# Patient Record
Sex: Female | Born: 1990 | Race: Black or African American | Hispanic: No | Smoking: Former smoker
Health system: Southern US, Community
[De-identification: ages and names within clinical notes are randomized; demographics above are authoritative.]

## PROBLEM LIST (undated history)

## (undated) DIAGNOSIS — E668 Other obesity: Secondary | ICD-10-CM

## (undated) DIAGNOSIS — G4733 Obstructive sleep apnea (adult) (pediatric): Secondary | ICD-10-CM

## (undated) DIAGNOSIS — F99 Mental disorder, not otherwise specified: Secondary | ICD-10-CM

## (undated) DIAGNOSIS — J45909 Unspecified asthma, uncomplicated: Secondary | ICD-10-CM

## (undated) DIAGNOSIS — E1165 Type 2 diabetes mellitus with hyperglycemia: Secondary | ICD-10-CM

## (undated) DIAGNOSIS — E669 Obesity, unspecified: Secondary | ICD-10-CM

## (undated) DIAGNOSIS — I10 Essential (primary) hypertension: Secondary | ICD-10-CM

## (undated) DIAGNOSIS — Z8489 Family history of other specified conditions: Secondary | ICD-10-CM

## (undated) DIAGNOSIS — IMO0002 Reserved for concepts with insufficient information to code with codable children: Secondary | ICD-10-CM

## (undated) DIAGNOSIS — F329 Major depressive disorder, single episode, unspecified: Secondary | ICD-10-CM

## (undated) DIAGNOSIS — F32A Depression, unspecified: Secondary | ICD-10-CM

## (undated) HISTORY — PX: APPENDECTOMY: SHX54

## (undated) HISTORY — PX: TONSILLECTOMY: SUR1361

---
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
## 2014-09-04 ENCOUNTER — Emergency Department (HOSPITAL_COMMUNITY): Payer: Self-pay

## 2014-09-04 ENCOUNTER — Encounter (HOSPITAL_COMMUNITY): Payer: Self-pay | Admitting: *Deleted

## 2014-09-04 ENCOUNTER — Inpatient Hospital Stay (HOSPITAL_COMMUNITY)
Admission: EM | Admit: 2014-09-04 | Discharge: 2014-09-06 | DRG: 418 | Disposition: A | Discharge status: Home / Self Care | Payer: Self-pay | Attending: General Surgery | Admitting: General Surgery

## 2014-09-04 DIAGNOSIS — E1165 Type 2 diabetes mellitus with hyperglycemia: Secondary | ICD-10-CM | POA: Diagnosis present

## 2014-09-04 DIAGNOSIS — E668 Other obesity: Secondary | ICD-10-CM | POA: Diagnosis present

## 2014-09-04 DIAGNOSIS — F329 Major depressive disorder, single episode, unspecified: Secondary | ICD-10-CM | POA: Diagnosis present

## 2014-09-04 DIAGNOSIS — K8044 Calculus of bile duct with chronic cholecystitis without obstruction: Principal | ICD-10-CM | POA: Diagnosis present

## 2014-09-04 DIAGNOSIS — E669 Obesity, unspecified: Secondary | ICD-10-CM | POA: Diagnosis present

## 2014-09-04 DIAGNOSIS — K8050 Calculus of bile duct without cholangitis or cholecystitis without obstruction: Secondary | ICD-10-CM | POA: Diagnosis present

## 2014-09-04 DIAGNOSIS — J45909 Unspecified asthma, uncomplicated: Secondary | ICD-10-CM | POA: Diagnosis present

## 2014-09-04 DIAGNOSIS — Z9049 Acquired absence of other specified parts of digestive tract: Secondary | ICD-10-CM | POA: Diagnosis present

## 2014-09-04 DIAGNOSIS — IMO0002 Reserved for concepts with insufficient information to code with codable children: Secondary | ICD-10-CM | POA: Diagnosis present

## 2014-09-04 DIAGNOSIS — G4733 Obstructive sleep apnea (adult) (pediatric): Secondary | ICD-10-CM | POA: Diagnosis present

## 2014-09-04 DIAGNOSIS — Z6841 Body Mass Index (BMI) 40.0 and over, adult: Secondary | ICD-10-CM

## 2014-09-04 HISTORY — DX: Unspecified asthma, uncomplicated: J45.909

## 2014-09-04 HISTORY — DX: Type 2 diabetes mellitus with hyperglycemia: E11.65

## 2014-09-04 HISTORY — DX: Obstructive sleep apnea (adult) (pediatric): G47.33

## 2014-09-04 HISTORY — DX: Other obesity: E66.8

## 2014-09-04 HISTORY — DX: Obesity, unspecified: E66.9

## 2014-09-04 HISTORY — DX: Reserved for concepts with insufficient information to code with codable children: IMO0002

## 2014-09-04 LAB — URINALYSIS, ROUTINE W REFLEX MICROSCOPIC
Glucose, UA: NEGATIVE mg/dL
Ketones, ur: NEGATIVE mg/dL
Leukocytes, UA: NEGATIVE
Nitrite: NEGATIVE
Protein, ur: 100 mg/dL — AB
Specific Gravity, Urine: 1.029 (ref 1.005–1.030)
Urobilinogen, UA: 0.2 mg/dL (ref 0.0–1.0)
pH: 5.5 (ref 5.0–8.0)

## 2014-09-04 LAB — COMPREHENSIVE METABOLIC PANEL
ALT: 25 U/L (ref 0–35)
AST: 19 U/L (ref 0–37)
Albumin: 3.1 g/dL — ABNORMAL LOW (ref 3.5–5.2)
Alkaline Phosphatase: 63 U/L (ref 39–117)
Anion gap: 6 (ref 5–15)
BUN: 6 mg/dL (ref 6–23)
CO2: 29 mmol/L (ref 19–32)
Calcium: 8.8 mg/dL (ref 8.4–10.5)
Chloride: 104 mmol/L (ref 96–112)
Creatinine, Ser: 0.75 mg/dL (ref 0.50–1.10)
GFR calc Af Amer: >90 mL/min (ref >90)
GFR calc non Af Amer: >90 mL/min (ref >90)
Glucose, Bld: 142 mg/dL — ABNORMAL HIGH (ref 70–99)
Potassium: 3.9 mmol/L (ref 3.5–5.1)
Sodium: 139 mmol/L (ref 135–145)
Total Bilirubin: 0.4 mg/dL (ref 0.3–1.2)
Total Protein: 6.7 g/dL (ref 6.0–8.3)

## 2014-09-04 LAB — CBC WITH DIFFERENTIAL/PLATELET
Basophils Absolute: 0 10*3/uL (ref 0.0–0.1)
Basophils Relative: 0 % (ref 0–1)
Eosinophils Absolute: 0 10*3/uL (ref 0.0–0.7)
Eosinophils Relative: 0 % (ref 0–5)
HCT: 35.7 % — ABNORMAL LOW (ref 36.0–46.0)
Hemoglobin: 11.3 g/dL — ABNORMAL LOW (ref 12.0–15.0)
Lymphocytes Relative: 24 % (ref 12–46)
Lymphs Abs: 1.4 10*3/uL (ref 0.7–4.0)
MCH: 28.6 pg (ref 26.0–34.0)
MCHC: 31.7 g/dL (ref 30.0–36.0)
MCV: 90.4 fL (ref 78.0–100.0)
Monocytes Absolute: 0.4 10*3/uL (ref 0.1–1.0)
Monocytes Relative: 6 % (ref 3–12)
Neutro Abs: 4.1 10*3/uL (ref 1.7–7.7)
Neutrophils Relative %: 70 % (ref 43–77)
Platelets: 240 10*3/uL (ref 150–400)
RBC: 3.95 MIL/uL (ref 3.87–5.11)
RDW: 13.8 % (ref 11.5–15.5)
WBC: 5.9 10*3/uL (ref 4.0–10.5)

## 2014-09-04 LAB — SURGICAL PCR SCREEN
MRSA, PCR: NEGATIVE
Staphylococcus aureus: NEGATIVE

## 2014-09-04 LAB — URINE MICROSCOPIC-ADD ON

## 2014-09-04 LAB — LIPASE, BLOOD: Lipase: 21 U/L (ref 11–59)

## 2014-09-04 LAB — POC URINE PREG, ED: Preg Test, Ur: NEGATIVE

## 2014-09-04 MED ORDER — DEXTROSE 5 % IV SOLN
2.0000 g | INTRAVENOUS | Status: DC
  Administered 2014-09-04: 2 g via INTRAVENOUS
  Filled 2014-09-04 (×2): qty 2

## 2014-09-04 MED ORDER — ONDANSETRON HCL 4 MG/2ML IJ SOLN
4.0000 mg | Freq: Four times a day (QID) | INTRAMUSCULAR | Status: DC | PRN
  Administered 2014-09-05 (×2): 4 mg via INTRAVENOUS
  Filled 2014-09-04 (×2): qty 2

## 2014-09-04 MED ORDER — MORPHINE SULFATE 4 MG/ML IJ SOLN
4.0000 mg | Freq: Once | INTRAMUSCULAR | Status: AC
  Administered 2014-09-04: 4 mg via INTRAVENOUS
  Filled 2014-09-04: qty 1

## 2014-09-04 MED ORDER — POTASSIUM CHLORIDE IN NACL 20-0.9 MEQ/L-% IV SOLN
INTRAVENOUS | Status: DC
  Administered 2014-09-04 – 2014-09-06 (×4): via INTRAVENOUS
  Filled 2014-09-04 (×10): qty 1000

## 2014-09-04 MED ORDER — ACETAMINOPHEN 650 MG RE SUPP: 650.0000 mg | Freq: Four times a day (QID) | RECTAL | Status: DC | PRN

## 2014-09-04 MED ORDER — DIPHENHYDRAMINE HCL 50 MG/ML IJ SOLN: 12.5000 mg | Freq: Four times a day (QID) | INTRAMUSCULAR | Status: DC | PRN

## 2014-09-04 MED ORDER — ONDANSETRON HCL 4 MG/2ML IJ SOLN
4.0000 mg | Freq: Once | INTRAMUSCULAR | Status: AC
  Administered 2014-09-04: 4 mg via INTRAVENOUS
  Filled 2014-09-04: qty 2

## 2014-09-04 MED ORDER — DIPHENHYDRAMINE HCL 12.5 MG/5ML PO ELIX: 12.5000 mg | ORAL_SOLUTION | Freq: Four times a day (QID) | ORAL | Status: DC | PRN

## 2014-09-04 MED ORDER — ALBUTEROL SULFATE (2.5 MG/3ML) 0.083% IN NEBU: 3.0000 mL | INHALATION_SOLUTION | Freq: Four times a day (QID) | RESPIRATORY_TRACT | Status: DC | PRN

## 2014-09-04 MED ORDER — MORPHINE SULFATE 2 MG/ML IJ SOLN
1.0000 mg | INTRAMUSCULAR | Status: DC | PRN
  Administered 2014-09-04 – 2014-09-06 (×7): 2 mg via INTRAVENOUS
  Filled 2014-09-04 (×5): qty 1

## 2014-09-04 MED ORDER — ACETAMINOPHEN 325 MG PO TABS
650.0000 mg | ORAL_TABLET | Freq: Four times a day (QID) | ORAL | Status: DC | PRN
  Administered 2014-09-04: 650 mg via ORAL
  Filled 2014-09-04: qty 2

## 2014-09-04 NOTE — ED Notes (Signed)
Pt ambulated to the BR, pt unable to provide enough urine for urine sample will reattempt collection

## 2014-09-04 NOTE — ED Notes (Signed)
Attempted report 

## 2014-09-04 NOTE — H&P (Signed)
Yvette Gibbs 20-Jan-1991  [id number].   Primary Care MD: none Chief Complaint/Reason for Consult: abdominal pain HPI: This is a 24 yo morbidly obese BF who has a BMI of 70.  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  She does not work or go to school.  She states she lives at home with there grandmother and eats all fried food.  She states she can get up and walk around some.  About a week ago she started having some RUQ abdominal pain.  Eating makes her pain worse.  She thinks she ran a fever at home of maybe 100.7.  She has had some nausea, but no diarrhea.  Her pain has persisted.  They called EMS this morning and she was brought to the [facility] where she had an ultrasound that revealed gallstones with some wall thickening.  Her WBC and her LFTs are normal.  We have been asked to see her for admission.  ROS : +SOB when laying flat, +OSA, but cpap is broken, denies chest pain.  Otherwise see HPI, all other systems are negative  History reviewed. No pertinent family history.  Past Medical History  Diagnosis Date  . Mental disorder   . Depression   . Asthma   . OSA (obstructive sleep apnea)     Past Surgical History  Procedure Laterality Date  . Appendectomy    . Tonsillectomy      Social History:  reports that she has never smoked. She has never used smokeless tobacco. She reports that she does not drink alcohol or use illicit drugs.  Allergies: No Known Allergies   (Not in a hospital admission)  Blood pressure 115/69, pulse 96, temperature 99.5 F (37.5 C), temperature source Oral, resp. rate 19, height 5' 1"  (1.549 m), weight 166.612 kg (367 lb 5 oz), last menstrual period 08/27/2014, SpO2 97 %. Physical Exam: General: pleasant, morbidly obese black female who is laying in bed in NAD HEENT: head is normocephalic, atraumatic.  Sclera are noninjected.  PERRL.  Ears and nose without any masses or lesions.  Mouth is pink and  moist Heart: regular, rate, and rhythm.  Normal s1,s2. No obvious murmurs, gallops, or rubs noted.  Palpable radial and pedal pulses bilaterally Lungs: CTAB, no wheezes, rhonchi, or rales noted.  Respiratory effort is mildly labored due to body habitus Abd: soft, tender in RUQ, but no guarding, morbidly obese, +BS, no masses, hernias, or organomegaly MS: all 4 extremities are symmetrical with no cyanosis, clubbing, or edema. Skin: warm and dry with no masses, lesions, or rashes Psych: A&Ox3 with an appropriate affect.    Results for orders placed or performed during the hospital encounter of 09/04/14 (from the past 48 hour(s))  CBC with Differential     Status: Abnormal   Collection Time: 09/04/14  6:45 AM  Result Value Ref Range   WBC 5.9 4.0 - 10.5 K/uL   RBC 3.95 3.87 - 5.11 MIL/uL   Hemoglobin 11.3 (L) 12.0 - 15.0 g/dL   HCT 35.7 (L) 36.0 - 46.0 %   MCV 90.4 78.0 - 100.0 fL   MCH 28.6 26.0 - 34.0 pg   MCHC 31.7 30.0 - 36.0 g/dL   RDW 13.8 11.5 - 15.5 %   Platelets 240 150 - 400 K/uL   Neutrophils Relative % 70 43 - 77 %   Neutro Abs 4.1 1.7 - 7.7 K/uL   Lymphocytes Relative 24 12 - 46 %  Lymphs Abs 1.4 0.7 - 4.0 K/uL   Monocytes Relative 6 3 - 12 %   Monocytes Absolute 0.4 0.1 - 1.0 K/uL   Eosinophils Relative 0 0 - 5 %   Eosinophils Absolute 0.0 0.0 - 0.7 K/uL   Basophils Relative 0 0 - 1 %   Basophils Absolute 0.0 0.0 - 0.1 K/uL  Comprehensive metabolic panel     Status: Abnormal   Collection Time: 09/04/14  6:45 AM  Result Value Ref Range   Sodium 139 135 - 145 mmol/L   Potassium 3.9 3.5 - 5.1 mmol/L   Chloride 104 96 - 112 mmol/L   CO2 29 19 - 32 mmol/L   Glucose, Bld 142 (H) 70 - 99 mg/dL   BUN 6 6 - 23 mg/dL   Creatinine, Ser 0.75 0.50 - 1.10 mg/dL   Calcium 8.8 8.4 - 10.5 mg/dL   Total Protein 6.7 6.0 - 8.3 g/dL   Albumin 3.1 (L) 3.5 - 5.2 g/dL   AST 19 0 - 37 U/L   ALT 25 0 - 35 U/L   Alkaline Phosphatase 63 39 - 117 U/L   Total Bilirubin 0.4 0.3 - 1.2  mg/dL   GFR calc non Af Amer >90 >90 mL/min   GFR calc Af Amer >90 >90 mL/min    Comment: (NOTE) The eGFR has been calculated using the CKD EPI equation. This calculation has not been validated in all clinical situations. eGFR's persistently <90 mL/min signify possible Chronic Kidney Disease.    Anion gap 6 5 - 15  Lipase, blood     Status: None   Collection Time: 09/04/14  6:45 AM  Result Value Ref Range   Lipase 21 11 - 59 U/L  Urinalysis, Routine w reflex microscopic     Status: Abnormal   Collection Time: 09/04/14 10:01 AM  Result Value Ref Range   Color, Urine AMBER (A) YELLOW    Comment: BIOCHEMICALS MAY BE AFFECTED BY COLOR   APPearance CLOUDY (A) CLEAR   Specific Gravity, Urine 1.029 1.005 - 1.030   pH 5.5 5.0 - 8.0   Glucose, UA NEGATIVE NEGATIVE mg/dL   Hgb urine dipstick MODERATE (A) NEGATIVE   Bilirubin Urine SMALL (A) NEGATIVE   Ketones, ur NEGATIVE NEGATIVE mg/dL   Protein, ur 100 (A) NEGATIVE mg/dL   Urobilinogen, UA 0.2 0.0 - 1.0 mg/dL   Nitrite NEGATIVE NEGATIVE   Leukocytes, UA NEGATIVE NEGATIVE  Urine microscopic-add on     Status: Abnormal   Collection Time: 09/04/14 10:01 AM  Result Value Ref Range   Squamous Epithelial / LPF MANY (A) RARE    Comment: LESS THAN 10 mL OF URINE SUBMITTED   WBC, UA 3-6 <3 WBC/hpf   RBC / HPF 3-6 <3 RBC/hpf   Bacteria, UA MANY (A) RARE  POC Urine Pregnancy, ED (do NOT order at [facility])     Status: None   Collection Time: 09/04/14 10:11 AM  Result Value Ref Range   Preg Test, Ur NEGATIVE NEGATIVE    Comment:        THE SENSITIVITY OF THIS METHODOLOGY IS >24 mIU/mL    US Abdomen Complete  09/04/2014   CLINICAL DATA:  Abdominal pain, nausea, vomiting  EXAM: ULTRASOUND ABDOMEN COMPLETE  COMPARISON:  None.  FINDINGS: Gallbladder: Multiple gallstones are noted within gallbladder. The largest measures 6 mm. There is thickening of gallbladder wall up to 4.9 mm. Positive sonographic Murphy sign. Findings are suspicious for acute  cholecystitis. Clinical correlation is necessary.  Common bile duct: Diameter: 3.5 mm in diameter within normal limits.  Liver: No focal lesion identified. Within normal limits in parenchymal echogenicity.  IVC: No abnormality visualized.  Pancreas: Visualized portion unremarkable.  Spleen: Size and appearance within normal limits. Measures 7.7 cm in length.  Right Kidney: Length: 12.4 cm. Echogenicity within normal limits. No mass or hydronephrosis visualized.  Left Kidney: Length: 12.7 cm. Echogenicity within normal limits. No mass or hydronephrosis visualized.  Abdominal aorta: No aneurysm visualized. Measures up to 2.1 cm in diameter.  Other findings: None.  IMPRESSION: 1. Multiple gallstones are noted within gallbladder. The largest measures 6 mm. There is thickening of gallbladder wall up to 4.9 mm. Positive sonographic Murphy sign. Findings are suspicious for acute cholecystitis. Clinical correlation is necessary. 2. Normal CBD. 3. No hydronephrosis. 4. No aortic aneurysm.   Electronically Signed   By: Lahoma Crocker M.D.   On: 09/04/2014 09:29       Assessment/Plan 1. Biliary colic -will admit and plan for lap chole -start on IV Rocephin -keep NPO incase we can proceed later today 2. Hyperglycemia  -will check hgbA1c 3. OSA -will order cpap here 4. Morbidly obese -BMI 70 -d/w the patient that she will need to change her lifestyle or she is at significant risk for early mortality due to her size and other medical problems.  She asked about bariatric surgery.  I told her we can discuss that after we get her gallbladder out.  -case manager consult to assist with obtaining a primary medical doctor.  The patient desperately needs a primary medical doctor to assist her in her medical plans for the future and her care.   Michae Grimley E 09/04/2014, 11:17 AM Pager: [phone]

## 2014-09-04 NOTE — ED Notes (Signed)
Patient presents stating a week ago her stomach started hurting.  Started with nausea and vomiting after eating dinner last night.  States she also noted some "spots of blood" in the emesis

## 2014-09-04 NOTE — ED Provider Notes (Signed)
CSN: [id number]     Arrival date & time 09/04/14  0615 History   First MD Initiated Contact with Patient 09/04/14 0630     Chief Complaint  Patient presents with  . Abdominal Pain  . Nausea  . Emesis     (Consider location/radiation/quality/duration/timing/severity/associated sxs/prior Treatment) HPI Yvette Gibbs is a 24 y.o. female with hx of asthma, presents to ED complaining of nausea, vomiting, abdominal pain. Pt states pain started about a week ago. Pain is constant. Mainly in the upper abdomen. Worsened with eating. States ate some tripe yesterday, since then increased pain, nausea, vomiting. States tried to take ibuprofen last night and drink milk but was unable to keep it down. Pt reports prior appendectomy, no other abdominal surgeries. No urinary symptoms. No vaginal discharge or bleeding. Did not take any medications this morning prior to coming in. No back pain. No fever or chills. No other complaints.     Past Medical History  Diagnosis Date  . Mental disorder   . Depression   . Asthma    Past Surgical History  Procedure Laterality Date  . Appendectomy    . Tonsillectomy     No family history on file. History  Substance Use Topics  . Smoking status: Never Smoker   . Smokeless tobacco: Never Used  . Alcohol Use: No   OB History    No data available     Review of Systems  Constitutional: Negative for fever and chills.  Respiratory: Negative for cough, chest tightness and shortness of breath.   Cardiovascular: Negative for chest pain, palpitations and leg swelling.  Gastrointestinal: Positive for nausea and abdominal pain. Negative for vomiting and diarrhea.  Genitourinary: Negative for dysuria, flank pain, vaginal bleeding, vaginal discharge, vaginal pain and pelvic pain.  Musculoskeletal: Negative for myalgias, arthralgias, neck pain and neck stiffness.  Skin: Negative for rash.  Neurological: Negative for dizziness, weakness and headaches.  All other  systems reviewed and are negative.     Allergies  Review of patient's allergies indicates no known allergies.  Home Medications   Prior to Admission medications   Medication Sig Start Date End Date Taking? Authorizing Provider  ARIPiprazole (ABILIFY) 10 MG tablet Take 10 mg by mouth daily.      Historical Provider, MD  OXcarbazepine (TRILEPTAL) 150 MG tablet Take 150 mg by mouth 2 (two) times daily.      Historical Provider, MD   BP 126/63 mmHg  Pulse 90  Temp(Src) 99.5 F (37.5 C) (Oral)  Resp 21  Ht  (1.549 m)  SpO2 95%  LMP 08/27/2014 Physical Exam  Constitutional: She is oriented to person, place, and time. She appears well-developed and well-nourished. No distress.  HENT:  Head: Normocephalic.  Eyes: Conjunctivae are normal.  Neck: Neck supple.  Cardiovascular: Normal rate, regular rhythm and normal heart sounds.   Pulmonary/Chest: Effort normal and breath sounds normal. No respiratory distress. She has no wheezes. She has no rales.  Abdominal: Soft. Bowel sounds are normal. She exhibits no distension. There is tenderness. There is no rebound.  RUQ tenderness  Musculoskeletal: She exhibits no edema.  Neurological: She is alert and oriented to person, place, and time.  Skin: Skin is warm and dry.  Psychiatric: She has a normal mood and affect. Her behavior is normal.  Nursing note and vitals reviewed.   ED Course  Procedures (including critical care time) Labs Review Labs Reviewed  CBC WITH DIFFERENTIAL/PLATELET - Abnormal; Notable for the following:    Hemoglobin  11.3 (*)    HCT 35.7 (*)    All other components within normal limits  COMPREHENSIVE METABOLIC PANEL - Abnormal; Notable for the following:    Glucose, Bld 142 (*)    Albumin 3.1 (*)    All other components within normal limits  URINALYSIS, ROUTINE W REFLEX MICROSCOPIC - Abnormal; Notable for the following:    Color, Urine AMBER (*)    APPearance CLOUDY (*)    Hgb urine dipstick MODERATE (*)     Bilirubin Urine SMALL (*)    Protein, ur 100 (*)    All other components within normal limits  URINE MICROSCOPIC-ADD ON - Abnormal; Notable for the following:    Squamous Epithelial / LPF MANY (*)    Bacteria, UA MANY (*)    All other components within normal limits  LIPASE, BLOOD  HEMOGLOBIN A1C  POC URINE PREG, ED    Imaging Review [place] Abdomen Complete  09/04/2014   CLINICAL DATA:  Abdominal pain, nausea, vomiting  EXAM: ULTRASOUND ABDOMEN COMPLETE  COMPARISON:  None.  FINDINGS: Gallbladder: Multiple gallstones are noted within gallbladder. The largest measures 6 mm. There is thickening of gallbladder wall up to 4.9 mm. Positive sonographic Murphy sign. Findings are suspicious for acute cholecystitis. Clinical correlation is necessary.  Common bile duct: Diameter: 3.5 mm in diameter within normal limits.  Liver: No focal lesion identified. Within normal limits in parenchymal echogenicity.  IVC: No abnormality visualized.  Pancreas: Visualized portion unremarkable.  Spleen: Size and appearance within normal limits. Measures 7.7 cm in length.  Right Kidney: Length: 12.4 cm. Echogenicity within normal limits. No mass or hydronephrosis visualized.  Left Kidney: Length: 12.7 cm. Echogenicity within normal limits. No mass or hydronephrosis visualized.  Abdominal aorta: No aneurysm visualized. Measures up to 2.1 cm in diameter.  Other findings: None.  IMPRESSION: 1. Multiple gallstones are noted within gallbladder. The largest measures 6 mm. There is thickening of gallbladder wall up to 4.9 mm. Positive sonographic Murphy sign. Findings are suspicious for acute cholecystitis. Clinical correlation is necessary. 2. Normal CBD. 3. No hydronephrosis. 4. No aortic aneurysm.   Electronically Signed   By: Natasha MeadLiviu  Pop M.D.   On: 09/04/2014 09:29     EKG Interpretation None      MDM   Final diagnoses:  Biliary colic    Pt with intermittent RUQ pain, now constant with nausea and vomiting. Will get  labs, US abd pelvis for further evaluation.   Pt's labs unremarkable. US showing gallstones with some gallbladder wall thickening and sonographic murphy's sign.  Discussed with general surgery, given pain only mildly improved with pain meds. Pain still 9/10. They will see and admit pt.   Filed Vitals:   09/04/14 1330 09/04/14 1347 09/04/14 1400 09/04/14 1445  BP: 141/68 141/68 113/83 121/74  Pulse: 96 87 87 93  Temp:      TempSrc:      Resp: 19 18 17 19   Height:      Weight:      SpO2: 98% 99% 99% 99%       Jaynie Crumbleatyana Jenniferlynn Saad, PA-C 09/04/14 1533  Loren Raceravid Yelverton, MD 09/07/14 2307

## 2014-09-04 NOTE — ED Notes (Signed)
Left message for svc response for clear liquid diet tray for pt.

## 2014-09-04 NOTE — ED Notes (Signed)
Patient states 730pm last night she ate some tripe and since then has had an upset stomach

## 2014-09-05 ENCOUNTER — Encounter (HOSPITAL_COMMUNITY): Admission: EM | Disposition: A | Discharge status: Home / Self Care | Payer: Self-pay | Source: Home / Self Care

## 2014-09-05 ENCOUNTER — Inpatient Hospital Stay (HOSPITAL_COMMUNITY): Payer: Self-pay | Admitting: Certified Registered"

## 2014-09-05 ENCOUNTER — Inpatient Hospital Stay (HOSPITAL_COMMUNITY): Payer: Medicaid Other | Admitting: Certified Registered"

## 2014-09-05 ENCOUNTER — Encounter (HOSPITAL_COMMUNITY): Payer: Self-pay | Admitting: Certified Registered"

## 2014-09-05 HISTORY — PX: CHOLECYSTECTOMY: SHX55

## 2014-09-05 LAB — HEMOGLOBIN A1C
Hgb A1c MFr Bld: 7.8 % — ABNORMAL HIGH (ref 4.8–5.6)
Mean Plasma Glucose: 177 mg/dL

## 2014-09-05 SURGERY — LAPAROSCOPIC CHOLECYSTECTOMY
Anesthesia: General | Site: Abdomen

## 2014-09-05 MED ORDER — ENOXAPARIN SODIUM 40 MG/0.4ML ~~LOC~~ SOLN
40.0000 mg | SUBCUTANEOUS | Status: DC
  Administered 2014-09-06: 40 mg via SUBCUTANEOUS
  Filled 2014-09-05: qty 0.4

## 2014-09-05 MED ORDER — SODIUM CHLORIDE 0.9 % IR SOLN
Status: DC | PRN
  Administered 2014-09-05: 1000 mL

## 2014-09-05 MED ORDER — ROCURONIUM BROMIDE 100 MG/10ML IV SOLN
INTRAVENOUS | Status: DC | PRN
  Administered 2014-09-05: 30 mg via INTRAVENOUS
  Administered 2014-09-05: 10 mg via INTRAVENOUS

## 2014-09-05 MED ORDER — OXYCODONE HCL 5 MG PO TABS: 5.0000 mg | ORAL_TABLET | Freq: Once | ORAL | Status: DC | PRN

## 2014-09-05 MED ORDER — DEXAMETHASONE SODIUM PHOSPHATE 4 MG/ML IJ SOLN
INTRAMUSCULAR | Status: DC | PRN
  Administered 2014-09-05: 8 mg via INTRAVENOUS

## 2014-09-05 MED ORDER — PROPOFOL 10 MG/ML IV BOLUS
INTRAVENOUS | Status: AC
  Filled 2014-09-05: qty 20

## 2014-09-05 MED ORDER — SCOPOLAMINE 1 MG/3DAYS TD PT72
MEDICATED_PATCH | TRANSDERMAL | Status: AC
  Administered 2014-09-05: 1.5 mg via TRANSDERMAL
  Filled 2014-09-05: qty 1

## 2014-09-05 MED ORDER — POLYETHYLENE GLYCOL 3350 17 G PO PACK: 17.0000 g | PACK | Freq: Every day | ORAL | Status: DC | PRN

## 2014-09-05 MED ORDER — GLYCOPYRROLATE 0.2 MG/ML IJ SOLN
INTRAMUSCULAR | Status: AC
  Filled 2014-09-05: qty 2

## 2014-09-05 MED ORDER — PROMETHAZINE HCL 25 MG/ML IJ SOLN: 6.2500 mg | INTRAMUSCULAR | Status: DC | PRN

## 2014-09-05 MED ORDER — PNEUMOCOCCAL VAC POLYVALENT 25 MCG/0.5ML IJ INJ
0.5000 mL | INJECTION | INTRAMUSCULAR | Status: AC
  Administered 2014-09-06: 0.5 mL via INTRAMUSCULAR

## 2014-09-05 MED ORDER — PROPOFOL 10 MG/ML IV BOLUS
INTRAVENOUS | Status: DC | PRN
  Administered 2014-09-05: 200 mg via INTRAVENOUS

## 2014-09-05 MED ORDER — FENTANYL CITRATE 0.05 MG/ML IJ SOLN
INTRAMUSCULAR | Status: AC
  Filled 2014-09-05: qty 5

## 2014-09-05 MED ORDER — LACTATED RINGERS IV SOLN
INTRAVENOUS | Status: DC
  Administered 2014-09-05 (×2): via INTRAVENOUS

## 2014-09-05 MED ORDER — HYDROMORPHONE HCL 1 MG/ML IJ SOLN
INTRAMUSCULAR | Status: AC
  Filled 2014-09-05: qty 1

## 2014-09-05 MED ORDER — LIDOCAINE HCL (CARDIAC) 20 MG/ML IV SOLN
INTRAVENOUS | Status: AC
  Filled 2014-09-05: qty 5

## 2014-09-05 MED ORDER — 0.9 % SODIUM CHLORIDE (POUR BTL) OPTIME
TOPICAL | Status: DC | PRN
  Administered 2014-09-05: 1000 mL

## 2014-09-05 MED ORDER — HYDROMORPHONE HCL 1 MG/ML IJ SOLN
0.2500 mg | INTRAMUSCULAR | Status: DC | PRN
  Administered 2014-09-05: 0.25 mg via INTRAVENOUS

## 2014-09-05 MED ORDER — SUCCINYLCHOLINE CHLORIDE 20 MG/ML IJ SOLN
INTRAMUSCULAR | Status: DC | PRN
  Administered 2014-09-05: 14 mg via INTRAVENOUS

## 2014-09-05 MED ORDER — DEXAMETHASONE SODIUM PHOSPHATE 4 MG/ML IJ SOLN
INTRAMUSCULAR | Status: AC
  Filled 2014-09-05: qty 2

## 2014-09-05 MED ORDER — DOCUSATE SODIUM 100 MG PO CAPS
100.0000 mg | ORAL_CAPSULE | Freq: Two times a day (BID) | ORAL | Status: DC
  Administered 2014-09-05 – 2014-09-06 (×3): 100 mg via ORAL
  Filled 2014-09-05 (×2): qty 1

## 2014-09-05 MED ORDER — SODIUM CHLORIDE 0.9 % IJ SOLN
INTRAMUSCULAR | Status: AC
  Filled 2014-09-05: qty 10

## 2014-09-05 MED ORDER — ROCURONIUM BROMIDE 50 MG/5ML IV SOLN
INTRAVENOUS | Status: AC
  Filled 2014-09-05: qty 1

## 2014-09-05 MED ORDER — NEOSTIGMINE METHYLSULFATE 10 MG/10ML IV SOLN
INTRAVENOUS | Status: AC
  Filled 2014-09-05: qty 1

## 2014-09-05 MED ORDER — MIDAZOLAM HCL 5 MG/5ML IJ SOLN
INTRAMUSCULAR | Status: DC | PRN
  Administered 2014-09-05: 2 mg via INTRAVENOUS

## 2014-09-05 MED ORDER — ONDANSETRON HCL 4 MG/2ML IJ SOLN
INTRAMUSCULAR | Status: DC | PRN
  Administered 2014-09-05: 4 mg via INTRAVENOUS

## 2014-09-05 MED ORDER — EPHEDRINE SULFATE 50 MG/ML IJ SOLN
INTRAMUSCULAR | Status: AC
  Filled 2014-09-05: qty 1

## 2014-09-05 MED ORDER — BUPIVACAINE HCL 0.25 % IJ SOLN
INTRAMUSCULAR | Status: DC | PRN
  Administered 2014-09-05: 1 mL
  Administered 2014-09-05: 6 mL

## 2014-09-05 MED ORDER — SCOPOLAMINE 1 MG/3DAYS TD PT72
1.0000 | MEDICATED_PATCH | TRANSDERMAL | Status: DC
  Administered 2014-09-05: 1.5 mg via TRANSDERMAL

## 2014-09-05 MED ORDER — NEOSTIGMINE METHYLSULFATE 10 MG/10ML IV SOLN
INTRAVENOUS | Status: DC | PRN
  Administered 2014-09-05: 4 mg via INTRAVENOUS

## 2014-09-05 MED ORDER — BUPIVACAINE HCL (PF) 0.25 % IJ SOLN
INTRAMUSCULAR | Status: AC
  Filled 2014-09-05: qty 30

## 2014-09-05 MED ORDER — LIDOCAINE HCL (CARDIAC) 20 MG/ML IV SOLN
INTRAVENOUS | Status: DC | PRN
  Administered 2014-09-05: 100 mg via INTRAVENOUS

## 2014-09-05 MED ORDER — GLYCOPYRROLATE 0.2 MG/ML IJ SOLN
INTRAMUSCULAR | Status: DC | PRN
  Administered 2014-09-05: 0.6 mg via INTRAVENOUS

## 2014-09-05 MED ORDER — ONDANSETRON HCL 4 MG/2ML IJ SOLN
INTRAMUSCULAR | Status: AC
  Filled 2014-09-05: qty 2

## 2014-09-05 MED ORDER — HYDROCODONE-ACETAMINOPHEN 5-325 MG PO TABS
1.0000 | ORAL_TABLET | ORAL | Status: DC | PRN
  Administered 2014-09-05 – 2014-09-06 (×3): 2 via ORAL
  Filled 2014-09-05 (×3): qty 2

## 2014-09-05 MED ORDER — FENTANYL CITRATE 0.05 MG/ML IJ SOLN
INTRAMUSCULAR | Status: DC | PRN
  Administered 2014-09-05: 100 ug via INTRAVENOUS
  Administered 2014-09-05: 50 ug via INTRAVENOUS

## 2014-09-05 MED ORDER — OXYCODONE HCL 5 MG/5ML PO SOLN: 5.0000 mg | Freq: Once | ORAL | Status: DC | PRN

## 2014-09-05 MED ORDER — MIDAZOLAM HCL 2 MG/2ML IJ SOLN
INTRAMUSCULAR | Status: AC
Start: 2014-09-05 — End: 2014-09-05
  Filled 2014-09-05: qty 2

## 2014-09-05 MED ORDER — MORPHINE SULFATE 4 MG/ML IJ SOLN
INTRAMUSCULAR | Status: AC
  Filled 2014-09-05: qty 1

## 2014-09-05 SURGICAL SUPPLY — 45 items
BENZOIN TINCTURE PRP APPL 2/3 (GAUZE/BANDAGES/DRESSINGS) ×2 IMPLANT
CANISTER SUCTION 2500CC (MISCELLANEOUS) ×2 IMPLANT
CHLORAPREP W/TINT 26ML (MISCELLANEOUS) ×2 IMPLANT
CLIP LIGATING HEMO O LOK GREEN (MISCELLANEOUS) ×4 IMPLANT
COVER SURGICAL LIGHT HANDLE (MISCELLANEOUS) ×2 IMPLANT
COVER TRANSDUCER ULTRASND (DRAPES) ×2 IMPLANT
DEVICE TROCAR PUNCTURE CLOSURE (ENDOMECHANICALS) ×2 IMPLANT
DRAPE LAPAROSCOPIC ABDOMINAL (DRAPES) ×2 IMPLANT
ELECT REM PT RETURN 9FT ADLT (ELECTROSURGICAL) ×2
ELECTRODE REM PT RTRN 9FT ADLT (ELECTROSURGICAL) ×1 IMPLANT
GAUZE SPONGE 2X2 8PLY STRL LF (GAUZE/BANDAGES/DRESSINGS) ×1 IMPLANT
GLOVE BIO SURGEON STRL SZ7.5 (GLOVE) ×2 IMPLANT
GLOVE BIOGEL PI IND STRL 7.0 (GLOVE) ×2 IMPLANT
GLOVE BIOGEL PI INDICATOR 7.0 (GLOVE) ×2
GLOVE ECLIPSE 7.5 STRL STRAW (GLOVE) ×2 IMPLANT
GLOVE SS N UNI LF 7.0 STRL (GLOVE) ×2 IMPLANT
GOWN STRL REUS W/ TWL LRG LVL3 (GOWN DISPOSABLE) ×2 IMPLANT
GOWN STRL REUS W/ TWL XL LVL3 (GOWN DISPOSABLE) ×1 IMPLANT
GOWN STRL REUS W/TWL LRG LVL3 (GOWN DISPOSABLE) ×2
GOWN STRL REUS W/TWL XL LVL3 (GOWN DISPOSABLE) ×1
HOVERMATT SINGLE USE (MISCELLANEOUS) ×2 IMPLANT
KIT BASIN OR (CUSTOM PROCEDURE TRAY) ×2 IMPLANT
KIT ROOM TURNOVER OR (KITS) ×2 IMPLANT
NEEDLE INSUFFLATION 14GA 120MM (NEEDLE) ×2 IMPLANT
NS IRRIG 1000ML POUR BTL (IV SOLUTION) ×2 IMPLANT
PAD ARMBOARD 7.5X6 YLW CONV (MISCELLANEOUS) ×2 IMPLANT
POUCH RETRIEVAL ECOSAC 10 (ENDOMECHANICALS) ×1 IMPLANT
POUCH RETRIEVAL ECOSAC 10MM (ENDOMECHANICALS) ×1
POUCH SPECIMEN RETRIEVAL 10MM (ENDOMECHANICALS) IMPLANT
SCISSORS LAP 5X35 DISP (ENDOMECHANICALS) ×2 IMPLANT
SET IRRIG TUBING LAPAROSCOPIC (IRRIGATION / IRRIGATOR) ×2 IMPLANT
SLEEVE ENDOPATH XCEL 5M (ENDOMECHANICALS) IMPLANT
SPECIMEN JAR SMALL (MISCELLANEOUS) ×2 IMPLANT
SPONGE GAUZE 2X2 STER 10/PKG (GAUZE/BANDAGES/DRESSINGS) ×1
STRIP CLOSURE SKIN 1/2X4 (GAUZE/BANDAGES/DRESSINGS) ×2 IMPLANT
SUT MNCRL AB 3-0 PS2 18 (SUTURE) ×2 IMPLANT
TAPE CLOTH SURG 4X10 WHT LF (GAUZE/BANDAGES/DRESSINGS) ×2 IMPLANT
TOWEL OR 17X24 6PK STRL BLUE (TOWEL DISPOSABLE) ×2 IMPLANT
TOWEL OR 17X26 10 PK STRL BLUE (TOWEL DISPOSABLE) ×2 IMPLANT
TRAY LAPAROSCOPIC (CUSTOM PROCEDURE TRAY) ×2 IMPLANT
TROCAR 12M 150ML BLUNT (TROCAR) ×2 IMPLANT
TROCAR 5M 150ML BLDLS (TROCAR) ×6 IMPLANT
TROCAR XCEL NON-BLD 11X100MML (ENDOMECHANICALS) IMPLANT
TROCAR XCEL NON-BLD 5MMX100MML (ENDOMECHANICALS) IMPLANT
TUBING INSUFFLATION (TUBING) ×2 IMPLANT

## 2014-09-05 NOTE — Progress Notes (Signed)
RT set up and placed CPAP on patient with pressure of 12 cmH2O. Patient wearing a large full face mask and tolerating well at this time.

## 2014-09-05 NOTE — Op Note (Signed)
09/05/2014  11:11 AM  PATIENT:  Yvette Gibbs  24 y.o. female  PRE-OPERATIVE DIAGNOSIS:  Biliary colic  POST-OPERATIVE DIAGNOSIS:  Biliary colic  PROCEDURE:  Procedure(s): LAPAROSCOPIC CHOLECYSTECTOMY (N/A)  SURGEON:  Surgeon(s) and Role:    * Axel FillerArmando Luka Stohr, MD - Primary  ANESTHESIA:   local and general  EBL:  Total I/O In: 1000 [I.V.:1000] Out: -   BLOOD ADMINISTERED:none  DRAINS: none   LOCAL MEDICATIONS USED:  BUPIVICAINE   SPECIMEN:  Source of Specimen:  Gallbladder  DISPOSITION OF SPECIMEN:  PATHOLOGY  COUNTS:  YES  TOURNIQUET:  * No tourniquets in log *  DICTATION: .Dragon Dictation The patient was taken to the operating and placed in the supine position with bilateral SCDs in place. The patient was prepped and draped in the usual sterile fashion. A time out was called and all facts were verified. A pneumoperitoneum was obtained via A Veress needle technique to a pressure of 14mm of mercury.  A 5mm trochar was then placed in the right upper quadrant under visualization, and there were no injuries to any abdominal organs. A 11 mm port was then placed in the umbilical region after infiltrating with local anesthesia under direct visualization. A second and third epigastric port and right lower quadrant port placement under direct visualization, respectively. The liver was very fatty and large. The gallbladder was identified and retracted, the peritoneum was then sharply dissected from the gallbladder and this dissection was carried down to Calot's triangle. The gallbladder was identified and stripped away circumferentially and seen going into the gallbladder 360, the critical angle was obtained.  2 clips were placed proximally one distally and the cystic duct transected. The cystic artery was identified and 2 clips placed proximally and one distally and transected. We then proceeded to remove the gallbladder off the hepatic fossa with Bovie cautery. A retrieval bag  was then placed in the abdomen and gallbladder placed in the bag. The hepatic fossa was then reexamined and hemostasis was achieved with Bovie cautery and was excellent at the end of the case. The subhepatic fossa and perihepatic fossa was then irrigated until the effluent was clear. The 11 mm trocar fascia was reapproximated with the Endo Close #1 Vicrylx1. The pneumoperitoneum was evacuated and all trochars removed under direct visulalization. The skin was then closed with 4-0 Monocryl and the skin dressed with Steri-Strips, gauze, and tape. The patient was awaken from general anesthesia and taken to the recovery room in stable condition.   PLAN OF CARE: Admit for overnight observation  PATIENT DISPOSITION:  PACU - hemodynamically stable.   Delay start of Pharmacological VTE agent (>24hrs) due to surgical blood loss or risk of bleeding: not applicable

## 2014-09-05 NOTE — Anesthesia Procedure Notes (Signed)
Procedure Name: Intubation Date/Time: 09/05/2014 10:04 AM Performed by: Jerilee HohMUMM, Nora Rooke N Pre-anesthesia Checklist: Patient identified, Emergency Drugs available, Suction available and Patient being monitored Patient Re-evaluated:Patient Re-evaluated prior to inductionOxygen Delivery Method: Circle system utilized Preoxygenation: Pre-oxygenation with 100% oxygen Intubation Type: IV induction Ventilation: Unable to mask ventilate Tube type: Oral Tube size: 7.0 mm Number of attempts: 1 Airway Equipment and Method: Video-laryngoscopy and Stylet Placement Confirmation: ETT inserted through vocal cords under direct vision,  positive ETCO2 and breath sounds checked- equal and bilateral Secured at: 21 cm Tube secured with: Tape Dental Injury: Teeth and Oropharynx as per pre-operative assessment  Difficulty Due To: Difficulty was anticipated, Difficult Airway- due to large tongue and Difficult Airway- due to limited oral opening Future Recommendations: Recommend- induction with short-acting agent, and alternative techniques readily available Comments: Used blankets under patients shoulders to create ramp and place into sniffing position. Unable to mask ventilate. Succinylcholine used for intubation. DL with Glidescope. Grade I view with Glidescope. Atraumatic oral intubation.

## 2014-09-05 NOTE — Progress Notes (Signed)
Spoke with pt about wearing her CPAP, but pt stated she was waiting for dinner. Told her to call when ready. CPAP in room.

## 2014-09-05 NOTE — Transfer of Care (Signed)
Immediate Anesthesia Transfer of Care Note  Patient: Yvette Gibbs  Procedure(s) Performed: Procedure(s): LAPAROSCOPIC CHOLECYSTECTOMY (N/A)  Patient Location: PACU  Anesthesia Type:General  Level of Consciousness: awake, alert , oriented and patient cooperative  Airway & Oxygen Therapy: Patient Spontanous Breathing and Patient connected to face mask oxygen  Post-op Assessment: Report given to RN, Post -op Vital signs reviewed and stable and Patient moving all extremities  Post vital signs: Reviewed and stable  Last Vitals:  Filed Vitals:   09/05/14 0504  BP: 138/83  Pulse: 94  Temp: 37.2 C  Resp: 20    Complications: No apparent anesthesia complications

## 2014-09-05 NOTE — Anesthesia Postprocedure Evaluation (Signed)
Anesthesia Post Note  Patient: Yvette Gibbs  Procedure(s) Performed: Procedure(s) (LRB): LAPAROSCOPIC CHOLECYSTECTOMY (N/A)  Anesthesia type: general  Patient location: PACU  Post pain: Pain level controlled  Post assessment: Patient's Cardiovascular Status Stable  Last Vitals:  Filed Vitals:   09/05/14 1230  BP: 166/90  Pulse:   Temp: 37.2 C  Resp:     Post vital signs: Reviewed and stable  Level of consciousness: sedated  Complications: No apparent anesthesia complications

## 2014-09-05 NOTE — Anesthesia Preprocedure Evaluation (Signed)
Anesthesia Evaluation  Patient identified by MRN, date of birth, ID band Patient awake    Reviewed: Allergy & Precautions, H&P , NPO status , Patient's Chart, lab work & pertinent test results  Airway Mallampati: III  TM Distance: >3 FB Neck ROM: full    Dental  (+) Teeth Intact, Dental Advidsory Given   Pulmonary neg pulmonary ROS, asthma , sleep apnea ,  breath sounds clear to auscultation        Cardiovascular negative cardio ROS  Rhythm:regular Rate:Normal     Neuro/Psych PSYCHIATRIC DISORDERS negative neurological ROS  negative psych ROS   GI/Hepatic negative GI ROS, Neg liver ROS,   Endo/Other  negative endocrine ROS  Renal/GU negative Renal ROS     Musculoskeletal   Abdominal   Peds  Hematology   Anesthesia Other Findings   Reproductive/Obstetrics negative OB ROS                             Anesthesia Physical Anesthesia Plan  ASA: III  Anesthesia Plan: General ETT   Post-op Pain Management:    Induction:   Airway Management Planned:   Additional Equipment:   Intra-op Plan:   Post-operative Plan:   Informed Consent: I have reviewed the patients History and Physical, chart, labs and discussed the procedure including the risks, benefits and alternatives for the proposed anesthesia with the patient or authorized representative who has indicated his/her understanding and acceptance.   Dental Advisory Given  Plan Discussed with: Anesthesiologist, CRNA and Surgeon  Anesthesia Plan Comments:         Anesthesia Quick Evaluation

## 2014-09-06 ENCOUNTER — Encounter (HOSPITAL_COMMUNITY): Payer: Self-pay | Admitting: General Surgery

## 2014-09-06 DIAGNOSIS — E668 Other obesity: Secondary | ICD-10-CM | POA: Diagnosis present

## 2014-09-06 DIAGNOSIS — IMO0002 Reserved for concepts with insufficient information to code with codable children: Secondary | ICD-10-CM | POA: Diagnosis present

## 2014-09-06 DIAGNOSIS — E1165 Type 2 diabetes mellitus with hyperglycemia: Secondary | ICD-10-CM | POA: Diagnosis present

## 2014-09-06 DIAGNOSIS — E669 Obesity, unspecified: Secondary | ICD-10-CM | POA: Diagnosis present

## 2014-09-06 MED ORDER — HYDROCODONE-ACETAMINOPHEN 5-325 MG PO TABS: 1.0000 | ORAL_TABLET | Freq: Four times a day (QID) | ORAL | Status: DC | PRN

## 2014-09-06 MED ORDER — POLYETHYLENE GLYCOL 3350 17 G PO PACK: 17.0000 g | PACK | Freq: Every day | ORAL | Status: DC | PRN

## 2014-09-06 MED ORDER — LIVING WELL WITH DIABETES BOOK
Freq: Once | Status: AC
  Administered 2014-09-06: 13:00:00
  Filled 2014-09-06: qty 1

## 2014-09-06 MED ORDER — DOCUSATE SODIUM 100 MG PO CAPS: 100.0000 mg | ORAL_CAPSULE | Freq: Two times a day (BID) | ORAL | Status: DC | PRN

## 2014-09-06 NOTE — Discharge Instructions (Signed)
Your appointment is at 3:30pm on 09/25/14, please arrive at least 30 min before your appointment to complete your check in paperwork.  If you are unable to arrive 30 min prior to your appointment time we may have to cancel or reschedule you.  LAPAROSCOPIC SURGERY: POST OP INSTRUCTIONS  1. DIET: Follow a light bland diet the first 24 hours after arrival home, such as soup, liquids, crackers, etc. Be sure to include lots of fluids daily. Avoid fast food or heavy meals as your are more likely to get nauseated. Eat a low fat the next few days after surgery.  2. Take your usually prescribed home medications unless otherwise directed. 3. PAIN CONTROL:  1. Pain is best controlled by a usual combination of three different methods TOGETHER:  1. Ice/Heat 2. Over the counter pain medication 3. Prescription pain medication 2. Most patients will experience some swelling and bruising around the incisions. Ice packs or heating pads (30-60 minutes up to 6 times a day) will help. Use ice for the first few days to help decrease swelling and bruising, then switch to heat to help relax tight/sore spots and speed recovery. Some people prefer to use ice alone, heat alone, alternating between ice & heat. Experiment to what works for you. Swelling and bruising can take several weeks to resolve.  3. It is helpful to take an over-the-counter pain medication regularly for the first few weeks. Choose one of the following that works best for you:  1. Naproxen (Aleve, etc) Two 220mg  tabs twice a day 2. Ibuprofen (Advil, etc) Three 200mg  tabs four times a day (every meal & bedtime) 3. Acetaminophen (Tylenol, etc) 500-650mg  four times a day (every meal & bedtime) 4. A prescription for pain medication (such as oxycodone, hydrocodone, etc) should be given to you upon discharge. Take your pain medication as prescribed.  1. If you are having problems/concerns with the prescription medicine (does not control pain, nausea, vomiting, rash,  itching, etc), please call us [phone] to see if we need to switch you to a different pain medicine that will work better for you and/or control your side effect better. 2. If you need a refill on your pain medication, please contact your pharmacy. They will contact our office to request authorization. Prescriptions will not be filled after 5 pm or on week-ends. 4. Avoid getting constipated. Between the surgery and the pain medications, it is common to experience some constipation. Increasing fluid intake and taking a fiber supplement (such as Metamucil, Citrucel, FiberCon, MiraLax, etc) 1-2 times a day regularly will usually help prevent this problem from occurring. A mild laxative (prune juice, Milk of Magnesia, MiraLax, etc) should be taken according to package directions if there are no bowel movements after 48 hours.  5. Watch out for diarrhea. If you have many loose bowel movements, simplify your diet to bland foods & liquids for a few days. Stop any stool softeners and decrease your fiber supplement. Switching to mild anti-diarrheal medications (Kayopectate, Pepto Bismol) can help. If this worsens or does not improve, please call us. 6. Wash / shower every day. You may shower over the dressings as they are waterproof. Continue to shower over incision(s) after the dressing is off. 7. Remove your waterproof bandages 5 days after surgery. You may leave the incision open to air. You may replace a dressing/Band-Aid to cover the incision for comfort if you wish.  8. ACTIVITIES as tolerated:  1. You may resume regular (light) daily activities beginning the next  day--such as daily self-care, walking, climbing stairs--gradually increasing activities as tolerated. If you can walk 30 minutes without difficulty, it is safe to try more intense activity such as jogging, treadmill, bicycling, low-impact aerobics, swimming, etc. 2. Save the most intensive and strenuous activity for last such as sit-ups, heavy  lifting, contact sports, etc Refrain from any heavy lifting or straining until you are off narcotics for pain control.  3. DO NOT PUSH THROUGH PAIN. Let pain be your guide: If it hurts to do something, don't do it. Pain is your body warning you to avoid that activity for another week until the pain goes down. 4. You may drive when you are no longer taking prescription pain medication, you can comfortably wear a seatbelt, and you can safely maneuver your car and apply brakes. 5. You may have sexual intercourse when it is comfortable.  9. FOLLOW UP in our office  1. Please call [facility] at [phone] to set up an appointment to see your surgeon in the office for a follow-up appointment approximately 2-3 weeks after your surgery. 2. Make sure that you call for this appointment the day you arrive home to insure a convenient appointment time.      10. IF YOU HAVE DISABILITY OR FAMILY LEAVE FORMS, BRING THEM TO THE               OFFICE FOR PROCESSING.   WHEN TO CALL us [phone]:  1. Poor pain control 2. Reactions / problems with new medications (rash/itching, nausea, etc)  3. Fever over 101.5 F (38.5 C) 4. Inability to urinate 5. Nausea and/or vomiting 6. Worsening swelling or bruising 7. Continued bleeding from incision. 8. Increased pain, redness, or drainage from the incision  The clinic staff is available to answer your questions during regular business hours (8:30am-5pm). Please dont hesitate to call and ask to speak to one of our nurses for clinical concerns.  If you have a medical emergency, go to the nearest emergency room or call 911.  A surgeon from [facility] is always on call at the [facility], [place]  [address], [place], [place] [address] ?  MAIN: [phone] ? TOLL FREE: [phone] ?  FAX [phone]  [email or web address]   [facility] and [facility] [phone]  [address] , [address]

## 2014-09-06 NOTE — Progress Notes (Signed)
Inpatient Diabetes Program Recommendations  AACE/ADA: New Consensus Statement on Inpatient Glycemic Control (2013)  Target Ranges:  Prepandial:   less than 140 mg/dL      Peak postprandial:   less than 180 mg/dL (1-2 hours)      Critically ill patients:  140 - 180 mg/dL   Reason for Visit: Diabetes Consult  Diabetes history: None - newly-diagnosed Outpatient Diabetes medications: None Current orders for Inpatient glycemic control: None HgbA1C - 7.8%  Results for Dene GentryCRUDUP, Kerrin (MRN [id number]) as of 09/06/2014 13:25  Ref. Range 09/04/2014 06:42  Hemoglobin A1C Latest Range: 4.8-5.6 % 7.8 (H)  Results for Dene GentryCRUDUP, Yesha (MRN [id number]) as of 09/06/2014 13:25  Ref. Range 09/04/2014 06:45  Glucose Latest Range: 70-99 mg/dL 782142 (H)      Spoke with pt about new diabetes diagnosis. Discussed A1C results and explained what an A1C is, basic pathophysiology of DM Type 2, basic home care, importance of checking CBGs and maintaining good CBG control to prevent long-term and short-term complications.  Have ordered educational booklet. Spoke with pt about new diabetes diagnosis. Discussed A1C results and explained what an A1C is, basic pathophysiology of DM Type 2, basic home care, importance of checking CBGs and maintaining good CBG control to prevent long-term and short-term complications.  Pt needs PCP to manage DM. [place] in [place], [place], but pt lives here now with grandmother. Grandmother has meter that she can use. Stressed importance of weight loss, exercise and stress management. Will order OP Diabetes Education consult for newly-diagnosed DM.  Thank you. Ailene Ardshonda Adler Alton, RD, LDN, CDE Inpatient Diabetes Coordinator [phone]

## 2014-09-06 NOTE — Plan of Care (Signed)
Problem: Food- and Nutrition-Related Knowledge Deficit (NB-1.1) Goal: Nutrition education Formal process to instruct or train a patient/client in a skill or to impart knowledge to help patients/clients voluntarily manage or modify food choices and eating behavior to maintain or improve health. Outcome: Adequate for Discharge RD received a consult for new onset DM. Dietetic Intern provided a handout on "Carbohydrate Counting For People With Diabetes" by Academy of [employer]etics.  Pt is a morbidly obese young female with newly diagnosed DM. During the visit she seemed very sleepy (per RN she is not using the BiPAP apparatus during the night). Per MD notes, she has a developmental disorder/learning disability, therefore education session was focused on basic healthy eating concepts. Current diet at home is heavy in fried foods and sweetened beverages. Encouraged patient to substitute with low calorie beverages, such as diet sodas or  water, whenever possible. Pt reports that grandmother does most of the cooking at home and "loves to fry everything". Pt stated willingness to make some changes, but appears that she has very little support at home. She expressed desire to lose weight and hopes to have bariatric surgery soon. Pt did not appear to grasp the seriousness of her condition or how it affects every day life. Has had no previous education on the topic. Teach back method used. Expect fair compliance.   Tomio Kirk A. [facility] Dietetic Intern 09/06/2014 10:12 AM

## 2014-09-06 NOTE — Care Management Note (Signed)
  Page 1 of 1   09/06/2014     1:59:17 PM CARE MANAGEMENT NOTE 09/06/2014  Patient:  Yvette Gibbs,Yvette Gibbs   Account Number:  [id number]  Date Initiated:  09/06/2014  Documentation initiated by:  Ronny FlurryWILE,Raziah Funnell  Subjective/Objective Assessment:     Action/Plan:   Anticipated DC Date:  09/06/2014   Anticipated DC Plan:  HOME/SELF CARE         Choice offered to / List presented to:             Status of service:   Medicare Important Message given?   (If response is "NO", the following Medicare IM given date fields will be blank) Date Medicare IM given:   Medicare IM given by:   Date Additional Medicare IM given:   Additional Medicare IM given by:    Discharge Disposition:  HOME/SELF CARE  Per UR Regulation:    If discussed at Long Length of Stay Meetings, dates discussed:    Comments:  09-06-14 [facility] information given to patient . explain Walk In Clinic hours . Ronny FlurryHeather Sahiba Granholm RN BSN [phone]

## 2014-09-06 NOTE — Progress Notes (Signed)
Patient discharged home with instructions. 

## 2014-09-06 NOTE — Discharge Summary (Signed)
[facility] Discharge Summary   Patient ID: Yvette GentryChudasama Seib MRN: [id number] DOB/AGE: 1991-02-23 24 y.o.  Admit date: 09/04/2014 Discharge date: 09/06/2014   Admitting Diagnosis: Biliary colic Extreme obesity BMI 70 Diabetes mellitus type 2, uncontrolled   Discharge Diagnosis Patient Active Problem List   Diagnosis Date Noted  . Extreme obesity 09/06/2014  . Diabetes mellitus type 2, uncontrolled 09/06/2014  . Biliary colic 09/04/2014    Consultants None  Imaging: No results found.  Procedures Dr. Derrell Lollingamirez (09/05/14) - [facility]cyst[facility]   Hospital Course:  24 yo morbidly obese BF who has a BMI of 70. 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 She does not work or go to school. She states she lives at home with there grandmother and eats all fried food. She states she can get up and walk around some. About a week ago she started having some RUQ abdominal pain. Eating makes her pain worse. She thinks she ran a fever at home of maybe 100.7. She has had some nausea, but no diarrhea. Her pain has persisted. They called EMS this morning and [facility] that revealed gallstones with some wall thickening. Her WBC and her LFTs are normal. We have been asked to see her for admission.  Patient was admitted and underwent procedure listed above.  Tolerated procedure well and was transferred to the floor.  Diet was advanced as tolerated.  I had the dietitian and diabetes nurse educator see the patient regarding her new onset diabetes.  She has a Hgb A1c at 7.8 and extreme obesity BMI 70.  I talked to her grandmother over the phone and explained her new diagnosis.  We discussed diet/exercise.  She is interested in weight loss surgery, but she will have to prove that she has changed her diet and can lose a considerable amount of weight prior to even being  considered.  I recommend PCP referral first and work on controlling diabetes through lifestyle modifications.  On POD #1, the patient was voiding well, tolerating diet, ambulating well, pain well controlled, vital signs stable, incisions c/d/i and felt stable for discharge home.  Patient will follow up in our office in 3 weeks and knows to call with questions or concerns.     Physical Exam: General:  Largely obese, alert, NAD, pleasant, comfortable Abd:  Soft, ND, mild tenderness, incisions C/D/I     Medication List    TAKE these medications        albuterol 108 (90 BASE) MCG/ACT inhaler  Commonly known as:  PROVENTIL HFA;VENTOLIN HFA  Inhale 2 puffs into the lungs every 6 (six) hours as needed for wheezing or shortness of breath.     docusate sodium 100 MG capsule  Commonly known as:  COLACE  Take 1 capsule (100 mg total) by mouth 2 (two) times daily as needed for mild constipation.     HYDROcodone-acetaminophen 5-325 MG per tablet  Commonly known as:  NORCO/VICODIN  Take 1-2 tablets by mouth every 6 (six) hours as needed for moderate pain or severe pain.     polyethylene glycol packet  Commonly known as:  MIRALAX / GLYCOLAX  Take 17 g by mouth daily as needed for mild constipation or moderate constipation.         Follow-up Information    Follow up with [facility]ICE [place]. Go on 09/25/2014.   Why:  For post-operation check. Your  appointment is at 3:30pm, please arrive at least 30 min before your appointment to complete your check in paperwork.  If you are unable to arrive 30 min prior to your appointment time we may have to cancel or reschedule you   Contact information:   [address] [place] [address] [phone]      Signed: Aris [place], [facility] [phone]  09/06/2014, 9:46 AM

## 2014-09-10 ENCOUNTER — Encounter (HOSPITAL_COMMUNITY): Payer: Self-pay | Admitting: General Surgery

## 2015-09-01 ENCOUNTER — Encounter (HOSPITAL_COMMUNITY): Payer: Self-pay | Admitting: Emergency Medicine

## 2015-09-01 ENCOUNTER — Emergency Department (HOSPITAL_COMMUNITY): Payer: Self-pay

## 2015-09-01 ENCOUNTER — Inpatient Hospital Stay (HOSPITAL_COMMUNITY)
Admission: EM | Admit: 2015-09-01 | Discharge: 2015-09-05 | DRG: 194 | Disposition: A | Discharge status: Home Health | Payer: Self-pay | Attending: Internal Medicine | Admitting: Internal Medicine

## 2015-09-01 DIAGNOSIS — J1000 Influenza due to other identified influenza virus with unspecified type of pneumonia: Secondary | ICD-10-CM | POA: Diagnosis present

## 2015-09-01 DIAGNOSIS — J111 Influenza due to unidentified influenza virus with other respiratory manifestations: Secondary | ICD-10-CM | POA: Diagnosis present

## 2015-09-01 DIAGNOSIS — R0781 Pleurodynia: Secondary | ICD-10-CM | POA: Diagnosis present

## 2015-09-01 DIAGNOSIS — Z23 Encounter for immunization: Secondary | ICD-10-CM

## 2015-09-01 DIAGNOSIS — IMO0002 Reserved for concepts with insufficient information to code with codable children: Secondary | ICD-10-CM | POA: Diagnosis present

## 2015-09-01 DIAGNOSIS — R059 Cough, unspecified: Secondary | ICD-10-CM

## 2015-09-01 DIAGNOSIS — R05 Cough: Secondary | ICD-10-CM

## 2015-09-01 DIAGNOSIS — E119 Type 2 diabetes mellitus without complications: Secondary | ICD-10-CM | POA: Diagnosis present

## 2015-09-01 DIAGNOSIS — E1165 Type 2 diabetes mellitus with hyperglycemia: Secondary | ICD-10-CM | POA: Diagnosis present

## 2015-09-01 DIAGNOSIS — J45901 Unspecified asthma with (acute) exacerbation: Secondary | ICD-10-CM | POA: Diagnosis present

## 2015-09-01 DIAGNOSIS — G4733 Obstructive sleep apnea (adult) (pediatric): Secondary | ICD-10-CM | POA: Diagnosis present

## 2015-09-01 DIAGNOSIS — R079 Chest pain, unspecified: Secondary | ICD-10-CM

## 2015-09-01 DIAGNOSIS — I4510 Unspecified right bundle-branch block: Secondary | ICD-10-CM | POA: Diagnosis present

## 2015-09-01 DIAGNOSIS — Z6841 Body Mass Index (BMI) 40.0 and over, adult: Secondary | ICD-10-CM

## 2015-09-01 DIAGNOSIS — J189 Pneumonia, unspecified organism: Secondary | ICD-10-CM

## 2015-09-01 DIAGNOSIS — J101 Influenza due to other identified influenza virus with other respiratory manifestations: Principal | ICD-10-CM | POA: Diagnosis present

## 2015-09-01 DIAGNOSIS — R0600 Dyspnea, unspecified: Secondary | ICD-10-CM | POA: Insufficient documentation

## 2015-09-01 HISTORY — DX: Family history of other specified conditions: Z84.89

## 2015-09-01 LAB — HEPATIC FUNCTION PANEL
ALT: 27 U/L (ref 14–54)
AST: 26 U/L (ref 15–41)
Albumin: 3 g/dL — ABNORMAL LOW (ref 3.5–5.0)
Alkaline Phosphatase: 66 U/L (ref 38–126)
Bilirubin, Direct: <0.1 mg/dL — ABNORMAL LOW (ref 0.1–0.5)
Total Bilirubin: 0.5 mg/dL (ref 0.3–1.2)
Total Protein: 7.2 g/dL (ref 6.5–8.1)

## 2015-09-01 LAB — BASIC METABOLIC PANEL
Anion gap: 10 (ref 5–15)
BUN: <5 mg/dL — ABNORMAL LOW (ref 6–20)
CO2: 27 mmol/L (ref 22–32)
Calcium: 9.5 mg/dL (ref 8.9–10.3)
Chloride: 101 mmol/L (ref 101–111)
Creatinine, Ser: 0.67 mg/dL (ref 0.44–1.00)
GFR calc Af Amer: >60 mL/min (ref >60)
GFR calc non Af Amer: >60 mL/min (ref >60)
Glucose, Bld: 96 mg/dL (ref 65–99)
Potassium: 4.5 mmol/L (ref 3.5–5.1)
Sodium: 138 mmol/L (ref 135–145)

## 2015-09-01 LAB — CBC
HCT: 39.1 % (ref 36.0–46.0)
Hemoglobin: 11.6 g/dL — ABNORMAL LOW (ref 12.0–15.0)
MCH: 25.8 pg — ABNORMAL LOW (ref 26.0–34.0)
MCHC: 29.7 g/dL — ABNORMAL LOW (ref 30.0–36.0)
MCV: 87.1 fL (ref 78.0–100.0)
Platelets: 274 10*3/uL (ref 150–400)
RBC: 4.49 MIL/uL (ref 3.87–5.11)
RDW: 14.2 % (ref 11.5–15.5)
WBC: 5.6 10*3/uL (ref 4.0–10.5)

## 2015-09-01 LAB — URINE MICROSCOPIC-ADD ON

## 2015-09-01 LAB — URINALYSIS, ROUTINE W REFLEX MICROSCOPIC
Bilirubin Urine: NEGATIVE
Glucose, UA: NEGATIVE mg/dL
Hgb urine dipstick: NEGATIVE
Ketones, ur: NEGATIVE mg/dL
Leukocytes, UA: NEGATIVE
Nitrite: NEGATIVE
Protein, ur: 100 mg/dL — AB
Specific Gravity, Urine: 1.026 (ref 1.005–1.030)
pH: 6 (ref 5.0–8.0)

## 2015-09-01 LAB — BRAIN NATRIURETIC PEPTIDE: B Natriuretic Peptide: 21.4 pg/mL (ref 0.0–100.0)

## 2015-09-01 LAB — POC URINE PREG, ED: Preg Test, Ur: NEGATIVE

## 2015-09-01 LAB — LIPASE, BLOOD: Lipase: 21 U/L (ref 11–51)

## 2015-09-01 LAB — D-DIMER, QUANTITATIVE: D-Dimer, Quant: 0.27 ug/mL-FEU (ref 0.00–0.50)

## 2015-09-01 LAB — TROPONIN I
Troponin I: <0.03 ng/mL (ref <0.031)
Troponin I: <0.03 ng/mL (ref <0.031)

## 2015-09-01 LAB — PROCALCITONIN: Procalcitonin: <0.1 ng/mL

## 2015-09-01 LAB — LACTIC ACID, PLASMA: Lactic Acid, Venous: 0.8 mmol/L (ref 0.5–2.0)

## 2015-09-01 MED ORDER — ASPIRIN EC 325 MG PO TBEC
325.0000 mg | DELAYED_RELEASE_TABLET | Freq: Every day | ORAL | Status: DC
  Administered 2015-09-02 – 2015-09-05 (×4): 325 mg via ORAL
  Filled 2015-09-01 (×4): qty 1

## 2015-09-01 MED ORDER — ENOXAPARIN SODIUM 80 MG/0.8ML ~~LOC~~ SOLN
0.5000 mg/kg | SUBCUTANEOUS | Status: DC
  Administered 2015-09-02 – 2015-09-05 (×4): 80 mg via SUBCUTANEOUS
  Filled 2015-09-01 (×5): qty 0.8

## 2015-09-01 MED ORDER — ACETAMINOPHEN 650 MG RE SUPP: 650.0000 mg | Freq: Four times a day (QID) | RECTAL | Status: DC | PRN

## 2015-09-01 MED ORDER — ACETAMINOPHEN 325 MG PO TABS
650.0000 mg | ORAL_TABLET | Freq: Four times a day (QID) | ORAL | Status: DC | PRN
  Administered 2015-09-02 – 2015-09-05 (×3): 650 mg via ORAL
  Filled 2015-09-01 (×2): qty 2

## 2015-09-01 MED ORDER — ALBUTEROL SULFATE (2.5 MG/3ML) 0.083% IN NEBU
2.5000 mg | INHALATION_SOLUTION | RESPIRATORY_TRACT | Status: DC
  Administered 2015-09-02: 2.5 mg via RESPIRATORY_TRACT
  Filled 2015-09-01: qty 3

## 2015-09-01 MED ORDER — HYDROCODONE-ACETAMINOPHEN 5-325 MG PO TABS
1.0000 | ORAL_TABLET | Freq: Four times a day (QID) | ORAL | Status: DC | PRN
  Administered 2015-09-02: 2 via ORAL
  Administered 2015-09-02: 1 via ORAL
  Administered 2015-09-03 (×2): 2 via ORAL
  Administered 2015-09-04: 1 via ORAL
  Administered 2015-09-04: 2 via ORAL
  Filled 2015-09-01: qty 1
  Filled 2015-09-01 (×4): qty 2
  Filled 2015-09-01: qty 1

## 2015-09-01 MED ORDER — ONDANSETRON HCL 4 MG/2ML IJ SOLN
4.0000 mg | Freq: Four times a day (QID) | INTRAMUSCULAR | Status: DC | PRN
Start: 2015-09-01 — End: 2015-09-05

## 2015-09-01 MED ORDER — DEXTROSE 5 % IV SOLN
2.0000 g | INTRAVENOUS | Status: DC
  Administered 2015-09-01: 2 g via INTRAVENOUS
  Filled 2015-09-01 (×2): qty 2

## 2015-09-01 MED ORDER — ALBUTEROL SULFATE (2.5 MG/3ML) 0.083% IN NEBU: 2.5000 mg | INHALATION_SOLUTION | RESPIRATORY_TRACT | Status: DC | PRN

## 2015-09-01 MED ORDER — DEXTROSE 5 % IV SOLN
500.0000 mg | INTRAVENOUS | Status: DC
  Administered 2015-09-01: 500 mg via INTRAVENOUS
  Filled 2015-09-01 (×2): qty 500

## 2015-09-01 MED ORDER — FENTANYL CITRATE (PF) 100 MCG/2ML IJ SOLN
50.0000 ug | INTRAMUSCULAR | Status: DC | PRN
  Administered 2015-09-01 (×2): 50 ug via INTRAVENOUS
  Filled 2015-09-01 (×2): qty 2

## 2015-09-01 MED ORDER — ACETAMINOPHEN 325 MG PO TABS
650.0000 mg | ORAL_TABLET | Freq: Once | ORAL | Status: AC
  Administered 2015-09-01: 650 mg via ORAL
  Filled 2015-09-01: qty 2

## 2015-09-01 MED ORDER — ONDANSETRON HCL 4 MG PO TABS
4.0000 mg | ORAL_TABLET | Freq: Four times a day (QID) | ORAL | Status: DC | PRN
  Administered 2015-09-04: 4 mg via ORAL
  Filled 2015-09-01: qty 1

## 2015-09-01 MED ORDER — OSELTAMIVIR PHOSPHATE 75 MG PO CAPS
75.0000 mg | ORAL_CAPSULE | Freq: Two times a day (BID) | ORAL | Status: DC
  Administered 2015-09-01 – 2015-09-05 (×8): 75 mg via ORAL
  Filled 2015-09-01 (×14): qty 1

## 2015-09-01 MED ORDER — ALBUTEROL SULFATE (2.5 MG/3ML) 0.083% IN NEBU
5.0000 mg | INHALATION_SOLUTION | Freq: Once | RESPIRATORY_TRACT | Status: AC
  Administered 2015-09-01: 5 mg via RESPIRATORY_TRACT
  Filled 2015-09-01: qty 6

## 2015-09-01 MED ORDER — SODIUM CHLORIDE 0.9 % IV BOLUS (SEPSIS)
1000.0000 mL | Freq: Once | INTRAVENOUS | Status: AC
Start: 2015-09-01 — End: 2015-09-01
  Administered 2015-09-01: 1000 mL via INTRAVENOUS

## 2015-09-01 MED ORDER — POLYETHYLENE GLYCOL 3350 17 G PO PACK: 17.0000 g | PACK | Freq: Every day | ORAL | Status: DC | PRN

## 2015-09-01 NOTE — Progress Notes (Signed)
Ceftriaxone for CAP per pharmacy ordered.  Ceftriaxone does not need renal adjustment.  [employer] policy allows pharmacy to change the ordered dose based on indication without contacting the physician, therefore a consult is not needed.   Plan: -ceftriaxone 2g IV q24h d/t weight -pharmacy to sign off as no adjustment needed.  Garnie Borchardt D. Hideko Esselman, PharmD, BCPS Clinical Pharmacist Pager: [phone] 09/01/2015 10:09 PM

## 2015-09-01 NOTE — ED Notes (Signed)
Received pt via EMS with c/o center chest pain onset 0300 today. Pain increases with palpitation and movement. Pt also reports that she has not been taking her depression medications for 2 months due to needing a refill.

## 2015-09-01 NOTE — ED Notes (Signed)
MD at bedside. 

## 2015-09-01 NOTE — ED Notes (Signed)
Pt 91% on RA sitting still. Placed on two liters nasal cannula. Tachypnea, used bedside commode with increased WOB

## 2015-09-01 NOTE — ED Notes (Signed)
Ambulating O2 -95%

## 2015-09-01 NOTE — H&P (Signed)
[facility] History and Physical  Denis Ruggerio BMW:[id number] DOB: 16-Jan-1991 DOA: 09/01/2015  Referring physician: ER physician. PCP: Jacklynn BarnaclePLACEY,MARY H, NP  Specialists: None.  Chief Complaint: Chest pain and shortness of breath.  HPI: Yvette Gibbs is a 25 y.o. female with history of morbid obesity, obstructive sleep apnea, asthma presents to the ER because of chest pain and shortness of breath. Patient's symptoms started this morning. Patient is having pleuritic type of chest pain with productive cough. D-dimer and BNP were negative. Patient is febrile up to 103F. Denies any recent exposure to any sick contacts or any recent travel. Blood cultures were obtained and patient is increased her on antibiotics for possible pneumonia. EKG just shows sinus tachycardia. Denies any nausea vomiting diarrhea or dysuria.   Review of Systems: As presented in the history of presenting illness, rest negative.  Past Medical History  Diagnosis Date  . Mental disorder   . Depression   . Asthma   . OSA (obstructive sleep apnea)   . Extreme obesity (HCC)   . Diabetes mellitus type 2, uncontrolled (HCC)    Past Surgical History  Procedure Laterality Date  . Appendectomy    . Tonsillectomy    . Cholecystectomy N/A 09/05/2014    Procedure: LAPAROSCOPIC CHOLECYSTECTOMY;  Surgeon: Axel FillerArmando Ramirez, MD;  [facility];  Service: General;  Laterality: N/A;   Social History:  reports that she has never smoked. She has never used smokeless tobacco. She reports that she does not drink alcohol or use illicit drugs. Where does patient live Home. Can patient participate in ADLs? Yes.  No Known Allergies  Family History:  Family History  Problem Relation Age of Onset  . Diabetes Mellitus II Maternal Grandmother       Prior to Admission medications   Medication Sig Start Date End Date Taking? Authorizing Provider  albuterol (PROVENTIL HFA;VENTOLIN HFA) 108 (90 BASE) MCG/ACT inhaler Inhale 2  puffs into the lungs every 6 (six) hours as needed for wheezing or shortness of breath.   Yes Historical Provider, MD  docusate sodium (COLACE) 100 MG capsule Take 1 capsule (100 mg total) by mouth 2 (two) times daily as needed for mild constipation. 09/06/14   Nonie HoyerMegan N Baird, PA-C  HYDROcodone-acetaminophen (NORCO/VICODIN) 5-325 MG per tablet Take 1-2 tablets by mouth every 6 (six) hours as needed for moderate pain or severe pain. 09/06/14   Nonie HoyerMegan N Baird, PA-C  polyethylene glycol (MIRALAX / GLYCOLAX) packet Take 17 g by mouth daily as needed for mild constipation or moderate constipation. 09/06/14   Nonie HoyerMegan N Baird, PA-C    Physical Exam: Filed Vitals:   09/01/15 1945 09/01/15 1955 09/01/15 2015 09/01/15 2116  BP: 170/87 170/87 162/110 147/88  Pulse: 116 117 103 110  Temp:  100 F (37.8 C)  103.1 F (39.5 C)  TempSrc:  Rectal  Oral  Resp: 37 18 38 32  Height:    5\' 2"  (1.575 m)  Weight:      SpO2: 99% 100% 96% 97%     General:  Obese not in distress.  Eyes: Anicteric. No pallor.  ENT: No discharge from the ears eyes nose or mouth.  Neck: No mass felt.  Cardiovascular: S1 and S2 heard.  Respiratory: Mild wheezing. No crepitations.  Abdomen: Soft nontender bowel sounds present.  Skin: No rash.  Musculoskeletal: No edema.  Psychiatric: Appears normal.  Neurologic: Alert awake oriented to time place and person. Moves all extremities.  Labs on Admission:  Basic Metabolic Panel:  Recent Labs  Lab 09/01/15 1725  NA 138  K 4.5  CL 101  CO2 27  GLUCOSE 96  BUN <5*  CREATININE 0.67  CALCIUM 9.5   Liver Function Tests: No results for input(s): AST, ALT, ALKPHOS, BILITOT, PROT, ALBUMIN in the last 168 hours. No results for input(s): LIPASE, AMYLASE in the last 168 hours. No results for input(s): AMMONIA in the last 168 hours. CBC:  Recent Labs Lab 09/01/15 1725  WBC 5.6  HGB 11.6*  HCT 39.1  MCV 87.1  PLT 274   Cardiac Enzymes:  Recent Labs Lab  09/01/15 1725  TROPONINI <0.03    BNP (last 3 results)  Recent Labs  09/01/15 2015  BNP 21.4    ProBNP (last 3 results) No results for input(s): PROBNP in the last 8760 hours.  CBG: No results for input(s): GLUCAP in the last 168 hours.  Radiological Exams on Admission: Dg Chest 2 View  09/01/2015  CLINICAL DATA:  Stabbing Centralized chest pain since 3am, hx of asthma EXAM: CHEST  2 VIEW COMPARISON:  None. FINDINGS: Heart size is upper limits normal. There is pulmonary vascular congestion but no overt edema. No focal consolidations or pleural effusions are identified. Study quality is degraded by patient body habitus. IMPRESSION: Cardiomegaly and pulmonary vascular congestion. Electronically Signed   By: Norva Pavlov M.D.   On: 09/01/2015 17:35    EKG: Independently reviewed. Sinus tachycardia.  Assessment/Plan Principal Problem:   CAP (community acquired pneumonia) Active Problems:   Pleuritic chest pain   Morbid obesity (HCC)   Asthma exacerbation   OSA (obstructive sleep apnea)   Pneumonia   1. Community-acquired pneumonia - given the patient's shortness of breath productive cough and pleuritic-type of chest pain though chest x-ray does not show any infiltrates I have placed patient on antibiotics for current acquired pneumonia given the clinical picture. I have also placed patient on Tamiflu for influenza. Check influenza PCR HIV status urine for strep antigen and Legionella antigen. We'll check pro-calcitonin levels. 2. Pleuritic-type of chest pain - probably from pneumonia. Check troponin and 2-D echo given the congestive pattern and x-ray. 3. Asthma - I have placed patient on nebulizer. Patient has only mild wheezing. 4. Obstructive sleep apnea on CPAP at bedtime.   DVT Prophylaxis Lovenox.  Code Status: Full code.  Family Communication: Discussed with patient.  Disposition Plan: Admit to inpatient.    Seena Ritacco N. [facility] Pager  [phone].  If 7PM-7AM, please contact night-coverage [email or web address] Password [facility] 09/01/2015, 10:19 PM

## 2015-09-01 NOTE — ED Provider Notes (Signed)
CSN: [id number]     Arrival date & time 09/01/15  1537 History   First MD Initiated Contact with Patient 09/01/15 1549     Chief Complaint  Patient presents with  . Chest Pain     (Consider location/radiation/quality/duration/timing/severity/associated sxs/prior Treatment) HPI Comments: 25 year old female with morbid obesity, diabetes presents with chest pain.  Patient is had sharp anterior nonradiating chest pain bilateral since partially 3:00 today. Patient's had this occasionally. This is more severe than normal. He is also had a cough nonproductive and a fever at home. Aside from morbid obesity patient denies classic blood clot risk factors. Patient does have pleuritic component. No heart attack or blood clot history. Pain is also with movement and coughing.  Patient is a 25 y.o. female presenting with chest pain. The history is provided by the patient.  Chest Pain Associated symptoms: cough, fever and shortness of breath   Associated symptoms: no abdominal pain, no back pain, no headache and not vomiting     Past Medical History  Diagnosis Date  . Mental disorder   . Depression   . Asthma   . OSA (obstructive sleep apnea)   . Extreme obesity (HCC)   . Diabetes mellitus type 2, uncontrolled (HCC)    Past Surgical History  Procedure Laterality Date  . Appendectomy    . Tonsillectomy    . Cholecystectomy N/A 09/05/2014    Procedure: LAPAROSCOPIC CHOLECYSTECTOMY;  Surgeon: Axel FillerArmando Ramirez, MD;  [facility];  Service: General;  Laterality: N/A;   No family history on file. Social History  Substance Use Topics  . Smoking status: Never Smoker   . Smokeless tobacco: Never Used  . Alcohol Use: No   OB History    No data available     Review of Systems  Constitutional: Positive for fever and appetite change. Negative for chills.  HENT: Negative for congestion.   Eyes: Negative for visual disturbance.  Respiratory: Positive for cough and shortness of breath.    Cardiovascular: Positive for chest pain.  Gastrointestinal: Negative for vomiting and abdominal pain.  Genitourinary: Negative for dysuria and flank pain.  Musculoskeletal: Positive for arthralgias. Negative for back pain, neck pain and neck stiffness.  Skin: Negative for rash.  Neurological: Negative for light-headedness and headaches.      Allergies  Review of patient's allergies indicates no known allergies.  Home Medications   Prior to Admission medications   Medication Sig Start Date End Date Taking? Authorizing Provider  albuterol (PROVENTIL HFA;VENTOLIN HFA) 108 (90 BASE) MCG/ACT inhaler Inhale 2 puffs into the lungs every 6 (six) hours as needed for wheezing or shortness of breath.    Historical Provider, MD  docusate sodium (COLACE) 100 MG capsule Take 1 capsule (100 mg total) by mouth 2 (two) times daily as needed for mild constipation. 09/06/14   Nonie HoyerMegan N Baird, PA-C  HYDROcodone-acetaminophen (NORCO/VICODIN) 5-325 MG per tablet Take 1-2 tablets by mouth every 6 (six) hours as needed for moderate pain or severe pain. 09/06/14   Nonie HoyerMegan N Baird, PA-C  polyethylene glycol (MIRALAX / GLYCOLAX) packet Take 17 g by mouth daily as needed for mild constipation or moderate constipation. 09/06/14   Megan N Baird, PA-C   BP 154/101 mmHg  Pulse 114  Temp(Src) 99.1 F (37.3 C) (Oral)  Resp 25  Ht 5\' 2"  (1.575 m)  Wt 351 lb 6 oz (159.383 kg)  BMI 64.25 kg/m2  SpO2 93%  LMP 07/01/2015 Physical Exam  Constitutional: She is oriented to person, place, and  time. She appears well-developed and well-nourished.  HENT:  Head: Normocephalic and atraumatic.  Eyes: Conjunctivae are normal. Right eye exhibits no discharge. Left eye exhibits no discharge.  Neck: Normal range of motion. Neck supple. No tracheal deviation present.  Cardiovascular: Regular rhythm.  Tachycardia present.   Pulmonary/Chest:  Difficulty appreciating details of lung exam due to body habitus, mild tachypnea and mild  increase effort no significant retraction  Abdominal: Soft. She exhibits no distension. There is no tenderness. There is no guarding.  Musculoskeletal: She exhibits no edema.  Neurological: She is alert and oriented to person, place, and time.  Skin: Skin is warm. No rash noted.  Psychiatric: She has a normal mood and affect.  Nursing note and vitals reviewed.   ED Course  Procedures (including critical care time)   EMERGENCY DEPARTMENT [place] CARDIAC EXAM "Study: Limited Ultrasound of the heart and pericardium"  INDICATIONS:Dyspnea Multiple views of the heart and pericardium were obtained in real-time with a multi-frequency probe.  PERFORMED ZO:XWRUEA  IMAGES ARCHIVED?: Yes  FINDINGS: No pericardial effusion and Tamponade physiology absent  LIMITATIONS:  Body habitus  VIEWS USED: Parasternal long axis and Parasternal short axis  INTERPRETATION: Cardiac activity present, Pericardial effusioin absent and Cardiac tamponade absentlimited due to body habitus  CPT Code: 54098-11 (limited transthoracic cardiac)  Labs Review Labs Reviewed  BASIC METABOLIC PANEL  CBC  D-DIMER, QUANTITATIVE (NOT AT [facility])  TROPONIN I  POC URINE PREG, ED    Imaging Review No results found. I have personally reviewed and evaluated these images and lab results as part of my medical decision-making.   EKG Interpretation   Date/Time:  Sunday September 01 2015 15:41:45 EDT Ventricular Rate:  117 PR Interval:  164 QRS Duration: 114 QT Interval:  332 QTC Calculation: 463 R Axis:   -35 Text Interpretation:  Sinus tachycardia Right atrial enlargement Left axis  deviation Incomplete right bundle branch block Abnormal ECG Confirmed by  Hjalmer Iovino  MD, Keshana Klemz ([id number]) on 09/01/2015 4:02:16 PM      MDM   Final diagnoses:  Chest pain, unspecified chest pain type  Morbid obesity, unspecified obesity type (HCC)  Cough   Patient presents with mixed clinical picture. One aspect consistent with flulike  symptoms/pneumonia with cough bodyaches and pain with movement and breathing. However patient also has tachycardia, right bundle branch block and pleuritic chest pain. Plan for d-dimer as patient low risk, screening blood work chest x-ray pain meds and reassessment.  Patient's d-dimer negative. Patient remained tachycardiac, patient requiring 1-2 L nasal cannula. Afebrile. One IV fluid bolus given. Chest x-ray mild congestion held further IV fluids at this time per lactate added influence added. [facility] with [facility] for observation for further workup and possible formal echo. No significant pericardial effusion bedside ultrasound. Results and differential diagnosis were discussed with the patient/parent/guardian. Xrays were independently reviewed by myself.  Close follow up outpatient was discussed, comfortable with the plan.   Medications  fentaNYL (SUBLIMAZE) injection 50 mcg (50 mcg Intravenous Given 09/01/15 2010)  sodium chloride 0.9 % bolus 1,000 mL (0 mLs Intravenous Stopped 09/01/15 1905)  albuterol (PROVENTIL) (2.5 MG/3ML) 0.083% nebulizer solution 5 mg (5 mg Nebulization Given 09/01/15 1726)    Filed Vitals:   09/01/15 1845 09/01/15 1915 09/01/15 1945 09/01/15 1955  BP: 154/88 172/92 170/87 170/87  Pulse: 119 121 116 117  Temp:    100 F (37.8 C)  TempSrc:    Rectal  Resp: 36 44 37 18  Height:  Weight:      SpO2: 100% 89% 99% 100%    Final diagnoses:  Chest pain, unspecified chest pain type  Morbid obesity, unspecified obesity type (HCC)  Cough       Blane Ohara, MD 09/01/15 2012

## 2015-09-02 ENCOUNTER — Encounter (HOSPITAL_COMMUNITY): Payer: Self-pay | Admitting: General Practice

## 2015-09-02 ENCOUNTER — Inpatient Hospital Stay (HOSPITAL_COMMUNITY): Payer: Self-pay

## 2015-09-02 DIAGNOSIS — G4733 Obstructive sleep apnea (adult) (pediatric): Secondary | ICD-10-CM

## 2015-09-02 DIAGNOSIS — J101 Influenza due to other identified influenza virus with other respiratory manifestations: Principal | ICD-10-CM

## 2015-09-02 DIAGNOSIS — R079 Chest pain, unspecified: Secondary | ICD-10-CM

## 2015-09-02 LAB — TROPONIN I
Troponin I: <0.03 ng/mL (ref <0.031)
Troponin I: <0.03 ng/mL (ref <0.031)

## 2015-09-02 LAB — BASIC METABOLIC PANEL
Anion gap: 12 (ref 5–15)
BUN: <5 mg/dL — ABNORMAL LOW (ref 6–20)
CO2: 25 mmol/L (ref 22–32)
Calcium: 8.8 mg/dL — ABNORMAL LOW (ref 8.9–10.3)
Chloride: 99 mmol/L — ABNORMAL LOW (ref 101–111)
Creatinine, Ser: 0.64 mg/dL (ref 0.44–1.00)
GFR calc Af Amer: >60 mL/min (ref >60)
GFR calc non Af Amer: >60 mL/min (ref >60)
Glucose, Bld: 122 mg/dL — ABNORMAL HIGH (ref 65–99)
Potassium: 4 mmol/L (ref 3.5–5.1)
Sodium: 136 mmol/L (ref 135–145)

## 2015-09-02 LAB — ECHOCARDIOGRAM COMPLETE
Height: 62 in
Weight: 5641.6 oz

## 2015-09-02 LAB — BLOOD GAS, ARTERIAL
Acid-Base Excess: 5.3 mmol/L — ABNORMAL HIGH (ref 0.0–2.0)
Bicarbonate: 30.2 mEq/L — ABNORMAL HIGH (ref 20.0–24.0)
Drawn by: 295031
O2 Content: 2 L/min
O2 Saturation: 92.6 %
Patient temperature: 98.6
TCO2: 31.8 mmol/L (ref 0–100)
pCO2 arterial: 52.4 mmHg — ABNORMAL HIGH (ref 35.0–45.0)
pH, Arterial: 7.379 (ref 7.350–7.450)
pO2, Arterial: 68.4 mmHg — ABNORMAL LOW (ref 80.0–100.0)

## 2015-09-02 LAB — STREP PNEUMONIAE URINARY ANTIGEN: Strep Pneumo Urinary Antigen: POSITIVE — AB

## 2015-09-02 LAB — CBC
HCT: 36.2 % (ref 36.0–46.0)
Hemoglobin: 11.1 g/dL — ABNORMAL LOW (ref 12.0–15.0)
MCH: 26.7 pg (ref 26.0–34.0)
MCHC: 30.7 g/dL (ref 30.0–36.0)
MCV: 87.2 fL (ref 78.0–100.0)
Platelets: 225 10*3/uL (ref 150–400)
RBC: 4.15 MIL/uL (ref 3.87–5.11)
RDW: 14.5 % (ref 11.5–15.5)
WBC: 5 10*3/uL (ref 4.0–10.5)

## 2015-09-02 LAB — HIV ANTIBODY (ROUTINE TESTING W REFLEX): HIV Screen 4th Generation wRfx: NONREACTIVE

## 2015-09-02 LAB — INFLUENZA PANEL BY PCR (TYPE A & B)
H1N1 flu by pcr: NOT DETECTED
Influenza A By PCR: POSITIVE — AB
Influenza B By PCR: NEGATIVE

## 2015-09-02 LAB — LACTIC ACID, PLASMA: Lactic Acid, Venous: 1.9 mmol/L (ref 0.5–2.0)

## 2015-09-02 MED ORDER — INFLUENZA VAC SPLIT QUAD 0.5 ML IM SUSY
0.5000 mL | PREFILLED_SYRINGE | INTRAMUSCULAR | Status: AC
  Administered 2015-09-03: 0.5 mL via INTRAMUSCULAR
  Filled 2015-09-02: qty 0.5

## 2015-09-02 MED ORDER — ALBUTEROL SULFATE (2.5 MG/3ML) 0.083% IN NEBU
2.5000 mg | INHALATION_SOLUTION | Freq: Four times a day (QID) | RESPIRATORY_TRACT | Status: DC
  Administered 2015-09-02 – 2015-09-05 (×14): 2.5 mg via RESPIRATORY_TRACT
  Filled 2015-09-02 (×14): qty 3

## 2015-09-02 NOTE — Progress Notes (Signed)
Pt NIV is set and ready for use O2 humidity provided in the reservoir of the device. RN and NT is at bedside. RN tending to patient IV at this time and NT is helping pt to bedside commode. Once pt is ready for bed RN stated he will place pt on NIV. Pressure is set at 10. Pt is stable at this time No complications or distress noted.

## 2015-09-02 NOTE — Progress Notes (Signed)
PROGRESS NOTE  Yvette Gibbs ZOX:[id number] DOB: [birth year] DOA: 09/01/2015 PCP: Jacklynn Barnacle, NP  HPI/Recap of past 24 hours:  Patient is a 25 year old female with past medical history of obstructive sleep apnea and morbid obesity plus asthma admitted on 3/19 with several days of chest tightness and shortness of breath with fever of 103. On admission, chest x-ray unrevealing as were BNP and d-dimer. Started on antibiotics for presumed pneumonia, patient found to have positive titer for influenza a.    Today, patient states breathing about the same. Chest tightness a little bit easier. Still some wheezing.  Assessment/Plan: Principal Problem:   Influenza A leading to mild asthma exacerbation: Continue nebulizers plus Tamiflu. Given normal TONE and lactic acid level, discontinuing antibiotics. If wheezing persisting by tomorrow, we'll add steroids. Check ambulatory pulse ox.  Active Problems:   CAP (community acquired pneumonia)   Pleuritic chest pain: Likely bronchospasm from asthma exacerbation. No evidence of cardiac issue   Morbid obesity [facility]): Patient meets criteria with BMI greater than 40    OSA (obstructive sleep apnea): Nightly CPAP   diabetes mellitus: Patient had elevated A1c 1 year ago. Rechecking. Currently not on any home medications.  Code Status: Full code    Family Communication: Left message for grandmother    Disposition Plan: Likely home in next 1-2 days    Consultants:  None    Procedures:  None    Antibiotics:  Rocephin and Zithromax 3/19-20  Tamiflu 3/19-present     Objective: BP 149/81 mmHg  Pulse 113  Temp(Src) 100.8 F (38.2 C) (Oral)  Resp 30  Ht  (1.575 m)  Wt 159.938 kg (352 lb 9.6 oz)  BMI 64.47 kg/m2  SpO2 95%  LMP 07/01/2015  Intake/Output Summary (Last 24 hours) at 09/02/15 1348 Last data filed at 09/02/15 0957  Gross per 24 hour  Intake    900 ml  Output    450 ml  Net    450 ml   Filed Weights   09/01/15 1545  09/02/15 0043  Weight: 159.383 kg (351 lb 6 oz) 159.938 kg (352 lb 9.6 oz)    Exam:   General:  Alert and oriented 3, no acute distress   cardiovascular: Regular rate and rhythm, S1-S2, soft   Respiratory: Decreased breath sounds throughout secondary to body habitus, mild end expiratory wheeze bilaterally   Abdomen: Soft, obese, nontender, hypoactive bowel sounds   Musculoskeletal: Trace pitting edema    Data Reviewed: Basic Metabolic Panel:  Recent Labs Lab 09/01/15 1725 09/02/15 0557  NA 138 136  K 4.5 4.0  CL 101 99*  CO2 27 25  GLUCOSE 96 122*  BUN <5* <5*  CREATININE 0.67 0.64  CALCIUM 9.5 8.8*   Liver Function Tests:  Recent Labs Lab 09/01/15 2155  AST 26  ALT 27  ALKPHOS 66  BILITOT 0.5  PROT 7.2  ALBUMIN 3.0*    Recent Labs Lab 09/01/15 2155  LIPASE 21   No results for input(s): AMMONIA in the last 168 hours. CBC:  Recent Labs Lab 09/01/15 1725 09/02/15 0557  WBC 5.6 5.0  HGB 11.6* 11.1*  HCT 39.1 36.2  MCV 87.1 87.2  PLT 274 225   Cardiac Enzymes:    Recent Labs Lab 09/01/15 1725 09/01/15 2155 09/02/15 0557 09/02/15 1045  TROPONINI <0.03 <0.03 <0.03 <0.03   BNP (last 3 results)  Recent Labs  09/01/15 2015  BNP 21.4    ProBNP (last 3 results) No results for input(s): PROBNP in the  last 8760 hours.  CBG: No results for input(s): GLUCAP in the last 168 hours.  No results found for this or any previous visit (from the past 240 hour(s)).   Studies: Dg Chest 2 View  09/01/2015  CLINICAL DATA:  Stabbing Centralized chest pain since 3am, hx of asthma EXAM: CHEST  2 VIEW COMPARISON:  None. FINDINGS: Heart size is upper limits normal. There is pulmonary vascular congestion but no overt edema. No focal consolidations or pleural effusions are identified. Study quality is degraded by patient body habitus. IMPRESSION: Cardiomegaly and pulmonary vascular congestion. Electronically Signed   By: Norva PavlovElizabeth  Brown M.D.   On:  09/01/2015 17:35    Scheduled Meds: . albuterol  2.5 mg Nebulization QID  . aspirin EC  325 mg Oral Daily  . enoxaparin (LOVENOX) injection  0.5 mg/kg Subcutaneous Q24H  . [START ON 09/03/2015] Influenza vac split quadrivalent PF  0.5 mL Intramuscular Tomorrow-1000  . oseltamivir  75 mg Oral BID    Continuous Infusions:    Time spent: 25 minutes   Hollice EspyKRISHNAN,Keelia Graybill K  [facility] Pager [phone] . If 7PM-7AM, please contact night-coverage at [email or web address], password [email or web address] 09/02/2015, 1:48 PM  LOS: 1 day

## 2015-09-02 NOTE — Progress Notes (Signed)
Came to assess pt. Pt is on NIV at this time, tolerating it well.

## 2015-09-02 NOTE — Progress Notes (Signed)
Febrile 102.3 Pt lethargic [place] but easily to get back to sleep. Made Dr Genella MechKrshnan aware with order

## 2015-09-02 NOTE — Progress Notes (Signed)
Utilization review completed. Nicholas Trompeter, RN, BSN. 

## 2015-09-02 NOTE — Progress Notes (Signed)
  Echocardiogram 2D Echocardiogram has been performed.  Yvette Gibbs, Yvette Gibbs 09/02/2015, 1:49 PM

## 2015-09-02 NOTE — Progress Notes (Signed)
Pt may need a picc line due to having a hard time sticking for labs, does have a foot stick order now though, Thanks Lavonda JumboMike F RN

## 2015-09-03 LAB — BASIC METABOLIC PANEL
Anion gap: 9 (ref 5–15)
BUN: <5 mg/dL — ABNORMAL LOW (ref 6–20)
CO2: 30 mmol/L (ref 22–32)
Calcium: 8.9 mg/dL (ref 8.9–10.3)
Chloride: 97 mmol/L — ABNORMAL LOW (ref 101–111)
Creatinine, Ser: 0.72 mg/dL (ref 0.44–1.00)
GFR calc Af Amer: >60 mL/min (ref >60)
GFR calc non Af Amer: >60 mL/min (ref >60)
Glucose, Bld: 150 mg/dL — ABNORMAL HIGH (ref 65–99)
Potassium: 3.6 mmol/L (ref 3.5–5.1)
Sodium: 136 mmol/L (ref 135–145)

## 2015-09-03 LAB — CBC
HCT: 37 % (ref 36.0–46.0)
Hemoglobin: 10.7 g/dL — ABNORMAL LOW (ref 12.0–15.0)
MCH: 25.8 pg — ABNORMAL LOW (ref 26.0–34.0)
MCHC: 28.9 g/dL — ABNORMAL LOW (ref 30.0–36.0)
MCV: 89.4 fL (ref 78.0–100.0)
Platelets: 226 10*3/uL (ref 150–400)
RBC: 4.14 MIL/uL (ref 3.87–5.11)
RDW: 14.5 % (ref 11.5–15.5)
WBC: 4.9 10*3/uL (ref 4.0–10.5)

## 2015-09-03 LAB — LEGIONELLA PNEUMOPHILA SEROGP 1 UR AG: L. pneumophila Serogp 1 Ur Ag: NEGATIVE

## 2015-09-03 MED ORDER — PREDNISONE 50 MG PO TABS
50.0000 mg | ORAL_TABLET | Freq: Two times a day (BID) | ORAL | Status: DC
  Administered 2015-09-03 – 2015-09-04 (×2): 50 mg via ORAL
  Filled 2015-09-03 (×2): qty 1

## 2015-09-03 NOTE — Progress Notes (Signed)
SATURATION QUALIFICATIONS: (This note is used to comply with regulatory documentation for home oxygen)  Patient Saturations on Room Air at Rest = 78%  Patient Saturations on Room Air while Ambulating = 86%  Patient Saturations on 2.5 Liters of oxygen while Ambulating = 92%  Please briefly explain why patient needs home oxygen:

## 2015-09-03 NOTE — Progress Notes (Signed)
Pt given incentive spirometer, pt using it at bedside

## 2015-09-03 NOTE — Progress Notes (Signed)
Placed patient on CPAP for the night.  Patient is tolerating well at this time. 

## 2015-09-03 NOTE — Progress Notes (Signed)
PROGRESS NOTE  Yvette Gibbs ZOX:[id number] DOB: December 24, 1990 DOA: 09/01/2015 PCP: Jacklynn BarnaclePLACEY,MARY H, NP  HPI/Recap of past 24 hours:  Patient is a 25 year old female with past medical history of obstructive sleep apnea and morbid obesity plus asthma admitted on 3/19 with several days of chest tightness and shortness of breath with fever of 103. On admission, chest x-ray unrevealing as were BNP and d-dimer. Started on antibiotics for presumed pneumonia, patient found to have positive titer for influenza a.    Patient's breathing in her opinion is a little bit better. Less cough.  Assessment/Plan: Principal Problem:   Influenza A leading to mild asthma exacerbation: Continue nebulizers plus Tamiflu. Given normal TONE and lactic acid level, discontinuing antibiotics. With ambulation on room air, oxygen saturations at 86%.  We'll go ahead and add low-dose steroids Active Problems:    Pleuritic chest pain: Likely bronchospasm from asthma exacerbation. No evidence of cardiac issue   Morbid obesity [facility](HCC): Patient meets criteria with BMI greater than 40    OSA (obstructive sleep apnea): Nightly CPAP   diabetes mellitus: Patient had elevated A1c 1 year ago. Rechecking. Currently not on any home medications.  Code Status: Full code    Family Communication: Spoke with aunt by phone  Disposition Plan: Likely home in next 1-2 days    Consultants:  None    Procedures:  None    Antibiotics:  Rocephin and Zithromax 3/19-20  Tamiflu 3/19-present     Objective: BP 140/71 mmHg  Pulse 116  Temp(Src) 100 F (37.8 C) (Oral)  Resp 21  Ht 5\' 2"  (1.575 m)  Wt 160.21 kg (353 lb 3.2 oz)  BMI 64.58 kg/m2  SpO2 86%  LMP 07/01/2015  Intake/Output Summary (Last 24 hours) at 09/03/15 1141 Last data filed at 09/03/15 0900  Gross per 24 hour  Intake   1500 ml  Output   1475 ml  Net     25 ml   Filed Weights   09/01/15 1545 09/02/15 0043 09/03/15 0656  Weight: 159.383 kg (351 lb 6 oz)  159.938 kg (352 lb 9.6 oz) 160.21 kg (353 lb 3.2 oz)    Exam:   General:  Alert and oriented 3, no acute distress   cardiovascular: Regular rate and rhythm, S1-S2, soft   Respiratory: Decreased breath sounds throughout secondary to body habitus, no wheezing  Abdomen: Soft, obese, nontender, hypoactive bowel sounds   Musculoskeletal: Trace pitting edema    Data Reviewed: Basic Metabolic Panel:  Recent Labs Lab 09/01/15 1725 09/02/15 0557 09/03/15 0303  NA 138 136 136  K 4.5 4.0 3.6  CL 101 99* 97*  CO2 27 25 30   GLUCOSE 96 122* 150*  BUN <5* <5* <5*  CREATININE 0.67 0.64 0.72  CALCIUM 9.5 8.8* 8.9   Liver Function Tests:  Recent Labs Lab 09/01/15 2155  AST 26  ALT 27  ALKPHOS 66  BILITOT 0.5  PROT 7.2  ALBUMIN 3.0*    Recent Labs Lab 09/01/15 2155  LIPASE 21   No results for input(s): AMMONIA in the last 168 hours. CBC:  Recent Labs Lab 09/01/15 1725 09/02/15 0557 09/03/15 0303  WBC 5.6 5.0 4.9  HGB 11.6* 11.1* 10.7*  HCT 39.1 36.2 37.0  MCV 87.1 87.2 89.4  PLT 274 225 226   Cardiac Enzymes:    Recent Labs Lab 09/01/15 1725 09/01/15 2155 09/02/15 0557 09/02/15 1045  TROPONINI <0.03 <0.03 <0.03 <0.03   BNP (last 3 results)  Recent Labs  09/01/15 2015  BNP 21.4  ProBNP (last 3 results) No results for input(s): PROBNP in the last 8760 hours.  CBG: No results for input(s): GLUCAP in the last 168 hours.  Recent Results (from the past 240 hour(s))  Blood culture (routine x 2)     Status: None (Preliminary result)   Collection Time: 09/01/15  9:45 PM  Result Value Ref Range Status   Specimen Description BLOOD RIGHT ANTECUBITAL  Final   Special Requests BOTTLES DRAWN AEROBIC ONLY 5CC  Final   Culture NO GROWTH < 24 HOURS  Final   Report Status PENDING  Incomplete  Blood culture (routine x 2)     Status: None (Preliminary result)   Collection Time: 09/01/15  9:50 PM  Result Value Ref Range Status   Specimen Description BLOOD  RIGHT ARM  Final   Special Requests   Final    BOTTLES DRAWN AEROBIC AND ANAEROBIC 6CC BLUE 5CC PURPLE   Culture NO GROWTH < 24 HOURS  Final   Report Status PENDING  Incomplete     Studies: No results found.  Scheduled Meds: . albuterol  2.5 mg Nebulization QID  . aspirin EC  325 mg Oral Daily  . enoxaparin (LOVENOX) injection  0.5 mg/kg Subcutaneous Q24H  . oseltamivir  75 mg Oral BID    Continuous Infusions:    Time spent: 25 minutes   Hollice Espy  [facility] Pager [phone] . If 7PM-7AM, please contact night-coverage at [email or web address], password [facility] 09/03/2015, 11:41 AM  LOS: 2 days

## 2015-09-04 LAB — HEMOGLOBIN A1C
Hgb A1c MFr Bld: 6.1 % — ABNORMAL HIGH (ref 4.8–5.6)
Mean Plasma Glucose: 128 mg/dL

## 2015-09-04 MED ORDER — PREDNISONE 50 MG PO TABS
50.0000 mg | ORAL_TABLET | Freq: Every day | ORAL | Status: DC
  Administered 2015-09-05: 50 mg via ORAL
  Filled 2015-09-04: qty 1

## 2015-09-04 MED ORDER — HYDROCOD POLST-CPM POLST ER 10-8 MG/5ML PO SUER
5.0000 mL | Freq: Once | ORAL | Status: AC
  Administered 2015-09-04: 5 mL via ORAL
  Filled 2015-09-04: qty 5

## 2015-09-04 NOTE — Progress Notes (Addendum)
PROGRESS NOTE  Yvette Gibbs [id number] DOB: Jan 13, 1991 DOA: 09/01/2015 PCP: Jacklynn BarnaclePLACEY,MARY H, NP  HPI/Recap of past 24 hours:  Patient is a 25 year old female with past medical history of obstructive sleep apnea and morbid obesity plus asthma admitted on 3/19 with several days of chest tightness and shortness of breath with fever of 103. On admission, chest x-ray unrevealing as were BNP and d-dimer. Started on antibiotics for presumed pneumonia, patient found to have positive titer for influenza a.    Patient's breathing in her opinion is a little bit better. Less cough.  Assessment/Plan: Principal Problem:   Influenza A/Asthma exacerbation: -improving, continue Tamiflu, nebs -off Abx now, but strep pneumo Ag positive so will add  -continue Prednisone cut down dose -wean O2 -ambulate    Pleuritic chest pain:  -due to #1, cough -improving    Morbid obesity [facility](HCC): Patient meets criteria with BMI greater than 40    OSA (obstructive sleep apnea): Nightly CPAP       DM -hbaic was 7.8 in 3/16, repeat pending -dietician consult, will start metformin at discharge -not on home meds, needs PCP  DVT proph: lovenox  Code Status: Full code   Family Communication:none at bedside Disposition Plan: home tomorrow    Consultants:  None    Procedures:  None    Antibiotics:  Rocephin and Zithromax 3/19-20  Tamiflu 3/19-present     Objective: BP 141/73 mmHg  Pulse 113  Temp(Src) 98 F (36.7 C) (Oral)  Resp 20  Ht 5\' 2"  (1.575 m)  Wt 158.396 kg (349 lb 3.2 oz)  BMI 63.85 kg/m2  SpO2 95%  LMP 07/01/2015  Intake/Output Summary (Last 24 hours) at 09/04/15 1140 Last data filed at 09/04/15 1033  Gross per 24 hour  Intake   1660 ml  Output    600 ml  Net   1060 ml   Filed Weights   09/02/15 0043 09/03/15 0656 09/04/15 0505  Weight: 159.938 kg (352 lb 9.6 oz) 160.21 kg (353 lb 3.2 oz) 158.396 kg (349 lb 3.2 oz)    Exam:   General:  Alert and oriented 3, no  acute distress   cardiovascular: Regular rate and rhythm, S1-S2, soft   Respiratory: Decreased breath sounds throughout secondary to body habitus, no wheezing  Abdomen: Soft, obese, nontender, hypoactive bowel sounds   Musculoskeletal: Trace pitting edema    Data Reviewed: Basic Metabolic Panel:  Recent Labs Lab 09/01/15 1725 09/02/15 0557 09/03/15 0303  NA 138 136 136  K 4.5 4.0 3.6  CL 101 99* 97*  CO2 27 25 30   GLUCOSE 96 122* 150*  BUN <5* <5* <5*  CREATININE 0.67 0.64 0.72  CALCIUM 9.5 8.8* 8.9   Liver Function Tests:  Recent Labs Lab 09/01/15 2155  AST 26  ALT 27  ALKPHOS 66  BILITOT 0.5  PROT 7.2  ALBUMIN 3.0*    Recent Labs Lab 09/01/15 2155  LIPASE 21   No results for input(s): AMMONIA in the last 168 hours. CBC:  Recent Labs Lab 09/01/15 1725 09/02/15 0557 09/03/15 0303  WBC 5.6 5.0 4.9  HGB 11.6* 11.1* 10.7*  HCT 39.1 36.2 37.0  MCV 87.1 87.2 89.4  PLT 274 225 226   Cardiac Enzymes:    Recent Labs Lab 09/01/15 1725 09/01/15 2155 09/02/15 0557 09/02/15 1045  TROPONINI <0.03 <0.03 <0.03 <0.03   BNP (last 3 results)  Recent Labs  09/01/15 2015  BNP 21.4    ProBNP (last 3 results) No results for input(s): PROBNP  in the last 8760 hours.  CBG: No results for input(s): GLUCAP in the last 168 hours.  Recent Results (from the past 240 hour(s))  Blood culture (routine x 2)     Status: None (Preliminary result)   Collection Time: 09/01/15  9:45 PM  Result Value Ref Range Status   Specimen Description BLOOD RIGHT ANTECUBITAL  Final   Special Requests BOTTLES DRAWN AEROBIC ONLY 5CC  Final   Culture NO GROWTH 2 DAYS  Final   Report Status PENDING  Incomplete  Blood culture (routine x 2)     Status: None (Preliminary result)   Collection Time: 09/01/15  9:50 PM  Result Value Ref Range Status   Specimen Description BLOOD RIGHT ARM  Final   Special Requests   Final    BOTTLES DRAWN AEROBIC AND ANAEROBIC 6CC BLUE 5CC PURPLE    Culture NO GROWTH 2 DAYS  Final   Report Status PENDING  Incomplete     Studies: No results found.  Scheduled Meds: . albuterol  2.5 mg Nebulization QID  . aspirin EC  325 mg Oral Daily  . enoxaparin (LOVENOX) injection  0.5 mg/kg Subcutaneous Q24H  . oseltamivir  75 mg Oral BID  . [START ON 09/05/2015] predniSONE  50 mg Oral Q breakfast    Continuous Infusions:    Time spent: 25 minutes   [facility]  [facility] Pager [phone] . If 7PM-7AM, please contact night-coverage at [email or web address], password [facility] 09/04/2015, 11:40 AM  LOS: 3 days

## 2015-09-04 NOTE — Progress Notes (Signed)
Visit to patients room to place on Cpap already been by once and she was drnking and eating and wanted to wait.  This time she was coughing and throwing up not able to be placed on Cpap at this time .

## 2015-09-04 NOTE — Progress Notes (Signed)
SATURATION QUALIFICATIONS: (This note is used to comply with regulatory documentation for home oxygen)  Patient Saturations on Room Air at Rest = 86%  Patient Saturations on Room Air while Ambulating = 65%  Patient Saturations on 3 Liters of oxygen while sitting = 94%  Please briefly explain why patient needs home oxygen: pt got SOB with exertion while ambulating, amb about 30 ft

## 2015-09-04 NOTE — Progress Notes (Signed)
Pt a/o,no c/o pain, pt encouraged to get oob and ambulate as well as to use the incentive spirometer to help with breathing, VSS, pt stable, pt remains on 2L

## 2015-09-04 NOTE — Care Management Note (Signed)
Case Management Note  Patient Details  Name: Yvette Gibbs MRN: [id number] Date of Birth: 08-Nov-1990  Subjective/Objective:         Admitted with Influenza           Action/Plan: Patient is independent of ADL's as prior to admission, lives at home with her grandmother, is attending school to obtain her GED. Patient has applied for disability and it is still pending. NP PCP/ no insurance; patient is agreeable to go to the [facility] at discharge. She can get her medication there also; noted - [facility] get her depression medication. CM will continue to follow for DCP;  Expected Discharge Date:    possibly 09/05/2015              Expected Discharge Plan:  Home/Self Care  Discharge planning Services  CM Consult    Status of Service:  In process, will continue to follow  Reola MosherChandler, Nollan Muldrow L, RN,MHA,BSN [phone] 09/04/2015, 9:50 AM

## 2015-09-05 DIAGNOSIS — R079 Chest pain, unspecified: Secondary | ICD-10-CM | POA: Insufficient documentation

## 2015-09-05 MED ORDER — OSELTAMIVIR PHOSPHATE 75 MG PO CAPS: 75.0000 mg | ORAL_CAPSULE | Freq: Two times a day (BID) | ORAL | Status: DC

## 2015-09-05 MED ORDER — METFORMIN HCL 500 MG PO TABS: 500.0000 mg | ORAL_TABLET | Freq: Two times a day (BID) | ORAL | Status: DC

## 2015-09-05 MED ORDER — PREDNISONE 20 MG PO TABS: ORAL_TABLET | ORAL | Status: DC

## 2015-09-05 NOTE — Plan of Care (Signed)
Problem: Food- and Nutrition-Related Knowledge Deficit (NB-1.1) Goal: Nutrition education Formal process to instruct or train a patient/client in a skill or to impart knowledge to help patients/clients voluntarily manage or modify food choices and eating behavior to maintain or improve health. Outcome: Adequate for Discharge  RD consulted for nutrition education regarding obesity and type 2 DM.    Lab Results  Component Value Date    HGBA1C 6.1* 09/02/2015   Spoke with pt at bedside. She reports that she had made a lot of changes over the past year, including eliminating fast food and salt, and consuming more baked proteins and fresh fruit. Beverages are mainly water, with occasional sweet tea (with less sugar), soda, and juice. Pt estimates she consumes one soda and one juice weekly and the majority of the beverages are water.   Pt reports it is most difficult for her to make healthy choices when she has school during the week due to her schedule. Additional, she reports it is difficult for her to resist fast foods and sweets when her classmates bring them in to share.   Per chart review, Hgb A1c was decreased from 7.8 to 6.1 within the past year. Pt has also lost 18# (4.8%) within the past year.   Case discussed with RN.  Body mass index is 63.85 kg/(m^2). Pt meets criteria for extreme obesity, class III based on current BMI.  RD provided "General Healthful Nutrition Therapy" handout from the Academy of Nutrition and Dietetics. Emphasized the importance of serving sizes and provided examples of correct portions of common foods. Discussed importance of controlled and consistent intake throughout the day. Provided examples of ways to balance meals/snacks and encouraged intake of high-fiber, whole grain complex carbohydrates. Emphasized the importance of hydration with calorie-free beverages and limiting sugar-sweetened beverages. Encouraged pt to discuss physical activity options with physician.  Teach back method used.  Expect fair to good compliance.  Current diet order is Health Healthy, patient is consuming approximately 100% of meals at this time. Labs and medications reviewed. No further nutrition interventions warranted at this time. RD contact information provided. If additional nutrition issues arise, please re-consult RD.  Carols Clemence A. Gibbs KnifeWilliams, RD, LDN, CDE Pager: [phone] After hours Pager: [phone]

## 2015-09-05 NOTE — Progress Notes (Signed)
SATURATION QUALIFICATIONS: (This note is used to comply with regulatory documentation for home oxygen)  Patient Saturations on Room Air at Rest = 83%  Patient Saturations on Room Air while Ambulating = 82%  Patient Saturations on 2 Liters of oxygen while Ambulating = 97%  Please briefly explain why patient needs home oxygen: no signs of shortness of breath while at rest only when she started ambulating.

## 2015-09-05 NOTE — Care Management Note (Addendum)
Case Management Note  Patient Details  Name: Yvette Gibbs MRN: [id number] Date of Birth: July 22, 1990  Subjective/Objective:                    Action/Plan:  [facility] on September 23, 2015 at 0930 am, patient has information   [facility] [address] [place], [place] [address]  Main: [phone]    [facility]ed [facility] to arrange follow up internal med visit , spoke with appointment scheduling they will call NCM back with appointment . They are aware patient discharging. MATCH letter given and explained . Patient voiced understanding.  Confirmed face sheet information with patient . Patient lives with her grand mother [name] [phone], patient states she does not have a cell phone the best way to reach her is to call the grandmother's phone.   Patient does not have a PCP . Called [facility] they are not scheduling any hospital follow ups or PCP appointment at present . Instructed to call [facility] for appointment . Same closed until 1300 for lunch . Patient aware and voiced understanding and stated she can go to [facility] for follow up . Will schedule appointment after 1300.  Expected Discharge Date:                  Expected Discharge Plan:  Home w Home Health Services  In-House Referral:     Discharge planning Services  CM Consult  Post Acute Care Choice:  Home Health, Durable Medical Equipment Choice offered to:  Patient  DME Arranged:  Oxygen DME Agency:  [facility]  [facility]  Status of Service:  In process, will continue to follow  Medicare Important Message Given:    Date Medicare IM Given:    Medicare IM give by:    Date Additional Medicare IM Given:    Additional Medicare Important Message give by:     If discussed at Long Length of Stay Meetings, dates discussed:    Additional  Comments:  Kingsley PlanWile, [name], RN 09/05/2015, 12:26 PM

## 2015-09-05 NOTE — Discharge Summary (Addendum)
Physician Discharge Summary  Yvette Gibbs ZOX:[id number] DOB: February 21, 1991 DOA: 09/01/2015  PCP: Jacklynn Barnacle, NP  Admit date: 09/01/2015 Discharge date: 09/05/2015  Time spent: 45 minutes  Recommendations for Outpatient Follow-up:  1. Home health RN, New Home O2 2. [facility], sent referral for PCP  Discharge Diagnoses:  Principal Problem:   Influenza A Active Problems:   Pleuritic chest pain   Morbid obesity (HCC)   Asthma exacerbation   OSA (obstructive sleep apnea)   Pain in the chest   Diabetes mellitus  Discharge Condition: stable  Diet recommendation: DIabetic  Filed Weights   09/02/15 0043 09/03/15 0656 09/04/15 0505  Weight: 159.938 kg (352 lb 9.6 oz) 160.21 kg (353 lb 3.2 oz) 158.396 kg (349 lb 3.2 oz)    History of present illness:  Chief Complaint: Chest pain and shortness of breath. HPI: Yvette Gibbs is a 25 y.o. female with history of morbid obesity, obstructive sleep apnea, asthma presented to the ER because of chest pain and shortness of breath. Patient's symptoms started 1day prior. She reported having pleuritic type of chest pain with productive cough. D-dimer and BNP were negative. Patient was febrile up to 103F  Hospital Course:  Influenza A/Asthma exacerbation: -improving, Treated with Tamiflu, nebs, prednisone -continue Prednisone taper dose quickly, complete tamiiflu course -unable to wean off O2, due to Morbid obesity, OSA/OHS/Asthma -set up with Home O2, 2-3L and Home health RN -CM made referral to [facility] for FU/PCP   Pleuritic chest pain:  -due to #1, cough -improved   Morbid obesity [facility]): Patient meets criteria with BMI greater than 40   OSA (obstructive sleep apnea)/OHS: Nightly CPAP  -weight loss advised     DM -hbaic was 7.8 in 3/16, repeat 6.1 -dietician consulted, started on metformin at discharge -made referral to [facility] for FU/PCP by CM   Discharge Exam: Filed Vitals:   09/05/15  0223 09/05/15 0635  BP:  127/78  Pulse: 95 95  Temp:  98.6 F (37 C)  Resp: 20 21    General: AAOx3 Cardiovascular: S1S2/RRR Respiratory: Improved air movement  Discharge Instructions   Discharge Instructions    Diet Carb Modified    Complete by:  As directed      Increase activity slowly    Complete by:  As directed           Current Discharge Medication List    START taking these medications   Details  metFORMIN (GLUCOPHAGE) 500 MG tablet Take 1 tablet (500 mg total) by mouth 2 (two) times daily with a meal. Qty: 60 tablet, Refills: 0    oseltamivir (TAMIFLU) 75 MG capsule Take 1 capsule (75 mg total) by mouth 2 (two) times daily. For 1day Qty: 2 capsule, Refills: 0    predniSONE (DELTASONE) 20 MG tablet Take  for 1day then  for 2days then STOP Qty: 5 tablet, Refills: 0      CONTINUE these medications which have NOT CHANGED   Details  albuterol (PROVENTIL HFA;VENTOLIN HFA) 108 (90 BASE) MCG/ACT inhaler Inhale 2 puffs into the lungs every 6 (six) hours as needed for wheezing or shortness of breath.    ibuprofen (ADVIL,MOTRIN) 200 MG tablet Take 400-600 mg by mouth every 6 (six) hours as needed (pain).    polyethylene glycol (MIRALAX / GLYCOLAX) packet Take 17 g by mouth daily as needed for mild constipation or moderate constipation. Qty: 14 each, Refills: 0      STOP taking these medications  docusate sodium (COLACE) 100 MG capsule      HYDROcodone-acetaminophen (NORCO/VICODIN) 5-325 MG per tablet        Allergies  Allergen Reactions  . Fish Allergy Itching and Rash      The results of significant diagnostics from this hospitalization (including imaging, microbiology, ancillary and laboratory) are listed below for reference.    Significant Diagnostic Studies: Dg Chest 2 View  09/01/2015  CLINICAL DATA:  Stabbing Centralized chest pain since 3am, hx of asthma EXAM: CHEST  2 VIEW COMPARISON:  None. FINDINGS: Heart size is upper limits  normal. There is pulmonary vascular congestion but no overt edema. No focal consolidations or pleural effusions are identified. Study quality is degraded by patient body habitus. IMPRESSION: Cardiomegaly and pulmonary vascular congestion. Electronically Signed   By: Norva PavlovElizabeth  Brown M.D.   On: 09/01/2015 17:35    Microbiology: Recent Results (from the past 240 hour(s))  Blood culture (routine x 2)     Status: None (Preliminary result)   Collection Time: 09/01/15  9:45 PM  Result Value Ref Range Status   Specimen Description BLOOD RIGHT ANTECUBITAL  Final   Special Requests BOTTLES DRAWN AEROBIC ONLY 5CC  Final   Culture NO GROWTH 3 DAYS  Final   Report Status PENDING  Incomplete  Blood culture (routine x 2)     Status: None (Preliminary result)   Collection Time: 09/01/15  9:50 PM  Result Value Ref Range Status   Specimen Description BLOOD RIGHT ARM  Final   Special Requests   Final    BOTTLES DRAWN AEROBIC AND ANAEROBIC 6CC BLUE 5CC PURPLE   Culture NO GROWTH 3 DAYS  Final   Report Status PENDING  Incomplete     Labs: Basic Metabolic Panel:  Recent Labs Lab 09/01/15 1725 09/02/15 0557 09/03/15 0303  NA 138 136 136  K 4.5 4.0 3.6  CL 101 99* 97*  CO2 27 25 30   GLUCOSE 96 122* 150*  BUN <5* <5* <5*  CREATININE 0.67 0.64 0.72  CALCIUM 9.5 8.8* 8.9   Liver Function Tests:  Recent Labs Lab 09/01/15 2155  AST 26  ALT 27  ALKPHOS 66  BILITOT 0.5  PROT 7.2  ALBUMIN 3.0*    Recent Labs Lab 09/01/15 2155  LIPASE 21   No results for input(s): AMMONIA in the last 168 hours. CBC:  Recent Labs Lab 09/01/15 1725 09/02/15 0557 09/03/15 0303  WBC 5.6 5.0 4.9  HGB 11.6* 11.1* 10.7*  HCT 39.1 36.2 37.0  MCV 87.1 87.2 89.4  PLT 274 225 226   Cardiac Enzymes:  Recent Labs Lab 09/01/15 1725 09/01/15 2155 09/02/15 0557 09/02/15 1045  TROPONINI <0.03 <0.03 <0.03 <0.03   BNP: BNP (last 3 results)  Recent Labs  09/01/15 2015  BNP 21.4    ProBNP (last 3  results) No results for input(s): PROBNP in the last 8760 hours.  CBG: No results for input(s): GLUCAP in the last 168 hours.     SignedZannie Cove:  Estera Ozier MD.  [facility] 09/05/2015, 10:53 AM

## 2015-09-05 NOTE — Progress Notes (Signed)
Patient discharged to home with instructions. Also sent with O2 tank and prescriptions as ordered.

## 2015-09-06 ENCOUNTER — Emergency Department (HOSPITAL_COMMUNITY): Payer: Self-pay

## 2015-09-06 ENCOUNTER — Encounter (HOSPITAL_COMMUNITY): Payer: Self-pay

## 2015-09-06 ENCOUNTER — Inpatient Hospital Stay (HOSPITAL_COMMUNITY)
Admission: EM | Admit: 2015-09-06 | Discharge: 2015-09-10 | DRG: 871 | Disposition: A | Discharge status: Home / Self Care | Payer: Self-pay | Attending: Internal Medicine | Admitting: Internal Medicine

## 2015-09-06 DIAGNOSIS — Z79899 Other long term (current) drug therapy: Secondary | ICD-10-CM

## 2015-09-06 DIAGNOSIS — Y95 Nosocomial condition: Secondary | ICD-10-CM | POA: Diagnosis present

## 2015-09-06 DIAGNOSIS — J45909 Unspecified asthma, uncomplicated: Secondary | ICD-10-CM | POA: Diagnosis present

## 2015-09-06 DIAGNOSIS — A419 Sepsis, unspecified organism: Principal | ICD-10-CM | POA: Diagnosis present

## 2015-09-06 DIAGNOSIS — R0902 Hypoxemia: Secondary | ICD-10-CM

## 2015-09-06 DIAGNOSIS — IMO0002 Reserved for concepts with insufficient information to code with codable children: Secondary | ICD-10-CM | POA: Diagnosis present

## 2015-09-06 DIAGNOSIS — Z9981 Dependence on supplemental oxygen: Secondary | ICD-10-CM

## 2015-09-06 DIAGNOSIS — J45901 Unspecified asthma with (acute) exacerbation: Secondary | ICD-10-CM | POA: Diagnosis present

## 2015-09-06 DIAGNOSIS — J189 Pneumonia, unspecified organism: Secondary | ICD-10-CM | POA: Diagnosis present

## 2015-09-06 DIAGNOSIS — F329 Major depressive disorder, single episode, unspecified: Secondary | ICD-10-CM | POA: Diagnosis present

## 2015-09-06 DIAGNOSIS — F79 Unspecified intellectual disabilities: Secondary | ICD-10-CM | POA: Diagnosis present

## 2015-09-06 DIAGNOSIS — G4733 Obstructive sleep apnea (adult) (pediatric): Secondary | ICD-10-CM | POA: Diagnosis present

## 2015-09-06 DIAGNOSIS — E1165 Type 2 diabetes mellitus with hyperglycemia: Secondary | ICD-10-CM | POA: Diagnosis present

## 2015-09-06 DIAGNOSIS — Z7984 Long term (current) use of oral hypoglycemic drugs: Secondary | ICD-10-CM

## 2015-09-06 DIAGNOSIS — R6520 Severe sepsis without septic shock: Secondary | ICD-10-CM | POA: Diagnosis present

## 2015-09-06 LAB — CULTURE, BLOOD (ROUTINE X 2)
Culture: NO GROWTH
Culture: NO GROWTH

## 2015-09-06 NOTE — ED Notes (Signed)
Pt here via [facility] with c/o cough, was recently dx with the flu on Sunday and placed on 3L of oxygen at home. She reports chest discomfort from her productive cough of yellow sputum. BP- 170/120, HR- 104, CBG-135, O2- 92% on [place] and 99% 3L [place]. Lungs sounds congested per EMS.

## 2015-09-07 DIAGNOSIS — A419 Sepsis, unspecified organism: Secondary | ICD-10-CM | POA: Diagnosis present

## 2015-09-07 DIAGNOSIS — J45909 Unspecified asthma, uncomplicated: Secondary | ICD-10-CM | POA: Diagnosis present

## 2015-09-07 DIAGNOSIS — R6520 Severe sepsis without septic shock: Secondary | ICD-10-CM | POA: Diagnosis present

## 2015-09-07 DIAGNOSIS — J189 Pneumonia, unspecified organism: Secondary | ICD-10-CM | POA: Diagnosis present

## 2015-09-07 DIAGNOSIS — G4733 Obstructive sleep apnea (adult) (pediatric): Secondary | ICD-10-CM

## 2015-09-07 LAB — CBC WITH DIFFERENTIAL/PLATELET
Basophils Absolute: 0 10*3/uL (ref 0.0–0.1)
Basophils Relative: 0 %
Eosinophils Absolute: 0.1 10*3/uL (ref 0.0–0.7)
Eosinophils Relative: 1 %
HCT: 36.5 % (ref 36.0–46.0)
Hemoglobin: 11 g/dL — ABNORMAL LOW (ref 12.0–15.0)
Lymphocytes Relative: 33 %
Lymphs Abs: 2.7 10*3/uL (ref 0.7–4.0)
MCH: 27.1 pg (ref 26.0–34.0)
MCHC: 30.1 g/dL (ref 30.0–36.0)
MCV: 89.9 fL (ref 78.0–100.0)
Monocytes Absolute: 1 10*3/uL (ref 0.1–1.0)
Monocytes Relative: 12 %
Neutro Abs: 4.3 10*3/uL (ref 1.7–7.7)
Neutrophils Relative %: 54 %
Platelets: 270 10*3/uL (ref 150–400)
RBC: 4.06 MIL/uL (ref 3.87–5.11)
RDW: 14.4 % (ref 11.5–15.5)
WBC: 8.1 10*3/uL (ref 4.0–10.5)

## 2015-09-07 LAB — GLUCOSE, CAPILLARY
Glucose-Capillary: 144 mg/dL — ABNORMAL HIGH (ref 65–99)
Glucose-Capillary: 188 mg/dL — ABNORMAL HIGH (ref 65–99)
Glucose-Capillary: 256 mg/dL — ABNORMAL HIGH (ref 65–99)
Glucose-Capillary: 194 mg/dL — ABNORMAL HIGH (ref 65–99)

## 2015-09-07 LAB — HCG, QUANTITATIVE, PREGNANCY: hCG, Beta Chain, Quant, S: <1 m[IU]/mL (ref <5)

## 2015-09-07 LAB — PROCALCITONIN: Procalcitonin: <0.1 ng/mL

## 2015-09-07 LAB — APTT: aPTT: 30 seconds (ref 24–37)

## 2015-09-07 LAB — BASIC METABOLIC PANEL
Anion gap: 10 (ref 5–15)
BUN: 7 mg/dL (ref 6–20)
CO2: 31 mmol/L (ref 22–32)
Calcium: 8.8 mg/dL — ABNORMAL LOW (ref 8.9–10.3)
Chloride: 97 mmol/L — ABNORMAL LOW (ref 101–111)
Creatinine, Ser: 0.62 mg/dL (ref 0.44–1.00)
GFR calc Af Amer: >60 mL/min (ref >60)
GFR calc non Af Amer: >60 mL/min (ref >60)
Glucose, Bld: 101 mg/dL — ABNORMAL HIGH (ref 65–99)
Potassium: 4.1 mmol/L (ref 3.5–5.1)
Sodium: 138 mmol/L (ref 135–145)

## 2015-09-07 LAB — PROTIME-INR
INR: 1.24 (ref 0.00–1.49)
Prothrombin Time: 15.8 seconds — ABNORMAL HIGH (ref 11.6–15.2)

## 2015-09-07 LAB — LACTIC ACID, PLASMA
Lactic Acid, Venous: 0.7 mmol/L (ref 0.5–2.0)
Lactic Acid, Venous: 0.7 mmol/L (ref 0.5–2.0)

## 2015-09-07 MED ORDER — SODIUM CHLORIDE 0.9 % IV SOLN
INTRAVENOUS | Status: AC
  Administered 2015-09-07: 08:00:00 via INTRAVENOUS

## 2015-09-07 MED ORDER — PIPERACILLIN-TAZOBACTAM 3.375 G IVPB 30 MIN
3.3750 g | Freq: Once | INTRAVENOUS | Status: AC
  Administered 2015-09-07: 3.375 g via INTRAVENOUS
  Filled 2015-09-07: qty 50

## 2015-09-07 MED ORDER — METHYLPREDNISOLONE SODIUM SUCC 125 MG IJ SOLR
60.0000 mg | Freq: Two times a day (BID) | INTRAMUSCULAR | Status: DC
  Administered 2015-09-07 – 2015-09-09 (×5): 60 mg via INTRAVENOUS
  Filled 2015-09-07 (×5): qty 2

## 2015-09-07 MED ORDER — IBUPROFEN 400 MG PO TABS
400.0000 mg | ORAL_TABLET | Freq: Four times a day (QID) | ORAL | Status: DC | PRN
  Administered 2015-09-07 – 2015-09-08 (×3): 400 mg via ORAL
  Administered 2015-09-10: 600 mg via ORAL
  Filled 2015-09-07: qty 2
  Filled 2015-09-07 (×4): qty 1

## 2015-09-07 MED ORDER — PIPERACILLIN-TAZOBACTAM 3.375 G IVPB
3.3750 g | Freq: Three times a day (TID) | INTRAVENOUS | Status: DC
  Administered 2015-09-07 – 2015-09-08 (×5): 3.375 g via INTRAVENOUS
  Filled 2015-09-07 (×6): qty 50

## 2015-09-07 MED ORDER — IPRATROPIUM BROMIDE 0.02 % IN SOLN
0.5000 mg | RESPIRATORY_TRACT | Status: DC
  Administered 2015-09-07 (×5): 0.5 mg via RESPIRATORY_TRACT
  Filled 2015-09-07 (×4): qty 2.5

## 2015-09-07 MED ORDER — ENOXAPARIN SODIUM 80 MG/0.8ML ~~LOC~~ SOLN
80.0000 mg | SUBCUTANEOUS | Status: DC
  Administered 2015-09-07 – 2015-09-08 (×2): 80 mg via SUBCUTANEOUS
  Filled 2015-09-07 (×2): qty 0.8

## 2015-09-07 MED ORDER — SODIUM CHLORIDE 0.9 % IV BOLUS (SEPSIS)
1000.0000 mL | Freq: Once | INTRAVENOUS | Status: AC
  Administered 2015-09-07: 1000 mL via INTRAVENOUS

## 2015-09-07 MED ORDER — PIPERACILLIN-TAZOBACTAM 3.375 G IVPB 30 MIN: 3.3750 g | Freq: Three times a day (TID) | INTRAVENOUS | Status: DC

## 2015-09-07 MED ORDER — LEVALBUTEROL HCL 1.25 MG/0.5ML IN NEBU
1.2500 mg | INHALATION_SOLUTION | Freq: Four times a day (QID) | RESPIRATORY_TRACT | Status: DC
  Administered 2015-09-07 (×5): 1.25 mg via RESPIRATORY_TRACT
  Filled 2015-09-07 (×5): qty 0.5

## 2015-09-07 MED ORDER — DM-GUAIFENESIN ER 30-600 MG PO TB12
1.0000 | ORAL_TABLET | Freq: Two times a day (BID) | ORAL | Status: DC
  Administered 2015-09-07 – 2015-09-10 (×7): 1 via ORAL
  Filled 2015-09-07 (×7): qty 1

## 2015-09-07 MED ORDER — INSULIN ASPART 100 UNIT/ML ~~LOC~~ SOLN
0.0000 [IU] | Freq: Three times a day (TID) | SUBCUTANEOUS | Status: DC
  Administered 2015-09-07: 5 [IU] via SUBCUTANEOUS
  Administered 2015-09-07 – 2015-09-08 (×2): 2 [IU] via SUBCUTANEOUS
  Administered 2015-09-08 – 2015-09-09 (×3): 3 [IU] via SUBCUTANEOUS
  Administered 2015-09-09: 2 [IU] via SUBCUTANEOUS
  Administered 2015-09-09: 1 [IU] via SUBCUTANEOUS
  Administered 2015-09-10: 2 [IU] via SUBCUTANEOUS
  Administered 2015-09-10: 1 [IU] via SUBCUTANEOUS

## 2015-09-07 MED ORDER — LEVALBUTEROL HCL 1.25 MG/0.5ML IN NEBU
1.2500 mg | INHALATION_SOLUTION | Freq: Four times a day (QID) | RESPIRATORY_TRACT | Status: DC
  Administered 2015-09-08 – 2015-09-09 (×4): 1.25 mg via RESPIRATORY_TRACT
  Filled 2015-09-07 (×6): qty 0.5

## 2015-09-07 MED ORDER — VANCOMYCIN HCL 10 G IV SOLR
1500.0000 mg | INTRAVENOUS | Status: AC
  Administered 2015-09-07: 1500 mg via INTRAVENOUS
  Filled 2015-09-07 (×2): qty 1500

## 2015-09-07 MED ORDER — VANCOMYCIN HCL IN DEXTROSE 1-5 GM/200ML-% IV SOLN
1000.0000 mg | Freq: Once | INTRAVENOUS | Status: AC
  Administered 2015-09-07: 1000 mg via INTRAVENOUS
  Filled 2015-09-07: qty 200

## 2015-09-07 MED ORDER — HYDRALAZINE HCL 20 MG/ML IJ SOLN
10.0000 mg | Freq: Four times a day (QID) | INTRAMUSCULAR | Status: DC | PRN
  Administered 2015-09-07 – 2015-09-08 (×2): 10 mg via INTRAVENOUS
  Filled 2015-09-07 (×3): qty 1

## 2015-09-07 MED ORDER — INSULIN ASPART 100 UNIT/ML ~~LOC~~ SOLN: 0.0000 [IU] | Freq: Every day | SUBCUTANEOUS | Status: DC

## 2015-09-07 MED ORDER — VANCOMYCIN HCL 10 G IV SOLR
1500.0000 mg | Freq: Three times a day (TID) | INTRAVENOUS | Status: DC
  Administered 2015-09-07 – 2015-09-08 (×4): 1500 mg via INTRAVENOUS
  Filled 2015-09-07 (×5): qty 1500

## 2015-09-07 MED ORDER — IPRATROPIUM BROMIDE 0.02 % IN SOLN
0.5000 mg | Freq: Four times a day (QID) | RESPIRATORY_TRACT | Status: DC
  Administered 2015-09-08 – 2015-09-09 (×4): 0.5 mg via RESPIRATORY_TRACT
  Filled 2015-09-07 (×6): qty 2.5

## 2015-09-07 NOTE — Progress Notes (Signed)
Patient seen and evaluated earlier this a.m. and currently being treated for asthma exacerbation. Current regimen reviewed and would continue current medical regimen. For any details please refer to H&P for details regarding assessment and plan. For now we'll continue current antibiotic regimen. Of note patient had chest x-ray which reported nodular airspace infiltrates in both midlungs suggesting progressing multifocal pneumonia. With resolution of wheezes will plan on discontinuing steroid regimen in lieu of active infection.  Will reassess next am or sooner should any new concerns arise.  Seri Kimmer  GEN: Pt in nad, alert and awake Pulm: Equal chest rise, good airflow with mild expiratory wheezes, + rhales Abd: obese, nt, nd

## 2015-09-07 NOTE — H&P (Signed)
[facility] History and Physical  Danyella Griep ZOX:[id number] DOB: February 05, 1991 DOA: 09/06/2015  Referring physician: ED physician PCP: Jacklynn BarnaclePLACEY,MARY H, NP  Specialists:   Chief Complaint: productive cough and shortness of breath  HPI: Yvette Gibbs is a 25 y.o. female with PMH of Mental retardation, depression, asthma, OSA, morbid obesity and DM, who presents with productive cough and shortness of breath.  Pt was recently admitted and treated for influenza (09/01/15 to 09/05/15), discharged on home O2 for persistent hypoxia. She was using her oxygen today as instructed but reports she did not fill her prescriptions due to financial constraints. She reports that she her shortness of breath has been getting worse. She coughs up yellow colored sputum. She has chills and feels hot. Patient has HA and mild front chest pain which is induced by coughing. No tenderness over calf areas. She has headache. No abdominal pain, diarrhea, symptoms of UTI or unilateral weakness.  In ED, patient was found to have WBC 8.1, temperature 99.1, tachycardia, tachypnea, electrolytes and renal function okay. Chest x-ray showed bilateral mid lung infiltration. Patient is admitted treating patient for further events and cinnamon.  EKG: Not done in ED, will get one.   Where does patient live?   At home   SNF    Assistant living facility   [facility] Can patient participate in ADLs?  Yes     Barely     None  Little     Some   Review of Systems:   General: feels not and has chills, no changes in body weight, has poor appetite, has fatigue HEENT: no blurry vision, hearing changes or sore throat Pulm: has dyspnea, coughing, wheezing CV: has chest pain, no palpitations Abd: no nausea, vomiting, abdominal pain, diarrhea, constipation GU: no dysuria, burning on urination, increased urinary frequency, hematuria  Ext: no leg edema Neuro: no unilateral weakness, numbness, or tingling, no vision change or  hearing loss Skin: no rash MSK: No muscle spasm, no deformity, no limitation of range of movement in spin Heme: No easy bruising.  Travel history: No recent long distant travel.  Allergy:  Allergies  Allergen Reactions  . Fish Allergy Itching and Rash    Past Medical History  Diagnosis Date  . Mental disorder   . Depression   . Asthma   . OSA (obstructive sleep apnea)   . Extreme obesity (HCC)   . Diabetes mellitus type 2, uncontrolled (HCC)   . Family history of adverse reaction to anesthesia     " my grandmother had an allergic reaction    Past Surgical History  Procedure Laterality Date  . Appendectomy    . Tonsillectomy    . Cholecystectomy N/A 09/05/2014    Procedure: LAPAROSCOPIC CHOLECYSTECTOMY;  Surgeon: Axel FillerArmando Ramirez, MD;  [facility];  Service: General;  Laterality: N/A;    Social History:  reports that she has never smoked. She has never used smokeless tobacco. She reports that she does not drink alcohol or use illicit drugs.  Family History:  Family History  Problem Relation Age of Onset  . Diabetes Mellitus II Maternal Grandmother      Prior to Admission medications   Medication Sig Start Date End Date Taking? Authorizing Provider  albuterol (PROVENTIL HFA;VENTOLIN HFA) 108 (90 BASE) MCG/ACT inhaler Inhale 2 puffs into the lungs every 6 (six) hours as needed for wheezing or shortness of breath.   Yes Historical Provider, MD  ibuprofen (ADVIL,MOTRIN) 200 MG tablet Take 400-600 mg by mouth every 6 (six)  hours as needed (pain).   Yes Historical Provider, MD  metFORMIN (GLUCOPHAGE) 500 MG tablet Take 1 tablet (500 mg total) by mouth 2 (two) times daily with a meal. 09/05/15  Yes Zannie Cove, MD  oseltamivir (TAMIFLU) 75 MG capsule Take 1 capsule (75 mg total) by mouth 2 (two) times daily. For 1day 09/05/15  Yes Zannie Cove, MD  predniSONE (DELTASONE) 20 MG tablet Take  for 1day then  for 2days then STOP 09/05/15  Yes Zannie Cove, MD   polyethylene glycol (MIRALAX / GLYCOLAX) packet Take 17 g by mouth daily as needed for mild constipation or moderate constipation. Patient not taking: Reported on 09/02/2015 09/06/14   Nonie Hoyer, PA-C    Physical Exam: Filed Vitals:   09/07/15 0224 09/07/15 0312 09/07/15 0326 09/07/15 0330  BP: 116/99  159/95 136/94  Pulse:      Temp:      TempSrc:      Resp: SpO2:  97%     General: Not in acute distress HEENT:       Eyes: PERRL, EOMI, no scleral icterus.       ENT: No discharge from the ears and nose, no pharynx injection, no tonsillar enlargement.        Neck: No JVD, no bruit, no mass felt. Heme: No neck lymph node enlargement. Cardiac: S1/S2, RRR, No murmurs, No gallops or rubs. Pulm: has wheezing bilaterally. No rales or rubs. Abd: Soft, nondistended, nontender, no rebound pain, no organomegaly, BS present. Ext: No pitting leg edema bilaterally. 2+DP/PT pulse bilaterally. Musculoskeletal: No joint deformities, No joint redness or warmth, no limitation of ROM in spin. Skin: No rashes.  Neuro: Alert, oriented X3, cranial nerves II-XII grossly intact, moves all extremities normally.  Psych: Patient is not psychotic, no suicidal or hemocidal ideation.  Labs on Admission:  Basic Metabolic Panel:  Recent Labs Lab 09/01/15 1725 09/02/15 0557 09/03/15 0303 09/07/15 0115  NA 138 136 136 138  K 4.5 4.0 3.6 4.1  CL 101 99* 97* 97*  CO2 GLUCOSE 96 122* 150* 101*  BUN <5* <5* <5* 7  CREATININE 0.67 0.64 0.72 0.62  CALCIUM 9.5 8.8* 8.9 8.8*   Liver Function Tests:  Recent Labs Lab 09/01/15 2155  AST 26  ALT 27  ALKPHOS 66  BILITOT 0.5  PROT 7.2  ALBUMIN 3.0*    Recent Labs Lab 09/01/15 2155  LIPASE 21   No results for input(s): AMMONIA in the last 168 hours. CBC:  Recent Labs Lab 09/01/15 1725 09/02/15 0557 09/03/15 0303 09/07/15 0115  WBC 5.6 5.0 4.9 8.1  NEUTROABS  --   --   --  4.3  HGB 11.6* 11.1* 10.7* 11.0*  HCT  39.1 36.2 37.0 36.5  MCV 87.1 87.2 89.4 89.9  PLT 274 225 226 270   Cardiac Enzymes:  Recent Labs Lab 09/01/15 1725 09/01/15 2155 09/02/15 0557 09/02/15 1045  TROPONINI <0.03 <0.03 <0.03 <0.03    BNP (last 3 results)  Recent Labs  09/01/15 2015  BNP 21.4    ProBNP (last 3 results) No results for input(s): PROBNP in the last 8760 hours.  CBG: No results for input(s): GLUCAP in the last 168 hours.  Radiological Exams on Admission: Dg Chest 2 View  09/06/2015  CLINICAL DATA:  Patient was discharged on 09/05/2015 with fluid. Now feels worse. Chest pain. EXAM: CHEST  2 VIEW COMPARISON:  09/01/2015 FINDINGS: Shallow inspiration. Cardiac enlargement. Developing patchy airspace  infiltrates in both mid lung suggesting developing multifocal pneumonia. No blunting of costophrenic angles. No pneumothorax. IMPRESSION: Developing nodular airspace infiltrates in both mid lungs suggesting progressing multifocal pneumonia. Electronically Signed   By: Burman Nieves M.D.   On: 09/06/2015 23:21    Assessment/Plan Principal Problem:   HCAP (healthcare-associated pneumonia) Active Problems:   Diabetes mellitus type 2, uncontrolled (HCC)   Morbid obesity (HCC)   Asthma exacerbation   OSA (obstructive sleep apnea)   Sepsis (HCC)   Asthma  HCAP and sepsis: patient's productive cough, chest pain plus chest x-ray findings are consistent with HCAP. She meets criteria for sepsis with tachycardia and tachypnea. Lactate is normal. Hemodynamically stable.  - Will admit to Telemetry Bed for observation - ED started IV Vancomycin and Zosyn, will continue - Mucinex for cough  - Atroven and Xopenex Neb prn for SOB - Urine legionella and S. pneumococcal antigen - Follow up blood culture x2, sputum culture and respiratory virus panel - will get Procalcitonin and trend lactic acid level per sepsis protocol - IVF: 1L of NS bolus in ED, followed by 125 mL per hour of NS   Asthma exacerbation:  patient has wheezing bilaterally, indicating asthma exacerbation. -see above for breathing treatment and antibiotics -Solu-Medrol 60 mg twice a day  DM-II: Last A1c 6.1 on 09/02/15,  well controled. Patient is taking metforminat home -SSI  OSA: -CPAP  DVT ppx: SQ Lovenox  Code Status: Full code Family Communication: None at bed side.   Disposition Plan: Admit to inpatient   Date of Service 09/07/2015    Lorretta Harp [facility] Pager [phone]  If 7PM-7AM, please contact night-coverage [email or web address] Password [facility] 09/07/2015, 4:16 AM

## 2015-09-07 NOTE — ED Notes (Signed)
Unable to collect 2nd set cultures, 1st set was sent off

## 2015-09-07 NOTE — Progress Notes (Signed)
Pharmacy Antibiotic Note  Yvette Gibbs is a 25 y.o. female admitted on 09/06/2015 with pneumonia. Pt was recently admitted and treated for influenza (09/01/15 to 09/05/15), discharged on home O2 for persistent hypoxia. Pharmacy has been consulted for Vancomycin and Zosyn dosing.  Vanc1gm and Zosyn 3.375gm given  In ED ~0130  Plan: Zosyn 3.375gm IV q8h - doses over 4 hours Vancomycin 1.5gm now (in addition to 1gm already given to equal 2.5gm loading dose) then 1500mg  IV q8 Will f/u micro data, renal function, and pt's clinical condition Vanc trough prn     Temp (24hrs), Avg:99 F (37.2 C), Min:98.9 F (37.2 C), Max:99.1 F (37.3 C)   Recent Labs Lab 09/01/15 1725 09/01/15 2155 09/02/15 0050 09/02/15 0557 09/03/15 0303 09/07/15 0115 09/07/15 0145  WBC 5.6  --   --  5.0 4.9 8.1  --   CREATININE 0.67  --   --  0.64 0.72 0.62  --   LATICACIDVEN  --  0.8 1.9  --   --   --  0.7    Estimated Creatinine Clearance: 162.6 mL/min (by C-G formula based on Cr of 0.62).    Allergies  Allergen Reactions  . Fish Allergy Itching and Rash    Antimicrobials this admission: 3/25 Vanc >>  3/25 Zosyn >>   Dose adjustments this admission: n/a  Microbiology results: 3/25 BCx:   RSV:   Thank you for allowing pharmacy to be a part of this patient's care.  Christoper Fabianaron Donicia Druck, PharmD, BCPS Clinical pharmacist, pager [phone] 09/07/2015 4:09 AM

## 2015-09-07 NOTE — ED Provider Notes (Signed)
CSN: [id number]     Arrival date & time 09/06/15  2244 History   First MD Initiated Contact with Patient 09/06/15 2359     Chief Complaint  Patient presents with  . Cough     (Consider location/radiation/quality/duration/timing/severity/associated sxs/prior Treatment) HPI Comments: Patient with a history of MR, depression, asthma, OSA, morbid obesity and DM presents with complaint of chest tightness and SOB today. She was recently admitted and treated for influenza (09/01/15 to 09/05/15), discharged on home O2 for persistent hypoxia. She was using her oxygen today as instructed but reports she did not fill her prescriptions due to financial constraints. She reports that she woke today feeling more SOB and that this worsened through the day. She has a cough that she notes is productive. She has had hot/cold chills but does not take her temperature at home. No vomiting.   Patient is a 25 y.o. female presenting with cough. The history is provided by the patient. No language interpreter was used.  Cough Cough characteristics:  Productive Severity:  Moderate Associated symptoms: chest pain, chills and shortness of breath   Associated symptoms: no rash     Past Medical History  Diagnosis Date  . Mental disorder   . Depression   . Asthma   . OSA (obstructive sleep apnea)   . Extreme obesity (HCC)   . Diabetes mellitus type 2, uncontrolled (HCC)   . Family history of adverse reaction to anesthesia     " my grandmother had an allergic reaction   Past Surgical History  Procedure Laterality Date  . Appendectomy    . Tonsillectomy    . Cholecystectomy N/A 09/05/2014    Procedure: LAPAROSCOPIC CHOLECYSTECTOMY;  Surgeon: Axel FillerArmando Ramirez, MD;  [facility];  Service: General;  Laterality: N/A;   Family History  Problem Relation Age of Onset  . Diabetes Mellitus II Maternal Grandmother    Social History  Substance Use Topics  . Smoking status: Never Smoker   . Smokeless tobacco: Never  Used  . Alcohol Use: No   OB History    No data available     Review of Systems  Constitutional: Positive for chills.  Respiratory: Positive for cough, chest tightness and shortness of breath.   Cardiovascular: Positive for chest pain.  Gastrointestinal: Negative for nausea, vomiting and abdominal pain.  Musculoskeletal: Negative for neck stiffness.  Skin: Negative for rash.  Neurological: Positive for weakness. Negative for syncope.      Allergies  Fish allergy  Home Medications   Prior to Admission medications   Medication Sig Start Date End Date Taking? Authorizing Provider  albuterol (PROVENTIL HFA;VENTOLIN HFA) 108 (90 BASE) MCG/ACT inhaler Inhale 2 puffs into the lungs every 6 (six) hours as needed for wheezing or shortness of breath.   Yes Historical Provider, MD  ibuprofen (ADVIL,MOTRIN) 200 MG tablet Take 400-600 mg by mouth every 6 (six) hours as needed (pain).   Yes Historical Provider, MD  metFORMIN (GLUCOPHAGE) 500 MG tablet Take 1 tablet (500 mg total) by mouth 2 (two) times daily with a meal. 09/05/15  Yes Zannie CovePreetha Joseph, MD  oseltamivir (TAMIFLU) 75 MG capsule Take 1 capsule (75 mg total) by mouth 2 (two) times daily. For 1day 09/05/15  Yes Zannie CovePreetha Joseph, MD  predniSONE (DELTASONE) 20 MG tablet Take 40mg  for 1day then 20mg  for 2days then STOP 09/05/15  Yes Zannie CovePreetha Joseph, MD  polyethylene glycol (MIRALAX / GLYCOLAX) packet Take 17 g by mouth daily as needed for mild constipation or moderate constipation.  Patient not taking: Reported on 09/02/2015 09/06/14   Nonie Hoyer, PA-C   BP 124/83 mmHg  Pulse 102  Temp(Src) 99.1 F (37.3 C) (Oral)  Resp 19  SpO2 98%  LMP 09/06/2015 Physical Exam  Constitutional: She is oriented to person, place, and time. She appears well-developed and well-nourished. No distress.  Morbidly obese patient on oxygen by [place].  Eyes: Conjunctivae are normal.  Neck: Neck supple.  Cardiovascular: Regular rhythm.  Tachycardia present.    Pulmonary/Chest: Effort normal. She has wheezes.  Diminished breath sounds.  Abdominal: Soft. There is no tenderness.  Musculoskeletal: Normal range of motion.  Neurological: She is alert and oriented to person, place, and time.  Skin: Skin is warm and dry.    ED Course  Procedures (including critical care time) Labs Review Labs Reviewed  CULTURE, BLOOD (ROUTINE X 2)  CULTURE, BLOOD (ROUTINE X 2)  CBC WITH DIFFERENTIAL/PLATELET  BASIC METABOLIC PANEL  LACTIC ACID, PLASMA  LACTIC ACID, PLASMA    Imaging Review Dg Chest 2 View  09/06/2015  CLINICAL DATA:  Patient was discharged on 09/05/2015 with fluid. Now feels worse. Chest pain. EXAM: CHEST  2 VIEW COMPARISON:  09/01/2015 FINDINGS: Shallow inspiration. Cardiac enlargement. Developing patchy airspace infiltrates in both mid lung suggesting developing multifocal pneumonia. No blunting of costophrenic angles. No pneumothorax. IMPRESSION: Developing nodular airspace infiltrates in both mid lungs suggesting progressing multifocal pneumonia. Electronically Signed   By: Burman Nieves M.D.   On: 09/06/2015 23:21   I have personally reviewed and evaluated these images and lab results as part of my medical decision-making.   EKG Interpretation None      MDM   Final diagnoses:  None    1. Multi-lobar HCAP  2. Oxygen dependent 3. Morbid obesity  The patient is alert and oriented. She shows poor insight into her condition. Her CXR today shows bilateral pneumonia not previously seen on recent admission, considered HCAP secondary to recent admission. Lab studies pending, IV abx ordered. She is stable. Plan for re-admission.   Discussed with the hospitalist who accepts the patient onto their service.     Elpidio Anis, PA-C 09/10/15 2350  Courteney Randall An, MD 09/12/15 1610

## 2015-09-08 LAB — GLUCOSE, CAPILLARY
Glucose-Capillary: 214 mg/dL — ABNORMAL HIGH (ref 65–99)
Glucose-Capillary: 177 mg/dL — ABNORMAL HIGH (ref 65–99)
Glucose-Capillary: 231 mg/dL — ABNORMAL HIGH (ref 65–99)
Glucose-Capillary: 178 mg/dL — ABNORMAL HIGH (ref 65–99)

## 2015-09-08 LAB — STREP PNEUMONIAE URINARY ANTIGEN: Strep Pneumo Urinary Antigen: NEGATIVE

## 2015-09-08 MED ORDER — LEVOFLOXACIN 750 MG PO TABS
750.0000 mg | ORAL_TABLET | Freq: Every day | ORAL | Status: DC
  Administered 2015-09-08 – 2015-09-10 (×3): 750 mg via ORAL
  Filled 2015-09-08 (×2): qty 1

## 2015-09-08 NOTE — Progress Notes (Signed)
[facility] PROGRESS NOTE  Yvette Gibbs ZOX:[id number] DOB: December 06, 1990 DOA: 09/06/2015 PCP: Jacklynn Barnacle, NP  Assessment/Plan: Principal Problem:   HCAP (healthcare-associated pneumonia) - Patient is currently on broad-spectrum antibiotics. Given improvement in respiratory condition we'll plan on narrowing antibiotic regimen. This will be narrowed to Levaquin as discussed with pharmacy -Patient reports that she is on 3 L oxygen at home - Strep pna urinary antigen negative, Legionella urinary antigen pending. - Respiratory virus panel in process  Active Problems:   Diabetes mellitus type 2, uncontrolled (HCC) - Last hemoglobin A1c 6.1. Continue current regimen -Continue carb modified diet    Morbid obesity (HCC)   Asthma exacerbation - Improving on current regimen. May consider decreasing the steroid regimen next a.m. with continued improvement in condition    OSA (obstructive sleep apnea)  - continue supplemental oxygen  Code Status: full Family Communication: No family at bedside Disposition Plan: If improved respiratory condition may consider discharging in the next one or 2 days.   Consultants:  None  Procedures:  none  Antibiotics:  Vancomycin and Zosyn>>>Levaquin 3/26  HPI/Subjective: Patient reports improvement in breathing condition. Was inquiring about starting insulin for her diabetes. She states she does not have glucose meter at home and would like a prescription for one on discharge. I discussed that since her hemoglobin A1c is 6.1 oral hypoglycemic agents would be adequate for management of diabetes at this moment.  Objective: Filed Vitals:   09/08/15 0412 09/08/15 1329  BP: 131/60 142/82  Pulse: 85 97  Temp: 97.9 F (36.6 C) 98.2 F (36.8 C)  Resp: 20 20    Intake/Output Summary (Last 24 hours) at 09/08/15 1334 Last data filed at 09/08/15 1300  Gross per 24 hour  Intake   1680 ml  Output   2354 ml  Net   -674 ml   There were no  vitals filed for this visit.  Exam:   General:  Patient in no acute distress, alert and awake  Cardiovascular: no cyanosis  Respiratory: no increased wob, mild exp wheezes, equal chest rise  Abdomen: soft, obese, nt,  Musculoskeletal: no cyanosis or clubbing   Data Reviewed: Basic Metabolic Panel:  Recent Labs Lab 09/01/15 1725 09/02/15 0557 09/03/15 0303 09/07/15 0115  NA 138 136 136 138  K 4.5 4.0 3.6 4.1  CL 101 99* 97* 97*  CO2 GLUCOSE 96 122* 150* 101*  BUN <5* <5* <5* 7  CREATININE 0.67 0.64 0.72 0.62  CALCIUM 9.5 8.8* 8.9 8.8*   Liver Function Tests:  Recent Labs Lab 09/01/15 2155  AST 26  ALT 27  ALKPHOS 66  BILITOT 0.5  PROT 7.2  ALBUMIN 3.0*    Recent Labs Lab 09/01/15 2155  LIPASE 21   No results for input(s): AMMONIA in the last 168 hours. CBC:  Recent Labs Lab 09/01/15 1725 09/02/15 0557 09/03/15 0303 09/07/15 0115  WBC 5.6 5.0 4.9 8.1  NEUTROABS  --   --   --  4.3  HGB 11.6* 11.1* 10.7* 11.0*  HCT 39.1 36.2 37.0 36.5  MCV 87.1 87.2 89.4 89.9  PLT 274 225 226 270   Cardiac Enzymes:  Recent Labs Lab 09/01/15 1725 09/01/15 2155 09/02/15 0557 09/02/15 1045  TROPONINI <0.03 <0.03 <0.03 <0.03   BNP (last 3 results)  Recent Labs  09/01/15 2015  BNP 21.4    ProBNP (last 3 results) No results for input(s): PROBNP in the last 8760 hours.  CBG:  Recent Labs Lab 09/07/15  1124 09/07/15 1627 09/07/15 2110 09/08/15 0557 09/08/15 1109  GLUCAP 188* 256* 194* 214* 177*    Recent Results (from the past 240 hour(s))  Blood culture (routine x 2)     Status: None   Collection Time: 09/01/15  9:45 PM  Result Value Ref Range Status   Specimen Description BLOOD RIGHT ANTECUBITAL  Final   Special Requests BOTTLES DRAWN AEROBIC ONLY 5CC  Final   Culture NO GROWTH 5 DAYS  Final   Report Status 09/06/2015 FINAL  Final  Blood culture (routine x 2)     Status: None   Collection Time: 09/01/15  9:50 PM  Result  Value Ref Range Status   Specimen Description BLOOD RIGHT ARM  Final   Special Requests   Final    BOTTLES DRAWN AEROBIC AND ANAEROBIC 6CC BLUE 5CC PURPLE   Culture NO GROWTH 5 DAYS  Final   Report Status 09/06/2015 FINAL  Final     Studies: Dg Chest 2 View  09/06/2015  CLINICAL DATA:  Patient was discharged on 09/05/2015 with fluid. Now feels worse. Chest pain. EXAM: CHEST  2 VIEW COMPARISON:  09/01/2015 FINDINGS: Shallow inspiration. Cardiac enlargement. Developing patchy airspace infiltrates in both mid lung suggesting developing multifocal pneumonia. No blunting of costophrenic angles. No pneumothorax. IMPRESSION: Developing nodular airspace infiltrates in both mid lungs suggesting progressing multifocal pneumonia. Electronically Signed   By: Burman NievesWilliam  Stevens M.D.   On: 09/06/2015 23:21    Scheduled Meds: . dextromethorphan-guaiFENesin  1 tablet Oral BID  . enoxaparin (LOVENOX) injection  80 mg Subcutaneous Q24H  . insulin aspart  0-5 Units Subcutaneous QHS  . insulin aspart  0-9 Units Subcutaneous TID WC  . ipratropium  0.5 mg Nebulization QID  . levalbuterol  1.25 mg Nebulization QID  . methylPREDNISolone (SOLU-MEDROL) injection  60 mg Intravenous Q12H  . piperacillin-tazobactam (ZOSYN)  IV  3.375 g Intravenous 3 times per day  . vancomycin  1,500 mg Intravenous Q8H   Continuous Infusions:   Time spent:  35 minutes   Penny PiaVEGA, Yvette Rivenbark  [facility] Pager [phone] If 7PM-7AM, please contact night-coverage at [email or web address], password [email or web address] 09/08/2015, 1:34 PM  LOS: 1 day

## 2015-09-08 NOTE — Progress Notes (Signed)
Pharmacy Antibiotic Note  Yvette Gibbs is a 25 y.o. female admitted on 09/06/2015 with pneumonia. Pt was recently admitted and treated for influenza (09/01/15 to 09/05/15), discharged on home O2 for persistent hypoxia. Pharmacy has been consulted for Vancomycin and Zosyn dosing. --WBC= 8.1, PCT < 0.1, LA 0.7 -Spoke with Dr. Cena BentonVega and ok to change to levaquin   Plan: -levaquin 750mg  po q24h (consider 7 days total for an end date of 3/31) -Will sign off. Please contact pharmacy with any other needs.     Temp (24hrs), Avg:97.9 F (36.6 C), Min:97.5 F (36.4 C), Max:98.2 F (36.8 C)   Recent Labs Lab 09/01/15 1725 09/01/15 2155 09/02/15 0050 09/02/15 0557 09/03/15 0303 09/07/15 0115 09/07/15 0145 09/07/15 0513  WBC 5.6  --   --  5.0 4.9 8.1  --   --   CREATININE 0.67  --   --  0.64 0.72 0.62  --   --   LATICACIDVEN  --  0.8 1.9  --   --   --  0.7 0.7    Estimated Creatinine Clearance: 162.6 mL/min (by C-G formula based on Cr of 0.62).    Allergies  Allergen Reactions  . Fish Allergy Itching and Rash    Antimicrobials this admission: 3/25 Vanc >> 3/26 3/25 Zosyn >> 3/26 3/26 LQ  Dose adjustments this admission: n/a  Microbiology results: 3/25 BCx: pending RSV: pending  Thank you for allowing pharmacy to be a part of this patient's care.  Harland GermanAndrew Kim Oki, Pharm D 09/08/2015 1:39 PM

## 2015-09-09 ENCOUNTER — Inpatient Hospital Stay (HOSPITAL_COMMUNITY): Payer: Self-pay

## 2015-09-09 DIAGNOSIS — J45901 Unspecified asthma with (acute) exacerbation: Secondary | ICD-10-CM

## 2015-09-09 DIAGNOSIS — J189 Pneumonia, unspecified organism: Secondary | ICD-10-CM

## 2015-09-09 LAB — GLUCOSE, CAPILLARY
Glucose-Capillary: 147 mg/dL — ABNORMAL HIGH (ref 65–99)
Glucose-Capillary: 185 mg/dL — ABNORMAL HIGH (ref 65–99)
Glucose-Capillary: 224 mg/dL — ABNORMAL HIGH (ref 65–99)
Glucose-Capillary: 194 mg/dL — ABNORMAL HIGH (ref 65–99)

## 2015-09-09 LAB — RESPIRATORY VIRUS PANEL
Adenovirus: NEGATIVE
Influenza A: NEGATIVE
Influenza B: NEGATIVE
Metapneumovirus: NEGATIVE
Parainfluenza 1: NEGATIVE
Parainfluenza 2: NEGATIVE
Parainfluenza 3: NEGATIVE
Respiratory Syncytial Virus A: NEGATIVE
Respiratory Syncytial Virus B: NEGATIVE
Rhinovirus: NEGATIVE

## 2015-09-09 MED ORDER — IPRATROPIUM BROMIDE 0.02 % IN SOLN
0.5000 mg | Freq: Two times a day (BID) | RESPIRATORY_TRACT | Status: DC
  Administered 2015-09-09 – 2015-09-10 (×2): 0.5 mg via RESPIRATORY_TRACT
  Filled 2015-09-09 (×2): qty 2.5

## 2015-09-09 MED ORDER — ALBUTEROL SULFATE (2.5 MG/3ML) 0.083% IN NEBU: 2.5000 mg | INHALATION_SOLUTION | RESPIRATORY_TRACT | Status: DC | PRN

## 2015-09-09 MED ORDER — LEVALBUTEROL HCL 1.25 MG/0.5ML IN NEBU
1.2500 mg | INHALATION_SOLUTION | Freq: Two times a day (BID) | RESPIRATORY_TRACT | Status: DC
  Administered 2015-09-09 – 2015-09-10 (×2): 1.25 mg via RESPIRATORY_TRACT
  Filled 2015-09-09 (×2): qty 0.5

## 2015-09-09 MED ORDER — PREDNISONE 20 MG PO TABS
40.0000 mg | ORAL_TABLET | Freq: Every day | ORAL | Status: DC
  Administered 2015-09-09 – 2015-09-10 (×2): 40 mg via ORAL
  Filled 2015-09-09 (×2): qty 2

## 2015-09-09 NOTE — Progress Notes (Addendum)
[facility] PROGRESS NOTE  Yvette Gibbs ZOX:[id number] DOB: 23-Apr-1991 DOA: 09/06/2015 PCP: Jacklynn BarnaclePLACEY,MARY H, NP  Assessment/Plan: Principal Problem:   HCAP (healthcare-associated pneumonia) - was on Vanc and Zosyn, Cx negative -changed to PO levaquin -improving, Rx for FLu last week, CXR improving - Strep urinary antigen negative, Respiratory virus panel pending -was unable to get meds and needs PCP-CM consult -on 3L home O2  Active Problems:   Diabetes mellitus type 2, uncontrolled (HCC) - Last hemoglobin A1c 6.1. Continue current regimen -Continue carb modified diet, resume metformin at DC    Morbid obesity (HCC)    Asthma exacerbation - Improving on current regimen, Abx as above, stop IV steroids and quick pred taper    OSA (obstructive sleep apnea) - continue supplemental oxygen -needs Sleep study  DVt proph: lovenox  Code Status: full Family Communication: No family at bedside Disposition Plan: home tomorrow   Consultants:  None  Procedures:  none  Antibiotics:  Vancomycin and Zosyn>>>Levaquin 3/26  HPI/Subjective: Breathing improving  Objective: Filed Vitals:   09/08/15 2238 09/09/15 0449  BP:  136/91  Pulse: 86 87  Temp:  98.2 F (36.8 C)  Resp: 18 18    Intake/Output Summary (Last 24 hours) at 09/09/15 1117 Last data filed at 09/08/15 2000  Gross per 24 hour  Intake    360 ml  Output   1153 ml  Net   -793 ml   There were no vitals filed for this visit.  Exam:   General:  Patient in no acute distress, alert and awake, morbidly obese  Cardiovascular: no cyanosis  Respiratory: no increased wob, no wheezes, diminished at bases  Abdomen: soft, obese, nt,  Musculoskeletal: no cyanosis or clubbing   Data Reviewed: Basic Metabolic Panel:  Recent Labs Lab 09/03/15 0303 09/07/15 0115  NA 136 138  K 3.6 4.1  CL 97* 97*  CO2 30 31  GLUCOSE 150* 101*  BUN <5* 7  CREATININE 0.72 0.62  CALCIUM 8.9 8.8*   Liver Function  Tests: No results for input(s): AST, ALT, ALKPHOS, BILITOT, PROT, ALBUMIN in the last 168 hours. No results for input(s): LIPASE, AMYLASE in the last 168 hours. No results for input(s): AMMONIA in the last 168 hours. CBC:  Recent Labs Lab 09/03/15 0303 09/07/15 0115  WBC 4.9 8.1  NEUTROABS  --  4.3  HGB 10.7* 11.0*  HCT 37.0 36.5  MCV 89.4 89.9  PLT 226 270   Cardiac Enzymes: No results for input(s): CKTOTAL, CKMB, CKMBINDEX, TROPONINI in the last 168 hours. BNP (last 3 results)  Recent Labs  09/01/15 2015  BNP 21.4    ProBNP (last 3 results) No results for input(s): PROBNP in the last 8760 hours.  CBG:  Recent Labs Lab 09/08/15 0557 09/08/15 1109 09/08/15 1623 09/08/15 2109 09/09/15 0607  GLUCAP 214* 177* 231* 178* 147*    Recent Results (from the past 240 hour(s))  Blood culture (routine x 2)     Status: None   Collection Time: 09/01/15  9:45 PM  Result Value Ref Range Status   Specimen Description BLOOD RIGHT ANTECUBITAL  Final   Special Requests BOTTLES DRAWN AEROBIC ONLY 5CC  Final   Culture NO GROWTH 5 DAYS  Final   Report Status 09/06/2015 FINAL  Final  Blood culture (routine x 2)     Status: None   Collection Time: 09/01/15  9:50 PM  Result Value Ref Range Status   Specimen Description BLOOD RIGHT ARM  Final   Special Requests  Final    BOTTLES DRAWN AEROBIC AND ANAEROBIC 6CC BLUE 5CC PURPLE   Culture NO GROWTH 5 DAYS  Final   Report Status 09/06/2015 FINAL  Final  Culture, blood (single)     Status: None (Preliminary result)   Collection Time: 09/07/15  1:15 AM  Result Value Ref Range Status   Specimen Description BLOOD RIGHT HAND  Final   Special Requests BOTTLES DRAWN AEROBIC AND ANAEROBIC  Final   Culture NO GROWTH 2 DAYS  Final   Report Status PENDING  Incomplete  Culture, blood (routine x 2) Call MD if unable to obtain prior to antibiotics being given     Status: None (Preliminary result)   Collection Time: 09/07/15  4:10 AM   Result Value Ref Range Status   Specimen Description BLOOD RIGHT ARM  Final   Special Requests BOTTLES DRAWN AEROBIC AND ANAEROBIC  Final   Culture NO GROWTH 2 DAYS  Final   Report Status PENDING  Incomplete  Culture, blood (routine x 2) Call MD if unable to obtain prior to antibiotics being given     Status: None (Preliminary result)   Collection Time: 09/07/15  4:15 AM  Result Value Ref Range Status   Specimen Description BLOOD LEFT ARM  Final   Special Requests IN PEDIATRIC BOTTLE  Final   Culture NO GROWTH 2 DAYS  Final   Report Status PENDING  Incomplete     Studies: Dg Chest 2 View  09/09/2015  CLINICAL DATA:  Hypoxia.  Shortness of breath with exertion. EXAM: CHEST - 2 VIEW COMPARISON:  Two-view chest x-ray 09/06/2015. FINDINGS: The heart size is exaggerated by low lung volumes. Previously-seen interstitial and airspace disease is improving. No significant focal consolidation is present. There are no effusions. he the visualized soft tissues and bony thorax are unremarkable. IMPRESSION: 1. Improving interstitial and airspace disease. 2. No significant airspace consolidation. Electronically Signed   By: Marin Roberts M.D.   On: 09/09/2015 08:29    Scheduled Meds: . dextromethorphan-guaiFENesin  1 tablet Oral BID  . enoxaparin (LOVENOX) injection  80 mg Subcutaneous Q24H  . insulin aspart  0-5 Units Subcutaneous QHS  . insulin aspart  0-9 Units Subcutaneous TID WC  . ipratropium  0.5 mg Nebulization QID  . levalbuterol  1.25 mg Nebulization QID  . levofloxacin  750 mg Oral Daily  . predniSONE  40 mg Oral Q breakfast   Continuous Infusions:   Time spent:  35 minutes   Yvette Gibbs  [facility] Pager [phone] If 7PM-7AM, please contact night-coverage at [email or web address], password [facility] 09/09/2015, 11:17 AM  LOS: 2 days

## 2015-09-09 NOTE — Progress Notes (Signed)
RT placed patient on CPAP HS. 2L bleed in needed. Patient tolerating well. RT will continue to monitor as needed.

## 2015-09-09 NOTE — Clinical Documentation Improvement (Signed)
Family Medicine  Please clarify respiratory status in progress notes and discharge summary   Chronic respiratory failure  Other  Clinically Undetermined  Document any associated diagnoses/conditions.   Supporting Information:  25 year old with chief complaint of Cough, chest tightness, and shortness of breath Admitted with HCAP and Asthma  RR 12-24 O2 Sats 93-100% via [place] 2-3L/m & CPAP at night   ED Provider & H&P note: Patient with a history of MR, depression, asthma, OSA, morbid obesity and DM presents with complaint of chest tightness and SOB today. She was recently admitted and treated for influenza (09/01/15 to 09/05/15), discharged on home O2 for persistent hypoxia. She was using her oxygen today as instructed but reports she did not fill her prescriptions due to financial constraints. She reports that she woke today feeling more SOB and that this worsened through the day. Pulmonary/Chest: Effort normal. She has wheezes.  Diminished breath sounds Final diagnoses 2. Oxygen dependent  Treatments CPAP  O2 therapy - Keep O2 sats greater than 92% Continuous pulse ox    Please exercise your independent, professional judgment when responding. A specific answer is not anticipated or expected.   Thank You,  Harless Littenebora T Vicci Reder Health Information Management [place] [phone]

## 2015-09-09 NOTE — Care Management Note (Addendum)
Case Management Note Donn PieriniKristi Damyah Gugel RN, BSN Unit 2W-Case Manager [phone]  Patient Details  Name: Yvette GentryChudasama Kneale MRN: [id number] Date of Birth: 08/16/1990  Subjective/Objective:   Pt admitted with HCAP, recently discharged from Hospital                 Action/Plan: PTA pt lived at home with grandmother- on last discharge pt was arranged with PCP with the [facility] Cell clinic- Patient has appointment at [facility] Cell Clinic on September 23, 2015 at 0930 am, patient has information - Pt was given a MATCH letter on last discharge to assist with medications- however does not show in system that pt used letter- per conversation with pt she states that she did not fill her medications because she did not have the $9 to fill them- [facility] is eligible for new MATCH letter for assistance on this discharge- have discussed with pt if she has anyone that can help her with the copay cost- she states that he mother is coming on Friday- and her boyfriend and his mother have offered to help get her meds also- will plan to give pt a new MATCH letter prior to discharge-will also give pt [facility]. [facility]- will need resumption order for discharge to continue services- [facility]- [facility] and [facility]-  CM to f/u with pt tomorrow for further assistance.   Expected Discharge Date:                  Expected Discharge Plan:   home/self care  In-House Referral:     Discharge planning Services     Post Acute Care Choice:    Choice offered to:     DME Arranged:    DME Agency:     HH Arranged:    HH Agency:     Status of Service:     Medicare Important Message Given:    Date Medicare IM Given:    Medicare IM give by:    Date Additional Medicare IM Given:    Additional Medicare Important Message give by:     If discussed at Long Length  of Stay Meetings, dates discussed:    Additional Comments:  Darrold SpanWebster, Jasara Corrigan Hall, RN 09/09/2015, 11:54 AM

## 2015-09-09 NOTE — Progress Notes (Signed)
[facility]  Patient Status: Active (receiving services up to time of hospitalization)  [facility] is providing the following services: RN  If patient discharges after hours, please call [phone]. She was readmitted before our RN could get out to see her.  Yvette BourgeoisMarie Gibbs 09/09/2015, 3:47 PM

## 2015-09-09 NOTE — Plan of Care (Addendum)
Problem: Food- and Nutrition-Related Knowledge Deficit (NB-1.1) Goal: Nutrition education Formal process to instruct or train a patient/client in a skill or to impart knowledge to help patients/clients voluntarily manage or modify food choices and eating behavior to maintain or improve health. Outcome: Completed/Met Date Met:  09/09/15  RD consulted for nutrition education regarding diabetes.     Lab Results  Component Value Date    HGBA1C 6.1* 09/02/2015    RD provided "Carbohydrate Counting for People with Diabetes" handout from the Academy of Nutrition and Dietetics. Discussed different food groups and their effects on blood sugar, emphasizing carbohydrate-containing foods. Provided list of carbohydrates and recommended serving sizes of common foods.  Discussed importance of controlled and consistent carbohydrate intake throughout the day. Provided examples of ways to balance meals/snacks and encouraged intake of high-fiber, whole grain complex carbohydrates. Teach back method used.  Expect fair compliance.  Body mass index is 63.82 kg/(m^2). Pt meets criteria for Obesity Class III based on current BMI.  Current diet order is Carbohydrate Modified, patient is consuming approximately 100% of meals at this time. Labs and medications reviewed. No further nutrition interventions warranted at this time. If additional nutrition issues arise, please re-consult RD.  Recommend outpatient referral to:  [facility] at [place]. [address] [place], [place] [address] [phone]  Arthur Holms, RD, LDN Pager #: [phone] After-Hours Pager #: [phone]

## 2015-09-10 DIAGNOSIS — E1165 Type 2 diabetes mellitus with hyperglycemia: Secondary | ICD-10-CM

## 2015-09-10 LAB — GLUCOSE, CAPILLARY
Glucose-Capillary: 135 mg/dL — ABNORMAL HIGH (ref 65–99)
Glucose-Capillary: 151 mg/dL — ABNORMAL HIGH (ref 65–99)

## 2015-09-10 LAB — LEGIONELLA PNEUMOPHILA SEROGP 1 UR AG: L. pneumophila Serogp 1 Ur Ag: NEGATIVE

## 2015-09-10 MED ORDER — PREDNISONE 20 MG PO TABS: ORAL_TABLET | ORAL | Status: DC

## 2015-09-10 MED ORDER — LEVOFLOXACIN 750 MG PO TABS: 750.0000 mg | ORAL_TABLET | Freq: Every day | ORAL | Status: DC

## 2015-09-10 MED ORDER — HYDROCOD POLST-CPM POLST ER 10-8 MG/5ML PO SUER
5.0000 mL | Freq: Once | ORAL | Status: AC
  Administered 2015-09-10: 5 mL via ORAL
  Filled 2015-09-10: qty 5

## 2015-09-10 MED ORDER — LEVALBUTEROL HCL 1.25 MG/0.5ML IN NEBU: 1.2500 mg | INHALATION_SOLUTION | Freq: Three times a day (TID) | RESPIRATORY_TRACT | Status: DC

## 2015-09-10 MED ORDER — IPRATROPIUM BROMIDE 0.02 % IN SOLN: 0.5000 mg | Freq: Three times a day (TID) | RESPIRATORY_TRACT | Status: DC

## 2015-09-10 NOTE — Care Management Note (Signed)
Case Management Note Yvette PieriniKristi Julyssa Kyer RN, BSN Unit 2W-Case Manager [phone]  Patient Details  Name: Yvette Gibbs MRN: [id number] Date of Birth: Nov 05, 1990  Subjective/Objective:   Pt admitted with HCAP, recently discharged from Hospital                 Action/Plan: PTA pt lived at home with grandmother- on last discharge pt was arranged with PCP with the [facility] Cell clinic- Patient has appointment at [facility] Clinic on September 23, 2015 at 0930 am, patient has information - Pt was given a MATCH letter on last discharge to assist with medications- however does not show in system that pt used letter- per conversation with pt she states that she did not fill her medications because she did not have the $9 to fill them- [facility] is eligible for new MATCH letter for assistance on this discharge- have discussed with pt if she has anyone that can help her with the copay cost- she states that he mother is coming on Friday- and her boyfriend and his mother have offered to help get her meds also- will plan to give pt a new MATCH letter prior to discharge-will also give pt info [facility]eters [facility] that are inexpensive. [facility]- will need resumption order for discharge to continue services- [facility]- [facility] and [facility] pt [facility] with [facility]-  CM to f/u with pt tomorrow for further assistance.   Expected Discharge Date:   09/10/15               Expected Discharge Plan:  Home w Home Health Services  In-House Referral:     Discharge planning Services  CM Consult, MATCH Program, Medication Assistance, Indigent Health Clinic  Post Acute Care Choice:  Home Health, Resumption of Svcs/PTA Provider Choice offered to:  Patient  DME Arranged:    DME Agency:     HH Arranged:  RN HH Agency:  [facility]  Status of Service:  Completed, signed off  Medicare Important Message  Given:    Date Medicare IM Given:    Medicare IM give by:    Date Additional Medicare IM Given:    Additional Medicare Important Message give by:     If discussed at Long Length of Stay Meetings, dates discussed:    Discharge Disposition: home/home health  Additional Comments:  09/10/15- 1100- Yvette PieriniKristi Cleave Ternes RN, BSN- pt for d/c home today- have given pt updated MATCH letter along with new prescriptions (2) pt plans on using [facility]- states that he boyfriend and his mom will help her get her meds this time- info also given to pt on [employer] brand glucometers and strips- [facility] notified [facility] of pt's discharge and HH resumption- [facility] to [facility] this afternoon. No further CM needs noted  Yvette Gibbs, Yvette SinkKristi Hall, RN 09/10/2015, 11:34 AM

## 2015-09-11 NOTE — Progress Notes (Signed)
Discharge Note:  Patient alert and oriented X 4 and in no distress.  Patient provided with discharge instructions regarding signs and symptoms to report, medications, diet, activity, and upcoming appointments.  Patient verbalized understanding of all instructions. Telemetry discontinued. No peripheral IV present.  Patient transported out via wheelchair by NT.

## 2015-09-12 LAB — CULTURE, BLOOD (SINGLE): Culture: NO GROWTH

## 2015-09-12 LAB — CULTURE, BLOOD (ROUTINE X 2)
Culture: NO GROWTH
Culture: NO GROWTH

## 2015-09-23 ENCOUNTER — Ambulatory Visit: Payer: Medicaid Other | Admitting: Family Medicine

## 2015-09-24 NOTE — Discharge Summary (Signed)
Physician Discharge Summary  Orli Fjelstad ZOX:[id number] DOB: [birth year] DOA: 09/06/2015  PCP: Jacklynn Barnacle, NP  Admit date: 09/06/2015 Discharge date: 09/10/2015  Time spent:  Recommendations for Outpatient Follow-up:  1. [facility] cell center 4/10 for PCP 2. Needs to lose weight/lifestyle modification and Sleep study   Discharge Diagnoses:  Principal Problem:   HCAP (healthcare-associated pneumonia) Active Problems:   Diabetes mellitus type 2, uncontrolled (HCC)   Morbid obesity (HCC)   Asthma exacerbation   OSA (obstructive sleep apnea)   Sepsis (HCC)   Asthma   Discharge Condition: stable  Diet recommendation: DM/diabetic  Filed Weights   09/09/15 1522  Weight: 158.305 kg (349 lb)    History of present illness:  Chief Complaint: productive cough and shortness of breath HPI: Yvette Gibbs is a 25 y.o. female with PMH of Mental retardation, depression, asthma, OSA, morbid obesity and DM, who presents with productive cough and shortness of breath. Pt was recently admitted and treated for influenza (09/01/15 to 09/05/15), discharged on home O2 for persistent hypoxia. She was using her oxygen as instructed but reported that she did not fill her prescriptions due to financial constraints  Hospital Course:  HCAP (healthcare-associated pneumonia) - was treated with Vanc and Zosyn, Cx negative -changed to PO levaquin yesterday  -improving, Rx for FLu last week, CXR improving - Strep urinary antigen negative, Respiratory virus panel pending -was unable to get meds after last discahrge and needs PCP-CM consulted and assisted with meds and PCP -FU made -continue 3L home O2   Diabetes mellitus type 2, - Last hemoglobin A1c 6.1 -Continue carb modified diet, resumed metformin at DC   Morbid obesity (HCC)  -life style modification advised   Asthma exacerbation - Improving on current regimen, Abx as above, stopped IV steroids and quick pred taper  prescribed   OSA (obstructive sleep apnea) - continue supplemental oxygen -needs Sleep study  Discharge Exam: Filed Vitals:   09/09/15 2122 09/10/15 0300  BP: 139/85 143/86  Pulse: 77 71  Temp: 98 F (36.7 C) 98.3 F (36.8 C)  Resp: 18 18    General: AAOX3, morbidly obese Cardiovascular: S1S2/RRR Respiratory: improved air movement  Discharge Instructions   Discharge Instructions    Diet Carb Modified    Complete by:  As directed      Increase activity slowly    Complete by:  As directed           Discharge Medication List as of 09/10/2015 12:15 PM    START taking these medications   Details  levofloxacin (LEVAQUIN) 750 MG tablet Take 1 tablet (750 mg total) by mouth daily. For 3days, Starting 09/10/2015, Until Discontinued, Print      CONTINUE these medications which have CHANGED   Details  predniSONE (DELTASONE) 20 MG tablet Take  for 1day then  for 2days then STOP, Print      CONTINUE these medications which have NOT CHANGED   Details  albuterol (PROVENTIL HFA;VENTOLIN HFA) 108 (90 BASE) MCG/ACT inhaler Inhale 2 puffs into the lungs every 6 (six) hours as needed for wheezing or shortness of breath., Until Discontinued, Historical Med    ibuprofen (ADVIL,MOTRIN) 200 MG tablet Take 400-600 mg by mouth every 6 (six) hours as needed (pain)., Until Discontinued, Historical Med    metFORMIN (GLUCOPHAGE) 500 MG tablet Take 1 tablet (500 mg total) by mouth 2 (two) times daily with a meal., Starting 09/05/2015, Until Discontinued, Print    polyethylene glycol (MIRALAX / GLYCOLAX) packet Take 17 g by  mouth daily as needed for mild constipation or moderate constipation., Starting 09/06/2014, Until Discontinued, No Print      STOP taking these medications     oseltamivir (TAMIFLU) 75 MG capsule        Allergies  Allergen Reactions  . Fish Allergy Itching and Rash   Follow-up Information    Follow up with [facility] On 09/23/2015.    Specialty:  Internal Medicine   Why:  appointment for 9:30    Contact information:   [address] [place] [id number] [phone]      Follow up with [facility].   Why:  HH-RN resumed    Contact information:   [address] [place] [id number] [phone]        The results of significant diagnostics from this hospitalization (including imaging, microbiology, ancillary and laboratory) are listed below for reference.    Significant Diagnostic Studies: Dg Chest 2 View  09/09/2015  CLINICAL DATA:  Hypoxia.  Shortness of breath with exertion. EXAM: CHEST - 2 VIEW COMPARISON:  Two-view chest x-ray 09/06/2015. FINDINGS: The heart size is exaggerated by low lung volumes. Previously-seen interstitial and airspace disease is improving. No significant focal consolidation is present. There are no effusions. he the visualized soft tissues and bony thorax are unremarkable. IMPRESSION: 1. Improving interstitial and airspace disease. 2. No significant airspace consolidation. Electronically Signed   By: Marin Robertshristopher  Mattern M.D.   On: 09/09/2015 08:29   Dg Chest 2 View  09/06/2015  CLINICAL DATA:  Patient was discharged on 09/05/2015 with fluid. Now feels worse. Chest pain. EXAM: CHEST  2 VIEW COMPARISON:  09/01/2015 FINDINGS: Shallow inspiration. Cardiac enlargement. Developing patchy airspace infiltrates in both mid lung suggesting developing multifocal pneumonia. No blunting of costophrenic angles. No pneumothorax. IMPRESSION: Developing nodular airspace infiltrates in both mid lungs suggesting progressing multifocal pneumonia. Electronically Signed   By: Burman NievesWilliam  Stevens M.D.   On: 09/06/2015 23:21   Dg Chest 2 View  09/01/2015  CLINICAL DATA:  Stabbing Centralized chest pain since 3am, hx of asthma EXAM: CHEST  2 VIEW COMPARISON:  None. FINDINGS: Heart size is upper limits normal. There is pulmonary vascular congestion but no overt edema. No focal  consolidations or pleural effusions are identified. Study quality is degraded by patient body habitus. IMPRESSION: Cardiomegaly and pulmonary vascular congestion. Electronically Signed   By: Norva PavlovElizabeth  Brown M.D.   On: 09/01/2015 17:35    Microbiology: No results found for this or any previous visit (from the past 240 hour(s)).   Labs: Basic Metabolic Panel: No results for input(s): NA, K, CL, CO2, GLUCOSE, BUN, CREATININE, CALCIUM, MG, PHOS in the last 168 hours. Liver Function Tests: No results for input(s): AST, ALT, ALKPHOS, BILITOT, PROT, ALBUMIN in the last 168 hours. No results for input(s): LIPASE, AMYLASE in the last 168 hours. No results for input(s): AMMONIA in the last 168 hours. CBC: No results for input(s): WBC, NEUTROABS, HGB, HCT, MCV, PLT in the last 168 hours. Cardiac Enzymes: No results for input(s): CKTOTAL, CKMB, CKMBINDEX, TROPONINI in the last 168 hours. BNP: BNP (last 3 results)  Recent Labs  09/01/15 2015  BNP 21.4    ProBNP (last 3 results) No results for input(s): PROBNP in the last 8760 hours.  CBG: No results for input(s): GLUCAP in the last 168 hours.     SignedZannie Cove:  Charina Fons MD.  [facility] 09/24/2015, 3:31 PM

## 2015-10-08 ENCOUNTER — Ambulatory Visit: Payer: Medicaid Other | Admitting: Family Medicine

## 2015-11-06 ENCOUNTER — Encounter (HOSPITAL_COMMUNITY): Payer: Self-pay

## 2015-11-06 ENCOUNTER — Emergency Department (HOSPITAL_COMMUNITY): Payer: Self-pay

## 2015-11-06 ENCOUNTER — Emergency Department (HOSPITAL_COMMUNITY)
Admission: EM | Admit: 2015-11-06 | Discharge: 2015-11-06 | Disposition: A | Discharge status: Home / Self Care | Payer: Self-pay | Attending: Emergency Medicine | Admitting: Emergency Medicine

## 2015-11-06 DIAGNOSIS — E119 Type 2 diabetes mellitus without complications: Secondary | ICD-10-CM | POA: Insufficient documentation

## 2015-11-06 DIAGNOSIS — J189 Pneumonia, unspecified organism: Secondary | ICD-10-CM | POA: Insufficient documentation

## 2015-11-06 DIAGNOSIS — Z7984 Long term (current) use of oral hypoglycemic drugs: Secondary | ICD-10-CM | POA: Insufficient documentation

## 2015-11-06 DIAGNOSIS — J45909 Unspecified asthma, uncomplicated: Secondary | ICD-10-CM | POA: Insufficient documentation

## 2015-11-06 DIAGNOSIS — Z7952 Long term (current) use of systemic steroids: Secondary | ICD-10-CM | POA: Insufficient documentation

## 2015-11-06 LAB — URINALYSIS, ROUTINE W REFLEX MICROSCOPIC
Bilirubin Urine: NEGATIVE
Glucose, UA: NEGATIVE mg/dL
Hgb urine dipstick: NEGATIVE
Ketones, ur: NEGATIVE mg/dL
Leukocytes, UA: NEGATIVE
Nitrite: NEGATIVE
Protein, ur: 100 mg/dL — AB
Specific Gravity, Urine: 1.022 (ref 1.005–1.030)
pH: 5.5 (ref 5.0–8.0)

## 2015-11-06 LAB — URINE MICROSCOPIC-ADD ON

## 2015-11-06 LAB — PREGNANCY, URINE: Preg Test, Ur: NEGATIVE

## 2015-11-06 MED ORDER — CEFTRIAXONE SODIUM 1 G IJ SOLR
1.0000 g | Freq: Once | INTRAMUSCULAR | Status: AC
  Administered 2015-11-06: 1 g via INTRAMUSCULAR
  Filled 2015-11-06: qty 10

## 2015-11-06 MED ORDER — NORGESTIM-ETH ESTRAD TRIPHASIC 0.18/0.215/0.25 MG-35 MCG PO TABS: 1.0000 | ORAL_TABLET | Freq: Every day | ORAL | Status: DC

## 2015-11-06 MED ORDER — LEVOFLOXACIN 750 MG PO TABS
750.0000 mg | ORAL_TABLET | Freq: Once | ORAL | Status: AC
  Administered 2015-11-06: 750 mg via ORAL
  Filled 2015-11-06: qty 1

## 2015-11-06 MED ORDER — METFORMIN HCL 500 MG PO TABS: 500.0000 mg | ORAL_TABLET | Freq: Two times a day (BID) | ORAL | Status: DC

## 2015-11-06 MED ORDER — ACETAMINOPHEN 500 MG PO TABS
1000.0000 mg | ORAL_TABLET | Freq: Once | ORAL | Status: AC
  Administered 2015-11-06: 1000 mg via ORAL
  Filled 2015-11-06: qty 2

## 2015-11-06 MED ORDER — LEVOFLOXACIN 750 MG PO TABS: 750.0000 mg | ORAL_TABLET | Freq: Every day | ORAL | Status: DC

## 2015-11-06 MED ORDER — LIDOCAINE HCL 1 % IJ SOLN
INTRAMUSCULAR | Status: AC
  Administered 2015-11-06: 20 mL
  Filled 2015-11-06: qty 20

## 2015-11-06 NOTE — ED Notes (Signed)
Per EMS- Patient  C/ocough, chills, weakness x 5 days. Patient also c/o being late with her period x 14 days.

## 2015-11-06 NOTE — Progress Notes (Signed)
Updated ED PA/NP

## 2015-11-06 NOTE — Discharge Instructions (Signed)
°  Take your antibiotics as directed and to completion. You should never have any leftover antibiotics! Push fluids and stay well hydrated.   Any antibiotic use can reduce the efficacy of hormonal birth control. Please use back up method of contraception.   Please follow with your primary care doctor in the next 2 days for a check-up. They must obtain records for further management.   Do not hesitate to return to the Emergency Department for any new, worsening or concerning symptoms.    Community-Acquired Pneumonia, Adult Pneumonia is an infection of the lungs. One type of pneumonia can happen while a person is in a hospital. A different type can happen when a person is not in a hospital (community-acquired pneumonia). It is easy for this kind to spread from person to person. It can spread to you if you breathe near an infected person who coughs or sneezes. Some symptoms include:  A dry cough.  A wet (productive) cough.  Fever.  Sweating.  Chest pain. HOME CARE  Take over-the-counter and prescription medicines only as told by your doctor.  Only take cough medicine if you are losing sleep.  If you were prescribed an antibiotic medicine, take it as told by your doctor. Do not stop taking the antibiotic even if you start to feel better.  Sleep with your head and neck raised (elevated). You can do this by putting a few pillows under your head, or you can sleep in a recliner.  Do not use tobacco products. These include cigarettes, chewing tobacco, and e-cigarettes. If you need help quitting, ask your doctor.  Drink enough water to keep your pee (urine) clear or pale yellow. A shot (vaccine) can help prevent pneumonia. Shots are often suggested for:  People older than 23 years of age.  People older than 23 years of age:  Who are having cancer treatment.  Who have long-term (chronic) lung disease.  Who have problems with their body's defense system (immune system). You may also  prevent pneumonia if you take these actions:  Get the flu (influenza) shot every year.  Go to the dentist as often as told.  Wash your hands often. If soap and water are not available, use hand sanitizer. GET HELP IF:  You have a fever.  You lose sleep because your cough medicine does not help. GET HELP RIGHT AWAY IF:  You are short of breath and it gets worse.  You have more chest pain.  Your sickness gets worse. This is very serious if:  You are an older adult.  Your body's defense system is weak.  You cough up blood.   This information is not intended to replace advice given to you by your health care provider. Make sure you discuss any questions you have with your health care provider.   Document Released: 11/18/2007 Document Revised: 02/20/2015 Document Reviewed: 09/26/2014 Elsevier Interactive Patient Education [employer]2016 Elsevier Inc.

## 2015-11-06 NOTE — ED Provider Notes (Signed)
CSN: [id number]     Arrival date & time 11/06/15  1409 History   First MD Initiated Contact with Patient 11/06/15 1514     Chief Complaint  Patient presents with  . Cough  . Chills  . Weakness     (Consider location/radiation/quality/duration/timing/severity/associated sxs/prior Treatment) HPI   Blood pressure 144/98, pulse 98, temperature 98.3 F (36.8 C), temperature source Oral, resp. rate 20, last menstrual period 10/03/2015, SpO2 94 %.  Yvette Gibbs is a 25 y.o. female with past medical history significant for uncontrolled type 2 diabetes, morbid obesity, asthma, obstructive sleep apnea complaining of productive cough, chills, generalized weakness with severe pleuritic anterior chest pain onset 5 days ago with associated tactile fever and chills. Patient also reports that she is late on her period 2 weeks, she had unprotected sex last month. Has never been pregnant before. Patient rates her pain at 20 out of 10, no pain medication taken prior to arrival. Has been noncompliant with diabetes medications because she states no one ever gave her any, states she does not have a primary care physician. Denies dysuria, hematuria, abnormal vaginal discharge, abdominal pain.  Past Medical History  Diagnosis Date  . Mental disorder   . Depression   . Asthma   . OSA (obstructive sleep apnea)   . Extreme obesity (HCC)   . Diabetes mellitus type 2, uncontrolled (HCC)   . Family history of adverse reaction to anesthesia     " my grandmother had an allergic reaction   Past Surgical History  Procedure Laterality Date  . Appendectomy    . Tonsillectomy    . Cholecystectomy N/A 09/05/2014    Procedure: LAPAROSCOPIC CHOLECYSTECTOMY;  Surgeon: Axel Filler, MD;  Location: [facility] OR;  Service: General;  Laterality: N/A;   Family History  Problem Relation Age of Onset  . Diabetes Mellitus II Maternal Grandmother    Social History  Substance Use Topics  . Smoking status: Never Smoker   .  Smokeless tobacco: Never Used  . Alcohol Use: No   OB History    No data available     Review of Systems  10 systems reviewed and found to be negative, except as noted in the HPI.   Allergies  Fish allergy  Home Medications   Prior to Admission medications   Medication Sig Start Date End Date Taking? Authorizing Provider  albuterol (PROVENTIL HFA;VENTOLIN HFA) 108 (90 BASE) MCG/ACT inhaler Inhale 2 puffs into the lungs every 6 (six) hours as needed for wheezing or shortness of breath.   Yes Historical Provider, MD  levofloxacin (LEVAQUIN) 750 MG tablet Take 1 tablet (750 mg total) by mouth daily. X 7 days 11/06/15   Wynetta Emery, PA-C  metFORMIN (GLUCOPHAGE) 500 MG tablet Take 1 tablet (500 mg total) by mouth 2 (two) times daily with a meal. 11/06/15   Dominyck Reser, PA-C  Norgestimate-Ethinyl Estradiol Triphasic (ORTHO TRI-CYCLEN, 28,) 0.18/0.215/0.25 MG-35 MCG tablet Take 1 tablet by mouth daily. 11/06/15   Fritzie Prioleau, PA-C  polyethylene glycol (MIRALAX / GLYCOLAX) packet Take 17 g by mouth daily as needed for mild constipation or moderate constipation. Patient not taking: Reported on 09/02/2015 09/06/14   Nonie Hoyer, PA-C  predniSONE (DELTASONE) 20 MG tablet Take  for 1day then  for 2days then STOP Patient not taking: Reported on 11/06/2015 09/10/15   Zannie Cove, MD   BP 147/77 mmHg  Pulse 87  Temp(Src) 98.1 F (36.7 C) (Oral)  Resp 14  SpO2 94%  LMP 10/03/2015 Physical  Exam  Constitutional: She is oriented to person, place, and time. She appears well-developed and well-nourished. No distress.  Morbidly obese  HENT:  Head: Normocephalic and atraumatic.  Mouth/Throat: Oropharynx is clear and moist.  Eyes: Conjunctivae and EOM are normal. Pupils are equal, round, and reactive to light.  Cardiovascular: Normal rate, regular rhythm and intact distal pulses.   Pulmonary/Chest: Effort normal and breath sounds normal. No stridor. No respiratory distress. She  has no wheezes. She has no rales. She exhibits no tenderness.  Abdominal: Soft. Bowel sounds are normal. She exhibits no distension and no mass. There is no tenderness. There is no rebound and no guarding.  Musculoskeletal: Normal range of motion.  Neurological: She is alert and oriented to person, place, and time.  Psychiatric: She has a normal mood and affect.  Nursing note and vitals reviewed.   ED Course  Procedures (including critical care time) Labs Review Labs Reviewed  URINALYSIS, ROUTINE W REFLEX MICROSCOPIC (NOT AT [facility]) - Abnormal; Notable for the following:    APPearance CLOUDY (*)    Protein, ur 100 (*)    All other components within normal limits  URINE MICROSCOPIC-ADD ON - Abnormal; Notable for the following:    Squamous Epithelial / LPF 6-30 (*)    Bacteria, UA RARE (*)    All other components within normal limits  PREGNANCY, URINE  POC URINE PREG, ED    Imaging Review Dg Chest 2 View  11/06/2015  CLINICAL DATA:  Productive cough. Nausea. Chills. Weakness. Duration of symptoms 5 days. EXAM: CHEST  2 VIEW COMPARISON:  09/09/2015 FINDINGS: Heart size is normal. Mediastinal shadows are normal. There is central bronchial thickening. There may be hazy perihilar infiltrate as might be seen with viral pneumonia. No dense consolidation or lobar collapse. No effusions. No bony abnormalities. IMPRESSION: Bronchial thickening. Question hazy perihilar pneumonia. No dense consolidation or lobar collapse. Electronically Signed   By: Paulina FusiMark  Shogry M.D.   On: 11/06/2015 17:40   I have personally reviewed and evaluated these images and lab results as part of my medical decision-making.   EKG Interpretation None      MDM   Final diagnoses:  Pneumonia, unspecified laterality, unspecified part of lung    Filed Vitals:   11/06/15 1424 11/06/15 1622  BP: 144/98 147/77  Pulse: 98 87  Temp: 98.3 F (36.8 C) 98.1 F (36.7 C)  TempSrc: Oral Oral  Resp: 20 14  SpO2: 94% 94%     Medications  cefTRIAXone (ROCEPHIN) injection 1 g (not administered)  levofloxacin (LEVAQUIN) tablet 750 mg (not administered)  acetaminophen (TYLENOL) tablet 1,000 mg (1,000 mg Oral Given 11/06/15 1622)    Yvette Gibbs is 25 y.o. female presenting with Late., Cough, chills and weakness onset 5 days ago. Lung sounds clear to auscultation, she saturating well on room air, afebrile with normal vital signs, overall very well appearing. Pregnancy test is negative, chest x-ray negative with possible abnormality in the perihilar region. Will give Rocephin and Levaquin. Patient states she will take birth control medication, consulted case management to set up appointment for her primary care, stressed to her the importance of taking her metformin, patient states she will take her medications as prescribed.  Discussed case with attending physician who agrees with care plan and disposition.   Evaluation does not show pathology that would require ongoing emergent intervention or inpatient treatment. Pt is hemodynamically stable and mentating appropriately. Discussed findings and plan with patient/guardian, who agrees with care plan. All questions answered. Return  precautions discussed and outpatient follow up given.   New Prescriptions   LEVOFLOXACIN (LEVAQUIN) 750 MG TABLET    Take 1 tablet (750 mg total) by mouth daily. X 7 days   METFORMIN (GLUCOPHAGE) 500 MG TABLET    Take 1 tablet (500 mg total) by mouth 2 (two) times daily with a meal.   NORGESTIMATE-ETHINYL ESTRADIOL TRIPHASIC (ORTHO TRI-CYCLEN, 28,) 0.18/0.215/0.25 MG-35 MCG TABLET    Take 1 tablet by mouth daily.         Wynetta Emery, PA-C 11/06/15 1808  Azalia Bilis, MD 11/06/15 [phone]

## 2015-11-06 NOTE — Progress Notes (Signed)
Pt states she has been here seven years  [place]  Pt confirmed her address and telephone number as correct Pt confirms she is still seeing [employer] as listed in EPIC as her pcp  Familiar with [facility] Pt states she is in process of completing orange card Remaining tax forms Pt with 2 admissions and 1 ED visit  Pt was [facility] an [facility]  On 11/19/15 at 1445 by [facility] Pt made aware Pt informed her grandmother   Pt interested in knowing if she is pregnant Cm will refer pt to ED PA  Pt on her cell speaking with her grandmother Pt states this is who she is staying with

## 2015-11-06 NOTE — Progress Notes (Signed)
ED CM review Cm consult and a few EPIC notes for background information Spoke with [name] PA  This [facility]

## 2015-11-06 NOTE — Progress Notes (Signed)
ED CM received consult from [facility] regarding patient needing follow up. [facility] Tuesday 6/6 at 2:45pm. Clinic contact information placed on AVS.

## 2015-11-19 ENCOUNTER — Inpatient Hospital Stay: Payer: Self-pay | Admitting: Family Medicine

## 2015-12-06 ENCOUNTER — Encounter (HOSPITAL_COMMUNITY): Payer: Self-pay | Admitting: Neurology

## 2015-12-06 ENCOUNTER — Other Ambulatory Visit: Payer: Self-pay

## 2015-12-06 ENCOUNTER — Emergency Department (HOSPITAL_COMMUNITY): Payer: Self-pay

## 2015-12-06 ENCOUNTER — Emergency Department (HOSPITAL_COMMUNITY)
Admission: EM | Admit: 2015-12-06 | Discharge: 2015-12-06 | Disposition: A | Discharge status: Home / Self Care | Payer: Self-pay | Attending: Emergency Medicine | Admitting: Emergency Medicine

## 2015-12-06 DIAGNOSIS — Z79899 Other long term (current) drug therapy: Secondary | ICD-10-CM | POA: Insufficient documentation

## 2015-12-06 DIAGNOSIS — Z7984 Long term (current) use of oral hypoglycemic drugs: Secondary | ICD-10-CM | POA: Insufficient documentation

## 2015-12-06 DIAGNOSIS — J45909 Unspecified asthma, uncomplicated: Secondary | ICD-10-CM | POA: Insufficient documentation

## 2015-12-06 DIAGNOSIS — E119 Type 2 diabetes mellitus without complications: Secondary | ICD-10-CM | POA: Insufficient documentation

## 2015-12-06 DIAGNOSIS — J189 Pneumonia, unspecified organism: Secondary | ICD-10-CM | POA: Insufficient documentation

## 2015-12-06 LAB — CBC WITH DIFFERENTIAL/PLATELET
Basophils Absolute: 0 10*3/uL (ref 0.0–0.1)
Basophils Relative: 0 %
Eosinophils Absolute: 0.1 10*3/uL (ref 0.0–0.7)
Eosinophils Relative: 1 %
HCT: 35.3 % — ABNORMAL LOW (ref 36.0–46.0)
Hemoglobin: 10.8 g/dL — ABNORMAL LOW (ref 12.0–15.0)
Lymphocytes Relative: 18 %
Lymphs Abs: 1.3 10*3/uL (ref 0.7–4.0)
MCH: 25.5 pg — ABNORMAL LOW (ref 26.0–34.0)
MCHC: 30.6 g/dL (ref 30.0–36.0)
MCV: 83.3 fL (ref 78.0–100.0)
Monocytes Absolute: 0.5 10*3/uL (ref 0.1–1.0)
Monocytes Relative: 7 %
Neutro Abs: 5.6 10*3/uL (ref 1.7–7.7)
Neutrophils Relative %: 74 %
Platelets: 254 10*3/uL (ref 150–400)
RBC: 4.24 MIL/uL (ref 3.87–5.11)
RDW: 14.6 % (ref 11.5–15.5)
WBC: 7.4 10*3/uL (ref 4.0–10.5)

## 2015-12-06 LAB — I-STAT TROPONIN, ED: Troponin i, poc: 0 ng/mL (ref 0.00–0.08)

## 2015-12-06 LAB — I-STAT BETA HCG BLOOD, ED (MC, WL, AP ONLY): I-stat hCG, quantitative: <5 m[IU]/mL (ref <5)

## 2015-12-06 LAB — BASIC METABOLIC PANEL
Anion gap: 7 (ref 5–15)
BUN: 7 mg/dL (ref 6–20)
CO2: 29 mmol/L (ref 22–32)
Calcium: 9.3 mg/dL (ref 8.9–10.3)
Chloride: 101 mmol/L (ref 101–111)
Creatinine, Ser: 0.59 mg/dL (ref 0.44–1.00)
GFR calc Af Amer: >60 mL/min (ref >60)
GFR calc non Af Amer: >60 mL/min (ref >60)
Glucose, Bld: 109 mg/dL — ABNORMAL HIGH (ref 65–99)
Potassium: 3.8 mmol/L (ref 3.5–5.1)
Sodium: 137 mmol/L (ref 135–145)

## 2015-12-06 LAB — BRAIN NATRIURETIC PEPTIDE: B Natriuretic Peptide: 14.6 pg/mL (ref 0.0–100.0)

## 2015-12-06 MED ORDER — SODIUM CHLORIDE 0.9 % IV BOLUS (SEPSIS)
1000.0000 mL | Freq: Once | INTRAVENOUS | Status: AC
  Administered 2015-12-06: 1000 mL via INTRAVENOUS

## 2015-12-06 MED ORDER — ACETAMINOPHEN 325 MG PO TABS
650.0000 mg | ORAL_TABLET | Freq: Once | ORAL | Status: AC
Start: 2015-12-06 — End: 2015-12-06
  Administered 2015-12-06: 650 mg via ORAL
  Filled 2015-12-06: qty 2

## 2015-12-06 MED ORDER — LEVOFLOXACIN 750 MG PO TABS: 750.0000 mg | ORAL_TABLET | Freq: Every day | ORAL | Status: DC

## 2015-12-06 NOTE — Discharge Instructions (Signed)
It is very important that you follow up with the pulmonologist listed. Call the first thing Monday morning to schedule your follow-up appointment. Please take all of your antibiotics until finished!  Return to ER for new or worsening symptoms, any additional concerns.

## 2015-12-06 NOTE — ED Notes (Signed)
Patient transported to X-ray 

## 2015-12-06 NOTE — ED Provider Notes (Signed)
CSN: [id number]     Arrival date & time 12/06/15  1247 History   First MD Initiated Contact with Patient 12/06/15 1255     Chief Complaint  Patient presents with  . Shortness of Breath    (Consider location/radiation/quality/duration/timing/severity/associated sxs/prior Treatment) Patient is a 25 y.o. female presenting with shortness of breath. The history is provided by the patient and medical records. No language interpreter was used.  Shortness of Breath Associated symptoms: chest pain, cough, headaches and vomiting   Associated symptoms: no abdominal pain, no fever, no neck pain and no rash     Naraly Overby is a 25 y.o. female  with a PMH of DM, OSA, obesity, asthma who presents to the Emergency Department complaining of worsening productive cough, nausea, four episodes emesis, left sided headache, chest pressure and shortness of breath x 5 days. Patient states symptoms are worse with movement, sitting up, and stress. No alleviating factors noted. Patient received albuterol en route via EMS and states that did not improve her symptoms. Also given 324 ASA en route. Seen in ED on 5/24 for similar symptoms were she was treated with rocephin and levaquin for PNA. Patient states since that time, Symptoms initially improved, but did not resolve, and patient now feels like the symptoms are returning. Also admitted for PNA in March. Denies fever, congestion, abdominal pain, sick contacts.    Past Medical History  Diagnosis Date  . Mental disorder   . Depression   . Asthma   . OSA (obstructive sleep apnea)   . Extreme obesity (HCC)   . Diabetes mellitus type 2, uncontrolled (HCC)   . Family history of adverse reaction to anesthesia     " my grandmother had an allergic reaction   Past Surgical History  Procedure Laterality Date  . Appendectomy    . Tonsillectomy    . Cholecystectomy N/A 09/05/2014    Procedure: LAPAROSCOPIC CHOLECYSTECTOMY;  Surgeon: Axel FillerArmando Ramirez, MD;  [facility];  Service: General;  Laterality: N/A;   Family History  Problem Relation Age of Onset  . Diabetes Mellitus II Maternal Grandmother    Social History  Substance Use Topics  . Smoking status: Never Smoker   . Smokeless tobacco: Never Used  . Alcohol Use: No   OB History    No data available     Review of Systems  Constitutional: Negative for fever and chills.  HENT: Negative for congestion.   Eyes: Negative for visual disturbance.  Respiratory: Positive for cough and shortness of breath.   Cardiovascular: Positive for chest pain. Negative for palpitations and leg swelling.  Gastrointestinal: Positive for nausea and vomiting. Negative for abdominal pain, diarrhea and constipation.  Genitourinary: Negative for dysuria.  Musculoskeletal: Negative for back pain and neck pain.  Skin: Negative for rash.  Neurological: Positive for headaches.      Allergies  Fish allergy  Home Medications   Prior to Admission medications   Medication Sig Start Date End Date Taking? Authorizing Provider  albuterol (PROVENTIL HFA;VENTOLIN HFA) 108 (90 BASE) MCG/ACT inhaler Inhale 2 puffs into the lungs every 6 (six) hours as needed for wheezing or shortness of breath.   Yes Historical Provider, MD  ibuprofen (ADVIL,MOTRIN) 200 MG tablet Take 400 mg by mouth every 6 (six) hours as needed (pain).   Yes Historical Provider, MD  metFORMIN (GLUCOPHAGE) 500 MG tablet Take 1 tablet (500 mg total) by mouth 2 (two) times daily with a meal. Patient not taking: Reported on 12/06/2015 11/06/15  Nicole Pisciotta, PA-C  Norgestimate-Ethinyl Estradiol Triphasic (ORTHO TRI-CYCLEN, 28,) 0.18/0.215/0.25 MG-35 MCG tablet Take 1 tablet by mouth daily. Patient not taking: Reported on 12/06/2015 11/06/15   Joni Reining Pisciotta, PA-C  polyethylene glycol (MIRALAX / GLYCOLAX) packet Take 17 g by mouth daily as needed for mild constipation or moderate constipation. Patient not taking: Reported on 09/02/2015 09/06/14   Nonie Hoyer, PA-C   BP 138/84 mmHg  Pulse 110  Temp(Src) 99.2 F (37.3 C) (Oral)  Resp 22  SpO2 95%  LMP 10/03/2015 Physical Exam  Constitutional: She is oriented to person, place, and time.  WDWN obese female in NAD.  HENT:  Head: Normocephalic and atraumatic.  Mouth/Throat: Oropharynx is clear and moist. No oropharyngeal exudate.  Neck: Normal range of motion. Neck supple.  No meningeal signs.  Cardiovascular: Normal heart sounds and intact distal pulses.  Exam reveals no gallop and no friction rub.   No murmur heard. Tachycardic but regular.  Pulmonary/Chest: Effort normal. No respiratory distress. She has wheezes (Faint expiratory wheezes). She has no rales. She exhibits no tenderness.  Abdominal: Soft. Bowel sounds are normal. She exhibits no distension and no mass. There is no tenderness. There is no rebound and no guarding.  Musculoskeletal: She exhibits no edema.  Lymphadenopathy:    She has no cervical adenopathy.  Neurological: She is alert and oriented to person, place, and time.  Skin: Skin is warm and dry.  Nursing note and vitals reviewed.   ED Course  Procedures (including critical care time) Labs Review Labs Reviewed  BASIC METABOLIC PANEL - Abnormal; Notable for the following:    Glucose, Bld 109 (*)    All other components within normal limits  CBC WITH DIFFERENTIAL/PLATELET - Abnormal; Notable for the following:    Hemoglobin 10.8 (*)    HCT 35.3 (*)    MCH 25.5 (*)    All other components within normal limits  I-STAT TROPOININ, ED  I-STAT BETA HCG BLOOD, ED ([facility], [facility], [facility] ONLY)    Imaging Review No results found. I have personally reviewed and evaluated these images and lab results as part of my medical decision-making.   EKG Interpretation   Date/Time:  Friday December 06 2015 12:49:13 EDT Ventricular Rate:  116 PR Interval:    QRS Duration: 113 QT Interval:  345 QTC Calculation: 480 R Axis:   -18 Text Interpretation:  Sinus tachycardia Incomplete  right bundle branch  block Borderline prolonged QT interval Confirmed by Ranae Palms  MD, DAVID  ([id number]) on 12/06/2015 3:03:55 PM      MDM   Final diagnoses:  None   Yiselle Dowe presents to ED for cough, sob, headache. Hx of asthma and PNA in March 2017 as well as May 2017. Patient states symptoms improved, but did not resolve after diagnosed with pneumonia in May. She feels as if the symptoms are returning again. Labs reviewed and reassuring. EKG reviewed. Not febrile. Chest x-ray reviewed and shows bilateral infiltrates. Likely pneumonia, possibly pulmonary edema. Clinically appears more infectious in etiology. Patient was ambulated and sats remained 99%. Patient was speaking in full sentences with me while ambulating throughout the room. Will treat with Levaquin and have pulmonology follow-up. Patient was informed of the importance of follow-up with pulmonologist and has agreed to do so. Return precautions were also discussed. Patient agrees with treatment plan as dictated above.   Patient seen by and discussed with Dr. Ranae Palms who agrees with treatment plan.  Disposition was also discussed with Dr. Jodi Mourning prior to  discharge who agrees with plan.   [facility] LLCJaime Pilcher Lilja Soland, PA-C 12/06/15 1629  Loren Raceravid Yelverton, MD 12/07/15 [phone]

## 2015-12-06 NOTE — ED Notes (Signed)
Pt here c/o feeling bad since Sunday with chills, headache, pains to both sides, n/v. Also has been SOB with hx of asthma. EMS gave 5 mg albuterol for wheezing. 97% RA. HR 120 with incomplete RBBB. BP 200/120. Pt tearful, anxious. given 324 aspirin. Pt is a x 4.

## 2015-12-06 NOTE — ED Notes (Signed)
Placed patient into a gown and on the monitor 

## 2015-12-06 NOTE — ED Notes (Signed)
Secretary calling x-ray to make aware that patient pregnancy test has come back.

## 2016-01-28 ENCOUNTER — Emergency Department (HOSPITAL_COMMUNITY): Payer: Self-pay

## 2016-01-28 ENCOUNTER — Emergency Department (HOSPITAL_COMMUNITY)
Admission: EM | Admit: 2016-01-28 | Discharge: 2016-01-28 | Disposition: A | Discharge status: Home / Self Care | Payer: Self-pay | Attending: Emergency Medicine | Admitting: Emergency Medicine

## 2016-01-28 ENCOUNTER — Encounter (HOSPITAL_COMMUNITY): Payer: Self-pay

## 2016-01-28 DIAGNOSIS — Y999 Unspecified external cause status: Secondary | ICD-10-CM | POA: Insufficient documentation

## 2016-01-28 DIAGNOSIS — Y939 Activity, unspecified: Secondary | ICD-10-CM | POA: Insufficient documentation

## 2016-01-28 DIAGNOSIS — J45901 Unspecified asthma with (acute) exacerbation: Secondary | ICD-10-CM | POA: Insufficient documentation

## 2016-01-28 DIAGNOSIS — Y92009 Unspecified place in unspecified non-institutional (private) residence as the place of occurrence of the external cause: Secondary | ICD-10-CM | POA: Insufficient documentation

## 2016-01-28 DIAGNOSIS — W19XXXA Unspecified fall, initial encounter: Secondary | ICD-10-CM | POA: Insufficient documentation

## 2016-01-28 DIAGNOSIS — S8392XA Sprain of unspecified site of left knee, initial encounter: Secondary | ICD-10-CM | POA: Insufficient documentation

## 2016-01-28 DIAGNOSIS — Z7984 Long term (current) use of oral hypoglycemic drugs: Secondary | ICD-10-CM | POA: Insufficient documentation

## 2016-01-28 DIAGNOSIS — E119 Type 2 diabetes mellitus without complications: Secondary | ICD-10-CM | POA: Insufficient documentation

## 2016-01-28 MED ORDER — IBUPROFEN 800 MG PO TABS
800.0000 mg | ORAL_TABLET | Freq: Once | ORAL | Status: AC
  Administered 2016-01-28: 800 mg via ORAL

## 2016-01-28 MED ORDER — IBUPROFEN 800 MG PO TABS: 800.0000 mg | ORAL_TABLET | Freq: Three times a day (TID) | ORAL | 0 refills | Status: DC

## 2016-01-28 MED ORDER — IBUPROFEN 800 MG PO TABS
800.0000 mg | ORAL_TABLET | Freq: Once | ORAL | Status: DC
  Filled 2016-01-28 (×2): qty 1

## 2016-01-28 NOTE — ED Triage Notes (Signed)
PT RECEIVED FROM HOME VIA EMS C/O LEFT KNEE PAIN AND SWELLING DUE TO A FALL. PT HAD LEFT KNEE DISLOCATION X1 WEEK AGO, AND FELL ON THE KNEE AGAIN TONIGHT ON THE GRASS. PT WAS GIVEN CRUTCHES, BUT WAS NOT USING THEM AT THE TIME OF THE FALL. DENIES OTHER INJURIES OR LOC.

## 2016-01-28 NOTE — ED Notes (Signed)
PT DISCHARGED. INSTRUCTIONS AND PRESCRIPTION GIVEN. AAOX4. PT IN NO APPARENT DISTRESS. THE OPPORTUNITY TO ASK QUESTIONS WAS PROVIDED. 

## 2016-01-28 NOTE — ED Provider Notes (Signed)
[facility]-EMERGENCY DEPT Provider Note   CSN: [id number] Arrival date & time: 01/28/16  1956   By signing my name below, I, Yvette Gibbs, attest that this documentation has been prepared under the direction and in the presence of Langston MaskerKaren Brave Dack, [place]PA-C. Electronically signed by: Yvette Gibbs, ED Scribe. 01/28/16. 8:35 PM.   History   Chief Complaint Chief Complaint  Patient presents with  . Knee Pain    LEFT     The history is provided by the patient. No language interpreter was used.   HPI Comments:  Yvette Gibbs is a 25 y.o. female brought in by ambulance, who presents to the Emergency Department complaining of left knee pain status post fall, which occurred today. Pt reports that she previously fell a week ago and dislocated her left knee. Pt was seen at urgent care and was given crutches. Associated symptoms include pain across the left knee and swelling in affected area. Denies loss of consciousness or other injuries.  Past Medical History:  Diagnosis Date  . Asthma   . Depression   . Diabetes mellitus type 2, uncontrolled (HCC)   . Extreme obesity (HCC)   . Family history of adverse reaction to anesthesia    " my grandmother had an allergic reaction  . Mental disorder   . OSA (obstructive sleep apnea)     Patient Active Problem List   Diagnosis Date Noted  . HCAP (healthcare-associated pneumonia) 09/07/2015  . Sepsis (HCC) 09/07/2015  . Asthma 09/07/2015  . Pain in the chest   . Dyspnea 09/01/2015  . Pleuritic chest pain 09/01/2015  . Morbid obesity (HCC) 09/01/2015  . Asthma exacerbation 09/01/2015  . OSA (obstructive sleep apnea) 09/01/2015  . Influenza A 09/01/2015  . Extreme obesity (HCC) 09/06/2014  . Diabetes mellitus type 2, uncontrolled (HCC) 09/06/2014  . Biliary colic 09/04/2014    Past Surgical History:  Procedure Laterality Date  . APPENDECTOMY    . CHOLECYSTECTOMY N/A 09/05/2014   Procedure: LAPAROSCOPIC CHOLECYSTECTOMY;  Surgeon: Axel FillerArmando Ramirez, MD;   Location: [facility] OR;  Service: General;  Laterality: N/A;  . TONSILLECTOMY      OB History    No data available       Home Medications    Prior to Admission medications   Medication Sig Start Date End Date Taking? Authorizing Provider  albuterol (PROVENTIL HFA;VENTOLIN HFA) 108 (90 BASE) MCG/ACT inhaler Inhale 2 puffs into the lungs every 6 (six) hours as needed for wheezing or shortness of breath.    Historical Provider, MD  ibuprofen (ADVIL,MOTRIN) 200 MG tablet Take 400 mg by mouth every 6 (six) hours as needed (pain).    Historical Provider, MD  levofloxacin (LEVAQUIN) 750 MG tablet Take 1 tablet (750 mg total) by mouth daily. X 7 days 12/06/15   [facility] Center[facility] Pilcher [facility], PA-C  metFORMIN (GLUCOPHAGE) 500 MG tablet Take 1 tablet (500 mg total) by mouth 2 (two) times daily with a meal. Patient not taking: Reported on 12/06/2015 11/06/15   Joni ReiningNicole Pisciotta, PA-C  Norgestimate-Ethinyl Estradiol Triphasic (ORTHO TRI-CYCLEN, 28,) 0.18/0.215/0.25 MG-35 MCG tablet Take 1 tablet by mouth daily. Patient not taking: Reported on 12/06/2015 11/06/15   Joni ReiningNicole Pisciotta, PA-C  polyethylene glycol (MIRALAX / GLYCOLAX) packet Take 17 g by mouth daily as needed for mild constipation or moderate constipation. Patient not taking: Reported on 09/02/2015 09/06/14   Nonie HoyerMegan N Baird, PA-C    Family History Family History  Problem Relation Age of Onset  . Diabetes Mellitus II Maternal Grandmother  Social History Social History  Substance Use Topics  . Smoking status: Never Smoker  . Smokeless tobacco: Never Used  . Alcohol use No     Allergies   Pollen extract and Fish allergy   Review of Systems Review of Systems  Musculoskeletal: Positive for arthralgias, joint swelling and myalgias.       Left knee.  Neurological: Negative for syncope.  All other systems reviewed and are negative.    Physical Exam Updated Vital Signs BP 126/71 (BP Location: Right Arm)   Pulse 81   Temp 97.8 F (36.6 C) (Oral)    Resp 20   Ht 5\' 2"  (1.575 m)   Wt (!) 342 lb (155.1 kg)   LMP 01/04/2016   SpO2 99%   BMI 62.55 kg/m   Physical Exam  Constitutional: She is oriented to person, place, and time. She appears well-developed and well-nourished.  HENT:  Head: Normocephalic and atraumatic.  Eyes: Conjunctivae are normal. Right eye exhibits no discharge. Left eye exhibits no discharge.  Pulmonary/Chest: Effort normal. No respiratory distress.  Musculoskeletal: She exhibits tenderness.  Tender left patella. Pain with ROM. Neurovascular neurosensory are intact.  Neurological: She is alert and oriented to person, place, and time. Coordination normal.  Skin: Skin is warm and dry. No rash noted. She is not diaphoretic. No erythema.  Psychiatric: She has a normal mood and affect.  Nursing note and vitals reviewed.    ED Treatments / Results  DIAGNOSTIC STUDIES:  Oxygen Saturation is 99% on room air, normal by my interpretation.    COORDINATION OF CARE:  8:25 PM Discussed treatment plan with pt at bedside, which includes x-ray of left knee, and pt agreed to plan.  Labs (all labs ordered are listed, but only abnormal results are displayed) Labs Reviewed - No data to display  EKG  EKG Interpretation None       Radiology No results found.  Procedures Procedures (including critical care time)  Medications Ordered in ED Medications - No data to display   Initial Impression / Assessment and Plan / ED Course  I have reviewed the triage vital signs and the nursing notes.  Pertinent labs & imaging results that were available during my care of the patient were reviewed by me and considered in my medical decision making (see chart for details).  Clinical Course     Final Clinical Impressions(s) / ED Diagnoses   Final diagnoses:  Left knee sprain, initial encounter    New Prescriptions New Prescriptions   IBUPROFEN (ADVIL,MOTRIN) 800 MG TABLET    Take 1 tablet (800 mg total) by mouth  3 (three) times daily.   Knee imbolizer An After Visit Summary was printed and given to the patient.   Lonia SkinnerLeslie K Granite BaySofia, PA-C 01/28/16 2152    Benjiman CoreNathan Pickering, MD 01/29/16 2114

## 2016-02-27 ENCOUNTER — Other Ambulatory Visit: Payer: Self-pay

## 2016-02-27 DIAGNOSIS — J45909 Unspecified asthma, uncomplicated: Secondary | ICD-10-CM | POA: Insufficient documentation

## 2016-02-27 DIAGNOSIS — R0789 Other chest pain: Secondary | ICD-10-CM | POA: Insufficient documentation

## 2016-02-27 DIAGNOSIS — Z7984 Long term (current) use of oral hypoglycemic drugs: Secondary | ICD-10-CM | POA: Insufficient documentation

## 2016-02-27 DIAGNOSIS — R0602 Shortness of breath: Secondary | ICD-10-CM | POA: Insufficient documentation

## 2016-02-27 DIAGNOSIS — E119 Type 2 diabetes mellitus without complications: Secondary | ICD-10-CM | POA: Insufficient documentation

## 2016-02-28 ENCOUNTER — Encounter (HOSPITAL_COMMUNITY): Payer: Self-pay | Admitting: *Deleted

## 2016-02-28 ENCOUNTER — Emergency Department (HOSPITAL_COMMUNITY): Payer: Self-pay

## 2016-02-28 ENCOUNTER — Emergency Department (HOSPITAL_COMMUNITY)
Admission: EM | Admit: 2016-02-28 | Discharge: 2016-02-28 | Disposition: A | Discharge status: Home / Self Care | Payer: Self-pay | Attending: Emergency Medicine | Admitting: Emergency Medicine

## 2016-02-28 DIAGNOSIS — R0602 Shortness of breath: Secondary | ICD-10-CM

## 2016-02-28 DIAGNOSIS — R0789 Other chest pain: Secondary | ICD-10-CM

## 2016-02-28 LAB — CBC WITH DIFFERENTIAL/PLATELET
Basophils Absolute: 0 10*3/uL (ref 0.0–0.1)
Basophils Relative: 0 %
Eosinophils Absolute: 0.1 10*3/uL (ref 0.0–0.7)
Eosinophils Relative: 1 %
HCT: 39.1 % (ref 36.0–46.0)
Hemoglobin: 12 g/dL (ref 12.0–15.0)
Lymphocytes Relative: 29 %
Lymphs Abs: 2 10*3/uL (ref 0.7–4.0)
MCH: 27.3 pg (ref 26.0–34.0)
MCHC: 30.7 g/dL (ref 30.0–36.0)
MCV: 88.9 fL (ref 78.0–100.0)
Monocytes Absolute: 0.7 10*3/uL (ref 0.1–1.0)
Monocytes Relative: 10 %
Neutro Abs: 4 10*3/uL (ref 1.7–7.7)
Neutrophils Relative %: 60 %
Platelets: 279 10*3/uL (ref 150–400)
RBC: 4.4 MIL/uL (ref 3.87–5.11)
RDW: 14.2 % (ref 11.5–15.5)
WBC: 6.9 10*3/uL (ref 4.0–10.5)

## 2016-02-28 LAB — BASIC METABOLIC PANEL
Anion gap: 9 (ref 5–15)
BUN: 8 mg/dL (ref 6–20)
CO2: 28 mmol/L (ref 22–32)
Calcium: 9.9 mg/dL (ref 8.9–10.3)
Chloride: 101 mmol/L (ref 101–111)
Creatinine, Ser: 0.63 mg/dL (ref 0.44–1.00)
GFR calc Af Amer: >60 mL/min (ref >60)
GFR calc non Af Amer: >60 mL/min (ref >60)
Glucose, Bld: 147 mg/dL — ABNORMAL HIGH (ref 65–99)
Potassium: 4.3 mmol/L (ref 3.5–5.1)
Sodium: 138 mmol/L (ref 135–145)

## 2016-02-28 LAB — I-STAT TROPONIN, ED: Troponin i, poc: 0 ng/mL (ref 0.00–0.08)

## 2016-02-28 MED ORDER — IPRATROPIUM BROMIDE 0.02 % IN SOLN
0.5000 mg | Freq: Once | RESPIRATORY_TRACT | Status: AC
  Administered 2016-02-28: 0.5 mg via RESPIRATORY_TRACT
  Filled 2016-02-28: qty 2.5

## 2016-02-28 MED ORDER — PREDNISONE 20 MG PO TABS: 40.0000 mg | ORAL_TABLET | Freq: Every day | ORAL | 0 refills | Status: DC

## 2016-02-28 MED ORDER — KETOROLAC TROMETHAMINE 30 MG/ML IJ SOLN
30.0000 mg | Freq: Once | INTRAMUSCULAR | Status: AC
  Administered 2016-02-28: 30 mg via INTRAVENOUS
  Filled 2016-02-28: qty 1

## 2016-02-28 MED ORDER — ALBUTEROL SULFATE (2.5 MG/3ML) 0.083% IN NEBU
5.0000 mg | INHALATION_SOLUTION | Freq: Once | RESPIRATORY_TRACT | Status: AC
  Administered 2016-02-28: 5 mg via RESPIRATORY_TRACT
  Filled 2016-02-28: qty 6

## 2016-02-28 MED ORDER — SODIUM CHLORIDE 0.9 % IV BOLUS (SEPSIS)
1000.0000 mL | Freq: Once | INTRAVENOUS | Status: AC
  Administered 2016-02-28: 1000 mL via INTRAVENOUS

## 2016-02-28 MED ORDER — DIPHENHYDRAMINE HCL 25 MG PO CAPS
25.0000 mg | ORAL_CAPSULE | Freq: Once | ORAL | Status: AC
  Administered 2016-02-28: 25 mg via ORAL
  Filled 2016-02-28: qty 1

## 2016-02-28 MED ORDER — METOCLOPRAMIDE HCL 5 MG/ML IJ SOLN
10.0000 mg | Freq: Once | INTRAMUSCULAR | Status: AC
  Administered 2016-02-28: 10 mg via INTRAVENOUS
  Filled 2016-02-28: qty 2

## 2016-02-28 NOTE — Discharge Instructions (Signed)
Please read and follow all provided instructions.  Your diagnoses today include:  1. Shortness of breath   2. Chest tightness     Tests performed today include:  Chest x-ray - no pneumonia  Blood counts and electrolytes  Blood test for heart muscle damage - normal  Vital signs. See below for your results today.   Medications prescribed:   Prednisone - steroid medicine   It is best to take this medication in the morning to prevent sleeping problems. If you are diabetic, monitor your blood sugar closely and stop taking Prednisone if blood sugar is over 300. Take with food to prevent stomach upset.   Take any prescribed medications only as directed.  Home care instructions:  Follow any educational materials contained in this packet.  Follow-up instructions: Please follow-up with your primary care provider in the next 2 days for further evaluation of your symptoms and management of your asthma.  Return instructions:   Please return to the Emergency Department if you experience worsening symptoms.  Please return with worsening wheezing, shortness of breath, or difficulty breathing.  Return with persistent fever above 101F.   Please return if you have any other emergent concerns.  Additional Information:  Your vital signs today were: BP 120/81    Pulse 100    Temp 98.6 F (37 C) (Oral)    Resp 19    LMP 02/07/2016 (Exact Date)    SpO2 96%  If your blood pressure (BP) was elevated above 135/85 this visit, please have this repeated by your doctor within one month. --------------

## 2016-02-28 NOTE — ED Notes (Signed)
Pt c/o 8/10 Jaw pain, but very sleepy and snoring when this RN try to assess pain level.

## 2016-02-28 NOTE — ED Provider Notes (Signed)
[facility]-EMERGENCY DEPT Provider Note   CSN: [id number] Arrival date & time: 02/27/16  2337     History   Chief Complaint Chief Complaint  Patient presents with  . Chest Pain    HPI Yvette Gibbs is a 25 y.o. female.  Patient with history of asthma, pneumonia, type 2 diabetes mellitus -- presents with two-week history of shortness of breath, chest pain, dizziness described as spinning, headache, one episode of vomiting that has become worse over the past 2 days. Patient has had wheezing at times. She has used her inhaler without relief. No documented fevers. Patient states that she has swelling in her face that she attributes to wisdom teeth. Chest pain is in the mid chest. Patient denies risk factors for pulmonary embolism including: unilateral leg swelling, history of DVT/PE/other blood clots, use of exogenous hormones, recent immobilizations, recent surgery, recent travel (>4hr segment), malignancy, hemoptysis.        Past Medical History:  Diagnosis Date  . Asthma   . Depression   . Diabetes mellitus type 2, uncontrolled (HCC)   . Extreme obesity (HCC)   . Family history of adverse reaction to anesthesia    " my grandmother had an allergic reaction  . Mental disorder   . OSA (obstructive sleep apnea)     Patient Active Problem List   Diagnosis Date Noted  . HCAP (healthcare-associated pneumonia) 09/07/2015  . Sepsis (HCC) 09/07/2015  . Asthma 09/07/2015  . Pain in the chest   . Dyspnea 09/01/2015  . Pleuritic chest pain 09/01/2015  . Morbid obesity (HCC) 09/01/2015  . Asthma exacerbation 09/01/2015  . OSA (obstructive sleep apnea) 09/01/2015  . Influenza A 09/01/2015  . Extreme obesity (HCC) 09/06/2014  . Diabetes mellitus type 2, uncontrolled (HCC) 09/06/2014  . Biliary colic 09/04/2014    Past Surgical History:  Procedure Laterality Date  . APPENDECTOMY    . CHOLECYSTECTOMY N/A 09/05/2014   Procedure: LAPAROSCOPIC CHOLECYSTECTOMY;  Surgeon: Axel FillerArmando  Ramirez, MD;  Location: [facility] OR;  Service: General;  Laterality: N/A;  . TONSILLECTOMY      OB History    No data available       Home Medications    Prior to Admission medications   Medication Sig Start Date End Date Taking? Authorizing Provider  albuterol (PROVENTIL HFA;VENTOLIN HFA) 108 (90 BASE) MCG/ACT inhaler Inhale 2 puffs into the lungs every 6 (six) hours as needed for wheezing or shortness of breath.    Historical Provider, MD  ibuprofen (ADVIL,MOTRIN) 800 MG tablet Take 1 tablet (800 mg total) by mouth 3 (three) times daily. 01/28/16   Elson AreasLeslie K Sofia, PA-C  levofloxacin (LEVAQUIN) 750 MG tablet Take 1 tablet (750 mg total) by mouth daily. X 7 days 12/06/15   [facility], PA-C  metFORMIN (GLUCOPHAGE) 500 MG tablet Take 1 tablet (500 mg total) by mouth 2 (two) times daily with a meal. Patient not taking: Reported on 12/06/2015 11/06/15   Joni ReiningNicole Pisciotta, PA-C  Norgestimate-Ethinyl Estradiol Triphasic (ORTHO TRI-CYCLEN, 28,) 0.18/0.215/0.25 MG-35 MCG tablet Take 1 tablet by mouth daily. Patient not taking: Reported on 12/06/2015 11/06/15   Joni ReiningNicole Pisciotta, PA-C  polyethylene glycol (MIRALAX / GLYCOLAX) packet Take 17 g by mouth daily as needed for mild constipation or moderate constipation. Patient not taking: Reported on 09/02/2015 09/06/14   Nonie HoyerMegan N Baird, PA-C    Family History Family History  Problem Relation Age of Onset  . Diabetes Mellitus II Maternal Grandmother     Social History Social History  Substance  Use Topics  . Smoking status: Never Smoker  . Smokeless tobacco: Never Used  . Alcohol use No     Allergies   Pollen extract and Fish allergy   Review of Systems Review of Systems  All other systems reviewed and are negative.    Physical Exam Updated Vital Signs BP 174/100 (BP Location: Left Arm)   Pulse 96   Temp 98.6 F (37 C) (Oral)   Resp 22   LMP 02/07/2016 (Exact Date)   SpO2 100%   Physical Exam  Constitutional: She is oriented to  person, place, and time. She appears well-developed and well-nourished. No distress.  HENT:  Head: Normocephalic and atraumatic.  Nose: Nose normal.  Mouth/Throat: Oropharynx is clear and moist.  Eyes: Conjunctivae are normal. Pupils are equal, round, and reactive to light. Right eye exhibits no discharge. Left eye exhibits no discharge.  Neck: Normal range of motion. Neck supple.  Cardiovascular: Normal rate, regular rhythm and normal heart sounds.   No murmur heard. Pulmonary/Chest: Effort normal and breath sounds normal. No respiratory distress. She has no wheezes. She has no rales.  Abdominal: Soft. There is no tenderness.  Neurological: She is alert and oriented to person, place, and time. No cranial nerve deficit. She exhibits normal muscle tone.  Skin: Skin is warm and dry.  Psychiatric: She has a normal mood and affect.  Nursing note and vitals reviewed.    ED Treatments / Results  Labs (all labs ordered are listed, but only abnormal results are displayed) Labs Reviewed  BASIC METABOLIC PANEL - Abnormal; Notable for the following:       Result Value   Glucose, Bld 147 (*)    All other components within normal limits  CBC WITH DIFFERENTIAL/PLATELET  Rosezena Sensor, ED    ED ECG REPORT   Date: 02/28/2016  Rate: 100  Rhythm: normal sinus rhythm  QRS Axis: right  Intervals: QT prolonged  ST/T Wave abnormalities: normal  Conduction Disutrbances:right bundle branch block, incomplete  Narrative Interpretation:   Old EKG Reviewed: unchanged  I have personally reviewed the EKG tracing and agree with the computerized printout as noted.  Radiology Dg Chest 2 View  Result Date: 02/28/2016 CLINICAL DATA:  Chest pain and shortness of breath. Symptom onset 5 hours prior. EXAM: CHEST  2 VIEW COMPARISON:  Chest radiograph 12/06/2015 FINDINGS: Lung volumes are low. Improved perihilar aeration with residual peribronchial thickening. No new airspace disease. No pleural effusion  or pneumothorax. Unchanged osseous structures. Obese body habitus. IMPRESSION: Residual or recurrent peribronchial thickening. Overall improved aeration from prior exam. Electronically Signed   By: Rubye Oaks M.D.   On: 02/28/2016 01:06    Procedures Procedures (including critical care time)  Medications Ordered in ED Medications  albuterol (PROVENTIL) (2.5 MG/3ML) 0.083% nebulizer solution 5 mg (5 mg Nebulization Given 02/28/16 0434)  ipratropium (ATROVENT) nebulizer solution 0.5 mg (0.5 mg Nebulization Given 02/28/16 0434)  sodium chloride 0.9 % bolus 1,000 mL (1,000 mLs Intravenous New Bag/Given 02/28/16 0434)  metoCLOPramide (REGLAN) injection 10 mg (10 mg Intravenous Given 02/28/16 0434)  diphenhydrAMINE (BENADRYL) capsule 25 mg (25 mg Oral Given 02/28/16 0434)  ketorolac (TORADOL) 30 MG/ML injection 30 mg (30 mg Intravenous Given 02/28/16 0456)     Initial Impression / Assessment and Plan / ED Course  I have reviewed the triage vital signs and the nursing notes.  Pertinent labs & imaging results that were available during my care of the patient were reviewed by me and considered in my  medical decision making (see chart for details).  Clinical Course   Patient seen and examined. Work-up reviewed with patient at bedside. Will treat symptoms. No indication for admission. Symptoms most typical of bronchospasm. Low suspicion for PE/ACS. CXR improved from previous, no PNA. Medications ordered.   Vital signs reviewed and are as follows: BP 174/100 (BP Location: Left Arm)   Pulse 96   Temp 98.6 F (37 C) (Oral)   Resp 22   LMP 02/07/2016 (Exact Date)   SpO2 100%   6:27 AM Patient Sleeping comfortably in room. States headache is improved. Will discharge to home with prednisone. Patient confirms that she has albuterol at home to use.  Encouraged follow-up with her primary care physician.  Encouraged return to the emergency department with fever, difficulty breathing, worsening  shortness of breath, new or changing symptoms. Patient verbalizes understanding and agrees with plan.  Final Clinical Impressions(s) / ED Diagnoses   Final diagnoses:  Shortness of breath  Chest tightness   Patient with multiple complaints, history of asthma, history of pneumonia. Main complaint is shortness of breath. Chest x-ray today is improved from previous without any obvious pneumonias. Patient does not have other systemic symptoms of illness. She is not hypoxic. EKG is unchanged. Troponin is negative. Patient has had some chest tightness/pain ongoing for 2 weeks. No concern for ACS. No risk factors for PE. Symptoms are most consistent with asthma/bronchospasm. Treated in emergency department. Blood sugar is controlled. Patient discharged home on prednisone. Follow-up and return instructions as above.  New Prescriptions New Prescriptions   No medications on file     Renne Crigler, PA-C 02/28/16 1610    Derwood Kaplan, MD 02/28/16 [phone]

## 2016-02-28 NOTE — ED Triage Notes (Signed)
C/o CP, onset 1830, also sob, nv, dizziness and HA. Central chest heaviness, also reports L jaw pain. Pt alert, NAD, calm, interactive, resps e/u, no dyspnea noted, took tylenol at 1930 w/o relief, skin W&D.

## 2016-04-17 ENCOUNTER — Emergency Department (HOSPITAL_COMMUNITY): Payer: Medicaid Other

## 2016-04-17 ENCOUNTER — Encounter (HOSPITAL_COMMUNITY): Payer: Self-pay | Admitting: Emergency Medicine

## 2016-04-17 ENCOUNTER — Emergency Department (HOSPITAL_COMMUNITY)
Admission: EM | Admit: 2016-04-17 | Discharge: 2016-04-17 | Disposition: A | Discharge status: Home / Self Care | Payer: Medicaid Other | Attending: Emergency Medicine | Admitting: Emergency Medicine

## 2016-04-17 DIAGNOSIS — J45909 Unspecified asthma, uncomplicated: Secondary | ICD-10-CM | POA: Insufficient documentation

## 2016-04-17 DIAGNOSIS — E119 Type 2 diabetes mellitus without complications: Secondary | ICD-10-CM | POA: Insufficient documentation

## 2016-04-17 DIAGNOSIS — K08409 Partial loss of teeth, unspecified cause, unspecified class: Secondary | ICD-10-CM

## 2016-04-17 DIAGNOSIS — Z7984 Long term (current) use of oral hypoglycemic drugs: Secondary | ICD-10-CM | POA: Diagnosis not present

## 2016-04-17 DIAGNOSIS — K0889 Other specified disorders of teeth and supporting structures: Secondary | ICD-10-CM | POA: Diagnosis present

## 2016-04-17 DIAGNOSIS — K029 Dental caries, unspecified: Secondary | ICD-10-CM | POA: Diagnosis not present

## 2016-04-17 LAB — BASIC METABOLIC PANEL
Anion gap: 10 (ref 5–15)
BUN: 7 mg/dL (ref 6–20)
CO2: 23 mmol/L (ref 22–32)
Calcium: 9.6 mg/dL (ref 8.9–10.3)
Chloride: 106 mmol/L (ref 101–111)
Creatinine, Ser: 0.64 mg/dL (ref 0.44–1.00)
GFR calc Af Amer: >60 mL/min (ref >60)
GFR calc non Af Amer: >60 mL/min (ref >60)
Glucose, Bld: 115 mg/dL — ABNORMAL HIGH (ref 65–99)
Potassium: 3.7 mmol/L (ref 3.5–5.1)
Sodium: 139 mmol/L (ref 135–145)

## 2016-04-17 LAB — CBC
HCT: 39.7 % (ref 36.0–46.0)
Hemoglobin: 12.8 g/dL (ref 12.0–15.0)
MCH: 28 pg (ref 26.0–34.0)
MCHC: 32.2 g/dL (ref 30.0–36.0)
MCV: 86.9 fL (ref 78.0–100.0)
Platelets: 244 10*3/uL (ref 150–400)
RBC: 4.57 MIL/uL (ref 3.87–5.11)
RDW: 13.2 % (ref 11.5–15.5)
WBC: 10.3 10*3/uL (ref 4.0–10.5)

## 2016-04-17 LAB — I-STAT TROPONIN, ED: Troponin i, poc: 0 ng/mL (ref 0.00–0.08)

## 2016-04-17 MED ORDER — KETOROLAC TROMETHAMINE 60 MG/2ML IM SOLN
60.0000 mg | Freq: Once | INTRAMUSCULAR | Status: AC
  Administered 2016-04-17: 60 mg via INTRAMUSCULAR
  Filled 2016-04-17: qty 2

## 2016-04-17 NOTE — ED Provider Notes (Signed)
[facility]-EMERGENCY DEPT Provider Note   CSN: [id number] Arrival date & time: 04/17/16  0116     History   Chief Complaint Chief Complaint  Patient presents with  . Shortness of Breath  . Dental Pain    HPI Yvette Gibbs is a 25 y.o. female.  HPI  Patient comes to the ER for evaluation of CP and SOB. She has a PMH of diabetes, biliary colic, OSA, pleuritic chest pain, morbid obesity, asthma. She had her wisdom teeth removed yesterday and is still having pain from that, taking Oxycodone as needed.   This evening she had a reoccurrence of her usual CP with SOB, no wheezing. She states that she frequently gets these symptoms. She also is concerned about bleeding from her dental sockets and pain with swallowing. She is able to speak but reports not wanting to due to pain. Normal voice.   No N/V/D, fevers, syncope, weakness, wheezing, tachycardia, LE swelling.   Past Medical History:  Diagnosis Date  . Asthma   . Depression   . Diabetes mellitus type 2, uncontrolled (HCC)   . Extreme obesity (HCC)   . Family history of adverse reaction to anesthesia    " my grandmother had an allergic reaction  . Mental disorder   . OSA (obstructive sleep apnea)     Patient Active Problem List   Diagnosis Date Noted  . HCAP (healthcare-associated pneumonia) 09/07/2015  . Sepsis (HCC) 09/07/2015  . Asthma 09/07/2015  . Pain in the chest   . Dyspnea 09/01/2015  . Pleuritic chest pain 09/01/2015  . Morbid obesity (HCC) 09/01/2015  . Asthma exacerbation 09/01/2015  . OSA (obstructive sleep apnea) 09/01/2015  . Influenza A 09/01/2015  . Extreme obesity (HCC) 09/06/2014  . Diabetes mellitus type 2, uncontrolled (HCC) 09/06/2014  . Biliary colic 09/04/2014    Past Surgical History:  Procedure Laterality Date  . APPENDECTOMY    . CHOLECYSTECTOMY N/A 09/05/2014   Procedure: LAPAROSCOPIC CHOLECYSTECTOMY;  Surgeon: Axel Filler, MD;  Location: [facility] OR;  Service: General;  Laterality: N/A;    . TONSILLECTOMY      OB History    No data available       Home Medications    Prior to Admission medications   Medication Sig Start Date End Date Taking? Authorizing Provider  amoxicillin (AMOXIL) 500 MG capsule Take 500 mg by mouth 4 (four) times daily.   Yes Historical Provider, MD  oxyCODONE-acetaminophen (PERCOCET/ROXICET) 5-325 MG tablet Take 1-2 tablets by mouth every 4 (four) hours as needed for severe pain.   Yes Historical Provider, MD  ibuprofen (ADVIL,MOTRIN) 800 MG tablet Take 1 tablet (800 mg total) by mouth 3 (three) times daily. Patient not taking: Reported on 04/17/2016 01/28/16   Elson Areas, PA-C  levofloxacin (LEVAQUIN) 750 MG tablet Take 1 tablet (750 mg total) by mouth daily. X 7 days Patient not taking: Reported on 04/17/2016 12/06/15   [facility] Ward, PA-C  metFORMIN (GLUCOPHAGE) 500 MG tablet Take 1 tablet (500 mg total) by mouth 2 (two) times daily with a meal. Patient not taking: Reported on 04/17/2016 11/06/15   Joni Reining Pisciotta, PA-C  predniSONE (DELTASONE) 20 MG tablet Take 2 tablets (40 mg total) by mouth daily. Patient not taking: Reported on 04/17/2016 02/28/16   Renne Crigler, PA-C    Family History Family History  Problem Relation Age of Onset  . Diabetes Mellitus II Maternal Grandmother     Social History Social History  Substance Use Topics  . Smoking status: Never  Smoker  . Smokeless tobacco: Never Used  . Alcohol use No     Allergies   Pollen extract and Fish allergy   Review of Systems Review of Systems  Review of Systems All other systems negative except as documented in the HPI. All pertinent positives and negatives as reviewed in the HPI.  Physical Exam Updated Vital Signs BP 114/76   Pulse 85   Temp 98 F (36.7 C) (Axillary)   Resp 22   Ht 5\' 2"  (1.575 m)   Wt (!) 149.4 kg   SpO2 98%   BMI 60.25 kg/m   Physical Exam  Constitutional: She appears well-developed and well-nourished. No distress.  HENT:  Head:  Normocephalic and atraumatic.  Mouth/Throat: Uvula is midline, oropharynx is clear and moist and mucous membranes are normal. No trismus in the jaw. Normal dentition. Dental caries (Pts tooth shows no obvious abscess but moderate to severe tenderness to palpation of marked tooth) present. No dental abscesses or uvula swelling.    No tongue protrusion.  Eyes: Pupils are equal, round, and reactive to light.  Neck: Trachea normal, normal range of motion and full passive range of motion without pain. Neck supple.  Cardiovascular: Normal rate, regular rhythm, normal heart sounds and normal pulses.   Pulmonary/Chest: Effort normal. No respiratory distress. She has no decreased breath sounds. She has no wheezes.  Abdominal: Normal appearance.  Musculoskeletal: Normal range of motion.  Neurological: She is alert. She has normal strength.  Skin: Skin is warm, dry and intact. She is not diaphoretic.  Psychiatric: She has a normal mood and affect. Her speech is normal. Cognition and memory are normal.     ED Treatments / Results  Labs (all labs ordered are listed, but only abnormal results are displayed) Labs Reviewed  BASIC METABOLIC PANEL - Abnormal; Notable for the following:       Result Value   Glucose, Bld 115 (*)    All other components within normal limits  CBC  I-STAT TROPOININ, ED    EKG  EKG Interpretation None       Radiology Dg Chest 2 View  Result Date: 04/17/2016 CLINICAL DATA:  25 y/o F; shortness of breath, chills, and chest pain EXAM: CHEST  2 VIEW COMPARISON:  02/28/2016 chest radiograph FINDINGS: Stable cardiac silhouette within normal limits given projection and technique. Low lung volumes accentuate pulmonary markings. No focal consolidation. No pleural effusion or pneumothorax. No acute osseous abnormality is evident. IMPRESSION: No active cardiopulmonary disease. Electronically Signed   By: Mitzi HansenLance  Furusawa-Stratton M.D.   On: 04/17/2016 02:58     Procedures Procedures (including critical care time)  Medications Ordered in ED Medications  ketorolac (TORADOL) injection 60 mg (60 mg Intramuscular Given 04/17/16 0434)   Pt is well appearing, no objective findings of CP or SOB. No wheezing. Normal vital signs in the ED. Patient with dentalgia.  No abscess requiring immediate incision and drainage.  Exam not concerning for Ludwig's angina or pharyngeal abscess.  Will treat with a shot of Toradol in the ED. Pt instructed to follow-up with dentist.  Discussed return precautions. Pt safe for discharge. F/u with PCP regarding chronic CP/dyspnea, return precautions given.   Initial Impression / Assessment and Plan / ED Course  I have reviewed the triage vital signs and the nursing notes.  Pertinent labs & imaging results that were available during my care of the patient were reviewed by me and considered in my medical decision making (see chart for details).  Clinical  Course      Final Clinical Impressions(s) / ED Diagnoses   Final diagnoses:  History of third molar tooth extraction, unspecified edentulism class    New Prescriptions New Prescriptions   No medications on file     Marlon Peliffany Mallerie Blok, PA-C 04/17/16 0447    Azalia BilisKevin Campos, MD 04/17/16 [phone]

## 2016-04-17 NOTE — ED Triage Notes (Signed)
Pt presents to ED via EMS for assessment after having an episode of SOB with diaphoresis, chills, and CP starting approx 2 hours ago and resolving with EMS coaching.  Pt has a hx of anxiety.  Pt still c/o of some pressure in her chest, intermittent back and side pain all day.  Pt had her wisdom teeth removed yesterday and has a large amount of swelling and is stiill spitting up a significant amount of blood.  Pt unwilling to speak much in triage.  Most of triage done interpresting patient's hand signals.  When pt speak voice is clear.  Breath sounds equal.

## 2016-04-17 NOTE — ED Notes (Signed)
Patient transported to X-ray 

## 2016-09-23 ENCOUNTER — Encounter (HOSPITAL_COMMUNITY): Payer: Self-pay | Admitting: Emergency Medicine

## 2016-09-23 ENCOUNTER — Emergency Department (HOSPITAL_COMMUNITY)
Admission: EM | Admit: 2016-09-23 | Discharge: 2016-09-23 | Disposition: A | Discharge status: Home / Self Care | Payer: Medicaid Other | Attending: Emergency Medicine | Admitting: Emergency Medicine

## 2016-09-23 ENCOUNTER — Emergency Department (HOSPITAL_COMMUNITY): Payer: Medicaid Other

## 2016-09-23 DIAGNOSIS — R072 Precordial pain: Secondary | ICD-10-CM | POA: Diagnosis not present

## 2016-09-23 DIAGNOSIS — E119 Type 2 diabetes mellitus without complications: Secondary | ICD-10-CM | POA: Diagnosis not present

## 2016-09-23 DIAGNOSIS — Z79899 Other long term (current) drug therapy: Secondary | ICD-10-CM | POA: Diagnosis not present

## 2016-09-23 DIAGNOSIS — J45909 Unspecified asthma, uncomplicated: Secondary | ICD-10-CM | POA: Insufficient documentation

## 2016-09-23 DIAGNOSIS — R1013 Epigastric pain: Secondary | ICD-10-CM | POA: Diagnosis not present

## 2016-09-23 DIAGNOSIS — R079 Chest pain, unspecified: Secondary | ICD-10-CM | POA: Diagnosis present

## 2016-09-23 LAB — CBC WITH DIFFERENTIAL/PLATELET
Basophils Absolute: 0 10*3/uL (ref 0.0–0.1)
Basophils Relative: 1 %
Eosinophils Absolute: 0.1 10*3/uL (ref 0.0–0.7)
Eosinophils Relative: 3 %
HCT: 38.8 % (ref 36.0–46.0)
Hemoglobin: 12.3 g/dL (ref 12.0–15.0)
Lymphocytes Relative: 39 %
Lymphs Abs: 1.7 10*3/uL (ref 0.7–4.0)
MCH: 27.9 pg (ref 26.0–34.0)
MCHC: 31.7 g/dL (ref 30.0–36.0)
MCV: 88 fL (ref 78.0–100.0)
Monocytes Absolute: 0.5 10*3/uL (ref 0.1–1.0)
Monocytes Relative: 11 %
Neutro Abs: 2 10*3/uL (ref 1.7–7.7)
Neutrophils Relative %: 46 %
Platelets: 257 10*3/uL (ref 150–400)
RBC: 4.41 MIL/uL (ref 3.87–5.11)
RDW: 13.2 % (ref 11.5–15.5)
WBC: 4.3 10*3/uL (ref 4.0–10.5)

## 2016-09-23 LAB — COMPREHENSIVE METABOLIC PANEL
ALT: 29 U/L (ref 14–54)
AST: 26 U/L (ref 15–41)
Albumin: 3.5 g/dL (ref 3.5–5.0)
Alkaline Phosphatase: 57 U/L (ref 38–126)
Anion gap: 10 (ref 5–15)
BUN: 6 mg/dL (ref 6–20)
CO2: 26 mmol/L (ref 22–32)
Calcium: 9 mg/dL (ref 8.9–10.3)
Chloride: 102 mmol/L (ref 101–111)
Creatinine, Ser: 0.66 mg/dL (ref 0.44–1.00)
GFR calc Af Amer: >60 mL/min (ref >60)
GFR calc non Af Amer: >60 mL/min (ref >60)
Glucose, Bld: 109 mg/dL — ABNORMAL HIGH (ref 65–99)
Potassium: 4 mmol/L (ref 3.5–5.1)
Sodium: 138 mmol/L (ref 135–145)
Total Bilirubin: 0.3 mg/dL (ref 0.3–1.2)
Total Protein: 6.7 g/dL (ref 6.5–8.1)

## 2016-09-23 LAB — I-STAT BETA HCG BLOOD, ED (MC, WL, AP ONLY): I-stat hCG, quantitative: <5 m[IU]/mL (ref <5)

## 2016-09-23 LAB — I-STAT TROPONIN, ED: Troponin i, poc: 0 ng/mL (ref 0.00–0.08)

## 2016-09-23 MED ORDER — OMEPRAZOLE 20 MG PO CPDR: 20.0000 mg | DELAYED_RELEASE_CAPSULE | Freq: Every day | ORAL | 0 refills | Status: DC

## 2016-09-23 MED ORDER — GI COCKTAIL ~~LOC~~
30.0000 mL | Freq: Once | ORAL | Status: AC
  Administered 2016-09-23: 30 mL via ORAL
  Filled 2016-09-23: qty 30

## 2016-09-23 MED ORDER — DICYCLOMINE HCL 20 MG PO TABS: 20.0000 mg | ORAL_TABLET | Freq: Two times a day (BID) | ORAL | 0 refills | Status: DC

## 2016-09-23 MED ORDER — SUCRALFATE 1 GM/10ML PO SUSP: 1.0000 g | Freq: Three times a day (TID) | ORAL | 0 refills | Status: DC

## 2016-09-23 NOTE — ED Notes (Signed)
Patient transported to X-ray on heart monitor at this time.

## 2016-09-23 NOTE — ED Triage Notes (Addendum)
Per [facility] patient from home complaining of left sided chest pain since 9 am this morning that has gotten worse since then. Patient blood pressure 178/84 with no history of hypertension. Patient took 325 mg aspirin with no relief and EMS administered 1 Nitroglycerin with no relief.  Patient reports 10/10 on pain scale and radiation of pain from chest into left arm with numbness. Patient also complaining of shortness of breath and nausea.

## 2016-09-23 NOTE — Discharge Instructions (Signed)

## 2016-09-23 NOTE — ED Notes (Signed)
Patient transported to X-ray 

## 2016-09-23 NOTE — ED Provider Notes (Signed)
Emergency Department Provider Note   I have reviewed the triage vital signs and the nursing notes.   HISTORY  Chief Complaint Chest Pain   HPI Yvette Gibbs is a 26 y.o. female with PMH of depression, DM, obesity, and OSA presents to the emergency department for evaluation of chest pain. Pain began at 9 AM this morning with numbness and pain in the left arm. Patient states that the sensation radiated into her left upper abdomen and left back. She states she had a similar sensation when she had pneumonia. No fevers, chills, cough, difficulty breathing. She has not taken any medications at home to improve her symptoms. She denies any vomiting or diarrhea. No vaginal bleeding or discharge. Denies difficulty walking. She has had significant increased thirst and states she was told she is diabetic but she does not believe them. No modifying factors. Patient reports that her CP feels similar to prior GERD.    Past Medical History:  Diagnosis Date  . Asthma   . Depression   . Diabetes mellitus type 2, uncontrolled (HCC)   . Extreme obesity (HCC)   . Family history of adverse reaction to anesthesia    " my grandmother had an allergic reaction  . Mental disorder   . OSA (obstructive sleep apnea)     Patient Active Problem List   Diagnosis Date Noted  . HCAP (healthcare-associated pneumonia) 09/07/2015  . Sepsis (HCC) 09/07/2015  . Asthma 09/07/2015  . Pain in the chest   . Dyspnea 09/01/2015  . Pleuritic chest pain 09/01/2015  . Morbid obesity (HCC) 09/01/2015  . Asthma exacerbation 09/01/2015  . OSA (obstructive sleep apnea) 09/01/2015  . Influenza A 09/01/2015  . Extreme obesity (HCC) 09/06/2014  . Diabetes mellitus type 2, uncontrolled (HCC) 09/06/2014  . Biliary colic 09/04/2014    Past Surgical History:  Procedure Laterality Date  . APPENDECTOMY    . CHOLECYSTECTOMY N/A 09/05/2014   Procedure: LAPAROSCOPIC CHOLECYSTECTOMY;  Surgeon: Axel Filler, MD;  Location:  [facility] OR;  Service: General;  Laterality: N/A;  . TONSILLECTOMY      Current Outpatient Rx  . Order #: [id number] Class: Historical Med  . Order #: [id number] Class: Print  . Order #: [id number] Class: Print  . Order #: [id number] Class: Print  . Order #: [id number] Class: Print  . Order #: [id number] Class: Print  . Order #: [id number] Class: Historical Med  . Order #: [id number] Class: Print  . Order #: [id number] Class: Print    Allergies Pollen extract and Fish allergy  Family History  Problem Relation Age of Onset  . Diabetes Mellitus II Maternal Grandmother     Social History Social History  Substance Use Topics  . Smoking status: Never Smoker  . Smokeless tobacco: Never Used  . Alcohol use No    Review of Systems  Constitutional: No fever/chills Eyes: No visual changes. ENT: No sore throat. Cardiovascular: Positive for chest pain. Respiratory: Denies shortness of breath. Gastrointestinal: Positive LUQ abdominal pain.  No nausea, no vomiting.  No diarrhea.  No constipation. Genitourinary: Negative for dysuria. Musculoskeletal: Negative for back pain. Skin: Negative for rash. Neurological: Negative for headaches, focal weakness. LUE pain/numbness (resolved).   10-point ROS otherwise negative.  ____________________________________________   PHYSICAL EXAM:  VITAL SIGNS: ED Triage Vitals  Enc Vitals Group     BP 09/23/16 1519 (!) 162/102     Pulse Rate 09/23/16 1519 79     Resp 09/23/16 1519 18     Temp 09/23/16 1519 98.6 F (37 C)  Temp Source 09/23/16 1519 Oral     SpO2 09/23/16 1514 95 %     Weight --      Height 09/23/16 1520  (1.575 m)     Pain Score 09/23/16 1519 10   Constitutional: Alert and oriented. Well appearing and in no acute distress. Eyes: Conjunctivae are normal. Head: Atraumatic. Nose: No congestion/rhinnorhea. Mouth/Throat: Mucous membranes are moist.  Oropharynx non-erythematous. Neck: No stridor.   Cardiovascular: Normal rate, regular  rhythm. Good peripheral circulation. Grossly normal heart sounds.   Respiratory: Normal respiratory effort.  No retractions. Lungs CTAB.  Gastrointestinal: Soft and nontender. No distention.  Musculoskeletal: No lower extremity tenderness nor edema. No gross deformities of extremities.  Neurologic:  Normal speech and language. No gross focal neurologic deficits are appreciated.  Skin:  Skin is warm, dry and intact. No rash noted.  ____________________________________________   LABS (all labs ordered are listed, but only abnormal results are displayed)  Labs Reviewed  COMPREHENSIVE METABOLIC PANEL - Abnormal; Notable for the following:       Result Value   Glucose, Bld 109 (*)    All other components within normal limits  CBC WITH DIFFERENTIAL/PLATELET  I-STAT TROPOININ, ED  I-STAT BETA HCG BLOOD, ED ([facility], [facility], [facility] ONLY)   ____________________________________________  EKG   EKG Interpretation  Date/Time:  Wednesday September 23 2016 15:31:34 EDT Ventricular Rate:  82 PR Interval:    QRS Duration: 117 QT Interval:  419 QTC Calculation: 490 R Axis:   0 Text Interpretation:  Sinus rhythm Incomplete right bundle branch block ST elev, probable normal early repol pattern No STEMI.  Confirmed by Karl Knarr MD, Katlynne Mckercher [phone]) on 09/23/2016 3:35:04 PM       ____________________________________________  RADIOLOGY  Dg Chest 2 View  Result Date: 09/23/2016 CLINICAL DATA:  26 y/o F; left-sided chest pain, cough, shortness of breath, fever. EXAM: CHEST  2 VIEW COMPARISON:  04/17/2016 chest radiograph FINDINGS: Stable heart size and mediastinal contours are within normal limits. Both lungs are clear. Mild S-shaped curvature of the spine. IMPRESSION: No active cardiopulmonary disease. Electronically Signed   By: Mitzi Hansen M.D.   On: 09/23/2016 16:04    ____________________________________________   PROCEDURES  Procedure(s) performed:    Procedures  None ____________________________________________   INITIAL IMPRESSION / ASSESSMENT AND PLAN / ED COURSE  Pertinent labs & imaging results that were available during my care of the patient were reviewed by me and considered in my medical decision making (see chart for details).  Patient resents to the emergency department for evaluation of chest pain. She's had multiple ED presentations for chest pain in the past. States this feels similar to her GERD but also to her prior episodes of flu and pneumonia. No hypoxemia. No focal neurological deficits on my exam. Lower suspicion for pneumonia. Very low suspicion for central process to explain her tingling sensation in the arm as migrating to the left abdomen and into the lower back.   Heart enzymes, EKG, and imaging are all negative. Plan for PCP follow up.   At this time, I do not feel there is any life-threatening condition present. I have reviewed and discussed all results (EKG, imaging, lab, urine as appropriate), exam findings with patient. I have reviewed nursing notes and appropriate previous records.  I feel the patient is safe to be discharged home without further emergent workup. Discussed usual and customary return precautions. Patient and family (if present) verbalize understanding and are comfortable with this plan.  Patient will follow-up  with their primary care provider. If they do not have a primary care provider, information for follow-up has been provided to them. All questions have been answered.  ____________________________________________  FINAL CLINICAL IMPRESSION(S) / ED DIAGNOSES  Final diagnoses:  Precordial chest pain  Epigastric abdominal pain     MEDICATIONS GIVEN DURING THIS VISIT:  Medications  gi cocktail (Maalox,Lidocaine,Donnatal) (30 mLs Oral Given 09/23/16 1615)     NEW OUTPATIENT MEDICATIONS STARTED DURING THIS VISIT:  Discharge Medication List as of 09/23/2016  5:54 PM    START  taking these medications   Details  dicyclomine (BENTYL) 20 MG tablet Take 1 tablet (20 mg total) by mouth 2 (two) times daily., Starting Wed 09/23/2016, Print    omeprazole (PRILOSEC) 20 MG capsule Take 1 capsule (20 mg total) by mouth daily., Starting Wed 09/23/2016, Print    sucralfate (CARAFATE) 1 GM/10ML suspension Take 10 mLs (1 g total) by mouth 4 (four) times daily -  with meals and at bedtime., Starting Wed 09/23/2016, Print         Note:  This document was prepared using Dragon voice recognition software and may include unintentional dictation errors.  Alona Bene, MD Emergency Medicine   Maia Plan, MD 09/24/16 0001

## 2016-10-27 ENCOUNTER — Inpatient Hospital Stay (HOSPITAL_COMMUNITY)
Admission: AD | Admit: 2016-10-27 | Discharge: 2016-10-27 | Disposition: A | Discharge status: Home / Self Care | Payer: Medicaid Other | Source: Ambulatory Visit | Attending: Obstetrics and Gynecology | Admitting: Obstetrics and Gynecology

## 2016-10-27 ENCOUNTER — Encounter (HOSPITAL_COMMUNITY): Payer: Self-pay | Admitting: *Deleted

## 2016-10-27 DIAGNOSIS — N921 Excessive and frequent menstruation with irregular cycle: Secondary | ICD-10-CM | POA: Diagnosis not present

## 2016-10-27 DIAGNOSIS — Z9889 Other specified postprocedural states: Secondary | ICD-10-CM | POA: Diagnosis not present

## 2016-10-27 DIAGNOSIS — J45909 Unspecified asthma, uncomplicated: Secondary | ICD-10-CM | POA: Insufficient documentation

## 2016-10-27 DIAGNOSIS — Z3202 Encounter for pregnancy test, result negative: Secondary | ICD-10-CM | POA: Insufficient documentation

## 2016-10-27 DIAGNOSIS — M545 Low back pain: Secondary | ICD-10-CM | POA: Diagnosis not present

## 2016-10-27 DIAGNOSIS — A599 Trichomoniasis, unspecified: Secondary | ICD-10-CM | POA: Diagnosis not present

## 2016-10-27 DIAGNOSIS — F329 Major depressive disorder, single episode, unspecified: Secondary | ICD-10-CM | POA: Insufficient documentation

## 2016-10-27 DIAGNOSIS — E1165 Type 2 diabetes mellitus with hyperglycemia: Secondary | ICD-10-CM | POA: Diagnosis not present

## 2016-10-27 DIAGNOSIS — N939 Abnormal uterine and vaginal bleeding, unspecified: Secondary | ICD-10-CM

## 2016-10-27 DIAGNOSIS — G4733 Obstructive sleep apnea (adult) (pediatric): Secondary | ICD-10-CM | POA: Insufficient documentation

## 2016-10-27 DIAGNOSIS — E669 Obesity, unspecified: Secondary | ICD-10-CM | POA: Insufficient documentation

## 2016-10-27 DIAGNOSIS — Z9049 Acquired absence of other specified parts of digestive tract: Secondary | ICD-10-CM | POA: Insufficient documentation

## 2016-10-27 DIAGNOSIS — Z833 Family history of diabetes mellitus: Secondary | ICD-10-CM | POA: Insufficient documentation

## 2016-10-27 DIAGNOSIS — Z91013 Allergy to seafood: Secondary | ICD-10-CM | POA: Diagnosis not present

## 2016-10-27 LAB — URINALYSIS, MICROSCOPIC (REFLEX): WBC, UA: NONE SEEN WBC/hpf (ref 0–5)

## 2016-10-27 LAB — URINALYSIS, ROUTINE W REFLEX MICROSCOPIC

## 2016-10-27 LAB — WET PREP, GENITAL
Clue Cells Wet Prep HPF POC: NONE SEEN
Sperm: NONE SEEN
Yeast Wet Prep HPF POC: NONE SEEN

## 2016-10-27 LAB — POCT PREGNANCY, URINE: Preg Test, Ur: NEGATIVE

## 2016-10-27 MED ORDER — METRONIDAZOLE 500 MG PO TABS: 2000.0000 mg | ORAL_TABLET | Freq: Once | ORAL | 0 refills | Status: AC

## 2016-10-27 NOTE — MAU Note (Signed)
Pt presents to MAU with complaints of lower abdominal cramping. Pt states home pregnancy test was negative. Denies any VB or abnormal discharge. LMP 07/22/16

## 2016-10-27 NOTE — MAU Provider Note (Signed)
History     CSN: [id number]  Arrival date and time: 10/27/16 1444   First Provider Initiated Contact with Patient 10/27/16 1553      Chief Complaint  Patient presents with  . Metrorrhagia  . Possible Pregnancy   HPI Ms. Yvette Gibbs is a 26 y.o. female presents to MAU today with complaint of irregular periods and concern for pregnancy. The patient states LMP was in February. She has noted spotting at times, but no regular bleeding. She states that her breasts are sore and she has had low back pain. She states that her and her partner are trying to conceive, but also sometimes using condoms. She is not on birth control. She denies UTI symptoms or fever.   OB History    No data available      Past Medical History:  Diagnosis Date  . Asthma   . Depression   . Diabetes mellitus type 2, uncontrolled (HCC)   . Extreme obesity   . Family history of adverse reaction to anesthesia    " my grandmother had an allergic reaction  . Mental disorder   . OSA (obstructive sleep apnea)     Past Surgical History:  Procedure Laterality Date  . APPENDECTOMY    . CHOLECYSTECTOMY N/A 09/05/2014   Procedure: LAPAROSCOPIC CHOLECYSTECTOMY;  Surgeon: Axel Filler, MD;  Location: [facility] OR;  Service: General;  Laterality: N/A;  . TONSILLECTOMY      Family History  Problem Relation Age of Onset  . Diabetes Mellitus II Maternal Grandmother     Social History  Substance Use Topics  . Smoking status: Never Smoker  . Smokeless tobacco: Never Used  . Alcohol use No    Allergies:  Allergies  Allergen Reactions  . Fish Allergy Itching and Rash    No prescriptions prior to admission.    Review of Systems  Constitutional: Negative for fever.  Gastrointestinal: Positive for abdominal pain. Negative for constipation, diarrhea, nausea and vomiting.  Genitourinary: Positive for vaginal bleeding. Negative for dysuria, frequency, urgency and vaginal discharge.  Musculoskeletal: Positive for  back pain.   Physical Exam   Blood pressure (!) 140/101, pulse 80, temperature 98.3 F (36.8 C), resp. rate 18, height 5\' 2"  (1.575 m), weight (!) 351 lb (159.2 kg), last menstrual period 07/22/2016, SpO2 99 %.  Physical Exam  Nursing note and vitals reviewed. Constitutional: She is oriented to person, place, and time. She appears well-developed and well-nourished. No distress.  HENT:  Head: Normocephalic and atraumatic.  Cardiovascular: Normal rate.   Respiratory: Effort normal.  GI: Soft. She exhibits no distension and no mass. There is tenderness (mild lower). There is no rebound and no guarding.  Genitourinary: Cervix exhibits no motion tenderness, no discharge and no friability. There is bleeding in the vagina. No vaginal discharge found.  Neurological: She is alert and oriented to person, place, and time.  Skin: Skin is warm and dry. No erythema.  Psychiatric: She has a normal mood and affect.    Results for orders placed or performed during the hospital encounter of 10/27/16 (from the past 24 hour(s))  Urinalysis, Routine w reflex microscopic     Status: Abnormal   Collection Time: 10/27/16  3:00 PM  Result Value Ref Range   Color, Urine RED (A) YELLOW   APPearance TURBID (A) CLEAR   Specific Gravity, Urine  1.005 - 1.030    TEST NOT REPORTED DUE TO COLOR INTERFERENCE OF URINE PIGMENT   pH  5.0 - 8.0  TEST NOT REPORTED DUE TO COLOR INTERFERENCE OF URINE PIGMENT   Glucose, UA (A) NEGATIVE mg/dL    TEST NOT REPORTED DUE TO COLOR INTERFERENCE OF URINE PIGMENT   Hgb urine dipstick (A) NEGATIVE    TEST NOT REPORTED DUE TO COLOR INTERFERENCE OF URINE PIGMENT   Bilirubin Urine (A) NEGATIVE    TEST NOT REPORTED DUE TO COLOR INTERFERENCE OF URINE PIGMENT   Ketones, ur (A) NEGATIVE mg/dL    TEST NOT REPORTED DUE TO COLOR INTERFERENCE OF URINE PIGMENT   Protein, ur (A) NEGATIVE mg/dL    TEST NOT REPORTED DUE TO COLOR INTERFERENCE OF URINE PIGMENT   Nitrite (A) NEGATIVE    TEST  NOT REPORTED DUE TO COLOR INTERFERENCE OF URINE PIGMENT   Leukocytes, UA (A) NEGATIVE    TEST NOT REPORTED DUE TO COLOR INTERFERENCE OF URINE PIGMENT  Urinalysis, Microscopic (reflex)     Status: Abnormal   Collection Time: 10/27/16  3:00 PM  Result Value Ref Range   RBC / HPF TOO NUMEROUS TO COUNT 0 - 5 RBC/hpf   WBC, UA NONE SEEN 0 - 5 WBC/hpf   Bacteria, UA FEW (A) NONE SEEN   Squamous Epithelial / LPF 0-5 (A) NONE SEEN   Trichomonas, UA PRESENT   Pregnancy, urine POC     Status: None   Collection Time: 10/27/16  3:13 PM  Result Value Ref Range   Preg Test, Ur NEGATIVE NEGATIVE  Wet prep, genital     Status: Abnormal   Collection Time: 10/27/16  4:21 PM  Result Value Ref Range   Yeast Wet Prep HPF POC NONE SEEN NONE SEEN   Trich, Wet Prep PRESENT (A) NONE SEEN   Clue Cells Wet Prep HPF POC NONE SEEN NONE SEEN   WBC, Wet Prep HPF POC FEW (A) NONE SEEN   Sperm NONE SEEN     MAU Course  Procedures None  MDM UPT - negative UA, wet prep and GC/Chlamydia today   Assessment and Plan  A: Irregular periods, likely PCOS Trichomonas  P: Discharge home Rx for Flagyl given to patient  Partner treatment advised Warning signs for worsening condition discussed Patient advised to follow-up with [facility] as needed for routine GYN care Patient may return to MAU as needed or if her condition were to change or worsen   Vonzella NippleJulie Doloros Kwolek, PA-C 10/27/2016, 6:42 PM

## 2016-10-27 NOTE — Discharge Instructions (Signed)
Abnormal Uterine Bleeding Abnormal uterine bleeding means bleeding more than usual from your uterus. It can include:  Bleeding between periods.  Bleeding after sex.  Bleeding that is heavier than normal.  Periods that last longer than usual.  Bleeding after you have stopped having your period (menopause). There are many problems that may cause this. You should see a doctor for any kind of bleeding that is not normal. Treatment depends on the cause of the bleeding. Follow these instructions at home:  Watch your condition for any changes.  Do not use tampons, douche, or have sex, if your doctor tells you not to.  Change your pads often.  Get regular well-woman exams. Make sure they include a pelvic exam and cervical cancer screening.  Keep all follow-up visits as told by your doctor. This is important. Contact a doctor if:  The bleeding lasts more than one week.  You feel dizzy at times.  You feel like you are going to throw up (nauseous).  You throw up. Get help right away if:  You pass out.  You have to change pads every hour.  You have belly (abdominal) pain.  You have a fever.  You get sweaty.  You get weak.  You passing large blood clots from your vagina. Summary  Abnormal uterine bleeding means bleeding more than usual from your uterus.  There are many problems that may cause this. You should see a doctor for any kind of bleeding that is not normal.  Treatment depends on the cause of the bleeding. This information is not intended to replace advice given to you by your health care provider. Make sure you discuss any questions you have with your health care provider. Document Released: 03/29/2009 Document Revised: 05/26/2016 Document Reviewed: 05/26/2016 Elsevier Interactive Patient Education  2017 [employer] Trichomonas Test Why am I having this test? The trichomonas test is done to diagnose trichomoniasis, an infection caused by an organism called  Trichomonas. Trichomoniasis is a sexually transmitted infection (STI). In women, it causes vaginal infections. In men, it can cause the tube that carries urine (urethra) to become inflamed (urethritis). You may have this test as a part of a routine screening for STIs or if you have symptoms of trichomoniasis. To perform the test, your health care provider will take a sample of discharge. The sample is taken from the vagina or cervix in women and from the urethra in men. A urine sample can also be used for testing. What do the results mean? It is your responsibility to obtain your test results. Ask the lab or department performing the test when and how you will get your results. Contact your health care provider to discuss any questions you have about your results. Negative test results  A negative test means you do not have trichomoniasis. Follow your health care provider's directions about any follow-up testing. Positive test results  A positive test result means you have an active infection that needs to be treated with antibiotic medicine. All your current sexual partners must also be treated or it is likely you will get reinfected. If your test is positive, your health care provider will start you on medicine and may advise you to:  Not have sexual intercourse until your infection has cleared up.  Use a latex condom properly every time you have sexual intercourse.  Limit the number of sexual partners you have. The more partners you have, the greater your risk of contracting trichomoniasis or another STI.  Tell all sexual  partners about your infection so they can also be treated and to prevent reinfection. Talk with your health care provider to discuss your results, treatment options, and if necessary, the need for more tests. Talk with your health care provider if you have any questions about your results. This information is not intended to replace advice given to you by your health care  provider. Make sure you discuss any questions you have with your health care provider. Document Released: 07/04/2004 Document Revised: 01/01/2016 Document Reviewed: 06/13/2013 Elsevier Interactive Patient Education  2017 [employer].

## 2016-10-28 LAB — GC/CHLAMYDIA PROBE AMP (~~LOC~~) NOT AT ARMC
Chlamydia: NEGATIVE
Neisseria Gonorrhea: NEGATIVE

## 2017-01-11 ENCOUNTER — Emergency Department (HOSPITAL_COMMUNITY): Payer: Medicaid Other

## 2017-01-11 ENCOUNTER — Encounter (HOSPITAL_COMMUNITY): Payer: Self-pay | Admitting: Emergency Medicine

## 2017-01-11 DIAGNOSIS — M940 Chondrocostal junction syndrome [Tietze]: Secondary | ICD-10-CM | POA: Diagnosis not present

## 2017-01-11 DIAGNOSIS — R079 Chest pain, unspecified: Secondary | ICD-10-CM | POA: Diagnosis present

## 2017-01-11 DIAGNOSIS — J45909 Unspecified asthma, uncomplicated: Secondary | ICD-10-CM | POA: Insufficient documentation

## 2017-01-11 DIAGNOSIS — Z79899 Other long term (current) drug therapy: Secondary | ICD-10-CM | POA: Insufficient documentation

## 2017-01-11 DIAGNOSIS — E119 Type 2 diabetes mellitus without complications: Secondary | ICD-10-CM | POA: Insufficient documentation

## 2017-01-11 DIAGNOSIS — J069 Acute upper respiratory infection, unspecified: Secondary | ICD-10-CM | POA: Diagnosis not present

## 2017-01-11 LAB — CBC
HCT: 38.7 % (ref 36.0–46.0)
Hemoglobin: 12.4 g/dL (ref 12.0–15.0)
MCH: 27.6 pg (ref 26.0–34.0)
MCHC: 32 g/dL (ref 30.0–36.0)
MCV: 86.2 fL (ref 78.0–100.0)
Platelets: 274 10*3/uL (ref 150–400)
RBC: 4.49 MIL/uL (ref 3.87–5.11)
RDW: 13.3 % (ref 11.5–15.5)
WBC: 5.7 10*3/uL (ref 4.0–10.5)

## 2017-01-11 LAB — BASIC METABOLIC PANEL
Anion gap: 8 (ref 5–15)
BUN: 8 mg/dL (ref 6–20)
CO2: 28 mmol/L (ref 22–32)
Calcium: 9.6 mg/dL (ref 8.9–10.3)
Chloride: 101 mmol/L (ref 101–111)
Creatinine, Ser: 0.73 mg/dL (ref 0.44–1.00)
GFR calc Af Amer: >60 mL/min (ref >60)
GFR calc non Af Amer: >60 mL/min (ref >60)
Glucose, Bld: 97 mg/dL (ref 65–99)
Potassium: 4 mmol/L (ref 3.5–5.1)
Sodium: 137 mmol/L (ref 135–145)

## 2017-01-11 LAB — I-STAT TROPONIN, ED: Troponin i, poc: 0 ng/mL (ref 0.00–0.08)

## 2017-01-11 NOTE — ED Notes (Signed)
Pt up to desk requesting update, provided answers to questions and apologized for wait.

## 2017-01-11 NOTE — ED Triage Notes (Signed)
Patient reports intermittent central chest pain/heaviness with SOB and productive cough for 3 days , denies emesis or diaphoresis .

## 2017-01-12 ENCOUNTER — Emergency Department (HOSPITAL_COMMUNITY)
Admission: EM | Admit: 2017-01-12 | Discharge: 2017-01-12 | Disposition: A | Discharge status: Home / Self Care | Payer: Medicaid Other | Attending: Emergency Medicine | Admitting: Emergency Medicine

## 2017-01-12 DIAGNOSIS — J069 Acute upper respiratory infection, unspecified: Secondary | ICD-10-CM

## 2017-01-12 DIAGNOSIS — M940 Chondrocostal junction syndrome [Tietze]: Secondary | ICD-10-CM

## 2017-01-12 HISTORY — DX: Obesity, unspecified: E66.9

## 2017-01-12 LAB — I-STAT TROPONIN, ED: Troponin i, poc: 0 ng/mL (ref 0.00–0.08)

## 2017-01-12 MED ORDER — IBUPROFEN 400 MG PO TABS
600.0000 mg | ORAL_TABLET | Freq: Once | ORAL | Status: AC
  Administered 2017-01-12: 600 mg via ORAL
  Filled 2017-01-12: qty 1

## 2017-01-12 NOTE — Discharge Instructions (Signed)
Use ice and heat for comfort for your chest pain.  Take ibuprofen 600 mg 4 times a day for pain.  Recheck if you get a fever, get wheezing, or you are still coughing in another week.

## 2017-01-12 NOTE — ED Notes (Signed)
Pt departed in NAD, refused use of wheelchair.  

## 2017-01-12 NOTE — ED Provider Notes (Signed)
[facility]-EMERGENCY DEPT Provider Note   CSN: [id number] Arrival date & time: 01/11/17  2038  By signing my name below, I, Diona Browner, attest that this documentation has been prepared under the direction and in the presence of Devoria Albe, MD. Electronically Signed: Diona Browner, ED Scribe. 01/12/17. 3:09 AM.  TIME SEEN: 0302  History   Chief Complaint Chief Complaint  Patient presents with  . Chest Pain    HPI Yvette Gibbs is a 26 y.o. female with a PMHx of asthma, and DM who presents to the Emergency Department complaining of constant, sharp, upper central and upper left sided chest pain that started ~ 3 days ago. Associated sx include productive cough with green sputum, SOB, chills, sore throat, sneezing without rhinorrhea, "mild wheezing",  diarrhea over 2 times a day for 2 weeks, and RLQ abdominal pain. Movement exacerbates her discomfort. Pt took 2 OTC ibuprofen without relief. She has used an inhaler with mild relief of her wheezing. She doesn't smoke or drink. She is currently in school to earn her GED. She is not currently on any other medication. Pt denies fever, emesis, and diaphoresis.   PCP: None  The history is provided by the patient. No language interpreter was used.    Past Medical History:  Diagnosis Date  . Asthma   . Depression   . Diabetes mellitus type 2, uncontrolled (HCC)   . Extreme obesity   . Family history of adverse reaction to anesthesia    " my grandmother had an allergic reaction  . Mental disorder   . Obesity   . OSA (obstructive sleep apnea)     Patient Active Problem List   Diagnosis Date Noted  . HCAP (healthcare-associated pneumonia) 09/07/2015  . Sepsis (HCC) 09/07/2015  . Asthma 09/07/2015  . Pain in the chest   . Dyspnea 09/01/2015  . Pleuritic chest pain 09/01/2015  . Morbid obesity (HCC) 09/01/2015  . Asthma exacerbation 09/01/2015  . OSA (obstructive sleep apnea) 09/01/2015  . Influenza A 09/01/2015  . Extreme obesity  09/06/2014  . Diabetes mellitus type 2, uncontrolled (HCC) 09/06/2014  . Biliary colic 09/04/2014    Past Surgical History:  Procedure Laterality Date  . APPENDECTOMY    . CHOLECYSTECTOMY N/A 09/05/2014   Procedure: LAPAROSCOPIC CHOLECYSTECTOMY;  Surgeon: Axel Filler, MD;  Location: [facility] OR;  Service: General;  Laterality: N/A;  . TONSILLECTOMY      OB History    No data available       Home Medications    Prior to Admission medications   Medication Sig Start Date End Date Taking? Authorizing Provider  albuterol (PROVENTIL HFA;VENTOLIN HFA) 108 (90 Base) MCG/ACT inhaler Inhale 1-2 puffs into the lungs every 6 (six) hours as needed for wheezing or shortness of breath.    [provider]  ibuprofen (ADVIL,MOTRIN) 800 MG tablet Take 1 tablet (800 mg total) by mouth 3 (three) times daily. Patient not taking: Reported on 04/17/2016 01/28/16   Elson Areas, PA-C  omeprazole (PRILOSEC) 20 MG capsule Take 1 capsule (20 mg total) by mouth daily. Patient not taking: Reported on 10/27/2016 09/23/16   Long, Arlyss Repress, MD  sucralfate (CARAFATE) 1 GM/10ML suspension Take 10 mLs (1 g total) by mouth 4 (four) times daily -  with meals and at bedtime. Patient not taking: Reported on 10/27/2016 09/23/16   Long, Arlyss Repress, MD    Family History Family History  Problem Relation Age of Onset  . Diabetes Mellitus II Maternal Grandmother  Social History Social History  Substance Use Topics  . Smoking status: Never Smoker  . Smokeless tobacco: Never Used  . Alcohol use No  studying for her GED   Allergies   Fish allergy   Review of Systems Review of Systems  Constitutional: Positive for chills. Negative for diaphoresis and fever.  HENT: Positive for rhinorrhea, sneezing and sore throat.   Respiratory: Positive for cough and shortness of breath.   Cardiovascular: Positive for chest pain.  Gastrointestinal: Positive for abdominal pain and diarrhea. Negative for vomiting.  All  other systems reviewed and are negative.    Physical Exam Updated Vital Signs BP (!) 149/92 (BP Location: Right Arm)   Pulse 83   Temp 98.9 F (37.2 C) (Oral)   Resp 20   Ht 5\' 2"  (1.575 m)   Wt (!) 341 lb 5 oz (154.8 kg)   LMP 01/07/2017 (Approximate)   SpO2 100%   BMI 62.43 kg/m   Vital signs normal    Physical Exam  Constitutional: She is oriented to person, place, and time. She appears well-developed and well-nourished.  Non-toxic appearance. She does not appear ill. No distress.  Obese.   HENT:  Head: Normocephalic and atraumatic.  Right Ear: External ear normal.  Left Ear: External ear normal.  Nose: Nose normal. No mucosal edema or rhinorrhea.  Mouth/Throat: Oropharynx is clear and moist and mucous membranes are normal. No dental abscesses or uvula swelling.  Eyes: Pupils are equal, round, and reactive to light. Conjunctivae and EOM are normal.  Neck: Normal range of motion and full passive range of motion without pain. Neck supple.  Cardiovascular: Normal rate, regular rhythm and normal heart sounds.  Exam reveals no gallop and no friction rub.   No murmur heard. Pulmonary/Chest: Effort normal. No respiratory distress. She has decreased breath sounds. She has no wheezes. She has no rhonchi. She has no rales. She exhibits no tenderness and no crepitus.    Chest wall thickness limits hearing breath sounds. Left sided tenderness to upper costochondral junction.   Abdominal: Soft. Normal appearance and bowel sounds are normal. She exhibits no distension. There is no tenderness. There is no rebound and no guarding.  Musculoskeletal: Normal range of motion. She exhibits no edema or tenderness.  Moves all extremities well.   Neurological: She is alert and oriented to person, place, and time. She has normal strength. No cranial nerve deficit.  Skin: Skin is warm, dry and intact. No rash noted. No erythema. No pallor.  Psychiatric: She has a normal mood and affect. Her  speech is normal and behavior is normal. Her mood appears not anxious.  Nursing note and vitals reviewed.    ED Treatments / Results  Labs (all labs ordered are listed, but only abnormal results are displayed) Results for orders placed or performed during the hospital encounter of 01/12/17  Basic metabolic panel  Result Value Ref Range   Sodium 137 135 - 145 mmol/L   Potassium 4.0 3.5 - 5.1 mmol/L   Chloride 101 101 - 111 mmol/L   CO2 28 22 - 32 mmol/L   Glucose, Bld 97 65 - 99 mg/dL   BUN 8 6 - 20 mg/dL   Creatinine, Ser 1.610.73 0.44 - 1.00 mg/dL   Calcium 9.6 8.9 - 09.610.3 mg/dL   GFR calc non Af Amer >60 >60 mL/min   GFR calc Af Amer >60 >60 mL/min   Anion gap 8 5 - 15  CBC  Result Value Ref Range  WBC 5.7 4.0 - 10.5 K/uL   RBC 4.49 3.87 - 5.11 MIL/uL   Hemoglobin 12.4 12.0 - 15.0 g/dL   HCT 16.138.7 09.636.0 - 04.546.0 %   MCV 86.2 78.0 - 100.0 fL   MCH 27.6 26.0 - 34.0 pg   MCHC 32.0 30.0 - 36.0 g/dL   RDW 40.913.3 81.111.5 - 91.415.5 %   Platelets 274 150 - 400 K/uL  I-stat troponin, ED  Result Value Ref Range   Troponin i, poc 0.00 0.00 - 0.08 ng/mL   Comment 3          I-stat troponin, ED  Result Value Ref Range   Troponin i, poc 0.00 0.00 - 0.08 ng/mL   Comment 3           Laboratory interpretation all normal     EKG  EKG Interpretation  Date/Time:  Monday January 11 2017 20:45:34 EDT Ventricular Rate:  88 PR Interval:  184 QRS Duration: 120 QT Interval:  404 QTC Calculation: 488 R Axis:   -17 Text Interpretation:  Normal sinus rhythm Right bundle branch block Early repolarization pattern No significant change since last tracing 23 Sep 2016 Confirmed by Devoria AlbeKnapp, Ladena Jacquez ([id number]) on 01/12/2017 4:03:43 AM       Radiology Dg Chest 2 View  Result Date: 01/11/2017 CLINICAL DATA:  26 year old female with fever cough and shortness of breath. EXAM: CHEST  2 VIEW COMPARISON:  Chest radiograph dated 09/23/2016 FINDINGS: The heart size and mediastinal contours are within normal limits. Both  lungs are clear. The visualized skeletal structures are unremarkable. IMPRESSION: No active cardiopulmonary disease. Electronically Signed   By: Elgie CollardArash  Radparvar M.D.   On: 01/11/2017 21:24    Procedures Procedures (including critical care time)  Medications Ordered in ED Medications  ibuprofen (ADVIL,MOTRIN) tablet 600 mg (not administered)     Initial Impression / Assessment and Plan / ED Course  I have reviewed the triage vital signs and the nursing notes.  Pertinent labs & imaging results that were available during my care of the patient were reviewed by me and considered in my medical decision making (see chart for details).     DIAGNOSTIC STUDIES: Oxygen Saturation is 99% on RA, normal by my interpretation.   COORDINATION OF CARE: 3:09 AM-Discussed next steps with pt which includes taking motrin for pain. Pt verbalized understanding and is agreeable with the plan. Patient's pain is most consistent with chest wall pain or costochondritis. She was offered a prescription for another inhaler which she refused. At the time of my exam the computer was down however I went back when the computer was back up to discuss her test results.    Final Clinical Impressions(s) / ED Diagnoses   Final diagnoses:  Costochondritis  Viral upper respiratory tract infection    New Prescriptions OTC ibuprofen  Plan discharge  Devoria AlbeIva Promise Bushong, MD, FACEP  I personally performed the services described in this documentation, which was scribed in my presence. The recorded information has been reviewed and considered.  Devoria AlbeIva Mirren Gest, MD, FACEP    Anatomy   .Lorelee Marketil   Antonia Culbertson, MD 01/12/17 [phone]

## 2017-02-26 ENCOUNTER — Emergency Department (HOSPITAL_COMMUNITY)
Admission: EM | Admit: 2017-02-26 | Discharge: 2017-02-26 | Disposition: A | Discharge status: Home / Self Care | Payer: Medicaid Other | Attending: Emergency Medicine | Admitting: Emergency Medicine

## 2017-02-26 ENCOUNTER — Encounter (HOSPITAL_COMMUNITY): Payer: Self-pay | Admitting: *Deleted

## 2017-02-26 ENCOUNTER — Emergency Department (HOSPITAL_COMMUNITY): Payer: Medicaid Other

## 2017-02-26 DIAGNOSIS — R079 Chest pain, unspecified: Secondary | ICD-10-CM | POA: Diagnosis present

## 2017-02-26 DIAGNOSIS — R51 Headache: Secondary | ICD-10-CM | POA: Insufficient documentation

## 2017-02-26 DIAGNOSIS — R0789 Other chest pain: Secondary | ICD-10-CM | POA: Insufficient documentation

## 2017-02-26 DIAGNOSIS — E119 Type 2 diabetes mellitus without complications: Secondary | ICD-10-CM | POA: Insufficient documentation

## 2017-02-26 DIAGNOSIS — R519 Headache, unspecified: Secondary | ICD-10-CM

## 2017-02-26 DIAGNOSIS — J45909 Unspecified asthma, uncomplicated: Secondary | ICD-10-CM | POA: Diagnosis not present

## 2017-02-26 LAB — BASIC METABOLIC PANEL
Anion gap: 11 (ref 5–15)
BUN: 10 mg/dL (ref 6–20)
CO2: 26 mmol/L (ref 22–32)
Calcium: 9.2 mg/dL (ref 8.9–10.3)
Chloride: 98 mmol/L — ABNORMAL LOW (ref 101–111)
Creatinine, Ser: 0.61 mg/dL (ref 0.44–1.00)
GFR calc Af Amer: >60 mL/min (ref >60)
GFR calc non Af Amer: >60 mL/min (ref >60)
Glucose, Bld: 237 mg/dL — ABNORMAL HIGH (ref 65–99)
Potassium: 3.7 mmol/L (ref 3.5–5.1)
Sodium: 135 mmol/L (ref 135–145)

## 2017-02-26 LAB — CBC
HCT: 39.6 % (ref 36.0–46.0)
Hemoglobin: 12.3 g/dL (ref 12.0–15.0)
MCH: 27.9 pg (ref 26.0–34.0)
MCHC: 31.1 g/dL (ref 30.0–36.0)
MCV: 89.8 fL (ref 78.0–100.0)
Platelets: 251 10*3/uL (ref 150–400)
RBC: 4.41 MIL/uL (ref 3.87–5.11)
RDW: 13.6 % (ref 11.5–15.5)
WBC: 7.5 10*3/uL (ref 4.0–10.5)

## 2017-02-26 LAB — I-STAT TROPONIN, ED: Troponin i, poc: 0 ng/mL (ref 0.00–0.08)

## 2017-02-26 MED ORDER — ONDANSETRON 4 MG PO TBDP: 4.0000 mg | ORAL_TABLET | Freq: Three times a day (TID) | ORAL | 0 refills | Status: DC | PRN

## 2017-02-26 MED ORDER — IBUPROFEN 800 MG PO TABS
800.0000 mg | ORAL_TABLET | Freq: Once | ORAL | Status: AC
  Administered 2017-02-26: 800 mg via ORAL
  Filled 2017-02-26: qty 1

## 2017-02-26 MED ORDER — SODIUM CHLORIDE 0.9 % IV BOLUS (SEPSIS): 500.0000 mL | Freq: Once | INTRAVENOUS | Status: DC

## 2017-02-26 MED ORDER — IBUPROFEN 600 MG PO TABS: 600.0000 mg | ORAL_TABLET | Freq: Four times a day (QID) | ORAL | 0 refills | Status: DC | PRN

## 2017-02-26 MED ORDER — ONDANSETRON HCL 4 MG/2ML IJ SOLN: 4.0000 mg | Freq: Once | INTRAMUSCULAR | Status: DC

## 2017-02-26 MED ORDER — KETOROLAC TROMETHAMINE 30 MG/ML IJ SOLN: 30.0000 mg | Freq: Once | INTRAMUSCULAR | Status: DC

## 2017-02-26 MED ORDER — ACETAMINOPHEN 500 MG PO TABS: 500.0000 mg | ORAL_TABLET | Freq: Four times a day (QID) | ORAL | 0 refills | Status: DC | PRN

## 2017-02-26 NOTE — ED Triage Notes (Signed)
Pt reports ongoing left side chest pain that is sharp and increases with certain movements. reports recent cough, nausea and diarrhea. No acute distress is noted at triage.

## 2017-02-26 NOTE — ED Provider Notes (Signed)
[facility]PT Provider Note   CSN: [id number] Arrival date & time: 02/26/17  1220     History   Chief Complaint Chief Complaint  Patient presents with  . Chest Pain    HPI Yvette Gibbs is a 26 y.o. female with PMHx asthma, depression, DM type II, obesity, OSA who presents today with chief complaint acute onset, constant chest pain for 2 weeks. Pain is constant and described as a sharp pressure primarily localized to the left side of the chest with intermittent radiation to the left side of the body. Patient states that the pain worsened yesterday when she rotated to the left. Also worsens with palpation. Associated symptoms include DOE, nausea, intermittent subjective fever and chills. She has tried "breathing exercises" for her symptoms which were not helpful. She states this feels similarly to the time that she had the flu and pneumonia concurrently. She is also endorsing 3.5 weeks of ongoing frontal headache which radiates to the crown. Denies vision changes, but endorses lightheadedness. Denies numbness, tingling, weakness. No recent trauma or falls. She denies recent travel, surgeries, hemoptysis, estrogen use, or prior history of DVT or PE. No prior history of cancer. She is a nonsmoker, does not use recreational drugs, and does not drink alcohol. The history is provided by the patient.    Past Medical History:  Diagnosis Date  . Asthma   . Depression   . Diabetes mellitus type 2, uncontrolled (HCC)   . Extreme obesity   . Family history of adverse reaction to anesthesia    " my grandmother had an allergic reaction  . Mental disorder   . Obesity   . OSA (obstructive sleep apnea)     Patient Active Problem List   Diagnosis Date Noted  . HCAP (healthcare-associated pneumonia) 09/07/2015  . Sepsis (HCC) 09/07/2015  . Asthma 09/07/2015  . Pain in the chest   . Dyspnea 09/01/2015  . Pleuritic chest pain 09/01/2015  . Morbid obesity (HCC) 09/01/2015  . Asthma  exacerbation 09/01/2015  . OSA (obstructive sleep apnea) 09/01/2015  . Influenza A 09/01/2015  . Extreme obesity 09/06/2014  . Diabetes mellitus type 2, uncontrolled (HCC) 09/06/2014  . Biliary colic 09/04/2014    Past Surgical History:  Procedure Laterality Date  . APPENDECTOMY    . CHOLECYSTECTOMY N/A 09/05/2014   Procedure: LAPAROSCOPIC CHOLECYSTECTOMY;  Surgeon: Axel Filler, MD;  Location: [facility] OR;  Service: General;  Laterality: N/A;  . TONSILLECTOMY      OB History    No data available       Home Medications    Prior to Admission medications   Medication Sig Start Date End Date Taking? Authorizing Provider  albuterol (PROVENTIL HFA;VENTOLIN HFA) 108 (90 Base) MCG/ACT inhaler Inhale 1-2 puffs into the lungs every 6 (six) hours as needed for wheezing or shortness of breath.   Yes [provider]  acetaminophen (TYLENOL) 500 MG tablet Take 1 tablet (500 mg total) by mouth every 6 (six) hours as needed. 02/26/17   Obediah Welles A, PA-C  ibuprofen (ADVIL,MOTRIN) 600 MG tablet Take 1 tablet (600 mg total) by mouth every 6 (six) hours as needed. 02/26/17   Caitlynn Ju A, PA-C  omeprazole (PRILOSEC) 20 MG capsule Take 1 capsule (20 mg total) by mouth daily. Patient not taking: Reported on 10/27/2016 09/23/16   Long, Arlyss Repress, MD  ondansetron (ZOFRAN ODT) 4 MG disintegrating tablet Take 1 tablet (4 mg total) by mouth every 8 (eight) hours as needed for nausea or vomiting. 02/26/17  Aurthur Wingerter A, PA-C  sucralfate (CARAFATE) 1 GM/10ML suspension Take 10 mLs (1 g total) by mouth 4 (four) times daily -  with meals and at bedtime. Patient not taking: Reported on 10/27/2016 09/23/16   Long, Arlyss Repress, MD    Family History Family History  Problem Relation Age of Onset  . Diabetes Mellitus II Maternal Grandmother     Social History Social History  Substance Use Topics  . Smoking status: Never Smoker  . Smokeless tobacco: Never Used  . Alcohol use No     Allergies   Fish  allergy   Review of Systems Review of Systems  Constitutional: Positive for chills, diaphoresis and fever.  Respiratory: Positive for shortness of breath. Negative for cough.   Cardiovascular: Positive for chest pain. Negative for palpitations and leg swelling.  Gastrointestinal: Positive for nausea. Negative for abdominal pain, constipation and vomiting.  Neurological: Positive for light-headedness and headaches. Negative for syncope, weakness and numbness.  All other systems reviewed and are negative.    Physical Exam Updated Vital Signs BP (!) 176/102 (BP Location: Right Arm)   Pulse 100   Temp 99.2 F (37.3 C) (Oral)   Resp 16   LMP 02/08/2017   SpO2 99%   Physical Exam  Constitutional: She appears well-developed and well-nourished. No distress.  HENT:  Head: Normocephalic and atraumatic.  Right Ear: External ear normal.  Left Ear: External ear normal.  Nose: Nose normal.  Mouth/Throat: Oropharynx is clear and moist.  Eyes: Pupils are equal, round, and reactive to light. Conjunctivae and EOM are normal. Right eye exhibits no discharge. Left eye exhibits no discharge.  Neck: Normal range of motion. Neck supple. No JVD present. No tracheal deviation present.  Cardiovascular: Intact distal pulses.   Distant heart sounds due to body habitus, tachycardic, 2+ DP/PT pulses and radial pulses bilaterally, Homan sign absent bilaterally  Pulmonary/Chest: Effort normal.  Hypoactive breath sounds due to body habitus, equal rise and fall of chest, no increased work of breathing. Diffuse left-sided anterior chest wall tenderness to palpation, no paradoxical wall motion or crepitus.  Abdominal: Soft. She exhibits no distension. There is no tenderness.  Musculoskeletal: She exhibits no edema.  Neurological: She is alert. No cranial nerve deficit or sensory deficit. She exhibits normal muscle tone.  Skin: Skin is warm and dry. No erythema.  Psychiatric: She has a normal mood and affect.  Her behavior is normal.  Nursing note and vitals reviewed.    ED Treatments / Results  Labs (all labs ordered are listed, but only abnormal results are displayed) Labs Reviewed  BASIC METABOLIC PANEL - Abnormal; Notable for the following:       Result Value   Chloride 98 (*)    Glucose, Bld 237 (*)    All other components within normal limits  CBC  I-STAT TROPONIN, ED    EKG  EKG Interpretation  Date/Time:  Friday February 26 2017 12:31:49 EDT Ventricular Rate:  112 PR Interval:  158 QRS Duration: 112 QT Interval:  360 QTC Calculation: 491 R Axis:   -38 Text Interpretation:  Sinus tachycardia Left axis deviation Incomplete right bundle branch block Abnormal ECG No significant change since last tracing Confirmed by Drema Pry [phone]) on 02/26/2017 1:30:57 PM       Radiology Dg Chest 2 View  Result Date: 02/26/2017 CLINICAL DATA:  Chest pain for 2 weeks EXAM: CHEST  2 VIEW COMPARISON:  12/15/2016 FINDINGS: Normal heart size. Lungs are under aerated and clear. No pneumothorax  or pleural effusion. IMPRESSION: No active cardiopulmonary disease. Electronically Signed   By: Jolaine Click M.D.   On: 02/26/2017 12:52    Procedures Procedures (including critical care time)  Medications Ordered in ED Medications  ibuprofen (ADVIL,MOTRIN) tablet 800 mg (not administered)     Initial Impression / Assessment and Plan / ED Course  I have reviewed the triage vital signs and the nursing notes.  Pertinent labs & imaging results that were available during my care of the patient were reviewed by me and considered in my medical decision making (see chart for details).     Patient with left-sided chest pain which is reproducible on palpation and has been constant for 2 weeks. Afebrile, initially tachycardic with resolution while in the ED. Her blood pressure is at her baseline and she does not take hypertensive medications. Headache is similar to headaches she has had in the past,  and improved with ibuprofen. She is low risk for PE per Wells' criteria. EKG shows no significant changes from last tracing and does not have evidence of arrhythmia or ST segment abnormalities. Remainder of lab work is reassuring. Chest x-ray shows no active cardiopulmonary disease and I have a low suspicion of pneumonia, pulmonary edema, pericarditis, myocarditis. Troponin is negative. HEART score of 2 and I have low suspicion of ACS or MI contributing to symptoms. Pain is likely musculoskeletal in nature given that it is reproducible on palpation. Discussed use of NSAIDs, heat, ice, and stretching for symptoms. She will follow-up with her primary care physician for reevaluation of her hypertension. Discussed indications for return to the ED. Pain has been managed well in NAD. Patient verbalizes understanding of and agreement with plan and patient is stable for discharge home at this time.   Final Clinical Impressions(s) / ED Diagnoses   Final diagnoses:  Chest wall pain  Bad headache    New Prescriptions New Prescriptions   ACETAMINOPHEN (TYLENOL) 500 MG TABLET    Take 1 tablet (500 mg total) by mouth every 6 (six) hours as needed.   IBUPROFEN (ADVIL,MOTRIN) 600 MG TABLET    Take 1 tablet (600 mg total) by mouth every 6 (six) hours as needed.   ONDANSETRON (ZOFRAN ODT) 4 MG DISINTEGRATING TABLET    Take 1 tablet (4 mg total) by mouth every 8 (eight) hours as needed for nausea or vomiting.     Jeanie Sewer, PA-C 02/26/17 1628    Nira Conn, MD 02/26/17 [phone]

## 2017-02-26 NOTE — Discharge Instructions (Signed)
Alternate 600 mg of ibuprofen and [phone] mg of Tylenol every 3 hours as needed for pain. Do not exceed 4000 mg of Tylenol daily. Apply ice or heat to the affected area for comfort. Do some gentle stretching during hot showers to avoid muscle stiffness. Follow up with your primary care physician for reevaluation of your symptoms and for management of your high blood pressure. Return to the ED immediately if any concerning signs or symptoms develop.

## 2017-03-11 ENCOUNTER — Ambulatory Visit: Payer: Medicaid Other | Admitting: Registered"

## 2017-03-18 ENCOUNTER — Ambulatory Visit: Payer: Medicaid Other | Admitting: Registered"

## 2017-04-18 ENCOUNTER — Emergency Department (HOSPITAL_COMMUNITY): Payer: Self-pay

## 2017-04-18 ENCOUNTER — Other Ambulatory Visit: Payer: Self-pay

## 2017-04-18 ENCOUNTER — Encounter (HOSPITAL_COMMUNITY): Payer: Self-pay | Admitting: Emergency Medicine

## 2017-04-18 ENCOUNTER — Emergency Department (HOSPITAL_COMMUNITY)
Admission: EM | Admit: 2017-04-18 | Discharge: 2017-04-18 | Disposition: A | Discharge status: Home / Self Care | Payer: Self-pay | Attending: Emergency Medicine | Admitting: Emergency Medicine

## 2017-04-18 DIAGNOSIS — R05 Cough: Secondary | ICD-10-CM | POA: Insufficient documentation

## 2017-04-18 DIAGNOSIS — B349 Viral infection, unspecified: Secondary | ICD-10-CM | POA: Insufficient documentation

## 2017-04-18 DIAGNOSIS — R112 Nausea with vomiting, unspecified: Secondary | ICD-10-CM | POA: Insufficient documentation

## 2017-04-18 DIAGNOSIS — J45909 Unspecified asthma, uncomplicated: Secondary | ICD-10-CM | POA: Insufficient documentation

## 2017-04-18 DIAGNOSIS — Z79899 Other long term (current) drug therapy: Secondary | ICD-10-CM | POA: Insufficient documentation

## 2017-04-18 DIAGNOSIS — Z6841 Body Mass Index (BMI) 40.0 and over, adult: Secondary | ICD-10-CM | POA: Insufficient documentation

## 2017-04-18 DIAGNOSIS — R197 Diarrhea, unspecified: Secondary | ICD-10-CM | POA: Insufficient documentation

## 2017-04-18 DIAGNOSIS — E119 Type 2 diabetes mellitus without complications: Secondary | ICD-10-CM | POA: Insufficient documentation

## 2017-04-18 LAB — CBC WITH DIFFERENTIAL/PLATELET
Basophils Absolute: 0 10*3/uL (ref 0.0–0.1)
Basophils Relative: 0 %
Eosinophils Absolute: 0.2 10*3/uL (ref 0.0–0.7)
Eosinophils Relative: 4 %
HCT: 37.8 % (ref 36.0–46.0)
Hemoglobin: 11.7 g/dL — ABNORMAL LOW (ref 12.0–15.0)
Lymphocytes Relative: 39 %
Lymphs Abs: 2.1 10*3/uL (ref 0.7–4.0)
MCH: 27.8 pg (ref 26.0–34.0)
MCHC: 31 g/dL (ref 30.0–36.0)
MCV: 89.8 fL (ref 78.0–100.0)
Monocytes Absolute: 0.6 10*3/uL (ref 0.1–1.0)
Monocytes Relative: 11 %
Neutro Abs: 2.4 10*3/uL (ref 1.7–7.7)
Neutrophils Relative %: 46 %
Platelets: 224 10*3/uL (ref 150–400)
RBC: 4.21 MIL/uL (ref 3.87–5.11)
RDW: 13.7 % (ref 11.5–15.5)
WBC: 5.3 10*3/uL (ref 4.0–10.5)

## 2017-04-18 LAB — COMPREHENSIVE METABOLIC PANEL
ALT: 34 U/L (ref 14–54)
AST: 27 U/L (ref 15–41)
Albumin: 3.3 g/dL — ABNORMAL LOW (ref 3.5–5.0)
Alkaline Phosphatase: 72 U/L (ref 38–126)
Anion gap: 7 (ref 5–15)
BUN: 8 mg/dL (ref 6–20)
CO2: 27 mmol/L (ref 22–32)
Calcium: 8.6 mg/dL — ABNORMAL LOW (ref 8.9–10.3)
Chloride: 103 mmol/L (ref 101–111)
Creatinine, Ser: 0.61 mg/dL (ref 0.44–1.00)
GFR calc Af Amer: >60 mL/min (ref >60)
GFR calc non Af Amer: >60 mL/min (ref >60)
Glucose, Bld: 151 mg/dL — ABNORMAL HIGH (ref 65–99)
Potassium: 3.9 mmol/L (ref 3.5–5.1)
Sodium: 137 mmol/L (ref 135–145)
Total Bilirubin: 0.3 mg/dL (ref 0.3–1.2)
Total Protein: 6.7 g/dL (ref 6.5–8.1)

## 2017-04-18 MED ORDER — METOCLOPRAMIDE HCL 5 MG/ML IJ SOLN
5.0000 mg | Freq: Once | INTRAMUSCULAR | Status: AC
  Administered 2017-04-18: 5 mg via INTRAVENOUS
  Filled 2017-04-18: qty 2

## 2017-04-18 MED ORDER — METOCLOPRAMIDE HCL 10 MG PO TABS: 10.0000 mg | ORAL_TABLET | Freq: Four times a day (QID) | ORAL | 0 refills | Status: DC | PRN

## 2017-04-18 MED ORDER — ACETAMINOPHEN 500 MG PO TABS
1000.0000 mg | ORAL_TABLET | Freq: Once | ORAL | Status: AC
  Administered 2017-04-18: 1000 mg via ORAL
  Filled 2017-04-18: qty 2

## 2017-04-18 MED ORDER — SODIUM CHLORIDE 0.9 % IV BOLUS (SEPSIS)
1000.0000 mL | Freq: Once | INTRAVENOUS | Status: DC
Start: 2017-04-18 — End: 2017-04-18

## 2017-04-18 MED ORDER — SODIUM CHLORIDE 0.9 % IV BOLUS (SEPSIS)
1000.0000 mL | Freq: Once | INTRAVENOUS | Status: AC
  Administered 2017-04-18: 1000 mL via INTRAVENOUS

## 2017-04-18 NOTE — ED Triage Notes (Signed)
C/o swelling to R side of face, non-productive cough, nasal congestion, and sore throat since yesterday.  Pt received flu shot in August.

## 2017-04-18 NOTE — ED Notes (Signed)
EDP at bedside  

## 2017-04-18 NOTE — Discharge Instructions (Signed)
Take Tylenol as directed every 4 hours for aches.  Take the medication prescribed for nausea.  Take Imodium as directed for diarrhea.  Up to 8 doses per day.Make sure that you drink at least six 8 ounce glasses of water each day in order to stay well-hydrated.  Drink sugar free drinks.  Your blood sugar was mildly elevated today at 151.  Avoid milk or foods containing milk such as cheese or ice cream while having diarrhea.  See Dr.Avbuere in the office if not feeling better in 3 or 4 days.  Return to the emergency department if unable to hold down fluids without vomiting, or if concern for any reason.  Ask Dr.Avbuere put you on a diet and exercise program

## 2017-04-18 NOTE — ED Provider Notes (Signed)
[facility] EMERGENCY DEPARTMENT Provider Note   CSN: [id number] Arrival date & time: 04/18/17  1625     History   Chief Complaint Chief Complaint  Patient presents with  . Facial Swelling  . Cough  . Nasal Congestion    HPI Yvette Gibbs is a 26 y.o. female.  Planes of left-sided facial swelling and pain for the past 3-4 days.  Pain is worse with lying on the left side of her face.  Also complains of cough.  Had temperature of 103.2 degrees 2 days ago.  Treated herself with Tylenol.  Last dose yesterday.  Patient admits to 2 episodes of vomiting today and several episodes of diarrhea today.  She presently complains of mild nausea.  Nothing makes symptoms better or worse.  No other associated symptoms.  HPI  Past Medical History:  Diagnosis Date  . Asthma   . Depression   . Diabetes mellitus type 2, uncontrolled (HCC)   . Extreme obesity   . Family history of adverse reaction to anesthesia    " my grandmother had an allergic reaction  . Mental disorder   . Obesity   . OSA (obstructive sleep apnea)     Patient Active Problem List   Diagnosis Date Noted  . HCAP (healthcare-associated pneumonia) 09/07/2015  . Sepsis (HCC) 09/07/2015  . Asthma 09/07/2015  . Pain in the chest   . Dyspnea 09/01/2015  . Pleuritic chest pain 09/01/2015  . Morbid obesity (HCC) 09/01/2015  . Asthma exacerbation 09/01/2015  . OSA (obstructive sleep apnea) 09/01/2015  . Influenza A 09/01/2015  . Extreme obesity 09/06/2014  . Diabetes mellitus type 2, uncontrolled (HCC) 09/06/2014  . Biliary colic 09/04/2014    Past Surgical History:  Procedure Laterality Date  . APPENDECTOMY    . TONSILLECTOMY      OB History    No data available       Home Medications    Prior to Admission medications   Medication Sig Start Date End Date Taking? Authorizing Provider  acetaminophen (TYLENOL) 500 MG tablet Take 1 tablet (500 mg total) by mouth every 6 (six) hours as needed.  02/26/17   Fawze, Mina A, PA-C  albuterol (PROVENTIL HFA;VENTOLIN HFA) 108 (90 Base) MCG/ACT inhaler Inhale 1-2 puffs into the lungs every 6 (six) hours as needed for wheezing or shortness of breath.    [provider]  ibuprofen (ADVIL,MOTRIN) 600 MG tablet Take 1 tablet (600 mg total) by mouth every 6 (six) hours as needed. 02/26/17   Fawze, Mina A, PA-C  omeprazole (PRILOSEC) 20 MG capsule Take 1 capsule (20 mg total) by mouth daily. Patient not taking: Reported on 10/27/2016 09/23/16   Long, Arlyss Repress, MD  ondansetron (ZOFRAN ODT) 4 MG disintegrating tablet Take 1 tablet (4 mg total) by mouth every 8 (eight) hours as needed for nausea or vomiting. 02/26/17   Fawze, Mina A, PA-C  sucralfate (CARAFATE) 1 GM/10ML suspension Take 10 mLs (1 g total) by mouth 4 (four) times daily -  with meals and at bedtime. Patient not taking: Reported on 10/27/2016 09/23/16   Long, Arlyss Repress, MD    Family History Family History  Problem Relation Age of Onset  . Diabetes Mellitus II Maternal Grandmother     Social History Social History   Tobacco Use  . Smoking status: Never Smoker  . Smokeless tobacco: Never Used  Substance Use Topics  . Alcohol use: No  . Drug use: No     Allergies  Fish allergy   Review of Systems Review of Systems  Constitutional: Positive for fever.  HENT: Positive for facial swelling.   Respiratory: Positive for cough.   Gastrointestinal: Positive for diarrhea, nausea and vomiting.  Allergic/Immunologic: Positive for immunocompromised state.  Neurological: Positive for light-headedness.       Complains of lightheadedness with standing  All other systems reviewed and are negative.    Physical Exam Updated Vital Signs BP (!) 144/62 (BP Location: Right Arm)   Pulse 100   Temp 98.5 F (36.9 C) (Oral)   Resp 18   Ht 5\' 1"  (1.549 m)   Wt (!) 151 kg (333 lb)   SpO2 100%   BMI 62.92 kg/m   Physical Exam  Constitutional: She is oriented to person, place, and  time. She appears well-developed and well-nourished.  HENT:  Head: Normocephalic and atraumatic.  Right Ear: External ear normal.  Left Ear: External ear normal.  Mouth/Throat: Oropharynx is clear and moist.  No facial asymmetry or redness.  Bilateral tympanic membranes normal  Eyes: Conjunctivae are normal. Pupils are equal, round, and reactive to light.  Neck: Neck supple. No tracheal deviation present. No thyromegaly present.  Cardiovascular: Normal rate and regular rhythm.  No murmur heard. Pulmonary/Chest: Effort normal and breath sounds normal.  Abdominal: Soft. Bowel sounds are normal. She exhibits no distension. There is no tenderness.  Morbidly obese  Musculoskeletal: Normal range of motion. She exhibits no edema or tenderness.  Lymphadenopathy:    She has no cervical adenopathy.  Neurological: She is alert and oriented to person, place, and time. She exhibits normal muscle tone. Coordination normal.  Skin: Skin is warm and dry. Capillary refill takes less than 2 seconds. No rash noted.  Psychiatric: She has a normal mood and affect.  Nursing note and vitals reviewed.    ED Treatments / Results  Labs (all labs ordered are listed, but only abnormal results are displayed) Labs Reviewed  CBC WITH DIFFERENTIAL/PLATELET  COMPREHENSIVE METABOLIC PANEL    EKG  EKG Interpretation None       Radiology No results found.  Procedures Procedures (including critical care time)  Medications Ordered in ED Medications  sodium chloride 0.9 % bolus 1,000 mL (not administered)  sodium chloride 0.9 % bolus 1,000 mL (not administered)  metoCLOPramide (REGLAN) injection 5 mg (not administered)    Chest x-ray reviewed by me. Results for orders placed or performed during the hospital encounter of 04/18/17  CBC with Differential/Platelet  Result Value Ref Range   WBC 5.3 4.0 - 10.5 K/uL   RBC 4.21 3.87 - 5.11 MIL/uL   Hemoglobin 11.7 (L) 12.0 - 15.0 g/dL   HCT 40.937.8 81.136.0 -  91.446.0 %   MCV 89.8 78.0 - 100.0 fL   MCH 27.8 26.0 - 34.0 pg   MCHC 31.0 30.0 - 36.0 g/dL   RDW 78.213.7 95.611.5 - 21.315.5 %   Platelets 224 150 - 400 K/uL   Neutrophils Relative % 46 %   Neutro Abs 2.4 1.7 - 7.7 K/uL   Lymphocytes Relative 39 %   Lymphs Abs 2.1 0.7 - 4.0 K/uL   Monocytes Relative 11 %   Monocytes Absolute 0.6 0.1 - 1.0 K/uL   Eosinophils Relative 4 %   Eosinophils Absolute 0.2 0.0 - 0.7 K/uL   Basophils Relative 0 %   Basophils Absolute 0.0 0.0 - 0.1 K/uL  Comprehensive metabolic panel  Result Value Ref Range   Sodium 137 135 - 145 mmol/L   Potassium 3.9  3.5 - 5.1 mmol/L   Chloride 103 101 - 111 mmol/L   CO2 27 22 - 32 mmol/L   Glucose, Bld 151 (H) 65 - 99 mg/dL   BUN 8 6 - 20 mg/dL   Creatinine, Ser 1.61 0.44 - 1.00 mg/dL   Calcium 8.6 (L) 8.9 - 10.3 mg/dL   Total Protein 6.7 6.5 - 8.1 g/dL   Albumin 3.3 (L) 3.5 - 5.0 g/dL   AST 27 15 - 41 U/L   ALT 34 14 - 54 U/L   Alkaline Phosphatase 72 38 - 126 U/L   Total Bilirubin 0.3 0.3 - 1.2 mg/dL   GFR calc non Af Amer >60 >60 mL/min   GFR calc Af Amer >60 >60 mL/min   Anion gap 7 5 - 15   Dg Chest 2 View  Result Date: 04/18/2017 CLINICAL DATA:  Mid to left chest pain with shortness of breath. Productive cough beginning last night. History of asthma and diabetes. EXAM: CHEST  2 VIEW COMPARISON:  02/26/2017 FINDINGS: Slightly shallow inspiration. Normal heart size and pulmonary vascularity. No focal airspace disease or consolidation in the lungs. No blunting of costophrenic angles. No pneumothorax. Mediastinal contours appear intact. IMPRESSION: No active cardiopulmonary disease. Electronically Signed   By: Burman Nieves M.D.   On: 04/18/2017 21:09  Chest x-ray reviewed by me Initial Impression / Assessment and Plan / ED Course  I have reviewed the triage vital signs and the nursing notes.  Pertinent labs & imaging results that were available during my care of the patient were reviewed by me and considered in my medical  decision making (see chart for details).     9:15 PM feels improved after treatment with Tylenol, intravenous duration with normal saline and intravenous Reglan.  No longer nauseated.  Presently hungry. Plan prescription for Reglan.  Tylenol for pain.  Imodium for diarrhea.  Avoid dairy.  Encourage hydration.  Follow-up with Dr. Concepcion Elk.  I had lengthy discussion with patient that she should ask for Dr.Abbuere put her on diet and exercise plan Final Clinical Impressions(s) / ED Diagnoses  Diagnosis #1 viral illness #2 nausea vomiting diarrhea #3 morbid obesity Final diagnoses:  None    New Prescriptions This SmartLink is deprecated. Use AVSMEDLIST instead to display the medication list for a patient.   Doug Sou, MD 04/18/17 2119

## 2017-07-31 ENCOUNTER — Other Ambulatory Visit: Payer: Self-pay

## 2017-07-31 ENCOUNTER — Emergency Department (HOSPITAL_COMMUNITY): Payer: Self-pay

## 2017-07-31 ENCOUNTER — Emergency Department (HOSPITAL_COMMUNITY)
Admission: EM | Admit: 2017-07-31 | Discharge: 2017-07-31 | Disposition: A | Discharge status: Home / Self Care | Payer: Self-pay | Attending: Emergency Medicine | Admitting: Emergency Medicine

## 2017-07-31 ENCOUNTER — Encounter (HOSPITAL_COMMUNITY): Payer: Self-pay | Admitting: *Deleted

## 2017-07-31 DIAGNOSIS — E119 Type 2 diabetes mellitus without complications: Secondary | ICD-10-CM | POA: Insufficient documentation

## 2017-07-31 DIAGNOSIS — J45909 Unspecified asthma, uncomplicated: Secondary | ICD-10-CM | POA: Insufficient documentation

## 2017-07-31 DIAGNOSIS — R0602 Shortness of breath: Secondary | ICD-10-CM

## 2017-07-31 LAB — CBC WITH DIFFERENTIAL/PLATELET
Basophils Absolute: 0 10*3/uL (ref 0.0–0.1)
Basophils Relative: 0 %
Eosinophils Absolute: 0.1 10*3/uL (ref 0.0–0.7)
Eosinophils Relative: 3 %
HCT: 41 % (ref 36.0–46.0)
Hemoglobin: 12.9 g/dL (ref 12.0–15.0)
Lymphocytes Relative: 29 %
Lymphs Abs: 1.3 10*3/uL (ref 0.7–4.0)
MCH: 28.4 pg (ref 26.0–34.0)
MCHC: 31.5 g/dL (ref 30.0–36.0)
MCV: 90.1 fL (ref 78.0–100.0)
Monocytes Absolute: 0.7 10*3/uL (ref 0.1–1.0)
Monocytes Relative: 16 %
Neutro Abs: 2.3 10*3/uL (ref 1.7–7.7)
Neutrophils Relative %: 52 %
Platelets: 281 10*3/uL (ref 150–400)
RBC: 4.55 MIL/uL (ref 3.87–5.11)
RDW: 14.2 % (ref 11.5–15.5)
WBC: 4.5 10*3/uL (ref 4.0–10.5)

## 2017-07-31 LAB — INFLUENZA PANEL BY PCR (TYPE A & B)
Influenza A By PCR: NEGATIVE
Influenza B By PCR: NEGATIVE

## 2017-07-31 LAB — D-DIMER, QUANTITATIVE: D-Dimer, Quant: 0.43 ug/mL-FEU (ref 0.00–0.50)

## 2017-07-31 LAB — COMPREHENSIVE METABOLIC PANEL
ALT: 42 U/L (ref 14–54)
AST: 32 U/L (ref 15–41)
Albumin: 3.3 g/dL — ABNORMAL LOW (ref 3.5–5.0)
Alkaline Phosphatase: 66 U/L (ref 38–126)
Anion gap: 9 (ref 5–15)
BUN: 10 mg/dL (ref 6–20)
CO2: 26 mmol/L (ref 22–32)
Calcium: 9.5 mg/dL (ref 8.9–10.3)
Chloride: 105 mmol/L (ref 101–111)
Creatinine, Ser: 0.81 mg/dL (ref 0.44–1.00)
GFR calc Af Amer: >60 mL/min (ref >60)
GFR calc non Af Amer: >60 mL/min (ref >60)
Glucose, Bld: 116 mg/dL — ABNORMAL HIGH (ref 65–99)
Potassium: 4.2 mmol/L (ref 3.5–5.1)
Sodium: 140 mmol/L (ref 135–145)
Total Bilirubin: 0.6 mg/dL (ref 0.3–1.2)
Total Protein: 7.2 g/dL (ref 6.5–8.1)

## 2017-07-31 LAB — I-STAT TROPONIN, ED: Troponin i, poc: 0 ng/mL (ref 0.00–0.08)

## 2017-07-31 MED ORDER — IPRATROPIUM-ALBUTEROL 0.5-2.5 (3) MG/3ML IN SOLN
3.0000 mL | Freq: Once | RESPIRATORY_TRACT | Status: AC
  Administered 2017-07-31: 3 mL via RESPIRATORY_TRACT
  Filled 2017-07-31: qty 3

## 2017-07-31 MED ORDER — OSELTAMIVIR PHOSPHATE 75 MG PO CAPS: 75.0000 mg | ORAL_CAPSULE | Freq: Two times a day (BID) | ORAL | 0 refills | Status: DC

## 2017-07-31 MED ORDER — PREDNISONE 20 MG PO TABS
60.0000 mg | ORAL_TABLET | Freq: Once | ORAL | Status: AC
  Administered 2017-07-31: 60 mg via ORAL
  Filled 2017-07-31: qty 3

## 2017-07-31 MED ORDER — PREDNISONE 10 MG PO TABS: ORAL_TABLET | ORAL | 0 refills | Status: DC

## 2017-07-31 MED ORDER — HYDROCODONE-ACETAMINOPHEN 5-325 MG PO TABS
2.0000 | ORAL_TABLET | Freq: Once | ORAL | Status: AC
  Administered 2017-07-31: 2 via ORAL
  Filled 2017-07-31: qty 2

## 2017-07-31 NOTE — ED Provider Notes (Signed)
[facility] EMERGENC[facility] Provider Note   CSN: [id number] Arrival date & time: 07/31/17  1607     History   Chief Complaint Chief Complaint  Patient presents with  . Shortness of Breath  . Cough    HPI Yvette Gibbs is a 27 y.o. female.  The history is provided by the patient. No language interpreter was used.  Shortness of Breath  This is a new problem. The average episode lasts 1 day. The problem occurs continuously.The current episode started yesterday. The problem has been gradually worsening. Associated symptoms include cough. She has tried beta-agonist inhalers for the symptoms. The treatment provided no relief. She has had no prior hospitalizations. Associated medical issues include asthma.  Cough  Associated symptoms include shortness of breath. Her past medical history is significant for asthma.   Pt complains of pain in her chest.  Pt reports she has been coughing up blood.  Pt reports she felt like this in the past with the flu and she has been exposed to the flu.  Pt reports she is more short of breath than usual.  No relief with inhaler today. Past Medical History:  Diagnosis Date  . Asthma   . Depression   . Diabetes mellitus type 2, uncontrolled (HCC)   . Extreme obesity   . Family history of adverse reaction to anesthesia    " my grandmother had an allergic reaction  . Mental disorder   . Obesity   . OSA (obstructive sleep apnea)     Patient Active Problem List   Diagnosis Date Noted  . HCAP (healthcare-associated pneumonia) 09/07/2015  . Sepsis (HCC) 09/07/2015  . Asthma 09/07/2015  . Pain in the chest   . Dyspnea 09/01/2015  . Pleuritic chest pain 09/01/2015  . Morbid obesity (HCC) 09/01/2015  . Asthma exacerbation 09/01/2015  . OSA (obstructive sleep apnea) 09/01/2015  . Influenza A 09/01/2015  . Extreme obesity 09/06/2014  . Diabetes mellitus type 2, uncontrolled (HCC) 09/06/2014  . Biliary colic 09/04/2014     Past Surgical History:  Procedure Laterality Date  . APPENDECTOMY    . CHOLECYSTECTOMY N/A 09/05/2014   Procedure: LAPAROSCOPIC CHOLECYSTECTOMY;  Surgeon: Axel FillerArmando Ramirez, MD;  Location: [facility] OR;  Service: General;  Laterality: N/A;  . TONSILLECTOMY      OB History    No data available       Home Medications    Prior to Admission medications   Medication Sig Start Date End Date Taking? Authorizing Provider  acetaminophen (TYLENOL) 500 MG tablet Take 1 tablet (500 mg total) by mouth every 6 (six) hours as needed. 02/26/17   Fawze, Mina A, PA-C  albuterol (PROVENTIL HFA;VENTOLIN HFA) 108 (90 Base) MCG/ACT inhaler Inhale 1-2 puffs into the lungs every 6 (six) hours as needed for wheezing or shortness of breath.    [provider]  ibuprofen (ADVIL,MOTRIN) 600 MG tablet Take 1 tablet (600 mg total) by mouth every 6 (six) hours as needed. 02/26/17   Luevenia MaxinFawze, Mina A, PA-C  metoCLOPramide (REGLAN) 10 MG tablet Take 1 tablet (10 mg total) every 6 (six) hours as needed by mouth for nausea or vomiting. 04/18/17   Doug SouJacubowitz, Sam, MD  omeprazole (PRILOSEC) 20 MG capsule Take 1 capsule (20 mg total) by mouth daily. Patient not taking: Reported on 10/27/2016 09/23/16   Long, Arlyss RepressJoshua G, MD  ondansetron (ZOFRAN ODT) 4 MG disintegrating tablet Take 1 tablet (4 mg total) by mouth every 8 (eight) hours as needed for nausea or  vomiting. 02/26/17   Fawze, Mina A, PA-C  sucralfate (CARAFATE) 1 GM/10ML suspension Take 10 mLs (1 g total) by mouth 4 (four) times daily -  with meals and at bedtime. Patient not taking: Reported on 10/27/2016 09/23/16   Long, Arlyss Repress, MD    Family History Family History  Problem Relation Age of Onset  . Diabetes Mellitus II Maternal Grandmother     Social History Social History   Tobacco Use  . Smoking status: Never Smoker  . Smokeless tobacco: Never Used  Substance Use Topics  . Alcohol use: No  . Drug use: No     Allergies   Fish allergy   Review of  Systems Review of Systems  Respiratory: Positive for cough and shortness of breath.   All other systems reviewed and are negative.    Physical Exam Updated Vital Signs BP (!) 158/98   Pulse 98   Temp 100 F (37.8 C)   Resp (!) 26   SpO2 100%   Physical Exam  Constitutional: She appears well-developed and well-nourished. No distress.  HENT:  Head: Normocephalic and atraumatic.  Mouth/Throat: Oropharynx is clear and moist.  Eyes: Conjunctivae and EOM are normal. Pupils are equal, round, and reactive to light.  Neck: Normal range of motion. Neck supple.  Cardiovascular: Regular rhythm.  No murmur heard. tachycardia  Pulmonary/Chest: Effort normal. No respiratory distress. She has rhonchi in the right lower field.  Abdominal: Soft. There is no tenderness.  Musculoskeletal: She exhibits no edema.  Neurological: She is alert.  Skin: Skin is warm and dry.  Psychiatric: She has a normal mood and affect.  Nursing note and vitals reviewed.    ED Treatments / Results  Labs (all labs ordered are listed, but only abnormal results are displayed) Labs Reviewed - No data to display  EKG  EKG Interpretation None       Radiology Dg Chest 2 View  Result Date: 07/31/2017 CLINICAL DATA:  Cough and SOB. EXAM: CHEST  2 VIEW COMPARISON:  04/18/2017 FINDINGS: The heart size and mediastinal contours are within normal limits. Asymmetric elevation of right hemidiaphragm, stable. Both lungs are clear. The visualized skeletal structures are unremarkable. IMPRESSION: No active cardiopulmonary disease. Electronically Signed   By: Signa Kell M.D.   On: 07/31/2017 16:54    Procedures Procedures (including critical care time)  Medications Ordered in ED Medications - No data to display   Initial Impression / Assessment and Plan / ED Course  I have reviewed the triage vital signs and the nursing notes.  Pertinent labs & imaging results that were available during my care of the patient  were reviewed by me and considered in my medical decision making (see chart for details).     MDM   Chest xray normal.  Labs including ddimer ordered.  Pt given duoneb.    Final Clinical Impressions(s) / ED Diagnoses   Final diagnoses:  None    ED Discharge Orders    None       Osie Cheeks 07/31/17 2146    Loren Racer, MD 07/31/17 2150

## 2017-07-31 NOTE — ED Notes (Signed)
ED Provider at bedside. 

## 2017-07-31 NOTE — Discharge Instructions (Addendum)
See your Physician for recheck next week  °

## 2017-07-31 NOTE — ED Triage Notes (Signed)
Pt reports exposure to flu, having cough with sob since yesterday.l has chest pain with cough. No relief with inhaler at home. Airway intact at triage and speaking in full sentences.

## 2018-08-01 IMAGING — CR DG CHEST 2V
2 series · 2 of 2 positions shown · non-contrast
Comparison: 02/26/2017

CLINICAL DATA: Mid to left chest pain with shortness of breath.
Productive cough beginning last night. History of asthma and
diabetes.

EXAM:
CHEST  2 VIEW

[chest pa]
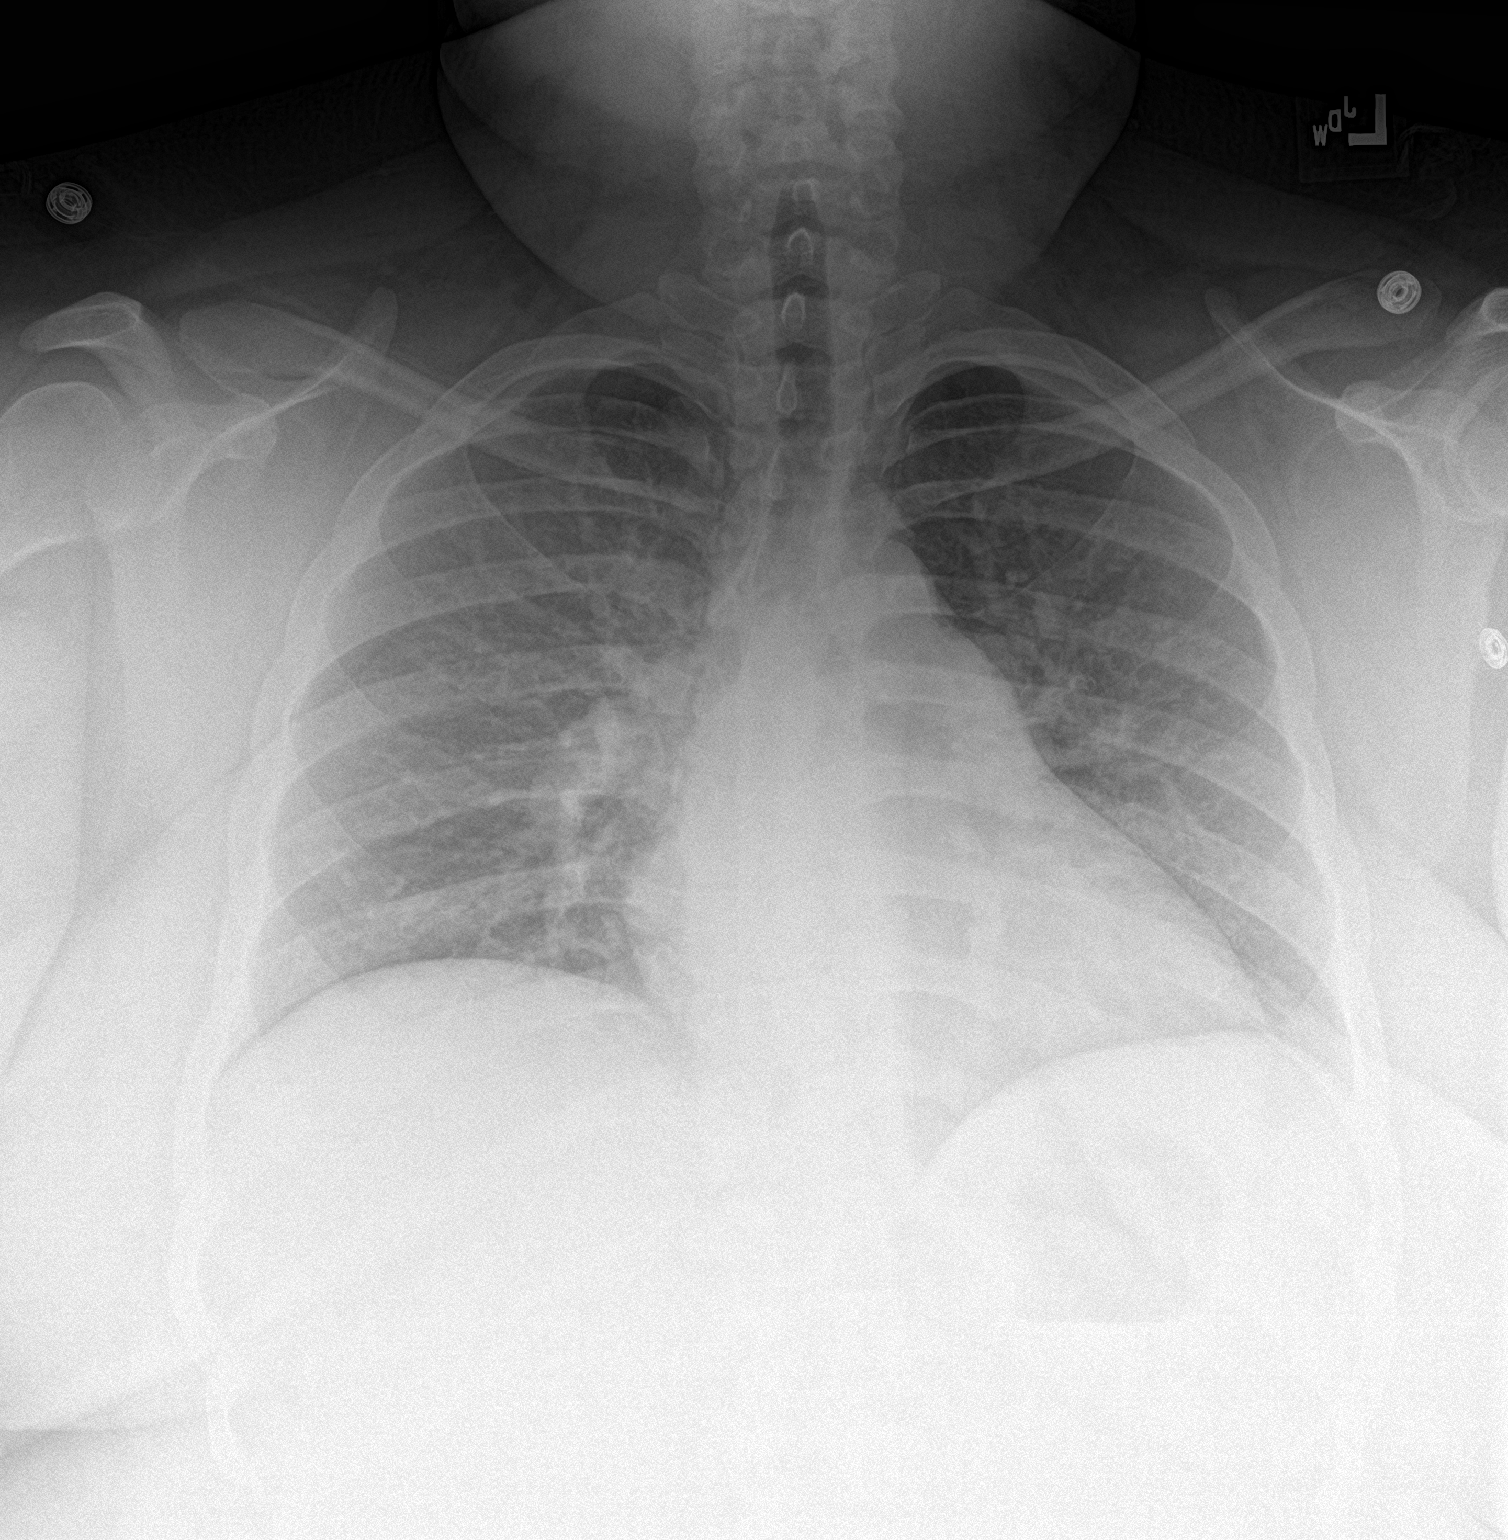

[chest lat]
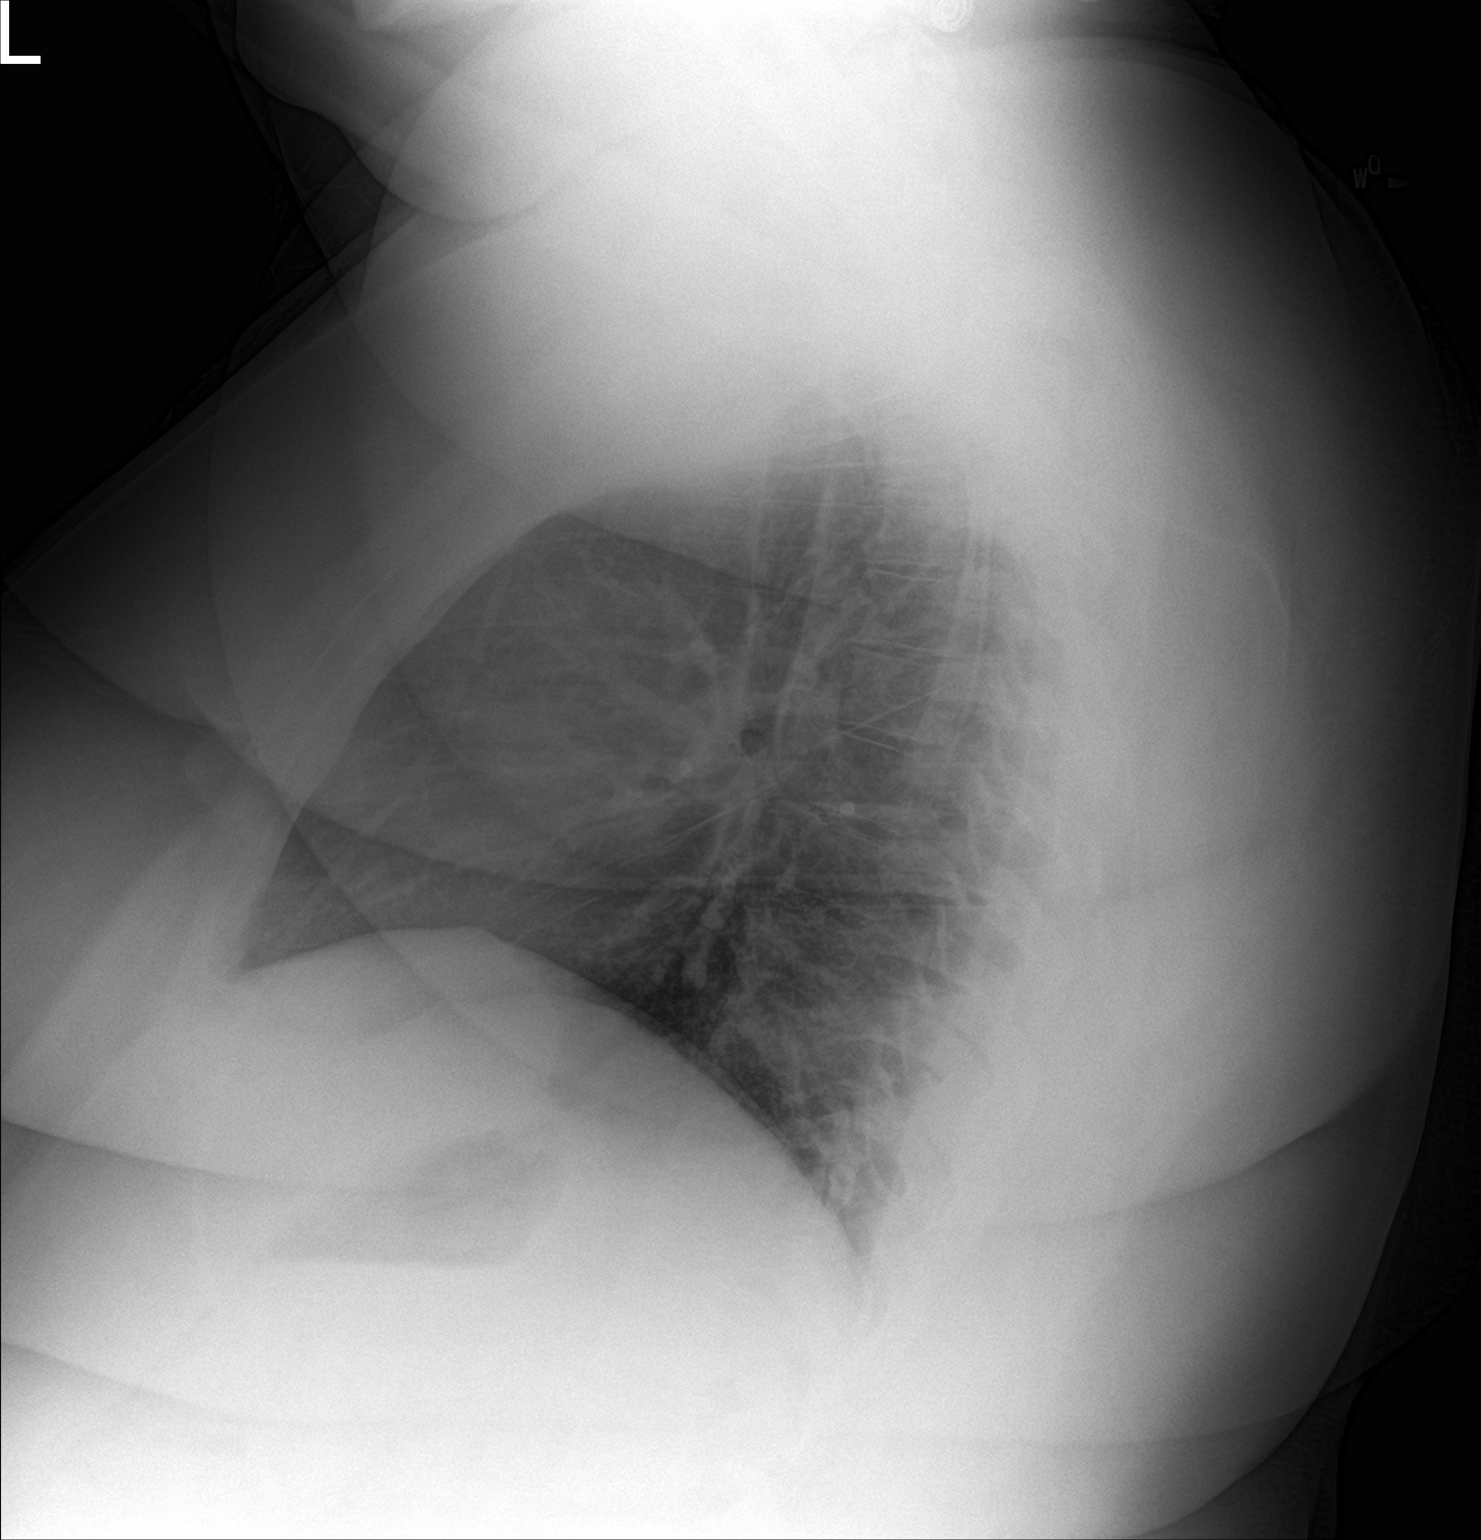

[2 of 2 positions shown; findings below may reference images not displayed]

FINDINGS: Slightly shallow inspiration. Normal heart size and pulmonary
vascularity. No focal airspace disease or consolidation in the
lungs. No blunting of costophrenic angles. No pneumothorax.
Mediastinal contours appear intact.
IMPRESSION: No active cardiopulmonary disease.

## 2018-11-09 ENCOUNTER — Other Ambulatory Visit: Payer: Self-pay

## 2018-11-09 ENCOUNTER — Ambulatory Visit (INDEPENDENT_AMBULATORY_CARE_PROVIDER_SITE_OTHER): Payer: Medicaid Other

## 2018-11-09 DIAGNOSIS — Z3202 Encounter for pregnancy test, result negative: Secondary | ICD-10-CM

## 2018-11-09 DIAGNOSIS — N926 Irregular menstruation, unspecified: Secondary | ICD-10-CM

## 2018-11-09 LAB — POCT URINE PREGNANCY: Preg Test, Ur: NEGATIVE

## 2018-11-09 NOTE — Progress Notes (Signed)
Yvette Gibbs presents today for UPT. She is trying to conceive. She has no unusual complaints.  LMP: 10/05/2018    OBJECTIVE: Appears well, in no apparent distress.  OB History   No obstetric history on file.    Home UPT Result: n/a In-Office UPT result: Neg  I have reviewed the patient's medical, obstetrical, social, and family histories, and medications.   ASSESSMENT: Negative pregnancy test  PLAN: Repeat UPT at home in 2 weeks.  Contact the office for an appt if +UPT or PRN

## 2018-11-09 NOTE — Progress Notes (Signed)
I have reviewed the chart and agree with nursing staff's documentation of this patient's encounter.  Alaisha Eversley, MD 11/09/2018 3:29 PM    

## 2019-03-03 ENCOUNTER — Other Ambulatory Visit: Payer: Self-pay

## 2019-03-03 ENCOUNTER — Emergency Department (HOSPITAL_COMMUNITY): Payer: Medicaid Other

## 2019-03-03 ENCOUNTER — Observation Stay (HOSPITAL_COMMUNITY)
Admission: EM | Admit: 2019-03-03 | Discharge: 2019-03-04 | Disposition: A | Discharge status: Home / Self Care | Payer: Medicaid Other | Attending: Internal Medicine | Admitting: Internal Medicine

## 2019-03-03 ENCOUNTER — Encounter (HOSPITAL_COMMUNITY): Payer: Self-pay | Admitting: Emergency Medicine

## 2019-03-03 DIAGNOSIS — R6889 Other general symptoms and signs: Secondary | ICD-10-CM | POA: Diagnosis present

## 2019-03-03 DIAGNOSIS — J4520 Mild intermittent asthma, uncomplicated: Secondary | ICD-10-CM

## 2019-03-03 DIAGNOSIS — J45901 Unspecified asthma with (acute) exacerbation: Secondary | ICD-10-CM | POA: Insufficient documentation

## 2019-03-03 DIAGNOSIS — E668 Other obesity: Secondary | ICD-10-CM

## 2019-03-03 DIAGNOSIS — E1165 Type 2 diabetes mellitus with hyperglycemia: Secondary | ICD-10-CM | POA: Insufficient documentation

## 2019-03-03 DIAGNOSIS — Z79899 Other long term (current) drug therapy: Secondary | ICD-10-CM | POA: Diagnosis not present

## 2019-03-03 DIAGNOSIS — R809 Proteinuria, unspecified: Secondary | ICD-10-CM | POA: Diagnosis not present

## 2019-03-03 DIAGNOSIS — Z6841 Body Mass Index (BMI) 40.0 and over, adult: Secondary | ICD-10-CM | POA: Diagnosis not present

## 2019-03-03 DIAGNOSIS — I10 Essential (primary) hypertension: Secondary | ICD-10-CM | POA: Diagnosis not present

## 2019-03-03 DIAGNOSIS — R1032 Left lower quadrant pain: Secondary | ICD-10-CM

## 2019-03-03 DIAGNOSIS — J189 Pneumonia, unspecified organism: Secondary | ICD-10-CM

## 2019-03-03 DIAGNOSIS — R109 Unspecified abdominal pain: Secondary | ICD-10-CM | POA: Diagnosis not present

## 2019-03-03 DIAGNOSIS — G4733 Obstructive sleep apnea (adult) (pediatric): Secondary | ICD-10-CM | POA: Diagnosis not present

## 2019-03-03 DIAGNOSIS — Z20822 Contact with and (suspected) exposure to covid-19: Secondary | ICD-10-CM

## 2019-03-03 DIAGNOSIS — Z20828 Contact with and (suspected) exposure to other viral communicable diseases: Secondary | ICD-10-CM | POA: Diagnosis not present

## 2019-03-03 DIAGNOSIS — J181 Lobar pneumonia, unspecified organism: Principal | ICD-10-CM | POA: Insufficient documentation

## 2019-03-03 DIAGNOSIS — J45909 Unspecified asthma, uncomplicated: Secondary | ICD-10-CM | POA: Diagnosis present

## 2019-03-03 DIAGNOSIS — E119 Type 2 diabetes mellitus without complications: Secondary | ICD-10-CM | POA: Diagnosis present

## 2019-03-03 DIAGNOSIS — IMO0002 Reserved for concepts with insufficient information to code with codable children: Secondary | ICD-10-CM | POA: Diagnosis present

## 2019-03-03 DIAGNOSIS — R739 Hyperglycemia, unspecified: Secondary | ICD-10-CM

## 2019-03-03 LAB — CBC WITH DIFFERENTIAL/PLATELET
Abs Immature Granulocytes: 0.03 10*3/uL (ref 0.00–0.07)
Basophils Absolute: 0 10*3/uL (ref 0.0–0.1)
Basophils Relative: 0 %
Eosinophils Absolute: 0.2 10*3/uL (ref 0.0–0.5)
Eosinophils Relative: 3 %
HCT: 39.2 % (ref 36.0–46.0)
Hemoglobin: 11.6 g/dL — ABNORMAL LOW (ref 12.0–15.0)
Immature Granulocytes: 0 %
Lymphocytes Relative: 26 %
Lymphs Abs: 1.8 10*3/uL (ref 0.7–4.0)
MCH: 27.9 pg (ref 26.0–34.0)
MCHC: 29.6 g/dL — ABNORMAL LOW (ref 30.0–36.0)
MCV: 94.2 fL (ref 80.0–100.0)
Monocytes Absolute: 0.9 10*3/uL (ref 0.1–1.0)
Monocytes Relative: 13 %
Neutro Abs: 3.9 10*3/uL (ref 1.7–7.7)
Neutrophils Relative %: 58 %
Platelets: 282 10*3/uL (ref 150–400)
RBC: 4.16 MIL/uL (ref 3.87–5.11)
RDW: 13.2 % (ref 11.5–15.5)
WBC: 6.8 10*3/uL (ref 4.0–10.5)
nRBC: 0 % (ref 0.0–0.2)

## 2019-03-03 LAB — URINALYSIS, ROUTINE W REFLEX MICROSCOPIC
Bilirubin Urine: NEGATIVE
Glucose, UA: 150 mg/dL — AB
Hgb urine dipstick: NEGATIVE
Ketones, ur: NEGATIVE mg/dL
Nitrite: NEGATIVE
Protein, ur: >=300 mg/dL — AB
Specific Gravity, Urine: 1.031 — ABNORMAL HIGH (ref 1.005–1.030)
pH: 5 (ref 5.0–8.0)

## 2019-03-03 LAB — I-STAT BETA HCG BLOOD, ED (MC, WL, AP ONLY): I-stat hCG, quantitative: <5 m[IU]/mL (ref <5)

## 2019-03-03 LAB — HEPATIC FUNCTION PANEL
ALT: 37 U/L (ref 0–44)
AST: 37 U/L (ref 15–41)
Albumin: 3.2 g/dL — ABNORMAL LOW (ref 3.5–5.0)
Alkaline Phosphatase: 69 U/L (ref 38–126)
Bilirubin, Direct: <0.1 mg/dL (ref 0.0–0.2)
Total Bilirubin: 0.2 mg/dL — ABNORMAL LOW (ref 0.3–1.2)
Total Protein: 7.6 g/dL (ref 6.5–8.1)

## 2019-03-03 LAB — CBC
HCT: 39.8 % (ref 36.0–46.0)
Hemoglobin: 11.5 g/dL — ABNORMAL LOW (ref 12.0–15.0)
MCH: 27.6 pg (ref 26.0–34.0)
MCHC: 28.9 g/dL — ABNORMAL LOW (ref 30.0–36.0)
MCV: 95.4 fL (ref 80.0–100.0)
Platelets: 270 10*3/uL (ref 150–400)
RBC: 4.17 MIL/uL (ref 3.87–5.11)
RDW: 13.3 % (ref 11.5–15.5)
WBC: 6.6 10*3/uL (ref 4.0–10.5)
nRBC: 0 % (ref 0.0–0.2)

## 2019-03-03 LAB — LIPASE, BLOOD: Lipase: 29 U/L (ref 11–51)

## 2019-03-03 LAB — I-STAT CHEM 8, ED
BUN: 9 mg/dL (ref 6–20)
Calcium, Ion: 1.27 mmol/L (ref 1.15–1.40)
Chloride: 98 mmol/L (ref 98–111)
Creatinine, Ser: 0.6 mg/dL (ref 0.44–1.00)
Glucose, Bld: 218 mg/dL — ABNORMAL HIGH (ref 70–99)
HCT: 38 % (ref 36.0–46.0)
Hemoglobin: 12.9 g/dL (ref 12.0–15.0)
Potassium: 3.9 mmol/L (ref 3.5–5.1)
Sodium: 139 mmol/L (ref 135–145)
TCO2: 30 mmol/L (ref 22–32)

## 2019-03-03 LAB — CREATININE, SERUM
Creatinine, Ser: 0.69 mg/dL (ref 0.44–1.00)
GFR calc Af Amer: >60 mL/min (ref >60)
GFR calc non Af Amer: >60 mL/min (ref >60)

## 2019-03-03 LAB — SARS CORONAVIRUS 2 BY RT PCR (HOSPITAL ORDER, PERFORMED IN ~~LOC~~ HOSPITAL LAB): SARS Coronavirus 2: NEGATIVE

## 2019-03-03 LAB — HEMOGLOBIN A1C
Hgb A1c MFr Bld: 7.8 % — ABNORMAL HIGH (ref 4.8–5.6)
Mean Plasma Glucose: 177.16 mg/dL

## 2019-03-03 MED ORDER — SODIUM CHLORIDE 0.9 % IV SOLN
1.0000 g | INTRAVENOUS | Status: DC
  Administered 2019-03-04: 1 g via INTRAVENOUS
  Filled 2019-03-03: qty 1

## 2019-03-03 MED ORDER — ALBUTEROL SULFATE (2.5 MG/3ML) 0.083% IN NEBU
2.5000 mg | INHALATION_SOLUTION | Freq: Two times a day (BID) | RESPIRATORY_TRACT | Status: DC
  Administered 2019-03-04: 2.5 mg via RESPIRATORY_TRACT
  Filled 2019-03-03: qty 3

## 2019-03-03 MED ORDER — ALBUTEROL SULFATE HFA 108 (90 BASE) MCG/ACT IN AERS: 1.0000 | INHALATION_SPRAY | Freq: Four times a day (QID) | RESPIRATORY_TRACT | Status: DC | PRN

## 2019-03-03 MED ORDER — SODIUM CHLORIDE 0.9 % IV SOLN
500.0000 mg | INTRAVENOUS | Status: DC
  Administered 2019-03-04: 500 mg via INTRAVENOUS
  Filled 2019-03-03: qty 500

## 2019-03-03 MED ORDER — ALUM & MAG HYDROXIDE-SIMETH 200-200-20 MG/5ML PO SUSP
30.0000 mL | Freq: Once | ORAL | Status: AC
  Administered 2019-03-03: 30 mL via ORAL
  Filled 2019-03-03: qty 30

## 2019-03-03 MED ORDER — SODIUM CHLORIDE 0.9 % IV SOLN
500.0000 mg | Freq: Once | INTRAVENOUS | Status: AC
  Administered 2019-03-03: 500 mg via INTRAVENOUS
  Filled 2019-03-03: qty 500

## 2019-03-03 MED ORDER — ONDANSETRON 4 MG PO TBDP: 4.0000 mg | ORAL_TABLET | Freq: Three times a day (TID) | ORAL | Status: DC | PRN

## 2019-03-03 MED ORDER — ALBUTEROL SULFATE (2.5 MG/3ML) 0.083% IN NEBU
2.5000 mg | INHALATION_SOLUTION | Freq: Four times a day (QID) | RESPIRATORY_TRACT | Status: DC | PRN
  Administered 2019-03-03 – 2019-03-04 (×2): 2.5 mg via RESPIRATORY_TRACT
  Filled 2019-03-03 (×2): qty 3

## 2019-03-03 MED ORDER — SODIUM CHLORIDE 0.9 % IV SOLN
1.0000 g | Freq: Once | INTRAVENOUS | Status: AC
  Administered 2019-03-03: 1 g via INTRAVENOUS
  Filled 2019-03-03: qty 10

## 2019-03-03 MED ORDER — KETOROLAC TROMETHAMINE 15 MG/ML IJ SOLN
15.0000 mg | Freq: Four times a day (QID) | INTRAMUSCULAR | Status: DC | PRN
  Administered 2019-03-03: 15 mg via INTRAVENOUS
  Filled 2019-03-03: qty 1

## 2019-03-03 MED ORDER — ALBUTEROL SULFATE HFA 108 (90 BASE) MCG/ACT IN AERS
8.0000 | INHALATION_SPRAY | Freq: Once | RESPIRATORY_TRACT | Status: AC
  Administered 2019-03-03: 06:00:00 8 via RESPIRATORY_TRACT
  Filled 2019-03-03: qty 6.7

## 2019-03-03 MED ORDER — LISINOPRIL 10 MG PO TABS
10.0000 mg | ORAL_TABLET | Freq: Every day | ORAL | Status: DC
  Administered 2019-03-03 – 2019-03-04 (×2): 10 mg via ORAL
  Filled 2019-03-03 (×2): qty 1

## 2019-03-03 MED ORDER — KETOROLAC TROMETHAMINE 30 MG/ML IJ SOLN
15.0000 mg | Freq: Once | INTRAMUSCULAR | Status: AC
  Administered 2019-03-03: 15 mg via INTRAVENOUS
  Filled 2019-03-03: qty 1

## 2019-03-03 MED ORDER — ENOXAPARIN SODIUM 40 MG/0.4ML ~~LOC~~ SOLN
40.0000 mg | SUBCUTANEOUS | Status: DC
  Administered 2019-03-03: 40 mg via SUBCUTANEOUS
  Filled 2019-03-03: qty 0.4

## 2019-03-03 MED ORDER — ACETAMINOPHEN 325 MG PO TABS
650.0000 mg | ORAL_TABLET | Freq: Four times a day (QID) | ORAL | Status: DC | PRN
  Administered 2019-03-03: 650 mg via ORAL
  Filled 2019-03-03: qty 2

## 2019-03-03 MED ORDER — LIDOCAINE VISCOUS HCL 2 % MT SOLN
15.0000 mL | Freq: Once | OROMUCOSAL | Status: AC
  Administered 2019-03-03: 15 mL via ORAL
  Filled 2019-03-03: qty 15

## 2019-03-03 NOTE — ED Provider Notes (Signed)
Signout from Dr. Nicholes Stairs.  28 year old female here with abdominal pain cough diarrhea fever fatigue.  Covid testing negative here although suspicious for Covid.  Chest x-ray showing pneumonia.  Ceftriaxone Zithromax ordered. Physical Exam  BP (!) 153/102   Pulse 100   Temp 99 F (37.2 C) (Oral)   Resp (!) 25   Ht 5\' 2"  (1.575 m)   Wt (!) 181.4 kg Comment: Pt does not know  SpO2 95%   BMI 73.16 kg/m   Physical Exam  ED Course/Procedures     Procedures  MDM  Patient is pending a CT abdomen and pelvis.  Will need admission.   Patient CT KUB noncontrast does not show any significant findings.  Discussed with [facility] hospitalist Dr. Marylyn Ishihara who will evaluate the patient for possible admission.  Will admit OBS.      Hayden Rasmussen, MD 03/03/19 1910

## 2019-03-03 NOTE — ED Notes (Signed)
Pt off unit via [place] with nurse tech. No s/s of acute distress noted.

## 2019-03-03 NOTE — Progress Notes (Signed)
Upon rounding pt noted ti be SOB with audibile exp. Wheezing noted. Sat 88-92%. Placed on 2 liters of [place] O2. Sats return to 100%. Resp called for treatment. Noted decrease in SOB.

## 2019-03-03 NOTE — H&P (Addendum)
.  History and Physical    Yvette Gibbs ONG:[id number] DOB: [birth year] DOA: 03/03/2019  PCP: Nolene Ebbs, MD  Patient coming from: Home   Chief Complaint: Abdominal pain  HPI: Yvette Gibbs is a 28 y.o. female with medical history significant of asthma. Presenting with 2 weeks of LLQ abdominal pain. She said it was intermittent in nature. She thought she had pulled a muscle at first, and if she shifted position, things would slightly improve. This continued through this morning when the sharp pain woke her out of her sleep and nothing she could do could stop it. She is not aware of any aggravating factors. She reports that she has had diarrhea and nausea to go along with this. She denies any sick contacts. She became concerned and came to the ED.   ED Course: Eval'd by staff. CT ab/pelvis was negative. CXR was positive for PNA. COVID negative. TRH called for admission.   Review of Systems: Denies CP, palpitations, V, F, change in urination. Reports HA, myalgias, ab pain, N. Remainder of 10 point review of systems is otherwise negative for all not mentioned in HPI.    Past Medical History:  Diagnosis Date   Asthma    Depression    Diabetes mellitus type 2, uncontrolled ([place])    Extreme obesity    Family history of adverse reaction to anesthesia    " my grandmother had an allergic reaction   Mental disorder    Obesity    OSA (obstructive sleep apnea)     Past Surgical History:  Procedure Laterality Date   APPENDECTOMY     CHOLECYSTECTOMY N/A 09/05/2014   Procedure: LAPAROSCOPIC CHOLECYSTECTOMY;  Surgeon: Ralene Ok, MD;  Location: [facility];  Service: General;  Laterality: N/A;   TONSILLECTOMY       reports that she has never smoked. She has never used smokeless tobacco. She reports that she does not drink alcohol or use drugs.  Allergies  Allergen Reactions   Fish Allergy Itching and Rash    Family History  Problem Relation Age of Onset   Diabetes  Mellitus II Maternal Grandmother     Prior to Admission medications   Medication Sig Start Date End Date Taking? Authorizing Provider  albuterol (PROVENTIL HFA;VENTOLIN HFA) 108 (90 Base) MCG/ACT inhaler Inhale 1-2 puffs into the lungs every 6 (six) hours as needed for wheezing or shortness of breath.   Yes [provider]  acetaminophen (TYLENOL) 500 MG tablet Take 1 tablet (500 mg total) by mouth every 6 (six) hours as needed. Patient not taking: Reported on 11/09/2018 02/26/17   Rodell Perna A, PA-C  ibuprofen (ADVIL,MOTRIN) 600 MG tablet Take 1 tablet (600 mg total) by mouth every 6 (six) hours as needed. Patient not taking: Reported on 11/09/2018 02/26/17   Rodell Perna A, PA-C  metoCLOPramide (REGLAN) 10 MG tablet Take 1 tablet (10 mg total) every 6 (six) hours as needed by mouth for nausea or vomiting. Patient not taking: Reported on 11/09/2018 04/18/17   Orlie Dakin, MD  ondansetron (ZOFRAN ODT) 4 MG disintegrating tablet Take 1 tablet (4 mg total) by mouth every 8 (eight) hours as needed for nausea or vomiting. Patient not taking: Reported on 11/09/2018 02/26/17   Rodell Perna A, PA-C  oseltamivir (TAMIFLU) 75 MG capsule Take 1 capsule (75 mg total) by mouth every 12 (twelve) hours. Patient not taking: Reported on 11/09/2018 07/31/17   Fransico Meadow, PA-C  predniSONE (DELTASONE) 10 MG tablet 5,4,3,2,1 taper Patient not taking: Reported  on 11/09/2018 07/31/17   Elson Areas, PA-C    Physical Exam: Vitals:   03/03/19 0530 03/03/19 0600 03/03/19 0630 03/03/19 0700  BP: (!) 169/99 (!) 169/107 (!) 151/95 (!) 153/102  Pulse: 99 (!) 102 (!) 102 100  Resp: (!) 23  (!) 25   Temp:      TempSrc:      SpO2: 95% 97% 96% 95%  Weight:      Height:        Constitutional: 28 y.o. female NAD, calm, comfortable Vitals:   03/03/19 0530 03/03/19 0600 03/03/19 0630 03/03/19 0700  BP: (!) 169/99 (!) 169/107 (!) 151/95 (!) 153/102  Pulse: 99 (!) 102 (!) 102 100  Resp: (!) 23  (!) 25     Temp:      TempSrc:      SpO2: 95% 97% 96% 95%  Weight:      Height:       General: 28 y.o. female resting in bed in NAD Eyes: PERRL, normal sclera ENMT: Nares patent w/o discharge, orophaynx clear, dentition normal, ears w/o discharge/lesions/ulcers Cardiovascular: RRR, +S1, S2, no m/g/r, equal pulses throughout Respiratory: some wheeze noted; decreased at bases, normal WOB GI: BS+, ND, LLQ TTP, no masses noted, no organomegaly noted, obese MSK: No e/c/c Skin: No rashes, bruises, ulcerations noted Neuro: A&O x 3, no focal deficits Psyc: Appropriate interaction and affect, calm/cooperative    Labs on Admission: I have personally reviewed following labs and imaging studies  CBC: Recent Labs  Lab 03/03/19 0330 03/03/19 0634  WBC 6.8  --   NEUTROABS 3.9  --   HGB 11.6* 12.9  HCT 39.2 38.0  MCV 94.2  --   PLT 282  --    Basic Metabolic Panel: Recent Labs  Lab 03/03/19 0634  NA 139  K 3.9  CL 98  GLUCOSE 218*  BUN 9  CREATININE 0.60   GFR: Estimated Creatinine Clearance: 172.6 mL/min (by C-G formula based on SCr of 0.6 mg/dL). Liver Function Tests: Recent Labs  Lab 03/03/19 0620  AST 37  ALT 37  ALKPHOS 69  BILITOT 0.2*  PROT 7.6  ALBUMIN 3.2*   Recent Labs  Lab 03/03/19 0620  LIPASE 29   No results for input(s): AMMONIA in the last 168 hours. Coagulation Profile: No results for input(s): INR, PROTIME in the last 168 hours. Cardiac Enzymes: No results for input(s): CKTOTAL, CKMB, CKMBINDEX, TROPONINI in the last 168 hours. BNP (last 3 results) No results for input(s): PROBNP in the last 8760 hours. HbA1C: No results for input(s): HGBA1C in the last 72 hours. CBG: No results for input(s): GLUCAP in the last 168 hours. Lipid Profile: No results for input(s): CHOL, HDL, LDLCALC, TRIG, CHOLHDL, LDLDIRECT in the last 72 hours. Thyroid Function Tests: No results for input(s): TSH, T4TOTAL, FREET4, T3FREE, THYROIDAB in the last 72 hours. Anemia  Panel: No results for input(s): VITAMINB12, FOLATE, FERRITIN, TIBC, IRON, RETICCTPCT in the last 72 hours. Urine analysis:    Component Value Date/Time   COLORURINE YELLOW 03/03/2019 0620   APPEARANCEUR CLOUDY (A) 03/03/2019 0620   LABSPEC 1.031 (H) 03/03/2019 0620   PHURINE 5.0 03/03/2019 0620   GLUCOSEU 150 (A) 03/03/2019 0620   HGBUR NEGATIVE 03/03/2019 0620   BILIRUBINUR NEGATIVE 03/03/2019 0620   KETONESUR NEGATIVE 03/03/2019 0620   PROTEINUR >=300 (A) 03/03/2019 0620   UROBILINOGEN 0.2 09/04/2014 1001   NITRITE NEGATIVE 03/03/2019 0620   LEUKOCYTESUR TRACE (A) 03/03/2019 0620    Radiological Exams on Admission:  Dg Chest Portable 1 View  Result Date: 03/03/2019 CLINICAL DATA:  Shortness of breath and chest pain EXAM: PORTABLE CHEST 1 VIEW COMPARISON:  07/31/2017 FINDINGS: Cardiac shadow is mildly prominent accentuated by the portable technique. Lungs are well aerated. Some patchy opacities are noted in the bases bilaterally. No bony abnormality is seen. IMPRESSION: Patchy opacities in the bases bilaterally. Electronically Signed   By: Alcide CleverMark  Lukens M.D.   On: 03/03/2019 03:45   Ct Renal Stone Study  Result Date: 03/03/2019 CLINICAL DATA:  Acute generalized abdominal pain. EXAM: CT ABDOMEN AND PELVIS WITHOUT CONTRAST TECHNIQUE: Multidetector CT imaging of the abdomen and pelvis was performed following the standard protocol without IV contrast. COMPARISON:  None. FINDINGS: Lower chest: No acute abnormality. Hepatobiliary: No focal liver abnormality is seen. Status post cholecystectomy. No biliary dilatation. Pancreas: Unremarkable. No pancreatic ductal dilatation or surrounding inflammatory changes. Spleen: Normal in size without focal abnormality. Adrenals/Urinary Tract: Adrenal glands are unremarkable. Kidneys are normal, without renal calculi, focal lesion, or hydronephrosis. Bladder is unremarkable. Stomach/Bowel: The stomach appears normal. There is no evidence of bowel obstruction  or inflammation. Status post appendectomy. Vascular/Lymphatic: No significant vascular findings are present. No enlarged abdominal or pelvic lymph nodes. Reproductive: Uterus and bilateral adnexa are unremarkable. Other: No abdominal wall hernia or abnormality. No abdominopelvic ascites. Musculoskeletal: Bilateral L5 spondylolysis is noted. No other significant osseous abnormality seen. IMPRESSION: Bilateral L5 spondylolysis. No acute abnormality seen in the abdomen or pelvis. Electronically Signed   By: Lupita RaiderJames  Green Jr M.D.   On: 03/03/2019 07:57    EKG: Independently reviewed.  Assessment/Plan Principal Problem:   CAP (community acquired pneumonia) Active Problems:   Extreme obesity   Asthma   Abdominal pain   HTN (hypertension)   Hyperglycemia   Proteinuria   CAP     - CXR with patchy opacities in the bases     - no sick contacts     - COVID negative     - rocephin, zithro     - O2 support as needed  Abdominal pain     - pain is still present this AM     - CT negative, labs ok     - toradol helped     - c/o of some nausea earlier; antiemetics     - let's see if she can eat     - PRN IV toradol  Extreme obesity     - encourage diet/exercise  HTN Proteinuria     - let's start lisinopril; explained risks/benefits; she can get this in a 90-day supply free from [employer]     - will need PCP for follow up of proteinuria; consult CM for PCP  Hyperglycemia     - check A1c     - carb control diet  Asthma     - continue PRN albuterol  Pyruia     - she is being covered with rocephin for CAP right now; monitor  DVT prophylaxis: lovenox  Code Status: FULL  Family Communication: None at bedside  Disposition Plan: TBD  Consults called: None  Admission status: Observation. Pain still not controlled.    Teddy Spikeyrone A Erroll Wilbourne DO [facility] Pager [phone]  If 7PM-7AM, please contact night-coverage [email or web address] Password [email or web address]  03/03/2019, 9:04 AM

## 2019-03-03 NOTE — ED Notes (Addendum)
Pt is requesting her iv be moved because it is "beginning to sting" and she is not able to bend her arm.

## 2019-03-03 NOTE — Plan of Care (Signed)
  Problem: Education: Goal: Knowledge of General Education information will improve Description: Including pain rating scale, medication(s)/side effects and non-pharmacologic comfort measures 03/03/2019 1801 by Suzzanne Cloud, RN Outcome: Progressing 03/03/2019 1749 by Suzzanne Cloud, RN Outcome: Progressing   Problem: Clinical Measurements: Goal: Will remain free from infection 03/03/2019 1801 by Suzzanne Cloud, RN Outcome: Progressing 03/03/2019 1749 by Suzzanne Cloud, RN Outcome: Progressing   Problem: Clinical Measurements: Goal: Respiratory complications will improve 03/03/2019 1801 by Suzzanne Cloud, RN Outcome: Progressing 03/03/2019 1749 by Suzzanne Cloud, RN Outcome: Progressing

## 2019-03-03 NOTE — ED Notes (Signed)
ED TO INPATIENT HANDOFF REPORT  Name/Age/Gender Yvette Gibbs 28 y.o. female  Code Status Code Status History    Date Active Date Inactive Code Status Order ID Comments User Context   09/07/2015 0403 09/10/2015 1521 Full Code [id number]  Lorretta HarpNiu, Xilin, MD ED   09/01/2015 2218 09/05/2015 1858 Full Code [id number]  Eduard ClosKakrakandy, Arshad N, MD Inpatient   09/04/2014 1854 09/06/2014 1902 Full Code [id number]  Barnetta Chapelsborne, Kelly, PA-C Inpatient   Advance Care Planning Activity      Home/SNF/Other Home  Chief Complaint Abdominal Pain  Level of Care/Admitting Diagnosis ED Disposition    ED Disposition Condition Comment   Admit  [facility] [[id number]  Level of Care: Med-Surg [16]  Covid Evaluation: Confirmed COVID Negative  Diagnosis: Community acquired pneumonia [[id number]][[id number]]  Admitting Physician: Teddy SpikeKYLE, TYRONE A [[id number]]  Attending Physician: Teddy SpikeKYLE, TYRONE A [[id number]]  PT Class (Do Not Modify): Observation [104]  PT Acc Code (Do Not Modify): Observation [10022]       Medical History Past Medical History:  Diagnosis Date  . Asthma   . Depression   . Diabetes mellitus type 2, uncontrolled ([facility])   . Extreme obesity   . Family history of adverse reaction to anesthesia    " my grandmother had an allergic reaction  . Mental disorder   . Obesity   . OSA (obstructive sleep apnea)     Allergies Allergies  Allergen Reactions  . Fish Allergy Itching and Rash    IV Location/Drains/Wounds Patient Lines/Drains/Airways Status   Active Line/Drains/Airways    Name:   Placement date:   Placement time:   Site:   Days:   Peripheral IV 03/03/19 Right Antecubital   03/03/19    0616    Antecubital   less than 1   Peripheral IV 03/03/19 Left;Anterior Forearm   03/03/19    0653    Forearm   less than 1          Labs/Imaging Results for orders placed or performed during the hospital encounter of 03/03/19 (from the past 48 hour(s))  CBC with Differential/Platelet      Status: Abnormal   Collection Time: 03/03/19  3:30 AM  Result Value Ref Range   WBC 6.8 4.0 - 10.5 K/uL   RBC 4.16 3.87 - 5.11 MIL/uL   Hemoglobin 11.6 (L) 12.0 - 15.0 g/dL   HCT 66.439.2 40.336.0 - 47.446.0 %   MCV 94.2 80.0 - 100.0 fL   MCH 27.9 26.0 - 34.0 pg   MCHC 29.6 (L) 30.0 - 36.0 g/dL   RDW 25.913.2 56.311.5 - 87.515.5 %   Platelets 282 150 - 400 K/uL   nRBC 0.0 0.0 - 0.2 %   Neutrophils Relative % 58 %   Neutro Abs 3.9 1.7 - 7.7 K/uL   Lymphocytes Relative 26 %   Lymphs Abs 1.8 0.7 - 4.0 K/uL   Monocytes Relative 13 %   Monocytes Absolute 0.9 0.1 - 1.0 K/uL   Eosinophils Relative 3 %   Eosinophils Absolute 0.2 0.0 - 0.5 K/uL   Basophils Relative 0 %   Basophils Absolute 0.0 0.0 - 0.1 K/uL   Immature Granulocytes 0 %   Abs Immature Granulocytes 0.03 0.00 - 0.07 K/uL    Comment: Performed at [facility], [address] [place], [place] [address]  SARS Coronavirus 2 [facility](Hospital order, [facility]) Nasopharyngeal Nasopharyngeal Swab     Status: None   Collection Time: 03/03/19  3:30 AM  Specimen: Nasopharyngeal Swab  Result Value Ref Range   SARS Coronavirus 2 NEGATIVE NEGATIVE    Comment: (NOTE) If result is NEGATIVE SARS-CoV-2 target nucleic acids are NOT DETECTED. The SARS-CoV-2 RNA is generally detectable in upper and lower  respiratory specimens during the acute phase of infection. The lowest  concentration of SARS-CoV-2 viral copies this assay can detect is 250  copies / mL. A negative result does not preclude SARS-CoV-2 infection  and should not be used as the sole basis for treatment or other  patient management decisions.  A negative result may occur with  improper specimen collection / handling, submission of specimen other  than nasopharyngeal swab, presence of viral mutation(s) within the  areas targeted by this assay, and inadequate number of viral copies  (<250 copies / mL). A negative result must be combined with clinical   observations, patient history, and epidemiological information. If result is POSITIVE SARS-CoV-2 target nucleic acids are DETECTED. The SARS-CoV-2 RNA is generally detectable in upper and lower  respiratory specimens dur ing the acute phase of infection.  Positive  results are indicative of active infection with SARS-CoV-2.  Clinical  correlation with patient history and other diagnostic information is  necessary to determine patient infection status.  Positive results do  not rule out bacterial infection or co-infection with other viruses. If result is PRESUMPTIVE POSTIVE SARS-CoV-2 nucleic acids MAY BE PRESENT.   A presumptive positive result was obtained on the submitted specimen  and confirmed on repeat testing.  While 2019 novel coronavirus  (SARS-CoV-2) nucleic acids may be present in the submitted sample  additional confirmatory testing may be necessary for epidemiological  and / or clinical management purposes  to differentiate between  SARS-CoV-2 and other Sarbecovirus currently known to infect humans.  If clinically indicated additional testing with an alternate test  methodology [phone]) is advised. The SARS-CoV-2 RNA is generally  detectable in upper and lower respiratory sp ecimens during the acute  phase of infection. The expected result is Negative. Fact Sheet for Patients:  StrictlyIdeas.no Fact Sheet for Healthcare Providers: BankingDealers.co.za This test is not yet approved or cleared by the [place] FDA and has been authorized for detection and/or diagnosis of SARS-CoV-2 by FDA under an Emergency Use Authorization (EUA).  This EUA will remain in effect (meaning this test can be used) for the duration of the COVID-19 declaration under Section 564(b)(1) of the Act, 21 U.S.C. section 360bbb-3(b)(1), unless the authorization is terminated or revoked sooner. Performed at [facility], [place]  [address] [place], [place] [address]   Hepatic function panel     Status: Abnormal   Collection Time: 03/03/19  6:20 AM  Result Value Ref Range   Total Protein 7.6 6.5 - 8.1 g/dL   Albumin 3.2 (L) 3.5 - 5.0 g/dL   AST 37 15 - 41 U/L   ALT 37 0 - 44 U/L   Alkaline Phosphatase 69 38 - 126 U/L   Total Bilirubin 0.2 (L) 0.3 - 1.2 mg/dL   Bilirubin, Direct <0.1 0.0 - 0.2 mg/dL   Indirect Bilirubin NOT CALCULATED 0.3 - 0.9 mg/dL    Comment: Performed at [facility], [place] [address], [place], [place] [address]  Lipase, blood     Status: None   Collection Time: 03/03/19  6:20 AM  Result Value Ref Range   Lipase 29 11 - 51 U/L    Comment: Performed at [facility], [place] [address] [place], [place] [address]  Urinalysis, Routine w reflex microscopic     Status: Abnormal   Collection Time: 03/03/19  6:20 AM  Result Value Ref Range   Color, Urine YELLOW YELLOW   APPearance CLOUDY (A) CLEAR   Specific Gravity, Urine 1.031 (H) 1.005 - 1.030   pH 5.0 5.0 - 8.0   Glucose, UA 150 (A) NEGATIVE mg/dL   Hgb urine dipstick NEGATIVE NEGATIVE   Bilirubin Urine NEGATIVE NEGATIVE   Ketones, ur NEGATIVE NEGATIVE mg/dL   Protein, ur >=494 (A) NEGATIVE mg/dL   Nitrite NEGATIVE NEGATIVE   Leukocytes,Ua TRACE (A) NEGATIVE   RBC / HPF 6-10 0 - 5 RBC/hpf   WBC, UA 11-20 0 - 5 WBC/hpf   Bacteria, UA MANY (A) NONE SEEN   Squamous Epithelial / LPF 11-20 0 - 5   Mucus PRESENT    Hyaline Casts, UA PRESENT     Comment: Performed at [facility], [address], [place], [place] [address]  I-Stat Beta hCG blood, ED ([facility], [facility], [facility] only)     Status: None   Collection Time: 03/03/19  6:32 AM  Result Value Ref Range   I-stat hCG, quantitative <5.0 <5 mIU/mL   Comment 3            Comment:   GEST. AGE      CONC.  (mIU/mL)   <=1 WEEK        5 - 50     2 WEEKS       50 - 500     3 WEEKS       100 - 10,000     4 WEEKS     1,000 - 30,000        FEMALE AND  NON-PREGNANT FEMALE:     LESS THAN 5 mIU/mL   I-stat chem 8, ED (not at [facility] or [facility])     Status: Abnormal   Collection Time: 03/03/19  6:34 AM  Result Value Ref Range   Sodium 139 135 - 145 mmol/L   Potassium 3.9 3.5 - 5.1 mmol/L   Chloride 98 98 - 111 mmol/L   BUN 9 6 - 20 mg/dL   Creatinine, Ser 9.16 0.44 - 1.00 mg/dL   Glucose, Bld 384 (H) 70 - 99 mg/dL   Calcium, Ion 6.65 9.93 - 1.40 mmol/L   TCO2 30 22 - 32 mmol/L   Hemoglobin 12.9 12.0 - 15.0 g/dL   HCT 57.0 17.7 - 93.9 %   Dg Chest Portable 1 View  Result Date: 03/03/2019 CLINICAL DATA:  Shortness of breath and chest pain EXAM: PORTABLE CHEST 1 VIEW COMPARISON:  07/31/2017 FINDINGS: Cardiac shadow is mildly prominent accentuated by the portable technique. Lungs are well aerated. Some patchy opacities are noted in the bases bilaterally. No bony abnormality is seen. IMPRESSION: Patchy opacities in the bases bilaterally. Electronically Signed   By: Alcide Clever M.D.   On: 03/03/2019 03:45   Ct Renal Stone Study  Result Date: 03/03/2019 CLINICAL DATA:  Acute generalized abdominal pain. EXAM: CT ABDOMEN AND PELVIS WITHOUT CONTRAST TECHNIQUE: Multidetector CT imaging of the abdomen and pelvis was performed following the standard protocol without IV contrast. COMPARISON:  None. FINDINGS: Lower chest: No acute abnormality. Hepatobiliary: No focal liver abnormality is seen. Status post cholecystectomy. No biliary dilatation. Pancreas: Unremarkable. No pancreatic ductal dilatation or surrounding inflammatory changes. Spleen: Normal in size without focal abnormality. Adrenals/Urinary Tract: Adrenal glands are unremarkable. Kidneys are normal, without renal calculi, focal lesion, or hydronephrosis. Bladder is unremarkable.  Stomach/Bowel: The stomach appears normal. There is no evidence of bowel obstruction or inflammation. Status post appendectomy. Vascular/Lymphatic: No significant vascular findings are present. No enlarged abdominal or pelvic  lymph nodes. Reproductive: Uterus and bilateral adnexa are unremarkable. Other: No abdominal wall hernia or abnormality. No abdominopelvic ascites. Musculoskeletal: Bilateral L5 spondylolysis is noted. No other significant osseous abnormality seen. IMPRESSION: Bilateral L5 spondylolysis. No acute abnormality seen in the abdomen or pelvis. Electronically Signed   By: Lupita RaiderJames  Green Jr M.D.   On: 03/03/2019 07:57    Pending Labs [employer]ed Labs (From admission, onward)    Start     Ordered   Signed and Held  HIV antibody (Routine Testing)  Once,   R     Signed and Held   Signed and Held  CBC  (enoxaparin (LOVENOX)    CrCl >/= 30 ml/min)  Once,   R    Comments: Baseline for enoxaparin therapy IF NOT ALREADY DRAWN.  Notify MD if PLT < 100 K.    Signed and Held   Signed and Held  Creatinine, serum  (enoxaparin (LOVENOX)    CrCl >/= 30 ml/min)  Once,   R    Comments: Baseline for enoxaparin therapy IF NOT ALREADY DRAWN.    Signed and Held   Signed and Held  Creatinine, serum  (enoxaparin (LOVENOX)    CrCl >/= 30 ml/min)  Weekly,   R    Comments: while on enoxaparin therapy    Signed and Held   Signed and Held  Comprehensive metabolic panel  Tomorrow morning,   R     Signed and Held   Signed and Held  CBC  Tomorrow morning,   R     Signed and Held   Signed and Held  Hemoglobin A1c  Once,   R     Signed and Held          Vitals/Pain Today's Vitals   03/03/19 0730 03/03/19 0900 03/03/19 0930 03/03/19 1000  BP: (!) 156/91 (!) 154/113 (!) 138/103 (!) 156/119  Pulse: 95 98 97 90  Resp: (!) 24 14 19 16   Temp:      TempSrc:      SpO2: 96% 95% 98% 96%  Weight:      Height:      PainSc:        Isolation Precautions No active isolations  Medications Medications  lisinopril (ZESTRIL) tablet 10 mg (has no administration in time range)  albuterol (VENTOLIN HFA) 108 (90 Base) MCG/ACT inhaler 1 puff (has no administration in time range)  albuterol (VENTOLIN HFA) 108 (90 Base) MCG/ACT  inhaler 8 puff (8 puffs Inhalation Given 03/03/19 0615)  ketorolac (TORADOL) 30 MG/ML injection 15 mg (15 mg Intravenous Given 03/03/19 0615)  cefTRIAXone (ROCEPHIN) 1 g in sodium chloride 0.9 % 100 mL IVPB (0 g Intravenous Stopped 03/03/19 0730)  azithromycin (ZITHROMAX) 500 mg in sodium chloride 0.9 % 250 mL IVPB (0 mg Intravenous Stopped 03/03/19 0927)  alum & mag hydroxide-simeth (MAALOX/MYLANTA) 200-200-20 MG/5ML suspension 30 mL (30 mLs Oral Given 03/03/19 0616)    And  lidocaine (XYLOCAINE) 2 % viscous mouth solution 15 mL (15 mLs Oral Given 03/03/19 0616)    Mobility walks

## 2019-03-03 NOTE — Progress Notes (Signed)
Assumed care of pt from off going RN. Condition stable. Cont with current plan of care 

## 2019-03-03 NOTE — ED Provider Notes (Addendum)
[place] DEPT Provider Note   CSN: [id number] Arrival date & time: 03/03/19  0308     History   Chief Complaint Chief Complaint  Patient presents with  . Abdominal Pain    HPI Tyara Jemison is a 28 y.o. female.     The history is provided by the patient.  Abdominal Pain Pain location:  Generalized Pain quality: aching   Pain radiates to:  Does not radiate Pain severity:  Moderate Onset quality:  Sudden Timing:  Constant Progression:  Unchanged Chronicity:  New Context: recent illness   Context: not alcohol use, not awakening from sleep, not diet changes, not eating, not laxative use, not medication withdrawal, not previous surgeries, not recent sexual activity, not recent travel, not retching, not sick contacts, not suspicious food intake and not trauma   Relieved by:  Nothing Worsened by:  Nothing Ineffective treatments:  None tried Associated symptoms: cough, diarrhea, fatigue and fever   Associated symptoms: no anorexia, no chest pain, no dysuria, no nausea, no shortness of breath, no sore throat and no vomiting   Associated symptoms comment:  Myalgias and loss of taste Cough:    Cough characteristics:  Non-productive   Severity:  Moderate   Onset quality:  Gradual   Duration:  1 day   Timing:  Sporadic   Progression:  Unchanged   Chronicity:  New Risk factors: obesity   Risk factors: has not had multiple surgeries     Past Medical History:  Diagnosis Date  . Asthma   . Depression   . Diabetes mellitus type 2, uncontrolled ([place])   . Extreme obesity   . Family history of adverse reaction to anesthesia    " my grandmother had an allergic reaction  . Mental disorder   . Obesity   . OSA (obstructive sleep apnea)     Patient Active Problem List   Diagnosis Date Noted  . HCAP (healthcare-associated pneumonia) 09/07/2015  . Sepsis ([place]) 09/07/2015  . Asthma 09/07/2015  . Pain in the chest   . Dyspnea 09/01/2015  .  Pleuritic chest pain 09/01/2015  . Morbid obesity ([place]) 09/01/2015  . Asthma exacerbation 09/01/2015  . OSA (obstructive sleep apnea) 09/01/2015  . Influenza A 09/01/2015  . Extreme obesity 09/06/2014  . Diabetes mellitus type 2, uncontrolled ([place]) 09/06/2014  . Biliary colic 40/98/1191    Past Surgical History:  Procedure Laterality Date  . APPENDECTOMY    . CHOLECYSTECTOMY N/A 09/05/2014   Procedure: LAPAROSCOPIC CHOLECYSTECTOMY;  Surgeon: Ralene Ok, MD;  Location: [facility];  Service: General;  Laterality: N/A;  . TONSILLECTOMY       OB History   No obstetric history on file.      Home Medications    Prior to Admission medications   Medication Sig Start Date End Date Taking? Authorizing Provider  albuterol (PROVENTIL HFA;VENTOLIN HFA) 108 (90 Base) MCG/ACT inhaler Inhale 1-2 puffs into the lungs every 6 (six) hours as needed for wheezing or shortness of breath.   Yes [provider]  acetaminophen (TYLENOL) 500 MG tablet Take 1 tablet (500 mg total) by mouth every 6 (six) hours as needed. Patient not taking: Reported on 11/09/2018 02/26/17   Rodell Perna A, PA-C  ibuprofen (ADVIL,MOTRIN) 600 MG tablet Take 1 tablet (600 mg total) by mouth every 6 (six) hours as needed. Patient not taking: Reported on 11/09/2018 02/26/17   Rodell Perna A, PA-C  metoCLOPramide (REGLAN) 10 MG tablet Take 1 tablet (10 mg total) every 6 (  six) hours as needed by mouth for nausea or vomiting. Patient not taking: Reported on 11/09/2018 04/18/17   Doug Sou, MD  ondansetron (ZOFRAN ODT) 4 MG disintegrating tablet Take 1 tablet (4 mg total) by mouth every 8 (eight) hours as needed for nausea or vomiting. Patient not taking: Reported on 11/09/2018 02/26/17   Michela Pitcher A, PA-C  oseltamivir (TAMIFLU) 75 MG capsule Take 1 capsule (75 mg total) by mouth every 12 (twelve) hours. Patient not taking: Reported on 11/09/2018 07/31/17   Elson Areas, PA-C  predniSONE (DELTASONE) 10 MG tablet 0,5,1,1,0  taper Patient not taking: Reported on 11/09/2018 07/31/17   Elson Areas, PA-C    Family History Family History  Problem Relation Age of Onset  . Diabetes Mellitus II Maternal Grandmother     Social History Social History   Tobacco Use  . Smoking status: Never Smoker  . Smokeless tobacco: Never Used  Substance Use Topics  . Alcohol use: No  . Drug use: No     Allergies   Fish allergy   Review of Systems Review of Systems  Constitutional: Positive for fatigue and fever.  HENT: Negative for congestion, sore throat, tinnitus, trouble swallowing and voice change.   Eyes: Negative for visual disturbance.  Respiratory: Positive for cough. Negative for shortness of breath.   Cardiovascular: Negative for chest pain, palpitations and leg swelling.  Gastrointestinal: Positive for abdominal pain and diarrhea. Negative for anorexia, nausea and vomiting.  Genitourinary: Negative for difficulty urinating and dysuria.  Musculoskeletal: Negative for neck pain and neck stiffness.  Neurological: Negative for weakness and numbness.  Psychiatric/Behavioral: Negative for agitation.  All other systems reviewed and are negative.    Physical Exam Updated Vital Signs BP (!) 169/99   Pulse 99   Temp 99 F (37.2 C) (Oral)   Resp (!) 23   Ht 5\' 2"  (1.575 m)   Wt (!) 181.4 kg Comment: Pt does not know  SpO2 95%   BMI 73.16 kg/m   Physical Exam Vitals signs and nursing note reviewed.  Constitutional:      Appearance: She is obese.  HENT:     Head: Normocephalic and atraumatic.     Nose: Nose normal.  Eyes:     Conjunctiva/sclera: Conjunctivae normal.     Pupils: Pupils are equal, round, and reactive to light.  Neck:     Musculoskeletal: Normal range of motion and neck supple. No neck rigidity.  Cardiovascular:     Rate and Rhythm: Regular rhythm. Tachycardia present.     Pulses: Normal pulses.     Heart sounds: Normal heart sounds.  Pulmonary:     Effort: Pulmonary effort  is normal. Tachypnea present.     Breath sounds: Wheezing and rales present.  Abdominal:     General: Abdomen is flat. Bowel sounds are normal. There is no distension.     Palpations: Abdomen is soft. There is no mass.     Tenderness: There is no abdominal tenderness. There is no guarding or rebound.     Hernia: No hernia is present.  Musculoskeletal: Normal range of motion.  Skin:    General: Skin is warm and dry.     Capillary Refill: Capillary refill takes less than 2 seconds.  Neurological:     General: No focal deficit present.     Mental Status: She is alert and oriented to person, place, and time.     Deep Tendon Reflexes: Reflexes normal.  Psychiatric:  Mood and Affect: Mood normal.        Behavior: Behavior normal.      ED Treatments / Results  Labs (all labs ordered are listed, but only abnormal results are displayed) Results for orders placed or performed during the hospital encounter of 03/03/19  SARS Coronavirus 2 [facility](Hospital order, Performed in [facility] hospital lab) Nasopharyngeal Nasopharyngeal Swab   Specimen: Nasopharyngeal Swab  Result Value Ref Range   SARS Coronavirus 2 NEGATIVE NEGATIVE  CBC with Differential/Platelet  Result Value Ref Range   WBC 6.8 4.0 - 10.5 K/uL   RBC 4.16 3.87 - 5.11 MIL/uL   Hemoglobin 11.6 (L) 12.0 - 15.0 g/dL   HCT 16.139.2 09.636.0 - 04.546.0 %   MCV 94.2 80.0 - 100.0 fL   MCH 27.9 26.0 - 34.0 pg   MCHC 29.6 (L) 30.0 - 36.0 g/dL   RDW 40.913.2 81.111.5 - 91.415.5 %   Platelets 282 150 - 400 K/uL   nRBC 0.0 0.0 - 0.2 %   Neutrophils Relative % 58 %   Neutro Abs 3.9 1.7 - 7.7 K/uL   Lymphocytes Relative 26 %   Lymphs Abs 1.8 0.7 - 4.0 K/uL   Monocytes Relative 13 %   Monocytes Absolute 0.9 0.1 - 1.0 K/uL   Eosinophils Relative 3 %   Eosinophils Absolute 0.2 0.0 - 0.5 K/uL   Basophils Relative 0 %   Basophils Absolute 0.0 0.0 - 0.1 K/uL   Immature Granulocytes 0 %   Abs Immature Granulocytes 0.03 0.00 - 0.07 K/uL  I-stat chem 8, ED  (not at [facility] or [facility])  Result Value Ref Range   Sodium 139 135 - 145 mmol/L   Potassium 3.9 3.5 - 5.1 mmol/L   Chloride 98 98 - 111 mmol/L   BUN 9 6 - 20 mg/dL   Creatinine, Ser 7.820.60 0.44 - 1.00 mg/dL   Glucose, Bld 956218 (H) 70 - 99 mg/dL   Calcium, Ion 2.131.27 0.861.15 - 1.40 mmol/L   TCO2 30 22 - 32 mmol/L   Hemoglobin 12.9 12.0 - 15.0 g/dL   HCT 57.838.0 46.936.0 - 62.946.0 %  I-Stat Beta hCG blood, ED ([facility], [facility], [facility] only)  Result Value Ref Range   I-stat hCG, quantitative <5.0 <5 mIU/mL   Comment 3           Dg Chest Portable 1 View  Result Date: 03/03/2019 CLINICAL DATA:  Shortness of breath and chest pain EXAM: PORTABLE CHEST 1 VIEW COMPARISON:  07/31/2017 FINDINGS: Cardiac shadow is mildly prominent accentuated by the portable technique. Lungs are well aerated. Some patchy opacities are noted in the bases bilaterally. No bony abnormality is seen. IMPRESSION: Patchy opacities in the bases bilaterally. Electronically Signed   By: Alcide CleverMark  Lukens M.D.   On: 03/03/2019 03:45    EKG EKG Interpretation  Date/Time:  Friday March 03 2019 03:21:52 EDT Ventricular Rate:  106 PR Interval:    QRS Duration: 112 QT Interval:  363 QTC Calculation: 482 R Axis:   -15 Text Interpretation:  Sinus tachycardia Incomplete right bundle branch block Confirmed by Nicanor AlconPalumbo, Koston Hennes ([id number]) on 03/03/2019 3:48:50 AM   Radiology Dg Chest Portable 1 View  Result Date: 03/03/2019 CLINICAL DATA:  Shortness of breath and chest pain EXAM: PORTABLE CHEST 1 VIEW COMPARISON:  07/31/2017 FINDINGS: Cardiac shadow is mildly prominent accentuated by the portable technique. Lungs are well aerated. Some patchy opacities are noted in the bases bilaterally. No bony abnormality is seen. IMPRESSION: Patchy opacities in the bases bilaterally.  Electronically Signed   By: Alcide CleverMark  Lukens M.D.   On: 03/03/2019 03:45    Procedures Procedures (including critical care time)  Medications Ordered in ED Medications  albuterol (VENTOLIN HFA) 108 (90  Base) MCG/ACT inhaler 8 puff (has no administration in time range)  ketorolac (TORADOL) 30 MG/ML injection 15 mg (has no administration in time range)  cefTRIAXone (ROCEPHIN) 1 g in sodium chloride 0.9 % 100 mL IVPB (has no administration in time range)  azithromycin (ZITHROMAX) 500 mg in sodium chloride 0.9 % 250 mL IVPB (has no administration in time range)  alum & mag hydroxide-simeth (MAALOX/MYLANTA) 200-200-20 MG/5ML suspension 30 mL (has no administration in time range)    And  lidocaine (XYLOCAINE) 2 % viscous mouth solution 15 mL (has no administration in time range)     Patient has abdominal pain and Cough and covid like symptoms have started CAP antibiotics and patient will need admission when labs and CT result.  Signed out to Dr. Charm BargesButler at 7 am pending labs/CT and dispo  Final Clinical Impressions(s) / ED Diagnoses          Mckynlie Vanderslice, MD 03/03/19 [id number]

## 2019-03-03 NOTE — ED Triage Notes (Signed)
Per EMS - Pt coming from home with CC of abd pain x 1 hour, acute onset, sharp generalized pain. Tender upon palpation and slight nausea. Pt complains of loss of taste x 2 days along with "flu like symptoms".   140 palp 96 HR 22 R  98% RA  9/10 pain (abd) 99.9

## 2019-03-03 NOTE — Progress Notes (Signed)
MD notified. Report given to on coming RN. Sat 100%. Okay to leave pt on 2 liters of oxygen

## 2019-03-04 DIAGNOSIS — E1165 Type 2 diabetes mellitus with hyperglycemia: Secondary | ICD-10-CM

## 2019-03-04 DIAGNOSIS — E119 Type 2 diabetes mellitus without complications: Secondary | ICD-10-CM | POA: Diagnosis present

## 2019-03-04 DIAGNOSIS — J181 Lobar pneumonia, unspecified organism: Secondary | ICD-10-CM

## 2019-03-04 DIAGNOSIS — I10 Essential (primary) hypertension: Secondary | ICD-10-CM

## 2019-03-04 LAB — CBC
HCT: 37.8 % (ref 36.0–46.0)
Hemoglobin: 11.1 g/dL — ABNORMAL LOW (ref 12.0–15.0)
MCH: 28 pg (ref 26.0–34.0)
MCHC: 29.4 g/dL — ABNORMAL LOW (ref 30.0–36.0)
MCV: 95.5 fL (ref 80.0–100.0)
Platelets: 258 10*3/uL (ref 150–400)
RBC: 3.96 MIL/uL (ref 3.87–5.11)
RDW: 13.2 % (ref 11.5–15.5)
WBC: 7 10*3/uL (ref 4.0–10.5)
nRBC: 0 % (ref 0.0–0.2)

## 2019-03-04 LAB — COMPREHENSIVE METABOLIC PANEL
ALT: 38 U/L (ref 0–44)
AST: 43 U/L — ABNORMAL HIGH (ref 15–41)
Albumin: 3.1 g/dL — ABNORMAL LOW (ref 3.5–5.0)
Alkaline Phosphatase: 60 U/L (ref 38–126)
Anion gap: 11 (ref 5–15)
BUN: 9 mg/dL (ref 6–20)
CO2: 28 mmol/L (ref 22–32)
Calcium: 9 mg/dL (ref 8.9–10.3)
Chloride: 99 mmol/L (ref 98–111)
Creatinine, Ser: 0.66 mg/dL (ref 0.44–1.00)
GFR calc Af Amer: >60 mL/min (ref >60)
GFR calc non Af Amer: >60 mL/min (ref >60)
Glucose, Bld: 159 mg/dL — ABNORMAL HIGH (ref 70–99)
Potassium: 4.9 mmol/L (ref 3.5–5.1)
Sodium: 138 mmol/L (ref 135–145)
Total Bilirubin: 0.6 mg/dL (ref 0.3–1.2)
Total Protein: 7.6 g/dL (ref 6.5–8.1)

## 2019-03-04 LAB — HIV ANTIBODY (ROUTINE TESTING W REFLEX): HIV Screen 4th Generation wRfx: NONREACTIVE

## 2019-03-04 MED ORDER — SODIUM CHLORIDE 0.9 % IV SOLN
INTRAVENOUS | Status: DC | PRN
  Administered 2019-03-04: 250 mL via INTRAVENOUS

## 2019-03-04 MED ORDER — METFORMIN HCL 500 MG PO TABS: 500.0000 mg | ORAL_TABLET | Freq: Two times a day (BID) | ORAL | 0 refills | Status: DC

## 2019-03-04 MED ORDER — LISINOPRIL 10 MG PO TABS: 10.0000 mg | ORAL_TABLET | Freq: Every day | ORAL | 0 refills | Status: DC

## 2019-03-04 MED ORDER — LEVOFLOXACIN 750 MG PO TABS: 750.0000 mg | ORAL_TABLET | Freq: Every day | ORAL | 0 refills | Status: AC

## 2019-03-04 MED ORDER — SODIUM CHLORIDE 0.9% FLUSH: 10.0000 mL | INTRAVENOUS | Status: DC | PRN

## 2019-03-04 MED ORDER — GUAIFENESIN-DM 100-10 MG/5ML PO SYRP: 5.0000 mL | ORAL_SOLUTION | ORAL | 0 refills | Status: DC | PRN

## 2019-03-04 NOTE — Progress Notes (Signed)
Pt given bed bath by this SRN, [place], and Jose SRN. Pt able to follow commands and ambulate between bed and recliner. Pt dressed in own clothes after bed bath. Pt verbalized that she felt better after bed bath.

## 2019-03-04 NOTE — Progress Notes (Signed)
Pt noted to have audible wheezing not clearing with repositioning , deep breathing denies diff breathing - neb tx provided

## 2019-03-04 NOTE — TOC Initial Note (Signed)
Transition of Care [facility](TOC) - Initial/Assessment Note    Patient Details  Name: Yvette GentryChudasama Wiltsey MRN: [id number] Date of Birth: [birth year]  Transition of Care (TOC) CM/SW Contact:    Armanda Heritageorres, Amelia Burgard Malkina, RN Phone Number: 03/04/2019, 10:56 AM  Clinical Narrative:    CM spoke with patient regarding need for pcp. CM provided patient with information about [facility]. Contact information placed on AVS. Patient to contact clinic Monday morning to establish pcp.                Expected Discharge Plan: Home/Self Care Barriers to Discharge: No Barriers Identified   Patient Goals and CMS Choice Patient states their goals for this hospitalization and ongoing recovery are:: to go home      Expected Discharge Plan and Services Expected Discharge Plan: Home/Self Care   Discharge Planning Services: CM Consult   Living arrangements for the past 2 months: Apartment Expected Discharge Date: 03/04/19               DME Arranged: N/A DME Agency: NA       HH Arranged: NA HH Agency: NA        Prior Living Arrangements/Services Living arrangements for the past 2 months: Apartment Lives with:: Relatives Patient language and need for interpreter reviewed:: Yes Do you feel safe going back to the place where you live?: Yes      Need for Family Participation in Patient Care: Yes (Comment) Care giver support system in place?: Yes (comment)   Criminal Activity/Legal Involvement Pertinent to Current Situation/Hospitalization: No - Comment as needed  Activities of Daily Living Home Assistive Devices/Equipment: None ADL Screening (condition at time of admission) Patient's cognitive ability adequate to safely complete daily activities?: Yes Is the patient deaf or have difficulty hearing?: No Does the patient have difficulty seeing, even when wearing glasses/contacts?: No Does the patient have difficulty concentrating, remembering, or making decisions?: No Patient able to express need for assistance  with ADLs?: Yes Does the patient have difficulty dressing or bathing?: No Independently performs ADLs?: Yes (appropriate for developmental age) Does the patient have difficulty walking or climbing stairs?: No Weakness of Legs: None Weakness of Arms/Hands: None  Permission Sought/Granted                  Emotional Assessment Appearance:: Appears stated age Attitude/Demeanor/Rapport: Engaged Affect (typically observed): Accepting Orientation: : Oriented to Place, Oriented to  Time, Oriented to Situation, Oriented to Self   Psych Involvement: No (comment)  Admission diagnosis:  Pneumonia of both lower lobes due to infectious organism (HCC) [J18.1] Suspected Covid-19 Virus Infection [R68.89] Patient Active Problem List   Diagnosis Date Noted  . Type 2 diabetes mellitus with hyperglycemia, without long-term current use of insulin (HCC) 03/04/2019  . Abdominal pain 03/03/2019  . HTN (hypertension) 03/03/2019  . Hyperglycemia 03/03/2019  . Proteinuria 03/03/2019  . HCAP (healthcare-associated pneumonia) 09/07/2015  . Sepsis ([facility]) 09/07/2015  . Asthma 09/07/2015  . Pain in the chest   . Dyspnea 09/01/2015  . Pleuritic chest pain 09/01/2015  . Morbid obesity (HCC) 09/01/2015  . Asthma exacerbation 09/01/2015  . OSA (obstructive sleep apnea) 09/01/2015  . Influenza A 09/01/2015  . Extreme obesity 09/06/2014  . Diabetes mellitus type 2, uncontrolled (H[facility]) 09/06/2014  . Biliary colic 09/04/2014   PCP:  Fleet ContrasAvbuere, Edwin, MD Pharmacy:   [facility] [phone] [address] [address]- [place], [place] - [phone] [facility] [address] [place] [id number] Phone: [phone] Fax: [phone]  [facility]  [place], [place] [address] [place] [address] Phone: [phone] Fax: [phone]     Social Determinants of Health (SDOH) Interventions    Readmission Risk Interventions No flowsheet data  found.

## 2019-03-04 NOTE — Discharge Instructions (Signed)
Community-Acquired Pneumonia, Adult Pneumonia is an infection of the lungs. It causes swelling in the airways of the lungs. Mucus and fluid may also build up inside the airways. One type of pneumonia can happen while a person is in a hospital. A different type can happen when a person is not in a hospital (community-acquired pneumonia).  What are the causes?  This condition is caused by germs (viruses, bacteria, or fungi). Some types of germs can be passed from one person to another. This can happen when you breathe in droplets from the cough or sneeze of an infected person. What increases the risk? You are more likely to develop this condition if you:  Have a long-term (chronic) disease, such as: ? Chronic obstructive pulmonary disease (COPD). ? Asthma. ? Cystic fibrosis. ? Congestive heart failure. ? Diabetes. ? Kidney disease.  Have HIV.  Have sickle cell disease.  Have had your spleen removed.  Do not take good care of your teeth and mouth (poor dental hygiene).  Have a medical condition that increases the risk of breathing in droplets from your own mouth and nose.  Have a weakened body defense system (immune system).  Are a smoker.  Travel to areas where the germs that cause this illness are common.  Are around certain animals or the places they live. What are the signs or symptoms?  A dry cough.  A wet (productive) cough.  Fever.  Sweating.  Chest pain. This often happens when breathing deeply or coughing.  Fast breathing or trouble breathing.  Shortness of breath.  Shaking chills.  Feeling tired (fatigue).  Muscle aches. How is this treated? Treatment for this condition depends on many things. Most adults can be treated at home. In some cases, treatment must happen in a hospital. Treatment may include:  Medicines given by mouth or through an IV tube.  Being given extra oxygen.  Respiratory therapy. In rare cases, treatment for very bad  pneumonia may include:  Using a machine to help you breathe.  Having a procedure to remove fluid from around your lungs. Follow these instructions at home: Medicines  Take over-the-counter and prescription medicines only as told by your doctor. ? Only take cough medicine if you are losing sleep.  If you were prescribed an antibiotic medicine, take it as told by your doctor. Do not stop taking the antibiotic even if you start to feel better. General instructions   Sleep with your head and neck raised (elevated). You can do this by sleeping in a recliner or by putting a few pillows under your head.  Rest as needed. Get at least 8 hours of sleep each night.  Drink enough water to keep your pee (urine) pale yellow.  Eat a healthy diet that includes plenty of vegetables, fruits, whole grains, low-fat dairy products, and lean protein.  Do not use any products that contain nicotine or tobacco. These include cigarettes, e-cigarettes, and chewing tobacco. If you need help quitting, ask your doctor.  Keep all follow-up visits as told by your doctor. This is important. How is this prevented? A shot (vaccine) can help prevent pneumonia. Shots are often suggested for:  People older than 26 years of age.  People older than 26 years of age who: ? Are having cancer treatment. ? Have long-term (chronic) lung disease. ? Have problems with their body's defense system. You may also prevent pneumonia if you take these actions:  Get the flu (influenza) shot every year.  Go to the  dentist as often as told.  Wash your hands often. If you cannot use soap and water, use hand sanitizer. Contact a doctor if:  You have a fever.  You lose sleep because your cough medicine does not help. Get help right away if:  You are short of breath and it gets worse.  You have more chest pain.  Your sickness gets worse. This is very serious if: ? You are an older adult. ? Your body's defense system is  weak.  You cough up blood. Summary  Pneumonia is an infection of the lungs.  Most adults can be treated at home. Some will need treatment in a hospital.  Drink enough water to keep your pee pale yellow.  Get at least 8 hours of sleep each night. This information is not intended to replace advice given to you by your health care provider. Make sure you discuss any questions you have with your health care provider. Document Released: 11/18/2007 Document Revised: 09/21/2018 Document Reviewed: 01/27/2018 Elsevier Patient Education  2020 [employer] Diabetes Mellitus and Nutrition, Adult When you have diabetes (diabetes mellitus), it is very important to have healthy eating habits because your blood sugar (glucose) levels are greatly affected by what you eat and drink. Eating healthy foods in the appropriate amounts, at about the same times every day, can help you:  Control your blood glucose.  Lower your risk of heart disease.  Improve your blood pressure.  Reach or maintain a healthy weight. Every person with diabetes is different, and each person has different needs for a meal plan. Your health care provider may recommend that you work with a diet and nutrition specialist (dietitian) to make a meal plan that is best for you. Your meal plan may vary depending on factors such as:  The calories you need.  The medicines you take.  Your weight.  Your blood glucose, blood pressure, and cholesterol levels.  Your activity level.  Other health conditions you have, such as heart or kidney disease. How do carbohydrates affect me? Carbohydrates, also called carbs, affect your blood glucose level more than any other type of food. Eating carbs naturally raises the amount of glucose in your blood. Carb counting is a method for keeping track of how many carbs you eat. Counting carbs is important to keep your blood glucose at a healthy level, especially if you use insulin or take certain oral  diabetes medicines. It is important to know how many carbs you can safely have in each meal. This is different for every person. Your dietitian can help you calculate how many carbs you should have at each meal and for each snack. Foods that contain carbs include:  Bread, cereal, rice, pasta, and crackers.  Potatoes and corn.  Peas, beans, and lentils.  Milk and yogurt.  Fruit and juice.  Desserts, such as cakes, cookies, ice cream, and candy. How does alcohol affect me? Alcohol can cause a sudden decrease in blood glucose (hypoglycemia), especially if you use insulin or take certain oral diabetes medicines. Hypoglycemia can be a life-threatening condition. Symptoms of hypoglycemia (sleepiness, dizziness, and confusion) are similar to symptoms of having too much alcohol. If your health care provider says that alcohol is safe for you, follow these guidelines:  Limit alcohol intake to no more than 1 drink per day for nonpregnant women and 2 drinks per day for men. One drink equals 12 oz of beer, 5 oz of wine, or 1 oz of hard liquor.  Do not  drink on an empty stomach.  Keep yourself hydrated with water, diet soda, or unsweetened iced tea.  Keep in mind that regular soda, juice, and other mixers may contain a lot of sugar and must be counted as carbs. What are tips for following this plan?  Reading food labels  Start by checking the serving size on the "Nutrition Facts" label of packaged foods and drinks. The amount of calories, carbs, fats, and other nutrients listed on the label is based on one serving of the item. Many items contain more than one serving per package.  Check the total grams (g) of carbs in one serving. You can calculate the number of servings of carbs in one serving by dividing the total carbs by 15. For example, if a food has 30 g of total carbs, it would be equal to 2 servings of carbs.  Check the number of grams (g) of saturated and trans fats in one serving.  Choose foods that have low or no amount of these fats.  Check the number of milligrams (mg) of salt (sodium) in one serving. Most people should limit total sodium intake to less than 2,300 mg per day.  Always check the nutrition information of foods labeled as "low-fat" or "nonfat". These foods may be higher in added sugar or refined carbs and should be avoided.  Talk to your dietitian to identify your daily goals for nutrients listed on the label. Shopping  Avoid buying canned, premade, or processed foods. These foods tend to be high in fat, sodium, and added sugar.  Shop around the outside edge of the grocery store. This includes fresh fruits and vegetables, bulk grains, fresh meats, and fresh dairy. Cooking  Use low-heat cooking methods, such as baking, instead of high-heat cooking methods like deep frying.  Cook using healthy oils, such as olive, canola, or sunflower oil.  Avoid cooking with butter, cream, or high-fat meats. Meal planning  Eat meals and snacks regularly, preferably at the same times every day. Avoid going long periods of time without eating.  Eat foods high in fiber, such as fresh fruits, vegetables, beans, and whole grains. Talk to your dietitian about how many servings of carbs you can eat at each meal.  Eat 4-6 ounces (oz) of lean protein each day, such as lean meat, chicken, fish, eggs, or tofu. One oz of lean protein is equal to: ? 1 oz of meat, chicken, or fish. ? 1 egg. ?  cup of tofu.  Eat some foods each day that contain healthy fats, such as avocado, nuts, seeds, and fish. Lifestyle  Check your blood glucose regularly.  Exercise regularly as told by your health care provider. This may include: ? 150 minutes of moderate-intensity or vigorous-intensity exercise each week. This could be brisk walking, biking, or water aerobics. ? Stretching and doing strength exercises, such as yoga or weightlifting, at least 2 times a week.  Take medicines as told  by your health care provider.  Do not use any products that contain nicotine or tobacco, such as cigarettes and e-cigarettes. If you need help quitting, ask your health care provider.  Work with a Social worker or diabetes educator to identify strategies to manage stress and any emotional and social challenges. Questions to ask a health care provider  Do I need to meet with a diabetes educator?  Do I need to meet with a dietitian?  What number can I call if I have questions?  When are the best times to check my  blood glucose? Where to find more information:  American Diabetes Association: diabetes.org  Academy of Nutrition and Dietetics: www.eatright.[employer] of Diabetes and Digestive and Kidney Diseases (NIH): CarFli[employer].tn Summary  A healthy meal plan will help you control your blood glucose and maintain a healthy lifestyle.  Working with a diet and nutrition specialist (dietitian) can help you make a meal plan that is best for you.  Keep in mind that carbohydrates (carbs) and alcohol have immediate effects on your blood glucose levels. It is important to count carbs and to use alcohol carefully. This information is not intended to replace advice given to you by your health care provider. Make sure you discuss any questions you have with your health care provider. Document Released: 02/26/2005 Document Revised: 05/14/2017 Document Reviewed: 07/06/2016 Elsevier Patient Education  2020 [employer].

## 2019-03-04 NOTE — Progress Notes (Signed)
See previous note from [place] April, sn. Clinical instructor agrees w/ note.

## 2019-03-04 NOTE — Progress Notes (Signed)
Pt waiting on Grandma to arrive for discharge. Pt has no complaints. Midline removed. Discharge instructions provided to pt.

## 2019-03-04 NOTE — Discharge Summary (Signed)
Physician Discharge Summary  Yvette Gibbs Yvette Gibbs:[id number]:[id number] DOB: 1991/03/31 DOA: 03/03/2019  PCP: Fleet ContrasAvbuere, Edwin, MD  Admit date: 03/03/2019 Discharge date: 03/04/2019  Admitted From: Home Disposition: Home  Recommendations for Outpatient Follow-up:  Patient will be provided resource to arrange establishing care at the [facility]. Patient is being discharged on 5 days of oral Levaquin.  Home Health: None Equipment/Devices: None  Discharge Condition: Fair CODE STATUS: Full code Diet recommendation: Carb modified    Discharge Diagnoses:  Principal problem Lobar pneumonia (HCC)  Active Problems:   Extreme obesity   Diabetes mellitus type 2, uncontrolled (HCC)   Asthma   Abdominal pain   HTN (hypertension)   Hyperglycemia   Proteinuria   Type 2 diabetes mellitus with hyperglycemia, without long-term current use of insulin (HCC)  Brief narrative/HPI Please refer to admission H&P for details, in brief, 28 year old morbidly obese female with history of diabetes mellitus type 2, asthma and depression,?  OSA presented with 2 weeks of off-and-on left lower quadrant pain.  Reports episodes of nausea and diarrhea with the symptoms.  No sick contact, fevers or chills.  Reports having cough and some wheezing.  In the ED vitals were stable except for elevated blood pressure.  Blood work was unremarkable.  CT renal study was normal.  Chest x-ray showed bibasilar pneumonia.  COVID-19 was negative. Placed on observation for lobar pneumonia and bronchospasm.  Hospital course Lobar pneumonia [facility](HCC) Placed on empiric Rocephin and azithromycin.  Maintaining sats on room air.  Symptoms better this morning.  Will discharge her on oral Levaquin for 5 days along with antitussives.  Asthma with mild exacerbation Symptoms improved with 2 rounds of nebulizer.  Has not required steroid.  Will prescribe her with albuterol inhaler.  Morbid obesity (BMI 73.16  kg/m) Counseled on diet and exercise to lose weight.  Abdominal pain Possibly associated with pneumonia.  Resolved.  Type 2 diabetes mellitus, uncontrolled with hyperglycemia A1c of 7.8.  Patient has been diagnosed of diabetes at least 4 years back.  She tells me that she did know she has diabetes.  However on reviewing chart she was hospitalized 3 years back with H CAP during when her A1c was controlled at 6.1.  She was on metformin and instructed to continue it. I will prescribe her back on metformin five 1 mg twice daily.  She needs to establish care with a PCP.  Will arrange to establish care at the community wellness center.  Essential hypertension Started on low-dose lisinopril.  Has proteinuria.  Patient stable to be discharged home with outpatient care.  Disposition: Home Family communication: None   Discharge Instructions   Allergies as of 03/04/2019      Reactions   Fish Allergy Itching, Rash      Medication List    STOP taking these medications   acetaminophen 500 MG tablet Commonly known as: TYLENOL   ibuprofen 600 MG tablet Commonly known as: ADVIL   metoCLOPramide 10 MG tablet Commonly known as: REGLAN   ondansetron 4 MG disintegrating tablet Commonly known as: Zofran ODT   predniSONE 10 MG tablet Commonly known as: DELTASONE     TAKE these medications   albuterol 108 (90 Base) MCG/ACT inhaler Commonly known as: VENTOLIN HFA Inhale 1-2 puffs into the lungs every 6 (six) hours as needed for wheezing or shortness of breath.   guaiFENesin-dextromethorphan 100-10 MG/5ML syrup Commonly known as: ROBITUSSIN DM Take 5 mLs by mouth every 4 (four) hours as needed for cough.  levofloxacin 750 MG tablet Commonly known as: Levaquin Take 1 tablet (750 mg total) by mouth daily for 5 days.   lisinopril 10 MG tablet Commonly known as: ZESTRIL Take 1 tablet (10 mg total) by mouth daily.   metFORMIN 500 MG tablet Commonly known as: Glucophage Take 1  tablet (500 mg total) by mouth 2 (two) times daily with a meal.        Follow-up Information    [facility] Follow up.   Why: WILL ARRANGE TO ESTABLISH CARE Contact information: [address] [place] [id number] [phone]         Allergies  Allergen Reactions  . Fish Allergy Itching and Rash     Procedures/Studies: Dg Chest Portable 1 View  Result Date: 03/03/2019 CLINICAL DATA:  Shortness of breath and chest pain EXAM: PORTABLE CHEST 1 VIEW COMPARISON:  07/31/2017 FINDINGS: Cardiac shadow is mildly prominent accentuated by the portable technique. Lungs are well aerated. Some patchy opacities are noted in the bases bilaterally. No bony abnormality is seen. IMPRESSION: Patchy opacities in the bases bilaterally. Electronically Signed   By: Alcide CleverMark  Lukens M.D.   On: 03/03/2019 03:45   Ct Renal Stone Study  Result Date: 03/03/2019 CLINICAL DATA:  Acute generalized abdominal pain. EXAM: CT ABDOMEN AND PELVIS WITHOUT CONTRAST TECHNIQUE: Multidetector CT imaging of the abdomen and pelvis was performed following the standard protocol without IV contrast. COMPARISON:  None. FINDINGS: Lower chest: No acute abnormality. Hepatobiliary: No focal liver abnormality is seen. Status post cholecystectomy. No biliary dilatation. Pancreas: Unremarkable. No pancreatic ductal dilatation or surrounding inflammatory changes. Spleen: Normal in size without focal abnormality. Adrenals/Urinary Tract: Adrenal glands are unremarkable. Kidneys are normal, without renal calculi, focal lesion, or hydronephrosis. Bladder is unremarkable. Stomach/Bowel: The stomach appears normal. There is no evidence of bowel obstruction or inflammation. Status post appendectomy. Vascular/Lymphatic: No significant vascular findings are present. No enlarged abdominal or pelvic lymph nodes. Reproductive: Uterus and bilateral adnexa are unremarkable. Other: No abdominal wall hernia or  abnormality. No abdominopelvic ascites. Musculoskeletal: Bilateral L5 spondylolysis is noted. No other significant osseous abnormality seen. IMPRESSION: Bilateral L5 spondylolysis. No acute abnormality seen in the abdomen or pelvis. Electronically Signed   By: Lupita RaiderJames  Green Jr M.D.   On: 03/03/2019 07:57       Subjective: Breathing much improved.  Denies further abdominal pain.  Wants to go home  Discharge Exam: Vitals:   03/04/19 0604 03/04/19 0819  BP: (!) 149/73   Pulse: 92   Resp: (!) 22   Temp: 98 F (36.7 C)   SpO2: 100% 98%   Vitals:   03/03/19 1922 03/03/19 2041 03/04/19 0604 03/04/19 0819  BP:  138/72 (!) 149/73   Pulse:  99 92   Resp:  19 (!) 22   Temp:  98.4 F (36.9 C) 98 F (36.7 C)   TempSrc:  Oral Oral   SpO2: 100% 100% 100% 98%  Weight:      Height:        General: Morbidly obese female not in distress HEENT: Moist mucosa, supple neck Chest: Clear bilaterally CVS: Normal S1-S2 GI: Soft, nondistended, nontender Musculoskeletal: Warm, no edema    The results of significant diagnostics from this hospitalization (including imaging, microbiology, ancillary and laboratory) are listed below for reference.     Microbiology: Recent Results (from the past 240 hour(s))  SARS Coronavirus 2 [facility](Hospital order, [facility]) Nasopharyngeal Nasopharyngeal Swab     Status:  None   Collection Time: 03/03/19  3:30 AM   Specimen: Nasopharyngeal Swab  Result Value Ref Range Status   SARS Coronavirus 2 NEGATIVE NEGATIVE Final    Comment: (NOTE) If result is NEGATIVE SARS-CoV-2 target nucleic acids are NOT DETECTED. The SARS-CoV-2 RNA is generally detectable in upper and lower  respiratory specimens during the acute phase of infection. The lowest  concentration of SARS-CoV-2 viral copies this assay can detect is 250  copies / mL. A negative result does not preclude SARS-CoV-2 infection  and should not be used as the sole basis for treatment or  other  patient management decisions.  A negative result may occur with  improper specimen collection / handling, submission of specimen other  than nasopharyngeal swab, presence of viral mutation(s) within the  areas targeted by this assay, and inadequate number of viral copies  (<250 copies / mL). A negative result must be combined with clinical  observations, patient history, and epidemiological information. If result is POSITIVE SARS-CoV-2 target nucleic acids are DETECTED. The SARS-CoV-2 RNA is generally detectable in upper and lower  respiratory specimens dur ing the acute phase of infection.  Positive  results are indicative of active infection with SARS-CoV-2.  Clinical  correlation with patient history and other diagnostic information is  necessary to determine patient infection status.  Positive results do  not rule out bacterial infection or co-infection with other viruses. If result is PRESUMPTIVE POSTIVE SARS-CoV-2 nucleic acids MAY BE PRESENT.   A presumptive positive result was obtained on the submitted specimen  and confirmed on repeat testing.  While 2019 novel coronavirus  (SARS-CoV-2) nucleic acids may be present in the submitted sample  additional confirmatory testing may be necessary for epidemiological  and / or clinical management purposes  to differentiate between  SARS-CoV-2 and other Sarbecovirus currently known to infect humans.  If clinically indicated additional testing with an alternate test  methodology [phone]) is advised. The SARS-CoV-2 RNA is generally  detectable in upper and lower respiratory sp ecimens during the acute  phase of infection. The expected result is Negative. Fact Sheet for Patients:  BoilerBrush.com.cy Fact Sheet for Healthcare Providers: https://pope.com/ This test is not yet approved or cleared by the [place] FDA and has been authorized for detection and/or diagnosis of  SARS-CoV-2 by FDA under an Emergency Use Authorization (EUA).  This EUA will remain in effect (meaning this test can be used) for the duration of the COVID-19 declaration under Section 564(b)(1) of the Act, 21 U.S.C. section 360bbb-3(b)(1), unless the authorization is terminated or revoked sooner. Performed at [facility], [address] [place], [place] [address]      Labs: BNP (last 3 results) No results for input(s): BNP in the last 8760 hours. Basic Metabolic Panel: Recent Labs  Lab 03/03/19 0634 03/03/19 1414 03/04/19 0459  NA 139  --  138  K 3.9  --  4.9  CL 98  --  99  CO2  --   --  28  GLUCOSE 218*  --  159*  BUN 9  --  9  CREATININE 0.60 0.69 0.66  CALCIUM  --   --  9.0   Liver Function Tests: Recent Labs  Lab 03/03/19 0620 03/04/19 0459  AST 37 43*  ALT 37 38  ALKPHOS 69 60  BILITOT 0.2* 0.6  PROT 7.6 7.6  ALBUMIN 3.2* 3.1*   Recent Labs  Lab 03/03/19 0620  LIPASE 29   No results for input(s): AMMONIA in  the last 168 hours. CBC: Recent Labs  Lab 03/03/19 0330 03/03/19 0634 03/03/19 1414  WBC 6.8  --  6.6  NEUTROABS 3.9  --   --   HGB 11.6* 12.9 11.5*  HCT 39.2 38.0 39.8  MCV 94.2  --  95.4  PLT 282  --  270   Cardiac Enzymes: No results for input(s): CKTOTAL, CKMB, CKMBINDEX, TROPONINI in the last 168 hours. BNP: Invalid input(s): POCBNP CBG: No results for input(s): GLUCAP in the last 168 hours. D-Dimer No results for input(s): DDIMER in the last 72 hours. Hgb A1c Recent Labs    03/03/19 1414  HGBA1C 7.8*   Lipid Profile No results for input(s): CHOL, HDL, LDLCALC, TRIG, CHOLHDL, LDLDIRECT in the last 72 hours. Thyroid function studies No results for input(s): TSH, T4TOTAL, T3FREE, THYROIDAB in the last 72 hours.  Invalid input(s): FREET3 Anemia work up No results for input(s): VITAMINB12, FOLATE, FERRITIN, TIBC, IRON, RETICCTPCT in the last 72 hours. Urinalysis    Component Value Date/Time    COLORURINE YELLOW 03/03/2019 0620   APPEARANCEUR CLOUDY (A) 03/03/2019 0620   LABSPEC 1.031 (H) 03/03/2019 0620   PHURINE 5.0 03/03/2019 0620   GLUCOSEU 150 (A) 03/03/2019 0620   HGBUR NEGATIVE 03/03/2019 0620   BILIRUBINUR NEGATIVE 03/03/2019 0620   KETONESUR NEGATIVE 03/03/2019 0620   PROTEINUR >=300 (A) 03/03/2019 0620   UROBILINOGEN 0.2 09/04/2014 1001   NITRITE NEGATIVE 03/03/2019 0620   LEUKOCYTESUR TRACE (A) 03/03/2019 0620   Sepsis Labs Invalid input(s): PROCALCITONIN,  WBC,  LACTICIDVEN Microbiology Recent Results (from the past 240 hour(s))  SARS Coronavirus 2 [facility] order, Performed in [facility] hospital lab) Nasopharyngeal Nasopharyngeal Swab     Status: None   Collection Time: 03/03/19  3:30 AM   Specimen: Nasopharyngeal Swab  Result Value Ref Range Status   SARS Coronavirus 2 NEGATIVE NEGATIVE Final    Comment: (NOTE) If result is NEGATIVE SARS-CoV-2 target nucleic acids are NOT DETECTED. The SARS-CoV-2 RNA is generally detectable in upper and lower  respiratory specimens during the acute phase of infection. The lowest  concentration of SARS-CoV-2 viral copies this assay can detect is 250  copies / mL. A negative result does not preclude SARS-CoV-2 infection  and should not be used as the sole basis for treatment or other  patient management decisions.  A negative result may occur with  improper specimen collection / handling, submission of specimen other  than nasopharyngeal swab, presence of viral mutation(s) within the  areas targeted by this assay, and inadequate number of viral copies  (<250 copies / mL). A negative result must be combined with clinical  observations, patient history, and epidemiological information. If result is POSITIVE SARS-CoV-2 target nucleic acids are DETECTED. The SARS-CoV-2 RNA is generally detectable in upper and lower  respiratory specimens dur ing the acute phase of infection.  Positive  results are indicative of active  infection with SARS-CoV-2.  Clinical  correlation with patient history and other diagnostic information is  necessary to determine patient infection status.  Positive results do  not rule out bacterial infection or co-infection with other viruses. If result is PRESUMPTIVE POSTIVE SARS-CoV-2 nucleic acids MAY BE PRESENT.   A presumptive positive result was obtained on the submitted specimen  and confirmed on repeat testing.  While 2019 novel coronavirus  (SARS-CoV-2) nucleic acids may be present in the submitted sample  additional confirmatory testing may be necessary for epidemiological  and / or clinical management purposes  to differentiate between  SARS-CoV-2  and other Sarbecovirus currently known to infect humans.  If clinically indicated additional testing with an alternate test  methodology [phone]) is advised. The SARS-CoV-2 RNA is generally  detectable in upper and lower respiratory sp ecimens during the acute  phase of infection. The expected result is Negative. Fact Sheet for Patients:  StrictlyIdeas.no Fact Sheet for Healthcare Providers: BankingDealers.co.za This test is not yet approved or cleared by the [place] FDA and has been authorized for detection and/or diagnosis of SARS-CoV-2 by FDA under an Emergency Use Authorization (EUA).  This EUA will remain in effect (meaning this test can be used) for the duration of the COVID-19 declaration under Section 564(b)(1) of the Act, 21 U.S.C. section 360bbb-3(b)(1), unless the authorization is terminated or revoked sooner. Performed at [facility], [place] [address] [place], [place] [address]      Time coordinating discharge: Less than 30 minutes  SIGNED:   Louellen Molder, MD  [facility] 03/04/2019, 10:23 AM Pager   If 7PM-7AM, please contact night-coverage [email or web address] Password [email or web address]

## 2019-03-07 ENCOUNTER — Telehealth: Payer: Self-pay

## 2019-03-07 NOTE — Telephone Encounter (Signed)
Message received from Nancy Marus, RN CM requesting a follow up appointment for patient at [facility]  Call placed to patient and scheduled her for appointment at [facility] - 03/21/2019 @ 1430.  She said that she would need to check with her grandmother about transportation as she is not able to walk to the bus stop. Provided her with the phone number for DSS - non medicaid transportation and encouraged her to call and register.  Provided her with the address for [facility] and the contact number for [facility].

## 2019-03-21 ENCOUNTER — Encounter: Payer: Self-pay | Admitting: Internal Medicine

## 2019-03-21 ENCOUNTER — Ambulatory Visit (INDEPENDENT_AMBULATORY_CARE_PROVIDER_SITE_OTHER): Payer: Self-pay | Admitting: Internal Medicine

## 2019-03-21 DIAGNOSIS — E119 Type 2 diabetes mellitus without complications: Secondary | ICD-10-CM

## 2019-03-21 DIAGNOSIS — R109 Unspecified abdominal pain: Secondary | ICD-10-CM

## 2019-03-21 DIAGNOSIS — I10 Essential (primary) hypertension: Secondary | ICD-10-CM

## 2019-03-21 DIAGNOSIS — J4520 Mild intermittent asthma, uncomplicated: Secondary | ICD-10-CM

## 2019-03-21 DIAGNOSIS — J189 Pneumonia, unspecified organism: Secondary | ICD-10-CM

## 2019-03-21 MED ORDER — METFORMIN HCL 500 MG PO TABS: 500.0000 mg | ORAL_TABLET | Freq: Two times a day (BID) | ORAL | 3 refills | Status: DC

## 2019-03-21 MED ORDER — LISINOPRIL 10 MG PO TABS: 10.0000 mg | ORAL_TABLET | Freq: Every day | ORAL | 3 refills | Status: DC

## 2019-03-21 MED ORDER — ALBUTEROL SULFATE HFA 108 (90 BASE) MCG/ACT IN AERS: 1.0000 | INHALATION_SPRAY | Freq: Four times a day (QID) | RESPIRATORY_TRACT | 6 refills | Status: DC | PRN

## 2019-03-21 NOTE — Progress Notes (Signed)
Patient following up from recent hospital visit. States that she is still having sharp abdominal pain, nausea & diarrhea. Pain is worse with movements.  Doesn't have an appendix or gallbladder so she isn't sure the cause of pain.  Still having SHOB & wheezing with exertion. Slight cough.  Also thinks she's having an allergic reaction to Lisinopril. Having excessive sweating & a crawling sensation on her skin since starting it.

## 2019-03-21 NOTE — Progress Notes (Signed)
Virtual Visit via Telephone Note [place] [facility] Pt was offered in person visit but pt had declined due to transportation issue.  There was some difficulty hearing the patient during this telephone visit as she tends to [facility] and there was some background echoing.  I connected with Yvette Gibbs on 03/21/19 at  2:30 PM EDT by telephone and verified that I am speaking with the correct person using two identifiers.  I am in my office.  The patient is at home.  Only the patient and myself participated in this encounter.  I discussed the limitations, risks, security and privacy concerns of performing an evaluation and management service by telephone and the availability of in person appointments. I also discussed with the patient that there may be a patient responsible charge related to this service. The patient expressed understanding and agreed to proceed.   History of Present Illness: Patient with history of DM with protein urea, asthma, HTN, obesity, questionable OSA.  Purpose of today's visit was hospital follow-up.  Previous PCP was Dr.Avbuere.  She decided to switch because she was not pleased with the care that she was receiving.  Patient hospitalized 9/18-19/2020 with bilateral lobar pneumonia.  Patient was discharged after 1 day on Levaquin for 5 days.  She was also found to have diabetes type 2 with A1c of 7.8.  She was discharged home on metformin.  Diagnosed with HTN and was started on low-dose lisinopril.  Today patient denies any shortness of breath or fever.  She reports some wheezing at times when she uses her albuterol as needed.  Since discharge from the hospital she states that she has used albuterol inhaler about 4 times.  She never did pick up the prescription for the antibiotics because she states she did not go to the hospital for her breathing she went for abdominal pain which she states she is still having and they never told her what was causing it.  She did have CT renal  study that was normal.  Denies any nausea vomiting at this time.  In regards to the diabetes.  Patient states that she never was diagnosed with diabetes that I see that she did have prediabetes in the past and definitely diagnosed based on A1c of 7.8 during hospitalization.  She reports compliance with metformin.  She feels she can do better with her eating habits.  Not getting in much exercise.  HTN: She reports compliance with lisinopril but she feels that it makes her sweat a lot.  She does have a device to check blood pressure but is not checking it.   Current Outpatient Medications on File Prior to Visit  Medication Sig Dispense Refill  . guaiFENesin-dextromethorphan (ROBITUSSIN DM) 100-10 MG/5ML syrup Take 5 mLs by mouth every 4 (four) hours as needed for cough. 118 mL 0   No current facility-administered medications on file prior to visit.    Social History   Social History Narrative  . Not on file    Observations/Objective:    Chemistry      Component Value Date/Time   NA 138 03/04/2019 0459   K 4.9 03/04/2019 0459   CL 99 03/04/2019 0459   CO2 28 03/04/2019 0459   BUN 9 03/04/2019 0459   CREATININE 0.66 03/04/2019 0459      Component Value Date/Time   CALCIUM 9.0 03/04/2019 0459   ALKPHOS 60 03/04/2019 0459   AST 43 (H) 03/04/2019 0459   ALT 38 03/04/2019 0459   BILITOT 0.6 03/04/2019 0459  Lab Results  Component Value Date   WBC 7.0 03/04/2019   HGB 11.1 (L) 03/04/2019   HCT 37.8 03/04/2019   MCV 95.5 03/04/2019   PLT 258 03/04/2019    Assessment and Plan: 1. Community acquired pneumonia, unspecified laterality Patient she should pick up and complete the antibiotics.  2. Mild intermittent asthma without complication - albuterol (VENTOLIN HFA) 108 (90 Base) MCG/ACT inhaler; Inhale 1-2 puffs into the lungs every 6 (six) hours as needed for wheezing or shortness of breath.  Dispense: 18 g; Refill: 6  3. Type 2 diabetes mellitus without complication,  without long-term current use of insulin (HCC) Continue metformin.  Dietary counseling given.  Encouraged her to eliminate sugary drinks from her diet, cut back on white carbohydrates.  Incorporate fresh fruits and vegetables into the diet and eat more white meat than red meat. Encouraged her to be more active.  Advised of the goal of getting in at least 150 minutes/week of moderate intensity exercise - metFORMIN (GLUCOPHAGE) 500 MG tablet; Take 1 tablet (500 mg total) by mouth 2 (two) times daily with a meal.  Dispense: 60 tablet; Refill: 3  4. Essential hypertension Encouraged her to check blood pressure at least twice a week with goal being 130/80 or lower - lisinopril (ZESTRIL) 10 MG tablet; Take 1 tablet (10 mg total) by mouth daily.  Dispense: 30 tablet; Refill: 3  5. Abdominal pain, unspecified abdominal location Advised that this will have to be evaluated as an in person visit   Follow Up Instructions: Within 1 month   I discussed the assessment and treatment plan with the patient. The patient was provided an opportunity to ask questions and all were answered. The patient agreed with the plan and demonstrated an understanding of the instructions.   The patient was advised to call back or seek an in-person evaluation if the symptoms worsen or if the condition fails to improve as anticipated.  I provided 12 minutes of non-face-to-face time during this encounter.   Jonah Blue, MD

## 2019-04-21 ENCOUNTER — Ambulatory Visit: Payer: Self-pay | Admitting: Internal Medicine

## 2019-05-22 ENCOUNTER — Ambulatory Visit: Payer: Medicaid Other | Admitting: Internal Medicine

## 2019-06-19 ENCOUNTER — Encounter: Payer: Self-pay | Admitting: Internal Medicine

## 2019-06-19 ENCOUNTER — Ambulatory Visit: Payer: Medicaid Other | Attending: Internal Medicine | Admitting: Internal Medicine

## 2019-06-19 ENCOUNTER — Other Ambulatory Visit: Payer: Self-pay

## 2019-06-19 DIAGNOSIS — K219 Gastro-esophageal reflux disease without esophagitis: Secondary | ICD-10-CM | POA: Insufficient documentation

## 2019-06-19 DIAGNOSIS — G47419 Narcolepsy without cataplexy: Secondary | ICD-10-CM

## 2019-06-19 DIAGNOSIS — I10 Essential (primary) hypertension: Secondary | ICD-10-CM

## 2019-06-19 DIAGNOSIS — R0683 Snoring: Secondary | ICD-10-CM | POA: Diagnosis not present

## 2019-06-19 DIAGNOSIS — R05 Cough: Secondary | ICD-10-CM | POA: Diagnosis not present

## 2019-06-19 DIAGNOSIS — R059 Cough, unspecified: Secondary | ICD-10-CM

## 2019-06-19 DIAGNOSIS — E119 Type 2 diabetes mellitus without complications: Secondary | ICD-10-CM

## 2019-06-19 MED ORDER — OMEPRAZOLE 20 MG PO CPDR: 20.0000 mg | DELAYED_RELEASE_CAPSULE | Freq: Every day | ORAL | 3 refills | Status: DC

## 2019-06-19 MED ORDER — METFORMIN HCL 500 MG PO TABS: 500.0000 mg | ORAL_TABLET | Freq: Two times a day (BID) | ORAL | 3 refills | Status: DC

## 2019-06-19 NOTE — Progress Notes (Signed)
Virtual Visit via Telephone Note Due to current restrictions/limitations of in-office visits due to the COVID-19 pandemic, this scheduled clinical appointment was converted to a telehealth visit  I connected with Yvette Gibbs on 06/19/19 at 1:42 p.m by telephone and verified that I am speaking with the correct person using two identifiers. I am in my office.  The patient is at home.  Only the patient and myself participated in this encounter.  At one point her grandmother did give some input in the background regarding history of narcolepsy.  I discussed the limitations, risks, security and privacy concerns of performing an evaluation and management service by telephone and the availability of in person appointments. I also discussed with the patient that there may be a patient responsible charge related to this service. The patient expressed understanding and agreed to proceed.   History of Present Illness: Patient with history of DM with protein urea, asthma, HTN, obesity, questionable OSA.    C/o having a "lil minor cough" that started last night while she was watching TV. No fever or SOB, chest congestion. Had wheezing for 2 days but this has resolve.  She is taking some over-the-counter cough medication.  She has not had any sick contacts.  C/o morning headaches that occurs some days.  Reports she was suppose to be scheduled for sleep study post hosp but never got appt.  She endorses loud snoring.  Reports being dx with narcolepsy years ago. GM said she had sleep study in 2013 and was suppose to get a "sleep machine" but never got 1.  Reports she falls asleep easily.  DM: rxn sent to [facility] 03/2019.  However pt states they never got the rxn Reports decrease appetite. Eating more salads, fruits and veggies.   -walking daily 2-3 hrs a day depending on her asthma or if her back hurts Despite these efforts she has not lost any weight.  She reports that she weighs over 300 pounds.  Wanting to  know about being placed on a diet pill.  HTN: reports she had allergic reaction to Lisinopril.  Caused her to sweat a lot and also had a rash so she discontinued taking it. Checks BP daily.  gives range 140-150/90.  She urinates a lot.  C/o acid reflux with just about anything she eats.  Request medicine for acid reflux.   Current Outpatient Medications on File Prior to Visit  Medication Sig Dispense Refill  . albuterol (VENTOLIN HFA) 108 (90 Base) MCG/ACT inhaler Inhale 1-2 puffs into the lungs every 6 (six) hours as needed for wheezing or shortness of breath. 18 g 6  . guaiFENesin-dextromethorphan (ROBITUSSIN DM) 100-10 MG/5ML syrup Take 5 mLs by mouth every 4 (four) hours as needed for cough. 118 mL 0  . lisinopril (ZESTRIL) 10 MG tablet Take 1 tablet (10 mg total) by mouth daily. 30 tablet 3  . metFORMIN (GLUCOPHAGE) 500 MG tablet Take 1 tablet (500 mg total) by mouth 2 (two) times daily with a meal. 60 tablet 3   No current facility-administered medications on file prior to visit.      Observations/Objective: Results for orders placed or performed during the hospital encounter of 03/03/19  SARS Coronavirus 2 [facility] order, Performed in [facility] hospital lab) Nasopharyngeal Nasopharyngeal Swab   Specimen: Nasopharyngeal Swab  Result Value Ref Range   SARS Coronavirus 2 NEGATIVE NEGATIVE  CBC with Differential/Platelet  Result Value Ref Range   WBC 6.8 4.0 - 10.5 K/uL   RBC 4.16 3.87 - 5.11  MIL/uL   Hemoglobin 11.6 (L) 12.0 - 15.0 g/dL   HCT 14.7 82.9 - 56.2 %   MCV 94.2 80.0 - 100.0 fL   MCH 27.9 26.0 - 34.0 pg   MCHC 29.6 (L) 30.0 - 36.0 g/dL   RDW 13.0 86.5 - 78.4 %   Platelets 282 150 - 400 K/uL   nRBC 0.0 0.0 - 0.2 %   Neutrophils Relative % 58 %   Neutro Abs 3.9 1.7 - 7.7 K/uL   Lymphocytes Relative 26 %   Lymphs Abs 1.8 0.7 - 4.0 K/uL   Monocytes Relative 13 %   Monocytes Absolute 0.9 0.1 - 1.0 K/uL   Eosinophils Relative 3 %   Eosinophils Absolute 0.2 0.0 -  0.5 K/uL   Basophils Relative 0 %   Basophils Absolute 0.0 0.0 - 0.1 K/uL   Immature Granulocytes 0 %   Abs Immature Granulocytes 0.03 0.00 - 0.07 K/uL  Hepatic function panel  Result Value Ref Range   Total Protein 7.6 6.5 - 8.1 g/dL   Albumin 3.2 (L) 3.5 - 5.0 g/dL   AST 37 15 - 41 U/L   ALT 37 0 - 44 U/L   Alkaline Phosphatase 69 38 - 126 U/L   Total Bilirubin 0.2 (L) 0.3 - 1.2 mg/dL   Bilirubin, Direct <6.9 0.0 - 0.2 mg/dL   Indirect Bilirubin NOT CALCULATED 0.3 - 0.9 mg/dL  Lipase, blood  Result Value Ref Range   Lipase 29 11 - 51 U/L  Urinalysis, Routine w reflex microscopic  Result Value Ref Range   Color, Urine YELLOW YELLOW   APPearance CLOUDY (A) CLEAR   Specific Gravity, Urine 1.031 (H) 1.005 - 1.030   pH 5.0 5.0 - 8.0   Glucose, UA 150 (A) NEGATIVE mg/dL   Hgb urine dipstick NEGATIVE NEGATIVE   Bilirubin Urine NEGATIVE NEGATIVE   Ketones, ur NEGATIVE NEGATIVE mg/dL   Protein, ur >=629 (A) NEGATIVE mg/dL   Nitrite NEGATIVE NEGATIVE   Leukocytes,Ua TRACE (A) NEGATIVE   RBC / HPF 6-10 0 - 5 RBC/hpf   WBC, UA 11-20 0 - 5 WBC/hpf   Bacteria, UA MANY (A) NONE SEEN   Squamous Epithelial / LPF 11-20 0 - 5   Mucus PRESENT    Hyaline Casts, UA PRESENT   HIV antibody (Routine Testing)  Result Value Ref Range   HIV Screen 4th Generation wRfx Non Reactive Non Reactive  CBC  Result Value Ref Range   WBC 6.6 4.0 - 10.5 K/uL   RBC 4.17 3.87 - 5.11 MIL/uL   Hemoglobin 11.5 (L) 12.0 - 15.0 g/dL   HCT 52.8 41.3 - 24.4 %   MCV 95.4 80.0 - 100.0 fL   MCH 27.6 26.0 - 34.0 pg   MCHC 28.9 (L) 30.0 - 36.0 g/dL   RDW 01.0 27.2 - 53.6 %   Platelets 270 150 - 400 K/uL   nRBC 0.0 0.0 - 0.2 %  Creatinine, serum  Result Value Ref Range   Creatinine, Ser 0.69 0.44 - 1.00 mg/dL   GFR calc non Af Amer >60 >60 mL/min   GFR calc Af Amer >60 >60 mL/min  Hemoglobin A1c  Result Value Ref Range   Hgb A1c MFr Bld 7.8 (H) 4.8 - 5.6 %   Mean Plasma Glucose 177.16 mg/dL  Comprehensive  metabolic panel  Result Value Ref Range   Sodium 138 135 - 145 mmol/L   Potassium 4.9 3.5 - 5.1 mmol/L   Chloride 99 98 - 111 mmol/L  CO2 28 22 - 32 mmol/L   Glucose, Bld 159 (H) 70 - 99 mg/dL   BUN 9 6 - 20 mg/dL   Creatinine, Ser 9.37 0.44 - 1.00 mg/dL   Calcium 9.0 8.9 - 16.9 mg/dL   Total Protein 7.6 6.5 - 8.1 g/dL   Albumin 3.1 (L) 3.5 - 5.0 g/dL   AST 43 (H) 15 - 41 U/L   ALT 38 0 - 44 U/L   Alkaline Phosphatase 60 38 - 126 U/L   Total Bilirubin 0.6 0.3 - 1.2 mg/dL   GFR calc non Af Amer >60 >60 mL/min   GFR calc Af Amer >60 >60 mL/min   Anion gap 11 5 - 15  CBC  Result Value Ref Range   WBC 7.0 4.0 - 10.5 K/uL   RBC 3.96 3.87 - 5.11 MIL/uL   Hemoglobin 11.1 (L) 12.0 - 15.0 g/dL   HCT 67.8 93.8 - 10.1 %   MCV 95.5 80.0 - 100.0 fL   MCH 28.0 26.0 - 34.0 pg   MCHC 29.4 (L) 30.0 - 36.0 g/dL   RDW 75.1 02.5 - 85.2 %   Platelets 258 150 - 400 K/uL   nRBC 0.0 0.0 - 0.2 %  I-stat chem 8, ED (not at [facility] or [facility])  Result Value Ref Range   Sodium 139 135 - 145 mmol/L   Potassium 3.9 3.5 - 5.1 mmol/L   Chloride 98 98 - 111 mmol/L   BUN 9 6 - 20 mg/dL   Creatinine, Ser 7.78 0.44 - 1.00 mg/dL   Glucose, Bld 242 (H) 70 - 99 mg/dL   Calcium, Ion 3.53 6.14 - 1.40 mmol/L   TCO2 30 22 - 32 mmol/L   Hemoglobin 12.9 12.0 - 15.0 g/dL   HCT 43.1 54.0 - 08.6 %  I-Stat Beta hCG blood, ED ([facility], [facility], [facility] only)  Result Value Ref Range   I-stat hCG, quantitative <5.0 <5 mIU/mL   Comment 3             Assessment and Plan: 1. Type 2 diabetes mellitus without complication, without long-term current use of insulin (HCC) I had sent refills on Metformin on initial telephone visit in October.  I am not sure why the pharmacy is stating that they did not receive the prescription.  I will resend it. Encouraged her to continue regular exercise. - metFORMIN (GLUCOPHAGE) 500 MG tablet; Take 1 tablet (500 mg total) by mouth 2 (two) times daily with a meal.  Dispense: 60 tablet; Refill: 3  2.  Essential hypertension Reported blood pressure readings not at goal.  Since she did not tolerate lisinopril, I recommend a different blood pressure medicine like hydrochlorothiazide or amlodipine.  She did not want the hydrochlorothiazide because she states that she already urinates too much so I recommended amlodipine.  Patient tells me that she does not like taking medications and does not like the fact that I am trying to put her on all of these medicines that her body will likely reject. She tells me that she will not take medications that I prescribe. I asked her then what is the point of her seeking my advice if she will not take medication for blood pressure or diabetes  3. Cough Does not sound like acute URI at this time.  Will observe for now  4. Morbid obesity (HCC) Given that she reports walking 3 to 4 hours a day and has made changes in her eating habits with little improvement in her weight,  I recommend referral to medical weight management program.  I told her what the program entails.  However patient declined stating that she does not need anyone to tell her that she is obese and that people do not treat you right when you are obese.  She states that she has a sharp mouth and would go off very easily.    5. Gastroesophageal reflux disease without esophagitis GERD precautions discussed.  Advised of types of foods to avoid.  Also advised that she eat her last meal at least 2 to 3 hours before laying down at nights and to sleep with her head slightly elevated.  I prescribed omeprazole - omeprazole (PRILOSEC) 20 MG capsule; Take 1 capsule (20 mg total) by mouth daily.  Dispense: 30 capsule; Refill: 3  6. Loud snoring I think the morning headaches are likely due to sleep apnea.  I recommend referral for sleep study - [facility] Sleep Study; Future  7. Uncontrolled narcolepsy She needs a multiple sleep latency test to confirm her history. - [facility] Sleep Study; Future - Multiple sleep latency  test; Future  Does not want to see weight management.  I do want people telling me [facility]. Not going to take blood pressure medicine and all these medicines that you try to put me on.  I do not like taking medicine in my body rejects them.  Follow Up Instructions: Patient to follow-up when she desires to do so   I discussed the assessment and treatment plan with the patient. The patient was provided an opportunity to ask questions and all were answered. The patient agreed with the plan and demonstrated an understanding of the instructions.   The patient was advised to call back or seek an in-person evaluation if the symptoms worsen or if the condition fails to improve as anticipated.  I provided 27 minutes of non-face-to-face time during this encounter.   Jonah Blue, MD

## 2019-07-21 ENCOUNTER — Other Ambulatory Visit (HOSPITAL_COMMUNITY): Payer: Medicaid Other

## 2019-07-23 ENCOUNTER — Encounter (HOSPITAL_BASED_OUTPATIENT_CLINIC_OR_DEPARTMENT_OTHER): Payer: Medicaid Other | Admitting: Internal Medicine

## 2019-07-24 ENCOUNTER — Encounter (HOSPITAL_BASED_OUTPATIENT_CLINIC_OR_DEPARTMENT_OTHER): Payer: Medicaid Other | Admitting: Internal Medicine

## 2019-08-05 ENCOUNTER — Other Ambulatory Visit (HOSPITAL_COMMUNITY): Payer: Medicaid Other

## 2019-08-08 ENCOUNTER — Encounter (HOSPITAL_BASED_OUTPATIENT_CLINIC_OR_DEPARTMENT_OTHER): Payer: Medicaid Other | Admitting: Internal Medicine

## 2019-08-09 ENCOUNTER — Encounter (HOSPITAL_BASED_OUTPATIENT_CLINIC_OR_DEPARTMENT_OTHER): Payer: Medicaid Other | Admitting: Internal Medicine

## 2019-10-12 ENCOUNTER — Other Ambulatory Visit (HOSPITAL_COMMUNITY): Payer: Medicaid Other

## 2019-10-15 ENCOUNTER — Ambulatory Visit (HOSPITAL_BASED_OUTPATIENT_CLINIC_OR_DEPARTMENT_OTHER): Payer: Medicaid Other | Attending: Internal Medicine | Admitting: Internal Medicine

## 2019-10-16 ENCOUNTER — Encounter (HOSPITAL_BASED_OUTPATIENT_CLINIC_OR_DEPARTMENT_OTHER): Payer: Medicaid Other | Admitting: Internal Medicine

## 2019-12-14 DIAGNOSIS — Z419 Encounter for procedure for purposes other than remedying health state, unspecified: Secondary | ICD-10-CM | POA: Diagnosis not present

## 2020-01-14 DIAGNOSIS — Z419 Encounter for procedure for purposes other than remedying health state, unspecified: Secondary | ICD-10-CM | POA: Diagnosis not present

## 2020-02-14 DIAGNOSIS — Z419 Encounter for procedure for purposes other than remedying health state, unspecified: Secondary | ICD-10-CM | POA: Diagnosis not present

## 2020-02-25 ENCOUNTER — Other Ambulatory Visit: Payer: Self-pay

## 2020-02-25 ENCOUNTER — Encounter (HOSPITAL_COMMUNITY): Payer: Self-pay | Admitting: Emergency Medicine

## 2020-02-25 ENCOUNTER — Emergency Department (HOSPITAL_COMMUNITY)
Admission: EM | Admit: 2020-02-25 | Discharge: 2020-02-26 | Disposition: A | Discharge status: Home / Self Care | Payer: Medicaid Other | Attending: Emergency Medicine | Admitting: Emergency Medicine

## 2020-02-25 ENCOUNTER — Emergency Department (HOSPITAL_COMMUNITY): Payer: Medicaid Other

## 2020-02-25 DIAGNOSIS — R509 Fever, unspecified: Secondary | ICD-10-CM | POA: Insufficient documentation

## 2020-02-25 DIAGNOSIS — E1165 Type 2 diabetes mellitus with hyperglycemia: Secondary | ICD-10-CM | POA: Insufficient documentation

## 2020-02-25 DIAGNOSIS — J189 Pneumonia, unspecified organism: Secondary | ICD-10-CM | POA: Diagnosis not present

## 2020-02-25 DIAGNOSIS — Z79899 Other long term (current) drug therapy: Secondary | ICD-10-CM | POA: Insufficient documentation

## 2020-02-25 DIAGNOSIS — R079 Chest pain, unspecified: Secondary | ICD-10-CM

## 2020-02-25 DIAGNOSIS — J45909 Unspecified asthma, uncomplicated: Secondary | ICD-10-CM | POA: Diagnosis not present

## 2020-02-25 DIAGNOSIS — I10 Essential (primary) hypertension: Secondary | ICD-10-CM | POA: Diagnosis not present

## 2020-02-25 DIAGNOSIS — R05 Cough: Secondary | ICD-10-CM | POA: Insufficient documentation

## 2020-02-25 DIAGNOSIS — I517 Cardiomegaly: Secondary | ICD-10-CM | POA: Diagnosis not present

## 2020-02-25 DIAGNOSIS — Z7951 Long term (current) use of inhaled steroids: Secondary | ICD-10-CM | POA: Diagnosis not present

## 2020-02-25 DIAGNOSIS — R072 Precordial pain: Secondary | ICD-10-CM | POA: Diagnosis not present

## 2020-02-25 DIAGNOSIS — R197 Diarrhea, unspecified: Secondary | ICD-10-CM | POA: Diagnosis not present

## 2020-02-25 DIAGNOSIS — Z7984 Long term (current) use of oral hypoglycemic drugs: Secondary | ICD-10-CM | POA: Insufficient documentation

## 2020-02-25 DIAGNOSIS — R519 Headache, unspecified: Secondary | ICD-10-CM | POA: Insufficient documentation

## 2020-02-25 DIAGNOSIS — Z20822 Contact with and (suspected) exposure to covid-19: Secondary | ICD-10-CM | POA: Diagnosis not present

## 2020-02-25 DIAGNOSIS — R059 Cough, unspecified: Secondary | ICD-10-CM

## 2020-02-25 DIAGNOSIS — R0602 Shortness of breath: Secondary | ICD-10-CM | POA: Insufficient documentation

## 2020-02-25 DIAGNOSIS — J8410 Pulmonary fibrosis, unspecified: Secondary | ICD-10-CM | POA: Diagnosis not present

## 2020-02-25 LAB — CBC
HCT: 33.9 % — ABNORMAL LOW (ref 36.0–46.0)
Hemoglobin: 10.2 g/dL — ABNORMAL LOW (ref 12.0–15.0)
MCH: 28.2 pg (ref 26.0–34.0)
MCHC: 30.1 g/dL (ref 30.0–36.0)
MCV: 93.6 fL (ref 80.0–100.0)
Platelets: 277 10*3/uL (ref 150–400)
RBC: 3.62 MIL/uL — ABNORMAL LOW (ref 3.87–5.11)
RDW: 13.2 % (ref 11.5–15.5)
WBC: 6.1 10*3/uL (ref 4.0–10.5)
nRBC: 0 % (ref 0.0–0.2)

## 2020-02-25 LAB — I-STAT BETA HCG BLOOD, ED (MC, WL, AP ONLY): I-stat hCG, quantitative: <5 m[IU]/mL (ref <5)

## 2020-02-25 LAB — BASIC METABOLIC PANEL
Anion gap: 10 (ref 5–15)
BUN: 5 mg/dL — ABNORMAL LOW (ref 6–20)
CO2: 29 mmol/L (ref 22–32)
Calcium: 9 mg/dL (ref 8.9–10.3)
Chloride: 99 mmol/L (ref 98–111)
Creatinine, Ser: 0.61 mg/dL (ref 0.44–1.00)
GFR calc Af Amer: >60 mL/min (ref >60)
GFR calc non Af Amer: >60 mL/min (ref >60)
Glucose, Bld: 196 mg/dL — ABNORMAL HIGH (ref 70–99)
Potassium: 3.8 mmol/L (ref 3.5–5.1)
Sodium: 138 mmol/L (ref 135–145)

## 2020-02-25 LAB — TROPONIN I (HIGH SENSITIVITY): Troponin I (High Sensitivity): 4 ng/L (ref <18)

## 2020-02-25 NOTE — ED Triage Notes (Signed)
Pt reports left chest pain that moves to the center of her chest.  Feels like something is sitting on her chest, started Sept 1st.  Pt also reports a headache since the 1st.

## 2020-02-26 ENCOUNTER — Emergency Department (HOSPITAL_COMMUNITY): Payer: Medicaid Other

## 2020-02-26 DIAGNOSIS — I517 Cardiomegaly: Secondary | ICD-10-CM | POA: Diagnosis not present

## 2020-02-26 DIAGNOSIS — R072 Precordial pain: Secondary | ICD-10-CM | POA: Diagnosis not present

## 2020-02-26 DIAGNOSIS — J8410 Pulmonary fibrosis, unspecified: Secondary | ICD-10-CM | POA: Diagnosis not present

## 2020-02-26 DIAGNOSIS — R0602 Shortness of breath: Secondary | ICD-10-CM | POA: Diagnosis not present

## 2020-02-26 DIAGNOSIS — J189 Pneumonia, unspecified organism: Secondary | ICD-10-CM | POA: Diagnosis not present

## 2020-02-26 LAB — LIPASE, BLOOD: Lipase: 24 U/L (ref 11–51)

## 2020-02-26 LAB — HEPATIC FUNCTION PANEL
ALT: 48 U/L — ABNORMAL HIGH (ref 0–44)
AST: 35 U/L (ref 15–41)
Albumin: 3.3 g/dL — ABNORMAL LOW (ref 3.5–5.0)
Alkaline Phosphatase: 68 U/L (ref 38–126)
Bilirubin, Direct: 0.2 mg/dL (ref 0.0–0.2)
Indirect Bilirubin: 0.6 mg/dL (ref 0.3–0.9)
Total Bilirubin: 0.8 mg/dL (ref 0.3–1.2)
Total Protein: 7.4 g/dL (ref 6.5–8.1)

## 2020-02-26 LAB — URINALYSIS, ROUTINE W REFLEX MICROSCOPIC
Bacteria, UA: NONE SEEN
Bilirubin Urine: NEGATIVE
Glucose, UA: NEGATIVE mg/dL
Hgb urine dipstick: NEGATIVE
Ketones, ur: NEGATIVE mg/dL
Leukocytes,Ua: NEGATIVE
Nitrite: NEGATIVE
Protein, ur: 100 mg/dL — AB
Specific Gravity, Urine: 1.039 — ABNORMAL HIGH (ref 1.005–1.030)
pH: 5 (ref 5.0–8.0)

## 2020-02-26 LAB — TROPONIN I (HIGH SENSITIVITY): Troponin I (High Sensitivity): 6 ng/L (ref <18)

## 2020-02-26 LAB — SARS CORONAVIRUS 2 BY RT PCR (HOSPITAL ORDER, PERFORMED IN ~~LOC~~ HOSPITAL LAB): SARS Coronavirus 2: NEGATIVE

## 2020-02-26 LAB — D-DIMER, QUANTITATIVE: D-Dimer, Quant: 0.54 ug/mL-FEU — ABNORMAL HIGH (ref 0.00–0.50)

## 2020-02-26 MED ORDER — DOXYCYCLINE HYCLATE 100 MG PO CAPS: 100.0000 mg | ORAL_CAPSULE | Freq: Two times a day (BID) | ORAL | 0 refills | Status: AC

## 2020-02-26 MED ORDER — SODIUM CHLORIDE 0.9 % IV BOLUS
1000.0000 mL | Freq: Once | INTRAVENOUS | Status: AC
  Administered 2020-02-26: 1000 mL via INTRAVENOUS

## 2020-02-26 MED ORDER — IOHEXOL 350 MG/ML SOLN
75.0000 mL | Freq: Once | INTRAVENOUS | Status: AC | PRN
  Administered 2020-02-26: 75 mL via INTRAVENOUS

## 2020-02-26 MED ORDER — DIPHENHYDRAMINE HCL 50 MG/ML IJ SOLN
25.0000 mg | Freq: Once | INTRAMUSCULAR | Status: AC
  Administered 2020-02-26: 25 mg via INTRAVENOUS
  Filled 2020-02-26: qty 1

## 2020-02-26 MED ORDER — PROCHLORPERAZINE EDISYLATE 10 MG/2ML IJ SOLN
10.0000 mg | Freq: Once | INTRAMUSCULAR | Status: AC
  Administered 2020-02-26: 10 mg via INTRAVENOUS
  Filled 2020-02-26: qty 2

## 2020-02-26 NOTE — ED Notes (Signed)
Pt transported back from CT.  

## 2020-02-26 NOTE — ED Notes (Signed)
Pt transported to CT ?

## 2020-02-26 NOTE — Discharge Instructions (Signed)
Your work-up today determined that your symptoms are likely due to multifocal pneumonia that we discovered on imaging.  Your Covid test was negative and your heart enzymes were reassuring as return to them.  Please follow-up with your primary doctor and take the antibiotics.  If any symptoms change or worsen, please return to nearest emergency department.

## 2020-02-26 NOTE — ED Provider Notes (Signed)
[facility] EMERGENCY DEPARTMENT Provider Note   CSN: [id number] Arrival date & time: 02/25/20  2126     History Chief Complaint  Patient presents with  . Chest Pain    Yvette Gibbs is a 29 y.o. female.  The history is provided by the patient and medical records. No language interpreter was used.  Chest Pain Pain location:  L chest and substernal area Pain quality: crushing, pressure and sharp   Pain radiates to:  L shoulder Pain severity:  Severe Onset quality:  Gradual Duration:  4 days Timing:  Intermittent Progression:  Waxing and waning Chronicity:  New Relieved by:  Nothing Worsened by:  Exertion and deep breathing Ineffective treatments:  None tried Associated symptoms: cough, fever, headache, nausea and shortness of breath   Associated symptoms: no abdominal pain, no altered mental status, no anxiety, no back pain, no diaphoresis, no dizziness, no fatigue, no lower extremity edema, no numbness, no palpitations, no vomiting and no weakness   Risk factors: no coronary artery disease        Past Medical History:  Diagnosis Date  . Asthma   . Depression   . Diabetes mellitus type 2, uncontrolled (HCC)   . Extreme obesity   . Family history of adverse reaction to anesthesia    " my grandmother had an allergic reaction  . Mental disorder   . Obesity   . OSA (obstructive sleep apnea)     Patient Active Problem List   Diagnosis Date Noted  . Gastroesophageal reflux disease without esophagitis 06/19/2019  . Loud snoring 06/19/2019  . Type 2 diabetes mellitus without complication, without long-term current use of insulin (HCC) 03/04/2019  . Abdominal pain 03/03/2019  . HTN (hypertension) 03/03/2019  . Hyperglycemia 03/03/2019  . Proteinuria 03/03/2019  . HCAP (healthcare-associated pneumonia) 09/07/2015  . Asthma 09/07/2015  . Pleuritic chest pain 09/01/2015  . Morbid obesity (HCC) 09/01/2015  . OSA (obstructive sleep apnea)  09/01/2015  . Influenza A 09/01/2015  . Biliary colic 09/04/2014    Past Surgical History:  Procedure Laterality Date  . APPENDECTOMY    . CHOLECYSTECTOMY N/A 09/05/2014   Procedure: LAPAROSCOPIC CHOLECYSTECTOMY;  Surgeon: Axel Filler, MD;  Location: [facility] OR;  Service: General;  Laterality: N/A;  . TONSILLECTOMY       OB History   No obstetric history on file.     Family History  Problem Relation Age of Onset  . Diabetes Mellitus II Maternal Grandmother     Social History   Tobacco Use  . Smoking status: Never Smoker  . Smokeless tobacco: Never Used  Substance Use Topics  . Alcohol use: No  . Drug use: No    Home Medications Prior to Admission medications   Medication Sig Start Date End Date Taking? Authorizing Provider  albuterol (VENTOLIN HFA) 108 (90 Base) MCG/ACT inhaler Inhale 1-2 puffs into the lungs every 6 (six) hours as needed for wheezing or shortness of breath. 03/21/19   Marcine Matar, MD  guaiFENesin-dextromethorphan (ROBITUSSIN DM) 100-10 MG/5ML syrup Take 5 mLs by mouth every 4 (four) hours as needed for cough. 03/04/19   Dhungel, Theda Belfast, MD  metFORMIN (GLUCOPHAGE) 500 MG tablet Take 1 tablet (500 mg total) by mouth 2 (two) times daily with a meal. 06/19/19   Marcine Matar, MD  omeprazole (PRILOSEC) 20 MG capsule Take 1 capsule (20 mg total) by mouth daily. 06/19/19   Marcine Matar, MD    Allergies    Lisinopril and Fish allergy  Review of Systems   Review of Systems  Constitutional: Positive for fever. Negative for chills, diaphoresis and fatigue.  HENT: Negative for congestion.   Eyes: Negative for visual disturbance.  Respiratory: Positive for cough and shortness of breath. Negative for chest tightness and wheezing.   Cardiovascular: Positive for chest pain. Negative for palpitations and leg swelling.  Gastrointestinal: Positive for diarrhea and nausea. Negative for abdominal pain, constipation and vomiting.  Genitourinary: Negative  for dysuria, flank pain, frequency, pelvic pain and urgency.  Musculoskeletal: Negative for back pain, neck pain and neck stiffness.  Skin: Negative for rash and wound.  Neurological: Positive for headaches. Negative for dizziness, seizures, facial asymmetry, speech difficulty, weakness, light-headedness and numbness.  Psychiatric/Behavioral: Negative for agitation and confusion.  All other systems reviewed and are negative.   Physical Exam Updated Vital Signs BP (!) 157/104   Pulse 86   Temp 99.7 F (37.6 C) (Oral)   Resp 18   Ht  (1.575 m)   SpO2 100%   BMI 73.16 kg/m   Physical Exam Vitals and nursing note reviewed.  Constitutional:      General: She is not in acute distress.    Appearance: She is obese. She is not ill-appearing or toxic-appearing.  HENT:     Head: Normocephalic.  Eyes:     Extraocular Movements: Extraocular movements intact.     Pupils: Pupils are equal, round, and reactive to light.  Cardiovascular:     Rate and Rhythm: Normal rate and regular rhythm.     Heart sounds: Normal heart sounds. No murmur heard.   Pulmonary:     Effort: Pulmonary effort is normal.     Breath sounds: No decreased breath sounds, wheezing, rhonchi or rales.  Chest:     Chest wall: Tenderness present.  Abdominal:     General: Bowel sounds are normal.     Palpations: Abdomen is soft.  Musculoskeletal:     Cervical back: Normal range of motion.     Right lower leg: No tenderness. No edema.     Left lower leg: No tenderness. No edema.  Skin:    General: Skin is warm.     Capillary Refill: Capillary refill takes less than 2 seconds.  Neurological:     General: No focal deficit present.     Mental Status: She is alert.  Psychiatric:        Mood and Affect: Mood is anxious.     ED Results / Procedures / Treatments   Labs (all labs ordered are listed, but only abnormal results are displayed) Labs Reviewed  BASIC METABOLIC PANEL - Abnormal; Notable for the  following components:      Result Value   Glucose, Bld 196 (*)    BUN 5 (*)    All other components within normal limits  CBC - Abnormal; Notable for the following components:   RBC 3.62 (*)    Hemoglobin 10.2 (*)    HCT 33.9 (*)    All other components within normal limits  HEPATIC FUNCTION PANEL - Abnormal; Notable for the following components:   Albumin 3.3 (*)    ALT 48 (*)    All other components within normal limits  D-DIMER, QUANTITATIVE (NOT AT [facility]) - Abnormal; Notable for the following components:   D-Dimer, Quant 0.54 (*)    All other components within normal limits  URINALYSIS, ROUTINE W REFLEX MICROSCOPIC - Abnormal; Notable for the following components:   Specific Gravity, Urine 1.039 (*)  Protein, ur 100 (*)    All other components within normal limits  SARS CORONAVIRUS 2 BY RT PCR (HOSPITAL ORDER, PERFORMED IN [facility])  URINE CULTURE  LIPASE, BLOOD  I-STAT BETA HCG BLOOD, ED ([facility], [facility], [facility] ONLY)  TROPONIN I (HIGH SENSITIVITY)  TROPONIN I (HIGH SENSITIVITY)  TROPONIN I (HIGH SENSITIVITY)    EKG EKG Interpretation  Date/Time:  Sunday February 25 2020 21:47:56 EDT Ventricular Rate:  101 PR Interval:  178 QRS Duration: 120 QT Interval:  388 QTC Calculation: 503 R Axis:   -3 Text Interpretation: Sinus tachycardia Right bundle branch block Abnormal ECG When compared with ECG of 03/03/2019, No significant change was found Confirmed by Dione Booze ([id number]) on 02/26/2020 12:07:46 AM   Radiology DG Chest 2 View  Result Date: 02/25/2020 CLINICAL DATA:  Chest pain EXAM: CHEST - 2 VIEW COMPARISON:  03/03/2019 FINDINGS: There are hazy perihilar airspace opacities. There appears to be some bronchial wall thickening bilaterally. There is no pneumothorax. No large pleural effusion. The heart size is borderline enlarged. There is no definite acute osseous abnormality. IMPRESSION: Findings concerning for respiratory bronchiolitis. Electronically Signed   By:  Katherine Mantle M.D.   On: 02/25/2020 22:13   CT Angio Chest PE W and/or Wo Contrast  Result Date: 02/26/2020 CLINICAL DATA:  29 year old female with chest pain, suspected pulmonary embolism. EXAM: CT ANGIOGRAPHY CHEST WITH CONTRAST TECHNIQUE: Multidetector CT imaging of the chest was performed using the standard protocol during bolus administration of intravenous contrast. Multiplanar CT image reconstructions and MIPs were obtained to evaluate the vascular anatomy. CONTRAST:  75 mL Omnipaque 350, intravenous COMPARISON:  None. FINDINGS: Cardiovascular: Satisfactory opacification of the pulmonary arteries to the segmental level. Limited evaluation of the subsegmental pulmonary arteries secondary to quantum mottle. No evidence of pulmonary embolism. Moderate global cardiomegaly. No pericardial effusion. Mediastinum/Nodes: No enlarged mediastinal, hilar, or axillary lymph nodes. Thyroid gland, trachea, and esophagus demonstrate no significant findings. Lungs/Pleura: Respiratory motion artifact limits fine parenchymal detail. Diffuse mosaic attenuation pattern, likely secondary to expiratory phase image acquisition. There are scattered ground-glass opacities for example in the right upper (series 7, image 22) and right lower (series 7, image 53) lobes. There are few scattered punctate upper lobe calcified granulomas. No pleural effusion or pneumothorax. Upper Abdomen: The visualized upper abdomen is within normal limits. Musculoskeletal: No chest wall abnormality. No acute or significant osseous findings. Review of the MIP images confirms the above findings. IMPRESSION: 1. No evidence of pulmonary embolism to the segmental pulmonary artery level. 2. Evaluation of the pulmonary parenchyma is limited by expiratory phase image acquisition, motion artifact, and quantum mottle. There scattered ill-defined ground-glass opacities which could be related to the aforementioned artifacts versus representative of multifocal  pneumonia. Recommend correlation with clinical findings and attention on short term follow up chest CT if clinically indicated. 3. Moderate global cardiomegaly. Electronically Signed   By: Marliss Coots MD   On: 02/26/2020 11:03    Procedures Procedures (including critical care time)  Medications Ordered in ED Medications  sodium chloride 0.9 % bolus 1,000 mL (0 mLs Intravenous Stopped 02/26/20 1008)  prochlorperazine (COMPAZINE) injection 10 mg (10 mg Intravenous Given 02/26/20 0850)  diphenhydrAMINE (BENADRYL) injection 25 mg (25 mg Intravenous Given 02/26/20 0849)  iohexol (OMNIPAQUE) 350 MG/ML injection 75 mL (75 mLs Intravenous Contrast Given 02/26/20 1024)    ED Course  I have reviewed the triage vital signs and the nursing notes.  Pertinent labs & imaging results that were available during  my care of the patient were reviewed by me and considered in my medical decision making (see chart for details).    MDM Rules/Calculators/A&P                          Giavonna Blaney is a 29 y.o. female with a past medical history significant for obesity, sleep apnea, hypertension, diabetes, prior cholecystectomy, who presents with chest pain, headache, shortness of breath ongoing for the last 2 weeks as well as some cough, fever yesterday, lightheadedness, and malaise.  Patient reports that he was ago, she had a dental procedure performed and was given prophylactic antibiotics.  She does report some diarrhea related to that.  She says that since the procedure she is been having some headaches as well as feeling ill.  She says that over the last few days she has had worsened chest discomfort that feels like an elephant sitting on her chest with chest pressure.  He goes into her left shoulder.  She reports some tingling in her left arm when it is severe.  She describes up to 10 out of 10 in severity.  She reports some nausea but no vomiting.  She reports lightheadedness.  She reports associated shortness  of breath.  She reports it is exertional and pleuritic.  She reports she is been having on and off headaches since her dental procedure.  She reports minimal urinary changes.  She does report chills.  She denies neck pain or neck stiffness.  On exam, lungs are clear and chest is tender to palpation.  Abdomen is nontender.  Bowel sounds were appreciated.  No murmur.  Normal sensation and strength in extremities.  Good pulses present.  No changes in leg edema she reports.  EKG shows no STEMI.  Given her recent surgery and her new pleuritic and exertional chest pain shortness of breath, we will get labs including a D-dimer.  She will have a delta troponin checked.  She will also have laboratory testing given her nausea.  With her fever, will check for other sources of infection such as Covid and urinalysis.  We will give her headache cocktail given a headache.  Anticipate reassessment after work-up to determine disposition.  9:37 AM D-dimer is elevated.  We will get a PE study.  She reports her headache is improved after headache cocktail.       CT scan showed multifocal pneumonia but no evidence of pulmonary ballismus.  Patient was feeling much better and agreed with antibiotics and discharge home.  She will follow up with PCP and understands return precautions and follow-up instructions.  Patient with questions or concerns and was discharged in good condition after reassuring work-up aside from the pneumonia.   Final Clinical Impression(s) / ED Diagnoses Final diagnoses:  Chest pain, unspecified type  Shortness of breath  Cough  Community acquired pneumonia, unspecified laterality    Rx / DC Orders ED Discharge Orders         Ordered    doxycycline (VIBRAMYCIN) 100 MG capsule  2 times daily        02/26/20 1311         Clinical Impression: 1. Community acquired pneumonia, unspecified laterality   2. Chest pain, unspecified type   3. Shortness of breath   4. Cough      Disposition: Discharge  Condition: Good  I have discussed the results, Dx and Tx plan with the pt(& family if present). He/she/they expressed understanding and  agree(s) with the plan. Discharge instructions discussed at great length. Strict return precautions discussed and pt &/or family have verbalized understanding of the instructions. No further questions at time of discharge.    Discharge Medication List as of 02/26/2020  1:12 PM    START taking these medications   Details  doxycycline (VIBRAMYCIN) 100 MG capsule Take 1 capsule (100 mg total) by mouth 2 (two) times daily for 7 days., Starting Mon 02/26/2020, Until Mon 03/04/2020, Print        Follow Up: Marcine Matar, MD [address] [place] [address] [phone]     [facility] EMERGENCY DEPARTMENT [address] [id number] mc [place] [address] [phone]       Ann-Marie Kluge, Canary Brim, MD 02/26/20 [phone]

## 2020-02-26 NOTE — ED Notes (Signed)
Pt ambulated to restroom with steady gait.

## 2020-02-28 LAB — URINE CULTURE

## 2020-03-15 DIAGNOSIS — Z419 Encounter for procedure for purposes other than remedying health state, unspecified: Secondary | ICD-10-CM | POA: Diagnosis not present

## 2020-04-15 DIAGNOSIS — Z419 Encounter for procedure for purposes other than remedying health state, unspecified: Secondary | ICD-10-CM | POA: Diagnosis not present

## 2020-05-15 DIAGNOSIS — Z419 Encounter for procedure for purposes other than remedying health state, unspecified: Secondary | ICD-10-CM | POA: Diagnosis not present

## 2020-05-19 ENCOUNTER — Other Ambulatory Visit: Payer: Self-pay

## 2020-05-19 ENCOUNTER — Emergency Department (HOSPITAL_COMMUNITY): Payer: Medicaid Other

## 2020-05-19 ENCOUNTER — Encounter (HOSPITAL_COMMUNITY): Payer: Self-pay

## 2020-05-19 ENCOUNTER — Emergency Department (HOSPITAL_COMMUNITY)
Admission: EM | Admit: 2020-05-19 | Discharge: 2020-05-19 | Disposition: A | Discharge status: Home / Self Care | Payer: Medicaid Other | Attending: Emergency Medicine | Admitting: Emergency Medicine

## 2020-05-19 DIAGNOSIS — R059 Cough, unspecified: Secondary | ICD-10-CM | POA: Diagnosis not present

## 2020-05-19 DIAGNOSIS — R0602 Shortness of breath: Secondary | ICD-10-CM | POA: Diagnosis not present

## 2020-05-19 DIAGNOSIS — Z209 Contact with and (suspected) exposure to unspecified communicable disease: Secondary | ICD-10-CM | POA: Diagnosis not present

## 2020-05-19 DIAGNOSIS — I10 Essential (primary) hypertension: Secondary | ICD-10-CM | POA: Diagnosis not present

## 2020-05-19 DIAGNOSIS — Z79899 Other long term (current) drug therapy: Secondary | ICD-10-CM | POA: Diagnosis not present

## 2020-05-19 DIAGNOSIS — R6889 Other general symptoms and signs: Secondary | ICD-10-CM | POA: Diagnosis not present

## 2020-05-19 DIAGNOSIS — I499 Cardiac arrhythmia, unspecified: Secondary | ICD-10-CM | POA: Diagnosis not present

## 2020-05-19 DIAGNOSIS — E119 Type 2 diabetes mellitus without complications: Secondary | ICD-10-CM | POA: Insufficient documentation

## 2020-05-19 DIAGNOSIS — R079 Chest pain, unspecified: Secondary | ICD-10-CM | POA: Diagnosis not present

## 2020-05-19 DIAGNOSIS — Z7984 Long term (current) use of oral hypoglycemic drugs: Secondary | ICD-10-CM | POA: Insufficient documentation

## 2020-05-19 DIAGNOSIS — Z743 Need for continuous supervision: Secondary | ICD-10-CM | POA: Diagnosis not present

## 2020-05-19 DIAGNOSIS — R000 Tachycardia, unspecified: Secondary | ICD-10-CM | POA: Diagnosis not present

## 2020-05-19 DIAGNOSIS — U071 COVID-19: Secondary | ICD-10-CM | POA: Insufficient documentation

## 2020-05-19 HISTORY — DX: Essential (primary) hypertension: I10

## 2020-05-19 LAB — I-STAT BETA HCG BLOOD, ED (MC, WL, AP ONLY): I-stat hCG, quantitative: <5 m[IU]/mL (ref <5)

## 2020-05-19 LAB — CBC WITH DIFFERENTIAL/PLATELET
Abs Immature Granulocytes: 0.03 10*3/uL (ref 0.00–0.07)
Basophils Absolute: 0 10*3/uL (ref 0.0–0.1)
Basophils Relative: 0 %
Eosinophils Absolute: 0 10*3/uL (ref 0.0–0.5)
Eosinophils Relative: 0 %
HCT: 34.9 % — ABNORMAL LOW (ref 36.0–46.0)
Hemoglobin: 10.5 g/dL — ABNORMAL LOW (ref 12.0–15.0)
Immature Granulocytes: 1 %
Lymphocytes Relative: 16 %
Lymphs Abs: 0.8 10*3/uL (ref 0.7–4.0)
MCH: 28 pg (ref 26.0–34.0)
MCHC: 30.1 g/dL (ref 30.0–36.0)
MCV: 93.1 fL (ref 80.0–100.0)
Monocytes Absolute: 0.9 10*3/uL (ref 0.1–1.0)
Monocytes Relative: 19 %
Neutro Abs: 3.1 10*3/uL (ref 1.7–7.7)
Neutrophils Relative %: 64 %
Platelets: 189 10*3/uL (ref 150–400)
RBC: 3.75 MIL/uL — ABNORMAL LOW (ref 3.87–5.11)
RDW: 14.2 % (ref 11.5–15.5)
WBC: 4.8 10*3/uL (ref 4.0–10.5)
nRBC: 0 % (ref 0.0–0.2)

## 2020-05-19 LAB — COMPREHENSIVE METABOLIC PANEL
ALT: 96 U/L — ABNORMAL HIGH (ref 0–44)
AST: 132 U/L — ABNORMAL HIGH (ref 15–41)
Albumin: 3.5 g/dL (ref 3.5–5.0)
Alkaline Phosphatase: 56 U/L (ref 38–126)
Anion gap: 8 (ref 5–15)
BUN: 8 mg/dL (ref 6–20)
CO2: 29 mmol/L (ref 22–32)
Calcium: 8.4 mg/dL — ABNORMAL LOW (ref 8.9–10.3)
Chloride: 96 mmol/L — ABNORMAL LOW (ref 98–111)
Creatinine, Ser: 0.7 mg/dL (ref 0.44–1.00)
GFR, Estimated: >60 mL/min (ref >60)
Glucose, Bld: 296 mg/dL — ABNORMAL HIGH (ref 70–99)
Potassium: 3.8 mmol/L (ref 3.5–5.1)
Sodium: 133 mmol/L — ABNORMAL LOW (ref 135–145)
Total Bilirubin: 0.4 mg/dL (ref 0.3–1.2)
Total Protein: 7.6 g/dL (ref 6.5–8.1)

## 2020-05-19 LAB — RESP PANEL BY RT-PCR (FLU A&B, COVID) ARPGX2
Influenza A by PCR: NEGATIVE
Influenza B by PCR: NEGATIVE
SARS Coronavirus 2 by RT PCR: POSITIVE — AB

## 2020-05-19 MED ORDER — SODIUM CHLORIDE 0.9 % IV SOLN
Freq: Once | INTRAVENOUS | Status: AC
  Filled 2020-05-19: qty 20

## 2020-05-19 MED ORDER — DIPHENHYDRAMINE HCL 50 MG/ML IJ SOLN: 50.0000 mg | Freq: Once | INTRAMUSCULAR | Status: DC | PRN

## 2020-05-19 MED ORDER — SODIUM CHLORIDE 0.9 % IV SOLN: 1200.0000 mg | Freq: Once | INTRAVENOUS | Status: DC

## 2020-05-19 MED ORDER — FAMOTIDINE IN NACL 20-0.9 MG/50ML-% IV SOLN: 20.0000 mg | Freq: Once | INTRAVENOUS | Status: DC | PRN

## 2020-05-19 MED ORDER — ALBUTEROL SULFATE HFA 108 (90 BASE) MCG/ACT IN AERS: 2.0000 | INHALATION_SPRAY | Freq: Once | RESPIRATORY_TRACT | Status: DC | PRN

## 2020-05-19 MED ORDER — SODIUM CHLORIDE 0.9 % IV BOLUS
1000.0000 mL | Freq: Once | INTRAVENOUS | Status: AC
  Administered 2020-05-19: 1000 mL via INTRAVENOUS

## 2020-05-19 MED ORDER — ACETAMINOPHEN 325 MG PO TABS
650.0000 mg | ORAL_TABLET | Freq: Once | ORAL | Status: AC
  Administered 2020-05-19: 650 mg via ORAL
  Filled 2020-05-19: qty 2

## 2020-05-19 MED ORDER — SODIUM CHLORIDE 0.9 % IV SOLN: INTRAVENOUS | Status: DC | PRN

## 2020-05-19 MED ORDER — IOHEXOL 350 MG/ML SOLN
100.0000 mL | Freq: Once | INTRAVENOUS | Status: AC | PRN
  Administered 2020-05-19: 100 mL via INTRAVENOUS

## 2020-05-19 MED ORDER — METHYLPREDNISOLONE SODIUM SUCC 125 MG IJ SOLR: 125.0000 mg | Freq: Once | INTRAMUSCULAR | Status: DC | PRN

## 2020-05-19 MED ORDER — EPINEPHRINE 0.3 MG/0.3ML IJ SOAJ: 0.3000 mg | Freq: Once | INTRAMUSCULAR | Status: DC | PRN

## 2020-05-19 NOTE — ED Provider Notes (Signed)
Care assumed from Carma Lair PA-C at shift change pending CTA and ambulation trial.  See her note for full H&P.   Briefly this is a 29 yo nvacinated female presenting with covid symptoms x 2 weeks.  If scan negative for PE and she ambulates without hypoxia can be discharged home with symptomatic care.  W/U by previous provider CBC without leukocytosis, hemoglobin consistent with baseline. CMP shows no significant electrolyte derangement, no renal insuffiencey, transaminitis, hyperglycemia with glucose 296. She is known diabetic. Pregnancy test negative. On ED arrival had temp 10.9, no hypoxia on room air. Chest xray negative for pneumonia. Se has been tachycardic, CTA ordered.  Physical Exam  BP (!) 151/120   Pulse (!) 112   Temp (!) 100.9 F (38.3 C) (Oral)   Resp 20   SpO2 93%    Physical Exam  PE: Constitutional: obese, well-developed, well-nourished, no apparent distress HENT: normocephalic, atraumatic. no cervical adenopathy Cardiovascular: normal rate and rhythm, distal pulses intact Pulmonary/Chest: effort normal; breath sounds clear and equal bilaterally; no wheezes or rales Abdominal: soft and nontender Musculoskeletal: full ROM, no edema Neurological: alert with goal directed thinking Skin: warm and dry, no rash, no diaphoresis Psychiatric: normal mood and affect, normal behavior    ED Course/Procedures     Results for orders placed or performed during the hospital encounter of 05/19/20 (from the past 24 hour(s))  CBC with Differential     Status: Abnormal   Collection Time: 05/19/20 11:21 AM  Result Value Ref Range   WBC 4.8 4.0 - 10.5 K/uL   RBC 3.75 (L) 3.87 - 5.11 MIL/uL   Hemoglobin 10.5 (L) 12.0 - 15.0 g/dL   HCT 95.6 (L) 36 - 46 %   MCV 93.1 80.0 - 100.0 fL   MCH 28.0 26.0 - 34.0 pg   MCHC 30.1 30.0 - 36.0 g/dL   RDW 21.3 08.6 - 57.8 %   Platelets 189 150 - 400 K/uL   nRBC 0.0 0.0 - 0.2 %   Neutrophils Relative % 64 %   Neutro Abs 3.1 1.7 - 7.7 K/uL    Lymphocytes Relative 16 %   Lymphs Abs 0.8 0.7 - 4.0 K/uL   Monocytes Relative 19 %   Monocytes Absolute 0.9 0.1 - 1.0 K/uL   Eosinophils Relative 0 %   Eosinophils Absolute 0.0 0.0 - 0.5 K/uL   Basophils Relative 0 %   Basophils Absolute 0.0 0.0 - 0.1 K/uL   Immature Granulocytes 1 %   Abs Immature Granulocytes 0.03 0.00 - 0.07 K/uL  Comprehensive metabolic panel     Status: Abnormal   Collection Time: 05/19/20 11:21 AM  Result Value Ref Range   Sodium 133 (L) 135 - 145 mmol/L   Potassium 3.8 3.5 - 5.1 mmol/L   Chloride 96 (L) 98 - 111 mmol/L   CO2 29 22 - 32 mmol/L   Glucose, Bld 296 (H) 70 - 99 mg/dL   BUN 8 6 - 20 mg/dL   Creatinine, Ser 4.69 0.44 - 1.00 mg/dL   Calcium 8.4 (L) 8.9 - 10.3 mg/dL   Total Protein 7.6 6.5 - 8.1 g/dL   Albumin 3.5 3.5 - 5.0 g/dL   AST 629 (H) 15 - 41 U/L   ALT 96 (H) 0 - 44 U/L   Alkaline Phosphatase 56 38 - 126 U/L   Total Bilirubin 0.4 0.3 - 1.2 mg/dL   GFR, Estimated >52 >84 mL/min   Anion gap 8 5 - 15  I-Stat Beta hCG blood,  ED ([facility], [facility], [facility] only)     Status: None   Collection Time: 05/19/20 12:09 PM  Result Value Ref Range   I-stat hCG, quantitative <5.0 <5 mIU/mL   Comment 3          Resp Panel by RT-PCR (Flu A&B, Covid) Nasopharyngeal Swab     Status: Abnormal   Collection Time: 05/19/20  2:56 PM   Specimen: Nasopharyngeal Swab; Nasopharyngeal(NP) swabs in vial transport medium  Result Value Ref Range   SARS Coronavirus 2 by RT PCR POSITIVE (A) NEGATIVE   Influenza A by PCR NEGATIVE NEGATIVE   Influenza B by PCR NEGATIVE NEGATIVE   CT ANGIOGRAPHY CHEST WITH CONTRAST    TECHNIQUE:  Multidetector CT imaging of the chest was performed using the  standard protocol during bolus administration of intravenous  contrast. Multiplanar CT image reconstructions and MIPs were  obtained to evaluate the vascular anatomy.    CONTRAST: OMNIPAQUE IOHEXOL 350 MG/ML SOLN    COMPARISON: 05/19/2020, 02/26/2020    FINDINGS:   Cardiovascular: This is a technically adequate evaluation of the  pulmonary vasculature. Evaluation is somewhat limited by patient  body habitus. No filling defects or pulmonary emboli.    The heart is unremarkable without pericardial effusion. Normal  caliber of the thoracic aorta.    Mediastinum/Nodes: No enlarged mediastinal, hilar, or axillary lymph  nodes. Thyroid gland, trachea, and esophagus demonstrate no  significant findings.    Lungs/Pleura: No acute pleural or parenchymal lung disease. Central  airways patent.    Upper Abdomen: No acute abnormality.    Musculoskeletal: No acute or destructive bony lesions. Reconstructed  images demonstrate no additional findings.    Review of the MIP images confirms the above findings.    IMPRESSION:  1. No evidence of pulmonary embolus.  2. No acute intrathoracic process.      Electronically Signed  By: Sharlet Salina M.D.  On: 05/19/2020 16:05   CHEST - 2 VIEW    COMPARISON: May 19, 2020    FINDINGS:  The study is improved given the PA and lateral technique the still  limited due to patient body habitus. The heart size is borderline to  mildly enlarged. The hila and mediastinum are unchanged. No  pneumothorax. No pulmonary nodules or masses. No overt edema. No  focal infiltrate identified.    IMPRESSION:  Limited study due to patient body habitus. No acute abnormality  noted.      Electronically Signed  By: Gerome Sam III M.D  On: 05/19/2020 14:03   PORTABLE CHEST 1 VIEW    COMPARISON: February 25, 2020    FINDINGS:  The study is limited due to patient body habitus and poor  penetration. Evaluation of the cardiac silhouette is limited on  portable imaging. The heart, hila, and mediastinum appear unchanged.  No pneumothorax. Opacity is not excluded in the right base. The left  base is incompletely visualized.    IMPRESSION:  The study is significantly limited due to  patient body habitus and  poor penetration. Opacity is not excluded in the right base and the  left base was not completely imaged. Recommend a PA and lateral  chest x-ray with better penetration.      Electronically Signed  By: Gerome Sam III M.D  On: 05/19/2020 12:21    EKG Interpretation  Date/Time:  Sunday May 19 2020 11:51:38 EST Ventricular Rate:  114 PR Interval:    QRS Duration: 117 QT Interval:  355 QTC Calculation: 489 R Axis:   -  10 Text Interpretation: Sinus tachycardia Incomplete RBBB and LAFB No significant change since last tracing Confirmed by Lorre Nick ([id number]) on 05/19/2020 12:58:58 PM         MDM  Patient received in sign out. Please see previous provider note.  CTA negative for PE or acute process.  Patient ambulated with without hypoxia or worsening tachycardia.  I evaluated the patient and asked how long her symptoms have been going on she states 7 to 9 days.  Based on that she is in the window for monoclonal antibody infusion.  Discussed with ED attending Dr. Denton Lank who agrees with plan for infusion.  Reassessed patient after the infusion and she is feeling better.  Engaged in shared decision making with patient and she feels that she can manage her symptoms at home.  She still is mildly tachycardic, heart rate ranging from 100-106.  Oxygen saturation is staying above 92%.  There are no indications for hospital admission at this time.  The patient appears reasonably screened and/or stabilized for discharge and I doubt any other medical condition or other [facility] requiring further screening, evaluation, or treatment in the ED at this time prior to discharge. The patient is safe for discharge with strict return precautions discussed. Recommend pcp follow up.  Also recommend follow-up with the post Covid clinic.  Ronnita Deridder was evaluated in Emergency Department on 05/19/2020 for the symptoms described in the history of present illness. She  was evaluated in the context of the global COVID-19 pandemic, which necessitated consideration that the patient might be at risk for infection with the SARS-CoV-2 virus that causes COVID-19. Institutional protocols and algorithms that pertain to the evaluation of patients at risk for COVID-19 are in a state of rapid change based on information released by regulatory bodies including the CDC and federal and state organizations. These policies and algorithms were followed during the patient's care in the ED.    Portions of this note were generated with Scientist, clinical (histocompatibility and immunogenetics). Dictation errors may occur despite best attempts at proofreading.    Shanon Ace, PA-C 05/19/20 2116    Cathren Laine, MD 05/20/20 [phone]

## 2020-05-19 NOTE — ED Triage Notes (Signed)
EMS reports from home, Pt c/o SOB, nasal congestion, generalized body aches and fever x 2 weeks. Denies Vaccinations. Pt states has asthma and used inhalers without result.  BP 158/89 HR 120 RR 22 Sp02 92 RA (100 2 ltrs O2)  Took Tylenol approx 0500 this morning

## 2020-05-19 NOTE — ED Provider Notes (Signed)
[facility]-EMERGENCY DEPT Provider Note   CSN: [id number] Arrival date & time: 05/19/20  1102     History Chief Complaint  Patient presents with  . Shortness of Breath  . Fever  . Generalized Body Aches    Yvette Gibbs is a 29 y.o. female.  HPI 29 year old female with a history of athma, depression, DMTII, obesity, OSA, HTN presents to the ER with complaints of 2 weeks of body aches, shortness of breath, weakness, nausea, vomiting, no diarrhea. Denies any chest pain. Denies pleuritic symptoms. She is not vaccinated. Has a history of asthma, has been using inhalers at home with no relief. Not on OCPs, no recent travel. 92% on RA by EMS, placed on 2L [place] and increased to 100%. Currently in the ED she is at 100% on RA at rest.  She states she called EMS because she did not know what else to do for her symptoms.    Past Medical History:  Diagnosis Date  . Asthma   . Depression   . Diabetes mellitus type 2, uncontrolled (HCC)   . Extreme obesity   . Family history of adverse reaction to anesthesia    " my grandmother had an allergic reaction  . Mental disorder   . Obesity   . OSA (obstructive sleep apnea)     Patient Active Problem List   Diagnosis Date Noted  . Gastroesophageal reflux disease without esophagitis 06/19/2019  . Loud snoring 06/19/2019  . Type 2 diabetes mellitus without complication, without long-term current use of insulin (HCC) 03/04/2019  . Abdominal pain 03/03/2019  . HTN (hypertension) 03/03/2019  . Hyperglycemia 03/03/2019  . Proteinuria 03/03/2019  . HCAP (healthcare-associated pneumonia) 09/07/2015  . Asthma 09/07/2015  . Pleuritic chest pain 09/01/2015  . Morbid obesity (HCC) 09/01/2015  . OSA (obstructive sleep apnea) 09/01/2015  . Influenza A 09/01/2015  . Biliary colic 09/04/2014    Past Surgical History:  Procedure Laterality Date  . APPENDECTOMY    . CHOLECYSTECTOMY N/A 09/05/2014   Procedure: LAPAROSCOPIC  CHOLECYSTECTOMY;  Surgeon: Axel Filler, MD;  Location: [facility] OR;  Service: General;  Laterality: N/A;  . TONSILLECTOMY       OB History   No obstetric history on file.     Family History  Problem Relation Age of Onset  . Diabetes Mellitus II Maternal Grandmother     Social History   Tobacco Use  . Smoking status: Never Smoker  . Smokeless tobacco: Never Used  Substance Use Topics  . Alcohol use: No  . Drug use: No    Home Medications Prior to Admission medications   Medication Sig Start Date End Date Taking? Authorizing Provider  acetaminophen (TYLENOL) 500 MG tablet Take 1,000 mg by mouth every 6 (six) hours as needed for mild pain or fever.   Yes [provider]  albuterol (VENTOLIN HFA) 108 (90 Base) MCG/ACT inhaler Inhale 1-2 puffs into the lungs every 6 (six) hours as needed for wheezing or shortness of breath. 03/21/19  Yes Marcine Matar, MD  guaiFENesin-dextromethorphan (ROBITUSSIN DM) 100-10 MG/5ML syrup Take 5 mLs by mouth every 4 (four) hours as needed for cough. Patient not taking: Reported on 05/19/2020 03/04/19   Dhungel, Theda Belfast, MD  metFORMIN (GLUCOPHAGE) 500 MG tablet Take 1 tablet (500 mg total) by mouth 2 (two) times daily with a meal. Patient not taking: Reported on 02/26/2020 06/19/19   Marcine Matar, MD  omeprazole (PRILOSEC) 20 MG capsule Take 1 capsule (20 mg total)  by mouth daily. Patient not taking: Reported on 02/26/2020 06/19/19   Marcine Matar, MD    Allergies    Lisinopril and Fish allergy  Review of Systems   Review of Systems  Constitutional: Positive for activity change, appetite change, chills, fatigue and fever.  Respiratory: Positive for cough and shortness of breath.   Cardiovascular: Negative for chest pain, palpitations and leg swelling.  Gastrointestinal: Positive for diarrhea, nausea and vomiting. Negative for abdominal pain.  Genitourinary: Negative for dysuria.  Neurological: Positive for weakness.    Physical  Exam Updated Vital Signs BP (!) 227/96   Pulse (!) 118   Temp (!) 100.9 F (38.3 C) (Oral)   Resp (!) 22   SpO2 92%   Physical Exam Vitals and nursing note reviewed.  Constitutional:      General: She is not in acute distress.    Appearance: She is well-developed. She is obese. She is not ill-appearing or diaphoretic.  HENT:     Head: Normocephalic and atraumatic.  Eyes:     Conjunctiva/sclera: Conjunctivae normal.  Cardiovascular:     Rate and Rhythm: Regular rhythm. Tachycardia present.     Heart sounds: No murmur heard.   Pulmonary:     Effort: Pulmonary effort is normal. No respiratory distress.     Breath sounds: No decreased breath sounds, wheezing or rhonchi.  Chest:     Chest wall: No tenderness.  Abdominal:     Palpations: Abdomen is soft.     Tenderness: There is no abdominal tenderness.  Musculoskeletal:        General: Normal range of motion.     Cervical back: Neck supple.     Right lower leg: No tenderness. No edema.     Left lower leg: No tenderness. No edema.  Skin:    General: Skin is warm and dry.  Neurological:     General: No focal deficit present.     Mental Status: She is alert.  Psychiatric:        Mood and Affect: Mood normal.        Behavior: Behavior normal.     ED Results / Procedures / Treatments   Labs (all labs ordered are listed, but only abnormal results are displayed) Labs Reviewed  CBC WITH DIFFERENTIAL/PLATELET - Abnormal; Notable for the following components:      Result Value   RBC 3.75 (*)    Hemoglobin 10.5 (*)    HCT 34.9 (*)    All other components within normal limits  COMPREHENSIVE METABOLIC PANEL - Abnormal; Notable for the following components:   Sodium 133 (*)    Chloride 96 (*)    Glucose, Bld 296 (*)    Calcium 8.4 (*)    AST 132 (*)    ALT 96 (*)    All other components within normal limits  RESP PANEL BY RT-PCR (FLU A&B, COVID) ARPGX2  I-STAT BETA HCG BLOOD, ED ([facility], [facility], [facility] ONLY)    EKG EKG  Interpretation  Date/Time:  Sunday May 19 2020 11:51:38 EST Ventricular Rate:  114 PR Interval:    QRS Duration: 117 QT Interval:  355 QTC Calculation: 489 R Axis:   -10 Text Interpretation: Sinus tachycardia Incomplete RBBB and LAFB No significant change since last tracing Confirmed by Lorre Nick ([id number]) on 05/19/2020 12:58:58 PM   Radiology DG Chest 2 View  Result Date: 05/19/2020 CLINICAL DATA:  Shortness of breath and cough. EXAM: CHEST - 2 VIEW COMPARISON:  May 19, 2020 FINDINGS:  The study is improved given the PA and lateral technique the still limited due to patient body habitus. The heart size is borderline to mildly enlarged. The hila and mediastinum are unchanged. No pneumothorax. No pulmonary nodules or masses. No overt edema. No focal infiltrate identified. IMPRESSION: Limited study due to patient body habitus. No acute abnormality noted. Electronically Signed   By: Gerome Sam III M.D   On: 05/19/2020 14:03   DG Chest Portable 1 View  Result Date: 05/19/2020 CLINICAL DATA:  Shortness of breath.  Nasal congestion. EXAM: PORTABLE CHEST 1 VIEW COMPARISON:  February 25, 2020 FINDINGS: The study is limited due to patient body habitus and poor penetration. Evaluation of the cardiac silhouette is limited on portable imaging. The heart, hila, and mediastinum appear unchanged. No pneumothorax. Opacity is not excluded in the right base. The left base is incompletely visualized. IMPRESSION: The study is significantly limited due to patient body habitus and poor penetration. Opacity is not excluded in the right base and the left base was not completely imaged. Recommend a PA and lateral chest x-ray with better penetration. Electronically Signed   By: Gerome Sam III M.D   On: 05/19/2020 12:21    Procedures Procedures (including critical care time)  Medications Ordered in ED Medications  acetaminophen (TYLENOL) tablet 650 mg (has no administration in time range)     ED Course  I have reviewed the triage vital signs and the nursing notes.  Pertinent labs & imaging results that were available during my care of the patient were reviewed by me and considered in my medical decision making (see chart for details).    MDM Rules/Calculators/A&P                         29 year old female with body aches, fevers, chills, shortness of breath, cough Presentation, she is alert, oriented, nontoxic-appearing, no acute distress, speaking full sentences without increased work of breathing.  Vitals on arrival with tachycardia as high as 120, febrile with a temperature of 100.9, mildly hypertensive.  Sats at 100% on room air.  Lung sounds clear, abdomen soft and nontender.  Lab work reviewed interpreted by me, CBC without leukocytosis, hemoglobin of 10.5.  CMP with mild hyponatremia, glucose of 296, transaminitis of AST of 132 and ALT of 96.  Pregnancy is negative.  Chest x-ray without acute evidence of pneumonia.  Will ambulate with O2 and give Tylenol.  EKG with sinus tach.  No evidence of ischemia.  Given persistent tachycardia, will order CT PE study as per discussion with my supervising physician Dr. Freida Busman.  She is outside the window for an MAB infusion.    Signed out care to [facility] who will oversee her CT scan and dispo accordingly.  If her CT scan is negative and she can ambulate with adequate oxygen saturations, suspect that the patient will be stable for discharge.  If not, she will need admission.   Final Clinical Impression(s) / ED Diagnoses Final diagnoses:  SOB (shortness of breath)    Rx / DC Orders ED Discharge Orders    None       Leone Brand 05/19/20 1513    Lorre Nick, MD 05/20/20 1330

## 2020-05-19 NOTE — Discharge Instructions (Addendum)
Thank you for allowing us to care for you today.   Please return to the emergency department if you have any new or worsening symptoms.  You tested positive for covid-19 today.   Medications- You can take medications to help treat your symptoms: -Tylenol for fever and body aches. Please take as prescribed on the bottle. -Over the coutner cough medicine such as mucinex, robitussin, or other brands. -Flonase or saline nasal spray for nasal congestion -Vitamins as recommended by CDC  Treatment- This is a virus and unfortunately there are no antibitotics approved to treat this virus at this time. It is important to monitor your symptoms closely: -You should have a theremometer at home to check your temperature when feeling feverish. -Use a pulse ox meter to measure your oxygen when feeling short of breath.  -If your fever is over 100.4 despite taking tylenol or if your oxygen level drops below 94% these are reasons to return to the emergency department for further evaluation.   -You need to quarantine for 10 days starting today.  You can return to work, school or normal activities if on day 10 you are fever free without the use of Tylenol or ibuprofen.  You will need to continue quarantine if you still have a fever over 100.4.  Again: symptoms of shortness of breath, chest pain, difficulty breathing, new onset of confusion, any symptoms that are concerning. If any of these symptoms you should come to emergency department for evaluation.   I hope you feel better soon   

## 2020-05-21 ENCOUNTER — Emergency Department (HOSPITAL_COMMUNITY): Payer: Medicaid Other

## 2020-05-21 ENCOUNTER — Encounter (HOSPITAL_COMMUNITY): Payer: Self-pay | Admitting: Emergency Medicine

## 2020-05-21 ENCOUNTER — Inpatient Hospital Stay (HOSPITAL_COMMUNITY)
Admission: EM | Admit: 2020-05-21 | Discharge: 2020-05-25 | DRG: 177 | Disposition: A | Discharge status: Home / Self Care | Payer: Medicaid Other | Attending: Internal Medicine | Admitting: Internal Medicine

## 2020-05-21 DIAGNOSIS — Z743 Need for continuous supervision: Secondary | ICD-10-CM | POA: Diagnosis not present

## 2020-05-21 DIAGNOSIS — J159 Unspecified bacterial pneumonia: Secondary | ICD-10-CM | POA: Diagnosis not present

## 2020-05-21 DIAGNOSIS — J45909 Unspecified asthma, uncomplicated: Secondary | ICD-10-CM | POA: Diagnosis not present

## 2020-05-21 DIAGNOSIS — J9602 Acute respiratory failure with hypercapnia: Secondary | ICD-10-CM | POA: Diagnosis present

## 2020-05-21 DIAGNOSIS — R0602 Shortness of breath: Secondary | ICD-10-CM | POA: Diagnosis not present

## 2020-05-21 DIAGNOSIS — Z6841 Body Mass Index (BMI) 40.0 and over, adult: Secondary | ICD-10-CM

## 2020-05-21 DIAGNOSIS — J1282 Pneumonia due to coronavirus disease 2019: Secondary | ICD-10-CM | POA: Diagnosis not present

## 2020-05-21 DIAGNOSIS — E119 Type 2 diabetes mellitus without complications: Secondary | ICD-10-CM | POA: Diagnosis not present

## 2020-05-21 DIAGNOSIS — R739 Hyperglycemia, unspecified: Secondary | ICD-10-CM | POA: Diagnosis present

## 2020-05-21 DIAGNOSIS — T380X5A Adverse effect of glucocorticoids and synthetic analogues, initial encounter: Secondary | ICD-10-CM | POA: Diagnosis present

## 2020-05-21 DIAGNOSIS — J9600 Acute respiratory failure, unspecified whether with hypoxia or hypercapnia: Secondary | ICD-10-CM | POA: Diagnosis not present

## 2020-05-21 DIAGNOSIS — G4733 Obstructive sleep apnea (adult) (pediatric): Secondary | ICD-10-CM | POA: Diagnosis present

## 2020-05-21 DIAGNOSIS — I10 Essential (primary) hypertension: Secondary | ICD-10-CM | POA: Diagnosis present

## 2020-05-21 DIAGNOSIS — A419 Sepsis, unspecified organism: Secondary | ICD-10-CM | POA: Diagnosis present

## 2020-05-21 DIAGNOSIS — R6520 Severe sepsis without septic shock: Secondary | ICD-10-CM | POA: Diagnosis present

## 2020-05-21 DIAGNOSIS — E1165 Type 2 diabetes mellitus with hyperglycemia: Secondary | ICD-10-CM | POA: Diagnosis not present

## 2020-05-21 DIAGNOSIS — Z833 Family history of diabetes mellitus: Secondary | ICD-10-CM

## 2020-05-21 DIAGNOSIS — K219 Gastro-esophageal reflux disease without esophagitis: Secondary | ICD-10-CM | POA: Diagnosis present

## 2020-05-21 DIAGNOSIS — J9601 Acute respiratory failure with hypoxia: Secondary | ICD-10-CM | POA: Diagnosis present

## 2020-05-21 DIAGNOSIS — R918 Other nonspecific abnormal finding of lung field: Secondary | ICD-10-CM | POA: Diagnosis not present

## 2020-05-21 DIAGNOSIS — E66813 Obesity, class 3: Secondary | ICD-10-CM | POA: Diagnosis present

## 2020-05-21 DIAGNOSIS — U071 COVID-19: Principal | ICD-10-CM | POA: Diagnosis present

## 2020-05-21 LAB — CBC WITH DIFFERENTIAL/PLATELET
Abs Immature Granulocytes: 0.04 10*3/uL (ref 0.00–0.07)
Basophils Absolute: 0 10*3/uL (ref 0.0–0.1)
Basophils Relative: 0 %
Eosinophils Absolute: 0 10*3/uL (ref 0.0–0.5)
Eosinophils Relative: 0 %
HCT: 34.1 % — ABNORMAL LOW (ref 36.0–46.0)
Hemoglobin: 10.5 g/dL — ABNORMAL LOW (ref 12.0–15.0)
Immature Granulocytes: 1 %
Lymphocytes Relative: 13 %
Lymphs Abs: 1.1 10*3/uL (ref 0.7–4.0)
MCH: 28.2 pg (ref 26.0–34.0)
MCHC: 30.8 g/dL (ref 30.0–36.0)
MCV: 91.4 fL (ref 80.0–100.0)
Monocytes Absolute: 0.7 10*3/uL (ref 0.1–1.0)
Monocytes Relative: 8 %
Neutro Abs: 6.6 10*3/uL (ref 1.7–7.7)
Neutrophils Relative %: 78 %
Platelets: 185 10*3/uL (ref 150–400)
RBC: 3.73 MIL/uL — ABNORMAL LOW (ref 3.87–5.11)
RDW: 14.6 % (ref 11.5–15.5)
WBC: 8.4 10*3/uL (ref 4.0–10.5)
nRBC: 0 % (ref 0.0–0.2)

## 2020-05-21 LAB — FERRITIN: Ferritin: 225 ng/mL (ref 11–307)

## 2020-05-21 LAB — COMPREHENSIVE METABOLIC PANEL
ALT: 48 U/L — ABNORMAL HIGH (ref 0–44)
AST: 48 U/L — ABNORMAL HIGH (ref 15–41)
Albumin: 3.2 g/dL — ABNORMAL LOW (ref 3.5–5.0)
Alkaline Phosphatase: 54 U/L (ref 38–126)
Anion gap: 9 (ref 5–15)
BUN: 7 mg/dL (ref 6–20)
CO2: 30 mmol/L (ref 22–32)
Calcium: 8.1 mg/dL — ABNORMAL LOW (ref 8.9–10.3)
Chloride: 94 mmol/L — ABNORMAL LOW (ref 98–111)
Creatinine, Ser: 0.75 mg/dL (ref 0.44–1.00)
GFR, Estimated: >60 mL/min (ref >60)
Glucose, Bld: 323 mg/dL — ABNORMAL HIGH (ref 70–99)
Potassium: 3.5 mmol/L (ref 3.5–5.1)
Sodium: 133 mmol/L — ABNORMAL LOW (ref 135–145)
Total Bilirubin: 0.7 mg/dL (ref 0.3–1.2)
Total Protein: 7.7 g/dL (ref 6.5–8.1)

## 2020-05-21 LAB — I-STAT BETA HCG BLOOD, ED (MC, WL, AP ONLY): I-stat hCG, quantitative: <5 m[IU]/mL (ref <5)

## 2020-05-21 LAB — LACTIC ACID, PLASMA: Lactic Acid, Venous: 1.1 mmol/L (ref 0.5–1.9)

## 2020-05-21 LAB — PROCALCITONIN: Procalcitonin: 0.26 ng/mL

## 2020-05-21 LAB — TRIGLYCERIDES: Triglycerides: 125 mg/dL (ref <150)

## 2020-05-21 LAB — C-REACTIVE PROTEIN: CRP: 17.8 mg/dL — ABNORMAL HIGH (ref <1.0)

## 2020-05-21 LAB — D-DIMER, QUANTITATIVE: D-Dimer, Quant: 0.47 ug/mL-FEU (ref 0.00–0.50)

## 2020-05-21 LAB — LACTATE DEHYDROGENASE: LDH: 420 U/L — ABNORMAL HIGH (ref 98–192)

## 2020-05-21 LAB — FIBRINOGEN: Fibrinogen: 723 mg/dL — ABNORMAL HIGH (ref 210–475)

## 2020-05-21 LAB — GLUCOSE, CAPILLARY: Glucose-Capillary: 419 mg/dL — ABNORMAL HIGH (ref 70–99)

## 2020-05-21 MED ORDER — BARICITINIB 2 MG PO TABS
4.0000 mg | ORAL_TABLET | Freq: Every day | ORAL | Status: DC
  Administered 2020-05-21 – 2020-05-25 (×5): 4 mg via ORAL
  Filled 2020-05-21 (×5): qty 2

## 2020-05-21 MED ORDER — SODIUM CHLORIDE 0.9 % IV SOLN
500.0000 mg | INTRAVENOUS | Status: DC
  Administered 2020-05-22: 500 mg via INTRAVENOUS
  Filled 2020-05-21 (×2): qty 500

## 2020-05-21 MED ORDER — BARICITINIB 2 MG PO TABS: 2.0000 mg | ORAL_TABLET | Freq: Every day | ORAL | Status: DC

## 2020-05-21 MED ORDER — LINAGLIPTIN 5 MG PO TABS
5.0000 mg | ORAL_TABLET | Freq: Every day | ORAL | Status: DC
  Administered 2020-05-21 – 2020-05-25 (×5): 5 mg via ORAL
  Filled 2020-05-21 (×5): qty 1

## 2020-05-21 MED ORDER — IPRATROPIUM-ALBUTEROL 20-100 MCG/ACT IN AERS
1.0000 | INHALATION_SPRAY | Freq: Four times a day (QID) | RESPIRATORY_TRACT | Status: DC
  Administered 2020-05-21 – 2020-05-25 (×14): 1 via RESPIRATORY_TRACT
  Filled 2020-05-21: qty 4

## 2020-05-21 MED ORDER — SODIUM CHLORIDE 0.9 % IV SOLN
100.0000 mg | Freq: Every day | INTRAVENOUS | Status: AC
  Administered 2020-05-22 – 2020-05-25 (×4): 100 mg via INTRAVENOUS
  Filled 2020-05-21 (×6): qty 20

## 2020-05-21 MED ORDER — SODIUM CHLORIDE 0.9 % IV SOLN
200.0000 mg | Freq: Once | INTRAVENOUS | Status: AC
  Administered 2020-05-21: 200 mg via INTRAVENOUS
  Filled 2020-05-21: qty 200

## 2020-05-21 MED ORDER — INSULIN ASPART 100 UNIT/ML ~~LOC~~ SOLN
0.0000 [IU] | Freq: Every day | SUBCUTANEOUS | Status: DC
  Administered 2020-05-22: 4 [IU] via SUBCUTANEOUS
  Administered 2020-05-23 – 2020-05-24 (×2): 5 [IU] via SUBCUTANEOUS
  Filled 2020-05-21: qty 0.05

## 2020-05-21 MED ORDER — ONDANSETRON HCL 4 MG PO TABS: 4.0000 mg | ORAL_TABLET | Freq: Four times a day (QID) | ORAL | Status: DC | PRN

## 2020-05-21 MED ORDER — INSULIN ASPART 100 UNIT/ML ~~LOC~~ SOLN
0.0000 [IU] | Freq: Three times a day (TID) | SUBCUTANEOUS | Status: DC
  Administered 2020-05-22: 20 [IU] via SUBCUTANEOUS
  Administered 2020-05-22: 15 [IU] via SUBCUTANEOUS
  Administered 2020-05-22 – 2020-05-24 (×5): 20 [IU] via SUBCUTANEOUS
  Administered 2020-05-24: 7 [IU] via SUBCUTANEOUS
  Administered 2020-05-24: 20 [IU] via SUBCUTANEOUS
  Administered 2020-05-25: 11 [IU] via SUBCUTANEOUS
  Filled 2020-05-21: qty 0.2

## 2020-05-21 MED ORDER — DEXAMETHASONE SODIUM PHOSPHATE 10 MG/ML IJ SOLN
6.0000 mg | INTRAMUSCULAR | Status: DC
  Administered 2020-05-22: 6 mg via INTRAVENOUS
  Filled 2020-05-21: qty 1

## 2020-05-21 MED ORDER — HYDROCOD POLST-CPM POLST ER 10-8 MG/5ML PO SUER
5.0000 mL | Freq: Two times a day (BID) | ORAL | Status: DC | PRN
  Administered 2020-05-22: 5 mL via ORAL
  Filled 2020-05-21: qty 5

## 2020-05-21 MED ORDER — GUAIFENESIN-DM 100-10 MG/5ML PO SYRP: 10.0000 mL | ORAL_SOLUTION | ORAL | Status: DC | PRN

## 2020-05-21 MED ORDER — SODIUM CHLORIDE 0.9 % IV SOLN: 200.0000 mg | Freq: Once | INTRAVENOUS | Status: DC

## 2020-05-21 MED ORDER — ACETAMINOPHEN 325 MG PO TABS
650.0000 mg | ORAL_TABLET | Freq: Once | ORAL | Status: AC
  Administered 2020-05-21: 650 mg via ORAL
  Filled 2020-05-21: qty 2

## 2020-05-21 MED ORDER — ACETAMINOPHEN 325 MG PO TABS
650.0000 mg | ORAL_TABLET | Freq: Four times a day (QID) | ORAL | Status: DC | PRN
  Administered 2020-05-23 – 2020-05-24 (×3): 650 mg via ORAL
  Filled 2020-05-21 (×3): qty 2

## 2020-05-21 MED ORDER — INSULIN ASPART 100 UNIT/ML ~~LOC~~ SOLN
6.0000 [IU] | Freq: Once | SUBCUTANEOUS | Status: AC
  Administered 2020-05-22: 6 [IU] via SUBCUTANEOUS

## 2020-05-21 MED ORDER — ENOXAPARIN SODIUM 80 MG/0.8ML ~~LOC~~ SOLN
70.0000 mg | SUBCUTANEOUS | Status: DC
  Administered 2020-05-21: 70 mg via SUBCUTANEOUS
  Filled 2020-05-21: qty 0.8
  Filled 2020-05-21: qty 0.7

## 2020-05-21 MED ORDER — SODIUM CHLORIDE 0.9 % IV SOLN: 100.0000 mg | Freq: Every day | INTRAVENOUS | Status: DC

## 2020-05-21 MED ORDER — ONDANSETRON HCL 4 MG/2ML IJ SOLN: 4.0000 mg | Freq: Four times a day (QID) | INTRAMUSCULAR | Status: DC | PRN

## 2020-05-21 MED ORDER — DEXAMETHASONE SODIUM PHOSPHATE 10 MG/ML IJ SOLN
10.0000 mg | Freq: Once | INTRAMUSCULAR | Status: AC
  Administered 2020-05-21: 10 mg via INTRAVENOUS
  Filled 2020-05-21: qty 1

## 2020-05-21 MED ORDER — INSULIN DETEMIR 100 UNIT/ML ~~LOC~~ SOLN
0.1500 [IU]/kg | Freq: Two times a day (BID) | SUBCUTANEOUS | Status: DC
  Administered 2020-05-21 – 2020-05-22 (×2): 21 [IU] via SUBCUTANEOUS
  Filled 2020-05-21 (×2): qty 0.21

## 2020-05-21 NOTE — ED Provider Notes (Signed)
[facility]-EMERGENCY DEPT Provider Note   CSN: [id number] Arrival date & time: 05/21/20  1830     History Chief Complaint  Patient presents with  . Shortness of Breath  . Fever  . Generalized Body Aches    Yvette Gibbs is a 29 y.o. female.   Shortness of Breath Severity:  Moderate Onset quality:  Gradual Duration:  2 weeks Timing:  Constant Progression:  Worsening Chronicity:  New Relieved by:  Nothing Worsened by:  Nothing Associated symptoms: cough, fever, headaches and wheezing   Associated symptoms: no chest pain, no rash and no vomiting   Fever Associated symptoms: cough, headaches and myalgias   Associated symptoms: no chest pain, no chills, no congestion, no diarrhea, no dysuria, no nausea, no rash, no rhinorrhea and no vomiting        Past Medical History:  Diagnosis Date  . Asthma   . Depression   . Diabetes mellitus type 2, uncontrolled (HCC)   . Extreme obesity   . Family history of adverse reaction to anesthesia    " my grandmother had an allergic reaction  . Hypertension   . Mental disorder   . Obesity   . OSA (obstructive sleep apnea)     Patient Active Problem List   Diagnosis Date Noted  . Acute respiratory failure due to COVID-19 (HCC) 05/21/2020  . Gastroesophageal reflux disease without esophagitis 06/19/2019  . Loud snoring 06/19/2019  . Type 2 diabetes mellitus without complication, without long-term current use of insulin (HCC) 03/04/2019  . Abdominal pain 03/03/2019  . HTN (hypertension) 03/03/2019  . Hyperglycemia 03/03/2019  . Proteinuria 03/03/2019  . HCAP (healthcare-associated pneumonia) 09/07/2015  . Severe sepsis (HCC) 09/07/2015  . Asthma 09/07/2015  . Pleuritic chest pain 09/01/2015  . Morbid obesity (HCC) 09/01/2015  . OSA (obstructive sleep apnea) 09/01/2015  . Influenza A 09/01/2015  . Biliary colic 09/04/2014    Past Surgical History:  Procedure Laterality Date  . APPENDECTOMY    .  CHOLECYSTECTOMY N/A 09/05/2014   Procedure: LAPAROSCOPIC CHOLECYSTECTOMY;  Surgeon: Axel Filler, MD;  Location: [facility] OR;  Service: General;  Laterality: N/A;  . TONSILLECTOMY       OB History   No obstetric history on file.     Family History  Problem Relation Age of Onset  . Diabetes Mellitus II Maternal Grandmother     Social History   Tobacco Use  . Smoking status: Never Smoker  . Smokeless tobacco: Never Used  Vaping Use  . Vaping Use: Never used  Substance Use Topics  . Alcohol use: No  . Drug use: No    Home Medications Prior to Admission medications   Medication Sig Start Date End Date Taking? Authorizing Provider  acetaminophen (TYLENOL) 500 MG tablet Take 1,000 mg by mouth every 6 (six) hours as needed for mild pain or fever.   Yes [provider]  albuterol (VENTOLIN HFA) 108 (90 Base) MCG/ACT inhaler Inhale 1-2 puffs into the lungs every 6 (six) hours as needed for wheezing or shortness of breath. 03/21/19  Yes Marcine Matar, MD  loratadine (CLARITIN) 10 MG tablet Take 10 mg by mouth daily as needed for allergies.   Yes [provider]  guaiFENesin-dextromethorphan (ROBITUSSIN DM) 100-10 MG/5ML syrup Take 5 mLs by mouth every 4 (four) hours as needed for cough. Patient not taking: Reported on 05/19/2020 03/04/19   Dhungel, Theda Belfast, MD  metFORMIN (GLUCOPHAGE) 500 MG tablet Take 1 tablet (500 mg total) by mouth 2 (two)  times daily with a meal. Patient not taking: Reported on 02/26/2020 06/19/19   Marcine Matar, MD  omeprazole (PRILOSEC) 20 MG capsule Take 1 capsule (20 mg total) by mouth daily. Patient not taking: Reported on 02/26/2020 06/19/19   Marcine Matar, MD  PRESCRIPTION MEDICATION Inhale 2 puffs into the lungs every 4 (four) hours as needed (wheezing &shortnes of breath). Patient not sure about the name    [provider]    Allergies    Lisinopril and Fish allergy  Review of Systems   Review of Systems   Constitutional: Positive for fever. Negative for chills.  HENT: Negative for congestion and rhinorrhea.   Respiratory: Positive for cough, shortness of breath and wheezing.   Cardiovascular: Negative for chest pain and palpitations.  Gastrointestinal: Negative for diarrhea, nausea and vomiting.  Genitourinary: Negative for difficulty urinating and dysuria.  Musculoskeletal: Positive for myalgias. Negative for arthralgias and back pain.  Skin: Negative for rash and wound.  Neurological: Positive for headaches. Negative for light-headedness.    Physical Exam Updated Vital Signs BP (!) (P) 159/85   Pulse (P) 95   Temp (P) 99.8 F (37.7 C) (Oral)   Resp (!) 24   Ht 5\' 2"  (1.575 m)   Wt (!) 174.6 kg   SpO2 (P) 100%   BMI 70.42 kg/m   Physical Exam Vitals and nursing note reviewed. Exam conducted with a chaperone present.  Constitutional:      General: She is not in acute distress.    Appearance: Normal appearance.  HENT:     Head: Normocephalic and atraumatic.     Nose: No rhinorrhea.  Eyes:     General:        Right eye: No discharge.        Left eye: No discharge.     Conjunctiva/sclera: Conjunctivae normal.  Cardiovascular:     Rate and Rhythm: Normal rate and regular rhythm.  Pulmonary:     Effort: Pulmonary effort is normal. Tachypnea present.     Breath sounds: No stridor. Decreased breath sounds, wheezing and rhonchi present.  Abdominal:     General: Abdomen is flat. There is no distension.     Palpations: Abdomen is soft.  Musculoskeletal:        General: No tenderness or signs of injury.  Skin:    General: Skin is warm and dry.  Neurological:     General: No focal deficit present.     Mental Status: She is alert. Mental status is at baseline.     Motor: No weakness.  Psychiatric:        Mood and Affect: Mood normal.        Behavior: Behavior normal.     ED Results / Procedures / Treatments   Labs (all labs ordered are listed, but only abnormal  results are displayed) Labs Reviewed  CBC WITH DIFFERENTIAL/PLATELET - Abnormal; Notable for the following components:      Result Value   RBC 3.73 (*)    Hemoglobin 10.5 (*)    HCT 34.1 (*)    All other components within normal limits  COMPREHENSIVE METABOLIC PANEL - Abnormal; Notable for the following components:   Sodium 133 (*)    Chloride 94 (*)    Glucose, Bld 323 (*)    Calcium 8.1 (*)    Albumin 3.2 (*)    AST 48 (*)    ALT 48 (*)    All other components within normal limits  LACTATE DEHYDROGENASE -  Abnormal; Notable for the following components:   LDH 420 (*)    All other components within normal limits  FIBRINOGEN - Abnormal; Notable for the following components:   Fibrinogen 723 (*)    All other components within normal limits  C-REACTIVE PROTEIN - Abnormal; Notable for the following components:   CRP 17.8 (*)    All other components within normal limits  CULTURE, BLOOD (ROUTINE X 2)  CULTURE, BLOOD (ROUTINE X 2)  LACTIC ACID, PLASMA  D-DIMER, QUANTITATIVE (NOT AT [facility])  PROCALCITONIN  FERRITIN  TRIGLYCERIDES  HIV ANTIBODY (ROUTINE TESTING W REFLEX)  CBC WITH DIFFERENTIAL/PLATELET  COMPREHENSIVE METABOLIC PANEL  C-REACTIVE PROTEIN  D-DIMER, QUANTITATIVE (NOT AT [facility])  FERRITIN  MAGNESIUM  PHOSPHORUS  I-STAT BETA HCG BLOOD, ED ([facility], [facility], [facility] ONLY)    EKG None  Radiology DG Chest Port 1 View  Result Date: 05/21/2020 CLINICAL DATA:  COVID diagnosis EXAM: PORTABLE CHEST 1 VIEW COMPARISON:  May 19, 2020 FINDINGS: The lung volumes are low. There are diffuse hazy bilateral airspace opacities that are new from prior study. Heart size is enlarged. There is no pneumothorax. No large pleural effusion. Evaluation is limited by patient body habitus. IMPRESSION: New diffuse hazy bilateral airspace opacities consistent with multifocal pneumonia (viral or bacterial). Electronically Signed   By: Katherine Mantle M.D.   On: 05/21/2020 20:12    Procedures .Critical  Care Performed by: Sabino Donovan, MD Authorized by: Sabino Donovan, MD   Critical care provider statement:    Critical care time (minutes):  45   Critical care was necessary to treat or prevent imminent or life-threatening deterioration of the following conditions:  Respiratory failure   Critical care was time spent personally by me on the following activities:  Discussions with consultants, evaluation of patient's response to treatment, examination of patient, ordering and performing treatments and interventions, ordering and review of laboratory studies, ordering and review of radiographic studies, pulse oximetry, re-evaluation of patient's condition, obtaining history from patient or surrogate, review of old charts, blood draw for specimens and development of treatment plan with patient or surrogate   (including critical care time)  Medications Ordered in ED Medications  remdesivir 200 mg in sodium chloride 0.9% 250 mL IVPB (0 mg Intravenous Stopped 05/21/20 2115)    Followed by  remdesivir 100 mg in sodium chloride 0.9 % 100 mL IVPB (has no administration in time range)  enoxaparin (LOVENOX) injection 70 mg (70 mg Subcutaneous Given 05/21/20 2230)  Ipratropium-Albuterol (COMBIVENT) respimat 1 puff (has no administration in time range)  dexamethasone (DECADRON) injection 6 mg ( Intravenous Canceled Entry 05/21/20 2202)  guaiFENesin-dextromethorphan (ROBITUSSIN DM) 100-10 MG/5ML syrup 10 mL (has no administration in time range)  chlorpheniramine-HYDROcodone (TUSSIONEX) 10-8 MG/5ML suspension 5 mL (has no administration in time range)  insulin detemir (LEVEMIR) injection 21 Units (21 Units Subcutaneous Given 05/21/20 2230)  insulin aspart (novoLOG) injection 0-20 Units (has no administration in time range)  insulin aspart (novoLOG) injection 0-5 Units (has no administration in time range)  acetaminophen (TYLENOL) tablet 650 mg (has no administration in time range)  ondansetron (ZOFRAN) tablet 4 mg  (has no administration in time range)    Or  ondansetron (ZOFRAN) injection 4 mg (has no administration in time range)  azithromycin (ZITHROMAX) 500 mg in sodium chloride 0.9 % 250 mL IVPB (has no administration in time range)  linagliptin (TRADJENTA) tablet 5 mg (5 mg Oral Given 05/21/20 2229)  baricitinib (OLUMIANT) tablet 4 mg (4 mg Oral Given 05/21/20  2228)  dexamethasone (DECADRON) injection 10 mg (10 mg Intravenous Given 05/21/20 2028)  acetaminophen (TYLENOL) tablet 650 mg (650 mg Oral Given 05/21/20 2114)    ED Course  I have reviewed the triage vital signs and the nursing notes.  Pertinent labs & imaging results that were available during my care of the patient were reviewed by me and considered in my medical decision making (see chart for details).    MDM Rules/Calculators/A&P                          Diagnosed with Covid 2 days ago.  Acute hypoxic respiratory failure today.  We will repeat labs we will repeat imaging.  On 5 L nasal cannula.  Oxygen levels as low as 70 with EMS, on nonrebreather transition here.  Patient is not vaccinated patient has a history of asthma.  Will get Decadron will get remdesivir will need admission.   CRITICAL CARE Performed by: Sabino DonovanEric C Nobie Alleyne   Total critical care time: 45 minutes  Critical care time was exclusive of separately billable procedures and treating other patients.  Critical care was necessary to treat or prevent imminent or life-threatening deterioration.  Critical care was time spent personally by me on the following activities: development of treatment plan with patient and/or surrogate as well as nursing, discussions with consultants, evaluation of patient's response to treatment, examination of patient, obtaining history from patient or surrogate, ordering and performing treatments and interventions, ordering and review of laboratory studies, ordering and review of radiographic studies, pulse oximetry and re-evaluation of patient's  condition.  X-ray by radiology myself shows diffuse interstitial disease consistent with multifocal viral illness.  Consistent with Covid, patient's chemistry shows no signs of significant endorgan dysfunction or electrolyte derangement.  Consulted hospitalist for admission for further work-up and follow-up on Covid pending labs.  The patient will be admitted to the hospitalist.  For the remainder this patient's care please see inpatient team notes.  I will intervene as needed while the patient remains in the emergency department.   Final Clinical Impression(s) / ED Diagnoses Final diagnoses:  COVID  Acute hypercapnic respiratory failure [facility](HCC)    Rx / DC Orders ED Discharge Orders    None       Sabino DonovanKatz, Joevon Holliman C, MD 05/21/20 2306

## 2020-05-21 NOTE — ED Triage Notes (Signed)
Pt from home c/o shob. Dx with COVID on Sunday. 70s on RA, 100% on 10LNRB. Denies chest pain. No other physical complaints. A&O x 4

## 2020-05-21 NOTE — ED Notes (Signed)
ED TO INPATIENT HANDOFF REPORT  Name/Age/Gender Yvette Gibbs 29 y.o. female  Code Status    Code Status Orders  (From admission, onward)         Start     Ordered   05/21/20 2125  Full code  Continuous        05/21/20 2124        Code Status History    Date Active Date Inactive Code Status Order ID Comments User Context   03/03/2019 1215 03/04/2019 2050 Full Code [id number]  Teddy Spike, DO Inpatient   09/07/2015 0403 09/10/2015 1521 Full Code [id number]  Lorretta Harp, MD ED   09/01/2015 2218 09/05/2015 1858 Full Code [id number]  Eduard Clos, MD Inpatient   09/04/2014 1854 09/06/2014 1902 Full Code [id number]  Barnetta Chapel, PA-C Inpatient   Advance Care Planning Activity      Home/SNF/Other Home  Chief Complaint Acute respiratory failure due to COVID-19 (HCC) [U07.1, J96.00]  Level of Care/Admitting Diagnosis ED Disposition    ED Disposition Condition Comment   Admit  Hospital Area: [facility] [[id number]]  Level of Care: Telemetry [5]  Admit to tele based on following criteria: Complex arrhythmia (Bradycardia/Tachycardia)  May admit patient to [facility] or [facility] if equivalent level of care is available:: No  Covid Evaluation: Confirmed COVID Positive  Diagnosis: Acute respiratory failure due to COVID-19 [facility]) [[id number]]  Admitting Physician: Rometta Emery [[id number]]  Attending Physician: Rometta Emery [[id number]]  Estimated length of stay: past midnight tomorrow  Certification:: I certify this patient will need inpatient services for at least 2 midnights       Medical History Past Medical History:  Diagnosis Date  . Asthma   . Depression   . Diabetes mellitus type 2, uncontrolled (HCC)   . Extreme obesity   . Family history of adverse reaction to anesthesia    " my grandmother had an allergic reaction  . Hypertension   . Mental disorder   . Obesity   . OSA (obstructive sleep apnea)     Allergies Allergies  Allergen  Reactions  . Lisinopril     Caused a rash and caused her to sweat a lot.  . Fish Allergy Itching and Rash    IV Location/Drains/Wounds Patient Lines/Drains/Airways Status    Active Line/Drains/Airways    Name Placement date Placement time Site Days   Peripheral IV 05/21/20 Left Antecubital 05/21/20  2023  Antecubital  less than 1          Labs/Imaging Results for orders placed or performed during the hospital encounter of 05/21/20 (from the past 48 hour(s))  Lactic acid, plasma     Status: None   Collection Time: 05/21/20  8:27 PM  Result Value Ref Range   Lactic Acid, Venous 1.1 0.5 - 1.9 mmol/L    Comment: Performed at [facility], [address], [place], [place] [address]  CBC WITH DIFFERENTIAL     Status: Abnormal   Collection Time: 05/21/20  8:27 PM  Result Value Ref Range   WBC 8.4 4.0 - 10.5 K/uL   RBC 3.73 (L) 3.87 - 5.11 MIL/uL   Hemoglobin 10.5 (L) 12.0 - 15.0 g/dL   HCT 27.7 (L) 36 - 46 %   MCV 91.4 80.0 - 100.0 fL   MCH 28.2 26.0 - 34.0 pg   MCHC 30.8 30.0 - 36.0 g/dL   RDW 82.4 23.5 - 36.1 %   Platelets 185 150 - 400 K/uL  nRBC 0.0 0.0 - 0.2 %   Neutrophils Relative % 78 %   Neutro Abs 6.6 1.7 - 7.7 K/uL   Lymphocytes Relative 13 %   Lymphs Abs 1.1 0.7 - 4.0 K/uL   Monocytes Relative 8 %   Monocytes Absolute 0.7 0.1 - 1.0 K/uL   Eosinophils Relative 0 %   Eosinophils Absolute 0.0 0.0 - 0.5 K/uL   Basophils Relative 0 %   Basophils Absolute 0.0 0.0 - 0.1 K/uL   Immature Granulocytes 1 %   Abs Immature Granulocytes 0.04 0.00 - 0.07 K/uL    Comment: Performed at [facility], [address] [place], [place] [address]  Comprehensive metabolic panel     Status: Abnormal   Collection Time: 05/21/20  8:27 PM  Result Value Ref Range   Sodium 133 (L) 135 - 145 mmol/L   Potassium 3.5 3.5 - 5.1 mmol/L   Chloride 94 (L) 98 - 111 mmol/L   CO2 30 22 - 32 mmol/L   Glucose, Bld 323 (H) 70 - 99 mg/dL    Comment: Glucose  reference range applies only to samples taken after fasting for at least 8 hours.   BUN 7 6 - 20 mg/dL   Creatinine, Ser 7.06 0.44 - 1.00 mg/dL   Calcium 8.1 (L) 8.9 - 10.3 mg/dL   Total Protein 7.7 6.5 - 8.1 g/dL   Albumin 3.2 (L) 3.5 - 5.0 g/dL   AST 48 (H) 15 - 41 U/L   ALT 48 (H) 0 - 44 U/L   Alkaline Phosphatase 54 38 - 126 U/L   Total Bilirubin 0.7 0.3 - 1.2 mg/dL   GFR, Estimated >23 >76 mL/min    Comment: (NOTE) Calculated using the CKD-EPI Creatinine Equation (2021)    Anion gap 9 5 - 15    Comment: Performed at [facility], [address] [place], [place] [address]  D-dimer, quantitative     Status: None   Collection Time: 05/21/20  8:27 PM  Result Value Ref Range   D-Dimer, Quant 0.47 0.00 - 0.50 ug/mL-FEU    Comment: (NOTE) At the manufacturer cut-off value of 0.5 g/mL FEU, this assay has a negative predictive value of 95-100%.This assay is intended for use in conjunction with a clinical pretest probability (PTP) assessment model to exclude pulmonary embolism (PE) and deep venous thrombosis (DVT) in outpatients suspected of PE or DVT. Results should be correlated with clinical presentation. Performed at [facility], [address], [place], [place] [address]   Procalcitonin     Status: None   Collection Time: 05/21/20  8:27 PM  Result Value Ref Range   Procalcitonin 0.26 ng/mL    Comment:        Interpretation: PCT (Procalcitonin) <= 0.5 ng/mL: Systemic infection (sepsis) is not likely. Local bacterial infection is possible. (NOTE)       Sepsis PCT Algorithm           Lower Respiratory Tract                                      Infection PCT Algorithm    ----------------------------     ----------------------------         PCT < 0.25 ng/mL                PCT < 0.10 ng/mL  Strongly encourage             Strongly discourage   discontinuation of antibiotics    initiation of antibiotics     ----------------------------     -----------------------------       PCT 0.25 - 0.50 ng/mL            PCT 0.10 - 0.25 ng/mL               OR       >80% decrease in PCT            Discourage initiation of                                            antibiotics      Encourage discontinuation           of antibiotics    ----------------------------     -----------------------------         PCT >= 0.50 ng/mL              PCT 0.26 - 0.50 ng/mL               AND        <80% decrease in PCT             Encourage initiation of                                             antibiotics       Encourage continuation           of antibiotics    ----------------------------     -----------------------------        PCT >= 0.50 ng/mL                  PCT > 0.50 ng/mL               AND         increase in PCT                  Strongly encourage                                      initiation of antibiotics    Strongly encourage escalation           of antibiotics                                     -----------------------------                                           PCT <= 0.25 ng/mL                                                 OR                                        >  80% decrease in PCT                                      Discontinue / Do not initiate                                             antibiotics  Performed at [facility], [address], [place], [place] [address]   Lactate dehydrogenase     Status: Abnormal   Collection Time: 05/21/20  8:27 PM  Result Value Ref Range   LDH 420 (H) 98 - 192 U/L    Comment: Performed at [facility], [address] [place], [place] [address]  Ferritin     Status: None   Collection Time: 05/21/20  8:27 PM  Result Value Ref Range   Ferritin 225 11 - 307 ng/mL    Comment: Performed at [facility], [address], [place], [place] [address]  Triglycerides     Status: None   Collection  Time: 05/21/20  8:27 PM  Result Value Ref Range   Triglycerides 125 <150 mg/dL    Comment: Performed at [facility], [address] [place], [place] [address]  Fibrinogen     Status: Abnormal   Collection Time: 05/21/20  8:27 PM  Result Value Ref Range   Fibrinogen 723 (H) 210 - 475 mg/dL    Comment: Performed at [facility], [address] [place], [place] [address]  C-reactive protein     Status: Abnormal   Collection Time: 05/21/20  8:27 PM  Result Value Ref Range   CRP 17.8 (H) <1.0 mg/dL    Comment: Performed at [facility], [address] [place], [place] [address]  I-Stat beta hCG blood, ED     Status: None   Collection Time: 05/21/20  8:46 PM  Result Value Ref Range   I-stat hCG, quantitative <5.0 <5 mIU/mL   Comment 3            Comment:   GEST. AGE      CONC.  (mIU/mL)   <=1 WEEK        5 - 50     2 WEEKS       50 - 500     3 WEEKS       100 - 10,000     4 WEEKS     1,000 - 30,000        FEMALE AND NON-PREGNANT FEMALE:     LESS THAN 5 mIU/mL    DG Chest Port 1 View  Result Date: 05/21/2020 CLINICAL DATA:  COVID diagnosis EXAM: PORTABLE CHEST 1 VIEW COMPARISON:  May 19, 2020 FINDINGS: The lung volumes are low. There are diffuse hazy bilateral airspace opacities that are new from prior study. Heart size is enlarged. There is no pneumothorax. No large pleural effusion. Evaluation is limited by patient body habitus. IMPRESSION: New diffuse hazy bilateral airspace opacities consistent with multifocal pneumonia (viral or bacterial). Electronically Signed   By: Katherine Mantle M.D.   On: 05/21/2020 20:12    Pending Labs Unresulted Labs (From admission, onward)          Start     Ordered   05/28/20 0500  Creatinine, serum  (  enoxaparin (LOVENOX)    CrCl >/= 30 ml/min)  Weekly,   R     Comments: while on enoxaparin therapy    05/21/20 2124   05/22/20 0500  CBC with Differential/Platelet  Daily,   R       05/21/20 2124   05/22/20 0500  Comprehensive metabolic panel  Daily,   R      05/21/20 2124   05/22/20 0500  C-reactive protein  Daily,   R      05/21/20 2124   05/22/20 0500  D-dimer, quantitative (not at [facility])  Daily,   R      05/21/20 2124   05/22/20 0500  Ferritin  Daily,   R      05/21/20 2124   05/22/20 0500  Magnesium  Daily,   R      05/21/20 2124   05/22/20 0500  Phosphorus  Daily,   R      05/21/20 2124   05/21/20 2125  HIV Antibody (routine testing w rflx)  (HIV Antibody (Routine testing w reflex) panel)  Once,   STAT        05/21/20 2124   05/21/20 1939  Blood Culture (routine x 2)  BLOOD CULTURE X 2,   STAT      05/21/20 1941          Vitals/Pain Today's Vitals   05/21/20 1930 05/21/20 2000 05/21/20 2030 05/21/20 2100  BP: (!) 143/96 (!) 143/83 (!) 157/102 (!) 166/96  Pulse: (!) 105 (!) 101 (!) 109 71  Resp: (!) 34 (!) 22 (!) 36 (!) 33  Temp:      TempSrc:      SpO2: 99% 99% 96% 99%  Weight:      Height:      PainSc:        Isolation Precautions Airborne and Contact precautions  Medications Medications  remdesivir 200 mg in sodium chloride 0.9% 250 mL IVPB (0 mg Intravenous Stopped 05/21/20 2115)    Followed by  remdesivir 100 mg in sodium chloride 0.9 % 100 mL IVPB (has no administration in time range)  enoxaparin (LOVENOX) injection 70 mg (has no administration in time range)  Ipratropium-Albuterol (COMBIVENT) respimat 1 puff (has no administration in time range)  dexamethasone (DECADRON) injection 6 mg ( Intravenous Canceled Entry 05/21/20 2202)  guaiFENesin-dextromethorphan (ROBITUSSIN DM) 100-10 MG/5ML syrup 10 mL (has no administration in time range)  chlorpheniramine-HYDROcodone (TUSSIONEX) 10-8 MG/5ML suspension 5 mL (has no administration in time range)  insulin detemir (LEVEMIR) injection 21 Units (has no administration in time range)  insulin aspart (novoLOG) injection 0-20 Units (has no administration in time range)  insulin aspart (novoLOG)  injection 0-5 Units (has no administration in time range)  acetaminophen (TYLENOL) tablet 650 mg (has no administration in time range)  ondansetron (ZOFRAN) tablet 4 mg (has no administration in time range)    Or  ondansetron (ZOFRAN) injection 4 mg (has no administration in time range)  azithromycin (ZITHROMAX) 500 mg in sodium chloride 0.9 % 250 mL IVPB (has no administration in time range)  linagliptin (TRADJENTA) tablet 5 mg (has no administration in time range)  baricitinib (OLUMIANT) tablet 4 mg (has no administration in time range)  dexamethasone (DECADRON) injection 10 mg (10 mg Intravenous Given 05/21/20 2028)  acetaminophen (TYLENOL) tablet 650 mg (650 mg Oral Given 05/21/20 2114)    Mobility walks

## 2020-05-21 NOTE — Progress Notes (Signed)
CRITICAL VALUE ALERT  Critical Value:  CBG 419  Date & Time Notied:  05/21/20;2333  Provider Notified: Yes  Orders Received/Actions taken:Awaiting new orders

## 2020-05-21 NOTE — H&P (Signed)
.  History and Physical   Yvette Gibbs NTI:[id number] DOB: 1990-12-03 DOA: 05/21/2020  Referring MD/NP/PA: Dr Dewaine Conger  PCP: Ladell Pier, MD   Outpatient Specialists: None   Patient coming from: Home  Chief Complaint: Shortness of breath  HPI: Yvette Gibbs is a 29 y.o. female with medical history significant of diabetes, hypertension, depression, morbid obesity, obstructive sleep apnea and asthma who was diagnosed with COVID-19 2 days ago.  She was seen in the ER and treated.  Sent home on empiric treatment.  Patient came back today with oxygen sats in the 70s on room air.  She is currently on 100% 10 L nonrebreather back.  She is significantly diminished.  Symptoms started about 2 weeks ago per patient.  She came to the ER where she was treated and found to be okay on room air.  Patient was not vaccinated. She was 92% on room air at the time.  Today however symptoms have gotten worse.  She is now requiring oxygen and will be admitted to the hospital for further evaluation and treatment..  ED Course: Temperature 102.1 blood pressure 166/96 pulse 109, respiratory 39 oxygen sats 70% on room air.  White count 8.4 hemoglobin 10.5 platelets 185.  Sodium 133 potassium 3.5 chloride 94 CO2 30 BUN is 7 creatinine 0.75 calcium 8.1.  Glucose 323 lactic acid 1.1.  Triglyceride 125 LDH 420 procalcitonin 0.26.  Chest x-ray showed new diffuse hazy bilateral airspace opacities consistent with multifocal pneumonia.  Patient being admitted with COVID-19 infection now with hypoxemia  Review of Systems: As per HPI otherwise 10 point review of systems negative.    Past Medical History:  Diagnosis Date  . Asthma   . Depression   . Diabetes mellitus type 2, uncontrolled ([place])   . Extreme obesity   . Family history of adverse reaction to anesthesia    " my grandmother had an allergic reaction  . Hypertension   . Mental disorder   . Obesity   . OSA (obstructive sleep apnea)     Past Surgical  History:  Procedure Laterality Date  . APPENDECTOMY    . CHOLECYSTECTOMY N/A 09/05/2014   Procedure: LAPAROSCOPIC CHOLECYSTECTOMY;  Surgeon: Ralene Ok, MD;  Location: [facility];  Service: General;  Laterality: N/A;  . TONSILLECTOMY       reports that she has never smoked. She has never used smokeless tobacco. She reports that she does not drink alcohol and does not use drugs.  Allergies  Allergen Reactions  . Lisinopril     Caused a rash and caused her to sweat a lot.  . Fish Allergy Itching and Rash    Family History  Problem Relation Age of Onset  . Diabetes Mellitus II Maternal Grandmother      Prior to Admission medications   Medication Sig Start Date End Date Taking? Authorizing Provider  acetaminophen (TYLENOL) 500 MG tablet Take 1,000 mg by mouth every 6 (six) hours as needed for mild pain or fever.    [provider]  albuterol (VENTOLIN HFA) 108 (90 Base) MCG/ACT inhaler Inhale 1-2 puffs into the lungs every 6 (six) hours as needed for wheezing or shortness of breath. 03/21/19   Ladell Pier, MD  guaiFENesin-dextromethorphan (ROBITUSSIN DM) 100-10 MG/5ML syrup Take 5 mLs by mouth every 4 (four) hours as needed for cough. Patient not taking: Reported on 05/19/2020 03/04/19   Dhungel, Flonnie Overman, MD  metFORMIN (GLUCOPHAGE) 500 MG tablet Take 1 tablet (500 mg total) by mouth 2 (two)  times daily with a meal. Patient not taking: Reported on 02/26/2020 06/19/19   Ladell Pier, MD  omeprazole (PRILOSEC) 20 MG capsule Take 1 capsule (20 mg total) by mouth daily. Patient not taking: Reported on 02/26/2020 06/19/19   Ladell Pier, MD    Physical Exam: Vitals:   05/21/20 1930 05/21/20 2000 05/21/20 2030 05/21/20 2100  BP: (!) 143/96 (!) 143/83 (!) 157/102 (!) 166/96  Pulse: (!) 105 (!) 101 (!) 109 71  Resp: (!) 34 (!) 22 (!) 36 (!) 33  Temp:      TempSrc:      SpO2: 99% 99% 96% 99%  Weight:      Height:          Constitutional: Morbidly obese, in acute  respiratory distress Vitals:   05/21/20 1930 05/21/20 2000 05/21/20 2030 05/21/20 2100  BP: (!) 143/96 (!) 143/83 (!) 157/102 (!) 166/96  Pulse: (!) 105 (!) 101 (!) 109 71  Resp: (!) 34 (!) 22 (!) 36 (!) 33  Temp:      TempSrc:      SpO2: 99% 99% 96% 99%  Weight:      Height:       Eyes: PERRL, lids and conjunctivae normal ENMT: Mucous membranes are dry. Posterior pharynx clear of any exudate or lesions.Normal dentition.  Neck: normal, supple, no masses, no thyromegaly Respiratory: Decreased air entry bilaterally, coarse breath sounds, diffuse rhonchi, no wheeze, diffuse rales, no accessory muscle use.  Cardiovascular: Sinus tachycardia and tachypnea, no murmurs / rubs / gallops. No extremity edema. 2+ pedal pulses. No carotid bruits.  Abdomen: no tenderness, no masses palpated. No hepatosplenomegaly. Bowel sounds positive.  Musculoskeletal: no clubbing / cyanosis. No joint deformity upper and lower extremities. Good ROM, no contractures. Normal muscle tone.  Skin: no rashes, lesions, ulcers. No induration Neurologic: CN 2-12 grossly intact. Sensation intact, DTR normal. Strength 5/5 in all 4.  Psychiatric: Normal judgment and insight. Alert and oriented x 3. Normal mood.     Labs on Admission: I have personally reviewed following labs and imaging studies  CBC: Recent Labs  Lab 05/19/20 1121 05/21/20 2027  WBC 4.8 8.4  NEUTROABS 3.1 6.6  HGB 10.5* 10.5*  HCT 34.9* 34.1*  MCV 93.1 91.4  PLT 189 003   Basic Metabolic Panel: Recent Labs  Lab 05/19/20 1121 05/21/20 2027  NA 133* 133*  K 3.8 3.5  CL 96* 94*  CO2 29 30  GLUCOSE 296* 323*  BUN 8 7  CREATININE 0.70 0.75  CALCIUM 8.4* 8.1*   GFR: Estimated Creatinine Clearance: 143.9 mL/min (by C-G formula based on SCr of 0.75 mg/dL). Liver Function Tests: Recent Labs  Lab 05/19/20 1121 05/21/20 2027  AST 132* 48*  ALT 96* 48*  ALKPHOS 56 54  BILITOT 0.4 0.7  PROT 7.6 7.7  ALBUMIN 3.5 3.2*   No results for  input(s): LIPASE, AMYLASE in the last 168 hours. No results for input(s): AMMONIA in the last 168 hours. Coagulation Profile: No results for input(s): INR, PROTIME in the last 168 hours. Cardiac Enzymes: No results for input(s): CKTOTAL, CKMB, CKMBINDEX, TROPONINI in the last 168 hours. BNP (last 3 results) No results for input(s): PROBNP in the last 8760 hours. HbA1C: No results for input(s): HGBA1C in the last 72 hours. CBG: No results for input(s): GLUCAP in the last 168 hours. Lipid Profile: Recent Labs    05/21/20 2027  TRIG 125   Thyroid Function Tests: No results for input(s): TSH, T4TOTAL, FREET4, T3FREE,  THYROIDAB in the last 72 hours. Anemia Panel: No results for input(s): VITAMINB12, FOLATE, FERRITIN, TIBC, IRON, RETICCTPCT in the last 72 hours. Urine analysis:    Component Value Date/Time   COLORURINE YELLOW 02/26/2020 1150   APPEARANCEUR CLEAR 02/26/2020 1150   LABSPEC 1.039 (H) 02/26/2020 1150   PHURINE 5.0 02/26/2020 1150   GLUCOSEU NEGATIVE 02/26/2020 1150   HGBUR NEGATIVE 02/26/2020 1150   BILIRUBINUR NEGATIVE 02/26/2020 1150   KETONESUR NEGATIVE 02/26/2020 1150   PROTEINUR 100 (A) 02/26/2020 1150   UROBILINOGEN 0.2 09/04/2014 1001   NITRITE NEGATIVE 02/26/2020 [place] 02/26/2020 1150   Sepsis Labs: @LABRCNTIP (procalcitonin:4,lacticidven:4) ) Recent Results (from the past 240 hour(s))  Resp Panel by RT-PCR (Flu A&B, Covid) Nasopharyngeal Swab     Status: Abnormal   Collection Time: 05/19/20  2:56 PM   Specimen: Nasopharyngeal Swab; Nasopharyngeal(NP) swabs in vial transport medium  Result Value Ref Range Status   SARS Coronavirus 2 by RT PCR POSITIVE (A) NEGATIVE Final    Comment: RESULT CALLED TO, READ BACK BY AND VERIFIED WITH: WALISIEWIZZ,K RN @1726  ON 05/19/20 JACKSON,K (NOTE) SARS-CoV-2 target nucleic acids are DETECTED.  The SARS-CoV-2 RNA is generally detectable in upper respiratory specimens during the acute phase of  infection. Positive results are indicative of the presence of the identified virus, but do not rule out bacterial infection or co-infection with other pathogens not detected by the test. Clinical correlation with patient history and other diagnostic information is necessary to determine patient infection status. The expected result is Negative.  Fact Sheet for Patients: EntrepreneurPulse.com.au  Fact Sheet for Healthcare Providers: IncredibleEmployment.be  This test is not yet approved or cleared by the [place] FDA and  has been authorized for detection and/or diagnosis of SARS-CoV-2 by FDA under an Emergency Use Authorization (EUA).  This EUA will remain in effect (meaning this t est can be used) for the duration of  the COVID-19 declaration under Section 564(b)(1) of the Act, 21 U.S.C. section 360bbb-3(b)(1), unless the authorization is terminated or revoked sooner.     Influenza A by PCR NEGATIVE NEGATIVE Final   Influenza B by PCR NEGATIVE NEGATIVE Final    Comment: (NOTE) The Xpert Xpress SARS-CoV-2/FLU/RSV plus assay is intended as an aid in the diagnosis of influenza from Nasopharyngeal swab specimens and should not be used as a sole basis for treatment. Nasal washings and aspirates are unacceptable for Xpert Xpress SARS-CoV-2/FLU/RSV testing.  Fact Sheet for Patients: EntrepreneurPulse.com.au  Fact Sheet for Healthcare Providers: IncredibleEmployment.be  This test is not yet approved or cleared by the [place] FDA and has been authorized for detection and/or diagnosis of SARS-CoV-2 by FDA under an Emergency Use Authorization (EUA). This EUA will remain in effect (meaning this test can be used) for the duration of the COVID-19 declaration under Section 564(b)(1) of the Act, 21 U.S.C. section 360bbb-3(b)(1), unless the authorization is terminated or revoked.  Performed at [facility], [place] [address] [place], [place] [address]      Radiological Exams on Admission: DG Chest Port 1 View  Result Date: 05/21/2020 CLINICAL DATA:  COVID diagnosis EXAM: PORTABLE CHEST 1 VIEW COMPARISON:  May 19, 2020 FINDINGS: The lung volumes are low. There are diffuse hazy bilateral airspace opacities that are new from prior study. Heart size is enlarged. There is no pneumothorax. No large pleural effusion. Evaluation is limited by patient body habitus. IMPRESSION: New diffuse hazy bilateral airspace opacities consistent with multifocal pneumonia (viral or bacterial). Electronically Signed  By: Constance Holster M.D.   On: 05/21/2020 20:12    EKG: Independently reviewed.  Sinus tachycardia  Assessment/Plan Principal Problem:   Acute respiratory failure due to COVID-19 [facility]) Active Problems:   Morbid obesity (H[facility])   OSA (obstructive sleep apnea)   Severe sepsis ([facility])   HTN (hypertension)   Hyperglycemia   Type 2 diabetes mellitus without complication, without long-term current use of insulin ([facility])   Gastroesophageal reflux disease without esophagitis     #1 acute respiratory failure with hypoxemia: Secondary to COVID-19 pneumonia.  Per patient symptoms started almost 2 weeks ago.  She was diagnosed only 2 days ago.  She is also requiring high flow oxygen.  We will admit initiate treatment for secondary bacterial pneumonia in addition to Covid treatment.  She definitely will need steroids.  Evaluate.  Admit to telemetry.  She is borderline for stepdown admission.  #2 COVID-19 pneumonia: Patient will be placed on dexamethasone.  Olumiant,.  Remdesivir is the at this point is questionable with hypoxemia.  We will initiate out defer to rounding team.  Treat for secondary bacterial pneumonia  #3 severe sepsis: Patient has met sepsis criteria due to COVID-19 pneumonia with endorgan damage with hypoxemia.  Continue treatment as above.  We will need to keep dry due to  COVID-19.  #4 diabetes: Sliding scale insulin with diabetic management  #5 morbid obesity: Dietary counseling  #6 GERD: Continue with PPIs  #7 obstructive sleep apnea: CPAP at night  #8 essential hypertension: Blood pressure markedly elevated.  Initial home regimen and adjust.   DVT prophylaxis: Lovenox Code Status: Full code Family Communication: No family at bedside Disposition Plan: Home Consults called: None Admission status: Inpatient  Severity of Illness: The appropriate patient status for this patient is INPATIENT. Inpatient status is judged to be reasonable and necessary in order to provide the required intensity of service to ensure the patient's safety. The patient's presenting symptoms, physical exam findings, and initial radiographic and laboratory data in the context of their chronic comorbidities is felt to place them at high risk for further clinical deterioration. Furthermore, it is not anticipated that the patient will be medically stable for discharge from the hospital within 2 midnights of admission. The following factors support the patient status of inpatient.   " The patient's presenting symptoms include shortness of breath and cough. " The worrisome physical exam findings include decreased air entry with marked expiratory wheezing. " The initial radiographic and laboratory data are worrisome because of diffuse infiltrates and evidence of sepsis. " The chronic co-morbidities include diabetes with hypertension.   * I certify that at the point of admission it is my clinical judgment that the patient will require inpatient hospital care spanning beyond 2 midnights from the point of admission due to high intensity of service, high risk for further deterioration and high frequency of surveillance required.Barbette Merino MD [facility] Pager [phone]  If 7PM-7AM, please contact night-coverage [email or web address] Password [email or web address]  05/21/2020, 9:22 PM

## 2020-05-22 ENCOUNTER — Other Ambulatory Visit: Payer: Self-pay

## 2020-05-22 DIAGNOSIS — U071 COVID-19: Principal | ICD-10-CM

## 2020-05-22 DIAGNOSIS — I10 Essential (primary) hypertension: Secondary | ICD-10-CM | POA: Diagnosis not present

## 2020-05-22 DIAGNOSIS — E119 Type 2 diabetes mellitus without complications: Secondary | ICD-10-CM | POA: Diagnosis not present

## 2020-05-22 DIAGNOSIS — J9602 Acute respiratory failure with hypercapnia: Secondary | ICD-10-CM | POA: Diagnosis not present

## 2020-05-22 DIAGNOSIS — K219 Gastro-esophageal reflux disease without esophagitis: Secondary | ICD-10-CM | POA: Diagnosis not present

## 2020-05-22 DIAGNOSIS — J9600 Acute respiratory failure, unspecified whether with hypoxia or hypercapnia: Secondary | ICD-10-CM

## 2020-05-22 LAB — CBC WITH DIFFERENTIAL/PLATELET
Abs Immature Granulocytes: 0.04 10*3/uL (ref 0.00–0.07)
Basophils Absolute: 0 10*3/uL (ref 0.0–0.1)
Basophils Relative: 0 %
Eosinophils Absolute: 0 10*3/uL (ref 0.0–0.5)
Eosinophils Relative: 0 %
HCT: 35.2 % — ABNORMAL LOW (ref 36.0–46.0)
Hemoglobin: 10.4 g/dL — ABNORMAL LOW (ref 12.0–15.0)
Immature Granulocytes: 1 %
Lymphocytes Relative: 12 %
Lymphs Abs: 0.9 10*3/uL (ref 0.7–4.0)
MCH: 28 pg (ref 26.0–34.0)
MCHC: 29.5 g/dL — ABNORMAL LOW (ref 30.0–36.0)
MCV: 94.9 fL (ref 80.0–100.0)
Monocytes Absolute: 0.4 10*3/uL (ref 0.1–1.0)
Monocytes Relative: 5 %
Neutro Abs: 6.5 10*3/uL (ref 1.7–7.7)
Neutrophils Relative %: 82 %
Platelets: 192 10*3/uL (ref 150–400)
RBC: 3.71 MIL/uL — ABNORMAL LOW (ref 3.87–5.11)
RDW: 14.6 % (ref 11.5–15.5)
WBC: 7.8 10*3/uL (ref 4.0–10.5)
nRBC: 0 % (ref 0.0–0.2)

## 2020-05-22 LAB — COMPREHENSIVE METABOLIC PANEL
ALT: 45 U/L — ABNORMAL HIGH (ref 0–44)
AST: 42 U/L — ABNORMAL HIGH (ref 15–41)
Albumin: 3.3 g/dL — ABNORMAL LOW (ref 3.5–5.0)
Alkaline Phosphatase: 54 U/L (ref 38–126)
Anion gap: 10 (ref 5–15)
BUN: 10 mg/dL (ref 6–20)
CO2: 29 mmol/L (ref 22–32)
Calcium: 8 mg/dL — ABNORMAL LOW (ref 8.9–10.3)
Chloride: 96 mmol/L — ABNORMAL LOW (ref 98–111)
Creatinine, Ser: 0.84 mg/dL (ref 0.44–1.00)
GFR, Estimated: >60 mL/min (ref >60)
Glucose, Bld: 346 mg/dL — ABNORMAL HIGH (ref 70–99)
Potassium: 4.7 mmol/L (ref 3.5–5.1)
Sodium: 135 mmol/L (ref 135–145)
Total Bilirubin: 0.5 mg/dL (ref 0.3–1.2)
Total Protein: 8.1 g/dL (ref 6.5–8.1)

## 2020-05-22 LAB — GLUCOSE, CAPILLARY
Glucose-Capillary: 378 mg/dL — ABNORMAL HIGH (ref 70–99)
Glucose-Capillary: 360 mg/dL — ABNORMAL HIGH (ref 70–99)
Glucose-Capillary: 325 mg/dL — ABNORMAL HIGH (ref 70–99)
Glucose-Capillary: 317 mg/dL — ABNORMAL HIGH (ref 70–99)

## 2020-05-22 LAB — HIV ANTIBODY (ROUTINE TESTING W REFLEX): HIV Screen 4th Generation wRfx: NONREACTIVE

## 2020-05-22 LAB — D-DIMER, QUANTITATIVE: D-Dimer, Quant: 0.47 ug/mL-FEU (ref 0.00–0.50)

## 2020-05-22 LAB — C-REACTIVE PROTEIN: CRP: 21.2 mg/dL — ABNORMAL HIGH (ref <1.0)

## 2020-05-22 LAB — PHOSPHORUS: Phosphorus: 2.4 mg/dL — ABNORMAL LOW (ref 2.5–4.6)

## 2020-05-22 LAB — FERRITIN: Ferritin: 236 ng/mL (ref 11–307)

## 2020-05-22 LAB — MAGNESIUM: Magnesium: 2.1 mg/dL (ref 1.7–2.4)

## 2020-05-22 MED ORDER — HYDROCOD POLST-CPM POLST ER 10-8 MG/5ML PO SUER: 5.0000 mL | Freq: Two times a day (BID) | ORAL | Status: DC | PRN

## 2020-05-22 MED ORDER — LIVING WELL WITH DIABETES BOOK
Freq: Once | Status: AC
  Filled 2020-05-22 (×2): qty 1

## 2020-05-22 MED ORDER — INSULIN DETEMIR 100 UNIT/ML ~~LOC~~ SOLN
25.0000 [IU] | Freq: Two times a day (BID) | SUBCUTANEOUS | Status: DC
  Administered 2020-05-22 – 2020-05-23 (×2): 25 [IU] via SUBCUTANEOUS
  Filled 2020-05-22 (×2): qty 0.25

## 2020-05-22 MED ORDER — AMLODIPINE BESYLATE 5 MG PO TABS
5.0000 mg | ORAL_TABLET | Freq: Every day | ORAL | Status: DC
  Administered 2020-05-22 – 2020-05-25 (×4): 5 mg via ORAL
  Filled 2020-05-22 (×4): qty 1

## 2020-05-22 MED ORDER — AZITHROMYCIN 250 MG PO TABS: 500.0000 mg | ORAL_TABLET | Freq: Every day | ORAL | Status: DC

## 2020-05-22 MED ORDER — ENSURE MAX PROTEIN PO LIQD
11.0000 [oz_av] | Freq: Two times a day (BID) | ORAL | Status: DC
  Administered 2020-05-23 – 2020-05-24 (×2): 11 [oz_av] via ORAL
  Filled 2020-05-22 (×5): qty 330

## 2020-05-22 MED ORDER — ADULT MULTIVITAMIN W/MINERALS CH
1.0000 | ORAL_TABLET | Freq: Every day | ORAL | Status: DC
  Administered 2020-05-23 – 2020-05-25 (×3): 1 via ORAL
  Filled 2020-05-22 (×3): qty 1

## 2020-05-22 MED ORDER — ENOXAPARIN SODIUM 80 MG/0.8ML ~~LOC~~ SOLN
80.0000 mg | SUBCUTANEOUS | Status: DC
  Administered 2020-05-22 – 2020-05-24 (×3): 80 mg via SUBCUTANEOUS
  Filled 2020-05-22 (×3): qty 0.8

## 2020-05-22 MED ORDER — METHYLPREDNISOLONE SODIUM SUCC 125 MG IJ SOLR
80.0000 mg | Freq: Two times a day (BID) | INTRAMUSCULAR | Status: DC
  Administered 2020-05-22 – 2020-05-24 (×4): 80 mg via INTRAVENOUS
  Filled 2020-05-22 (×5): qty 2

## 2020-05-22 MED ORDER — PROSOURCE PLUS PO LIQD
30.0000 mL | Freq: Three times a day (TID) | ORAL | Status: DC
  Administered 2020-05-22 – 2020-05-24 (×3): 30 mL via ORAL
  Filled 2020-05-22 (×5): qty 30

## 2020-05-22 MED ORDER — INSULIN ASPART 100 UNIT/ML ~~LOC~~ SOLN
4.0000 [IU] | Freq: Three times a day (TID) | SUBCUTANEOUS | Status: DC
  Administered 2020-05-22 – 2020-05-23 (×2): 4 [IU] via SUBCUTANEOUS

## 2020-05-22 NOTE — Progress Notes (Signed)
Inpatient Diabetes Program Recommendations  AACE/ADA: New Consensus Statement on Inpatient Glycemic Control (2015)  Target Ranges:  Prepandial:   less than 140 mg/dL      Peak postprandial:   less than 180 mg/dL (1-2 hours)      Critically ill patients:  140 - 180 mg/dL   Lab Results  Component Value Date   GLUCAP 419 (H) 05/21/2020   HGBA1C 7.8 (H) 03/03/2019    Review of Glycemic Control Results for Yvette Gibbs, Yvette Gibbs (MRN [id number]) as of 05/22/2020 09:29  Ref. Range 05/21/2020 23:28  Glucose-Capillary Latest Ref Range: 70 - 99 mg/dL 211 (H)   Diabetes history: DM2 Outpatient Diabetes medications: Metformin (reports not taking) Current orders for Inpatient glycemic control: Levemir 21 units bid + Novolog 0-20 units tid + hs 0-5 units + Tradjenta 5 mg + Decadron 6 mg qd  Inpatient Diabetes Program Recommendations:   Consider -A1c to determine prehospital glycemic control Will continue to follow during hospitalization.  Thank you, Billy Fischer. Huntley Demedeiros, RN, MSN, CDE  Diabetes Coordinator Inpatient Glycemic Control Team Team Pager [phone] (8am-5pm) 05/22/2020 9:36 AM

## 2020-05-22 NOTE — TOC Progression Note (Signed)
Transition of Care [facility]) - Progression Note    Patient Details  Name: Carmelle Bamberg MRN: [id number] Date of Birth: 1990/12/25  Transition of Care [facility]) CM/SW Contact  Geni Bers, RN Phone Number: 05/22/2020, 3:40 PM  Clinical Narrative:    Pt is from home with Grandmother. TOC will continue to follow for discharge needs.   Expected Discharge Plan: Home/Self Care Barriers to Discharge: No Barriers Identified  Expected Discharge Plan and Services Expected Discharge Plan: Home/Self Care       Living arrangements for the past 2 months: Single Family Home                                       Social Determinants of Health (SDOH) Interventions    Readmission Risk Interventions No flowsheet data found.

## 2020-05-22 NOTE — Progress Notes (Signed)
   05/22/20 0430  Assess: MEWS Score  Temp 98.7 F (37.1 C)  BP (!) 158/104  Pulse Rate (!) 105  Resp (!) 22  SpO2 93 %  O2 Device Nasal Cannula  O2 Flow Rate (L/min) 5 L/min  Assess: MEWS Score  MEWS Temp 0  MEWS Systolic 0  MEWS Pulse 1  MEWS RR 1  MEWS LOC 0  MEWS Score 2  MEWS Score Color Yellow  Assess: if the MEWS score is Yellow or Red  Were vital signs taken at a resting state? Yes  Focused Assessment No change from prior assessment  Early Detection of Sepsis Score *See Row Information* Low  MEWS guidelines implemented *See Row Information* No, previously red, continue vital signs every 4 hours  Treat  MEWS Interventions Administered scheduled meds/treatments  Pain Scale 0-10  Pain Score 0  Notify: Charge Nurse/RN  Name of Charge Nurse/RN Notified Eula Fried.  Date Charge Nurse/RN Notified 05/22/20  Time Charge Nurse/RN Notified [phone]

## 2020-05-22 NOTE — Progress Notes (Signed)
Initial Nutrition Assessment  RD working remotely.  DOCUMENTATION CODES:   Morbid obesity  INTERVENTION:  - will order Ensure Max BID, each supplement provides 150 kcal and 30 grams protein. - will order 30 ml Prosource Plus TID, each supplement provides 100 kcal and 15 grams protein.  - will order 1 tablet multivitamin with minerals/day.    NUTRITION DIAGNOSIS:   Increased nutrient needs related to acute illness, catabolic illness (COVID-19 infection) as evidenced by estimated needs  GOAL:   Patient will meet greater than or equal to 90% of their needs  MONITOR:   PO intake, Supplement acceptance, Labs, Weight trends  REASON FOR ASSESSMENT:   Malnutrition Screening Tool  ASSESSMENT:   29 y.o. female with medical history of DM, HTN, depression, morbid obesity, OSA, and asthma. She was diagnosed with COVID-19 on 12/5. She presented to the ED on 12/7 due to O2 sats in the 70s on room air. Patient has not been vaccinated against COVID.  No intakes documented since diet advanced from NPO to Heart Healthy/Carb Modified last night at 2125.   Weight yesterday documented as both 309.97 lb and 385 lb; weight of 385 lb appears to be a stated weight. Weight of 309.97 lb is more consistent with other weights that have been documented in the chart.   No information documented in the edema section of flow sheet.   Per notes: - acute respiratory failure with hypoxia--requiring HFNC - COVID-19 PNA - severe sepsis   Labs reviewed; CBG on 12/7: 419 mg/dl, Cl: 96 mmol/l, Ca: 8 mg/dl, Phos: 2.4 mg/dl, LFTs slightly elevated and down from 12/7.  Medications reviewed; sliding scale novolog, 6 units novolog x1 dose 12/8, 21 units levemir BID, 5 mg tradjenta/day, 200 mg IV remdesivir x1 dose 12/7, 100 mg IV remdesivir x1 dose/day x4 days (12/8-12/11)    NUTRITION - FOCUSED PHYSICAL EXAM:  unable to complete at this time.   Diet Order:   Diet Order            Diet heart healthy/carb  modified Room service appropriate? Yes with Assist; Fluid consistency: Thin  Diet effective now                 EDUCATION NEEDS:   No education needs have been identified at this time  Skin:  Skin Assessment: Reviewed RN Assessment  Last BM:  PTA/unknown  Height:   Ht Readings from Last 1 Encounters:  05/21/20 5\' 2"  (1.575 m)    Weight:   Wt Readings from Last 1 Encounters:  05/21/20 (!) 174.6 kg     Estimated Nutritional Needs:  Kcal:  1900-2100 kcal Protein:  110-125 grams Fluid:  >/= 2.1 L/day      14/07/21, MS, RD, LDN, CNSC Inpatient Clinical Dietitian RD pager # available in [facility]  After hours/weekend pager # available in [place]

## 2020-05-22 NOTE — Progress Notes (Signed)
PROGRESS NOTE  Yvette Gibbs ENI:[id number] DOB: [birth year] DOA: 05/21/2020 PCP: Marcine Matar, MD   LOS: 1 day   Brief Narrative / Interim history: 29 year old female with history of diabetes, HTN, depression, super morbid obesity, OSA, asthma who was diagnosed with COVID-19 couple of days ago, seen in the ER and at that time she was satting well on room air and was sent home.  Her symptoms progressed, became more short of breath and came back to the hospital eventually being admitted on 12/7.  She was found to be hypoxic.  Subjective / 24h Interval events: States that she is feeling short of breath every time she tries to have any movement/activity.  Complains of chest pain with deep breathing as well as generalized muscle aches  Assessment & Plan:  Principal Problem Acute Hypoxic Respiratory Failure due to Covid-19 Viral Illness, sepsis due to viremia with fever of 102 on admission -Requiring high flow oxygen, started on remdesivir, steroids, baricitinib, continue.  Procalcitonin was low, lactic acid was normal and there is no need for systemic antibiotics.  Discontinue azithromycin. -Incentive spirometry, flutter valve, proning as able -She is morbidly obese covering her very high risk for poor outcome   COVID-19 Labs  Recent Labs    05/21/20 2027 05/22/20 0544  DDIMER 0.47 0.47  FERRITIN 225 236  LDH 420*  --   CRP 17.8* 21.2*    Lab Results  Component Value Date   SARSCOV2NAA POSITIVE (A) 05/19/2020   SARSCOV2NAA NEGATIVE 02/26/2020   SARSCOV2NAA NEGATIVE 03/03/2019   Active Problems Morbid obesity -Patient will benefit from weight loss  OSA -Nightly CPAP  Type 2 diabetes mellitus -Continue Levemir, sliding scale.  Increase Levemir today and add mealtime NovoLog  CBG (last 3)  Recent Labs    05/21/20 2328 05/22/20 1003 05/22/20 1234  GLUCAP 419* 378* 360*    Scheduled Meds: . (feeding supplement) PROSource Plus  30 mL Oral TID BM  . baricitinib   4 mg Oral Daily  . dexamethasone (DECADRON) injection  6 mg Intravenous Q24H  . enoxaparin (LOVENOX) injection  80 mg Subcutaneous Q24H  . insulin aspart  0-20 Units Subcutaneous TID WC  . insulin aspart  0-5 Units Subcutaneous QHS  . insulin detemir  0.15 Units/kg Subcutaneous BID  . Ipratropium-Albuterol  1 puff Inhalation Q6H  . linagliptin  5 mg Oral Daily  . living well with diabetes book   Does not apply Once  . multivitamin with minerals  1 tablet Oral Daily  . Ensure Max Protein  11 oz Oral BID   Continuous Infusions: . remdesivir 100 mg in NS 100 mL 100 mg (05/22/20 1034)   PRN Meds:.acetaminophen, chlorpheniramine-HYDROcodone, guaiFENesin-dextromethorphan, ondansetron **OR** ondansetron (ZOFRAN) IV  DVT prophylaxis: Lovenox Code Status: Full code Family Communication: called grandmother   Status is: Inpatient  Remains inpatient appropriate because:Inpatient level of care appropriate due to severity of illness   Dispo: The patient is from: Home              Anticipated d/c is to: Home              Anticipated d/c date is: 3 days              Patient currently is not medically stable to d/c.   Consultants:  None   Procedures:  None   Microbiology: None   Antibacterials: None    Objective: Vitals:   05/21/20 2230 05/21/20 2258 05/22/20 0430 05/22/20 1237  BP:  (!) 159/85 Marland Kitchen)  158/104 (!) 157/116  Pulse: 100 95 (!) 105 93  Resp: (!) 24 (!) 22 (!) 22 (!) 23  Temp: 99.1 F (37.3 C) 99.8 F (37.7 C) 98.7 F (37.1 C) 98.4 F (36.9 C)  TempSrc: Oral Oral Oral Oral  SpO2: 99% 100% 93% 100%  Weight:  (!) 174.6 kg    Height:  5\' 2"  (1.575 m)      Intake/Output Summary (Last 24 hours) at 05/22/2020 1509 Last data filed at 05/22/2020 1021 Gross per 24 hour  Intake 720 ml  Output --  Net 720 ml   Filed Weights   05/21/20 1849 05/21/20 2258  Weight: (!) 140.6 kg (!) 174.6 kg    Examination:  Constitutional: NAD Eyes: no scleral icterus ENMT:  Mucous membranes are moist.  Neck: normal, supple Respiratory: Unable to adequately clear breath sounds due to morbid obesity but no significant wheezing Cardiovascular: Unable to hear adequate heart sounds due to morbid obesity.  Trace edema. Abdomen: non distended, no tenderness. Bowel sounds positive.  Musculoskeletal: no clubbing / cyanosis.  Skin: no rashes Neurologic: Nonfocal, equal strength  Data Reviewed: I have independently reviewed following labs and imaging studies   CBC: Recent Labs  Lab 05/19/20 1121 05/21/20 2027 05/22/20 0544  WBC 4.8 8.4 7.8  NEUTROABS 3.1 6.6 6.5  HGB 10.5* 10.5* 10.4*  HCT 34.9* 34.1* 35.2*  MCV 93.1 91.4 94.9  PLT 189 185 192   Basic Metabolic Panel: Recent Labs  Lab 05/19/20 1121 05/21/20 2027 05/22/20 0544  NA 133* 133* 135  K 3.8 3.5 4.7  CL 96* 94* 96*  CO2 29 30 29   GLUCOSE 296* 323* 346*  BUN 8 7 10   CREATININE 0.70 0.75 0.84  CALCIUM 8.4* 8.1* 8.0*  MG  --   --  2.1  PHOS  --   --  2.4*   GFR: Estimated Creatinine Clearance: 158.7 mL/min (by C-G formula based on SCr of 0.84 mg/dL). Liver Function Tests: Recent Labs  Lab 05/19/20 1121 05/21/20 2027 05/22/20 0544  AST 132* 48* 42*  ALT 96* 48* 45*  ALKPHOS 56 54 54  BILITOT 0.4 0.7 0.5  PROT 7.6 7.7 8.1  ALBUMIN 3.5 3.2* 3.3*   No results for input(s): LIPASE, AMYLASE in the last 168 hours. No results for input(s): AMMONIA in the last 168 hours. Coagulation Profile: No results for input(s): INR, PROTIME in the last 168 hours. Cardiac Enzymes: No results for input(s): CKTOTAL, CKMB, CKMBINDEX, TROPONINI in the last 168 hours. BNP (last 3 results) No results for input(s): PROBNP in the last 8760 hours. HbA1C: No results for input(s): HGBA1C in the last 72 hours. CBG: Recent Labs  Lab 05/21/20 2328 05/22/20 1003 05/22/20 1234  GLUCAP 419* 378* 360*   Lipid Profile: Recent Labs    05/21/20 2027  TRIG 125   Thyroid Function Tests: No results for  input(s): TSH, T4TOTAL, FREET4, T3FREE, THYROIDAB in the last 72 hours. Anemia Panel: Recent Labs    05/21/20 2027 05/22/20 0544  FERRITIN 225 236   Urine analysis:    Component Value Date/Time   COLORURINE YELLOW 02/26/2020 1150   APPEARANCEUR CLEAR 02/26/2020 1150   LABSPEC 1.039 (H) 02/26/2020 1150   PHURINE 5.0 02/26/2020 1150   GLUCOSEU NEGATIVE 02/26/2020 1150   HGBUR NEGATIVE 02/26/2020 1150   BILIRUBINUR NEGATIVE 02/26/2020 1150   KETONESUR NEGATIVE 02/26/2020 1150   PROTEINUR 100 (A) 02/26/2020 1150   UROBILINOGEN 0.2 09/04/2014 1001   NITRITE NEGATIVE 02/26/2020 1150   LEUKOCYTESUR NEGATIVE  02/26/2020 1150   Sepsis Labs: Invalid input(s): PROCALCITONIN, LACTICIDVEN  Recent Results (from the past 240 hour(s))  Resp Panel by RT-PCR (Flu A&B, Covid) Nasopharyngeal Swab     Status: Abnormal   Collection Time: 05/19/20  2:56 PM   Specimen: Nasopharyngeal Swab; Nasopharyngeal(NP) swabs in vial transport medium  Result Value Ref Range Status   SARS Coronavirus 2 by RT PCR POSITIVE (A) NEGATIVE Final    Comment: RESULT CALLED TO, READ BACK BY AND VERIFIED WITH: WALISIEWIZZ,K RN @1726  ON 05/19/20 JACKSON,K (NOTE) SARS-CoV-2 target nucleic acids are DETECTED.  The SARS-CoV-2 RNA is generally detectable in upper respiratory specimens during the acute phase of infection. Positive results are indicative of the presence of the identified virus, but do not rule out bacterial infection or co-infection with other pathogens not detected by the test. Clinical correlation with patient history and other diagnostic information is necessary to determine patient infection status. The expected result is Negative.  Fact Sheet for Patients: 14/5/21  Fact Sheet for Healthcare Providers: BloggerCourse.com  This test is not yet approved or cleared by the [employer] FDA and  has been authorized for detection and/or diagnosis  of SARS-CoV-2 by FDA under an Emergency Use Authorization (EUA).  This EUA will remain in effect (meaning this t est can be used) for the duration of  the COVID-19 declaration under Section 564(b)(1) of the Act, 21 U.S.C. section 360bbb-3(b)(1), unless the authorization is terminated or revoked sooner.     Influenza A by PCR NEGATIVE NEGATIVE Final   Influenza B by PCR NEGATIVE NEGATIVE Final    Comment: (NOTE) The Xpert Xpress SARS-CoV-2/FLU/RSV plus assay is intended as an aid in the diagnosis of influenza from Nasopharyngeal swab specimens and should not be used as a sole basis for treatment. Nasal washings and aspirates are unacceptable for Xpert Xpress SARS-CoV-2/FLU/RSV testing.  Fact Sheet for Patients: [place]  Fact Sheet for Healthcare Providers: BloggerCourse.com  This test is not yet approved or cleared by the [employer] FDA and has been authorized for detection and/or diagnosis of SARS-CoV-2 by FDA under an Emergency Use Authorization (EUA). This EUA will remain in effect (meaning this test can be used) for the duration of the COVID-19 declaration under Section 564(b)(1) of the Act, 21 U.S.C. section 360bbb-3(b)(1), unless the authorization is terminated or revoked.  Performed at [facility], [address] [place], [place]   Blood Culture (routine x 2)     Status: None (Preliminary result)   Collection Time: 05/21/20  8:27 PM   Specimen: BLOOD  Result Value Ref Range Status   Specimen Description   Final    BLOOD LEFT ANTECUBITAL Performed at [facility], [address], [place], [place]    Special Requests   Final    BOTTLES DRAWN AEROBIC AND ANAEROBIC Blood Culture adequate volume Performed at [facility], [address] [place], [place]    Culture   Final    NO GROWTH < 12 HOURS Performed at [facility], [address] [place], [place]    Report Status PENDING  Incomplete  Blood Culture (routine x 2)     Status: None (Preliminary result)   Collection Time: 05/21/20  8:27 PM   Specimen: BLOOD  Result Value Ref Range Status   Specimen Description   Final    BLOOD LEFT HAND Performed at [facility], [address], [place], [place]  Special Requests   Final    BOTTLES DRAWN AEROBIC AND ANAEROBIC Blood Culture results may not be optimal due to an excessive volume of blood received in culture bottles Performed at [facility], [address] [place], [place] [address]    Culture   Final    NO GROWTH < 12 HOURS Performed at [facility], [address] [place], [place] [address]    Report Status PENDING  Incomplete      Radiology Studies: DG Chest Port 1 View  Result Date: 05/21/2020 CLINICAL DATA:  COVID diagnosis EXAM: PORTABLE CHEST 1 VIEW COMPARISON:  May 19, 2020 FINDINGS: The lung volumes are low. There are diffuse hazy bilateral airspace opacities that are new from prior study. Heart size is enlarged. There is no pneumothorax. No large pleural effusion. Evaluation is limited by patient body habitus. IMPRESSION: New diffuse hazy bilateral airspace opacities consistent with multifocal pneumonia (viral or bacterial). Electronically Signed   By: Katherine Mantlehristopher  Green M.D.   On: 05/21/2020 20:12   Pamella Pertostin Maguire Killmer, MD, PhD [facility]  Between 7 am - 7 pm I am available, please contact me via Amion or Securechat  Between 7 pm - 7 am I am not available, please contact night coverage MD/APP via Amion

## 2020-05-22 NOTE — Progress Notes (Signed)
PHARMACIST - PHYSICIAN COMMUNICATION  CONCERNING: Antibiotic IV to Oral Route Change Policy  RECOMMENDATION: This patient is receiving azithromycin by the intravenous route.  Based on criteria approved by the Pharmacy and Therapeutics Committee, the antibiotic(s) is/are being converted to the equivalent oral dose form(s).   DESCRIPTION: These criteria include:  Patient being treated for a respiratory tract infection, urinary tract infection, cellulitis or clostridium difficile associated diarrhea if on metronidazole  The patient is not neutropenic and does not exhibit a GI malabsorption state  The patient is eating (either orally or via tube) and/or has been taking other orally administered medications for a least 24 hours  The patient is improving clinically and has a Tmax < 100.5  If you have questions about this conversion, please contact the Pharmacy Department  []  ( [phone] )  [place] []  ( [phone] )  [facility] []  ( [phone]  [place] []  ( [phone] )  [facility] [x]  ( [phone] )  [facility]  

## 2020-05-22 NOTE — Progress Notes (Signed)
Pt unable to tolerate CPAP qhs.  Pt wears nasal pillows with her CPAP at home.  Explained to the Pt that we only have nasal masks to provide and Pt agreed to try.  Pt put the nasal mask on for about 1 minute but pulled it off and stated she was unable to tolerate it and would instead just sleep with her nasal cannula in.  Machine remains in room in case Pt changes her mind.

## 2020-05-23 DIAGNOSIS — J9600 Acute respiratory failure, unspecified whether with hypoxia or hypercapnia: Secondary | ICD-10-CM | POA: Diagnosis not present

## 2020-05-23 DIAGNOSIS — I10 Essential (primary) hypertension: Secondary | ICD-10-CM | POA: Diagnosis not present

## 2020-05-23 DIAGNOSIS — E119 Type 2 diabetes mellitus without complications: Secondary | ICD-10-CM

## 2020-05-23 DIAGNOSIS — K219 Gastro-esophageal reflux disease without esophagitis: Secondary | ICD-10-CM

## 2020-05-23 DIAGNOSIS — U071 COVID-19: Secondary | ICD-10-CM | POA: Diagnosis not present

## 2020-05-23 DIAGNOSIS — J9602 Acute respiratory failure with hypercapnia: Secondary | ICD-10-CM | POA: Diagnosis not present

## 2020-05-23 LAB — COMPREHENSIVE METABOLIC PANEL
ALT: 38 U/L (ref 0–44)
AST: 32 U/L (ref 15–41)
Albumin: 3.1 g/dL — ABNORMAL LOW (ref 3.5–5.0)
Alkaline Phosphatase: 48 U/L (ref 38–126)
Anion gap: 12 (ref 5–15)
BUN: 21 mg/dL — ABNORMAL HIGH (ref 6–20)
CO2: 27 mmol/L (ref 22–32)
Calcium: 8.5 mg/dL — ABNORMAL LOW (ref 8.9–10.3)
Chloride: 94 mmol/L — ABNORMAL LOW (ref 98–111)
Creatinine, Ser: 0.8 mg/dL (ref 0.44–1.00)
GFR, Estimated: >60 mL/min (ref >60)
Glucose, Bld: 348 mg/dL — ABNORMAL HIGH (ref 70–99)
Potassium: 4.7 mmol/L (ref 3.5–5.1)
Sodium: 133 mmol/L — ABNORMAL LOW (ref 135–145)
Total Bilirubin: 0.5 mg/dL (ref 0.3–1.2)
Total Protein: 7.9 g/dL (ref 6.5–8.1)

## 2020-05-23 LAB — CBC WITH DIFFERENTIAL/PLATELET
Abs Immature Granulocytes: 0.02 10*3/uL (ref 0.00–0.07)
Basophils Absolute: 0 10*3/uL (ref 0.0–0.1)
Basophils Relative: 0 %
Eosinophils Absolute: 0 10*3/uL (ref 0.0–0.5)
Eosinophils Relative: 0 %
HCT: 34.1 % — ABNORMAL LOW (ref 36.0–46.0)
Hemoglobin: 10.1 g/dL — ABNORMAL LOW (ref 12.0–15.0)
Immature Granulocytes: 0 %
Lymphocytes Relative: 17 %
Lymphs Abs: 0.9 10*3/uL (ref 0.7–4.0)
MCH: 28 pg (ref 26.0–34.0)
MCHC: 29.6 g/dL — ABNORMAL LOW (ref 30.0–36.0)
MCV: 94.5 fL (ref 80.0–100.0)
Monocytes Absolute: 0.5 10*3/uL (ref 0.1–1.0)
Monocytes Relative: 10 %
Neutro Abs: 3.8 10*3/uL (ref 1.7–7.7)
Neutrophils Relative %: 73 %
Platelets: 217 10*3/uL (ref 150–400)
RBC: 3.61 MIL/uL — ABNORMAL LOW (ref 3.87–5.11)
RDW: 14.5 % (ref 11.5–15.5)
WBC: 5.2 10*3/uL (ref 4.0–10.5)
nRBC: 0 % (ref 0.0–0.2)

## 2020-05-23 LAB — GLUCOSE, CAPILLARY
Glucose-Capillary: 380 mg/dL — ABNORMAL HIGH (ref 70–99)
Glucose-Capillary: 420 mg/dL — ABNORMAL HIGH (ref 70–99)
Glucose-Capillary: 409 mg/dL — ABNORMAL HIGH (ref 70–99)
Glucose-Capillary: 390 mg/dL — ABNORMAL HIGH (ref 70–99)

## 2020-05-23 LAB — D-DIMER, QUANTITATIVE: D-Dimer, Quant: 0.43 ug/mL-FEU (ref 0.00–0.50)

## 2020-05-23 LAB — PHOSPHORUS: Phosphorus: 2.5 mg/dL (ref 2.5–4.6)

## 2020-05-23 LAB — FERRITIN: Ferritin: 246 ng/mL (ref 11–307)

## 2020-05-23 LAB — C-REACTIVE PROTEIN: CRP: 16 mg/dL — ABNORMAL HIGH (ref <1.0)

## 2020-05-23 LAB — MAGNESIUM: Magnesium: 2.5 mg/dL — ABNORMAL HIGH (ref 1.7–2.4)

## 2020-05-23 MED ORDER — INSULIN ASPART 100 UNIT/ML ~~LOC~~ SOLN
6.0000 [IU] | Freq: Three times a day (TID) | SUBCUTANEOUS | Status: DC
  Administered 2020-05-23 – 2020-05-25 (×6): 6 [IU] via SUBCUTANEOUS

## 2020-05-23 MED ORDER — INSULIN DETEMIR 100 UNIT/ML ~~LOC~~ SOLN
30.0000 [IU] | Freq: Two times a day (BID) | SUBCUTANEOUS | Status: DC
  Administered 2020-05-23 – 2020-05-25 (×4): 30 [IU] via SUBCUTANEOUS
  Filled 2020-05-23 (×5): qty 0.3

## 2020-05-23 NOTE — Progress Notes (Signed)
Reviewed over patients diabetes handbook with her & answered some questions. She is very eager/willing to learn. She is in need of a lot of education on diabetes & BG management, healthy meal choices for diabetes.  ** Patient lives with grandmother so she will need to be educated as well.  **Glucometer will be needed upon discharge for patient.

## 2020-05-23 NOTE — Progress Notes (Signed)
Pt refused CPAP qhs, unable to tolerate our nasal mask.  Instructed the Pt that she is able to have someone bring her nasal pillows in if she wishes.  Pt encouraged to contact RT should she change her mind.

## 2020-05-23 NOTE — Progress Notes (Signed)
PROGRESS NOTE  Yvette Gibbs URK:[id number] DOB: 01/08/1991 DOA: 05/21/2020 PCP: Marcine Matar, MD   LOS: 2 days   Brief Narrative / Interim history: 29 year old female with history of diabetes, HTN, depression, super morbid obesity, OSA, asthma who was diagnosed with COVID-19 couple of days ago, seen in the ER and at that time she was satting well on room air and was sent home.  Her symptoms progressed, became more short of breath and came back to the hospital eventually being admitted on 12/7.  She was found to be hypoxic.  Subjective / 24h Interval events: Feeling a little bit better, has more energy  Assessment & Plan:  Principal Problem Acute Hypoxic Respiratory Failure due to Covid-19 Viral Illness, sepsis due to viremia with fever of 102 on admission -Requiring high flow oxygen, started on remdesivir, steroids, baricitinib, continue.  Procalcitonin was low, lactic acid was normal and there is no need for systemic antibiotics.  Discontinue azithromycin. -Incentive spirometry, flutter valve, proning as able -She is morbidly obese covering her very high risk for poor outcome -Clinically stable today, on 3L [place], seems to be improving, finishing remdesivir on Saturday and may be able to go home then if all goes well  COVID-19 Labs  Recent Labs    05/21/20 2027 05/22/20 0544 05/23/20 0434  DDIMER 0.47 0.47 0.43  FERRITIN 225 236 246  LDH 420*  --   --   CRP 17.8* 21.2* 16.0*   Lab Results  Component Value Date   SARSCOV2NAA POSITIVE (A) 05/19/2020   SARSCOV2NAA NEGATIVE 02/26/2020   SARSCOV2NAA NEGATIVE 03/03/2019   Active Problems Morbid obesity -Patient will benefit from weight loss  OSA -Nightly CPAP  Type 2 diabetes mellitus -Continue Levemir, sliding scale. Still hyperglycemic, continue to adjust insulins  CBG (last 3)  Recent Labs    05/22/20 1728 05/22/20 2156 05/23/20 0749  GLUCAP 325* 317* 380*    Scheduled Meds: . (feeding supplement)  PROSource Plus  30 mL Oral TID BM  . amLODipine  5 mg Oral Daily  . baricitinib  4 mg Oral Daily  . enoxaparin (LOVENOX) injection  80 mg Subcutaneous Q24H  . insulin aspart  0-20 Units Subcutaneous TID WC  . insulin aspart  0-5 Units Subcutaneous QHS  . insulin aspart  4 Units Subcutaneous TID WC  . insulin detemir  25 Units Subcutaneous BID  . Ipratropium-Albuterol  1 puff Inhalation Q6H  . linagliptin  5 mg Oral Daily  . methylPREDNISolone (SOLU-MEDROL) injection  80 mg Intravenous Q12H  . multivitamin with minerals  1 tablet Oral Daily  . Ensure Max Protein  11 oz Oral BID   Continuous Infusions: . remdesivir 100 mg in NS 100 mL 100 mg (05/22/20 1034)   PRN Meds:.acetaminophen, chlorpheniramine-HYDROcodone, guaiFENesin-dextromethorphan, ondansetron **OR** ondansetron (ZOFRAN) IV  DVT prophylaxis: Lovenox Code Status: Full code Family Communication: called grandmother   Status is: Inpatient  Remains inpatient appropriate because:Inpatient level of care appropriate due to severity of illness   Dispo: The patient is from: Home              Anticipated d/c is to: Home              Anticipated d/c date is: 3 days              Patient currently is not medically stable to d/c.   Consultants:  None   Procedures:  None   Microbiology: None   Antibacterials: None    Objective: Vitals:  05/22/20 1237 05/22/20 2159 05/22/20 2257 05/23/20 0427  BP: (!) 157/116 (!) 148/99  137/84  Pulse: 93 84  81  Resp: (!) 23 20  17   Temp: 98.4 F (36.9 C) 97.9 F (36.6 C)  98.6 F (37 C)  TempSrc: Oral Oral  Oral  SpO2: 100% 100% 100% 100%  Weight:      Height:        Intake/Output Summary (Last 24 hours) at 05/23/2020 1043 Last data filed at 05/23/2020 14/02/2020 Gross per 24 hour  Intake 340 ml  Output 200 ml  Net 140 ml   Filed Weights   05/21/20 1849 05/21/20 2258  Weight: (!) 140.6 kg (!) 174.6 kg    Examination:  Constitutional: NAD Eyes: no scleral icterus ENMT:  Mucous membranes are moist.  Neck: normal, supple Respiratory: Unable to adequately clear breath sounds due to morbid obesity but no significant wheezing Cardiovascular: Unable to hear adequate heart sounds due to morbid obesity.  Trace edema. Abdomen: non distended, no tenderness. Bowel sounds positive.  Musculoskeletal: no clubbing / cyanosis.  Skin: no rashes Neurologic: Nonfocal, equal strength  Data Reviewed: I have independently reviewed following labs and imaging studies   CBC: Recent Labs  Lab 05/19/20 1121 05/21/20 2027 05/22/20 0544 05/23/20 0434  WBC 4.8 8.4 7.8 5.2  NEUTROABS 3.1 6.6 6.5 3.8  HGB 10.5* 10.5* 10.4* 10.1*  HCT 34.9* 34.1* 35.2* 34.1*  MCV 93.1 91.4 94.9 94.5  PLT 189 185 192 217   Basic Metabolic Panel: Recent Labs  Lab 05/19/20 1121 05/21/20 2027 05/22/20 0544 05/23/20 0434  NA 133* 133* 135 133*  K 3.8 3.5 4.7 4.7  CL 96* 94* 96* 94*  CO2 29 30 29 27   GLUCOSE 296* 323* 346* 348*  BUN 8 7 10  21*  CREATININE 0.70 0.75 0.84 0.80  CALCIUM 8.4* 8.1* 8.0* 8.5*  MG  --   --  2.1 2.5*  PHOS  --   --  2.4* 2.5   GFR: Estimated Creatinine Clearance: 166.6 mL/min (by C-G formula based on SCr of 0.8 mg/dL). Liver Function Tests: Recent Labs  Lab 05/19/20 1121 05/21/20 2027 05/22/20 0544 05/23/20 0434  AST 132* 48* 42* 32  ALT 96* 48* 45* 38  ALKPHOS 56 54 54 48  BILITOT 0.4 0.7 0.5 0.5  PROT 7.6 7.7 8.1 7.9  ALBUMIN 3.5 3.2* 3.3* 3.1*   No results for input(s): LIPASE, AMYLASE in the last 168 hours. No results for input(s): AMMONIA in the last 168 hours. Coagulation Profile: No results for input(s): INR, PROTIME in the last 168 hours. Cardiac Enzymes: No results for input(s): CKTOTAL, CKMB, CKMBINDEX, TROPONINI in the last 168 hours. BNP (last 3 results) No results for input(s): PROBNP in the last 8760 hours. HbA1C: No results for input(s): HGBA1C in the last 72 hours. CBG: Recent Labs  Lab 05/22/20 1003 05/22/20 1234  05/22/20 1728 05/22/20 2156 05/23/20 0749  GLUCAP 378* 360* 325* 317* 380*   Lipid Profile: Recent Labs    05/21/20 2027  TRIG 125   Thyroid Function Tests: No results for input(s): TSH, T4TOTAL, FREET4, T3FREE, THYROIDAB in the last 72 hours. Anemia Panel: Recent Labs    05/22/20 0544 05/23/20 0434  FERRITIN 236 246   Urine analysis:    Component Value Date/Time   COLORURINE YELLOW 02/26/2020 1150   APPEARANCEUR CLEAR 02/26/2020 1150   LABSPEC 1.039 (H) 02/26/2020 1150   PHURINE 5.0 02/26/2020 1150   GLUCOSEU NEGATIVE 02/26/2020 1150   HGBUR NEGATIVE 02/26/2020  1150   BILIRUBINUR NEGATIVE 02/26/2020 1150   KETONESUR NEGATIVE 02/26/2020 1150   PROTEINUR 100 (A) 02/26/2020 1150   UROBILINOGEN 0.2 09/04/2014 1001   NITRITE NEGATIVE 02/26/2020 1150   LEUKOCYTESUR NEGATIVE 02/26/2020 1150   Sepsis Labs: Invalid input(s): PROCALCITONIN, LACTICIDVEN  Recent Results (from the past 240 hour(s))  Resp Panel by RT-PCR (Flu A&B, Covid) Nasopharyngeal Swab     Status: Abnormal   Collection Time: 05/19/20  2:56 PM   Specimen: Nasopharyngeal Swab; Nasopharyngeal(NP) swabs in vial transport medium  Result Value Ref Range Status   SARS Coronavirus 2 by RT PCR POSITIVE (A) NEGATIVE Final    Comment: RESULT CALLED TO, READ BACK BY AND VERIFIED WITH: WALISIEWIZZ,K RN @1726  ON 05/19/20 JACKSON,K (NOTE) SARS-CoV-2 target nucleic acids are DETECTED.  The SARS-CoV-2 RNA is generally detectable in upper respiratory specimens during the acute phase of infection. Positive results are indicative of the presence of the identified virus, but do not rule out bacterial infection or co-infection with other pathogens not detected by the test. Clinical correlation with patient history and other diagnostic information is necessary to determine patient infection status. The expected result is Negative.  Fact Sheet for Patients: BloggerCourse.comhttps://www.fda.gov/media/152166/download  Fact Sheet for  Healthcare Providers: SeriousBroker.ithttps://www.fda.gov/media/152162/download  This test is not yet approved or cleared by the [place] FDA and  has been authorized for detection and/or diagnosis of SARS-CoV-2 by FDA under an Emergency Use Authorization (EUA).  This EUA will remain in effect (meaning this t est can be used) for the duration of  the COVID-19 declaration under Section 564(b)(1) of the Act, 21 U.S.C. section 360bbb-3(b)(1), unless the authorization is terminated or revoked sooner.     Influenza A by PCR NEGATIVE NEGATIVE Final   Influenza B by PCR NEGATIVE NEGATIVE Final    Comment: (NOTE) The Xpert Xpress SARS-CoV-2/FLU/RSV plus assay is intended as an aid in the diagnosis of influenza from Nasopharyngeal swab specimens and should not be used as a sole basis for treatment. Nasal washings and aspirates are unacceptable for Xpert Xpress SARS-CoV-2/FLU/RSV testing.  Fact Sheet for Patients: BloggerCourse.comhttps://www.fda.gov/media/152166/download  Fact Sheet for Healthcare Providers: SeriousBroker.ithttps://www.fda.gov/media/152162/download  This test is not yet approved or cleared by the [place] FDA and has been authorized for detection and/or diagnosis of SARS-CoV-2 by FDA under an Emergency Use Authorization (EUA). This EUA will remain in effect (meaning this test can be used) for the duration of the COVID-19 declaration under Section 564(b)(1) of the Act, 21 U.S.C. section 360bbb-3(b)(1), unless the authorization is terminated or revoked.  Performed at [facility], [address] [place], [place] [address]   Blood Culture (routine x 2)     Status: None (Preliminary result)   Collection Time: 05/21/20  8:27 PM   Specimen: BLOOD  Result Value Ref Range Status   Specimen Description   Final    BLOOD LEFT ANTECUBITAL Performed at [facility], [address] [place], [place] [address]    Special Requests   Final    BOTTLES DRAWN AEROBIC AND ANAEROBIC  Blood Culture adequate volume Performed at [facility], [address] [place], [place] [address]    Culture   Final    NO GROWTH 2 DAYS Performed at [facility], [address] [place], [place] [address]    Report Status PENDING  Incomplete  Blood Culture (routine x 2)     Status: None (Preliminary result)   Collection Time: 05/21/20  8:27 PM  Specimen: BLOOD  Result Value Ref Range Status   Specimen Description   Final    BLOOD LEFT HAND Performed at [facility], [address] [place], [place] [address]    Special Requests   Final    BOTTLES DRAWN AEROBIC AND ANAEROBIC Blood Culture results may not be optimal due to an excessive volume of blood received in culture bottles Performed at [facility], [address], [place], [place] [address]    Culture   Final    NO GROWTH 2 DAYS Performed at [facility], [address] [place], [place] [address]    Report Status PENDING  Incomplete      Radiology Studies: DG Chest Port 1 View  Result Date: 05/21/2020 CLINICAL DATA:  COVID diagnosis EXAM: PORTABLE CHEST 1 VIEW COMPARISON:  May 19, 2020 FINDINGS: The lung volumes are low. There are diffuse hazy bilateral airspace opacities that are new from prior study. Heart size is enlarged. There is no pneumothorax. No large pleural effusion. Evaluation is limited by patient body habitus. IMPRESSION: New diffuse hazy bilateral airspace opacities consistent with multifocal pneumonia (viral or bacterial). Electronically Signed   By: Katherine Mantle M.D.   On: 05/21/2020 20:12   Pamella Pert, MD, PhD [facility]  Between 7 am - 7 pm I am available, please contact me via Amion or Securechat  Between 7 pm - 7 am I am not available, please contact night coverage MD/APP via Amion

## 2020-05-24 DIAGNOSIS — E119 Type 2 diabetes mellitus without complications: Secondary | ICD-10-CM | POA: Diagnosis not present

## 2020-05-24 DIAGNOSIS — I10 Essential (primary) hypertension: Secondary | ICD-10-CM | POA: Diagnosis not present

## 2020-05-24 DIAGNOSIS — J9600 Acute respiratory failure, unspecified whether with hypoxia or hypercapnia: Secondary | ICD-10-CM | POA: Diagnosis not present

## 2020-05-24 DIAGNOSIS — K219 Gastro-esophageal reflux disease without esophagitis: Secondary | ICD-10-CM | POA: Diagnosis not present

## 2020-05-24 DIAGNOSIS — J9602 Acute respiratory failure with hypercapnia: Secondary | ICD-10-CM | POA: Diagnosis not present

## 2020-05-24 DIAGNOSIS — U071 COVID-19: Secondary | ICD-10-CM | POA: Diagnosis not present

## 2020-05-24 LAB — CBC WITH DIFFERENTIAL/PLATELET
Abs Immature Granulocytes: 0.02 10*3/uL (ref 0.00–0.07)
Basophils Absolute: 0 10*3/uL (ref 0.0–0.1)
Basophils Relative: 0 %
Eosinophils Absolute: 0 10*3/uL (ref 0.0–0.5)
Eosinophils Relative: 0 %
HCT: 33.9 % — ABNORMAL LOW (ref 36.0–46.0)
Hemoglobin: 9.8 g/dL — ABNORMAL LOW (ref 12.0–15.0)
Immature Granulocytes: 0 %
Lymphocytes Relative: 13 %
Lymphs Abs: 0.8 10*3/uL (ref 0.7–4.0)
MCH: 27.3 pg (ref 26.0–34.0)
MCHC: 28.9 g/dL — ABNORMAL LOW (ref 30.0–36.0)
MCV: 94.4 fL (ref 80.0–100.0)
Monocytes Absolute: 0.5 10*3/uL (ref 0.1–1.0)
Monocytes Relative: 8 %
Neutro Abs: 4.7 10*3/uL (ref 1.7–7.7)
Neutrophils Relative %: 79 %
Platelets: 274 10*3/uL (ref 150–400)
RBC: 3.59 MIL/uL — ABNORMAL LOW (ref 3.87–5.11)
RDW: 14.5 % (ref 11.5–15.5)
WBC: 6 10*3/uL (ref 4.0–10.5)
nRBC: 0.3 % — ABNORMAL HIGH (ref 0.0–0.2)

## 2020-05-24 LAB — HEMOGLOBIN A1C
Hgb A1c MFr Bld: 9 % — ABNORMAL HIGH (ref 4.8–5.6)
Mean Plasma Glucose: 211.6 mg/dL

## 2020-05-24 LAB — COMPREHENSIVE METABOLIC PANEL
ALT: 32 U/L (ref 0–44)
AST: 19 U/L (ref 15–41)
Albumin: 3.3 g/dL — ABNORMAL LOW (ref 3.5–5.0)
Alkaline Phosphatase: 51 U/L (ref 38–126)
Anion gap: 9 (ref 5–15)
BUN: 21 mg/dL — ABNORMAL HIGH (ref 6–20)
CO2: 33 mmol/L — ABNORMAL HIGH (ref 22–32)
Calcium: 9.1 mg/dL (ref 8.9–10.3)
Chloride: 94 mmol/L — ABNORMAL LOW (ref 98–111)
Creatinine, Ser: 0.69 mg/dL (ref 0.44–1.00)
GFR, Estimated: >60 mL/min (ref >60)
Glucose, Bld: 368 mg/dL — ABNORMAL HIGH (ref 70–99)
Potassium: 4.8 mmol/L (ref 3.5–5.1)
Sodium: 136 mmol/L (ref 135–145)
Total Bilirubin: 0.6 mg/dL (ref 0.3–1.2)
Total Protein: 7.6 g/dL (ref 6.5–8.1)

## 2020-05-24 LAB — C-REACTIVE PROTEIN: CRP: 7 mg/dL — ABNORMAL HIGH (ref <1.0)

## 2020-05-24 LAB — D-DIMER, QUANTITATIVE: D-Dimer, Quant: 0.34 ug/mL-FEU (ref 0.00–0.50)

## 2020-05-24 LAB — GLUCOSE, CAPILLARY
Glucose-Capillary: 224 mg/dL — ABNORMAL HIGH (ref 70–99)
Glucose-Capillary: 428 mg/dL — ABNORMAL HIGH (ref 70–99)
Glucose-Capillary: 379 mg/dL — ABNORMAL HIGH (ref 70–99)
Glucose-Capillary: 365 mg/dL — ABNORMAL HIGH (ref 70–99)

## 2020-05-24 LAB — FERRITIN: Ferritin: 204 ng/mL (ref 11–307)

## 2020-05-24 LAB — PHOSPHORUS: Phosphorus: 3 mg/dL (ref 2.5–4.6)

## 2020-05-24 LAB — MAGNESIUM: Magnesium: 2.7 mg/dL — ABNORMAL HIGH (ref 1.7–2.4)

## 2020-05-24 MED ORDER — DEXAMETHASONE 4 MG PO TABS
6.0000 mg | ORAL_TABLET | Freq: Every day | ORAL | Status: DC
  Administered 2020-05-24 – 2020-05-25 (×2): 6 mg via ORAL
  Filled 2020-05-24 (×2): qty 1

## 2020-05-24 MED ORDER — FUROSEMIDE 10 MG/ML IJ SOLN
40.0000 mg | Freq: Once | INTRAMUSCULAR | Status: AC
  Administered 2020-05-24: 40 mg via INTRAVENOUS
  Filled 2020-05-24: qty 4

## 2020-05-24 NOTE — Progress Notes (Signed)
SATURATION QUALIFICATIONS: (This note is used to comply with regulatory documentation for home oxygen)  Patient Saturations on Room Air at Rest = 94%  Patient Saturations on Room Air while Ambulating = 88%  Patient Saturations on 1 Liters of oxygen while Ambulating = 94%  Please briefly explain why patient needs home oxygen: COVID recovery

## 2020-05-24 NOTE — Progress Notes (Signed)
Pt refused CPAP qhs.  Machine remains in room in case Pt changes her mind.

## 2020-05-24 NOTE — Plan of Care (Signed)
  Problem: Activity: Goal: Ability to return to baseline activity level will improve Outcome: Progressing   Problem: Health Behavior/Discharge Planning: Goal: Ability to safely manage health-related needs after discharge will improve Outcome: Progressing   Problem: Education: Goal: Knowledge of risk factors and measures for prevention of condition will improve Outcome: Progressing   Problem: Respiratory: Goal: Will maintain a patent airway Outcome: Progressing Goal: Complications related to the disease process, condition or treatment will be avoided or minimized Outcome: Progressing   Problem: Pain Managment: Goal: General experience of comfort will improve Outcome: Adequate for Discharge   Problem: Safety: Goal: Ability to remain free from injury will improve Outcome: Adequate for Discharge   Problem: Skin Integrity: Goal: Risk for impaired skin integrity will decrease Outcome: Adequate for Discharge   Problem: Cardiovascular: Goal: Ability to achieve and maintain adequate cardiovascular perfusion will improve Outcome: Adequate for Discharge   Problem: Coping: Goal: Psychosocial and spiritual needs will be supported Outcome: Adequate for Discharge

## 2020-05-24 NOTE — Progress Notes (Signed)
PROGRESS NOTE  Yvette Gibbs DGU:[id number] DOB: Jun 08, 1991 DOA: 05/21/2020 PCP: Marcine Matar, MD   LOS: 3 days   Brief Narrative / Interim history: 29 year old female with history of diabetes, HTN, depression, super morbid obesity, OSA, asthma who was diagnosed with COVID-19 couple of days ago, seen in the ER and at that time she was satting well on room air and was sent home.  Her symptoms progressed, became more short of breath and came back to the hospital eventually being admitted on 12/7.  She was found to be hypoxic.  Subjective / 24h Interval events: Feeling a little bit better, has more energy  Assessment & Plan:  Principal Problem Acute Hypoxic Respiratory Failure due to Covid-19 Viral Illness, sepsis due to viremia with fever of 102 on admission -Requiring high flow oxygen on admission, started on remdesivir, steroids, baricitinib, continue.  Procalcitonin was low.  Clinically improving, on 1 L this morning.  Dose Lasix x1. -Incentive spirometry, flutter valve, proning as able -If oxygen levels remain good may go to home tomorrow  COVID-19 Labs  Recent Labs    05/21/20 2027 05/22/20 0544 05/23/20 0434 05/24/20 0457  DDIMER 0.47 0.47 0.43 0.34  FERRITIN 225 236 246 204  LDH 420*  --   --   --   CRP 17.8* 21.2* 16.0* 7.0*   Lab Results  Component Value Date   SARSCOV2NAA POSITIVE (A) 05/19/2020   SARSCOV2NAA NEGATIVE 02/26/2020   SARSCOV2NAA NEGATIVE 03/03/2019   Active Problems Morbid obesity -Patient will benefit from weight loss  OSA -Nightly CPAP  Type 2 diabetes mellitus, poorly controlled with A1c 9.0, with steroid-induced hyperglycemia -Persistently hyperglycemic despite increasing doses of insulin.  Given clinical improvement will stop IV Solu-Medrol and start Decadron rather than adjusting the insulin further  CBG (last 3)  Recent Labs    05/23/20 1621 05/23/20 2015 05/24/20 0732  GLUCAP 409* 390* 224*    Scheduled Meds: . (feeding  supplement) PROSource Plus  30 mL Oral TID BM  . amLODipine  5 mg Oral Daily  . baricitinib  4 mg Oral Daily  . enoxaparin (LOVENOX) injection  80 mg Subcutaneous Q24H  . insulin aspart  0-20 Units Subcutaneous TID WC  . insulin aspart  0-5 Units Subcutaneous QHS  . insulin aspart  6 Units Subcutaneous TID WC  . insulin detemir  30 Units Subcutaneous BID  . Ipratropium-Albuterol  1 puff Inhalation Q6H  . linagliptin  5 mg Oral Daily  . methylPREDNISolone (SOLU-MEDROL) injection  80 mg Intravenous Q12H  . multivitamin with minerals  1 tablet Oral Daily  . Ensure Max Protein  11 oz Oral BID   Continuous Infusions: . remdesivir 100 mg in NS 100 mL 100 mg (05/24/20 1105)   PRN Meds:.acetaminophen, chlorpheniramine-HYDROcodone, guaiFENesin-dextromethorphan, ondansetron **OR** ondansetron (ZOFRAN) IV  DVT prophylaxis: Lovenox Code Status: Full code Family Communication: Discussed with grandmother over the phone   Status is: Inpatient  Remains inpatient appropriate because:Inpatient level of care appropriate due to severity of illness   Dispo: The patient is from: Home              Anticipated d/c is to: Home              Anticipated d/c date is: 1 day              Patient currently is not medically stable to d/c.   Consultants:  None   Procedures:  None   Microbiology: None   Antibacterials: None  Objective: Vitals:   05/23/20 2122 05/24/20 0413 05/24/20 0601 05/24/20 0751  BP:   (!) 161/91   Pulse:   83   Resp:   18   Temp:   98.5 F (36.9 C)   TempSrc:   Oral   SpO2: 97% 98% 97% 97%  Weight:      Height:        Intake/Output Summary (Last 24 hours) at 05/24/2020 1115 Last data filed at 05/24/2020 0600 Gross per 24 hour  Intake 1320 ml  Output --  Net 1320 ml   Filed Weights   05/21/20 1849 05/21/20 2258  Weight: (!) 140.6 kg (!) 174.6 kg    Examination:  Constitutional: No distress, sitting in the chair Eyes: No icterus ENMT: mmm Neck:  normal, supple Respiratory: Unable to adequately clear breath sounds due to morbid obesity but no significant wheezing today Cardiovascular: Unable to hear adequate heart sounds due to morbid obesity but no significant murmurs, trace edema Abdomen: Soft, NT, ND, distant bowel sounds Musculoskeletal: no clubbing / cyanosis.  Skin: No rashes seen Neurologic: Nonfocal, equal strength  Data Reviewed: I have independently reviewed following labs and imaging studies   CBC: Recent Labs  Lab 05/19/20 1121 05/21/20 2027 05/22/20 0544 05/23/20 0434 05/24/20 0457  WBC 4.8 8.4 7.8 5.2 6.0  NEUTROABS 3.1 6.6 6.5 3.8 4.7  HGB 10.5* 10.5* 10.4* 10.1* 9.8*  HCT 34.9* 34.1* 35.2* 34.1* 33.9*  MCV 93.1 91.4 94.9 94.5 94.4  PLT 189 185 192 217 274   Basic Metabolic Panel: Recent Labs  Lab 05/19/20 1121 05/21/20 2027 05/22/20 0544 05/23/20 0434 05/24/20 0457  NA 133* 133* 135 133* 136  K 3.8 3.5 4.7 4.7 4.8  CL 96* 94* 96* 94* 94*  CO2 29 30 29 27  33*  GLUCOSE 296* 323* 346* 348* 368*  BUN 8 7 10  21* 21*  CREATININE 0.70 0.75 0.84 0.80 0.69  CALCIUM 8.4* 8.1* 8.0* 8.5* 9.1  MG  --   --  2.1 2.5* 2.7*  PHOS  --   --  2.4* 2.5 3.0   GFR: Estimated Creatinine Clearance: 166.6 mL/min (by C-G formula based on SCr of 0.69 mg/dL). Liver Function Tests: Recent Labs  Lab 05/19/20 1121 05/21/20 2027 05/22/20 0544 05/23/20 0434 05/24/20 0457  AST 132* 48* 42* 32 19  ALT 96* 48* 45* 38 32  ALKPHOS 56 54 54 48 51  BILITOT 0.4 0.7 0.5 0.5 0.6  PROT 7.6 7.7 8.1 7.9 7.6  ALBUMIN 3.5 3.2* 3.3* 3.1* 3.3*   No results for input(s): LIPASE, AMYLASE in the last 168 hours. No results for input(s): AMMONIA in the last 168 hours. Coagulation Profile: No results for input(s): INR, PROTIME in the last 168 hours. Cardiac Enzymes: No results for input(s): CKTOTAL, CKMB, CKMBINDEX, TROPONINI in the last 168 hours. BNP (last 3 results) No results for input(s): PROBNP in the last 8760  hours. HbA1C: Recent Labs    05/24/20 0457  HGBA1C 9.0*   CBG: Recent Labs  Lab 05/23/20 0749 05/23/20 1157 05/23/20 1621 05/23/20 2015 05/24/20 0732  GLUCAP 380* 420* 409* 390* 224*   Lipid Profile: Recent Labs    05/21/20 2027  TRIG 125   Thyroid Function Tests: No results for input(s): TSH, T4TOTAL, FREET4, T3FREE, THYROIDAB in the last 72 hours. Anemia Panel: Recent Labs    05/23/20 0434 05/24/20 0457  FERRITIN 246 204   Urine analysis:    Component Value Date/Time   COLORURINE YELLOW 02/26/2020 1150  APPEARANCEUR CLEAR 02/26/2020 1150   LABSPEC 1.039 (H) 02/26/2020 1150   PHURINE 5.0 02/26/2020 1150   GLUCOSEU NEGATIVE 02/26/2020 1150   HGBUR NEGATIVE 02/26/2020 1150   BILIRUBINUR NEGATIVE 02/26/2020 1150   KETONESUR NEGATIVE 02/26/2020 1150   PROTEINUR 100 (A) 02/26/2020 1150   UROBILINOGEN 0.2 09/04/2014 1001   NITRITE NEGATIVE 02/26/2020 1150   LEUKOCYTESUR NEGATIVE 02/26/2020 1150   Sepsis Labs: Invalid input(s): PROCALCITONIN, LACTICIDVEN  Recent Results (from the past 240 hour(s))  Resp Panel by RT-PCR (Flu A&B, Covid) Nasopharyngeal Swab     Status: Abnormal   Collection Time: 05/19/20  2:56 PM   Specimen: Nasopharyngeal Swab; Nasopharyngeal(NP) swabs in vial transport medium  Result Value Ref Range Status   SARS Coronavirus 2 by RT PCR POSITIVE (A) NEGATIVE Final    Comment: RESULT CALLED TO, READ BACK BY AND VERIFIED WITH: WALISIEWIZZ,K RN @1726  ON 05/19/20 JACKSON,K (NOTE) SARS-CoV-2 target nucleic acids are DETECTED.  The SARS-CoV-2 RNA is generally detectable in upper respiratory specimens during the acute phase of infection. Positive results are indicative of the presence of the identified virus, but do not rule out bacterial infection or co-infection with other pathogens not detected by the test. Clinical correlation with patient history and other diagnostic information is necessary to determine patient infection status. The  expected result is Negative.  Fact Sheet for Patients: 14/5/21  Fact Sheet for Healthcare Providers: BloggerCourse.com  This test is not yet approved or cleared by the [employer] FDA and  has been authorized for detection and/or diagnosis of SARS-CoV-2 by FDA under an Emergency Use Authorization (EUA).  This EUA will remain in effect (meaning this t est can be used) for the duration of  the COVID-19 declaration under Section 564(b)(1) of the Act, 21 U.S.C. section 360bbb-3(b)(1), unless the authorization is terminated or revoked sooner.     Influenza A by PCR NEGATIVE NEGATIVE Final   Influenza B by PCR NEGATIVE NEGATIVE Final    Comment: (NOTE) The Xpert Xpress SARS-CoV-2/FLU/RSV plus assay is intended as an aid in the diagnosis of influenza from Nasopharyngeal swab specimens and should not be used as a sole basis for treatment. Nasal washings and aspirates are unacceptable for Xpert Xpress SARS-CoV-2/FLU/RSV testing.  Fact Sheet for Patients: [place]  Fact Sheet for Healthcare Providers: BloggerCourse.com  This test is not yet approved or cleared by the [employer] FDA and has been authorized for detection and/or diagnosis of SARS-CoV-2 by FDA under an Emergency Use Authorization (EUA). This EUA will remain in effect (meaning this test can be used) for the duration of the COVID-19 declaration under Section 564(b)(1) of the Act, 21 U.S.C. section 360bbb-3(b)(1), unless the authorization is terminated or revoked.  Performed at [facility], [address], [place], [place]   Blood Culture (routine x 2)     Status: None (Preliminary result)   Collection Time: 05/21/20  8:27 PM   Specimen: BLOOD  Result Value Ref Range Status   Specimen Description   Final    BLOOD LEFT ANTECUBITAL Performed at [facility], [address] [place], [place]    Special Requests   Final    BOTTLES DRAWN AEROBIC AND ANAEROBIC Blood Culture adequate volume Performed at [facility], [address], [place], [place]    Culture   Final    NO GROWTH 3 DAYS Performed at [facility], [address], [place], [place]    Report  Status PENDING  Incomplete  Blood Culture (routine x 2)     Status: None (Preliminary result)   Collection Time: 05/21/20  8:27 PM   Specimen: BLOOD  Result Value Ref Range Status   Specimen Description   Final    BLOOD LEFT HAND Performed at [facility], [address] [place], [place] [address]    Special Requests   Final    BOTTLES DRAWN AEROBIC AND ANAEROBIC Blood Culture results may not be optimal due to an excessive volume of blood received in culture bottles Performed at [facility], [address] [place], [place] [address]    Culture   Final    NO GROWTH 3 DAYS Performed at [facility], [address] [place], [place] [address]    Report Status PENDING  Incomplete      Radiology Studies: No results found. Pamella Pertostin Annalisia Ingber, MD, PhD [facility]  Between 7 am - 7 pm I am available, please contact me via Amion or Securechat  Between 7 pm - 7 am I am not available, please contact night coverage MD/APP via Amion

## 2020-05-24 NOTE — Progress Notes (Signed)
Pt CBG 428 @ 1135 I spoke with Dr. Elvera Lennox. Per order administer 26 units of aspart(Novolog) will continue to monitor.

## 2020-05-25 DIAGNOSIS — I10 Essential (primary) hypertension: Secondary | ICD-10-CM | POA: Diagnosis not present

## 2020-05-25 DIAGNOSIS — U071 COVID-19: Secondary | ICD-10-CM | POA: Diagnosis not present

## 2020-05-25 DIAGNOSIS — J9602 Acute respiratory failure with hypercapnia: Secondary | ICD-10-CM | POA: Diagnosis not present

## 2020-05-25 DIAGNOSIS — E119 Type 2 diabetes mellitus without complications: Secondary | ICD-10-CM | POA: Diagnosis not present

## 2020-05-25 DIAGNOSIS — J9600 Acute respiratory failure, unspecified whether with hypoxia or hypercapnia: Secondary | ICD-10-CM | POA: Diagnosis not present

## 2020-05-25 DIAGNOSIS — K219 Gastro-esophageal reflux disease without esophagitis: Secondary | ICD-10-CM | POA: Diagnosis not present

## 2020-05-25 LAB — CBC WITH DIFFERENTIAL/PLATELET
Abs Immature Granulocytes: 0.15 10*3/uL — ABNORMAL HIGH (ref 0.00–0.07)
Basophils Absolute: 0 10*3/uL (ref 0.0–0.1)
Basophils Relative: 0 %
Eosinophils Absolute: 0 10*3/uL (ref 0.0–0.5)
Eosinophils Relative: 0 %
HCT: 32.4 % — ABNORMAL LOW (ref 36.0–46.0)
Hemoglobin: 10.1 g/dL — ABNORMAL LOW (ref 12.0–15.0)
Immature Granulocytes: 2 %
Lymphocytes Relative: 13 %
Lymphs Abs: 0.9 10*3/uL (ref 0.7–4.0)
MCH: 28.4 pg (ref 26.0–34.0)
MCHC: 31.2 g/dL (ref 30.0–36.0)
MCV: 91 fL (ref 80.0–100.0)
Monocytes Absolute: 0.8 10*3/uL (ref 0.1–1.0)
Monocytes Relative: 11 %
Neutro Abs: 5.3 10*3/uL (ref 1.7–7.7)
Neutrophils Relative %: 74 %
Platelets: 323 10*3/uL (ref 150–400)
RBC: 3.56 MIL/uL — ABNORMAL LOW (ref 3.87–5.11)
RDW: 14.3 % (ref 11.5–15.5)
WBC: 7.2 10*3/uL (ref 4.0–10.5)
nRBC: 0.6 % — ABNORMAL HIGH (ref 0.0–0.2)

## 2020-05-25 LAB — COMPREHENSIVE METABOLIC PANEL
ALT: 27 U/L (ref 0–44)
AST: 23 U/L (ref 15–41)
Albumin: 3 g/dL — ABNORMAL LOW (ref 3.5–5.0)
Alkaline Phosphatase: 48 U/L (ref 38–126)
Anion gap: 11 (ref 5–15)
BUN: 22 mg/dL — ABNORMAL HIGH (ref 6–20)
CO2: 31 mmol/L (ref 22–32)
Calcium: 8.8 mg/dL — ABNORMAL LOW (ref 8.9–10.3)
Chloride: 93 mmol/L — ABNORMAL LOW (ref 98–111)
Creatinine, Ser: 0.73 mg/dL (ref 0.44–1.00)
GFR, Estimated: >60 mL/min (ref >60)
Glucose, Bld: 344 mg/dL — ABNORMAL HIGH (ref 70–99)
Potassium: 3.8 mmol/L (ref 3.5–5.1)
Sodium: 135 mmol/L (ref 135–145)
Total Bilirubin: 0.6 mg/dL (ref 0.3–1.2)
Total Protein: 7.3 g/dL (ref 6.5–8.1)

## 2020-05-25 LAB — PHOSPHORUS: Phosphorus: 2.5 mg/dL (ref 2.5–4.6)

## 2020-05-25 LAB — D-DIMER, QUANTITATIVE: D-Dimer, Quant: 0.36 ug/mL-FEU (ref 0.00–0.50)

## 2020-05-25 LAB — FERRITIN: Ferritin: 140 ng/mL (ref 11–307)

## 2020-05-25 LAB — C-REACTIVE PROTEIN: CRP: 2.8 mg/dL — ABNORMAL HIGH (ref <1.0)

## 2020-05-25 LAB — MAGNESIUM: Magnesium: 2.6 mg/dL — ABNORMAL HIGH (ref 1.7–2.4)

## 2020-05-25 LAB — GLUCOSE, CAPILLARY: Glucose-Capillary: 287 mg/dL — ABNORMAL HIGH (ref 70–99)

## 2020-05-25 MED ORDER — METFORMIN HCL 500 MG PO TABS: 500.0000 mg | ORAL_TABLET | Freq: Two times a day (BID) | ORAL | 3 refills | Status: DC

## 2020-05-25 MED ORDER — AMLODIPINE BESYLATE 5 MG PO TABS
5.0000 mg | ORAL_TABLET | Freq: Every day | ORAL | 0 refills | Status: DC
Start: 2020-05-25 — End: 2020-09-28

## 2020-05-25 MED ORDER — DEXAMETHASONE 6 MG PO TABS: 6.0000 mg | ORAL_TABLET | Freq: Every day | ORAL | 0 refills | Status: AC

## 2020-05-25 NOTE — Discharge Summary (Signed)
Physician Discharge Summary  Yvette Gibbs [name] DOB: 01/26/1991 DOA: 05/21/2020  PCP: Marcine Matar, MD  Admit date: 05/21/2020 Discharge date: 05/25/2020  Admitted From: home Disposition:  home  Recommendations for Outpatient Follow-up:  1. Follow up with PCP in 1-2 weeks  Home Health: none  Equipment/Devices: none   Discharge Condition: stable CODE STATUS: Full code Diet recommendation: diabetic   HPI: Per admitting MD, Yvette Gibbs is a 29 y.o. female with medical history significant of diabetes, hypertension, depression, morbid obesity, obstructive sleep apnea and asthma who was diagnosed with COVID-19 2 days ago.  She was seen in the ER and treated.  Sent home on empiric treatment.  Patient came back today with oxygen sats in the 70s on room air.  She is currently on 100% 10 L nonrebreather back.  She is significantly diminished.  Symptoms started about 2 weeks ago per patient.  She came to the ER where she was treated and found to be okay on room air.  Patient was not vaccinated. She was 92% on room air at the time.  Today however symptoms have gotten worse.  She is now requiring oxygen and will be admitted to the hospital for further evaluation and treatment.Marland Kitchen  Hospital Course / Discharge diagnoses: Principal Problem Acute Hypoxic Respiratory Failure due to Covid-19 Viral Illness, sepsis due to viremia with fever of 102 on admission -patient was admitted to the hospital with moderate to severe Covid pneumonia requiring high flow oxygen on admission.  She was started on steroids, Remdesivir, baricitinib.  She improved with treatment and she was able to be weaned off to room air, able to ambulate without any significant shortness of breath, back to baseline and will be discharged home in stable condition with few additional days of Decadron to complete a total of 10-day course. Sepsis ruled out  Active problems Morbid obesity -Patient will benefit from weight  loss, she seems to be in denial about this OSA -Nightly CPAP Type 2 diabetes mellitus, poorly controlled with A1c 9.0, with steroid-induced hyperglycemia -Persistently hyperglycemic, patient got stated before suggesting that she has diabetes, she is in denial about this and tells me she does not have problems outside the hospital.  I stressed the importance of follow-up with PCP, Metformin however she is in refusal of accepting treatment.  Very high risk of complications in the future especially in the setting of morbid obesity Essential hypertension-patient in denial about this also, stated very clearly that she does not have high blood pressure.  I do recommend treatment and she will be given a prescription on discharge  Discharge Instructions   Allergies as of 05/25/2020      Reactions   Lisinopril    Caused a rash and caused her to sweat a lot.   Fish Allergy Itching, Rash      Medication List    TAKE these medications   acetaminophen 500 MG tablet Commonly known as: TYLENOL Take 1,000 mg by mouth every 6 (six) hours as needed for mild pain or fever.   albuterol 108 (90 Base) MCG/ACT inhaler Commonly known as: VENTOLIN HFA Inhale 1-2 puffs into the lungs every 6 (six) hours as needed for wheezing or shortness of breath.   amLODipine 5 MG tablet Commonly known as: NORVASC Take 1 tablet (5 mg total) by mouth daily.   dexamethasone 6 MG tablet Commonly known as: DECADRON Take 1 tablet (6 mg total) by mouth daily for 5 days.   guaiFENesin-dextromethorphan 100-10 MG/5ML syrup Commonly  known as: ROBITUSSIN DM Take 5 mLs by mouth every 4 (four) hours as needed for cough.   loratadine 10 MG tablet Commonly known as: CLARITIN Take 10 mg by mouth daily as needed for allergies.   metFORMIN 500 MG tablet Commonly known as: Glucophage Take 1 tablet (500 mg total) by mouth 2 (two) times daily with a meal.   omeprazole 20 MG capsule Commonly known as: PRILOSEC Take 1 capsule (20  mg total) by mouth daily.   PRESCRIPTION MEDICATION Inhale 2 puffs into the lungs every 4 (four) hours as needed (wheezing &shortnes of breath). Patient not sure about the name       Follow-up Information    Marcine Matar, MD. Schedule an appointment as soon as possible for a visit in 2 week(s).   Specialty: Internal Medicine Contact information: [address] [place] [address] [phone]               Consultations:  None   Procedures/Studies:  None   DG Chest 2 View  Result Date: 05/19/2020 CLINICAL DATA:  Shortness of breath and cough. EXAM: CHEST - 2 VIEW COMPARISON:  May 19, 2020 FINDINGS: The study is improved given the PA and lateral technique the still limited due to patient body habitus. The heart size is borderline to mildly enlarged. The hila and mediastinum are unchanged. No pneumothorax. No pulmonary nodules or masses. No overt edema. No focal infiltrate identified. IMPRESSION: Limited study due to patient body habitus. No acute abnormality noted. Electronically Signed   By: Gerome Sam III M.D   On: 05/19/2020 14:03   CT Angio Chest PE W and/or Wo Contrast  Result Date: 05/19/2020 CLINICAL DATA:  Fever, weakness, short of breath, chest pain EXAM: CT ANGIOGRAPHY CHEST WITH CONTRAST TECHNIQUE: Multidetector CT imaging of the chest was performed using the standard protocol during bolus administration of intravenous contrast. Multiplanar CT image reconstructions and MIPs were obtained to evaluate the vascular anatomy. CONTRAST:  OMNIPAQUE IOHEXOL 350 MG/ML SOLN COMPARISON:  05/19/2020, 02/26/2020 FINDINGS: Cardiovascular: This is a technically adequate evaluation of the pulmonary vasculature. Evaluation is somewhat limited by patient body habitus. No filling defects or pulmonary emboli. The heart is unremarkable without pericardial effusion. Normal caliber of the thoracic aorta. Mediastinum/Nodes: No enlarged mediastinal, hilar, or axillary  lymph nodes. Thyroid gland, trachea, and esophagus demonstrate no significant findings. Lungs/Pleura: No acute pleural or parenchymal lung disease. Central airways patent. Upper Abdomen: No acute abnormality. Musculoskeletal: No acute or destructive bony lesions. Reconstructed images demonstrate no additional findings. Review of the MIP images confirms the above findings. IMPRESSION: 1. No evidence of pulmonary embolus. 2. No acute intrathoracic process. Electronically Signed   By: Sharlet Salina M.D.   On: 05/19/2020 16:05   DG Chest Port 1 View  Result Date: 05/21/2020 CLINICAL DATA:  COVID diagnosis EXAM: PORTABLE CHEST 1 VIEW COMPARISON:  May 19, 2020 FINDINGS: The lung volumes are low. There are diffuse hazy bilateral airspace opacities that are new from prior study. Heart size is enlarged. There is no pneumothorax. No large pleural effusion. Evaluation is limited by patient body habitus. IMPRESSION: New diffuse hazy bilateral airspace opacities consistent with multifocal pneumonia (viral or bacterial). Electronically Signed   By: Katherine Mantle M.D.   On: 05/21/2020 20:12   DG Chest Portable 1 View  Result Date: 05/19/2020 CLINICAL DATA:  Shortness of breath.  Nasal congestion. EXAM: PORTABLE CHEST 1 VIEW COMPARISON:  February 25, 2020 FINDINGS: The study is limited due to  patient body habitus and poor penetration. Evaluation of the cardiac silhouette is limited on portable imaging. The heart, hila, and mediastinum appear unchanged. No pneumothorax. Opacity is not excluded in the right base. The left base is incompletely visualized. IMPRESSION: The study is significantly limited due to patient body habitus and poor penetration. Opacity is not excluded in the right base and the left base was not completely imaged. Recommend a PA and lateral chest x-ray with better penetration. Electronically Signed   By: Gerome Sam III M.D   On: 05/19/2020 12:21      Subjective: - no chest pain,  shortness of breath, no abdominal pain, nausea or vomiting.   Discharge Exam: BP (!) 149/97 (BP Location: Right Arm)   Pulse 87   Temp 98.8 F (37.1 C) (Oral)   Resp (!) 22   Ht 5\' 2"  (1.575 m)   Wt (!) 174.6 kg   SpO2 95%   BMI 70.42 kg/m   General: Pt is alert, awake, not in acute distress Cardiovascular: RRR, S1/S2 +, no rubs, no gallops Respiratory: CTA bilaterally, no wheezing, no rhonchi Abdominal: Soft, NT, ND, bowel sounds + Extremities: no edema, no cyanosis   The results of significant diagnostics from this hospitalization (including imaging, microbiology, ancillary and laboratory) are listed below for reference.     Microbiology: Recent Results (from the past 240 hour(s))  Resp Panel by RT-PCR (Flu A&B, Covid) Nasopharyngeal Swab     Status: Abnormal   Collection Time: 05/19/20  2:56 PM   Specimen: Nasopharyngeal Swab; Nasopharyngeal(NP) swabs in vial transport medium  Result Value Ref Range Status   SARS Coronavirus 2 by RT PCR POSITIVE (A) NEGATIVE Final    Comment: RESULT CALLED TO, READ BACK BY AND VERIFIED WITH: WALISIEWIZZ,K RN @1726  ON 05/19/20 JACKSON,K (NOTE) SARS-CoV-2 target nucleic acids are DETECTED.  The SARS-CoV-2 RNA is generally detectable in upper respiratory specimens during the acute phase of infection. Positive results are indicative of the presence of the identified virus, but do not rule out bacterial infection or co-infection with other pathogens not detected by the test. Clinical correlation with patient history and other diagnostic information is necessary to determine patient infection status. The expected result is Negative.  Fact Sheet for Patients:  Fact Sheet for Healthcare Providers: 14/5/21  This test is not yet approved or cleared by the [employer] FDA and  has been authorized for detection and/or diagnosis of SARS-CoV-2 by FDA under an  Emergency Use Authorization (EUA).  This EUA will remain in effect (meaning this t est can be used) for the duration of  the COVID-19 declaration under Section 564(b)(1) of the Act, 21 U.S.C. section 360bbb-3(b)(1), unless the authorization is terminated or revoked sooner.     Influenza A by PCR NEGATIVE NEGATIVE Final   Influenza B by PCR NEGATIVE NEGATIVE Final    Comment: (NOTE) The Xpert Xpress SARS-CoV-2/FLU/RSV plus assay is intended as an aid in the diagnosis of influenza from Nasopharyngeal swab specimens and should not be used as a sole basis for treatment. Nasal washings and aspirates are unacceptable for Xpert Xpress SARS-CoV-2/FLU/RSV testing.  Fact Sheet for Patients: [employer]ious[employer]ker.it  Fact Sheet for Healthcare Providers: [place]  This test is not yet approved or cleared by the [employer] FDA and has been authorized for detection and/or diagnosis of SARS-CoV-2 by FDA under an Emergency Use Authorization (EUA). This EUA will remain in effect (meaning this test can be used) for the duration of the COVID-19 declaration under Section  564(b)(1) of the Act, 21 U.S.C. section 360bbb-3(b)(1), unless the authorization is terminated or revoked.  Performed at [facility], [address] [place], [place] [address]   Blood Culture (routine x 2)     Status: None (Preliminary result)   Collection Time: 05/21/20  8:27 PM   Specimen: BLOOD  Result Value Ref Range Status   Specimen Description   Final    BLOOD LEFT ANTECUBITAL Performed at [facility], [address] [place], [place] [address]    Special Requests   Final    BOTTLES DRAWN AEROBIC AND ANAEROBIC Blood Culture adequate volume Performed at [facility], [address] [place], [place] [address]    Culture   Final    NO GROWTH 3 DAYS Performed at [facility], [address]  [place], [place] [address]    Report Status PENDING  Incomplete  Blood Culture (routine x 2)     Status: None (Preliminary result)   Collection Time: 05/21/20  8:27 PM   Specimen: BLOOD  Result Value Ref Range Status   Specimen Description   Final    BLOOD LEFT HAND Performed at [facility], [address] [place], [place] [address]    Special Requests   Final    BOTTLES DRAWN AEROBIC AND ANAEROBIC Blood Culture results may not be optimal due to an excessive volume of blood received in culture bottles Performed at [facility], [address] [place], [place] [address]    Culture   Final    NO GROWTH 3 DAYS Performed at [facility], [address] [place], [place] [address]    Report Status PENDING  Incomplete     Labs: Basic Metabolic Panel: Recent Labs  Lab 05/21/20 2027 05/22/20 0544 05/23/20 0434 05/24/20 0457 05/25/20 0525  NA 133* 135 133* 136 135  K 3.5 4.7 4.7 4.8 3.8  CL 94* 96* 94* 94* 93*  CO2 30 29 27  33* 31  GLUCOSE 323* 346* 348* 368* 344*  BUN 7 10 21* 21* 22*  CREATININE 0.75 0.84 0.80 0.69 0.73  CALCIUM 8.1* 8.0* 8.5* 9.1 8.8*  MG  --  2.1 2.5* 2.7* 2.6*  PHOS  --  2.4* 2.5 3.0 2.5   Liver Function Tests: Recent Labs  Lab 05/21/20 2027 05/22/20 0544 05/23/20 0434 05/24/20 0457 05/25/20 0525  AST 48* 42* 32 19 23  ALT 48* 45* 38 32 27  ALKPHOS 54 54 48 51 48  BILITOT 0.7 0.5 0.5 0.6 0.6  PROT 7.7 8.1 7.9 7.6 7.3  ALBUMIN 3.2* 3.3* 3.1* 3.3* 3.0*   CBC: Recent Labs  Lab 05/21/20 2027 05/22/20 0544 05/23/20 0434 05/24/20 0457 05/25/20 0525  WBC 8.4 7.8 5.2 6.0 7.2  NEUTROABS 6.6 6.5 3.8 4.7 5.3  HGB 10.5* 10.4* 10.1* 9.8* 10.1*  HCT 34.1* 35.2* 34.1* 33.9* 32.4*  MCV 91.4 94.9 94.5 94.4 91.0  PLT 185 192 217 274 323   CBG: Recent Labs  Lab 05/24/20 0732 05/24/20 1135 05/24/20 1547 05/24/20 2129 05/25/20 0803  GLUCAP 224* 428* 379* 365* 287*   Hgb A1c Recent Labs     05/24/20 0457  HGBA1C 9.0*   Lipid Profile No results for input(s): CHOL, HDL, LDLCALC, TRIG, CHOLHDL, LDLDIRECT in the last 72 hours. Thyroid function studies No results for input(s): TSH, T4TOTAL, T3FREE, THYROIDAB in the last 72 hours.  Invalid input(s): FREET3 Urinalysis    Component Value Date/Time  COLORURINE YELLOW 02/26/2020 1150   APPEARANCEUR CLEAR 02/26/2020 1150   LABSPEC 1.039 (H) 02/26/2020 1150   PHURINE 5.0 02/26/2020 1150   GLUCOSEU NEGATIVE 02/26/2020 1150   HGBUR NEGATIVE 02/26/2020 1150   BILIRUBINUR NEGATIVE 02/26/2020 1150   KETONESUR NEGATIVE 02/26/2020 1150   PROTEINUR 100 (A) 02/26/2020 1150   UROBILINOGEN 0.2 09/04/2014 1001   NITRITE NEGATIVE 02/26/2020 1150   LEUKOCYTESUR NEGATIVE 02/26/2020 1150    FURTHER DISCHARGE INSTRUCTIONS:   Get Medicines reviewed and adjusted: Please take all your medications with you for your next visit with your Primary MD   Laboratory/radiological data: Please request your Primary MD to go over all hospital tests and procedure/radiological results at the follow up, please ask your Primary MD to get all Hospital records sent to his/her office.   In some cases, they will be blood work, cultures and biopsy results pending at the time of your discharge. Please request that your primary care M.D. goes through all the records of your hospital data and follows up on these results.   Also Note the following: If you experience worsening of your admission symptoms, develop shortness of breath, life threatening emergency, suicidal or homicidal thoughts you must seek medical attention immediately by calling 911 or calling your MD immediately  if symptoms less severe.   You must read complete instructions/literature along with all the possible adverse reactions/side effects for all the Medicines you take and that have been prescribed to you. Take any new Medicines after you have completely understood and accpet all the possible  adverse reactions/side effects.    Do not drive when taking Pain medications or sleeping medications (Benzodaizepines)   Do not take more than prescribed Pain, Sleep and Anxiety Medications. It is not advisable to combine anxiety,sleep and pain medications without talking with your primary care practitioner   Special Instructions: If you have smoked or chewed Tobacco  in the last 2 yrs please stop smoking, stop any regular Alcohol  and or any Recreational drug use.   Wear Seat belts while driving.   Please note: You were cared for by a hospitalist during your hospital stay. Once you are discharged, your primary care physician will handle any further medical issues. Please note that NO REFILLS for any discharge medications will be authorized once you are discharged, as it is imperative that you return to your primary care physician (or establish a relationship with a primary care physician if you do not have one) for your post hospital discharge needs so that they can reassess your need for medications and monitor your lab values.  Time coordinating discharge: 40 minutes  SIGNED:  Pamella Pert, MD, PhD 05/25/2020, 8:25 AM

## 2020-05-25 NOTE — Discharge Instructions (Signed)
Follow with Marcine Matar, MD in 2 weeks  Very important to lose weight!  Please get a complete blood count and chemistry panel checked by your Primary MD at your next visit, and again as instructed by your Primary MD. Please get your medications reviewed and adjusted by your Primary MD.  Please request your Primary MD to go over all Hospital Tests and Procedure/Radiological results at the follow up, please get all Hospital records sent to your Prim MD by signing hospital release before you go home.  In some cases, there will be blood work, cultures and biopsy results pending at the time of your discharge. Please request that your primary care M.D. goes through all the records of your hospital data and follows up on these results.  If you had Pneumonia of Lung problems at the Hospital: Please get a 2 view Chest X ray done in 6-8 weeks after hospital discharge or sooner if instructed by your Primary MD.  If you have Congestive Heart Failure: Please call your Cardiologist or Primary MD anytime you have any of the following symptoms:  1) 3 pound weight gain in 24 hours or 5 pounds in 1 week  2) shortness of breath, with or without a dry hacking cough  3) swelling in the hands, feet or stomach  4) if you have to sleep on extra pillows at night in order to breathe  Follow cardiac low salt diet and 1.5 lit/day fluid restriction.  If you have diabetes Accuchecks 4 times/day, Once in AM empty stomach and then before each meal. Log in all results and show them to your primary doctor at your next visit. If any glucose reading is under 80 or above 300 call your primary MD immediately.  If you have Seizure/Convulsions/Epilepsy: Please do not drive, operate heavy machinery, participate in activities at heights or participate in high speed sports until you have seen by Primary MD or a Neurologist and advised to do so again. Per [facility] statutes, patients with seizures are not allowed  to drive until they have been seizure-free for six months.  Use caution when using heavy equipment or power tools. Avoid working on ladders or at heights. Take showers instead of baths. Ensure the water temperature is not too high on the home water heater. Do not go swimming alone. Do not lock yourself in a room alone (i.e. bathroom). When caring for infants or small children, sit down when holding, feeding, or changing them to minimize risk of injury to the child in the event you have a seizure. Maintain good sleep hygiene. Avoid alcohol.   If you had Gastrointestinal Bleeding: Please ask your Primary MD to check a complete blood count within one week of discharge or at your next visit. Your endoscopic/colonoscopic biopsies that are pending at the time of discharge, will also need to followed by your Primary MD.  Get Medicines reviewed and adjusted. Please take all your medications with you for your next visit with your Primary MD  Please request your Primary MD to go over all hospital tests and procedure/radiological results at the follow up, please ask your Primary MD to get all Hospital records sent to his/her office.  If you experience worsening of your admission symptoms, develop shortness of breath, life threatening emergency, suicidal or homicidal thoughts you must seek medical attention immediately by calling 911 or calling your MD immediately  if symptoms less severe.  You must read complete instructions/literature along with all the possible adverse reactions/side  effects for all the Medicines you take and that have been prescribed to you. Take any new Medicines after you have completely understood and accpet all the possible adverse reactions/side effects.   Do not drive or operate heavy machinery when taking Pain medications.   Do not take more than prescribed Pain, Sleep and Anxiety Medications  Special Instructions: If you have smoked or chewed Tobacco  in the last 2 yrs please stop  smoking, stop any regular Alcohol  and or any Recreational drug use.  Wear Seat belts while driving.  Please note You were cared for by a hospitalist during your hospital stay. If you have any questions about your discharge medications or the care you received while you were in the hospital after you are discharged, you can call the unit and asked to speak with the hospitalist on call if the hospitalist that took care of you is not available. Once you are discharged, your primary care physician will handle any further medical issues. Please note that NO REFILLS for any discharge medications will be authorized once you are discharged, as it is imperative that you return to your primary care physician (or establish a relationship with a primary care physician if you do not have one) for your aftercare needs so that they can reassess your need for medications and monitor your lab values.  You can reach the hospitalist office at phone [phone] or fax [phone]   If you do not have a primary care physician, you can call [phone] for a physician referral.  Activity: As tolerated with Full fall precautions use walker/cane & assistance as needed    Diet: diabetic  Disposition Home

## 2020-05-26 LAB — CULTURE, BLOOD (ROUTINE X 2)
Culture: NO GROWTH
Culture: NO GROWTH
Special Requests: ADEQUATE

## 2020-05-27 ENCOUNTER — Telehealth: Payer: Self-pay

## 2020-05-27 NOTE — Telephone Encounter (Signed)
Transition Care Management Follow-up Telephone Call  Date of discharge and from where: 05/25/2020, [facility]  How have you been since you were released from the hospital? She said she is feeling fine, her taste is starting to come back  Any questions or concerns? No  Items Reviewed:  Did the pt receive and understand the discharge instructions provided? Yes   Medications obtained and verified? she said she does not have any medications.  she said " i do not have high blood pressure, diabetes or anything."  she then stated that she may have someone pick up the medications for her today.   Other? No   Any new allergies since your discharge? No   Do you have support at home? Yes  , her brother is with her.  Her grandmother is currently in the hospital.  The patient said she is remaining quarantined at home.  COVID + 05/19/2020   Home Care and Equipment/Supplies: Were home health services ordered? no If so, what is the name of the agency? n/a Has the agency set up a time to come to the patient's home? n/a Were any new equipment or medical supplies ordered?  No What is the name of the medical supply agency? n/a Were you able to get the supplies/equipment? n/a Do you have any questions related to the use of the equipment or supplies? No, n/a  Functional Questionnaire: (I = Independent and D = Dependent) ADLs: independent  Follow up appointments reviewed:   PCP Hospital f/u appt confirmed? she did not want to schedule an appointment    Specialist Hospital f/u appt confirmed? none scheduled at this time   Are transportation arrangements needed? sometimes transportation is a problem  If their condition worsens, is the pt aware to call PCP or go to the Emergency Dept.? Yes   Was the patient provided with contact information for the PCP's office or ED?   She has the phone number for the clinic  Was to pt encouraged to call back with questions or concerns?  yes

## 2020-06-04 ENCOUNTER — Telehealth (INDEPENDENT_AMBULATORY_CARE_PROVIDER_SITE_OTHER): Payer: Medicaid Other | Admitting: Nurse Practitioner

## 2020-06-04 DIAGNOSIS — J9600 Acute respiratory failure, unspecified whether with hypoxia or hypercapnia: Secondary | ICD-10-CM

## 2020-06-04 DIAGNOSIS — E119 Type 2 diabetes mellitus without complications: Secondary | ICD-10-CM | POA: Diagnosis not present

## 2020-06-04 DIAGNOSIS — I10 Essential (primary) hypertension: Secondary | ICD-10-CM | POA: Diagnosis not present

## 2020-06-04 DIAGNOSIS — U071 COVID-19: Secondary | ICD-10-CM | POA: Diagnosis not present

## 2020-06-04 NOTE — Progress Notes (Signed)
Virtual Visit via Telephone Note  I connected with Yvette Gibbs on 06/04/20 at  1:30 PM EST by telephone and verified that I am speaking with the correct person using two identifiers.  Location: Patient: home Provider: office   I discussed the limitations, risks, security and privacy concerns of performing an evaluation and management service by telephone and the availability of in person appointments. I also discussed with the patient that there may be a patient responsible charge related to this service. The patient expressed understanding and agreed to proceed.   History of Present Illness:  Patient presents today for televisit.  She was recently admitted to the hospital on 05/21/2020 through 05/25/2020.  She was admitted with Covid pneumonia and acute hypoxic respiratory failure.  Patient was treated with remdesivir, steroids, baricitinib.  Patient was found to be diabetic and hypertensive.  Patient was started on Metformin and Norvasc however patient refuses to take treatment.  She states that she does not have diabetes or hypertension.  Patient states that since she has been discharged from the hospital she has been doing well.  She denies any significant shortness of breath or any recent fever.  She was discharged home from the hospital on room air.  We discussed that she will need follow-up imaging in about 3 weeks.  We discussed that we will set patient up to establish care with a primary care physician to monitor her blood sugar and blood pressures.  Patient is agreeable to this. Denies f/c/s, n/v/d, hemoptysis, PND, chest pain or edema.     Observations/Objective:  Vitals with BMI 05/25/2020 05/24/2020 05/24/2020  Height - - -  Weight - - -  BMI - - -  Systolic 149 152 350  Diastolic 97 94 94  Pulse 87 80 78      Assessment and Plan:  Acute Hypoxic Respiratory Failure due to Covid-19 Viral Illness:  Glad you are better!  Stay well hydrated  Stay active  Deep  breathing exercises  May start vitamin C 2,000 mg daily, vitamin D3 2,000 IU daily, Zinc 220 mg daily, and Quercetin 500 mg twice daily  May take tylenol for fever or pain  May take mucinex twice daily    Type 2 diabetes mellitus, poorly controlled with A1c 9.0, with steroid-induced hyperglycemia:   Refuses diabetic treatment - did not pick up metformin from pharmacy  Essential hypertension:  Refuses medication - did not pick up prescription  Will get appointment set up for patient to establish care with new PCP     Follow Up Instructions:  Follow up: Follow up in 3 weeks or sooner if needed       I discussed the assessment and treatment plan with the patient. The patient was provided an opportunity to ask questions and all were answered. The patient agreed with the plan and demonstrated an understanding of the instructions.   The patient was advised to call back or seek an in-person evaluation if the symptoms worsen or if the condition fails to improve as anticipated.  I provided 22 minutes of non-face-to-face time during this encounter.   Ivonne Andrew, NP

## 2020-06-04 NOTE — Patient Instructions (Addendum)
Acute Hypoxic Respiratory Failure due to Covid-19 Viral Illness:  Glad you are better!  Stay well hydrated  Stay active  Deep breathing exercises  May start vitamin C 2,000 mg daily, vitamin D3 2,000 IU daily, Zinc 220 mg daily, and Quercetin 500 mg twice daily  May take tylenol for fever or pain  May take mucinex twice daily    Type 2 diabetes mellitus, poorly controlled with A1c 9.0, with steroid-induced hyperglycemia:   Refuses diabetic treatment - did not pick up metformin from pharmacy  Essential hypertension:  Refuses medication - did not pick up prescription  Will get appointment set up for patient to establish care with new PCP  Follow up: Follow up in 3 weeks or sooner if needed

## 2020-06-15 DIAGNOSIS — Z419 Encounter for procedure for purposes other than remedying health state, unspecified: Secondary | ICD-10-CM | POA: Diagnosis not present

## 2020-06-25 ENCOUNTER — Telehealth (INDEPENDENT_AMBULATORY_CARE_PROVIDER_SITE_OTHER): Payer: Medicaid Other | Admitting: Nurse Practitioner

## 2020-06-25 DIAGNOSIS — J9600 Acute respiratory failure, unspecified whether with hypoxia or hypercapnia: Secondary | ICD-10-CM

## 2020-06-25 DIAGNOSIS — K219 Gastro-esophageal reflux disease without esophagitis: Secondary | ICD-10-CM | POA: Diagnosis not present

## 2020-06-25 DIAGNOSIS — U071 COVID-19: Secondary | ICD-10-CM | POA: Diagnosis not present

## 2020-06-25 MED ORDER — OMEPRAZOLE 20 MG PO CPDR: 20.0000 mg | DELAYED_RELEASE_CAPSULE | Freq: Every day | ORAL | 3 refills | Status: DC

## 2020-06-25 NOTE — Patient Instructions (Signed)
Acute Hypoxic Respiratory Failure due to Covid-19 Viral Illness:  Glad you are better!  Stay well hydrated  Stay active  Deep breathing exercises  May take tylenol for fever or pain    Type 2 diabetes mellitus, poorly controlled with A1c 9.0, with steroid-induced hyperglycemia:  Refuses diabetic treatment - did not pick up metformin from pharmacy   Essential hypertension:  Refuses medication - did not pick up prescription  Will get appointment set up for patient to establish care with new PCP   GERD:  Refill for omeprazole sent to pharmacy      Follow Up Instructions:  Follow up if needed

## 2020-06-25 NOTE — Progress Notes (Signed)
Virtual Visit via Telephone Note  I connected with Yvette Gibbs on 06/25/20 at  1:00 PM EST by telephone and verified that I am speaking with the correct person using two identifiers.  Location: Patient: home Provider: office   I discussed the limitations, risks, security and privacy concerns of performing an evaluation and management service by telephone and the availability of in person appointments. I also discussed with the patient that there may be a patient responsible charge related to this service. The patient expressed understanding and agreed to proceed.   History of Present Illness:  Patient presents today for post-COVID care clinic visit for televisit.  Patient was last seen here through televisit on 06/04/2020.  She states that she is much improved no longer having significant shortness of breath.  The patient was found to be diabetic and hypertensive and the hospital.  She was started on metformin and Norvasc however patient does refuse treatment.  She states that she does not have diabetes or hypertension.  She does agree to being set up with a primary care physician for a physical. Denies f/c/s, n/v/d, hemoptysis, PND, chest pain or edema.     Observations/Objective:  Vitals with BMI 05/25/2020 05/24/2020 05/24/2020  Height - - -  Weight - - -  BMI - - -  Systolic 149 152 518  Diastolic 97 94 94  Pulse 87 80 78      Assessment and Plan:  Acute Hypoxic Respiratory Failure due to Covid-19 Viral Illness:  Glad you are better!  Stay well hydrated  Stay active  Deep breathing exercises  May take tylenol for fever or pain    Type 2 diabetes mellitus, poorly controlled with A1c 9.0, with steroid-induced hyperglycemia:  Refuses diabetic treatment - did not pick up metformin from pharmacy   Essential hypertension:  Refuses medication - did not pick up prescription  Will get appointment set up for patient to establish care with new  PCP   GERD:  Refill for omeprazole sent to pharmacy    Follow Up Instructions:   Follow up if needed     I discussed the assessment and treatment plan with the patient. The patient was provided an opportunity to ask questions and all were answered. The patient agreed with the plan and demonstrated an understanding of the instructions.   The patient was advised to call back or seek an in-person evaluation if the symptoms worsen or if the condition fails to improve as anticipated.  I provided 22 minutes of non-face-to-face time during this encounter.   Ivonne Andrew, NP

## 2020-07-16 DIAGNOSIS — Z419 Encounter for procedure for purposes other than remedying health state, unspecified: Secondary | ICD-10-CM | POA: Diagnosis not present

## 2020-07-17 ENCOUNTER — Ambulatory Visit: Payer: Medicaid Other | Admitting: Nurse Practitioner

## 2020-08-13 DIAGNOSIS — Z419 Encounter for procedure for purposes other than remedying health state, unspecified: Secondary | ICD-10-CM | POA: Diagnosis not present

## 2020-09-13 DIAGNOSIS — Z419 Encounter for procedure for purposes other than remedying health state, unspecified: Secondary | ICD-10-CM | POA: Diagnosis not present

## 2020-09-17 ENCOUNTER — Emergency Department (HOSPITAL_BASED_OUTPATIENT_CLINIC_OR_DEPARTMENT_OTHER)
Admission: EM | Admit: 2020-09-17 | Discharge: 2020-09-17 | Disposition: A | Discharge status: Home / Self Care | Payer: Medicaid Other | Attending: Emergency Medicine | Admitting: Emergency Medicine

## 2020-09-17 ENCOUNTER — Emergency Department (HOSPITAL_BASED_OUTPATIENT_CLINIC_OR_DEPARTMENT_OTHER): Payer: Medicaid Other

## 2020-09-17 ENCOUNTER — Other Ambulatory Visit: Payer: Self-pay

## 2020-09-17 ENCOUNTER — Encounter (HOSPITAL_BASED_OUTPATIENT_CLINIC_OR_DEPARTMENT_OTHER): Payer: Self-pay | Admitting: Emergency Medicine

## 2020-09-17 DIAGNOSIS — R109 Unspecified abdominal pain: Secondary | ICD-10-CM | POA: Diagnosis not present

## 2020-09-17 DIAGNOSIS — Z8616 Personal history of COVID-19: Secondary | ICD-10-CM | POA: Diagnosis not present

## 2020-09-17 DIAGNOSIS — R6889 Other general symptoms and signs: Secondary | ICD-10-CM | POA: Diagnosis not present

## 2020-09-17 DIAGNOSIS — Z79899 Other long term (current) drug therapy: Secondary | ICD-10-CM | POA: Insufficient documentation

## 2020-09-17 DIAGNOSIS — B373 Candidiasis of vulva and vagina: Secondary | ICD-10-CM | POA: Diagnosis not present

## 2020-09-17 DIAGNOSIS — B349 Viral infection, unspecified: Secondary | ICD-10-CM

## 2020-09-17 DIAGNOSIS — J45909 Unspecified asthma, uncomplicated: Secondary | ICD-10-CM | POA: Diagnosis not present

## 2020-09-17 DIAGNOSIS — R110 Nausea: Secondary | ICD-10-CM | POA: Diagnosis not present

## 2020-09-17 DIAGNOSIS — B379 Candidiasis, unspecified: Secondary | ICD-10-CM | POA: Diagnosis not present

## 2020-09-17 DIAGNOSIS — R1031 Right lower quadrant pain: Secondary | ICD-10-CM | POA: Diagnosis present

## 2020-09-17 DIAGNOSIS — E119 Type 2 diabetes mellitus without complications: Secondary | ICD-10-CM | POA: Diagnosis not present

## 2020-09-17 DIAGNOSIS — Z7984 Long term (current) use of oral hypoglycemic drugs: Secondary | ICD-10-CM | POA: Insufficient documentation

## 2020-09-17 DIAGNOSIS — I10 Essential (primary) hypertension: Secondary | ICD-10-CM | POA: Diagnosis not present

## 2020-09-17 DIAGNOSIS — Z743 Need for continuous supervision: Secondary | ICD-10-CM | POA: Diagnosis not present

## 2020-09-17 LAB — URINALYSIS, ROUTINE W REFLEX MICROSCOPIC
Bilirubin Urine: NEGATIVE
Glucose, UA: 100 mg/dL — AB
Ketones, ur: NEGATIVE mg/dL
Nitrite: NEGATIVE
Protein, ur: >300 mg/dL — AB
Specific Gravity, Urine: >1.03 — ABNORMAL HIGH (ref 1.005–1.030)
pH: 5.5 (ref 5.0–8.0)

## 2020-09-17 LAB — BETA-HYDROXYBUTYRIC ACID: Beta-Hydroxybutyric Acid: 0.13 mmol/L (ref 0.05–0.27)

## 2020-09-17 LAB — BASIC METABOLIC PANEL
Anion gap: 9 (ref 5–15)
BUN: 7 mg/dL (ref 6–20)
CO2: 30 mmol/L (ref 22–32)
Calcium: 9.5 mg/dL (ref 8.9–10.3)
Chloride: 97 mmol/L — ABNORMAL LOW (ref 98–111)
Creatinine, Ser: 0.63 mg/dL (ref 0.44–1.00)
GFR, Estimated: >60 mL/min (ref >60)
Glucose, Bld: 254 mg/dL — ABNORMAL HIGH (ref 70–99)
Potassium: 4.2 mmol/L (ref 3.5–5.1)
Sodium: 136 mmol/L (ref 135–145)

## 2020-09-17 LAB — CBC WITH DIFFERENTIAL/PLATELET
Abs Immature Granulocytes: 0.02 10*3/uL (ref 0.00–0.07)
Basophils Absolute: 0 10*3/uL (ref 0.0–0.1)
Basophils Relative: 1 %
Eosinophils Absolute: 0.1 10*3/uL (ref 0.0–0.5)
Eosinophils Relative: 2 %
HCT: 41.2 % (ref 36.0–46.0)
Hemoglobin: 12.1 g/dL (ref 12.0–15.0)
Immature Granulocytes: 0 %
Lymphocytes Relative: 37 %
Lymphs Abs: 2.1 10*3/uL (ref 0.7–4.0)
MCH: 26 pg (ref 26.0–34.0)
MCHC: 29.4 g/dL — ABNORMAL LOW (ref 30.0–36.0)
MCV: 88.4 fL (ref 80.0–100.0)
Monocytes Absolute: 0.7 10*3/uL (ref 0.1–1.0)
Monocytes Relative: 12 %
Neutro Abs: 2.9 10*3/uL (ref 1.7–7.7)
Neutrophils Relative %: 48 %
Platelets: 281 10*3/uL (ref 150–400)
RBC: 4.66 MIL/uL (ref 3.87–5.11)
RDW: 14.8 % (ref 11.5–15.5)
WBC: 5.8 10*3/uL (ref 4.0–10.5)
nRBC: 0 % (ref 0.0–0.2)

## 2020-09-17 LAB — I-STAT VENOUS BLOOD GAS, ED
Acid-Base Excess: 5 mmol/L — ABNORMAL HIGH (ref 0.0–2.0)
Bicarbonate: 31.8 mmol/L — ABNORMAL HIGH (ref 20.0–28.0)
Calcium, Ion: 1.27 mmol/L (ref 1.15–1.40)
HCT: 38 % (ref 36.0–46.0)
Hemoglobin: 12.9 g/dL (ref 12.0–15.0)
O2 Saturation: 67 %
Potassium: 3.8 mmol/L (ref 3.5–5.1)
Sodium: 137 mmol/L (ref 135–145)
TCO2: 33 mmol/L — ABNORMAL HIGH (ref 22–32)
pCO2, Ven: 54.8 mmHg (ref 44.0–60.0)
pH, Ven: 7.372 (ref 7.250–7.430)
pO2, Ven: 37 mmHg (ref 32.0–45.0)

## 2020-09-17 LAB — HEMOGLOBIN A1C
Hgb A1c MFr Bld: 9.8 % — ABNORMAL HIGH (ref 4.8–5.6)
Mean Plasma Glucose: 234.56 mg/dL

## 2020-09-17 LAB — HEPATIC FUNCTION PANEL
ALT: 37 U/L (ref 0–44)
AST: 40 U/L (ref 15–41)
Albumin: 3.9 g/dL (ref 3.5–5.0)
Alkaline Phosphatase: 67 U/L (ref 38–126)
Bilirubin, Direct: <0.1 mg/dL (ref 0.0–0.2)
Total Bilirubin: 0.3 mg/dL (ref 0.3–1.2)
Total Protein: 8 g/dL (ref 6.5–8.1)

## 2020-09-17 LAB — HCG, QUANTITATIVE, PREGNANCY: hCG, Beta Chain, Quant, S: <1 m[IU]/mL (ref <5)

## 2020-09-17 LAB — URINALYSIS, MICROSCOPIC (REFLEX)

## 2020-09-17 LAB — WET PREP, GENITAL
Clue Cells Wet Prep HPF POC: NONE SEEN
Sperm: NONE SEEN
Trich, Wet Prep: NONE SEEN

## 2020-09-17 LAB — PREGNANCY, URINE: Preg Test, Ur: NEGATIVE

## 2020-09-17 LAB — MAGNESIUM: Magnesium: 1.5 mg/dL — ABNORMAL LOW (ref 1.7–2.4)

## 2020-09-17 LAB — CBG MONITORING, ED: Glucose-Capillary: 207 mg/dL — ABNORMAL HIGH (ref 70–99)

## 2020-09-17 MED ORDER — IBUPROFEN 600 MG PO TABS: 600.0000 mg | ORAL_TABLET | Freq: Four times a day (QID) | ORAL | 0 refills | Status: DC | PRN

## 2020-09-17 MED ORDER — SODIUM CHLORIDE 0.9 % IV BOLUS
1000.0000 mL | Freq: Once | INTRAVENOUS | Status: AC
  Administered 2020-09-17: 1000 mL via INTRAVENOUS

## 2020-09-17 MED ORDER — FLUCONAZOLE 200 MG PO TABS
200.0000 mg | ORAL_TABLET | Freq: Once | ORAL | Status: DC
  Filled 2020-09-17: qty 1

## 2020-09-17 MED ORDER — AMLODIPINE BESYLATE 5 MG PO TABS: 5.0000 mg | ORAL_TABLET | Freq: Every day | ORAL | 2 refills | Status: DC

## 2020-09-17 MED ORDER — IOHEXOL 300 MG/ML  SOLN
100.0000 mL | Freq: Once | INTRAMUSCULAR | Status: AC | PRN
  Administered 2020-09-17: 100 mL via INTRAVENOUS

## 2020-09-17 MED ORDER — METFORMIN HCL 1000 MG PO TABS: 1000.0000 mg | ORAL_TABLET | Freq: Two times a day (BID) | ORAL | 0 refills | Status: DC

## 2020-09-17 MED ORDER — FLUCONAZOLE 200 MG PO TABS: 200.0000 mg | ORAL_TABLET | Freq: Once | ORAL | 0 refills | Status: AC

## 2020-09-17 NOTE — ED Notes (Signed)
Thinks she maybe preg , took 4 preg tests and they came back positive LMP May 30 2020

## 2020-09-17 NOTE — ED Triage Notes (Addendum)
Pt ambulatory to room from ems with c/o of abd pain that started last night , some nausea , denies dysuria or vag d/c no vomiting, pt states she is nottaking meds for her HTN or her Diabetes

## 2020-09-17 NOTE — Discharge Instructions (Addendum)
For your yeast infection, take fluconazole 200 mg tablet once.  For your fever and pain, take tylenol at home, and also ibuprofen as prescribed.  For your high blood pressure, start taking amlodipine 5 mg every day.  Follow up with your primary care clinic this week about your blood pressure control.  You may need higher doses or new medicines.  For your diabetes, you need to be on medications.  Your blood sugars are high.  You should start taking metformin twice per day- once in the morning and once at night.  These pill will help lower your blood sugars and manage your diabetes.  It is very important you eat better.  Diet is a huge part of managing diabetes.  Please follow up with your primary care clinic this week.  *  Your pregnancy test was NEGATIVE today.  You are not pregnant.  Your CT scan did not show signs of infection or serious problems in your belly.

## 2020-09-17 NOTE — Progress Notes (Signed)
VBG order changed to iSTAT order. MD aware.

## 2020-09-17 NOTE — ED Provider Notes (Signed)
[facility] EMERGENCY DEPT Provider Note   CSN: [id number] Arrival date & time: 09/17/20  1102     History Chief Complaint  Patient presents with  . Abdominal Pain    Yvette Gibbs is a 30 y.o. female w/ hx of obesity, diabetes presenting to ED with abdominal pain.  She reports she is having right lower quadrant, cramping abdominal pain for the past 24 hours.  She reports nausea, no vomiting or diarrhea.  She is concerned she may be pregnant, states her last menstrual period was December 16.  She has been sexually active with a female.  She feels like she is been having morning sickness with nausea every morning for the past several weeks.  She has never been pregnant before.  She denies any vaginal bleeding or discharge.  She denies any fevers or chills.  She only took Tylenol this morning for the cramping pain.  She reports she did not intend to get pregnant, but intends to keep the pregnancy if it is viable.  She separately reports to me that she has felt excessively thirsty for several weeks, and feels like she does need to drink water.  Per medical records, patient had covid pneumonia and hypoxic respiratory failure with hospitalization from 12/7-12/11/21.  She was treated with steroids, remdesivir, and baricitinib.  There was an attempt to start patient on metformin and norvasc for HTN and diabetes but patient refused, stating she did not believe she had diabetes or hypertension.  Her hA1C level was 9.0 at the time.  When asked about this she says "I was confused because they were telling me that the COVID medications are making my sugars go high, so I didn't want to be on meds."  Regarding her blood pressure medicine, she says that her PCP was supposed to prescribe some to her but did not.  Therefore she has not taken any medications at this time for chronic medication conditions.  PSHx: Cholecystectomy     HPI     Past Medical History:  Diagnosis Date  . Asthma    . Depression   . Diabetes mellitus type 2, uncontrolled (HCC)   . Extreme obesity   . Family history of adverse reaction to anesthesia    " my grandmother had an allergic reaction  . Hypertension   . Mental disorder   . Obesity   . OSA (obstructive sleep apnea)     Patient Active Problem List   Diagnosis Date Noted  . Acute respiratory failure due to COVID-19 (HCC) 05/21/2020  . Gastroesophageal reflux disease without esophagitis 06/19/2019  . Loud snoring 06/19/2019  . Type 2 diabetes mellitus without complication, without long-term current use of insulin (HCC) 03/04/2019  . Abdominal pain 03/03/2019  . HTN (hypertension) 03/03/2019  . Hyperglycemia 03/03/2019  . Proteinuria 03/03/2019  . HCAP (healthcare-associated pneumonia) 09/07/2015  . Severe sepsis (HCC) 09/07/2015  . Asthma 09/07/2015  . Pleuritic chest pain 09/01/2015  . Morbid obesity (HCC) 09/01/2015  . OSA (obstructive sleep apnea) 09/01/2015  . Influenza A 09/01/2015  . Biliary colic 09/04/2014    Past Surgical History:  Procedure Laterality Date  . APPENDECTOMY    . CHOLECYSTECTOMY N/A 09/05/2014   Procedure: LAPAROSCOPIC CHOLECYSTECTOMY;  Surgeon: Axel Filler, MD;  Location: [facility] OR;  Service: General;  Laterality: N/A;  . TONSILLECTOMY       OB History   No obstetric history on file.     Family History  Problem Relation Age of Onset  . Diabetes Mellitus II  Maternal Grandmother     Social History   Tobacco Use  . Smoking status: Never Smoker  . Smokeless tobacco: Never Used  Vaping Use  . Vaping Use: Never used  Substance Use Topics  . Alcohol use: Yes  . Drug use: Yes    Types: Marijuana    Home Medications Prior to Admission medications   Medication Sig Start Date End Date Taking? Authorizing Provider  amLODipine (NORVASC) 5 MG tablet Take 1 tablet (5 mg total) by mouth daily for 30 doses. 09/17/20 10/17/20 Yes Terald Sleeperrifan, Leyanna Bittman J, MD  fluconazole (DIFLUCAN) 200 MG tablet Take 1 tablet  (200 mg total) by mouth once for 1 dose. 09/17/20 09/17/20 Yes Marvene Strohm, Kermit BaloMatthew J, MD  ibuprofen (ADVIL) 600 MG tablet Take 1 tablet (600 mg total) by mouth every 6 (six) hours as needed for up to 20 doses for fever, mild pain or moderate pain. 09/17/20  Yes Terald Sleeperrifan, Mccall Lomax J, MD  metFORMIN (GLUCOPHAGE) 1000 MG tablet Take 1 tablet (1,000 mg total) by mouth 2 (two) times daily. 09/17/20 10/17/20 Yes Armando Lauman, Kermit BaloMatthew J, MD  acetaminophen (TYLENOL) 500 MG tablet Take 1,000 mg by mouth every 6 (six) hours as needed for mild pain or fever.    [provider]  albuterol (VENTOLIN HFA) 108 (90 Base) MCG/ACT inhaler Inhale 1-2 puffs into the lungs every 6 (six) hours as needed for wheezing or shortness of breath. 03/21/19   Marcine MatarJohnson, Deborah B, MD  amLODipine (NORVASC) 5 MG tablet Take 1 tablet (5 mg total) by mouth daily. Patient not taking: No sig reported 05/25/20   Leatha GildingGherghe, Costin M, MD  guaiFENesin-dextromethorphan (ROBITUSSIN DM) 100-10 MG/5ML syrup Take 5 mLs by mouth every 4 (four) hours as needed for cough. 03/04/19   Dhungel, Theda BelfastNishant, MD  loratadine (CLARITIN) 10 MG tablet Take 10 mg by mouth daily as needed for allergies.    [provider]  metFORMIN (GLUCOPHAGE) 500 MG tablet Take 1 tablet (500 mg total) by mouth 2 (two) times daily with a meal. Patient not taking: No sig reported 05/25/20   Leatha GildingGherghe, Costin M, MD  omeprazole (PRILOSEC) 20 MG capsule Take 1 capsule (20 mg total) by mouth daily. Patient not taking: No sig reported 06/19/19   Marcine MatarJohnson, Deborah B, MD  omeprazole (PRILOSEC) 20 MG capsule Take 1 capsule (20 mg total) by mouth daily. 06/25/20   Ivonne AndrewNichols, Tonya S, NP  PRESCRIPTION MEDICATION Inhale 2 puffs into the lungs every 4 (four) hours as needed (wheezing &shortnes of breath). Patient not sure about the name    [provider]    Allergies    Lisinopril and Fish allergy  Review of Systems   Review of Systems  Constitutional: Negative for chills and fever.   Respiratory: Negative for cough and shortness of breath.   Cardiovascular: Negative for chest pain and palpitations.  Gastrointestinal: Positive for abdominal pain and nausea. Negative for vomiting.  Endocrine: Positive for polydipsia, polyphagia and polyuria.  Genitourinary: Negative for dysuria and hematuria.  Musculoskeletal: Negative for arthralgias and back pain.  Skin: Negative for color change and rash.  Neurological: Negative for syncope and headaches.  All other systems reviewed and are negative.   Physical Exam Updated Vital Signs BP (!) 157/79 (BP Location: Left Arm)   Pulse 99   Temp 99.4 F (37.4 C) (Oral)   Resp 20   Ht 5\' 2"  (1.575 m)   Wt (!) 179.6 kg   SpO2 98%   BMI 72.43 kg/m   Physical Exam Constitutional:  General: She is not in acute distress.    Appearance: She is obese.  HENT:     Head: Normocephalic and atraumatic.  Eyes:     Conjunctiva/sclera: Conjunctivae normal.     Pupils: Pupils are equal, round, and reactive to light.  Cardiovascular:     Rate and Rhythm: Normal rate and regular rhythm.  Pulmonary:     Effort: Pulmonary effort is normal. No respiratory distress.  Abdominal:     General: There is no distension.     Tenderness: There is no abdominal tenderness. There is no guarding or rebound.  Genitourinary:    Comments: GU exam performed with RN chaperone present Yeast infection noted, no significant CMT, no drainage Skin:    General: Skin is warm and dry.  Neurological:     General: No focal deficit present.     Mental Status: She is alert. Mental status is at baseline.  Psychiatric:        Mood and Affect: Mood normal.        Behavior: Behavior normal.     ED Results / Procedures / Treatments   Labs (all labs ordered are listed, but only abnormal results are displayed) Labs Reviewed  WET PREP, GENITAL - Abnormal; Notable for the following components:      Result Value   Yeast Wet Prep HPF POC PRESENT (*)    WBC, Wet  Prep HPF POC FEW (*)    All other components within normal limits  URINALYSIS, ROUTINE W REFLEX MICROSCOPIC - Abnormal; Notable for the following components:   APPearance CLOUDY (*)    Specific Gravity, Urine >1.030 (*)    Glucose, UA 100 (*)    Hgb urine dipstick LARGE (*)    Protein, ur >300 (*)    Leukocytes,Ua TRACE (*)    All other components within normal limits  BASIC METABOLIC PANEL - Abnormal; Notable for the following components:   Chloride 97 (*)    Glucose, Bld 254 (*)    All other components within normal limits  CBC WITH DIFFERENTIAL/PLATELET - Abnormal; Notable for the following components:   MCHC 29.4 (*)    All other components within normal limits  HEMOGLOBIN A1C - Abnormal; Notable for the following components:   Hgb A1c MFr Bld 9.8 (*)    All other components within normal limits  MAGNESIUM - Abnormal; Notable for the following components:   Magnesium 1.5 (*)    All other components within normal limits  URINALYSIS, MICROSCOPIC (REFLEX) - Abnormal; Notable for the following components:   Bacteria, UA MANY (*)    All other components within normal limits  CBG MONITORING, ED - Abnormal; Notable for the following components:   Glucose-Capillary 207 (*)    All other components within normal limits  I-STAT VENOUS BLOOD GAS, ED - Abnormal; Notable for the following components:   Bicarbonate 31.8 (*)    TCO2 33 (*)    Acid-Base Excess 5.0 (*)    All other components within normal limits  PREGNANCY, URINE  HCG, QUANTITATIVE, PREGNANCY  HEPATIC FUNCTION PANEL  BETA-HYDROXYBUTYRIC ACID  GC/CHLAMYDIA PROBE AMP ([place]) NOT AT [facility]    EKG None  Radiology CT ABDOMEN PELVIS W CONTRAST  Result Date: 09/17/2020 CLINICAL DATA:  Right lower quadrant abdominal pain. EXAM: CT ABDOMEN AND PELVIS WITH CONTRAST TECHNIQUE: Multidetector CT imaging of the abdomen and pelvis was performed using the standard protocol following bolus administration of intravenous contrast.  CONTRAST:  OMNIPAQUE IOHEXOL 300 MG/ML  SOLN  COMPARISON:  CT stone study 03/03/2019. FINDINGS: Lower chest: Chest unremarkable. Hepatobiliary: The liver shows diffusely decreased attenuation suggesting fat deposition. No suspicious focal abnormality within the liver parenchyma. Nonvisualization of the gallbladder suggest cholecystectomy. No intrahepatic or extrahepatic biliary dilation. Pancreas: No focal mass lesion. No dilatation of the main duct. No intraparenchymal cyst. No peripancreatic edema. Spleen: No splenomegaly. No focal mass lesion. Adrenals/Urinary Tract: No adrenal nodule or mass. Kidneys unremarkable. No evidence for hydroureter. The urinary bladder appears normal for the degree of distention. Stomach/Bowel: Stomach is unremarkable. No gastric wall thickening. No evidence of outlet obstruction. Duodenum is normally positioned as is the ligament of Treitz. No small bowel wall thickening. No small bowel dilatation. The terminal ileum is normal. Nonvisualization of the appendix is consistent with the reported history of appendectomy. Staple line visualized at the cecal tip. No gross colonic mass. No colonic wall thickening. Vascular/Lymphatic: No abdominal aortic aneurysm. No abdominal lymphadenopathy. No pelvic sidewall lymphadenopathy. Reproductive: The uterus is unremarkable.  There is no adnexal mass. Other: No intraperitoneal free fluid. Musculoskeletal: No worrisome lytic or sclerotic osseous abnormality. Bilateral pars interarticularis defects noted at L5. IMPRESSION: 1. No acute findings in the abdomen or pelvis. No findings to explain the patient's history of right lower quadrant pain. 2. Previous cholecystectomy and appendectomy. Electronically Signed   By: Kennith Center M.D.   On: 09/17/2020 13:49    Procedures Procedures   Medications Ordered in ED Medications  fluconazole (DIFLUCAN) tablet 200 mg (200 mg Oral Not Given 09/17/20 1329)  sodium chloride 0.9 % bolus 1,000 mL (0 mLs  Intravenous Stopped 09/17/20 1252)  iohexol (OMNIPAQUE) 300 MG/ML solution 100 mL (100 mLs Intravenous Contrast Given 09/17/20 1313)    ED Course  I have reviewed the triage vital signs and the nursing notes.  Pertinent labs & imaging results that were available during my care of the patient were reviewed by me and considered in my medical decision making (see chart for details).  This patient presents to the Emergency Department with complaint of abdominal pain. This involves an extensive number of treatment options, and is a complaint that carries with it a high risk of complications and morbidity.  The differential diagnosis includes, but is not limited to, pregnancy complication vs gastritis vs constipation vs colitis vs UTI vs other  Possible DKA or diabetic ketosis with her untreated diabetes and excessive thirst  I ordered, reviewed, and interpreted labs, including BMP and CBC.  There were no immediate, life-threatening emergencies found in this labwork.  Patient's UA showed glucose and protein, no sign of infection.  Wet prep with yeast. I ordered imaging studies which included CT abdomen pelvis with contrast I independently visualized and interpreted imaging which showed no acute infectious or inflammatory process and the monitor tracing which showed NSR Previous records obtained and reviewed showing recent hospitalization course  After the interventions stated above, I reevaluated the patient and found that they remained clinically stable.  Both pregnancy tests negative today - I explained she is not pregnant.  Patient is work-up, she does not appear to be in DKA or diabetic ketosis.  No anion gap.  No acidosis.  No ketones in the urine.  She is willing to start now on BP medications and metformin for diabetes.  I explained to her that she does have diabetes from her A1c level, and this does need close attention with her primary care doctor.  She likely will wind up on insulin.  We also  talked about dietary  changes.  Based on the patient's clinical exam, vital signs, risk factors, and ED testing, I felt that the patient's overall risk of life-threatening emergency such as bowel perforation, surgical emergency, or sepsis was quite low.  I suspect this clinical presentation is most consistent with viral illness and yeast infection, but explained to the patient that this evaluation was not a definitive diagnostic workup.  I discussed outpatient follow up with primary care provider, and provided specialist office number on the patient's discharge paper if a referral was deemed necessary.  I discussed return precautions with the patient. I felt the patient was clinically stable for discharge.   Clinical Course as of 09/17/20 1513  Tue Sep 17, 2020  1301 No anion gap.  No evidence of DKA.  Blood sugars are elevated.  Pelvic exam performed with nurse chaperone in the room, showing evidence of a yeast infection.  Will send for wet prep, STI panel.  She has some RLQ discomfort still - will order CT to evaluate for appendicitis.  [MT]    Clinical Course User Index [MT] Terald Sleeper, MD    Final Clinical Impression(s) / ED Diagnoses Final diagnoses:  Yeast infection  Viral infection  Hypertension, unspecified type    Rx / DC Orders ED Discharge Orders         Ordered    fluconazole (DIFLUCAN) 200 MG tablet   Once        09/17/20 1436    metFORMIN (GLUCOPHAGE) 1000 MG tablet  2 times daily        09/17/20 1436    ibuprofen (ADVIL) 600 MG tablet  Every 6 hours PRN        09/17/20 1436    amLODipine (NORVASC) 5 MG tablet  Daily        09/17/20 1447           Terald Sleeper, MD 09/17/20 1513

## 2020-09-18 LAB — GC/CHLAMYDIA PROBE AMP (~~LOC~~) NOT AT ARMC
Chlamydia: NEGATIVE
Comment: NEGATIVE
Comment: NORMAL
Neisseria Gonorrhea: NEGATIVE

## 2020-09-23 ENCOUNTER — Encounter (HOSPITAL_COMMUNITY): Payer: Self-pay | Admitting: Obstetrics & Gynecology

## 2020-09-23 ENCOUNTER — Other Ambulatory Visit: Payer: Self-pay

## 2020-09-23 ENCOUNTER — Inpatient Hospital Stay (HOSPITAL_COMMUNITY)
Admission: AD | Admit: 2020-09-23 | Discharge: 2020-09-24 | Disposition: A | Discharge status: Home / Self Care | Payer: Medicaid Other | Attending: Emergency Medicine | Admitting: Emergency Medicine

## 2020-09-23 DIAGNOSIS — Z9049 Acquired absence of other specified parts of digestive tract: Secondary | ICD-10-CM | POA: Insufficient documentation

## 2020-09-23 DIAGNOSIS — E119 Type 2 diabetes mellitus without complications: Secondary | ICD-10-CM | POA: Insufficient documentation

## 2020-09-23 DIAGNOSIS — R1013 Epigastric pain: Secondary | ICD-10-CM | POA: Insufficient documentation

## 2020-09-23 DIAGNOSIS — R110 Nausea: Secondary | ICD-10-CM | POA: Insufficient documentation

## 2020-09-23 DIAGNOSIS — R109 Unspecified abdominal pain: Secondary | ICD-10-CM | POA: Diagnosis present

## 2020-09-23 DIAGNOSIS — R1084 Generalized abdominal pain: Secondary | ICD-10-CM | POA: Diagnosis not present

## 2020-09-23 DIAGNOSIS — O039 Complete or unspecified spontaneous abortion without complication: Secondary | ICD-10-CM | POA: Diagnosis not present

## 2020-09-23 DIAGNOSIS — R0689 Other abnormalities of breathing: Secondary | ICD-10-CM | POA: Diagnosis not present

## 2020-09-23 LAB — CBC WITH DIFFERENTIAL/PLATELET
Abs Immature Granulocytes: 0.1 10*3/uL — ABNORMAL HIGH (ref 0.00–0.07)
Basophils Absolute: 0 10*3/uL (ref 0.0–0.1)
Basophils Relative: 0 %
Eosinophils Absolute: 0.1 10*3/uL (ref 0.0–0.5)
Eosinophils Relative: 0 %
HCT: 39.3 % (ref 36.0–46.0)
Hemoglobin: 11.8 g/dL — ABNORMAL LOW (ref 12.0–15.0)
Immature Granulocytes: 1 %
Lymphocytes Relative: 12 %
Lymphs Abs: 1.4 10*3/uL (ref 0.7–4.0)
MCH: 27.4 pg (ref 26.0–34.0)
MCHC: 30 g/dL (ref 30.0–36.0)
MCV: 91.2 fL (ref 80.0–100.0)
Monocytes Absolute: 0.9 10*3/uL (ref 0.1–1.0)
Monocytes Relative: 8 %
Neutro Abs: 9.1 10*3/uL — ABNORMAL HIGH (ref 1.7–7.7)
Neutrophils Relative %: 79 %
Platelets: 257 10*3/uL (ref 150–400)
RBC: 4.31 MIL/uL (ref 3.87–5.11)
RDW: 15.2 % (ref 11.5–15.5)
WBC: 11.6 10*3/uL — ABNORMAL HIGH (ref 4.0–10.5)
nRBC: 0 % (ref 0.0–0.2)

## 2020-09-23 LAB — COMPREHENSIVE METABOLIC PANEL
ALT: 46 U/L — ABNORMAL HIGH (ref 0–44)
AST: 47 U/L — ABNORMAL HIGH (ref 15–41)
Albumin: 3.1 g/dL — ABNORMAL LOW (ref 3.5–5.0)
Alkaline Phosphatase: 69 U/L (ref 38–126)
Anion gap: 8 (ref 5–15)
BUN: 5 mg/dL — ABNORMAL LOW (ref 6–20)
CO2: 28 mmol/L (ref 22–32)
Calcium: 8.9 mg/dL (ref 8.9–10.3)
Chloride: 100 mmol/L (ref 98–111)
Creatinine, Ser: 0.57 mg/dL (ref 0.44–1.00)
GFR, Estimated: >60 mL/min (ref >60)
Glucose, Bld: 198 mg/dL — ABNORMAL HIGH (ref 70–99)
Potassium: 3.7 mmol/L (ref 3.5–5.1)
Sodium: 136 mmol/L (ref 135–145)
Total Bilirubin: 0.6 mg/dL (ref 0.3–1.2)
Total Protein: 7.1 g/dL (ref 6.5–8.1)

## 2020-09-23 LAB — LIPASE, BLOOD: Lipase: 31 U/L (ref 11–51)

## 2020-09-23 LAB — POCT PREGNANCY, URINE: Preg Test, Ur: NEGATIVE

## 2020-09-23 MED ORDER — ALUM & MAG HYDROXIDE-SIMETH 200-200-20 MG/5ML PO SUSP
30.0000 mL | Freq: Once | ORAL | Status: AC
  Administered 2020-09-24: 30 mL via ORAL
  Filled 2020-09-23: qty 30

## 2020-09-23 MED ORDER — ONDANSETRON 4 MG PO TBDP
4.0000 mg | ORAL_TABLET | Freq: Once | ORAL | Status: AC
  Administered 2020-09-24: 4 mg via ORAL
  Filled 2020-09-23: qty 1

## 2020-09-23 NOTE — ED Triage Notes (Signed)
Pt coming from home. Pt c/o left lower quadrant pain radiating into upper abdomen and back that started today. Pt c/o cramping, nausea. Pt denies diarrhea or vomiting.

## 2020-09-23 NOTE — MAU Note (Signed)
Pt reports lower abd pain since 2 pm, s/p TAB last 04/05. Denies fever. Denies vomiting but reports nausea. Reports vaginal bleeding.

## 2020-09-23 NOTE — MAU Provider Note (Signed)
  S Ms. Yvette Gibbs is a 30 y.o. No obstetric history on file. patient who presents to MAU today with complaint of abdominal pain.   O BP (!) 154/85 (BP Location: Right Arm)   Pulse 99   Temp 99.2 F (37.3 C) (Oral)   Resp 18   SpO2 99%  Physical Exam Vitals reviewed.  Constitutional:      Appearance: She is well-developed.  HENT:     Head: Normocephalic and atraumatic.  Skin:    General: Skin is warm and dry.     Capillary Refill: Capillary refill takes less than 2 seconds.  Neurological:     Mental Status: She is alert.     A Medical screening exam complete Abdominal pain  P HCG test neg. Had serum HCG last week.  Discharge from MAU in stable condition Patient given the option of transfer to [facility] for further evaluation or seek care in outpatient facility of choice - she would like to be seen there. Patient transported. Spoke with Yvette Gibbs.   Levie Heritage, DO 09/23/2020 7:49 PM

## 2020-09-23 NOTE — ED Triage Notes (Signed)
Began atEmergency Medicine Provider Triage Evaluation Note  Tashiana Pucillo , a 30 y.o. female  was evaluated in triage.  Pt complains of abd pain.  Diffuse in nature.  Not associated with food intake.  Began this morning.  Described as cramping in nature.  Denies any diarrhea, vaginal discharge, concerns for any STDs.  Does not take anything for symptoms.  Has a decreased appetite.  Denies any focal right or left lower quadrant abdominal pain  Review of Systems  Positive: abdminal pain Negative: Nausea, vomiting, diarrhea, vaginal bleeding, vaginal discharge  Physical Exam  BP (!) 154/85 (BP Location: Right Arm)   Pulse 99   Temp 99.2 F (37.3 C) (Oral)   Resp 18   SpO2 99%  Gen:   Awake, Obese HEENT:  Atraumatic  Resp:  Normal effort  Cardiac:  Normal rate  Abd:   Nondistended, difficult exam due to patient body habitus MSK:   Moves extremities without difficulty  Neuro:  Speech clear   Medical Decision Making  Medically screening exam initiated at 8:38 PM.  Appropriate orders placed.  Avagail Gilreath was informed that the remainder of the evaluation will be completed by another provider, this initial triage assessment does not replace that evaluation, and the importance of remaining in the ED until their evaluation is complete.  Clinical Impression  Abdominal pain   Aljean Horiuchi A, PA-C 09/23/20 2048

## 2020-09-24 MED ORDER — ONDANSETRON 4 MG PO TBDP: 4.0000 mg | ORAL_TABLET | Freq: Three times a day (TID) | ORAL | 0 refills | Status: DC | PRN

## 2020-09-24 MED ORDER — OMEPRAZOLE 20 MG PO CPDR: 20.0000 mg | DELAYED_RELEASE_CAPSULE | Freq: Every day | ORAL | 1 refills | Status: DC

## 2020-09-24 MED ORDER — SUCRALFATE 1 G PO TABS: 1.0000 g | ORAL_TABLET | Freq: Four times a day (QID) | ORAL | 0 refills | Status: DC | PRN

## 2020-09-24 NOTE — Discharge Instructions (Addendum)
You were evaluated in the Emergency Department and after careful evaluation, we did not find any emergent condition requiring admission or further testing in the hospital.  Your exam/testing today was overall reassuring.  Symptoms seem to be due to inflammation of the lining of the stomach.  Please take the omeprazole medication daily to prevent pain.  You can also use the Carafate medication up to 4 times daily for discomfort.  You can also use the Zofran medication for nausea.  Please return to the Emergency Department if you experience any worsening of your condition.  Thank you for allowing [place] to be a part of your care.

## 2020-09-24 NOTE — ED Notes (Signed)
Pt discharged and wheeled out of the ED in a wheel chair without difficulty. 

## 2020-09-24 NOTE — ED Provider Notes (Signed)
[facility]PT [facility] Emergency Department Provider Note MRN:  [id number]  Arrival date & time: 09/24/20     Chief Complaint   Abdominal Pain   History of Present Illness   Yvette Gibbs is a 30 y.o. year-old female with a history of diabetes presenting to the ED with chief complaint of abdominal pain.  Location: Epigastrium Duration: Several hours today Onset: Gradual Timing: Constant Description: Sharp Severity: Mild to moderate Exacerbating/Alleviating Factors: None Associated Symptoms: Mild nausea Pertinent Negatives: Denies vomiting, no fever, no constipation, no diarrhea, no chest pain or shortness of breath, no vaginal bleeding or discharge.   Review of Systems  A complete 10 system review of systems was obtained and all systems are negative except as noted in the HPI and PMH.   Patient's Health History    Past Medical History:  Diagnosis Date  . Asthma   . Depression   . Diabetes mellitus type 2, uncontrolled (HCC)   . Extreme obesity   . Family history of adverse reaction to anesthesia    " my grandmother had an allergic reaction  . Hypertension   . Mental disorder   . Obesity   . OSA (obstructive sleep apnea)     Past Surgical History:  Procedure Laterality Date  . APPENDECTOMY    . CHOLECYSTECTOMY N/A 09/05/2014   Procedure: LAPAROSCOPIC CHOLECYSTECTOMY;  Surgeon: Axel Filler, MD;  Location: [facility] OR;  Service: General;  Laterality: N/A;  . TONSILLECTOMY      Family History  Problem Relation Age of Onset  . Diabetes Mellitus II Maternal Grandmother     Social History   Socioeconomic History  . Marital status: Single    Spouse name: Not on file  . Number of children: Not on file  . Years of education: Not on file  . Highest education level: Not on file  Occupational History  . Not on file  Tobacco Use  . Smoking status: Never Smoker  . Smokeless tobacco: Never Used  Vaping Use  . Vaping Use: Never used  Substance and  Sexual Activity  . Alcohol use: Yes  . Drug use: Yes    Types: Marijuana  . Sexual activity: Yes    Birth control/protection: None  Other Topics Concern  . Not on file  Social History Narrative  . Not on file   Social Determinants of Health   Financial Resource Strain: Not on file  Food Insecurity: Not on file  Transportation Needs: Not on file  Physical Activity: Not on file  Stress: Not on file  Social Connections: Not on file  Intimate Partner Violence: Not on file     Physical Exam   Vitals:   09/23/20 2048 09/24/20 0015  BP: (!) 170/97 (!) 146/96  Pulse: (!) 103 (!) 104  Resp: 20 18  Temp: 98.7 F (37.1 C)   SpO2: 100% 99%    CONSTITUTIONAL: Well-appearing, NAD, obese NEURO:  Alert and oriented x 3, no focal deficits EYES:  eyes equal and reactive ENT/NECK:  no LAD, no JVD CARDIO: Regular rate, well-perfused, normal S1 and S2 PULM:  CTAB no wheezing or rhonchi GI/GU:  normal bowel sounds, non-distended, non-tender MSK/SPINE:  No gross deformities, no edema SKIN:  no rash, atraumatic PSYCH:  Appropriate speech and behavior  *Additional and/or pertinent findings included in MDM below  Diagnostic and Interventional Summary    EKG Interpretation  Date/Time:    Ventricular Rate:    PR Interval:    QRS Duration:   QT Interval:  QTC Calculation:   R Axis:     Text Interpretation:        Labs Reviewed  CBC WITH DIFFERENTIAL/PLATELET - Abnormal; Notable for the following components:      Result Value   WBC 11.6 (*)    Hemoglobin 11.8 (*)    Neutro Abs 9.1 (*)    Abs Immature Granulocytes 0.10 (*)    All other components within normal limits  COMPREHENSIVE METABOLIC PANEL - Abnormal; Notable for the following components:   Glucose, Bld 198 (*)    BUN 5 (*)    Albumin 3.1 (*)    AST 47 (*)    ALT 46 (*)    All other components within normal limits  LIPASE, BLOOD  URINALYSIS, ROUTINE W REFLEX MICROSCOPIC  POCT PREGNANCY, URINE    No orders to  display    Medications  alum & mag hydroxide-simeth (MAALOX/MYLANTA) 200-200-20 MG/5ML suspension 30 mL (30 mLs Oral Given 09/24/20 0008)  ondansetron (ZOFRAN-ODT) disintegrating tablet 4 mg (4 mg Oral Given 09/24/20 0008)     Procedures  /  Critical Care Procedures  ED Course and Medical Decision Making  I have reviewed the triage vital signs, the nursing notes, and pertinent available records from the EMR.  Listed above are laboratory and imaging tests that I personally ordered, reviewed, and interpreted and then considered in my medical decision making (see below for details).  Suspect gastritis or GERD.  Abdomen is soft and nontender, vital signs are reassuring.  History of cholelithiasis status post cholecystectomy.  Labs thus far reassuring, will trial GI cocktail and Zofran and reassess.  Currently without indication for CT imaging.  Card in the chart review, patient is prescribed PPI but is not currently taking it     Pain improved after GI cocktail, appropriate for discharge.  Elmer Sow. Pilar Plate, MD [facility] Emergency Medicine [facility] [email or web address]  Final Clinical Impressions(s) / ED Diagnoses     ICD-10-CM   1. Epigastric pain  R10.13     ED Discharge Orders         Ordered    omeprazole (PRILOSEC) 20 MG capsule  Daily        09/24/20 0024    sucralfate (CARAFATE) 1 g tablet  4 times daily PRN        09/24/20 0024    ondansetron (ZOFRAN ODT) 4 MG disintegrating tablet  Every 8 hours PRN        09/24/20 0024           Discharge Instructions Discussed with and Provided to Patient:     Discharge Instructions     You were evaluated in the Emergency Department and after careful evaluation, we did not find any emergent condition requiring admission or further testing in the hospital.  Your exam/testing today was overall reassuring.  Symptoms seem to be due to inflammation of the lining of the stomach.  Please take the omeprazole  medication daily to prevent pain.  You can also use the Carafate medication up to 4 times daily for discomfort.  You can also use the Zofran medication for nausea.  Please return to the Emergency Department if you experience any worsening of your condition.  Thank you for allowing [place] to be a part of your care.        Sabas Sous, MD 09/24/20 [phone]

## 2020-09-26 ENCOUNTER — Emergency Department (HOSPITAL_COMMUNITY)
Admission: EM | Admit: 2020-09-26 | Discharge: 2020-09-26 | Disposition: A | Discharge status: Home / Self Care | Payer: Medicaid Other | Attending: Emergency Medicine | Admitting: Emergency Medicine

## 2020-09-26 DIAGNOSIS — Z743 Need for continuous supervision: Secondary | ICD-10-CM | POA: Diagnosis not present

## 2020-09-26 DIAGNOSIS — J45909 Unspecified asthma, uncomplicated: Secondary | ICD-10-CM | POA: Insufficient documentation

## 2020-09-26 DIAGNOSIS — Z833 Family history of diabetes mellitus: Secondary | ICD-10-CM | POA: Diagnosis not present

## 2020-09-26 DIAGNOSIS — I10 Essential (primary) hypertension: Secondary | ICD-10-CM | POA: Diagnosis not present

## 2020-09-26 DIAGNOSIS — R109 Unspecified abdominal pain: Secondary | ICD-10-CM | POA: Diagnosis not present

## 2020-09-26 DIAGNOSIS — E119 Type 2 diabetes mellitus without complications: Secondary | ICD-10-CM | POA: Diagnosis not present

## 2020-09-26 DIAGNOSIS — R000 Tachycardia, unspecified: Secondary | ICD-10-CM | POA: Insufficient documentation

## 2020-09-26 DIAGNOSIS — Z888 Allergy status to other drugs, medicaments and biological substances status: Secondary | ICD-10-CM | POA: Insufficient documentation

## 2020-09-26 DIAGNOSIS — E1165 Type 2 diabetes mellitus with hyperglycemia: Secondary | ICD-10-CM | POA: Diagnosis not present

## 2020-09-26 DIAGNOSIS — Z79899 Other long term (current) drug therapy: Secondary | ICD-10-CM | POA: Insufficient documentation

## 2020-09-26 DIAGNOSIS — I4510 Unspecified right bundle-branch block: Secondary | ICD-10-CM | POA: Insufficient documentation

## 2020-09-26 DIAGNOSIS — R0902 Hypoxemia: Secondary | ICD-10-CM | POA: Diagnosis not present

## 2020-09-26 DIAGNOSIS — K219 Gastro-esophageal reflux disease without esophagitis: Secondary | ICD-10-CM | POA: Insufficient documentation

## 2020-09-26 DIAGNOSIS — Z7984 Long term (current) use of oral hypoglycemic drugs: Secondary | ICD-10-CM | POA: Diagnosis not present

## 2020-09-26 DIAGNOSIS — R1013 Epigastric pain: Secondary | ICD-10-CM | POA: Insufficient documentation

## 2020-09-26 DIAGNOSIS — R739 Hyperglycemia, unspecified: Secondary | ICD-10-CM

## 2020-09-26 DIAGNOSIS — Z9049 Acquired absence of other specified parts of digestive tract: Secondary | ICD-10-CM | POA: Insufficient documentation

## 2020-09-26 DIAGNOSIS — Z8616 Personal history of COVID-19: Secondary | ICD-10-CM | POA: Insufficient documentation

## 2020-09-26 LAB — COMPREHENSIVE METABOLIC PANEL
ALT: 43 U/L (ref 0–44)
AST: 39 U/L (ref 15–41)
Albumin: 3.3 g/dL — ABNORMAL LOW (ref 3.5–5.0)
Alkaline Phosphatase: 67 U/L (ref 38–126)
Anion gap: 7 (ref 5–15)
BUN: 6 mg/dL (ref 6–20)
CO2: 29 mmol/L (ref 22–32)
Calcium: 8.9 mg/dL (ref 8.9–10.3)
Chloride: 100 mmol/L (ref 98–111)
Creatinine, Ser: 0.77 mg/dL (ref 0.44–1.00)
GFR, Estimated: >60 mL/min (ref >60)
Glucose, Bld: 281 mg/dL — ABNORMAL HIGH (ref 70–99)
Potassium: 4.3 mmol/L (ref 3.5–5.1)
Sodium: 136 mmol/L (ref 135–145)
Total Bilirubin: 0.6 mg/dL (ref 0.3–1.2)
Total Protein: 8.2 g/dL — ABNORMAL HIGH (ref 6.5–8.1)

## 2020-09-26 LAB — CBC
HCT: 39.3 % (ref 36.0–46.0)
Hemoglobin: 11.2 g/dL — ABNORMAL LOW (ref 12.0–15.0)
MCH: 26.5 pg (ref 26.0–34.0)
MCHC: 28.5 g/dL — ABNORMAL LOW (ref 30.0–36.0)
MCV: 93.1 fL (ref 80.0–100.0)
Platelets: 242 10*3/uL (ref 150–400)
RBC: 4.22 MIL/uL (ref 3.87–5.11)
RDW: 15.5 % (ref 11.5–15.5)
WBC: 6.9 10*3/uL (ref 4.0–10.5)
nRBC: 0 % (ref 0.0–0.2)

## 2020-09-26 LAB — URINALYSIS, ROUTINE W REFLEX MICROSCOPIC
Bilirubin Urine: NEGATIVE
Glucose, UA: >=500 mg/dL — AB
Hgb urine dipstick: NEGATIVE
Ketones, ur: 5 mg/dL — AB
Leukocytes,Ua: NEGATIVE
Nitrite: NEGATIVE
Protein, ur: >=300 mg/dL — AB
Specific Gravity, Urine: 1.032 — ABNORMAL HIGH (ref 1.005–1.030)
pH: 5 (ref 5.0–8.0)

## 2020-09-26 LAB — I-STAT BETA HCG BLOOD, ED (MC, WL, AP ONLY): I-stat hCG, quantitative: 5.2 m[IU]/mL — ABNORMAL HIGH (ref <5)

## 2020-09-26 LAB — LIPASE, BLOOD: Lipase: 33 U/L (ref 11–51)

## 2020-09-26 MED ORDER — ACETAMINOPHEN 500 MG PO TABS
1000.0000 mg | ORAL_TABLET | Freq: Once | ORAL | Status: AC
  Administered 2020-09-26: 1000 mg via ORAL
  Filled 2020-09-26: qty 2

## 2020-09-26 MED ORDER — INSULIN ASPART 100 UNIT/ML ~~LOC~~ SOLN
6.0000 [IU] | Freq: Once | SUBCUTANEOUS | Status: AC
  Administered 2020-09-26: 6 [IU] via SUBCUTANEOUS

## 2020-09-26 MED ORDER — FAMOTIDINE 20 MG PO TABS
20.0000 mg | ORAL_TABLET | Freq: Once | ORAL | Status: AC
  Administered 2020-09-26: 20 mg via ORAL
  Filled 2020-09-26: qty 1

## 2020-09-26 MED ORDER — AMLODIPINE BESYLATE 5 MG PO TABS
5.0000 mg | ORAL_TABLET | Freq: Once | ORAL | Status: AC
  Administered 2020-09-26: 5 mg via ORAL
  Filled 2020-09-26: qty 1

## 2020-09-26 MED ORDER — ALUM & MAG HYDROXIDE-SIMETH 200-200-20 MG/5ML PO SUSP
30.0000 mL | Freq: Once | ORAL | Status: AC
  Administered 2020-09-26: 30 mL via ORAL
  Filled 2020-09-26: qty 30

## 2020-09-26 NOTE — ED Notes (Signed)
Patient verbalizes understanding of discharge instructions. Opportunity for questioning and answers were provided. Armband removed by staff, pt discharged from ED via wheelchair.  

## 2020-09-26 NOTE — ED Triage Notes (Signed)
Pt arrived by EMS from home c/o headache and abdominal pain.  Pt was seen 4/11 for similar complaints without relief  Pt was concerned she was having a miscarriage earlier this month and [facility], no longer bleeding but have same amount of pain.   RA sats on arrival mid 80s% placed on 2L [place] in triage

## 2020-09-26 NOTE — ED Triage Notes (Signed)
Emergency Medicine Provider Triage Evaluation Note  Yvette Gibbs , a 30 y.o. female  was evaluated in triage.  Pt complains of domino pain, headache.  Review of Systems  Positive: Abdominal, headache, nausea, vomitting, chills  Negative: Fevers, vaginal bleeding   Physical Exam  BP (!) 158/105 (BP Location: Right Arm)   Pulse (!) 115   Temp 99.9 F (37.7 C)   Resp 20   SpO2 95%  Gen:   Awake, no distress   HEENT:  Atraumatic  Resp:  Normal effort  Cardiac:  Normal rate  Abd:   Mild generalized tenderness MSK:   Moves extremities without difficulty  Neuro:  Speech clear   Medical Decision Making  Medically screening exam initiated at 12:54 PM.  Appropriate orders placed.  Yvette Gibbs was informed that the remainder of the evaluation will be completed by another provider, this initial triage assessment does not replace that evaluation, and the importance of remaining in the ED until their evaluation is complete.  Clinical Impression  30 year old female here with headache and abdominal pain.  Seen here couple days ago for the same.  No changes in her symptoms.  Lab work ordered, stable at this time.   Mare Ferrari, PA-C 09/26/20 1256

## 2020-09-26 NOTE — Discharge Instructions (Addendum)
It was our pleasure to provide your ER care today - we hope that you feel better.  Take your prilosec (acid blocker medication). You may also try pepcid or maalox for symptom relief.   Your blood sugar is high - continue your diabetes meds, drink plenty of water/stay well hydrated, check and record your sugars 4x/day, and follow up with primary care doctor in the next 3-4 days.   Your blood pressure is also high - continue your meds, limit salt intake and eat heart healthy eating plan, and follow up with primary care doctor in the next 3-4 days.   Return to ER if worse, new symptoms, fevers, new or severe pain, worsening/severe abdominal pain, persistent vomiting, chest pain, trouble breathing, or other concern.

## 2020-09-26 NOTE — ED Provider Notes (Addendum)
[facility] EMERGENCY DEPARTMENT Provider Note   CSN: [id number] Arrival date & time: 09/26/20  1225     History Chief Complaint  Patient presents with  . Abdominal Pain    Yvette Gibbs is a 30 y.o. female.  Patient c/o epigastric pain in the past couple days. Symptoms acute onset, mild-mod, constant, persistent, dull, non radiating. No associated vomiting. No diarrhea. No abd distension. Notes hx gerd, and occasional heartburn. No exertional cp or discomfort. No pleuritic pain. No cough or uri symptoms, no fever or chills. No lower abdomen or pelvic pain. No dysuria or hematuria. No leg pain or swelling.   The history is provided by the patient.  Abdominal Pain Associated symptoms: no chest pain, no chills, no cough, no diarrhea, no dysuria, no fever, no shortness of breath, no sore throat and no vomiting        Past Medical History:  Diagnosis Date  . Asthma   . Depression   . Diabetes mellitus type 2, uncontrolled (HCC)   . Extreme obesity   . Family history of adverse reaction to anesthesia    " my grandmother had an allergic reaction  . Hypertension   . Mental disorder   . Obesity   . OSA (obstructive sleep apnea)     Patient Active Problem List   Diagnosis Date Noted  . Acute respiratory failure due to COVID-19 (HCC) 05/21/2020  . Gastroesophageal reflux disease without esophagitis 06/19/2019  . Loud snoring 06/19/2019  . Type 2 diabetes mellitus without complication, without long-term current use of insulin (HCC) 03/04/2019  . Abdominal pain 03/03/2019  . HTN (hypertension) 03/03/2019  . Hyperglycemia 03/03/2019  . Proteinuria 03/03/2019  . HCAP (healthcare-associated pneumonia) 09/07/2015  . Severe sepsis (HCC) 09/07/2015  . Asthma 09/07/2015  . Pleuritic chest pain 09/01/2015  . Morbid obesity (HCC) 09/01/2015  . OSA (obstructive sleep apnea) 09/01/2015  . Influenza A 09/01/2015  . Biliary colic 09/04/2014    Past Surgical  History:  Procedure Laterality Date  . APPENDECTOMY    . CHOLECYSTECTOMY N/A 09/05/2014   Procedure: LAPAROSCOPIC CHOLECYSTECTOMY;  Surgeon: Axel Filler, MD;  Location: [facility] OR;  Service: General;  Laterality: N/A;  . TONSILLECTOMY       OB History   No obstetric history on file.     Family History  Problem Relation Age of Onset  . Diabetes Mellitus II Maternal Grandmother     Social History   Tobacco Use  . Smoking status: Never Smoker  . Smokeless tobacco: Never Used  Vaping Use  . Vaping Use: Never used  Substance Use Topics  . Alcohol use: Yes  . Drug use: Yes    Types: Marijuana    Home Medications Prior to Admission medications   Medication Sig Start Date End Date Taking? Authorizing Provider  acetaminophen (TYLENOL) 500 MG tablet Take 1,000 mg by mouth every 6 (six) hours as needed for mild pain or fever.    [provider]  albuterol (VENTOLIN HFA) 108 (90 Base) MCG/ACT inhaler Inhale 1-2 puffs into the lungs every 6 (six) hours as needed for wheezing or shortness of breath. 03/21/19   Marcine Matar, MD  amLODipine (NORVASC) 5 MG tablet Take 1 tablet (5 mg total) by mouth daily. Patient not taking: No sig reported 05/25/20   Leatha Gilding, MD  amLODipine (NORVASC) 5 MG tablet Take 1 tablet (5 mg total) by mouth daily for 30 doses. 09/17/20 10/17/20  Terald Sleeper, MD  guaiFENesin-dextromethorphan [facility]  DM) 100-10 MG/5ML syrup Take 5 mLs by mouth every 4 (four) hours as needed for cough. 03/04/19   Dhungel, Theda Belfast, MD  ibuprofen (ADVIL) 600 MG tablet Take 1 tablet (600 mg total) by mouth every 6 (six) hours as needed for up to 20 doses for fever, mild pain or moderate pain. 09/17/20   Terald Sleeper, MD  loratadine (CLARITIN) 10 MG tablet Take 10 mg by mouth daily as needed for allergies.    [provider]  metFORMIN (GLUCOPHAGE) 1000 MG tablet Take 1 tablet (1,000 mg total) by mouth 2 (two) times daily. 09/17/20 10/17/20  Terald Sleeper, MD  metFORMIN (GLUCOPHAGE) 500 MG tablet Take 1 tablet (500 mg total) by mouth 2 (two) times daily with a meal. Patient not taking: No sig reported 05/25/20   Leatha Gilding, MD  omeprazole (PRILOSEC) 20 MG capsule Take 1 capsule (20 mg total) by mouth daily. 09/24/20   Sabas Sous, MD  ondansetron (ZOFRAN ODT) 4 MG disintegrating tablet Take 1 tablet (4 mg total) by mouth every 8 (eight) hours as needed for nausea or vomiting. 09/24/20   Sabas Sous, MD  PRESCRIPTION MEDICATION Inhale 2 puffs into the lungs every 4 (four) hours as needed (wheezing &shortnes of breath). Patient not sure about the name    [provider]  sucralfate (CARAFATE) 1 g tablet Take 1 tablet (1 g total) by mouth 4 (four) times daily as needed. 09/24/20   Sabas Sous, MD    Allergies    Lisinopril and Fish allergy  Review of Systems   Review of Systems  Constitutional: Negative for chills and fever.  HENT: Negative for sore throat.   Eyes: Negative for redness.  Respiratory: Negative for cough and shortness of breath.   Cardiovascular: Negative for chest pain, palpitations and leg swelling.  Gastrointestinal: Positive for abdominal pain. Negative for diarrhea and vomiting.  Genitourinary: Negative for dysuria and flank pain.  Musculoskeletal: Negative for back pain and neck pain.  Skin: Negative for rash.  Neurological: Negative for headaches.  Hematological: Does not bruise/bleed easily.  Psychiatric/Behavioral: Negative for confusion.    Physical Exam Updated Vital Signs BP 127/64 (BP Location: Right Wrist)   Pulse 98   Temp 99.1 F (37.3 C) (Oral)   Resp 20   SpO2 99%   Physical Exam Vitals and nursing note reviewed.  Constitutional:      Appearance: Normal appearance. She is well-developed.  HENT:     Head: Atraumatic.     Nose: Nose normal.     Mouth/Throat:     Mouth: Mucous membranes are moist.  Eyes:     General: No scleral icterus.    Conjunctiva/sclera:  Conjunctivae normal.  Neck:     Trachea: No tracheal deviation.  Cardiovascular:     Rate and Rhythm: Normal rate and regular rhythm.     Pulses: Normal pulses.     Heart sounds: Normal heart sounds. No murmur heard. No friction rub. No gallop.   Pulmonary:     Effort: Pulmonary effort is normal. No respiratory distress.     Breath sounds: Normal breath sounds.  Abdominal:     General: Bowel sounds are normal. There is no distension.     Palpations: Abdomen is soft. There is no mass.     Tenderness: There is no abdominal tenderness. There is no guarding or rebound.     Hernia: No hernia is present.     Comments: Obese.   Genitourinary:  Comments: No cva tenderness.  Musculoskeletal:        General: No tenderness.     Cervical back: Normal range of motion and neck supple. No rigidity. No muscular tenderness.     Right lower leg: No edema.     Left lower leg: No edema.  Skin:    General: Skin is warm and dry.     Findings: No rash.  Neurological:     Mental Status: She is alert.     Comments: Alert, speech normal.   Psychiatric:        Mood and Affect: Mood normal.     ED Results / Procedures / Treatments   Labs (all labs ordered are listed, but only abnormal results are displayed) Results for orders placed or performed during the hospital encounter of 09/26/20  Lipase, blood  Result Value Ref Range   Lipase 33 11 - 51 U/L  Comprehensive metabolic panel  Result Value Ref Range   Sodium 136 135 - 145 mmol/L   Potassium 4.3 3.5 - 5.1 mmol/L   Chloride 100 98 - 111 mmol/L   CO2 29 22 - 32 mmol/L   Glucose, Bld 281 (H) 70 - 99 mg/dL   BUN 6 6 - 20 mg/dL   Creatinine, Ser 3.15 0.44 - 1.00 mg/dL   Calcium 8.9 8.9 - 40.0 mg/dL   Total Protein 8.2 (H) 6.5 - 8.1 g/dL   Albumin 3.3 (L) 3.5 - 5.0 g/dL   AST 39 15 - 41 U/L   ALT 43 0 - 44 U/L   Alkaline Phosphatase 67 38 - 126 U/L   Total Bilirubin 0.6 0.3 - 1.2 mg/dL   GFR, Estimated >86 >76 mL/min   Anion gap 7 5 - 15   CBC  Result Value Ref Range   WBC 6.9 4.0 - 10.5 K/uL   RBC 4.22 3.87 - 5.11 MIL/uL   Hemoglobin 11.2 (L) 12.0 - 15.0 g/dL   HCT 19.5 09.3 - 26.7 %   MCV 93.1 80.0 - 100.0 fL   MCH 26.5 26.0 - 34.0 pg   MCHC 28.5 (L) 30.0 - 36.0 g/dL   RDW 12.4 58.0 - 99.8 %   Platelets 242 150 - 400 K/uL   nRBC 0.0 0.0 - 0.2 %  Urinalysis, Routine w reflex microscopic Urine, Clean Catch  Result Value Ref Range   Color, Urine AMBER (A) YELLOW   APPearance TURBID (A) CLEAR   Specific Gravity, Urine 1.032 (H) 1.005 - 1.030   pH 5.0 5.0 - 8.0   Glucose, UA >=500 (A) NEGATIVE mg/dL   Hgb urine dipstick NEGATIVE NEGATIVE   Bilirubin Urine NEGATIVE NEGATIVE   Ketones, ur 5 (A) NEGATIVE mg/dL   Protein, ur >=338 (A) NEGATIVE mg/dL   Nitrite NEGATIVE NEGATIVE   Leukocytes,Ua NEGATIVE NEGATIVE   Bacteria, UA RARE (A) NONE SEEN   Squamous Epithelial / LPF 0-5 0 - 5   Mucus PRESENT    Amorphous Crystal PRESENT   I-Stat beta hCG blood, ED  Result Value Ref Range   I-stat hCG, quantitative 5.2 (H) <5 mIU/mL   Comment 3           CT ABDOMEN PELVIS W CONTRAST  Result Date: 09/17/2020 CLINICAL DATA:  Right lower quadrant abdominal pain. EXAM: CT ABDOMEN AND PELVIS WITH CONTRAST TECHNIQUE: Multidetector CT imaging of the abdomen and pelvis was performed using the standard protocol following bolus administration of intravenous contrast. CONTRAST:  OMNIPAQUE IOHEXOL 300 MG/ML  SOLN COMPARISON:  CT stone study 03/03/2019. FINDINGS: Lower chest: Chest unremarkable. Hepatobiliary: The liver shows diffusely decreased attenuation suggesting fat deposition. No suspicious focal abnormality within the liver parenchyma. Nonvisualization of the gallbladder suggest cholecystectomy. No intrahepatic or extrahepatic biliary dilation. Pancreas: No focal mass lesion. No dilatation of the main duct. No intraparenchymal cyst. No peripancreatic edema. Spleen: No splenomegaly. No focal mass lesion. Adrenals/Urinary Tract: No  adrenal nodule or mass. Kidneys unremarkable. No evidence for hydroureter. The urinary bladder appears normal for the degree of distention. Stomach/Bowel: Stomach is unremarkable. No gastric wall thickening. No evidence of outlet obstruction. Duodenum is normally positioned as is the ligament of Treitz. No small bowel wall thickening. No small bowel dilatation. The terminal ileum is normal. Nonvisualization of the appendix is consistent with the reported history of appendectomy. Staple line visualized at the cecal tip. No gross colonic mass. No colonic wall thickening. Vascular/Lymphatic: No abdominal aortic aneurysm. No abdominal lymphadenopathy. No pelvic sidewall lymphadenopathy. Reproductive: The uterus is unremarkable.  There is no adnexal mass. Other: No intraperitoneal free fluid. Musculoskeletal: No worrisome lytic or sclerotic osseous abnormality. Bilateral pars interarticularis defects noted at L5. IMPRESSION: 1. No acute findings in the abdomen or pelvis. No findings to explain the patient's history of right lower quadrant pain. 2. Previous cholecystectomy and appendectomy. Electronically Signed   By: Kennith CenterEric  Mansell M.D.   On: 09/17/2020 13:49    EKG EKG Interpretation  Date/Time:  Thursday September 26 2020 12:36:59 EDT Ventricular Rate:  117 PR Interval:  150 QRS Duration: 108 QT Interval:  342 QTC Calculation: 477 R Axis:   0 Text Interpretation: Sinus tachycardia Incomplete right bundle branch block Confirmed by Cathren LaineSteinl, Angeligue Bowne ([id number]) on 09/26/2020 4:38:20 PM   Radiology No results found.  Procedures Procedures   Medications Ordered in ED Medications - No data to display  ED Course  I have reviewed the triage vital signs and the nursing notes.  Pertinent labs & imaging results that were available during my care of the patient were reviewed by me and considered in my medical decision making (see chart for details).    MDM Rules/Calculators/A&P                         Labs  sent.   Reviewed nursing notes and prior charts for additional history.  Recent ct abd/pelvis neg for acute process.   Labs reviewed/intepreted by me - wbc normal, hct normal. Glucose elevated, hco3 normal. novolog sq. Po fluids.   Acetaminophen po, pepcid po, maalox po. Po fluids.  bp is high, hx same, hasnt had her meds yet, amlodipine po. No headache. No cp.   Recheck pt - resting comfortably. No distress. Abd soft nt. No sob or increased wob. Pt currently appears stable for d/c.   Rec pcp f/u.  Return precautions provided.      Final Clinical Impression(s) / ED Diagnoses Final diagnoses:  None    Rx / DC Orders ED Discharge Orders    None            Cathren LaineSteinl, Blenda Wisecup, MD 09/26/20 1924

## 2020-09-28 ENCOUNTER — Inpatient Hospital Stay (HOSPITAL_COMMUNITY): Payer: Medicaid Other

## 2020-09-28 ENCOUNTER — Encounter (HOSPITAL_COMMUNITY): Payer: Self-pay | Admitting: Emergency Medicine

## 2020-09-28 ENCOUNTER — Inpatient Hospital Stay (HOSPITAL_COMMUNITY)
Admission: EM | Admit: 2020-09-28 | Discharge: 2020-10-08 | DRG: 291 | Disposition: A | Discharge status: Home Health | Payer: Medicaid Other | Attending: Internal Medicine | Admitting: Internal Medicine

## 2020-09-28 ENCOUNTER — Emergency Department (HOSPITAL_COMMUNITY): Payer: Medicaid Other

## 2020-09-28 ENCOUNTER — Other Ambulatory Visit: Payer: Self-pay

## 2020-09-28 DIAGNOSIS — Z6841 Body Mass Index (BMI) 40.0 and over, adult: Secondary | ICD-10-CM

## 2020-09-28 DIAGNOSIS — Z8616 Personal history of COVID-19: Secondary | ICD-10-CM | POA: Diagnosis not present

## 2020-09-28 DIAGNOSIS — D649 Anemia, unspecified: Secondary | ICD-10-CM | POA: Diagnosis present

## 2020-09-28 DIAGNOSIS — I517 Cardiomegaly: Secondary | ICD-10-CM | POA: Diagnosis not present

## 2020-09-28 DIAGNOSIS — I110 Hypertensive heart disease with heart failure: Principal | ICD-10-CM

## 2020-09-28 DIAGNOSIS — R1013 Epigastric pain: Secondary | ICD-10-CM | POA: Diagnosis not present

## 2020-09-28 DIAGNOSIS — E1143 Type 2 diabetes mellitus with diabetic autonomic (poly)neuropathy: Secondary | ICD-10-CM | POA: Diagnosis present

## 2020-09-28 DIAGNOSIS — I509 Heart failure, unspecified: Secondary | ICD-10-CM | POA: Diagnosis not present

## 2020-09-28 DIAGNOSIS — R6889 Other general symptoms and signs: Secondary | ICD-10-CM | POA: Diagnosis not present

## 2020-09-28 DIAGNOSIS — Z79899 Other long term (current) drug therapy: Secondary | ICD-10-CM

## 2020-09-28 DIAGNOSIS — I5033 Acute on chronic diastolic (congestive) heart failure: Secondary | ICD-10-CM | POA: Diagnosis present

## 2020-09-28 DIAGNOSIS — G4733 Obstructive sleep apnea (adult) (pediatric): Secondary | ICD-10-CM | POA: Diagnosis not present

## 2020-09-28 DIAGNOSIS — R0602 Shortness of breath: Secondary | ICD-10-CM | POA: Diagnosis not present

## 2020-09-28 DIAGNOSIS — J449 Chronic obstructive pulmonary disease, unspecified: Secondary | ICD-10-CM | POA: Diagnosis present

## 2020-09-28 DIAGNOSIS — R000 Tachycardia, unspecified: Secondary | ICD-10-CM | POA: Diagnosis not present

## 2020-09-28 DIAGNOSIS — E1165 Type 2 diabetes mellitus with hyperglycemia: Secondary | ICD-10-CM | POA: Diagnosis not present

## 2020-09-28 DIAGNOSIS — R0600 Dyspnea, unspecified: Secondary | ICD-10-CM

## 2020-09-28 DIAGNOSIS — F32A Depression, unspecified: Secondary | ICD-10-CM | POA: Diagnosis not present

## 2020-09-28 DIAGNOSIS — J45901 Unspecified asthma with (acute) exacerbation: Secondary | ICD-10-CM | POA: Diagnosis present

## 2020-09-28 DIAGNOSIS — K760 Fatty (change of) liver, not elsewhere classified: Secondary | ICD-10-CM | POA: Diagnosis present

## 2020-09-28 DIAGNOSIS — R0609 Other forms of dyspnea: Secondary | ICD-10-CM | POA: Diagnosis not present

## 2020-09-28 DIAGNOSIS — R109 Unspecified abdominal pain: Secondary | ICD-10-CM | POA: Diagnosis not present

## 2020-09-28 DIAGNOSIS — B373 Candidiasis of vulva and vagina: Secondary | ICD-10-CM | POA: Diagnosis present

## 2020-09-28 DIAGNOSIS — E662 Morbid (severe) obesity with alveolar hypoventilation: Secondary | ICD-10-CM | POA: Diagnosis present

## 2020-09-28 DIAGNOSIS — R1030 Lower abdominal pain, unspecified: Secondary | ICD-10-CM

## 2020-09-28 DIAGNOSIS — J9601 Acute respiratory failure with hypoxia: Secondary | ICD-10-CM | POA: Diagnosis present

## 2020-09-28 DIAGNOSIS — I4510 Unspecified right bundle-branch block: Secondary | ICD-10-CM | POA: Diagnosis present

## 2020-09-28 DIAGNOSIS — K219 Gastro-esophageal reflux disease without esophagitis: Secondary | ICD-10-CM | POA: Diagnosis present

## 2020-09-28 DIAGNOSIS — D6489 Other specified anemias: Secondary | ICD-10-CM | POA: Diagnosis present

## 2020-09-28 DIAGNOSIS — Z833 Family history of diabetes mellitus: Secondary | ICD-10-CM

## 2020-09-28 DIAGNOSIS — K3184 Gastroparesis: Secondary | ICD-10-CM | POA: Diagnosis present

## 2020-09-28 DIAGNOSIS — Z743 Need for continuous supervision: Secondary | ICD-10-CM | POA: Diagnosis not present

## 2020-09-28 DIAGNOSIS — E1121 Type 2 diabetes mellitus with diabetic nephropathy: Secondary | ICD-10-CM | POA: Diagnosis present

## 2020-09-28 DIAGNOSIS — I499 Cardiac arrhythmia, unspecified: Secondary | ICD-10-CM | POA: Diagnosis not present

## 2020-09-28 DIAGNOSIS — R809 Proteinuria, unspecified: Secondary | ICD-10-CM | POA: Diagnosis present

## 2020-09-28 DIAGNOSIS — Z7984 Long term (current) use of oral hypoglycemic drugs: Secondary | ICD-10-CM

## 2020-09-28 DIAGNOSIS — G8929 Other chronic pain: Secondary | ICD-10-CM | POA: Diagnosis present

## 2020-09-28 DIAGNOSIS — I10 Essential (primary) hypertension: Secondary | ICD-10-CM | POA: Diagnosis present

## 2020-09-28 DIAGNOSIS — J9690 Respiratory failure, unspecified, unspecified whether with hypoxia or hypercapnia: Secondary | ICD-10-CM

## 2020-09-28 DIAGNOSIS — R748 Abnormal levels of other serum enzymes: Secondary | ICD-10-CM | POA: Diagnosis not present

## 2020-09-28 DIAGNOSIS — E119 Type 2 diabetes mellitus without complications: Secondary | ICD-10-CM

## 2020-09-28 DIAGNOSIS — Z5329 Procedure and treatment not carried out because of patient's decision for other reasons: Secondary | ICD-10-CM | POA: Diagnosis not present

## 2020-09-28 DIAGNOSIS — I5030 Unspecified diastolic (congestive) heart failure: Secondary | ICD-10-CM

## 2020-09-28 DIAGNOSIS — B379 Candidiasis, unspecified: Secondary | ICD-10-CM | POA: Diagnosis not present

## 2020-09-28 LAB — GLUCOSE, CAPILLARY
Glucose-Capillary: 158 mg/dL — ABNORMAL HIGH (ref 70–99)
Glucose-Capillary: 165 mg/dL — ABNORMAL HIGH (ref 70–99)
Glucose-Capillary: 179 mg/dL — ABNORMAL HIGH (ref 70–99)
Glucose-Capillary: 199 mg/dL — ABNORMAL HIGH (ref 70–99)
Glucose-Capillary: 245 mg/dL — ABNORMAL HIGH (ref 70–99)
Glucose-Capillary: 308 mg/dL — ABNORMAL HIGH (ref 70–99)
Glucose-Capillary: 329 mg/dL — ABNORMAL HIGH (ref 70–99)
Glucose-Capillary: 399 mg/dL — ABNORMAL HIGH (ref 70–99)
Glucose-Capillary: 441 mg/dL — ABNORMAL HIGH (ref 70–99)

## 2020-09-28 LAB — CBC WITH DIFFERENTIAL/PLATELET
Abs Immature Granulocytes: 0.04 10*3/uL (ref 0.00–0.07)
Basophils Absolute: 0 10*3/uL (ref 0.0–0.1)
Basophils Relative: 0 %
Eosinophils Absolute: 0.1 10*3/uL (ref 0.0–0.5)
Eosinophils Relative: 1 %
HCT: 36 % (ref 36.0–46.0)
Hemoglobin: 10.5 g/dL — ABNORMAL LOW (ref 12.0–15.0)
Immature Granulocytes: 1 %
Lymphocytes Relative: 15 %
Lymphs Abs: 1.3 10*3/uL (ref 0.7–4.0)
MCH: 26.9 pg (ref 26.0–34.0)
MCHC: 29.2 g/dL — ABNORMAL LOW (ref 30.0–36.0)
MCV: 92.3 fL (ref 80.0–100.0)
Monocytes Absolute: 1.1 10*3/uL — ABNORMAL HIGH (ref 0.1–1.0)
Monocytes Relative: 13 %
Neutro Abs: 6 10*3/uL (ref 1.7–7.7)
Neutrophils Relative %: 70 %
Platelets: 222 10*3/uL (ref 150–400)
RBC: 3.9 MIL/uL (ref 3.87–5.11)
RDW: 15.6 % — ABNORMAL HIGH (ref 11.5–15.5)
WBC: 8.5 10*3/uL (ref 4.0–10.5)
nRBC: 0 % (ref 0.0–0.2)

## 2020-09-28 LAB — CBG MONITORING, ED: Glucose-Capillary: 323 mg/dL — ABNORMAL HIGH (ref 70–99)

## 2020-09-28 LAB — COMPREHENSIVE METABOLIC PANEL
ALT: 36 U/L (ref 0–44)
AST: 30 U/L (ref 15–41)
Albumin: 3 g/dL — ABNORMAL LOW (ref 3.5–5.0)
Alkaline Phosphatase: 69 U/L (ref 38–126)
Anion gap: 12 (ref 5–15)
BUN: 5 mg/dL — ABNORMAL LOW (ref 6–20)
CO2: 29 mmol/L (ref 22–32)
Calcium: 9 mg/dL (ref 8.9–10.3)
Chloride: 95 mmol/L — ABNORMAL LOW (ref 98–111)
Creatinine, Ser: 0.6 mg/dL (ref 0.44–1.00)
GFR, Estimated: >60 mL/min (ref >60)
Glucose, Bld: 348 mg/dL — ABNORMAL HIGH (ref 70–99)
Potassium: 3.7 mmol/L (ref 3.5–5.1)
Sodium: 136 mmol/L (ref 135–145)
Total Bilirubin: 0.5 mg/dL (ref 0.3–1.2)
Total Protein: 7.2 g/dL (ref 6.5–8.1)

## 2020-09-28 LAB — I-STAT BETA HCG BLOOD, ED (MC, WL, AP ONLY): I-stat hCG, quantitative: <5 m[IU]/mL (ref <5)

## 2020-09-28 LAB — LIPASE, BLOOD
Lipase: 29 U/L (ref 11–51)
Lipase: 25 U/L (ref 11–51)

## 2020-09-28 LAB — RESP PANEL BY RT-PCR (FLU A&B, COVID) ARPGX2
Influenza A by PCR: NEGATIVE
Influenza B by PCR: NEGATIVE
SARS Coronavirus 2 by RT PCR: NEGATIVE

## 2020-09-28 LAB — BRAIN NATRIURETIC PEPTIDE: B Natriuretic Peptide: 44.9 pg/mL (ref 0.0–100.0)

## 2020-09-28 LAB — GLUCOSE, RANDOM: Glucose, Bld: 473 mg/dL — ABNORMAL HIGH (ref 70–99)

## 2020-09-28 MED ORDER — METOCLOPRAMIDE HCL 10 MG PO TABS
5.0000 mg | ORAL_TABLET | Freq: Three times a day (TID) | ORAL | Status: DC
  Administered 2020-09-28 – 2020-10-08 (×28): 5 mg via ORAL
  Filled 2020-09-28 (×26): qty 1

## 2020-09-28 MED ORDER — PERFLUTREN LIPID MICROSPHERE
1.0000 mL | INTRAVENOUS | Status: AC | PRN
  Administered 2020-09-28: 5 mL via INTRAVENOUS
  Filled 2020-09-28: qty 10

## 2020-09-28 MED ORDER — DEXTROSE 50 % IV SOLN: 0.0000 mL | INTRAVENOUS | Status: DC | PRN

## 2020-09-28 MED ORDER — INSULIN ASPART 100 UNIT/ML ~~LOC~~ SOLN
0.0000 [IU] | Freq: Three times a day (TID) | SUBCUTANEOUS | Status: DC
  Administered 2020-09-28: 11 [IU] via SUBCUTANEOUS

## 2020-09-28 MED ORDER — ACETAMINOPHEN 325 MG PO TABS
650.0000 mg | ORAL_TABLET | Freq: Four times a day (QID) | ORAL | Status: DC | PRN
  Administered 2020-09-28 – 2020-10-07 (×19): 650 mg via ORAL
  Filled 2020-09-28 (×20): qty 2

## 2020-09-28 MED ORDER — METHYLPREDNISOLONE SODIUM SUCC 125 MG IJ SOLR
125.0000 mg | Freq: Once | INTRAMUSCULAR | Status: AC
  Administered 2020-09-28: 125 mg via INTRAVENOUS
  Filled 2020-09-28: qty 2

## 2020-09-28 MED ORDER — FUROSEMIDE 10 MG/ML IJ SOLN
40.0000 mg | Freq: Once | INTRAMUSCULAR | Status: AC
  Administered 2020-09-28: 40 mg via INTRAVENOUS
  Filled 2020-09-28: qty 4

## 2020-09-28 MED ORDER — PREDNISONE 20 MG PO TABS
40.0000 mg | ORAL_TABLET | Freq: Every day | ORAL | Status: DC
  Administered 2020-09-28 – 2020-09-29 (×2): 40 mg via ORAL
  Filled 2020-09-28 (×2): qty 2

## 2020-09-28 MED ORDER — LIVING WELL WITH DIABETES BOOK
Freq: Once | Status: AC
  Filled 2020-09-28: qty 1

## 2020-09-28 MED ORDER — LIDOCAINE VISCOUS HCL 2 % MT SOLN
15.0000 mL | Freq: Once | OROMUCOSAL | Status: AC
  Administered 2020-09-28: 15 mL via ORAL
  Filled 2020-09-28: qty 15

## 2020-09-28 MED ORDER — ALUM & MAG HYDROXIDE-SIMETH 200-200-20 MG/5ML PO SUSP
30.0000 mL | Freq: Once | ORAL | Status: AC
  Administered 2020-09-28: 30 mL via ORAL
  Filled 2020-09-28: qty 30

## 2020-09-28 MED ORDER — PANTOPRAZOLE SODIUM 40 MG IV SOLR
40.0000 mg | Freq: Two times a day (BID) | INTRAVENOUS | Status: DC
  Administered 2020-09-28 – 2020-09-29 (×3): 40 mg via INTRAVENOUS
  Filled 2020-09-28 (×3): qty 40

## 2020-09-28 MED ORDER — AMLODIPINE BESYLATE 5 MG PO TABS
5.0000 mg | ORAL_TABLET | Freq: Every day | ORAL | Status: DC
Start: 2020-09-28 — End: 2020-09-29
  Administered 2020-09-28: 5 mg via ORAL
  Filled 2020-09-28 (×2): qty 1

## 2020-09-28 MED ORDER — INSULIN STARTER KIT- PEN NEEDLES (ENGLISH)
1.0000 | Freq: Once | Status: AC
  Administered 2020-09-28: 1
  Filled 2020-09-28: qty 1

## 2020-09-28 MED ORDER — ACETAMINOPHEN 650 MG RE SUPP: 650.0000 mg | Freq: Four times a day (QID) | RECTAL | Status: DC | PRN

## 2020-09-28 MED ORDER — SUCRALFATE 1 GM/10ML PO SUSP
1.0000 g | Freq: Three times a day (TID) | ORAL | Status: DC
  Administered 2020-09-28 – 2020-10-08 (×35): 1 g via ORAL
  Filled 2020-09-28 (×35): qty 10

## 2020-09-28 MED ORDER — IPRATROPIUM-ALBUTEROL 0.5-2.5 (3) MG/3ML IN SOLN
5.0000 mL | Freq: Once | RESPIRATORY_TRACT | Status: AC
  Administered 2020-09-28: 5 mL via RESPIRATORY_TRACT
  Filled 2020-09-28: qty 3

## 2020-09-28 MED ORDER — INSULIN REGULAR(HUMAN) IN NACL 100-0.9 UT/100ML-% IV SOLN
INTRAVENOUS | Status: DC
  Administered 2020-09-28: 18 [IU]/h via INTRAVENOUS
  Administered 2020-09-28: 19 [IU]/h via INTRAVENOUS
  Administered 2020-09-29: 7.5 [IU]/h via INTRAVENOUS
  Filled 2020-09-28 (×3): qty 100

## 2020-09-28 MED ORDER — ALBUTEROL SULFATE (2.5 MG/3ML) 0.083% IN NEBU
2.5000 mg | INHALATION_SOLUTION | Freq: Four times a day (QID) | RESPIRATORY_TRACT | Status: DC
  Administered 2020-09-28 – 2020-09-30 (×7): 2.5 mg via RESPIRATORY_TRACT
  Filled 2020-09-28 (×7): qty 3

## 2020-09-28 MED ORDER — ENOXAPARIN SODIUM 100 MG/ML ~~LOC~~ SOLN
95.0000 mg | Freq: Every day | SUBCUTANEOUS | Status: DC
  Administered 2020-09-28 – 2020-10-08 (×11): 95 mg via SUBCUTANEOUS
  Filled 2020-09-28 (×6): qty 1
  Filled 2020-09-28: qty 0.95
  Filled 2020-09-28 (×4): qty 1

## 2020-09-28 NOTE — Progress Notes (Signed)
   09/28/20 0849  Vitals  Temp 98 F (36.7 C)  Temp Source Oral  BP (!) 169/111  MAP (mmHg) 124  BP Location Right Wrist  BP Method Automatic  Patient Position (if appropriate) Lying  Pulse Rate 96  Pulse Rate Source Monitor  ECG Heart Rate 100  Resp 14  MEWS COLOR  MEWS Score Color Green  Oxygen Therapy  SpO2 95 %  O2 Device Nasal Cannula  O2 Flow Rate (L/min) 4 L/min   Admitted from the ED. Updated on plan of care.

## 2020-09-28 NOTE — ED Notes (Signed)
Patient states she is suppose to be taking blood present medication however hasn't ever started medication

## 2020-09-28 NOTE — Progress Notes (Signed)
EVENT NOTE  Paged to bedside for evaluation of abdominal pain. Pt notes that it occurred while rolling over in bed. Pain is localized to the right subcostal and epigastric region. Sharp in nature. Non-radiating. Worse with movement. Not pleuritic. She says it feels similar to when she ended up needing her gallbladder removed. Last meal was around 4 hours ago. Denies nausea or vomiting.  On exam, she appears comfortable, talking on her cell phone when I first entered the room. She is tender in the RUQ/epigastric region however difficult to determine degree ,given her body habitus, but I was able to put a fairly significant amount of pressure on the area without signs of pain. No rebound tenderness.   Assessment: suspect this is MSK in nature as it started when she rolled over in bed. GERD considered. I also considered pancreatitis, especially as she notes it was similar to when she had to have her gallbladder taken out. Cardiopulmonary source including dissection, ACS and PE considered but less likely in the clinical context. There is no change in her respiratory rate or oxygen requirement and she is hemodynamically stable.  Plan -check lipase to evaluate for acute pancreatitis -GI cocktail -she is instructed to notify her RN for worsening symptoms or changes in symptoms  Elige Radon, MD Internal Medicine Resident PGY-2 [facility] Pager: [phone] 09/28/2020 7:17 PM

## 2020-09-28 NOTE — Progress Notes (Signed)
*  PRELIMINARY RESULTS* Echocardiogram 2D Echocardiogram has been performed with Definity.  Stacey Drain 09/28/2020, 5:36 PM

## 2020-09-28 NOTE — Progress Notes (Addendum)
Inpatient Diabetes Program Recommendations  AACE/ADA: New Consensus Statement on Inpatient Glycemic Control   Target Ranges:  Prepandial:   less than 140 mg/dL      Peak postprandial:   less than 180 mg/dL (1-2 hours)      Critically ill patients:  140 - 180 mg/dL   Results for Yvette Gibbs, Yvette Gibbs (MRN [id number]) as of 09/28/2020 14:44  Ref. Range 09/28/2020 02:49 09/28/2020 12:58  Glucose Latest Ref Range: 70 - 99 mg/dL 348 (H) 473 (H)   Review of Glycemic Control  Diabetes history: DM2  Outpatient Diabetes medications: None Current orders for Inpatient glycemic control: IV insulin  Inpatient Diabetes Program Recommendations:    Insulin: Noted IV insulin was ordered today to start at 14:15. Once provider is ready to transition to SQ insulin, if steroids will be continued please consider ordering Lantus 15 units Q24H, CBGs Q4H, Novolog 0-15 units Q4H.  NOTE: Initial glucose 348 mg/dl on 09/28/20 and patient was given solumedrol 125 mg x 1 and Prednisone 40 mg today.  Noted consult for Diabetes Coordinator. Diabetes Coordinator is not on campus over the weekend but available by pager from 8am to 5pm for questions or concerns. Spoke with patient over the phone about diabetes and home regimen for diabetes control. Patient reports that she does not have a PCP and she is not currently taking any medication as an outpatient for diabetes control. Patient reports that when she came to Emergency Room on 09/17/20 she was told to start Metformin but she states it was not called in to the pharmacy so she has not started taking it.  Patient reports that she does not have a glucometer at home so she has not been monitoring her glucose. Patient reports that her grandmother has DM and takes insulin and that she helps her grandmother with glucose monitoring and insulin administration (insulin pens).  Patient states that she would prefer to take insulin for DM control if she needs to take DM medication.  Discussed  glucose values noted in chart for April 2022 during ED visits and this admission. Also discussed A1C 9% on 05/24/20 and 9.8% on 09/17/20.   Explained that she will need to take DM medication to get DM under control.  Explained what an A1C is and informed patient that  current A1C of 9.8% on 09/17/20 indicates an average glucose of 235 mg/dl. Discussed basic pathophysiology of DM Type 2, basic home care, importance of checking CBGs and maintaining good CBG control to prevent long-term and short-term complications. Reviewed glucose and A1C goals.   Reviewed hyperglycemia and hypoglycemia along with treatment.  Patient states that she has had to treat hypoglycemia with her grandmother so she is familiar with how it is treated.  Discussed impact of nutrition, exercise, stress, sickness, and medications on diabetes control. Patient states that she has been feeling nauseous and not eating much lately. She notes she has been drinking water, regular ginger ale, regular sprite, chocolate milk, and juice. Discussed Carb modified diet and eliminating carbohydrates from drinks if possible. Informed patient that a Living Well with diabetes booklet would be ordered and encouraged patient to read through entire book. Patient notes that her family has told her that there is insulin that can help her lose weight and decrease appetite. Discussed that insulin does not help with that but there are some injectable forms of DM medications (GLP-1) that can help with that as well as DM control. Explained that hospital provider do not usually start patients on GLP-1  medications and that she would need to get established with a PCP and they could decide if she was appropriate to start that type of medication. Patient states that she would still prefer to be prescribed insulin (pens) for DM if she needs to take DM medication as an outpatient. Discussed glucose monitoring, insulin injections, injectable sites, and importance of rotating injection  sites.  Informed patient that RN will be asking her to self-administer insulin to ensure proper technique and ability to administer self insulin shots.   Patient verbalized understanding of information discussed and he states that she has no further questions at this time related to diabetes.   RNs to provide ongoing basic DM education at bedside with this patient and engage patient to actively check blood glucose and administer insulin injections. Ordered Living Well with DM book, RD consult, TOC consult (PCP needs), insulin starter kit, and patient education by RNs.  Thanks, Barnie Alderman, RN, MSN, CDE Diabetes Coordinator Inpatient Diabetes Program [phone] (Team Pager from 8am to 5pm)

## 2020-09-28 NOTE — H&P (Signed)
Date: 09/28/2020               Patient Name:  Yvette Gibbs MRN: [id number]  DOB: 1991-03-13 Age / Sex: 30 y.o., female   PCP: Pcp, No         Medical Service: Internal Medicine Teaching Service         Attending Physician: Dr. Mayford Knife, Dorene Ar, MD    First Contact: Dr. Claudette Laws Pager: [phone]  Second Contact: Dr. Ephriam Knuckles Pager: [phone]       After Hours (After 5p/  First Contact Pager: [phone]  weekends / holidays): Second Contact Pager: [phone]   Chief Complaint: Shortness of breath  History of Present Illness: This is a 30 year female with a history of asthma, depression, HTN, recent COVID infection, obesity, and diabetes who is presenting with shortness of breath and abdominal pain.   Patient states that she has been having abdominal pain for the past 2 weeks, she states it started in her lower quadrant area and has now radiated to the medial epigastric area.  It is a sharp/stabbing/pulling/tugging sensation.  She states that eating certain foods worsen it, including spicy foods.  It is worse with certain movements.  Improves with rest and occasionally did improve with her Zofran, omeprazole, and sulcal freight that she had previously been prescribed.  She states the pain last approximately 4 hours at a time.  Also endorses nausea and an episode of emesis 3 days ago that was brown in color.  She denies any fevers, chills, recent sick contacts, or recent travel.  She also has been having shortness of breath, a productive cough with brown sputum, orthopnea, and PND for the past week.  She states she has been having an increased use of her albuterol inhaler, used about 8 times yesterday.  This morning around 1230-1 AM she started having worsening abdominal pain pain and shortness of breath. She called EMS and on their arrival she reported nausea, abdominal pain, polyuria, and shortness of breath.  On evaluation she is noted to be hypertensive, tachycardic, hyperglycemic, and  had inspiratory and expiratory wheezing.  She was given albuterol, Atrovent, and magnesium and transferred to the ED.  In the ED she was noted to be afebrile, tachyacrdiac, tachypneic at 30, currently on 4 L oxygenating around 90-92%, and hypertensive to 172/104. Labs showed WBC 8.5, hemoglobin 10.5, platelets 222.  Sodium 136, potassium 3.7, chloride 95, CO2 29, glucose 348.  Lipase 29.  BNP 44.  Beta hCG 5.  Covid negative.  Chest x-ray showed diffuse bilateral perihilar opacities, concerning for pulmonary edema.  EKG showed sinus tachycardia, heart rate 114, with incomplete right bundle branch block, similar to priors.  Patient was given a dose of Solu-Medrol and albuterol.  IMTS consulted for admission.  Meds:  Current Meds  Medication Sig  . acetaminophen (TYLENOL) 500 MG tablet Take 1,000 mg by mouth every 6 (six) hours as needed for mild pain or fever.  Marland Kitchen albuterol (VENTOLIN HFA) 108 (90 Base) MCG/ACT inhaler Inhale 1-2 puffs into the lungs every 6 (six) hours as needed for wheezing or shortness of breath.  Marland Kitchen amLODipine (NORVASC) 5 MG tablet Take 1 tablet (5 mg total) by mouth daily for 30 doses.  Marland Kitchen guaiFENesin-dextromethorphan (ROBITUSSIN DM) 100-10 MG/5ML syrup Take 5 mLs by mouth every 4 (four) hours as needed for cough.  . loratadine (CLARITIN) 10 MG tablet Take 10 mg by mouth daily as needed for allergies.  . metFORMIN (GLUCOPHAGE) 1000 MG tablet  Take 1 tablet (1,000 mg total) by mouth 2 (two) times daily.  Marland Kitchen. omeprazole (PRILOSEC) 20 MG capsule Take 1 capsule (20 mg total) by mouth daily.  . ondansetron (ZOFRAN ODT) 4 MG disintegrating tablet Take 1 tablet (4 mg total) by mouth every 8 (eight) hours as needed for nausea or vomiting.  . sucralfate (CARAFATE) 1 g tablet Take 1 tablet (1 g total) by mouth 4 (four) times daily as needed. (Patient taking differently: Take 1 g by mouth 4 (four) times daily as needed (stomach pain).)     Allergies: Allergies as of 09/28/2020 - Review  Complete 09/28/2020  Allergen Reaction Noted  . Lisinopril  06/19/2019  . Fish allergy Itching and Rash 09/02/2015   Past Medical History:  Diagnosis Date  . Asthma   . Depression   . Diabetes mellitus type 2, uncontrolled (HCC)   . Extreme obesity   . Family history of adverse reaction to anesthesia    " my grandmother had an allergic reaction  . Hypertension   . Mental disorder   . Obesity   . OSA (obstructive sleep apnea)     Family History:  Family History  Problem Relation Age of Onset  . Diabetes Mellitus II Maternal Grandmother     Social History: Denies any current smoking, alcohol, or recreational drug use.  Quit drinking and smoking marijuana in December after her COVID admission, she had drink about 1 glass of liquor and smoked marijuana every other day before then.  She currently lives with her grandmother.  Currently attending [employer] for her GED.  Review of Systems: A complete ROS was negative except as per HPI.   Physical Exam: Blood pressure (!) 168/86, pulse (!) 104, temperature 99.4 F (37.4 C), temperature source Oral, resp. rate 20, height 5\' 2"  (1.575 m), weight (!) 190 kg, SpO2 92 %. Physical Exam Constitutional:      Appearance: She is obese.     Comments: NAD, on 4L [place]  HENT:     Head: Normocephalic and atraumatic.     Mouth/Throat:     Mouth: Mucous membranes are moist.     Pharynx: Oropharynx is clear.  Eyes:     Extraocular Movements: Extraocular movements intact.     Conjunctiva/sclera: Conjunctivae normal.     Pupils: Pupils are equal, round, and reactive to light.  Neck:     Comments: Large neck circumference.  Cardiovascular:     Rate and Rhythm: Regular rhythm. Tachycardia present.     Pulses: Normal pulses.     Heart sounds: Normal heart sounds.     Comments: Unable to assess JVD 2/2 obesity.  Pulmonary:     Breath sounds: Wheezing (Diffuse wheezing throughout lung fields) present.     Comments: No accessory muscle use. No increased  work of breathing.  Chest:     Chest wall: No tenderness.  Abdominal:     General: Abdomen is flat.     Palpations: Abdomen is soft.     Comments: Hyperactive bowel sounds. Diffuse tenderness to palpation, no rebound or guarding noted. No appreciable fluid wave.   Musculoskeletal:        General: Swelling (1+ BL LE swelling) present. Normal range of motion.     Cervical back: Normal range of motion and neck supple.  Skin:    General: Skin is warm and dry.     Capillary Refill: Capillary refill takes less than 2 seconds.  Neurological:     General: No focal deficit present.  Mental Status: She is alert and oriented to person, place, and time.  Psychiatric:        Mood and Affect: Mood normal.        Behavior: Behavior normal.      EKG: personally reviewed my interpretation is sinus tachycardia, heart rate 114, with incomplete right bundle branch block, similar to priors  CXR: personally reviewed my interpretation is mildly rotated, poor inspiratory effort, diffuse bilateral pulmonary congestion  Assessment & Plan by Problem: Active Problems:   Asthma exacerbation  Acute hypoxic respiratory failure 2/2 asthma exacerbation versus undiagnosed heart failure: Presenting with a 1 week history of worsening shortness of breath, associated with a productive cough with known sputum production, nausea, emesis, orthopnea, and PND.  Also endorses weight gain.  On exam patient is morbidly obese, diffuse wheezing in all lung fields, has 1+ bilateral pitting edema, with difficult to assess JVD.  Currently on 4 L nasal cannula with her oxygen saturation around 90-92%.  Patient has a history of asthma that she occasionally will take albuterol for, has had an increased use over the past few days.  Chest x-ray showed bilateral pulmonary edema, with small lung fields.  Covid test negative.  BNP is 44 however patient is morbidly obese so this may be not a good indicator of heart failure.  EKG showed  sinus tachycardia with no acute ST changes.  Symptoms do seem to be consistent with an asthma exacerbation given her diffuse wheezing, history of asthma, increased usage of albuterol, and new oxygen requirements, will treat for possible asthma exacerbation.  Unclear trigger, denied any recent sick contacts, recent travel, or new exposures. Symptoms could be consistent with a new onset heart failure, she was endorsing orthopnea, PND, lower extremity swelling, and weight gain.  Weight is up to 418 from 396 11 days ago.  Her chest x-ray also showed bilateral pulmonary congestion. BNP 44. Her exam showed some bilateral pitting edema, however JVD was difficult to assess and no crackles were noted.  We will treat for a asthma exacerbation for now, as well as give a dose of Lasix and obtain an echocardiogram to evaluate cardiac function.  -Status post 1 dose Solu-Medrol 50, start prednisone 40 mg daily -Scheduled albuterol -Supplemental oxygen as needed, wean as tolerated -Echocardiogram -Daily weights -Strict I+Os -Daily CBC and BMP  Abdominal pain: Patient has been having abdominal pain for about 2 weeks, initially in the lower abdomen and has radiated to the epigastric area.  Sharp sensation, worsens with spicy foods and certain movements, improves with rest.  Had a CT abdomen pelvis on 4/5 that showed no acute intra-abdominal process, showed status post cholecystectomy.  She reported improvement with the omeprazole, sucralfate, and Zofran that she had previously been prescribed.  Patient is afebrile with no leukocytosis. Her CMP today was unremarkable, lipase normal.  Beta hCG negative. This could be related to volume overload if she does have heart failure.   -Continue omeprazole daily -Continue Zofran as needed -Trial of GI cocktail -Daily CBC  Diabetes: Patient is best to be on Metformin 1000 mg twice daily, patient states that she has not been taking this because it had not been sent in.  Her  urinalysis showed glucose greater than 5 ketones.  Glucose was up to 348.  Last A1c was 9.  She had no anion gap and no acidosis.  Patient does not appear to be in DKA at this time.    -SSI-moderate -Frequent CBGs -Repeat A1c today  Normocytic anemia:  Hemoglobin 10.5, most recent hemoglobins have been around 11-12..  Denies any bleeding symptoms at this time.  -Consider iron studies -Daily CBC  Prior Covid infection: Patient had been admitted in December 2021 for acute hypoxic respiratory failure requiring remdesivir and steroids.  This may have impaired her respiratory status.  Covid today is negative.  Will monitor for now.  Hypertension: Patient had been prescribed amlodipine in the past, she states she has not been taking this because she did not know what it was for.  Blood pressures have been elevated while admitted. -Restart amlodipine 5 mg daily  Depression: Patient is not on any medications at this time.  We will continue to monitor for now.  Obesity: Suspected OSA: Patient has morbid obesity, BMI of 76, also endorses difficulty sleeping, snoring, morning headaches.  She had previously been placed on CPAP during prior admissions.  Has never had a sleep study at home.  She has a large circumference.   -Patient will need sleep study outpatient -Consider empiric CPAP while admitted  Fat deposition in liver: Noted on CT abd/pelvis on 09/17/20. Likely 2/2 obesity. Transaminases are unremarkable.  -Continue to monitor  Dispo: Admit patient to Inpatient with expected length of stay greater than 2 midnights.  Signed: Claudean Severance, MD 09/28/2020, 5:59 AM  Pager: [phone] After 5pm on weekdays and 1pm on weekends: On Call pager: [phone]

## 2020-09-28 NOTE — ED Provider Notes (Signed)
[facility] EMERGENCY DEPARTMENT Provider Note   CSN: [id number] Arrival date & time: 09/28/20  0246     History Chief Complaint  Patient presents with  . Wheezing/Hyperglycemia/Abdominal Pain     Yvette Gibbs is a 30 y.o. female.  Patient with past medical history notable for asthma, diabetes, extreme obesity, hypertension, presents to the emergency department with a chief complaint of shortness of breath.  She states that she has been seen several times in the past week for the same.  When EMS picked her up tonight, she had significant wheezing.  She was given 10 of albuterol and 2 g IV magnesium by EMS.  She continues to feel short of breath and wheezing.  She denies any fevers.  Denies nausea or vomiting.  The history is provided by the patient. No language interpreter was used.       Past Medical History:  Diagnosis Date  . Asthma   . Depression   . Diabetes mellitus type 2, uncontrolled (HCC)   . Extreme obesity   . Family history of adverse reaction to anesthesia    " my grandmother had an allergic reaction  . Hypertension   . Mental disorder   . Obesity   . OSA (obstructive sleep apnea)     Patient Active Problem List   Diagnosis Date Noted  . Acute respiratory failure due to COVID-19 (HCC) 05/21/2020  . Gastroesophageal reflux disease without esophagitis 06/19/2019  . Loud snoring 06/19/2019  . Type 2 diabetes mellitus without complication, without long-term current use of insulin (HCC) 03/04/2019  . Abdominal pain 03/03/2019  . HTN (hypertension) 03/03/2019  . Hyperglycemia 03/03/2019  . Proteinuria 03/03/2019  . HCAP (healthcare-associated pneumonia) 09/07/2015  . Severe sepsis (HCC) 09/07/2015  . Asthma 09/07/2015  . Pleuritic chest pain 09/01/2015  . Morbid obesity (HCC) 09/01/2015  . OSA (obstructive sleep apnea) 09/01/2015  . Influenza A 09/01/2015  . Biliary colic 09/04/2014    Past Surgical History:  Procedure Laterality  Date  . APPENDECTOMY    . CHOLECYSTECTOMY N/A 09/05/2014   Procedure: LAPAROSCOPIC CHOLECYSTECTOMY;  Surgeon: Axel Filler, MD;  Location: [facility] OR;  Service: General;  Laterality: N/A;  . TONSILLECTOMY       OB History   No obstetric history on file.     Family History  Problem Relation Age of Onset  . Diabetes Mellitus II Maternal Grandmother     Social History   Tobacco Use  . Smoking status: Never Smoker  . Smokeless tobacco: Never Used  Vaping Use  . Vaping Use: Never used  Substance Use Topics  . Alcohol use: Yes  . Drug use: Yes    Types: Marijuana    Home Medications Prior to Admission medications   Medication Sig Start Date End Date Taking? Authorizing Provider  acetaminophen (TYLENOL) 500 MG tablet Take 1,000 mg by mouth every 6 (six) hours as needed for mild pain or fever.    [provider]  albuterol (VENTOLIN HFA) 108 (90 Base) MCG/ACT inhaler Inhale 1-2 puffs into the lungs every 6 (six) hours as needed for wheezing or shortness of breath. 03/21/19   Marcine Matar, MD  amLODipine (NORVASC) 5 MG tablet Take 1 tablet (5 mg total) by mouth daily. Patient not taking: No sig reported 05/25/20   Leatha Gilding, MD  amLODipine (NORVASC) 5 MG tablet Take 1 tablet (5 mg total) by mouth daily for 30 doses. 09/17/20 10/17/20  Terald Sleeper, MD  guaiFENesin-dextromethorphan [facility] DM)  100-10 MG/5ML syrup Take 5 mLs by mouth every 4 (four) hours as needed for cough. 03/04/19   Dhungel, Theda Belfast, MD  ibuprofen (ADVIL) 600 MG tablet Take 1 tablet (600 mg total) by mouth every 6 (six) hours as needed for up to 20 doses for fever, mild pain or moderate pain. 09/17/20   Terald Sleeper, MD  loratadine (CLARITIN) 10 MG tablet Take 10 mg by mouth daily as needed for allergies.    [provider]  metFORMIN (GLUCOPHAGE) 1000 MG tablet Take 1 tablet (1,000 mg total) by mouth 2 (two) times daily. 09/17/20 10/17/20  Terald Sleeper, MD  metFORMIN (GLUCOPHAGE)  500 MG tablet Take 1 tablet (500 mg total) by mouth 2 (two) times daily with a meal. Patient not taking: No sig reported 05/25/20   Leatha Gilding, MD  omeprazole (PRILOSEC) 20 MG capsule Take 1 capsule (20 mg total) by mouth daily. 09/24/20   Sabas Sous, MD  ondansetron (ZOFRAN ODT) 4 MG disintegrating tablet Take 1 tablet (4 mg total) by mouth every 8 (eight) hours as needed for nausea or vomiting. 09/24/20   Sabas Sous, MD  PRESCRIPTION MEDICATION Inhale 2 puffs into the lungs every 4 (four) hours as needed (wheezing &shortnes of breath). Patient not sure about the name    [provider]  sucralfate (CARAFATE) 1 g tablet Take 1 tablet (1 g total) by mouth 4 (four) times daily as needed. 09/24/20   Sabas Sous, MD    Allergies    Lisinopril and Fish allergy  Review of Systems   Review of Systems  All other systems reviewed and are negative.   Physical Exam Updated Vital Signs BP (!) 172/105   Pulse (!) 108   Temp 99.4 F (37.4 C) (Oral)   Resp (!) 30   Ht 5\' 2"  (1.575 m)   Wt (!) 190 kg   SpO2 91%   BMI 76.61 kg/m   Physical Exam Vitals and nursing note reviewed.  Constitutional:      General: She is not in acute distress.    Appearance: She is well-developed.  HENT:     Head: Normocephalic and atraumatic.  Eyes:     Conjunctiva/sclera: Conjunctivae normal.  Cardiovascular:     Rate and Rhythm: Regular rhythm. Tachycardia present.     Heart sounds: No murmur heard.   Pulmonary:     Effort: Respiratory distress present.     Breath sounds: Wheezing present.     Comments: Increased work of breathing Wheezing throughout Abdominal:     Palpations: Abdomen is soft.     Tenderness: There is no abdominal tenderness.  Musculoskeletal:     Cervical back: Neck supple.     Right lower leg: No edema.     Left lower leg: No edema.  Skin:    General: Skin is warm and dry.  Neurological:     Mental Status: She is alert.  Psychiatric:        Mood  and Affect: Mood normal.        Behavior: Behavior normal.     ED Results / Procedures / Treatments   Labs (all labs ordered are listed, but only abnormal results are displayed) Labs Reviewed  CBC WITH DIFFERENTIAL/PLATELET - Abnormal; Notable for the following components:      Result Value   Hemoglobin 10.5 (*)    MCHC 29.2 (*)    RDW 15.6 (*)    Monocytes Absolute 1.1 (*)  All other components within normal limits  COMPREHENSIVE METABOLIC PANEL - Abnormal; Notable for the following components:   Chloride 95 (*)    Glucose, Bld 348 (*)    BUN 5 (*)    Albumin 3.0 (*)    All other components within normal limits  RESP PANEL BY RT-PCR (FLU A&B, COVID) ARPGX2  LIPASE, BLOOD  BRAIN NATRIURETIC PEPTIDE  I-STAT BETA HCG BLOOD, ED ([facility], [facility], [facility] ONLY)    EKG None  Radiology DG Chest Port 1 View  Result Date: 09/28/2020 CLINICAL DATA:  Shortness of breath EXAM: PORTABLE CHEST 1 VIEW COMPARISON:  05/21/2020 FINDINGS: Bilateral parahilar opacities with mild cardiomegaly. No pneumothorax or sizable pleural effusion. IMPRESSION: Cardiomegaly and bilateral parahilar opacities, likely pulmonary edema. Electronically Signed   By: Deatra Robinson M.D.   On: 09/28/2020 03:21    Procedures .Critical Care Performed by: Roxy Horseman, PA-C Authorized by: Roxy Horseman, PA-C   Critical care provider statement:    Critical care time (minutes):  50   Critical care was necessary to treat or prevent imminent or life-threatening deterioration of the following conditions:  Respiratory failure   Critical care was time spent personally by me on the following activities:  Discussions with consultants, evaluation of patient's response to treatment, examination of patient, ordering and performing treatments and interventions, ordering and review of laboratory studies, ordering and review of radiographic studies, pulse oximetry, re-evaluation of patient's condition, obtaining history from patient or  surrogate and review of old charts     Medications Ordered in ED Medications  ipratropium-albuterol (DUONEB) 0.5-2.5 (3) MG/3ML nebulizer solution 5 mL (has no administration in time range)  methylPREDNISolone sodium succinate (SOLU-MEDROL) 125 mg/2 mL injection 125 mg (125 mg Intravenous Given 09/28/20 0301)    ED Course  I have reviewed the triage vital signs and the nursing notes.  Pertinent labs & imaging results that were available during my care of the patient were reviewed by me and considered in my medical decision making (see chart for details).    MDM Rules/Calculators/A&P                          Patient here with shortness of breath.  Has been seen several times for this and abdominal pain for the past week.  She is noted to be tachycardic, tachypneic, and hypoxic.  She is requiring 4 L nasal cannula to maintain greater than 90% pulse oxygenation.    Chest x-ray notable for cardiomegaly with bilateral perihilar opacities, likely pulmonary edema.  Laboratory work-up notable for normal BNP, no leukocytosis.  Patient given several rounds of albuterol here and by EMS.  Given Solu-Medrol here and magnesium by EMS.  Patient will need admission to the hospital for respiratory failure.  She is agreeable with this plan.  She is an unassigned patient.  She states that she does not have an active PCP.  I discussed the case with the internal medicine residents, who are appreciated for admitting the patient. Final Clinical Impression(s) / ED Diagnoses Final diagnoses:  Acute respiratory failure with hypoxia [facility])    Rx / DC Orders ED Discharge Orders    None       Roxy Horseman, PA-C 09/28/20 0507    Zadie Rhine, MD 09/28/20 0700

## 2020-09-28 NOTE — ED Triage Notes (Signed)
Patient arrived with EMS from home reports generalized abdominal pain with nausea this week , CBG= 441 by EMS , she received Albuterol 10 mg / Atrovent nebulizer and Magnesium 2 grams IV by EMS for wheezing/SOB .

## 2020-09-28 NOTE — Progress Notes (Addendum)
HD#0 Subjective:   Admitted early this morning.  During evaluation at bedside this morning, patient reports she is doing "ok." Endorses persistent abdominal pain which she describes as primarily located in her upper abdomen and feels like a burning sensation which rises up her chest. She states the pain started on the day she had a miscarriage of a 76-month-old fetus, 09/17/20. She notices increased pain when she eats spicy or acidic foods. She reports she was pregnant with a baby boy which was confirmed with a blood test. States she has a 28- and 75-year-old who are currently being looked after by their father. States she thinks the miscarriage occurred due to stress. Per chart review, beta hcg at that visit was <5.   States she is now breathing more comfortably since being placed on supplemental oxygen and since receiving IV Lasix 40 mg.  Objective:   Vital signs in last 24 hours: Vitals:   09/28/20 0849 09/28/20 0851 09/28/20 0852 09/28/20 1226  BP: (!) 169/111   (!) 143/66  Pulse: 96   93  Resp: 14 16 18 19   Temp: 98 F (36.7 C)   97.7 F (36.5 C)  TempSrc: Oral   Oral  SpO2: 95%   95%  Weight: (!) 190 kg     Height: 5\' 2"  (1.575 m)      Supplemental O2: Nasal Cannula SpO2: 95 % O2 Flow Rate (L/min): 4 L/min  Physical Exam Constitutional: morbidly-obese young woman sitting up in hospital bed, in no acute distress, very talkative Cardiovascular: tachycardia, regular rhythm, no m/r/g, unable to assess JVD due to body habitus Pulmonary/Chest: normal work of breathing on 4L [place] with O2 sat >90% on 4 L [place], speaking in full sentences without dyspnea, states she is unable to lean forward for lung auscultation due to abdominal pain; trace to 1+ lower extremity swelling Abdominal: soft except for localized firmness in epigastric area; epigastric, RUQ and LUQ tenderness to palpation. Normoactive bowel sounds.  Filed Weights   09/28/20 0250 09/28/20 0849  Weight: (!) 190 kg (!) 190 kg     Intake/Output Summary (Last 24 hours) at 09/28/2020 1511 Last data filed at 09/28/2020 1452 Gross per 24 hour  Intake 840 ml  Output 1600 ml  Net -760 ml   Net IO Since Admission: -760 mL [09/28/20 1511]  Pertinent Labs: CBC Latest Ref Rng & Units 09/28/2020 09/26/2020 09/23/2020  WBC 4.0 - 10.5 K/uL 8.5 6.9 11.6(H)  Hemoglobin 12.0 - 15.0 g/dL 10.5(L) 11.2(L) 11.8(L)  Hematocrit 36.0 - 46.0 % 36.0 39.3 39.3  Platelets 150 - 400 K/uL 222 242 257    CMP Latest Ref Rng & Units 09/28/2020 09/28/2020 09/26/2020  Glucose 70 - 99 mg/dL 09/30/2020) 09/28/2020) 932(I)  BUN 6 - 20 mg/dL - 5(L) 6  Creatinine 712(W - 1.00 mg/dL - 580(D 9.83  Sodium 3.82 - 145 mmol/L - 136 136  Potassium 3.5 - 5.1 mmol/L - 3.7 4.3  Chloride 98 - 111 mmol/L - 95(L) 100  CO2 22 - 32 mmol/L - 29 29  Calcium 8.9 - 10.3 mg/dL - 9.0 8.9  Total Protein 6.5 - 8.1 g/dL - 7.2 5.05)  Total Bilirubin 0.3 - 1.2 mg/dL - 0.5 0.6  Alkaline Phos 38 - 126 U/L - 69 67  AST 15 - 41 U/L - 30 39  ALT 0 - 44 U/L - 36 43    Imaging: DG Chest Port 1 View  Result Date: 09/28/2020 CLINICAL DATA:  Shortness of breath EXAM:  PORTABLE CHEST 1 VIEW COMPARISON:  05/21/2020 FINDINGS: Bilateral parahilar opacities with mild cardiomegaly. No pneumothorax or sizable pleural effusion. IMPRESSION: Cardiomegaly and bilateral parahilar opacities, likely pulmonary edema. Electronically Signed   By: Deatra Robinson M.D.   On: 09/28/2020 03:21    Assessment/Plan:   Active Problems:   Asthma exacerbation   Patient Summary:  Yvette Gibbs is a 30 year old woman with a history asthma, depression, HTN, recent COVID infection, morbid obesity, and diabetes who is presented with shortness of breath and abdominal pain and admitted for acute hypoxic respiratory failure.   Acute hypoxic respiratory failure 2/2 asthma exacerbation vs undiagnosed heart failure Suspected underlying OHS/OSA Uncontrolled HTN Evaluated a few hours after admission early this  morning. Exam stable. Still hypertensive though tachycardia has improved. Still requiring 4 L [place] to maintain O2 saturations >90% however reports slight improvement in dyspnea since receiving Lasix 40 mg IV Lasix. 1.6 L UOP + 1x unmeasured urine occurrence.  - Echocardiogram - s/p 40 mg IV Lasix, suspect patient would benefit from additional diuresis - Wean supplemental oxygen as tolerated - Continue prednisone 40 mg daily for possible asthma exacerbation - Scheduled albuterol - Continue home amlodipine 10 mg daily, consider addition of ARB if BP remains elevated - Strict I&O, daily weights - AM BMP - Empiric nightly CPAP - Recommend outpatient sleep study; patient would ultimately benefit from significant weight loss  Abdominal pain History suggestive of gastritis vs gastroparesis vs secondary to uncontrolled diabetes. She reports taking 4 ibuprofen (unknown dose) every 8 hours over the last two weeks. Denies blood in her stool or dark tarry stool. Normocytic anemia with Hgb 10.5 MCV 92.3 appears to be her recent baseline. Patient seems to relate the pain with reported recent pregnancy, however history of 09/17/20 is very questionable given beta hcg <5 at that ED visit. Reassuringly had a CT abdomen pelvis on 4/5 that showed no acute intra-abdominal process, showed status post cholecystectomy.  - Switch PO Protonix to Protonix 40 mg IV q12h - Attempted to contact family to corroborate pregnancy history, no answer, will keep trying  Diabetes Last A1c was 9. Previously prescribed metformin however did not pick up nor did she follow-up with primary care. Due to her elevated blood sugars (300s-400s) and difficult to estimate insulin needs, will start insulin drip. - Start insulin gtt in order to assess insulin needs - Start metformin 500 mg daily - Repeat A1c pending  Normocytic anemia Normocytic anemia with Hgb 10.5 MCV 92.3 appears to be her recent baseline. Last ferritin 05/25/20 was wnl at 140  in setting of COVID infection. Would expect iron deficiency anemia due to chronic blood loss in young woman. Kidney function appears normal. Will repeat iron studies. - AM CBC - AM ferritin, iron, TIBC, Tsat   Diet: Carb-Modified IVF: None VTE: Enoxaparin Code: Full    Please contact the on call pager after 5 pm and on weekends at [phone].  Alphonzo Severance, MD PGY-1 Internal Medicine Teaching Service Pager: [phone] 09/28/2020

## 2020-09-28 NOTE — Hospital Course (Addendum)
Admitted 09/28/2020  Allergies: Lisinopril and Fish allergy Pertinent Hx: DM, asthma, depression, HTN, recent covid infection, morbid obesity  30 y.o. female p/w SOB, abd pain  * AHRF 2/2 asthma exacerbation vs HF: SOB x 1 week, diffuse wheezing, on 4L [place]. Treated with mag, albuterol, solumedrol, and atrovent. BNP 44. CXR showed BL pulm congestion.  Getting echocardiogram.   *Abd pain: x2 week, with n/v. Unclear cause, ?PUD vs GERD. Lipase NL, beta hcg nl. CT abd/pelvis 1 week ago reassuring. Giving GI cocktail.   Consults: None  Meds: Prednisone, albuterol, maalox, amlodipine, tylenol VTE ppx: Lovenox IVF: None Diet: CM    This is a 30 year female with a history of asthma, depression, HTN, recent COVID infection who is presenting with shortness of breath.   Feeling SOB for awhile, has been seen multiple times, noted to be wheezy in all fields. Given mag, albuterol. Remains tachypneic, now requiring oxygen, around 92-93. Still feels shortness of breath. Given solumedrol.  CXR showed diffuse pulmonary edema and cardiomegaly. Afebrile Describes some possible abdominal pain, had a CT scan on 4/5 that showed no acute intra-abdominal strain. Marland Kitchen

## 2020-09-29 DIAGNOSIS — G4733 Obstructive sleep apnea (adult) (pediatric): Secondary | ICD-10-CM | POA: Diagnosis not present

## 2020-09-29 DIAGNOSIS — E662 Morbid (severe) obesity with alveolar hypoventilation: Secondary | ICD-10-CM | POA: Diagnosis present

## 2020-09-29 DIAGNOSIS — E1165 Type 2 diabetes mellitus with hyperglycemia: Secondary | ICD-10-CM

## 2020-09-29 DIAGNOSIS — K760 Fatty (change of) liver, not elsewhere classified: Secondary | ICD-10-CM | POA: Diagnosis present

## 2020-09-29 DIAGNOSIS — I110 Hypertensive heart disease with heart failure: Secondary | ICD-10-CM | POA: Diagnosis not present

## 2020-09-29 DIAGNOSIS — R748 Abnormal levels of other serum enzymes: Secondary | ICD-10-CM | POA: Diagnosis not present

## 2020-09-29 DIAGNOSIS — J9601 Acute respiratory failure with hypoxia: Secondary | ICD-10-CM | POA: Diagnosis present

## 2020-09-29 DIAGNOSIS — R1030 Lower abdominal pain, unspecified: Secondary | ICD-10-CM | POA: Diagnosis not present

## 2020-09-29 DIAGNOSIS — D649 Anemia, unspecified: Secondary | ICD-10-CM | POA: Diagnosis present

## 2020-09-29 DIAGNOSIS — I5033 Acute on chronic diastolic (congestive) heart failure: Secondary | ICD-10-CM | POA: Diagnosis not present

## 2020-09-29 DIAGNOSIS — R1013 Epigastric pain: Secondary | ICD-10-CM | POA: Diagnosis not present

## 2020-09-29 DIAGNOSIS — I517 Cardiomegaly: Secondary | ICD-10-CM | POA: Diagnosis present

## 2020-09-29 DIAGNOSIS — J45901 Unspecified asthma with (acute) exacerbation: Secondary | ICD-10-CM | POA: Diagnosis not present

## 2020-09-29 LAB — CBC
HCT: 38.8 % (ref 36.0–46.0)
Hemoglobin: 11 g/dL — ABNORMAL LOW (ref 12.0–15.0)
MCH: 26.7 pg (ref 26.0–34.0)
MCHC: 28.4 g/dL — ABNORMAL LOW (ref 30.0–36.0)
MCV: 94.2 fL (ref 80.0–100.0)
Platelets: 236 10*3/uL (ref 150–400)
RBC: 4.12 MIL/uL (ref 3.87–5.11)
RDW: 15.4 % (ref 11.5–15.5)
WBC: 9.7 10*3/uL (ref 4.0–10.5)
nRBC: 0.2 % (ref 0.0–0.2)

## 2020-09-29 LAB — GLUCOSE, CAPILLARY
Glucose-Capillary: 140 mg/dL — ABNORMAL HIGH (ref 70–99)
Glucose-Capillary: 162 mg/dL — ABNORMAL HIGH (ref 70–99)
Glucose-Capillary: 162 mg/dL — ABNORMAL HIGH (ref 70–99)
Glucose-Capillary: 123 mg/dL — ABNORMAL HIGH (ref 70–99)
Glucose-Capillary: 114 mg/dL — ABNORMAL HIGH (ref 70–99)
Glucose-Capillary: 138 mg/dL — ABNORMAL HIGH (ref 70–99)
Glucose-Capillary: 127 mg/dL — ABNORMAL HIGH (ref 70–99)
Glucose-Capillary: 172 mg/dL — ABNORMAL HIGH (ref 70–99)
Glucose-Capillary: 252 mg/dL — ABNORMAL HIGH (ref 70–99)
Glucose-Capillary: 275 mg/dL — ABNORMAL HIGH (ref 70–99)
Glucose-Capillary: 239 mg/dL — ABNORMAL HIGH (ref 70–99)
Glucose-Capillary: 240 mg/dL — ABNORMAL HIGH (ref 70–99)
Glucose-Capillary: 231 mg/dL — ABNORMAL HIGH (ref 70–99)
Glucose-Capillary: 344 mg/dL — ABNORMAL HIGH (ref 70–99)

## 2020-09-29 LAB — COMPREHENSIVE METABOLIC PANEL
ALT: 31 U/L (ref 0–44)
AST: 25 U/L (ref 15–41)
Albumin: 2.9 g/dL — ABNORMAL LOW (ref 3.5–5.0)
Alkaline Phosphatase: 66 U/L (ref 38–126)
Anion gap: 7 (ref 5–15)
BUN: 14 mg/dL (ref 6–20)
CO2: 35 mmol/L — ABNORMAL HIGH (ref 22–32)
Calcium: 9.1 mg/dL (ref 8.9–10.3)
Chloride: 97 mmol/L — ABNORMAL LOW (ref 98–111)
Creatinine, Ser: 0.67 mg/dL (ref 0.44–1.00)
GFR, Estimated: >60 mL/min (ref >60)
Glucose, Bld: 153 mg/dL — ABNORMAL HIGH (ref 70–99)
Potassium: 4.3 mmol/L (ref 3.5–5.1)
Sodium: 139 mmol/L (ref 135–145)
Total Bilirubin: 0.1 mg/dL — ABNORMAL LOW (ref 0.3–1.2)
Total Protein: 7.7 g/dL (ref 6.5–8.1)

## 2020-09-29 LAB — ECHOCARDIOGRAM COMPLETE
AR max vel: 1.9 cm2
AV Area VTI: 1.7 cm2
AV Area mean vel: 1.75 cm2
AV Mean grad: 4.9 mmHg
AV Peak grad: 10.1 mmHg
Ao pk vel: 1.59 m/s
Area-P 1/2: 2.34 cm2
Height: 62 in
S' Lateral: 2.2 cm
Weight: 6701.98 oz

## 2020-09-29 LAB — IRON AND TIBC
Iron: 34 ug/dL (ref 28–170)
Saturation Ratios: 7 % — ABNORMAL LOW (ref 10.4–31.8)
TIBC: 507 ug/dL — ABNORMAL HIGH (ref 250–450)
UIBC: 473 ug/dL

## 2020-09-29 LAB — FERRITIN: Ferritin: 78 ng/mL (ref 11–307)

## 2020-09-29 MED ORDER — AMLODIPINE BESYLATE 5 MG PO TABS: 5.0000 mg | ORAL_TABLET | Freq: Every day | ORAL | Status: DC

## 2020-09-29 MED ORDER — INSULIN ASPART 100 UNIT/ML ~~LOC~~ SOLN
0.0000 [IU] | Freq: Every day | SUBCUTANEOUS | Status: DC
  Administered 2020-09-29: 4 [IU] via SUBCUTANEOUS
  Administered 2020-09-30 – 2020-10-01 (×2): 3 [IU] via SUBCUTANEOUS
  Administered 2020-10-02: 2 [IU] via SUBCUTANEOUS

## 2020-09-29 MED ORDER — IRBESARTAN 300 MG PO TABS
150.0000 mg | ORAL_TABLET | Freq: Every day | ORAL | Status: DC
  Administered 2020-09-29 – 2020-10-08 (×10): 150 mg via ORAL
  Filled 2020-09-29 (×10): qty 1

## 2020-09-29 MED ORDER — INSULIN ASPART 100 UNIT/ML ~~LOC~~ SOLN
0.0000 [IU] | Freq: Three times a day (TID) | SUBCUTANEOUS | Status: DC
  Administered 2020-09-30 (×2): 7 [IU] via SUBCUTANEOUS
  Administered 2020-09-30 – 2020-10-01 (×2): 11 [IU] via SUBCUTANEOUS
  Administered 2020-10-01 – 2020-10-02 (×4): 4 [IU] via SUBCUTANEOUS
  Administered 2020-10-04 – 2020-10-05 (×2): 7 [IU] via SUBCUTANEOUS
  Administered 2020-10-05 – 2020-10-06 (×2): 4 [IU] via SUBCUTANEOUS
  Administered 2020-10-06: 3 [IU] via SUBCUTANEOUS
  Administered 2020-10-07 (×2): 4 [IU] via SUBCUTANEOUS
  Administered 2020-10-08: 3 [IU] via SUBCUTANEOUS
  Administered 2020-10-08: 4 [IU] via SUBCUTANEOUS

## 2020-09-29 MED ORDER — PANTOPRAZOLE SODIUM 40 MG IV SOLR: 40.0000 mg | INTRAVENOUS | Status: DC

## 2020-09-29 MED ORDER — INSULIN GLARGINE 100 UNIT/ML ~~LOC~~ SOLN: 50.0000 [IU] | Freq: Every day | SUBCUTANEOUS | Status: DC

## 2020-09-29 MED ORDER — PANTOPRAZOLE SODIUM 40 MG PO TBEC
40.0000 mg | DELAYED_RELEASE_TABLET | Freq: Every day | ORAL | Status: DC
  Administered 2020-09-30 – 2020-10-08 (×9): 40 mg via ORAL
  Filled 2020-09-29 (×9): qty 1

## 2020-09-29 MED ORDER — METFORMIN HCL 500 MG PO TABS
500.0000 mg | ORAL_TABLET | Freq: Every day | ORAL | Status: DC
  Administered 2020-09-30 – 2020-10-06 (×7): 500 mg via ORAL
  Filled 2020-09-29 (×7): qty 1

## 2020-09-29 MED ORDER — AMLODIPINE BESYLATE 10 MG PO TABS
10.0000 mg | ORAL_TABLET | Freq: Every day | ORAL | Status: DC
  Administered 2020-09-29 – 2020-10-08 (×10): 10 mg via ORAL
  Filled 2020-09-29 (×10): qty 1

## 2020-09-29 MED ORDER — INSULIN GLARGINE 100 UNIT/ML ~~LOC~~ SOLN
50.0000 [IU] | Freq: Two times a day (BID) | SUBCUTANEOUS | Status: DC
  Administered 2020-09-29 – 2020-09-30 (×2): 50 [IU] via SUBCUTANEOUS
  Filled 2020-09-29 (×3): qty 0.5

## 2020-09-29 MED ORDER — AMLODIPINE BESYLATE 5 MG PO TABS
5.0000 mg | ORAL_TABLET | Freq: Every day | ORAL | Status: AC
  Administered 2020-09-29: 5 mg via ORAL
  Filled 2020-09-29: qty 1

## 2020-09-29 NOTE — Progress Notes (Signed)
UPDATE NOTE  24H insulin requirement on the drip was ~204U.  I returned to the patient's room this afternoon and discussed plans for better glycemic control moving forward. She inquired into a medication that her aunt had mentioned to her that "is for diabetes but also helps with making her less hungry". I explained that I suspect that she is referring to a GLP-1 which I think would be a wonderful choice, in addition to metformin and lantus. She also asked for an insulin meter to be provided at discharge. I explained that the GLP-1 and metformin will be titrated up slowly and that it will be incredibly important that she follow closely with her PCP.  I noted that she previously had been seen at [facility]. She notes that this is where she plans to return to for follow up.  She had also noted that her aunt had told her that there is a pill she can take for sleep apnea. I provided education on sleep apnea as well as my thoughts that she likely has OHS. I used visual diagrams to explain the role of CPAP. She was able to draw an understanding of air-splinting with use of the CPAP as a type of "cast" that kept her airways open.  I explained that, unfortunately, this would not be able to be sent home with her and would require that she undergo a sleep study. She expresses understanding of this. She asked discharge. I explained that we would ideally keep her at least overnight, since the insulin drip was just turned off 20 mins ago and that I would like to see how much oxygen she is requiring with ambulation. She is ammenable to discharging home with supplemental oxygen if necessary.  Finally, I reiterated the importance of weight management moving forward and discussed that she may benefit from a referral to the weight loss center.  Elige Radon, MD Internal Medicine Resident PGY-2 [facility] Pager: [phone] 09/29/2020 6:40 PM

## 2020-09-29 NOTE — Progress Notes (Signed)
HD#1 Subjective:   No significant overnight events. Sleeping on bedside evaluation. Woke enough to imply abdominal pain is improved  Objective:   Vital signs in last 24 hours: Vitals:   09/28/20 2257 09/29/20 0000 09/29/20 0038 09/29/20 0216  BP: (!) 157/100 (!) 175/96 (!) 137/107   Pulse: 97 100 95 87  Resp: (!) 22 (!) 24 20 19   Temp: 98.8 F (37.1 C) 98.7 F (37.1 C) 98.3 F (36.8 C)   TempSrc: Oral Axillary Oral   SpO2: 92% 94% 93% 93%  Weight:      Height:       Supplemental O2: Nasal Cannula SpO2: 93 % O2 Flow Rate (L/min): 4 L/min  Physical Exam Constitutional: morbidly obese, no acute distress Cardiac: RRR Pulm: gross upper airway expiratory sounds and snoring. O2 sats in the mid 90s on 4L GI: no tenderness to palpation Neuro: somnulent. Briefly opens eyes to noxious stimuli but falls back asleep quickly. PERRL.    Filed Weights   09/28/20 0250 09/28/20 0849  Weight: (!) 190 kg (!) 190 kg    Intake/Output Summary (Last 24 hours) at 09/29/2020 0801 Last data filed at 09/29/2020 10/01/2020 Gross per 24 hour  Intake 1560 ml  Output 2500 ml  Net -940 ml   Net IO Since Admission: -940 mL [09/29/20 0801]    Assessment/Plan:   Active Problems:   Asthma exacerbation   Patient Summary:  Yvette Gibbs is a 30 year old woman with a history asthma, depression, HTN, recent COVID infection, morbid obesity, and diabetes who is presented with shortness of breath and abdominal pain and admitted for acute hypoxic respiratory failure which is suspected to be secondary to untreated OSA/OHS.  Acute hypoxic respiratory failure.  She has a diagnosis of asthma in the chart however this seems to be based on pt report. I reviewed her chart and see that she has a history dating back to at least 2017 of presenting to the ED with respiratory distress that was not responsive to albuterol. She has not underwent any formal testing which I think would be beneficial in her case due  to multiple confounders including OSA/OHS as well as possibly some cardiac dysfunction.  I also reviewed an echocardiogram that she had performed in 2017 which showed severe thickening of the LV. Her body habitus inhibits good image quality on echocardiograms. We did try to repeat another echocardiogram this admission and it was completed yesterday however the read is still pending. Suspected underlying OHS/OSA. Spoke with RT who notes she only wore CPAP very briefly last evening and has refused it since.  Suspect she would eventually end up with a tracheostomy if she is not amenable to NIPPV. Plan -will check an ABG given worsened somnolence and increased bicarb on BMP from 2 this morning -wean supplemental O2 as able. Given my suspicion that her respiratory failure is most likely a result of OHS/OSA, NIPPV would be largely more beneficial than nasal canula alone as it can provide the end expiratory pressure needed -awaiting official read on echocardiogram obtained yesterday -continue prednisone -qHS CPAP -will need outpatient sleep study and PFTs after discharge   Uncontrolled HTN. Likely in the setting of untreated OSA/OHS. I reviewed her chart which shows that she has indicated she did not want to be on medication and refused pharmaceutical management as recent as January of this year. Blood pressures remain above goal today.  -start irbasartan 150mg  daily -continue amlodipine -consider transition to olmesartan-amlodipine at discharge (on medicaid formulary) if patient  is willing to start   Abdominal pain. Pt notes improvement this morning. Suspect that the acute event last night was MSK. Lipase was normal.  Suspect she has GERD vs PUD in the setting of body habitus and NSAID use. I do not think there is an indication for an urgent EGD at this time and she would be very high risk given her body habitus and untreated OSA/OHS.  Gastroparesis is also a likely diagnosis in the setting of  untreated diabetes. Hyperglycemia may be playing a role On chart review, it looks like she has been experiencing abdominal pain for a number of years, initially noted in 2016. Since then, she has had her gallbladder and appendix resected. She additionally reported, this admission, that she had a miscarriage a couple of weeks ago, at which time she says she was 4 months pregnant. She presented to the ED at that time at which HCG was <5 indicating that she was not pregnant recently. She went so far as to note that she had fetal ultrasounds done in [place] however, unfortunately, those records are not available. I question a psychological component that may be playing a larger role than initially thought.  Plan -continue protonix -prn carafate -avoid NSAIDS  Uncontrolled type 2 diabetes mellitus. Review of UAs shows persistent proteinuria which I suspect is secondary to diabetic nephropathy. Previously was followed at [facility]. She was recommended metformin however refused due to not wanting to take medication. She has been severely hyperglycemic this admission. Weight based insulin requirement is 285. Due to my suspicion that she would have heavy insulin resistance and not previously on insulin, I started her on an insulin gtt yesterday in order to get a sense of what her insulin requirements will be.  -It appears that the gtt was started around 1600 last evening so I will plan to transition her to subcutaneous insulin at completion of 24h.  -will also start metformin if she is amenable this time -will also consider starting her on victoza prior to discharge but she will ultimately need to establish with a PCP  Normocytic anemia. Iron studies suggest a component of iron deficiency as well. Consider starting iron supplementation however suspect that her GI symptoms will not tolerate this.  Morbid obesity. Unfortunately, her long term prognosis is poor if obesity and subsequent  complications are not better managed. -will need referral to weight loss clinic at discharge  Gamma gap elevation--4.8. 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   Diet: Carb-Modified IVF: None VTE: Enoxaparin Code: Full  Elige Radon, MD Internal Medicine Resident PGY-2 [facility] Pager: [phone] Please contact the on call pager after 5 pm and on weekends at [phone]. 09/29/2020 11:28 AM

## 2020-09-29 NOTE — Discharge Summary (Addendum)
Name: Yvette Gibbs MRN: [id number] DOB: [birth year] 30 y.o. PCP: Pcp, No  Date of Admission: 09/28/2020  2:46 AM Date of Discharge: 10/08/2020 Attending Physician: Dr. Lalla Brothers  Discharge Diagnosis: 1. Acute hypoxic respiratory failure 2. Suspected OHS/OSA 3. Obstructive lung disease 4. Uncontrolled hypertension 5. Chronic abdominal pain 6. Uncontrolled type 2 diabetes mellitus 7. Normocytic anemia 8. Morbid obesity 9. Gamma gap elevation 10. NAFLD  Discharge Medications: Allergies as of 10/08/2020      Reactions   Lisinopril    Caused a rash and caused her to sweat a lot.   Fish Allergy Itching, Rash      Medication List    STOP taking these medications   amLODipine 5 MG tablet Commonly known as: NORVASC   ibuprofen 600 MG tablet Commonly known as: ADVIL   omeprazole 20 MG capsule Commonly known as: PRILOSEC   sucralfate 1 g tablet Commonly known as: Carafate Replaced by: sucralfate 1 GM/10ML suspension     TAKE these medications   Accu-Chek Guide Me w/Device Kit Use as directed to check blood sugar once daily.   Accu-Chek Guide test strip Generic drug: glucose blood Use as directed to check blood sugar once daily.   Accu-Chek Softclix Lancet Dev Kit Use as directed with lancets   Accu-Chek Softclix Lancets lancets Use as instructed   acetaminophen 500 MG tablet Commonly known as: TYLENOL Take 1,000 mg by mouth every 6 (six) hours as needed for mild pain or fever.   albuterol 108 (90 Base) MCG/ACT inhaler Commonly known as: VENTOLIN HFA Inhale 1-2 puffs into the lungs every 6 (six) hours as needed for wheezing or shortness of breath.   amLODipine-olmesartan 10-40 MG tablet Commonly known as: AZOR Take 1 tablet by mouth daily.   cyclobenzaprine 5 MG tablet Commonly known as: FLEXERIL Take 1 tablet (5 mg total) by mouth once as needed for muscle spasms.   fluticasone 50 MCG/ACT nasal spray Commonly known as: FLONASE Place 1 spray  into both nostrils daily. Start taking on: October 09, 2020   guaiFENesin-dextromethorphan 100-10 MG/5ML syrup Commonly known as: ROBITUSSIN DM Take 5 mLs by mouth every 4 (four) hours as needed for cough.   insulin glargine 100 UNIT/ML injection Commonly known as: LANTUS Inject 0.55 mLs (55 Units total) into the skin 2 (two) times daily.   loratadine 10 MG tablet Commonly known as: CLARITIN Take 10 mg by mouth daily as needed for allergies.   metFORMIN 500 MG tablet Commonly known as: GLUCOPHAGE Take 1 tablet (500 mg total) by mouth 2 (two) times daily with a meal. What changed: Another medication with the same name was removed. Continue taking this medication, and follow the directions you see here.   metoCLOPramide 5 MG tablet Commonly known as: REGLAN Take 1 tablet (5 mg total) by mouth 3 (three) times daily before meals.   ondansetron 4 MG disintegrating tablet Commonly known as: Zofran ODT Take 1 tablet (4 mg total) by mouth every 8 (eight) hours as needed for nausea or vomiting.   pantoprazole 40 MG tablet Commonly known as: PROTONIX Take 1 tablet (40 mg total) by mouth daily.   sucralfate 1 GM/10ML suspension Commonly known as: CARAFATE Take 10 mLs (1 g total) by mouth 4 (four) times daily -  with meals and at bedtime. Replaces: sucralfate 1 g tablet   Torsemide 40 MG Tabs Take 40 mg by mouth 2 (two) times daily.   Victoza 18 MG/3ML Sopn Generic drug: liraglutide Inject 0.6 mg into the  skin daily for 7 days, THEN 1.2 mg daily for 21 days. Start taking on: October 08, 2020            Durable Medical Equipment  (From admission, onward)         Start     Ordered   10/03/20 1600  For home use only DME oxygen  Once       Question Answer Comment  Length of Need 6 Months   Mode or (Route) Nasal cannula   Liters per Minute 2   Frequency Continuous (stationary and portable oxygen unit needed)   Oxygen conserving device Yes   Oxygen delivery system Gas       10/03/20 1600   10/03/20 1127  For home use only DME Bipap  Once       Question:  Length of Need  Answer:  Lifetime   10/03/20 1127   09/30/20 1612  For home use only DME Other see comment  Once       Comments: Needs Bariatric rollator. Weighs 418 lbs  Question:  Length of Need  Answer:  12 Months   09/30/20 1612          Disposition and follow-up:   Yvette Gibbs was discharged from [facility] in Stable condition.  At the hospital follow up visit please address:  Acute hypoxic respiratory failure 2/2 HFpEF Moderately severe obstructive airway disease Suspected OHS/OSA - Yvette Gibbs was discharged on 2 L home oxygen.  - She did NOT qualify for BiPap during this admission. Although she had random ABG 4/21 with pCO2 59.4 and ABG upon waking up 4/22 with pCO2 66.8, FEV1/FVC ratio obtained via inpatient spirometry on 10/07/20 showed FEV1/FVC ratio 23%.  - Weight on day of discharge: 169.5 kg - Serum creatinine on day of discharge: 1.18 New discharge medications: torsemide 40 mg twice daily Follow up recommendations - repeat ambulation with pulse oximetry - referral to pulmonology for further evaluation of obstructive lung disease and sleep study - check BMP, Mg at time of follow-up  Chronic Uncontrolled hypertension, Mild LVH New discharge medications: amlodipine-olmesartan 10-40 mg daily Follow up recommendations - check BMP at time of follow up - encourage weight loss  Chronic abdominal pain. Suspect this is multifactorial. History of diabetes increases risk for GERD and gastroparesis. - continue protonix 54m daily - prn carafate - Reglan 5 mg TID - work on glycemic control  Uncontrolled type 2 diabetes mellitus complicated by proteinuria. A1C 9.8%. Discharge medications: metformin 5069mtwice daily, lantus 55U BID, Victoza 0.71m59maily for 1 week and then 1.2mg7mereafter Follow up recommendations - titrate victoza and metformin as tolerated -  encourage medication adherence and provide education on the importance of glycemic control - place referral to ophthalmology for retinal screening - obtain urine microalbumin/creatinine ratio - consider the addition of an SGLT2  - repeat A1C in 3 months - consider statin therapy  Normocytic anemia - consider starting oral iron repletion if tolerated  Morbid obesity. This is most likely the underlying issue to the above issues. If there is not a significant change, her long term prognosis is poor given her morbid obesity.  NAFLD as noted on CT Follow up recommendations - consider referral to the weight loss center  - discuss bariatric surgery in the future  Gamma gap elevation. 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   Labs / imaging needed at time of follow-up: CBC, BMP  Pending labs/ test needing follow-up: none  Follow-up Appointments:  Follow-up Information    [facility]. Go to.   Why: your appointment is scheduled for Thursday, Oct 24, 2020 at 9:10am with Genice Rouge Contact information: [place] [id number] [place]. Schedule an appointment as soon as possible for a visit in 1 week(s).   Why: Someone will be calling you to schedule hospital follow-up. If you don't hear from anyone in 48 hrs, please give the office a call. Contact information: [address] [facility] Course by problem list:  Mindy Ellerbe is a 30 year old woman with a history of morbid obesity, type 2 diabetes (untreated), hypertension (untreated), and self-reported asthma who presented to [facility] ED on 09/28/20 for evaluation of abdominal pain and shortness of breath.   Acute hypoxic respiratory failure 2/2 HFpEF Moderately severe obstructive airway disease Suspected  OHS/OSA Patient presented with a 1-week history of worsening shortness of breath, associated with a productive cough with known sputum production, nausea, emesis, orthopnea, PND, weight gain. On exam patient is morbidly obese, diffuse wheezing in all lung fields, has 1+ bilateral pitting edema, with difficult to assess JVD. She was initially on 4 L nasal cannula with her oxygen saturation around 90-92%.  Patient has a self-reported history of asthma for which she occasionally will take albuterol and had an increased use prior to presentation. Chest x-ray showed bilateral pulmonary edema, with small lung fields.  Covid test negative.  BNP was 44 however patient is morbidly obese so this may be not a good indicator of heart failure.  EKG showed sinus tachycardia with no acute ST changes. She was treated in the ED with a dose of Solumedrol and treated with 40 mg IV Lasix. She was offered CPAP however initially refused because of uncomfortable mask. Echocardiogram performed in 2017 which showed severe thickening of the LV. Repeat echocardiogram limited due to sub-optimal windows, LVEF 55-60% with mild LVH. On 10/01/20, Ms. Chandran was noted to be drowsier with increased work of breathing, tachypnea, tachycardia, and increased oxygen requirement despite wearing CPAP overnight. Due to her worsening respiratory status, repeat VBG was obtained and was stable from prior. CXR with L>R pulmonary edema. In setting of LVH, patient's presentation consistent with diastolic heart failure. Diuresis was initiated, and patient had some improvement, however ultimately she was placed on BiPAP. She had subsequent improvement in her mentation and work of breathing with combination of diuresis and nightly use of BiPap. She was transitioned to torsemide 40 mg twice daily on 10/04/20. Ambulatory oxygen saturation: performed by RN and demonstrated 90% on room air at rest, 88% on room air with ambulating, 91% on 2 L while ambulating. Given her  marked improvement on nightly BiPap, we attempted to qualify patient for home BiPap under diagnosis of OHS. Random ABG 10/03/20 with pCO2 59.4. ABG on 10/04/20 upon wakening with pCO2 66.8. She was unable to complete spirometry on 10/04/20 due to dyspnea and wheezing, however was able to participate on 10/07/20. FEV1/FVC ratio obtained via inpatient spirometry on 10/07/20 showed FEV1/FVC ratio 23%, indicating moderately severe obstructive airway disease therefore was unable to qualify for home BiPap under diagnosis of OHS. Patient was monitored on overnight pulse oximetry 10/07/20-10/08/20 and because she did not have 5 cumulative minutes of SpO2 less than or equal to 88%, she could not qualify  for home BiPap under diagnosis of obstructive lung disease. Non-invasive ventilation was considered, however patient did not meet criteria. By the day of discharge, 10/08/20, patient was feeling significantly improved, and she was discharged home with her home 2 L oxygen.   Chronic Uncontrolled hypertension, Mild LVH Likely in the setting of untreated OSA/OHS. BP improved with diuresis. She was treated with amlodipine 10 mg and irbesartan 150 mg daily and discharged on combination amlodipine-olmesartan 10-40 mg.   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 A1C 9.8%. Previously prescribed metformin however did not pick up nor did she follow-up with primary care. Due to her elevated blood sugars (300s-400s) and difficulty estimating insulin needs (Weight based insulin requirement is 285), she was initially started on insulin drip.  24-hr insulin requirement on insulin drip was 204 units. Blood glucose improved though still not at goal with initiation of Lantus 50 units twice daily. Initial doses of prednisone likely contributed to resistance as well. Blood sugars were controlled on Lantus 55 units twice daily, Novolog 6 u WC, and metformin was titrated up to 500 mg twice daily. At discharge, she was  continued on Lantus and metformin. She was started on Novolog in lieu of mealtime insulin. She received diabetes education and tutorials on blood sugar monitoring and insulin administration.  Acute on Chronic abdominal pain Pt presented stating that she had been having LLQ and epigastric pain for the past 2 weeks. Pain was sharp/stabbing/pulling. Exacerbated by eating spicy foods and certain movements. Denied fever, chills, sick contacts or recent travel. She had presented to the ED 4x over a period of 10 days with similar symptoms. Suspect she has GERD vs PUD in the setting of body habitus and NSAID use. There was not an indication for urgent EGD, and she would be very high risk given her body habitus and untreated OSA/OHS. Gastroparesis is also a likely diagnosis in the setting of untreated diabetes. Gut edema also possibly contributing On chart review, it looks like she has been experiencing abdominal pain for a number of years, initially noted in 2016. Since then, she has had her gallbladder and appendix resected. During this admission, her pain was managed with combination of Protonix, metoclopramide, and sucralfate.  Normocytic anemia Normocytic anemia with Hgb 10.5 MCV 92.3 on admission appears to be her recent baseline. Last ferritin 05/25/20 was wnl at 140 in setting of COVID infection. Would expect iron deficiency anemia due to chronic blood loss in young woman. Kidney function appears normal. Iron studies (ferritin 78, iron 34, Tsat 7)  suggest a component of iron deficiency as well. Consider starting iron supplementation however suspect that her GI symptoms will not tolerate this. Hemoglobin 10 with MCV 88.7 on day of discharge.  Yeast infection Patient reported onset of vaginal pruritis without vaginal discharge. Urinalysis and wet prep from 09/17/20 ED visit with yeast, patient never received treatment. Uncontrolled diabetes and glucosuria likely contributing. G/C testing from that visit  negative. She was treated with fluconazole 150 mg x 2, 72 hours apart, and had resolution of her symptoms.   ~~~~~~~~~~~~~~~~~~~~~~~~~~~~~~~~~~~~~~~~~~~~~~~~~~~~~~~~~~~~~~~~~~~~~~~~~~~~~~~~~~~~~~~  Day of Discharge Subjective:  No acute events overnight. Patient was monitored on continuous pulse oximetry while on 2 L [place]. During evaluation this morning, patient states she is feeling well. States she is breathing comfortably and is ready to go home.  Discharge Exam:   BP 112/69 (BP Location: Right Wrist)   Pulse 92   Temp 98.8 F (37.1 C) (Oral)   Resp 17   Ht  _0  (1.575 m)   Wt (!) 169.5 kg   LMP 06/02/2020   SpO2 97%   BMI 68.33 kg/m   Constitutional: morbidly obese, sitting up in bed, awake and alert, journaling  Cardiac: regular rate and rhythm Pulm: normal work of breathing on 2 L [place], patient speaks in full sentences, diminished lung sounds throughout  Abdomen: soft, non-tender to palpation  Pertinent Labs, Studies, and Procedures:   09/28/20 DG Chest Port 1 View  CLINICAL DATA:  Shortness of breath EXAM: PORTABLE CHEST 1 VIEW COMPARISON:  05/21/2020 FINDINGS: Bilateral parahilar opacities with mild cardiomegaly. No pneumothorax or sizable pleural effusion. IMPRESSION: Cardiomegaly and bilateral parahilar opacities, likely pulmonary edema. Electronically Signed   By: Ulyses Jarred M.D.   On: 09/28/2020 03:21   09/29/20 ECHOCARDIOGRAM COMPLETE     ECHOCARDIOGRAM REPORT   Patient Name:   Fernanda Devery Date of Exam: 09/28/2020 Medical Rec #:  [id number]        Height:       62.0 in Accession #:    [id number]       Weight:       418.9 lb Date of Birth:  Jul 01, 1990        BSA:          2.617 m Patient Age:    29 years         BP:           143/66 mmHg Patient Gender: F                HR:           93 bpm. Exam Location:  Inpatient Procedure: 2D Echo, Cardiac Doppler and Color Doppler Indications:    Dyspnea R06.00  History:        Patient has prior history of Echocardiogram  examinations, most                 recent 09/02/2015. Risk Factors:Hypertension and Diabetes. Morbid                 Obesity, Hx of Acute respiratory failure due to COVID-19.  Sonographer:    Alvino Chapel RCS Referring Phys: [phone] [place]  Sonographer Comments: Suboptimal parasternal window, suboptimal apical window and suboptimal subcostal window. Image acquisition challenging due to patient body habitus. IMPRESSIONS  1. Left ventricular ejection fraction, by estimation, is 55 to 60%. The left ventricle has normal function. Left ventricular endocardial border not optimally defined to evaluate regional wall motion. There is mild left ventricular hypertrophy. Left ventricular diastolic parameters were normal.  2. Right ventricular systolic function was not well visualized. The right ventricular size is not well visualized.  3. The mitral valve was not well visualized. No evidence of mitral valve regurgitation. No evidence of mitral stenosis.  4. The aortic valve is tricuspid. Aortic valve regurgitation is not visualized. No aortic stenosis is present. FINDINGS  Left Ventricle: Technically difficult study, poor visualization even with the use of echocontrast. Left ventricular ejection fraction, by estimation, is 55 to 60%. The left ventricle has normal function. Left ventricular endocardial border not optimally  defined to evaluate regional wall motion. Definity contrast agent was given IV to delineate the left ventricular endocardial borders. The left ventricular internal cavity size was normal in size. There is mild left ventricular hypertrophy. Left ventricular diastolic parameters were normal. Right Ventricle: The right ventricular size is not well visualized. Right vetricular wall thickness was not well visualized. Right ventricular systolic function was not  well visualized. Left Atrium: Left atrial size was not well visualized. Right Atrium: Right atrial size was not well visualized. Pericardium:  There is no evidence of pericardial effusion. Mitral Valve: The mitral valve was not well visualized. No evidence of mitral valve regurgitation. No evidence of mitral valve stenosis. Tricuspid Valve: The tricuspid valve is not well visualized. Tricuspid valve regurgitation is not demonstrated. No evidence of tricuspid stenosis. Aortic Valve: The aortic valve is tricuspid. Aortic valve regurgitation is not visualized. No aortic stenosis is present. Aortic valve mean gradient measures 4.9 mmHg. Aortic valve peak gradient measures 10.1 mmHg. Aortic valve area, by VTI measures 1.70  cm. Pulmonic Valve: The pulmonic valve was not well visualized. Pulmonic valve regurgitation is not visualized. No evidence of pulmonic stenosis. Aorta: The aortic root is normal in size and structure. Pulmonary Artery: Indeterminant PASP, inadequate TR jet. IAS/Shunts: The interatrial septum was not well visualized.  LEFT VENTRICLE PLAX 2D LVIDd:         3.90 cm  Diastology LVIDs:         2.20 cm  LV e' medial:    11.60 cm/s LV PW:         1.30 cm  LV E/e' medial:  10.3 LV IVS:        1.50 cm  LV e' lateral:   15.40 cm/s LVOT diam:     1.90 cm  LV E/e' lateral: 7.8 LV SV:         47 LV SV Index:   18 LVOT Area:     2.84 cm  LEFT ATRIUM         Index LA diam:    3.00 cm 1.15 cm/m  AORTIC VALVE AV Area (Vmax):    1.90 cm AV Area (Vmean):   1.75 cm AV Area (VTI):     1.70 cm AV Vmax:           158.54 cm/s AV Vmean:          101.464 cm/s AV VTI:            0.279 m AV Peak Grad:      10.1 mmHg AV Mean Grad:      4.9 mmHg LVOT Vmax:         106.00 cm/s LVOT Vmean:        62.800 cm/s LVOT VTI:          0.167 m LVOT/AV VTI ratio: 0.60  AORTA Ao Root diam: 3.20 cm MITRAL VALVE MV Area (PHT): 2.34 cm     SHUNTS MV Decel Time: 324 msec     Systemic VTI:  0.17 m MV E velocity: 120.00 cm/s  Systemic Diam: 1.90 cm MV A velocity: 69.40 cm/s MV E/A ratio:  1.73 Carlyle Dolly MD Electronically signed by Carlyle Dolly MD Signature Date/Time:  09/29/2020/12:54:24 PM    Final    10/01/20 DG Chest Port 1 View COMPARISON:  09/28/2020  FINDINGS: Cardiac enlargement. Bilateral airspace disease left greater than right similar to the prior study. No effusion.  IMPRESSION: Bilateral airspace disease left greater than right likely edema. No interval change  Electronically Signed  By: Franchot Gallo M.D.   On: 10/01/2020 11:12  10/03/20 DG Chest Port 1 View COMPARISON:  Two days ago  FINDINGS: Cardiopericardial enlargement with hilar vascular fullness. Generalized interstitial opacity. No visible effusion or pneumothorax. Low volume chest  IMPRESSION: Generalized opacity favoring edema. No change when compared to 2 days prior.  Electronically Signed  By: Angelica Chessman  Watts M.D  On: 10/03/2020 11:09  Discharge Instructions: Discharge Instructions    ([place]) Call MD:  Anytime you have any of the following symptoms: 1) 3 pound weight gain in 24 hours or 5 pounds in 1 week 2) shortness of breath, with or without a dry hacking cough 3) swelling in the hands, feet or stomach 4) if you have to sleep on extra pillows at night in order to breathe.   Complete by: As directed    Call MD for:  difficulty breathing, headache or visual disturbances   Complete by: As directed    Call MD for:  extreme fatigue   Complete by: As directed    Call MD for:  persistant dizziness or light-headedness   Complete by: As directed    Call MD for:  persistant nausea and vomiting   Complete by: As directed    Call MD for:  severe uncontrolled pain   Complete by: As directed    Call MD for:  temperature >100.4   Complete by: As directed    Diet - low sodium heart healthy   Complete by: As directed    Face-to-face encounter (required for Medicare/Medicaid patients)   Complete by: As directed    I [place] Lodge certify that this patient is under my care and that I, or a nurse practitioner or physician's assistant working with me, had  a face-to-face encounter that meets the physician face-to-face encounter requirements with this patient on 10/08/2020. The encounter with the patient was in whole, or in part for the following medical condition(s) which is the primary reason for home health care (List medical condition): deconditioning   The encounter with the patient was in whole, or in part, for the following medical condition, which is the primary reason for home health care: deconditioning   I certify that, based on my findings, the following services are medically necessary home health services: Physical therapy   Reason for Medically Necessary Home Health Services: Skilled Nursing- Teaching of Disease Process/Symptom Management   My clinical findings support the need for the above services: Shortness of breath with activity   Further, I certify that my clinical findings support that this patient is homebound due to: Shortness of Breath with activity   Home Health   Complete by: As directed    To provide the following care/treatments: PT      Ms. Krahl,   It was a pleasure taking care of you in the hospital. 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 These problems put strain on your heart, leading to back up of fluid into your lungs and body. We treated you with medicine to help you pee, and you will continue on this fluid pill (torsemide) going forward. You qualified for home oxygen. This is just the beginning of your process to help with your breathing. In the near future you will need additional lung testing as well as possible sleep study.   We also worked on your diabetes regimen. You will continue taking Lantus 55 units twice daily and metformin 500 mg twice daily as started here in the hospital. I have sent a prescription for Victoza to your pharmacy. This is another diabetes medication  that will help with your blood sugar as well as weight loss. Please check your blood sugar before each meal and before bed, record the level, and bring to your hospital follow-up appointment.  Amlodipine-olmesartan is a combination pill for your high blood pressure.  We would like for you to follow-up in the Internal Medicine Clinic in the next week. You can decide at that time if you would like to establish primary care with [place] or if you would like to establish care with Juluis Mire at [place].  Take care!    Signed:  [place], MD Internal Medicine Resident, PGY-1 [facility] Pager: [phone] 2:05 PM, 10/08/2020

## 2020-09-29 NOTE — Progress Notes (Signed)
Blood tinged sputum and dry nose, humidified oxygen. L clavicular aching pain worse with palpation, MD notified. Administered tylenol. Ambulated to the chair with standby assist.

## 2020-09-29 NOTE — Progress Notes (Signed)
Patient refuses to wear CPAP tonight. RT tried to explain the benefits but pt still refused.

## 2020-09-30 DIAGNOSIS — B379 Candidiasis, unspecified: Secondary | ICD-10-CM

## 2020-09-30 DIAGNOSIS — R748 Abnormal levels of other serum enzymes: Secondary | ICD-10-CM

## 2020-09-30 DIAGNOSIS — J9601 Acute respiratory failure with hypoxia: Secondary | ICD-10-CM | POA: Diagnosis not present

## 2020-09-30 DIAGNOSIS — I110 Hypertensive heart disease with heart failure: Secondary | ICD-10-CM | POA: Diagnosis not present

## 2020-09-30 DIAGNOSIS — K760 Fatty (change of) liver, not elsewhere classified: Secondary | ICD-10-CM

## 2020-09-30 DIAGNOSIS — E662 Morbid (severe) obesity with alveolar hypoventilation: Secondary | ICD-10-CM | POA: Diagnosis not present

## 2020-09-30 DIAGNOSIS — D649 Anemia, unspecified: Secondary | ICD-10-CM | POA: Diagnosis not present

## 2020-09-30 DIAGNOSIS — I509 Heart failure, unspecified: Secondary | ICD-10-CM | POA: Diagnosis not present

## 2020-09-30 DIAGNOSIS — J45901 Unspecified asthma with (acute) exacerbation: Secondary | ICD-10-CM | POA: Diagnosis not present

## 2020-09-30 LAB — HEMOGLOBIN A1C
Hgb A1c MFr Bld: 10.9 % — ABNORMAL HIGH (ref 4.8–5.6)
Mean Plasma Glucose: 266 mg/dL

## 2020-09-30 LAB — BLOOD GAS, VENOUS
Acid-Base Excess: 9.8 mmol/L — ABNORMAL HIGH (ref 0.0–2.0)
Bicarbonate: 35.3 mmol/L — ABNORMAL HIGH (ref 20.0–28.0)
FIO2: 21
O2 Saturation: 88.4 %
Patient temperature: 36.9
pCO2, Ven: 62.2 mmHg — ABNORMAL HIGH (ref 44.0–60.0)
pH, Ven: 7.372 (ref 7.250–7.430)
pO2, Ven: 56.6 mmHg — ABNORMAL HIGH (ref 32.0–45.0)

## 2020-09-30 LAB — BASIC METABOLIC PANEL
Anion gap: 9 (ref 5–15)
BUN: 15 mg/dL (ref 6–20)
CO2: 31 mmol/L (ref 22–32)
Calcium: 8.6 mg/dL — ABNORMAL LOW (ref 8.9–10.3)
Chloride: 96 mmol/L — ABNORMAL LOW (ref 98–111)
Creatinine, Ser: 0.62 mg/dL (ref 0.44–1.00)
GFR, Estimated: >60 mL/min (ref >60)
Glucose, Bld: 278 mg/dL — ABNORMAL HIGH (ref 70–99)
Potassium: 4 mmol/L (ref 3.5–5.1)
Sodium: 136 mmol/L (ref 135–145)

## 2020-09-30 LAB — GLUCOSE, CAPILLARY
Glucose-Capillary: 201 mg/dL — ABNORMAL HIGH (ref 70–99)
Glucose-Capillary: 228 mg/dL — ABNORMAL HIGH (ref 70–99)
Glucose-Capillary: 273 mg/dL — ABNORMAL HIGH (ref 70–99)
Glucose-Capillary: 223 mg/dL — ABNORMAL HIGH (ref 70–99)

## 2020-09-30 MED ORDER — FLUCONAZOLE 150 MG PO TABS
150.0000 mg | ORAL_TABLET | Freq: Every day | ORAL | Status: AC
  Administered 2020-09-30: 150 mg via ORAL
  Filled 2020-09-30: qty 1

## 2020-09-30 MED ORDER — ALBUTEROL SULFATE (2.5 MG/3ML) 0.083% IN NEBU
2.5000 mg | INHALATION_SOLUTION | Freq: Four times a day (QID) | RESPIRATORY_TRACT | Status: DC | PRN
  Administered 2020-09-30: 2.5 mg via RESPIRATORY_TRACT
  Filled 2020-09-30: qty 3

## 2020-09-30 MED ORDER — INSULIN GLARGINE 100 UNIT/ML ~~LOC~~ SOLN
55.0000 [IU] | Freq: Two times a day (BID) | SUBCUTANEOUS | Status: DC
  Administered 2020-09-30 – 2020-10-04 (×8): 55 [IU] via SUBCUTANEOUS
  Filled 2020-09-30 (×10): qty 0.55

## 2020-09-30 MED ORDER — HYDROXYZINE HCL 10 MG PO TABS
10.0000 mg | ORAL_TABLET | Freq: Once | ORAL | Status: AC
  Administered 2020-09-30: 10 mg via ORAL
  Filled 2020-09-30: qty 1

## 2020-09-30 NOTE — TOC Initial Note (Signed)
Transition of Care [facility]) - Initial/Assessment Note    Patient Details  Name: Yvette Gibbs MRN: [id number] Date of Birth: Jun 04, 1991  Transition [facility] CM/SW Contact:    Durenda Guthrie, RN Phone Number: 09/30/2020, 12:15 PM  Clinical Narrative:  Case manager has arranged appointment for patient with [facility] for Thursday, Oct 24, 2020 at 9:10am with [name], NP. CM provided patient with this information and has placed appointment on AVS.      Expected Discharge Plan: Home/Self Care Barriers to Discharge: No Barriers Identified   Patient Goals and CMS Choice        Expected Discharge Plan and Services Expected Discharge Plan: Home/Self Care   Discharge Planning Services: CM Consult,Follow-up appt scheduled                                          Prior Living Arrangements/Services   Lives with:: Relatives                   Activities of Daily Living Home Assistive Devices/Equipment: None ADL Screening (condition at time of admission) Patient's cognitive ability adequate to safely complete daily activities?: Yes Is the patient deaf or have difficulty hearing?: No Does the patient have difficulty seeing, even when wearing glasses/contacts?: No Does the patient have difficulty concentrating, remembering, or making decisions?: No Patient able to express need for assistance with ADLs?: Yes Does the patient have difficulty dressing or bathing?: Yes Independently performs ADLs?: No Communication: Independent Dressing (OT): Needs assistance Is this a change from baseline?: Change from baseline, expected to last <3days Grooming: Independent Feeding: Independent Bathing: Needs assistance Is this a change from baseline?: Change from baseline, expected to last <3 days Toileting: Needs assistance Is this a change from baseline?: Change from baseline, expected to last <3 days In/Out Bed: Needs assistance Is this a change  from baseline?: Change from baseline, expected to last <3 days Walks in Home: Independent Does the patient have difficulty walking or climbing stairs?: No Weakness of Legs: None Weakness of Arms/Hands: None  Permission Sought/Granted                  Emotional Assessment       Orientation: : Oriented to Self,Oriented to Place,Oriented to  Time,Oriented to Situation Alcohol / Substance Use: Not Applicable Psych Involvement: No (comment)  Admission diagnosis:  Acute respiratory failure with hypoxia (HCC) [J96.01] Asthma exacerbation [J45.901] Patient Active Problem List   Diagnosis Date Noted  . Obesity hypoventilation syndrome (HCC) 09/29/2020  . Acute respiratory failure with hypoxia (HCC) 09/29/2020  . Normocytic anemia 09/29/2020  . Mild concentric left ventricular hypertrophy (LVH) 09/29/2020  . NAFLD (nonalcoholic fatty liver disease) 25/85/2778  . Asthma exacerbation 09/28/2020  . Acute respiratory failure due to COVID-19 (HCC) 05/21/2020  . Gastroesophageal reflux disease without esophagitis 06/19/2019  . Loud snoring 06/19/2019  . Type 2 diabetes mellitus without complication, without long-term current use of insulin (HCC) 03/04/2019  . Abdominal pain 03/03/2019  . HTN (hypertension) 03/03/2019  . Hyperglycemia 03/03/2019  . Proteinuria 03/03/2019  . HCAP (healthcare-associated pneumonia) 09/07/2015  . Severe sepsis (HCC) 09/07/2015  . Asthma 09/07/2015  . Pleuritic chest pain 09/01/2015  . Morbid obesity (HCC) 09/01/2015  . OSA (obstructive sleep apnea) 09/01/2015  . Influenza A 09/01/2015  . Biliary colic 09/04/2014   PCP:  Pcp, No Pharmacy:   [id number] [place], [place] [address] AT [facility] [address] [place] [address] Phone: [phone] Fax: [phone]     Social Determinants of Health (SDOH) Interventions    Readmission Risk Interventions No flowsheet data found.

## 2020-09-30 NOTE — Progress Notes (Signed)
RT placed patient on CPAP with 4L O2 bled into circuit.  Patient tolerating CPAP well at this time.  SPO2 96%.  No respiratory distress noted.  RT will monitor as needed.

## 2020-09-30 NOTE — Progress Notes (Addendum)
Endotool recommends notifying MD for transition orders, recommending every 2 hour fingerstick blood glucose checks at this time. Spoke with Dr. Elaina Pattee, orders received to continue to insulin gtt and continue with titrating insulin gtt per Endotool recommendations. Current CBG is 162. Will medicate as ordered and continue to monitor.

## 2020-09-30 NOTE — Progress Notes (Signed)
Patient complaining of vaginal itching, states "I think I have a yeast infection". Peri care and anti-fungal powder applied to peri area, some relief obtained but relief did not last long. Patient requesting something for yeast infection/itching. Paged MD and spoke with Dr. Evie Lacks, MD notified of patient's requests and symptoms, orders received to medicate with PO hydroxyzine. Patient medicated, peri care performed again and barrier cream applied to peri area per patient request. Purewick removed and patient educated on purewick should not be used at this time and instructed to call for assistance to bathroom when needed, she verbalizes understanding. Will continue to monitor.

## 2020-09-30 NOTE — Progress Notes (Signed)
Patient complaining of back spasms, states she has this type of pain at home and usually takes "ibuprofen and flexeril". Patient medicated with prn tylenol, spoke with Dr. Evie Lacks, MD notified of patient complaint. Orders received to continue with current pain regimen and notify if pain worsens/pain regimen does not help. Will continue to monitor.

## 2020-09-30 NOTE — Progress Notes (Signed)
Patient having increased blood pressure, current reading 137/107 but previous reading at 0000 was 175/96. Patient complaining of headache 4/10. Spoke with Dr. Elaina Pattee, orders received to give additional 5mg  of amlodipine and patient will be started on 10mg  of amlodipine in AM. Will medicate as ordered and continue to monitor.

## 2020-09-30 NOTE — Progress Notes (Signed)
Inpatient Diabetes Program Recommendations  AACE/ADA: New Consensus Statement on Inpatient Glycemic Control   Target Ranges:  Prepandial:   less than 140 mg/dL      Peak postprandial:   less than 180 mg/dL (1-2 hours)      Critically ill patients:  140 - 180 mg/dL     Review of Glycemic Control Results for Yvette Gibbs, Yvette Gibbs (MRN [id number]) as of 09/30/2020 14:35  Ref. Range 09/29/2020 17:07 09/29/2020 18:31 09/29/2020 20:20 09/30/2020 07:52 09/30/2020 11:23  Glucose-Capillary Latest Ref Range: 70 - 99 mg/dL 470 (H) 962 (H) 836 (H) 201 (H) 228 (H)   Diabetes history: DM2  Outpatient Diabetes medications: None Current orders for Inpatient glycemic control:  Lantus 50 units bid Metformin 500 mg Daily Novolog 0-20 units tid + hs  Inpatient Diabetes Program Recommendations:    Insulin: Consider increasing Lantus to 55 units bid  Thanks, Christena Deem RN, MSN, BC-ADM Inpatient Diabetes Coordinator Team Pager [phone] (8a-5p)

## 2020-09-30 NOTE — Evaluation (Signed)
Physical Therapy Evaluation Patient Details Name: Yvette Gibbs MRN: [id number] DOB: 13-Jul-1990 Today's Date: 09/30/2020   History of Present Illness  30 year female  Presents to ED 09/26/20 with shortness of breath and abdominal pain. Admitted for treatment of Acute hypoxic respiratory failure PMH: history of asthma, depression, HTN, recent COVID infection, obesity, and diabetes  Clinical Impression  PTA COVID pt was completely independent with community ambulation and iADLs. Since COVID pt only able to ambulate household distances before experiencing 4/4 DoE requiring standing rest breaks. Pt requires increased time and effort for bathing and dressing, and iADLs. Pt is currently living with family in single level apartment with 7 steps to enter. Pt is currently limited in safe mobility by increased O2 demand, increase DoE, decreased ROM due to body habitus and generalized weakness. Educated pt on effects of long COVID and need for increased recovery time from activity. Pt educated on energy conservation and appropriate exercise to tolerance. Pt is supervision for bed mobility, and min guard for transfers and ambulation 18 feet without AD. PT recommending HHPT for improving endurance and strength. PT will continue to follow acutely.     Follow Up Recommendations Home health PT;Supervision for mobility/OOB    Equipment Recommendations  Other (comment) (Bariatric Rollator)    Recommendations for Other Services OT consult     Precautions / Restrictions Precautions Precautions: None Restrictions Weight Bearing Restrictions: No      Mobility  Bed Mobility Overal bed mobility: Needs Assistance Bed Mobility: Supine to Sit;Sit to Supine     Supine to sit: Supervision Sit to supine: Supervision   General bed mobility comments: supervision for safety    Transfers Overall transfer level: Needs assistance   Transfers: Sit to/from Stand Sit to Stand: Min guard         General  transfer comment: min guard for safety for power up and steadying from recliner, BSC and bed  Ambulation/Gait Ambulation/Gait assistance: Min guard Gait Distance (Feet): 18 Feet Assistive device: None Gait Pattern/deviations: Step-through pattern;Decreased step length - right;Decreased step length - left;Wide base of support Gait velocity: slowed Gait velocity interpretation: <1.31 ft/sec, indicative of household ambulator General Gait Details: min guard for safety with slow, waddling gait 4/4 DoE with ambulation requires 8 min to recover breath        Balance Overall balance assessment: Needs assistance Sitting-balance support: Feet supported;No upper extremity supported Sitting balance-Leahy Scale: Fair     Standing balance support: Single extremity supported;Bilateral upper extremity supported;No upper extremity supported;During functional activity Standing balance-Leahy Scale: Fair                               Pertinent Vitals/Pain Pain Assessment: Faces Faces Pain Scale: Hurts a little bit Pain Location: headache Pain Descriptors / Indicators: Headache Pain Intervention(s): Limited activity within patient's tolerance;Monitored during session    Home Living Family/patient expects to be discharged to:: Private residence Living Arrangements: Other relatives Available Help at Discharge: Family;Available 24 hours/day Type of Home: Apartment Home Access: Stairs to enter Entrance Stairs-Rails: Right Entrance Stairs-Number of Steps: 7 Home Layout: One level Home Equipment: None      Prior Function Level of Independence: Independent         Comments: independent requiring increased     Hand Dominance   Dominant Hand: Right    Extremity/Trunk Assessment   Upper Extremity Assessment Upper Extremity Assessment: RUE deficits/detail;LUE deficits/detail RUE Deficits / Details: ROM limited by  body habitus RUE Coordination: decreased fine motor LUE  Deficits / Details: ROM limited by body habitus LUE Coordination: decreased fine motor    Lower Extremity Assessment Lower Extremity Assessment: RLE deficits/detail;LLE deficits/detail RLE Deficits / Details: ROM limited by body habitus RLE Coordination: decreased fine motor LLE Deficits / Details: ROM limited by body habitus LLE Coordination: decreased fine motor       Communication   Communication: No difficulties  Cognition Arousal/Alertness: Awake/alert Behavior During Therapy: Flat affect Overall Cognitive Status: Within Functional Limits for tasks assessed                                        General Comments General comments (skin integrity, edema, etc.): Pt on 3L O2 via [place] at rest SaO2 98%O2, after ambulation 90%O2, max noted HR 132 bpm        Assessment/Plan    PT Assessment Patient needs continued PT services  PT Problem List Decreased strength;Decreased range of motion;Decreased activity tolerance;Decreased mobility;Cardiopulmonary status limiting activity;Pain;Obesity       PT Treatment Interventions DME instruction;Gait training;Stair training;Functional mobility training;Therapeutic activities;Therapeutic exercise;Balance training;Cognitive remediation;Patient/family education    PT Goals (Current goals can be found in the Care Plan section)  Acute Rehab PT Goals Patient Stated Goal: go home to children PT Goal Formulation: With patient Time For Goal Achievement: 10/14/20 Potential to Achieve Goals: Fair    Frequency Min 3X/week   Barriers to discharge Inaccessible home environment         AM-PAC PT "6 Clicks" Mobility  Outcome Measure Help needed turning from your back to your side while in a flat bed without using bedrails?: None Help needed moving from lying on your back to sitting on the side of a flat bed without using bedrails?: None Help needed moving to and from a bed to a chair (including a wheelchair)?: None Help needed  standing up from a chair using your arms (e.g., wheelchair or bedside chair)?: None Help needed to walk in hospital room?: None Help needed climbing 3-5 steps with a railing? : A Little 6 Click Score: 23    End of Session Equipment Utilized During Treatment: Oxygen Activity Tolerance: Patient limited by fatigue Patient left: in bed;with call bell/phone within reach;Other (comment) (sitting EoB eating lunch) Nurse Communication: Mobility status PT Visit Diagnosis: Other abnormalities of gait and mobility (R26.89);Muscle weakness (generalized) (M62.81);Difficulty in walking, not elsewhere classified (R26.2);Pain Pain - part of body:  (headache)    Time: 1749-4496 PT Time Calculation (min) (ACUTE ONLY): 55 min   Charges:   PT Evaluation $PT Eval Moderate Complexity: 1 Mod PT Treatments $Gait Training: 8-22 mins $Therapeutic Activity: 8-22 mins        Desarai Barrack B. Beverely Risen PT, DPT Acute Rehabilitation Services Pager [phone] Office [phone]   Elon Alas Fleet 09/30/2020, 1:06 PM

## 2020-09-30 NOTE — Progress Notes (Signed)
  RD consulted for nutrition education regarding diabetes.    Lab Results  Component Value Date   HGBA1C 10.9 (H) 09/26/2020    RD provided "Carbohydrate Counting for People with Diabetes" handout from the Academy of Nutrition and Dietetics. Discussed different food groups and their effects on blood sugar, emphasizing carbohydrate-containing foods. Provided list of carbohydrates and recommended serving sizes of common foods.  Discussed importance of controlled and consistent carbohydrate intake throughout the day. Provided examples of ways to balance meals/snacks and encouraged intake of high-fiber, whole grain complex carbohydrates. Teach back method used.  Expect good compliance. Patient asked appropriate questions.  Body mass index is 76.61 kg/m. Pt meets criteria for morbid obesity based on current BMI.  Current diet order is carbohydrate modified, patient is consuming approximately 100% of meals at this time. Labs and medications reviewed. No further nutrition interventions warranted at this time. RD contact information provided. If additional nutrition issues arise, please re-consult RD.  Gabriel Rainwater, RD, LDN, CNSC Please refer to [facility] for contact information.

## 2020-09-30 NOTE — Progress Notes (Signed)
ABG completed this morning. Patient refused CPAP at tonight, currently wearing 3L oxygen via nasal cannula with oxygen saturations of 93-94%. Patient denies having any complaints of shortness of breath. Dr. Evie Lacks notified of results, no change in current plan of care received at this time. Will continue with current regimen and continue to monitor.

## 2020-09-30 NOTE — Progress Notes (Signed)
HD#2 Subjective:   Overnight, patient reported back spasms, vaginal itching. Refused CPAP.  During evaluation this morning, patient states that she is doing "ok," endorses mild headache. She is sitting on recliner chair during examination. She reports onset of vaginal pruritis with using Purewick, is unsure about vaginal discharge. Denies abdominal or under the breast pain. Last STI check was 4/5 and was positive for yeast. Patient reports she never received treatment. She has never had a yeast infection. States she is willing to use the Cpap machine if there is a different mask. She expresses commitment to working on her health so as to be around for her children.  Objective:   Vital signs in last 24 hours: Vitals:   09/29/20 2042 09/30/20 0042 09/30/20 0202 09/30/20 0453  BP: 127/66   114/69  Pulse: 96 97  89  Resp: 18  20 20   Temp: 97.6 F (36.4 C)   98.7 F (37.1 C)  TempSrc: Oral   Oral  SpO2: 94% 97%  96%  Weight:      Height:       Supplemental O2: Nasal Cannula SpO2: 90 % O2 Flow Rate (L/min): 2 L/min FiO2 (%): 32 %  Physical Exam Constitutional: morbidly obese, no acute distress Cardiac: RRR Pulm: distant lung sounds, O2 sats >90% on 2 L [place] Neuro: awake, alert, and oriented x 3 Mood: flat affect  Filed Weights   09/28/20 0250 09/28/20 0849  Weight: (!) 190 kg (!) 190 kg    Intake/Output Summary (Last 24 hours) at 09/30/2020 0732 Last data filed at 09/30/2020 0555 Gross per 24 hour  Intake 1244.12 ml  Output 1000 ml  Net 244.12 ml   Net IO Since Admission: -695.88 mL [09/30/20 0732]   Assessment/Plan:   Principal Problem:   Acute respiratory failure with hypoxia (HCC) Active Problems:   Morbid obesity (HCC)   OSA (obstructive sleep apnea)   Abdominal pain   HTN (hypertension)   Proteinuria   Type 2 diabetes mellitus without complication, without long-term current use of insulin (HCC)   Gastroesophageal reflux disease without esophagitis    Asthma exacerbation   Obesity hypoventilation syndrome (HCC)   Normocytic anemia   Mild concentric left ventricular hypertrophy (LVH)   NAFLD (nonalcoholic fatty liver disease)   Patient Summary:  Yvette Gibbs is a 30 year old woman with a history asthma, depression, HTN, recent COVID infection, morbid obesity, and diabetes who presented with shortness of breath and abdominal pain and admitted for acute hypoxic respiratory failure which is suspected to be secondary to untreated OSA/OHS.  This is hospital day 2. Anticipate discharge in the next 1-2 days.  Acute hypoxic respiratory failure Suspected underlying OHS/OSA Patient reports subjective improvement in her breathing today. Refused CPAP overnight due to uncomfortable mask. Oxygen weaned from 4 L to 2 L [place] over the last day. VBG this morning with pH 7.37, pCO2 62.2. BMP with bicarb 31 improved from 35 yesterday. Echocardiogram obtained on admission limited due to sub-optimal windows, LVEF 55-60% with mild LVH. - wean supplemental O2 as able, though suspect patient will qualify for home oxygen - CPAP QHS, patient to try different mask - will need outpatient sleep study and PFTs after discharge - PT recommend [facility] PT, bariatric rollator  Uncontrolled HTN. Likely in the setting of untreated OSA/OHS. BP improved with addition of irbesartan yesterday. - continue irbasartan 150mg  daily - continue amlodipine - consider transition to olmesartan-amlodipine at discharge (on medicaid formulary) if patient is willing to start  Uncontrolled  type 2 diabetes mellitus Weight based insulin requirement is 285. 24-hr insulin requirement on insulin drip was 204 units. Blood glucose improved though still not at goal with initiation of Lantus 50 units twice daily. Initial doses of prednisone likely contributed to resistance as well. - Increase Lantus to 55 u BID - metformin 500 mg daily, titrate further with PCP - consider initiation of Victoza prior to  discharge vs initiation with PCP   Yeast infection Patient reports onset of vaginal pruritis overnight. Denies vaginal discharge. Urinalysis and wet prep from 09/17/20 ED visit with yeast, patient never received treatment. Uncontrolled diabetes and glucosuria likely contributing. G/C testing from that visit negative. - fluconazole 150 mg today, consider additional 150 mg dose if symptoms not improved in 72 hours  Abdominal pain Improving. Suspect GERD vs PUD in the setting of body habitus and NSAID use. Do not think there is an indication for an urgent EGD at this time and she would be very high risk given her body habitus and untreated OSA/OHS. Gastroparesis is also a possibility in the setting of untreated diabetes.  - continue protonix 40 mg daily, metoclopramide 5 mg TID - sucralfate 1g TID  - avoid NSAIDS  Normocytic anemia. Iron studies suggest a component of iron deficiency as well. Consider starting iron supplementation however suspect that her GI symptoms will not tolerate this.  Morbid obesity. Unfortunately, her long term prognosis is poor if obesity and subsequent complications are not better managed. - referral to weight loss clinic at discharge  Gamma gap elevation--4.8. 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   Diet: Carb-Modified IVF: None VTE: Enoxaparin Code: Full  Alphonzo Severance, MD Internal Medicine Resident, PGY-1 [facility] Pager: [phone] 7:32 AM, 09/30/2020

## 2020-10-01 ENCOUNTER — Encounter (HOSPITAL_COMMUNITY): Payer: Self-pay | Admitting: Internal Medicine

## 2020-10-01 ENCOUNTER — Inpatient Hospital Stay (HOSPITAL_COMMUNITY): Payer: Medicaid Other

## 2020-10-01 DIAGNOSIS — R1030 Lower abdominal pain, unspecified: Secondary | ICD-10-CM | POA: Diagnosis not present

## 2020-10-01 DIAGNOSIS — I110 Hypertensive heart disease with heart failure: Secondary | ICD-10-CM | POA: Diagnosis not present

## 2020-10-01 DIAGNOSIS — J9601 Acute respiratory failure with hypoxia: Secondary | ICD-10-CM | POA: Diagnosis not present

## 2020-10-01 DIAGNOSIS — I5030 Unspecified diastolic (congestive) heart failure: Secondary | ICD-10-CM

## 2020-10-01 DIAGNOSIS — G4733 Obstructive sleep apnea (adult) (pediatric): Secondary | ICD-10-CM | POA: Diagnosis not present

## 2020-10-01 DIAGNOSIS — R1013 Epigastric pain: Secondary | ICD-10-CM | POA: Diagnosis not present

## 2020-10-01 DIAGNOSIS — B379 Candidiasis, unspecified: Secondary | ICD-10-CM | POA: Diagnosis not present

## 2020-10-01 DIAGNOSIS — I5033 Acute on chronic diastolic (congestive) heart failure: Secondary | ICD-10-CM

## 2020-10-01 DIAGNOSIS — Z8616 Personal history of COVID-19: Secondary | ICD-10-CM | POA: Diagnosis not present

## 2020-10-01 DIAGNOSIS — D649 Anemia, unspecified: Secondary | ICD-10-CM | POA: Diagnosis not present

## 2020-10-01 DIAGNOSIS — J45901 Unspecified asthma with (acute) exacerbation: Secondary | ICD-10-CM | POA: Diagnosis not present

## 2020-10-01 DIAGNOSIS — J9690 Respiratory failure, unspecified, unspecified whether with hypoxia or hypercapnia: Secondary | ICD-10-CM | POA: Diagnosis not present

## 2020-10-01 LAB — BASIC METABOLIC PANEL
Anion gap: 5 (ref 5–15)
BUN: 10 mg/dL (ref 6–20)
CO2: 37 mmol/L — ABNORMAL HIGH (ref 22–32)
Calcium: 8.7 mg/dL — ABNORMAL LOW (ref 8.9–10.3)
Chloride: 94 mmol/L — ABNORMAL LOW (ref 98–111)
Creatinine, Ser: 0.54 mg/dL (ref 0.44–1.00)
GFR, Estimated: >60 mL/min (ref >60)
Glucose, Bld: 167 mg/dL — ABNORMAL HIGH (ref 70–99)
Potassium: 3.7 mmol/L (ref 3.5–5.1)
Sodium: 136 mmol/L (ref 135–145)

## 2020-10-01 LAB — BLOOD GAS, VENOUS
Acid-Base Excess: 10.7 mmol/L — ABNORMAL HIGH (ref 0.0–2.0)
Bicarbonate: 35.8 mmol/L — ABNORMAL HIGH (ref 20.0–28.0)
FIO2: 32
O2 Saturation: 84.6 %
Patient temperature: 37
pCO2, Ven: 58.3 mmHg (ref 44.0–60.0)
pH, Ven: 7.405 (ref 7.250–7.430)
pO2, Ven: 51.1 mmHg — ABNORMAL HIGH (ref 32.0–45.0)

## 2020-10-01 LAB — CBC WITH DIFFERENTIAL/PLATELET
Abs Immature Granulocytes: 0.07 10*3/uL (ref 0.00–0.07)
Basophils Absolute: 0 10*3/uL (ref 0.0–0.1)
Basophils Relative: 0 %
Eosinophils Absolute: 0.1 10*3/uL (ref 0.0–0.5)
Eosinophils Relative: 1 %
HCT: 33.6 % — ABNORMAL LOW (ref 36.0–46.0)
Hemoglobin: 9.6 g/dL — ABNORMAL LOW (ref 12.0–15.0)
Immature Granulocytes: 1 %
Lymphocytes Relative: 22 %
Lymphs Abs: 2.1 10*3/uL (ref 0.7–4.0)
MCH: 26.1 pg (ref 26.0–34.0)
MCHC: 28.6 g/dL — ABNORMAL LOW (ref 30.0–36.0)
MCV: 91.3 fL (ref 80.0–100.0)
Monocytes Absolute: 1.2 10*3/uL — ABNORMAL HIGH (ref 0.1–1.0)
Monocytes Relative: 12 %
Neutro Abs: 6.3 10*3/uL (ref 1.7–7.7)
Neutrophils Relative %: 64 %
Platelets: 248 10*3/uL (ref 150–400)
RBC: 3.68 MIL/uL — ABNORMAL LOW (ref 3.87–5.11)
RDW: 15.4 % (ref 11.5–15.5)
WBC: 9.8 10*3/uL (ref 4.0–10.5)
nRBC: 0 % (ref 0.0–0.2)

## 2020-10-01 LAB — GLUCOSE, CAPILLARY
Glucose-Capillary: 173 mg/dL — ABNORMAL HIGH (ref 70–99)
Glucose-Capillary: 245 mg/dL — ABNORMAL HIGH (ref 70–99)
Glucose-Capillary: 268 mg/dL — ABNORMAL HIGH (ref 70–99)
Glucose-Capillary: 251 mg/dL — ABNORMAL HIGH (ref 70–99)

## 2020-10-01 MED ORDER — IPRATROPIUM-ALBUTEROL 0.5-2.5 (3) MG/3ML IN SOLN: 3.0000 mL | Freq: Once | RESPIRATORY_TRACT | Status: DC

## 2020-10-01 MED ORDER — INSULIN ASPART 100 UNIT/ML ~~LOC~~ SOLN
4.0000 [IU] | Freq: Three times a day (TID) | SUBCUTANEOUS | Status: DC
  Administered 2020-10-01 – 2020-10-02 (×4): 4 [IU] via SUBCUTANEOUS

## 2020-10-01 MED ORDER — FUROSEMIDE 10 MG/ML IJ SOLN
40.0000 mg | Freq: Once | INTRAMUSCULAR | Status: AC
  Administered 2020-10-01: 40 mg via INTRAVENOUS
  Filled 2020-10-01: qty 4

## 2020-10-01 MED ORDER — IPRATROPIUM-ALBUTEROL 0.5-2.5 (3) MG/3ML IN SOLN
3.0000 mL | RESPIRATORY_TRACT | Status: DC | PRN
  Administered 2020-10-04: 3 mL via RESPIRATORY_TRACT
  Filled 2020-10-01 (×4): qty 3

## 2020-10-01 NOTE — Progress Notes (Signed)
Episodes of tachypnea this shift improved with lasix and PRN duonebs. Charge Nurse, RRT and RT notified to keep patient on their radar. Continues to verbalize not being able to tolerate the CPAP whenever she falls asleep. Total urine output on day shift 3100.

## 2020-10-01 NOTE — Progress Notes (Deleted)
   10/01/20 1020  Assess: MEWS Score  Temp 99.4 F (37.4 C)  BP (!) 154/97  Pulse Rate 100  Resp (!) 42  Level of Consciousness Alert  SpO2 93 %  O2 Device Nasal Cannula  O2 Flow Rate (L/min) 3 L/min  Assess: MEWS Score  MEWS Temp 0  MEWS Systolic 0  MEWS Pulse 0  MEWS RR 3  MEWS LOC 0  MEWS Score 3  MEWS Score Color Yellow  Assess: if the MEWS score is Yellow or Red  Were vital signs taken at a resting state? Yes  Focused Assessment Change from prior assessment (see assessment flowsheet)  Early Detection of Sepsis Score *See Row Information* Low  MEWS guidelines implemented *See Row Information* Yes  Treat  MEWS Interventions Administered scheduled meds/treatments;Administered prn meds/treatments;Escalated (See documentation below)  Take Vital Signs  Increase Vital Sign Frequency  Yellow: Q 2hr X 2 then Q 4hr X 2, if remains yellow, continue Q 4hrs  Escalate  MEWS: Escalate Yellow: discuss with charge nurse/RN and consider discussing with provider and RRT  Notify: Charge Nurse/RN  Name of Charge Nurse/RN Notified Liana  Date Charge Nurse/RN Notified 10/01/20  Time Charge Nurse/RN Notified 1054  Notify: Provider  Provider Name/Title Alphonzo Severance  Date Provider Notified 10/01/20  Time Provider Notified 1020  Notification Type Page  Notification Reason Change in status  Provider response No new orders (continue to observe after meds)  Date of Provider Response 10/01/20  Time of Provider Response 1022  Document  Patient Outcome Stabilized after interventions  Progress note created (see row info) Yes   Unable to finish sentences or stay upright d/t above RR. Administered lasix and duoneb with improvement. Increased pulmonary edema per MD. Will continue to monitor.

## 2020-10-01 NOTE — Progress Notes (Addendum)
HD#3 Subjective:   No acute events overnight.  During evaluation this morning, patient working with OT upon our arrival to room. OT reports patient has been sitting in bedside chair during entirety of session and has been sleeping and with increased work of breathing. O2 saturation mid 80s on 2 L, max 93% on 3 L [place]. Ms. Wingert states she is doing "alright." Notes she feels sleepy, "like I took a sleeping pill when I woke up." She denies chest pain. Endorses epigastric tenderness.   Objective:   Vital signs in last 24 hours: Vitals:   09/30/20 1119 09/30/20 2043 09/30/20 2226 10/01/20 0437  BP: (!) 139/98 (!) 135/101  123/72  Pulse: 95 100 (!) 102 100  Resp: 20 20 20 20   Temp: 98.4 F (36.9 C) 98.9 F (37.2 C)  99.7 F (37.6 C)  TempSrc: Oral Oral  Oral  SpO2: 90% 96% 96% 100%  Weight:      Height:       Supplemental O2: Nasal Cannula SpO2: 91 % O2 Flow Rate (L/min): 3 L/min FiO2 (%): 32 %  Physical Exam Constitutional: sleepy, morbidly obese,  Cardiac: tachycardic, regular rhythm Pulm: increased work of breathing, tachypneic; diminished lung sounds in the lower lung fields, crackles in the mid lung fields with occasional end-expiratory wheeze, O2 sats 89-93% on 3L [place] Extremities: legs are tighter than before, 1+ pitting edema to the knees bilaterally Abdominal: mild tenderness to palpation of epigastrum  Neuro: oriented x 3 Mood: flat affect  Filed Weights   09/28/20 0250 09/28/20 0849  Weight: (!) 190 kg (!) 190 kg    Intake/Output Summary (Last 24 hours) at 10/01/2020 0656 Last data filed at 10/01/2020 0600 Gross per 24 hour  Intake 360 ml  Output 1050 ml  Net -690 ml   Net IO Since Admission: -1,385.88 mL [10/01/20 0656]   Assessment/Plan:   Principal Problem:   Heart failure with preserved ejection fraction (HCC) Active Problems:   Morbid obesity (HCC)   OSA (obstructive sleep apnea)   Abdominal pain   HTN (hypertension)   Proteinuria   Type 2  diabetes mellitus without complication, without long-term current use of insulin (HCC)   Gastroesophageal reflux disease without esophagitis   Asthma exacerbation   Obesity hypoventilation syndrome (HCC)   Acute respiratory failure with hypoxia (HCC)   Normocytic anemia   Mild concentric left ventricular hypertrophy (LVH)   NAFLD (nonalcoholic fatty liver disease)   Patient Summary:  Yvette Gibbs is a 30 year old woman with a history asthma, depression, HTN, recent COVID infection, morbid obesity, and diabetes who presented with shortness of breath and abdominal pain and admitted for acute hypoxic respiratory failure which is suspected to be secondary to possible diastolic heart failure from untreated OSA/OHS  This is hospital day 3.   Acute hypoxic respiratory failure 2/2 diastolic heart failure Suspected underlying OHS/OSA Patient drowsier this morning with increased work of breathing, tachypnea, tachycardia, and increased oxygen requirement at 3 L this morning after being weaned to 2 L yesterday. Wore CPAP overnight. Physical exam this morning significant for further diminished breath sounds and crackles. Assessment of volume status is complicated by body habitus.  BMP this morning with bicarb 37 from 31 yesterday. Due to her worsening respiratory status, obtained repeat VBG which was stable from yesterday. CXR with L>R pulmonary edema. In setting of LVH, patient's presentation consistent with diastolic heart failure. Will diurese as kidney function and BP tolerate. If she remains tachycardic despite diuresis, will consider CT  angio chest PE protocol to rule out PE. - Lasix 40 mg IV BID - wean supplemental O2 as able, though suspect patient will qualify for home oxygen - CPAP QHS - will need outpatient sleep study and PFTs after discharge - PT recommend HH PT, bariatric rollator  Uncontrolled HTN. Likely in the setting of untreated OSA/OHS. BP improved with addition of irbesartan,  however pressures more elevated overnight and today, suspect due to hypervolemia.  - Diuresis as above - continue irbasartan 150mg  daily, amlodipine 10 mg daily - consider transition to olmesartan-amlodipine at discharge (on medicaid formulary) if patient is willing to start  Uncontrolled type 2 diabetes mellitus Blood glucose improved after increasing Lantus from 55 u BID to 55 u BID.  - Continue Lantus 55 u BID - Add Novolog 4 u WC, appreciate diabetes coordinator's recs - SSI resistant - metformin 500 mg daily, titrate further with PCP - consider initiation of Victoza prior to discharge vs initiation with PCP   Yeast infection -  fluconazole 150 mg yesterday, consider additional 150 mg dose if symptoms not improved tomorrow  Abdominal pain Stable. Suspect GERD vs PUD in the setting of body habitus and NSAID use. Do not think there is an indication for an urgent EGD at this time and she would be very high risk given her body habitus and untreated OSA/OHS. Gastroparesis is also a possibility in the setting of untreated diabetes.  - continue protonix 40 mg daily, metoclopramide 5 mg TID - sucralfate 1g TID  - avoid NSAIDS  Normocytic anemia. Hgb 9.6 this morning, down from 11 yesterday. No signs of blood loss at this time. Will monitor closely. Iron studies suggest a component of iron deficiency as well. Consider starting iron supplementation however suspect that her GI symptoms will not tolerate this. - AM CBC  Morbid obesity. Unfortunately, her long term prognosis is poor if obesity and subsequent complications are not better managed. - referral to weight loss clinic at discharge   Diet: Carb-Modified IVF: None VTE: Enoxaparin Code: Full  , MD Internal Medicine Resident, PGY-1 [facility] Pager: [phone] 6:56 AM, 10/01/2020

## 2020-10-01 NOTE — Evaluation (Signed)
Occupational Therapy Evaluation Patient Details Name: Yvette Gibbs MRN: [id number] DOB: 1990-09-07 Today's Date: 10/01/2020    History of Present Illness 30 year female  Presents to ED 09/26/20 with shortness of breath and abdominal pain. Admitted for treatment of Acute hypoxic respiratory failure PMH: history of asthma, depression, HTN, recent COVID infection, obesity, and diabetes   Clinical Impression   PTA, pt lives with family and reports Independence with ADLs, IADLs, and mobility without AD. Pt also cares for her young children (2 and 88 y/o). Pt presents now with deficits in cardiopulmonary tolerance, strength and endurance. Pt dyspneic on entry at rest in recliner and lethargic. Pt with increasing SOB with minimal activity so further mobility deferred. Pt overall Independent for sit to stand from recliner without AD. Pt Setup for UB ADLs and Min A for LB ADLs due to deficits. Extended time spent educating on energy conservation, including encouragement of shower chair use at home to maximize independence with daily tasks. Pt with difficulty demo pursed lip breathing, so further reinforcement of energy conservation strategies needed.   Pt received on 2 L O2, 84% at rest. With cues for slowing breathing, increased to 87% but ultimately had to be bumped up to 3 L O2 to maintain at 91%.    Follow Up Recommendations  Home health OT;Supervision - Intermittent    Equipment Recommendations  Other (comment) (pt reports having access to shower chair; may need supplemental O2 at home)    Recommendations for Other Services       Precautions / Restrictions Precautions Precautions: Other (comment) Precaution Comments: monitor O2 Restrictions Weight Bearing Restrictions: No      Mobility Bed Mobility               General bed mobility comments: received sitting in chair    Transfers Overall transfer level: Independent Equipment used: None Transfers: Sit to/from [employer] transfer comment: Independent for sit to stand from chair without AD. Unable to progress mobility due to SOB/lethargy    Balance Overall balance assessment: Needs assistance Sitting-balance support: Feet supported;No upper extremity supported Sitting balance-Leahy Scale: Fair     Standing balance support: No upper extremity supported Standing balance-Leahy Scale: Fair Standing balance comment: fair static standing                           ADL either performed or assessed with clinical judgement   ADL Overall ADL's : Needs assistance/impaired Eating/Feeding: Independent;Sitting   Grooming: Supervision/safety;Standing   Upper Body Bathing: Sitting;Set up   Lower Body Bathing: Min guard;Sit to/from stand   Upper Body Dressing : Set up;Sitting   Lower Body Dressing: Minimal assistance;Sit to/from stand   Toilet Transfer: Min guard;Ambulation   Toileting- Clothing Manipulation and Hygiene: Supervision/safety;Sit to/from stand;Sitting/lateral lean       Functional mobility during ADLs: Min guard General ADL Comments: Pt limited greatly by decreased cardiopulmonary tolerance and SOB with minimal activity. Provided energy conservation handout with education on ADLs/IADLs     Vision Patient Visual Report: No change from baseline Vision Assessment?: No apparent visual deficits     Perception     Praxis      Pertinent Vitals/Pain Pain Assessment: Faces Faces Pain Scale: Hurts a little bit Pain Location: headache, back Pain Descriptors / Indicators: Headache;Sore Pain Intervention(s): Monitored during session;Repositioned     Hand Dominance Right   Extremity/Trunk Assessment Upper Extremity Assessment  Upper Extremity Assessment: RUE deficits/detail;LUE deficits/detail RUE Deficits / Details: ROM limited by body habitus RUE Coordination: decreased fine motor LUE Deficits / Details: ROM limited by body habitus LUE Coordination: decreased  fine motor   Lower Extremity Assessment Lower Extremity Assessment: Defer to PT evaluation   Cervical / Trunk Assessment Cervical / Trunk Assessment: Other exceptions Cervical / Trunk Exceptions: increased body habitus   Communication Communication Communication: No difficulties   Cognition Arousal/Alertness: Awake/alert Behavior During Therapy: Flat affect Overall Cognitive Status: Within Functional Limits for tasks assessed                                     General Comments  Pt received on 2 L O2, increased dyspnea noted at rest in chair with SpO2 84%. Increased time and cues for pursed lip breathing with increase to 87%. Bumped up to 3 L O2 with 89% at rest, 93% after sit to stand and return to 91% at rest in chair. RN aware.    Exercises     Shoulder Instructions      Home Living Family/patient expects to be discharged to:: Private residence Living Arrangements: Children;Other relatives (grandmother, brother, and her 2 young children) Available Help at Discharge: Family;Available 24 hours/day Type of Home: Apartment Home Access: Stairs to enter Entrance Stairs-Number of Steps: 7 Entrance Stairs-Rails: Right Home Layout: One level     Bathroom Shower/Tub: Chief Strategy Officer: Standard Bathroom Accessibility: Yes   Home Equipment: Shower seat   Additional Comments: per pt, her grandmother has a shower seat that she could use      Prior Functioning/Environment Level of Independence: Independent        Comments: independent without AD though increasing difficulty breathing. Cares for young children (2 & 4 y/o)        OT Problem List: Decreased strength;Decreased activity tolerance;Cardiopulmonary status limiting activity;Decreased knowledge of use of DME or AE;Decreased knowledge of precautions      OT Treatment/Interventions: Self-care/ADL training;Therapeutic exercise;Energy conservation;Therapeutic activities;Balance  training;Patient/family education    OT Goals(Current goals can be found in the care plan section) Acute Rehab OT Goals Patient Stated Goal: go home to children OT Goal Formulation: With patient Time For Goal Achievement: 10/15/20 Potential to Achieve Goals: Good ADL Goals Pt Will Transfer to Toilet: Independently;ambulating Pt Will Perform Tub/Shower Transfer: Tub transfer;Independently;ambulating;shower seat Pt/caregiver will Perform Home Exercise Program: Increased strength;Both right and left upper extremity;With theraband;Independently;With written HEP provided Additional ADL Goal #1: Pt to verbalize at least 3 energy conservation strategies to implement during ADLs/IADLs Additional ADL Goal #2: Pt to independently monitor SpO2 and implement pursed lip breathing to maintain SpO2 90% and above  OT Frequency: Min 2X/week   Barriers to D/C:            Co-evaluation              AM-PAC OT "6 Clicks" Daily Activity     Outcome Measure Help from another person eating meals?: None Help from another person taking care of personal grooming?: A Little Help from another person toileting, which includes using toliet, bedpan, or urinal?: A Little Help from another person bathing (including washing, rinsing, drying)?: A Little Help from another person to put on and taking off regular upper body clothing?: A Little Help from another person to put on and taking off regular lower body clothing?: A Little 6 Click Score: 19  End of Session Equipment Utilized During Treatment: Oxygen Nurse Communication: Mobility status;Other (comment) (O2)  Activity Tolerance: Patient limited by fatigue;Patient limited by lethargy Patient left: in chair;with call bell/phone within reach;Other (comment) (with medical team present)  OT Visit Diagnosis: Muscle weakness (generalized) (M62.81);Other (comment) (decreased cardiopulmonary tolerance)                Time: 1761-6073 OT Time Calculation (min):  19 min Charges:  OT General Charges $OT Visit: 1 Visit OT Evaluation $OT Eval Moderate Complexity: 1 Mod  Bradd Canary, OTR/L Acute Rehab Services Office: [phone]  Lorre Munroe 10/01/2020, 9:43 AM

## 2020-10-01 NOTE — Progress Notes (Signed)
   10/01/20 1020 10/01/20 1120  Vitals  Temp 99.4 F (37.4 C) 99.1 F (37.3 C)  Temp Source Oral Axillary  BP (!) 154/97 (!) 147/98  MAP (mmHg) 111 111  BP Location Right Arm Right Arm  BP Method Automatic Automatic  Patient Position (if appropriate) Sitting Sitting  Pulse Rate 100 100  Pulse Rate Source Apical Apical  Resp (!) 42 (!) 24  Level of Consciousness  Level of Consciousness Alert  --   MEWS COLOR  MEWS Score Color Yellow Green  Oxygen Therapy  SpO2 93 % 91 %  O2 Device Nasal Cannula Nasal Cannula  O2 Flow Rate (L/min) 3 L/min 3 L/min   Administered lasix and duoneb. Dr. Alphonzo Severance aware, no new orders. Continue to monitor. CXR with increased pulmonary edema

## 2020-10-01 NOTE — Progress Notes (Signed)
BRIEF NOTE  Received secure chat message from RN ~6:50 pm reporting patient tachypneic and tachycardic to the 110s. Reported robust UOP following Lasix 40 mg IV x 2.   Subjective: Evaluated patient at bedside. She was sitting in bedside chair, sleeping, aroused easily to voice. States she is breathing comfortably, has noticed an improvement since diuresis. States she will use her CPAP tonight.  Objective:  Afebrile, P low 100s, BP 150s-160s/90s-100s spO2 >91% on 3 L [place] General: Patient sitting in bedside chair, sleeping initially, arouses easily to voice Pulmonary: tachypneic, slightly increased work of breathing on 3 L [place], audible wheezes, however upon auscultation wheezes are scattered throughout the lung fields, appears to be component of transmitted upper airway sounds; diminished breath sounds consistent with earlier this morning Cardiac: tachycardic, regular rhythm, no m/r/g Extremities: warm and dry, lower extremity edema stable from prior  Assessment/Plan: Patient reports subjective improvement in breathing following diuresis today. Mental status unchanged from earlier, sleepy but arouses easily to voice and maintains conversation. Stressed importance of using CPAP tonight. No change to current treatment plan.   Alphonzo Severance, MD Internal Medicine Resident, PGY-1 [facility] Pager: [phone] 10/01/2020

## 2020-10-01 NOTE — Progress Notes (Signed)
Inpatient Diabetes Program Recommendations  AACE/ADA: New Consensus Statement on Inpatient Glycemic Control   Target Ranges:  Prepandial:   less than 140 mg/dL      Peak postprandial:   less than 180 mg/dL (1-2 hours)      Critically ill patients:  140 - 180 mg/dL   Results for Yvette Gibbs, Yvette Gibbs (MRN [id number]) as of 10/01/2020 09:10  Ref. Range 09/30/2020 07:52 09/30/2020 11:23 09/30/2020 16:12 09/30/2020 20:45 10/01/2020 07:31  Glucose-Capillary Latest Ref Range: 70 - 99 mg/dL 201 (H) 228 (H) 273 (H) 223 (H) 173 (H)  Results for Yvette Gibbs, Yvette Gibbs (MRN [id number]) as of 10/01/2020 09:10  Ref. Range 09/17/2020 11:33 09/26/2020 12:37  Hemoglobin A1C Latest Ref Range: 4.8 - 5.6 % 9.8 (H) 10.9 (H)   Review of Glycemic Control  Diabetes history: DM2 Outpatient Diabetes medications: None Current orders for Inpatient glycemic control: Lantus 55 units BID, Novolog 0-20 units TID with meals, Novolog 0-5 units QHS, Metformin 500 mg QAM  Inpatient Diabetes Program Recommendations:  Insulin: Please consider ordering Novolog 4 units TID with meals for meal coverage if patient eats at least 50% of meals.   NOTE: Noted consult for diabetes coordinator for education. Inpatient diabetes coordinator spoke with patient on 09/28/20 regarding DM and insulin. 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 Patient reported that she would prefer to use insulin (pens) for outpatient DM control and was also interested in using a GLP-1 type of medication to help with appetite and weight. At time of discharge, please provide Rx for glucose monitoring kit (#[id number]), Lantus SoloStar [phone]), insulin pen needles (#[id number]), along with any other DM medication prescribed.  Thanks, Barnie Alderman, RN, MSN, CDE Diabetes Coordinator Inpatient Diabetes Program [phone] (Team Pager from 8am to 5pm)

## 2020-10-02 DIAGNOSIS — I110 Hypertensive heart disease with heart failure: Secondary | ICD-10-CM | POA: Diagnosis not present

## 2020-10-02 DIAGNOSIS — I5033 Acute on chronic diastolic (congestive) heart failure: Secondary | ICD-10-CM | POA: Diagnosis not present

## 2020-10-02 DIAGNOSIS — R1013 Epigastric pain: Secondary | ICD-10-CM | POA: Diagnosis not present

## 2020-10-02 DIAGNOSIS — D649 Anemia, unspecified: Secondary | ICD-10-CM | POA: Diagnosis not present

## 2020-10-02 DIAGNOSIS — G4733 Obstructive sleep apnea (adult) (pediatric): Secondary | ICD-10-CM | POA: Diagnosis not present

## 2020-10-02 DIAGNOSIS — R1030 Lower abdominal pain, unspecified: Secondary | ICD-10-CM | POA: Diagnosis not present

## 2020-10-02 DIAGNOSIS — F32A Depression, unspecified: Secondary | ICD-10-CM | POA: Diagnosis not present

## 2020-10-02 DIAGNOSIS — Z8616 Personal history of COVID-19: Secondary | ICD-10-CM | POA: Diagnosis not present

## 2020-10-02 DIAGNOSIS — J45901 Unspecified asthma with (acute) exacerbation: Secondary | ICD-10-CM | POA: Diagnosis not present

## 2020-10-02 DIAGNOSIS — K760 Fatty (change of) liver, not elsewhere classified: Secondary | ICD-10-CM | POA: Diagnosis not present

## 2020-10-02 DIAGNOSIS — J9601 Acute respiratory failure with hypoxia: Secondary | ICD-10-CM | POA: Diagnosis not present

## 2020-10-02 LAB — CBC WITH DIFFERENTIAL/PLATELET
Abs Immature Granulocytes: 0.1 10*3/uL — ABNORMAL HIGH (ref 0.00–0.07)
Basophils Absolute: 0 10*3/uL (ref 0.0–0.1)
Basophils Relative: 0 %
Eosinophils Absolute: 0.2 10*3/uL (ref 0.0–0.5)
Eosinophils Relative: 2 %
HCT: 34.3 % — ABNORMAL LOW (ref 36.0–46.0)
Hemoglobin: 9.8 g/dL — ABNORMAL LOW (ref 12.0–15.0)
Immature Granulocytes: 1 %
Lymphocytes Relative: 18 %
Lymphs Abs: 1.9 10*3/uL (ref 0.7–4.0)
MCH: 26.3 pg (ref 26.0–34.0)
MCHC: 28.6 g/dL — ABNORMAL LOW (ref 30.0–36.0)
MCV: 92 fL (ref 80.0–100.0)
Monocytes Absolute: 1.1 10*3/uL — ABNORMAL HIGH (ref 0.1–1.0)
Monocytes Relative: 11 %
Neutro Abs: 7 10*3/uL (ref 1.7–7.7)
Neutrophils Relative %: 68 %
Platelets: 257 10*3/uL (ref 150–400)
RBC: 3.73 MIL/uL — ABNORMAL LOW (ref 3.87–5.11)
RDW: 15.3 % (ref 11.5–15.5)
WBC: 10.4 10*3/uL (ref 4.0–10.5)
nRBC: 0 % (ref 0.0–0.2)

## 2020-10-02 LAB — BASIC METABOLIC PANEL
Anion gap: 8 (ref 5–15)
BUN: 7 mg/dL (ref 6–20)
CO2: 38 mmol/L — ABNORMAL HIGH (ref 22–32)
Calcium: 9 mg/dL (ref 8.9–10.3)
Chloride: 91 mmol/L — ABNORMAL LOW (ref 98–111)
Creatinine, Ser: 0.62 mg/dL (ref 0.44–1.00)
GFR, Estimated: >60 mL/min (ref >60)
Glucose, Bld: 169 mg/dL — ABNORMAL HIGH (ref 70–99)
Potassium: 3.6 mmol/L (ref 3.5–5.1)
Sodium: 137 mmol/L (ref 135–145)

## 2020-10-02 LAB — GLUCOSE, CAPILLARY
Glucose-Capillary: 134 mg/dL — ABNORMAL HIGH (ref 70–99)
Glucose-Capillary: 197 mg/dL — ABNORMAL HIGH (ref 70–99)
Glucose-Capillary: 164 mg/dL — ABNORMAL HIGH (ref 70–99)
Glucose-Capillary: 220 mg/dL — ABNORMAL HIGH (ref 70–99)

## 2020-10-02 LAB — MAGNESIUM: Magnesium: 1.8 mg/dL (ref 1.7–2.4)

## 2020-10-02 MED ORDER — FLUCONAZOLE 150 MG PO TABS
150.0000 mg | ORAL_TABLET | Freq: Once | ORAL | Status: AC
  Administered 2020-10-02: 150 mg via ORAL
  Filled 2020-10-02: qty 1

## 2020-10-02 MED ORDER — FUROSEMIDE 10 MG/ML IJ SOLN
60.0000 mg | Freq: Two times a day (BID) | INTRAMUSCULAR | Status: AC
  Administered 2020-10-02 – 2020-10-03 (×2): 60 mg via INTRAVENOUS
  Filled 2020-10-02 (×2): qty 6

## 2020-10-02 NOTE — Progress Notes (Signed)
   10/01/20 2203  Assess: MEWS Score  Temp 100 F (37.8 C)  BP (!) 160/119  Pulse Rate (!) 101  ECG Heart Rate (!) 103  Resp (!) 34  Level of Consciousness Alert  SpO2 98 %  O2 Device Bi-PAP  Assess: MEWS Score  MEWS Temp 0  MEWS Systolic 0  MEWS Pulse 1  MEWS RR 2  MEWS LOC 0  MEWS Score 3  MEWS Score Color Yellow  Assess: if the MEWS score is Yellow or Red  Were vital signs taken at a resting state? Yes  Focused Assessment No change from prior assessment  Early Detection of Sepsis Score *See Row Information* Low  MEWS guidelines implemented *See Row Information* No, vital signs rechecked  Treat  MEWS Interventions Administered scheduled meds/treatments  Pain Scale 0-10  Pain Score 0  Take Vital Signs  Increase Vital Sign Frequency  Yellow: Q 2hr X 2 then Q 4hr X 2, if remains yellow, continue Q 4hrs  Escalate  MEWS: Escalate Yellow: discuss with charge nurse/RN and consider discussing with provider and RRT  Notify: Charge Nurse/RN  Name of Charge Nurse/RN Notified Isiah  Date Charge Nurse/RN Notified 10/01/20  Time Charge Nurse/RN Notified 2239  Notify: Provider  Provider Name/Title Katsadouros  Date Provider Notified 10/01/20  Time Provider Notified 2239  Notification Type Page  Provider response No new orders  Date of Provider Response 10/01/20  Time of Provider Response 2239

## 2020-10-02 NOTE — Progress Notes (Signed)
Pt placed on BIPAP per pts request. Pt is tolerating well at this time.

## 2020-10-02 NOTE — Progress Notes (Signed)
Inpatient Diabetes Program Recommendations  AACE/ADA: New Consensus Statement on Inpatient Glycemic Control   Target Ranges:  Prepandial:   less than 140 mg/dL      Peak postprandial:   less than 180 mg/dL (1-2 hours)      Critically ill patients:  140 - 180 mg/dL   Results for Yvette Gibbs, Yvette Gibbs (MRN [id number]) as of 10/02/2020 09:35  Ref. Range 10/01/2020 07:31 10/01/2020 11:24 10/01/2020 16:44 10/01/2020 20:32 10/02/2020 08:00  Glucose-Capillary Latest Ref Range: 70 - 99 mg/dL 242 (H) 353 (H) 614 (H) 251 (H) 134 (H)   Review of Glycemic Control  Diabetes history: DM2 Outpatient Diabetes medications: None Current orders for Inpatient glycemic control: Lantus 55 units BID, Novolog 0-20 units TID with meals, Novolog 0-5 units QHS, Novolog 4 units TID with meals, Metformin 500 mg QAM  Inpatient Diabetes Program Recommendations:  Insulin: Please consider increasing meal coverage to Novolog 8 units TID with meals if patient eats at least 50% of meals.  Thanks, Orlando Penner, RN, MSN, CDE Diabetes Coordinator Inpatient Diabetes Program [phone] (Team Pager from 8am to 5pm)

## 2020-10-02 NOTE — Progress Notes (Addendum)
Paged by nursing staff that patient's oxygen saturation dropped to 85% on BiPAP.  Patient evaluated at bedside, did not appear to have BiPAP on properly.  Patient states that her shortness of breath or worsening acutely.  On examination she had chest pain that is reproducible palpations.  Her lung exam anteriorly with diffuse wheezing.  Per nursing staff patient had received breathing treatment an hour prior.   Discussed with respiratory therapy, decided to place patient on CPAP as thought to be secondary to hypervolemia.  Hold off on breathing treatments for now

## 2020-10-02 NOTE — Progress Notes (Signed)
HD#4 Subjective:   Overnight, patient placed on BiPAP and tolerated well.  During evaluation this morning, patient sitting in bedside chair, wearing BiPaP. She gives thumbs up and states she feels her breathing has improved with BiPAP. States her legs feel less tight, and her belly is softer than yesterday but still firm. She denies chest pain.    Objective:   Vital signs in last 24 hours: Vitals:   10/02/20 0000 10/02/20 0152 10/02/20 0600 10/02/20 0738  BP: 130/72 (!) 146/91 134/80   Pulse: (!) 102 94 (!) 101 99  Resp: (!) 30 (!) 23 (!) 28 (!) 29  Temp: 98.7 F (37.1 C) 98.2 F (36.8 C) 99.5 F (37.5 C) 98.3 F (36.8 C)  TempSrc: Axillary Axillary Axillary   SpO2: 99% 91% 96% 96%  Weight:      Height:       Physical Exam Constitutional: morbidly obese, sitting in bedside chair, wearing BiPaP, in no acute distress Cardiac: tachycardic, regular rhythm Pulm: normal work of breathing on BiPAP, patient speaks in full sentences, diminished lung sounds throughout with diffuse end-expiratory wheeze Extremities: legs slightly softer compared to yesterday Neuro: awake and alert, oriented x 3  Filed Weights   09/28/20 0250 09/28/20 0849  Weight: (!) 190 kg (!) 190 kg    Intake/Output Summary (Last 24 hours) at 10/02/2020 1351 Last data filed at 10/01/2020 1852 Gross per 24 hour  Intake --  Output 1500 ml  Net -1500 ml   Net IO Since Admission: -4,655.88 mL [10/02/20 1351]   Assessment/Plan:   Principal Problem:   Heart failure with preserved ejection fraction (HCC) Active Problems:   Morbid obesity (HCC)   OSA (obstructive sleep apnea)   Abdominal pain   HTN (hypertension)   Proteinuria   Type 2 diabetes mellitus without complication, without long-term current use of insulin (HCC)   Gastroesophageal reflux disease without esophagitis   Asthma exacerbation   Obesity hypoventilation syndrome (HCC)   Acute respiratory failure with hypoxia (HCC)   Normocytic  anemia   Mild concentric left ventricular hypertrophy (LVH)   NAFLD (nonalcoholic fatty liver disease)   Patient Summary:  Yvette Gibbs is a 30 year old woman with a history asthma, depression, HTN, recent COVID infection, morbid obesity, and diabetes who presented with shortness of breath and abdominal pain and admitted for acute hypoxic respiratory failure which is suspected to be secondary to possible diastolic heart failure from untreated OSA/OHS  This is hospital day 4.   Acute hypoxic respiratory failure 2/2 diastolic heart failure Suspected underlying OHS/OSA Patient with improved mentation and work of breathing this morning following overnight treatment with BiPap and diuresis yesterday. 3.75 L UOP yesterday + 3x unmeasured output. Continues on BiPap this morning. Assessment of volume status is complicated by body habitus.  Bicarb stable at 38 this morning. Will continue diuresis today. - Lasix 60 mg IV BID - continue BiPap  - wean supplemental O2 as able, though suspect patient will qualify for home oxygen - will need outpatient sleep study and PFTs after discharge - PT recommend HH PT, bariatric rollator  Uncontrolled HTN. Likely in the setting of untreated OSA/OHS, hypervolemia. BP improving with diuresis.  - Diuresis as above - continue irbasartan 150mg  daily, amlodipine 10 mg daily - consider transition to olmesartan-amlodipine at discharge (on medicaid formulary) if patient is willing to start  Uncontrolled type 2 diabetes mellitus Blood glucose improved after increasing Lantus from 55 u BID.  - Continue Lantus 55 u BID - Will increase  Novolog 4->8 u WC, appreciate diabetes coordinator's recs - SSI resistant - metformin 500 mg daily, titrate further with PCP - consider initiation of Victoza prior to discharge vs initiation with PCP   Yeast infection Patient reports improvement but not resolution of symptoms. Will redose fluconazole.  -  fluconazole 150 mg  today  Abdominal pain Stable.  - continue protonix 40 mg daily, metoclopramide 5 mg TID - sucralfate 1g TID  - avoid NSAIDS  Normocytic anemia. Hgb stable at 9.8 this morning. No signs of blood loss at this time. Will monitor closely. Iron studies suggest a component of iron deficiency as well. Consider starting iron supplementation however suspect that her GI symptoms will not tolerate this. - AM CBC  Diet: Carb-Modified IVF: None VTE: Enoxaparin Code: Full  Alphonzo Severance, MD Internal Medicine Resident, PGY-1 [facility] Pager: [phone] 7:55 AM, 10/02/2020

## 2020-10-02 NOTE — Progress Notes (Signed)
Pt removed from BIPAP and placed on 6L [place]. Pt is tolerating well at this time. RT to continue to monitor.

## 2020-10-02 NOTE — Progress Notes (Signed)
OT Cancellation Note  Patient Details Name: Sanela Evola MRN: [id number] DOB: Feb 13, 1991   Cancelled Treatment:    Reason Eval/Treat Not Completed: Fatigue/lethargy limiting ability to participate;Other (comment) pt received on Bipap falling asleep in chair and declining OT session d/t feeling "sleepy." will check back as time allows for OT session.   Lenor Derrick., COTA/L Acute Rehabilitation Services [phone]   Barron Schmid 10/02/2020, 3:49 PM

## 2020-10-02 NOTE — Progress Notes (Signed)
Physical Therapy Treatment Patient Details Name: Yvette Gibbs MRN: [id number] DOB: [birth year] Today's Date: 10/02/2020    History of Present Illness 30 year female  Presents to ED 09/26/20 with shortness of breath and abdominal pain. Admitted for treatment of Acute hypoxic respiratory failure PMH: history of asthma, depression, HTN, recent COVID infection, obesity, and diabetes    PT Comments    Pt making slow, steady progress. Able to amb 30' prior to sitting rest break vs 18' during last visit. Pt remains limited by dyspnea and is requiring supplemental O2.    Follow Up Recommendations  Home health PT;Supervision for mobility/OOB     Equipment Recommendations  Other (comment) (Bariatric Rollator)    Recommendations for Other Services       Precautions / Restrictions Precautions Precautions: Other (comment) Precaution Comments: watch SpO2    Mobility  Bed Mobility               General bed mobility comments: Pt up in chair    Transfers Overall transfer level: Modified independent Equipment used: None Transfers: Sit to/from Stand Sit to Stand: Modified independent (Device/Increase time)         General transfer comment: Incr time to rise  Ambulation/Gait Ambulation/Gait assistance: Supervision Gait Distance (Feet): 30 Feet (30' x 1, 10' x 1) Assistive device: None Gait Pattern/deviations: Step-through pattern;Decreased step length - right;Decreased step length - left;Wide base of support Gait velocity: decr Gait velocity interpretation: 1.31 - 2.62 ft/sec, indicative of limited community ambulator General Gait Details: supervision for safety. 3 minutes between amb. No loss of balance. Pt declined use of rollator   Stairs             Wheelchair Mobility    Modified Rankin (Stroke Patients Only)       Balance Overall balance assessment: Needs assistance Sitting-balance support: Feet supported;No upper extremity supported Sitting  balance-Leahy Scale: Good     Standing balance support: During functional activity;No upper extremity supported Standing balance-Leahy Scale: Good Standing balance comment: No loss of balance with gait                            Cognition Arousal/Alertness: Awake/alert Behavior During Therapy: Flat affect Overall Cognitive Status: Within Functional Limits for tasks assessed                                        Exercises      General Comments General comments (skin integrity, edema, etc.): Pt on 6L O2 at rest with SpO2 93%. Amb on 6L with SpO2 dropping to 86%. Recovered to 89% after 2 minutes.      Pertinent Vitals/Pain Pain Assessment: Faces    Home Living                      Prior Function            PT Goals (current goals can now be found in the care plan section) Acute Rehab PT Goals Patient Stated Goal: go home to children Progress towards PT goals: Progressing toward goals    Frequency    Min 3X/week      PT Plan Current plan remains appropriate    Co-evaluation              AM-PAC PT "6 Clicks" Mobility   Outcome Measure  Help needed  turning from your back to your side while in a flat bed without using bedrails?: None Help needed moving from lying on your back to sitting on the side of a flat bed without using bedrails?: None Help needed moving to and from a bed to a chair (including a wheelchair)?: None Help needed standing up from a chair using your arms (e.g., wheelchair or bedside chair)?: None Help needed to walk in hospital room?: None Help needed climbing 3-5 steps with a railing? : A Little 6 Click Score: 23    End of Session Equipment Utilized During Treatment: Oxygen Activity Tolerance: Patient limited by fatigue (Limited by dyspnea) Patient left: with call bell/phone within reach;in chair Nurse Communication: Mobility status PT Visit Diagnosis: Other abnormalities of gait and mobility  (R26.89);Muscle weakness (generalized) (M62.81);Difficulty in walking, not elsewhere classified (R26.2)     Time: 6578-4696 PT Time Calculation (min) (ACUTE ONLY): 14 min  Charges:  $Gait Training: 8-22 mins                     [facility] PT Acute Rehabilitation Services Pager [phone] Office [phone]    Angelina Ok [facility] 10/02/2020, 1:47 PM

## 2020-10-03 ENCOUNTER — Encounter (HOSPITAL_COMMUNITY): Payer: Self-pay | Admitting: Internal Medicine

## 2020-10-03 ENCOUNTER — Inpatient Hospital Stay (HOSPITAL_COMMUNITY): Payer: Medicaid Other

## 2020-10-03 DIAGNOSIS — J45901 Unspecified asthma with (acute) exacerbation: Secondary | ICD-10-CM | POA: Diagnosis not present

## 2020-10-03 DIAGNOSIS — I5033 Acute on chronic diastolic (congestive) heart failure: Secondary | ICD-10-CM | POA: Diagnosis not present

## 2020-10-03 DIAGNOSIS — R0600 Dyspnea, unspecified: Secondary | ICD-10-CM | POA: Diagnosis not present

## 2020-10-03 DIAGNOSIS — J9601 Acute respiratory failure with hypoxia: Secondary | ICD-10-CM | POA: Diagnosis not present

## 2020-10-03 DIAGNOSIS — R1013 Epigastric pain: Secondary | ICD-10-CM | POA: Diagnosis not present

## 2020-10-03 DIAGNOSIS — I110 Hypertensive heart disease with heart failure: Secondary | ICD-10-CM | POA: Diagnosis not present

## 2020-10-03 DIAGNOSIS — B379 Candidiasis, unspecified: Secondary | ICD-10-CM | POA: Diagnosis not present

## 2020-10-03 DIAGNOSIS — R1030 Lower abdominal pain, unspecified: Secondary | ICD-10-CM | POA: Diagnosis not present

## 2020-10-03 DIAGNOSIS — D649 Anemia, unspecified: Secondary | ICD-10-CM | POA: Diagnosis not present

## 2020-10-03 DIAGNOSIS — E1165 Type 2 diabetes mellitus with hyperglycemia: Secondary | ICD-10-CM | POA: Diagnosis not present

## 2020-10-03 LAB — BLOOD GAS, ARTERIAL
Acid-Base Excess: 14.6 mmol/L — ABNORMAL HIGH (ref 0.0–2.0)
Bicarbonate: 39.6 mmol/L — ABNORMAL HIGH (ref 20.0–28.0)
Drawn by: 28099
FIO2: 28
O2 Saturation: 91.7 %
Patient temperature: 37
pCO2 arterial: 59.4 mmHg — ABNORMAL HIGH (ref 32.0–48.0)
pH, Arterial: 7.439 (ref 7.350–7.450)
pO2, Arterial: 61.9 mmHg — ABNORMAL LOW (ref 83.0–108.0)

## 2020-10-03 LAB — BASIC METABOLIC PANEL
Anion gap: 8 (ref 5–15)
BUN: 10 mg/dL (ref 6–20)
CO2: 40 mmol/L — ABNORMAL HIGH (ref 22–32)
Calcium: 8.9 mg/dL (ref 8.9–10.3)
Chloride: 87 mmol/L — ABNORMAL LOW (ref 98–111)
Creatinine, Ser: 0.52 mg/dL (ref 0.44–1.00)
GFR, Estimated: >60 mL/min (ref >60)
Glucose, Bld: 132 mg/dL — ABNORMAL HIGH (ref 70–99)
Potassium: 4.2 mmol/L (ref 3.5–5.1)
Sodium: 135 mmol/L (ref 135–145)

## 2020-10-03 LAB — CBC WITH DIFFERENTIAL/PLATELET
Abs Immature Granulocytes: 0.08 10*3/uL — ABNORMAL HIGH (ref 0.00–0.07)
Basophils Absolute: 0 10*3/uL (ref 0.0–0.1)
Basophils Relative: 0 %
Eosinophils Absolute: 0.4 10*3/uL (ref 0.0–0.5)
Eosinophils Relative: 4 %
HCT: 32 % — ABNORMAL LOW (ref 36.0–46.0)
Hemoglobin: 9.2 g/dL — ABNORMAL LOW (ref 12.0–15.0)
Immature Granulocytes: 1 %
Lymphocytes Relative: 17 %
Lymphs Abs: 1.8 10*3/uL (ref 0.7–4.0)
MCH: 26.4 pg (ref 26.0–34.0)
MCHC: 28.8 g/dL — ABNORMAL LOW (ref 30.0–36.0)
MCV: 91.7 fL (ref 80.0–100.0)
Monocytes Absolute: 1 10*3/uL (ref 0.1–1.0)
Monocytes Relative: 9 %
Neutro Abs: 7.1 10*3/uL (ref 1.7–7.7)
Neutrophils Relative %: 69 %
Platelets: 278 10*3/uL (ref 150–400)
RBC: 3.49 MIL/uL — ABNORMAL LOW (ref 3.87–5.11)
RDW: 15.2 % (ref 11.5–15.5)
WBC: 10.3 10*3/uL (ref 4.0–10.5)
nRBC: 0 % (ref 0.0–0.2)

## 2020-10-03 LAB — GLUCOSE, CAPILLARY
Glucose-Capillary: 97 mg/dL (ref 70–99)
Glucose-Capillary: 99 mg/dL (ref 70–99)
Glucose-Capillary: 170 mg/dL — ABNORMAL HIGH (ref 70–99)
Glucose-Capillary: 161 mg/dL — ABNORMAL HIGH (ref 70–99)

## 2020-10-03 LAB — BLOOD GAS, VENOUS
Acid-Base Excess: 15.5 mmol/L — ABNORMAL HIGH (ref 0.0–2.0)
Bicarbonate: 41.5 mmol/L — ABNORMAL HIGH (ref 20.0–28.0)
FIO2: 36
O2 Saturation: 58 %
Patient temperature: 37
pCO2, Ven: 72.5 mmHg (ref 44.0–60.0)
pH, Ven: 7.376 (ref 7.250–7.430)
pO2, Ven: 33.2 mmHg (ref 32.0–45.0)

## 2020-10-03 LAB — MAGNESIUM: Magnesium: 1.9 mg/dL (ref 1.7–2.4)

## 2020-10-03 MED ORDER — INSULIN ASPART 100 UNIT/ML ~~LOC~~ SOLN
6.0000 [IU] | Freq: Three times a day (TID) | SUBCUTANEOUS | Status: DC
  Administered 2020-10-03 – 2020-10-08 (×17): 6 [IU] via SUBCUTANEOUS

## 2020-10-03 MED ORDER — FUROSEMIDE 10 MG/ML IJ SOLN
60.0000 mg | Freq: Once | INTRAMUSCULAR | Status: AC
  Administered 2020-10-03: 60 mg via INTRAVENOUS
  Filled 2020-10-03: qty 6

## 2020-10-03 MED ORDER — FUROSEMIDE 10 MG/ML IJ SOLN: 60.0000 mg | Freq: Once | INTRAMUSCULAR | Status: DC

## 2020-10-03 NOTE — Progress Notes (Signed)
Pt currently off BIPAP and on 4L . Pt is tolerating well at this time. WOB WNL.

## 2020-10-03 NOTE — TOC Progression Note (Addendum)
Transition of Care [facility]) - Progression Note    Patient Details  Name: Yvette Gibbs MRN: [id number] Date of Birth: 1990/10/02  Transition of [facility]) CM/SW Contact  Leone Haven, RN Phone Number: 10/03/2020, 1:18 PM  Clinical Narrative:    NCM was informed patient will need home oxygen. NCM made referral to [facility] with [employer]. Patient is ok with [employer] supplying her home oxygen, she states she will be transported home via car.  She also states she has the rollator in the room with her.  She will need HHPT, HHOT set up.  She does not have a preference.  Per Ian Malkin with [employer] for OHS dx , need ABG while awake and breathing prescribed oxygen and spirometry, then will need a second ABG immediately upon waking or during sleep while breathing prescribed oxygen. Insurance will need certain values.      Expected Discharge Plan: Home w Home Health Services Barriers to Discharge: No Barriers Identified  Expected Discharge Plan and Services Expected Discharge Plan: Home w Home Health Services   Discharge Planning Services: CM Consult,Follow-up appt scheduled Post Acute Care Choice: Durable Medical Equipment,Home Health Living arrangements for the past 2 months: Single Family Home                 DME Arranged: Bipap,Walker rolling DME Agency: [employer] Date DME Agency Contacted: 09/30/20 Time DME Agency Contacted: [id number] Representative spoke with at DME Agency: Velna Hatchet             Social Determinants of Health (SDOH) Interventions    Readmission Risk Interventions No flowsheet data found.

## 2020-10-03 NOTE — Progress Notes (Signed)
Reported to Dr. Alphonzo Severance that patient has been having accidents and has been unable to make it to the bathroom d/t telemetry and O2 lines.  Patient states that her yeast infection has improved 100% and verbalizes that she thinks it is gone.

## 2020-10-03 NOTE — Progress Notes (Signed)
Occupational Therapy Treatment Patient Details Name: Yvette Gibbs MRN: [id number] DOB: 1990-09-09 Today's Date: 10/03/2020    History of present illness 30 year female  Presents to ED 09/26/20 with shortness of breath and abdominal pain. Admitted for treatment of Acute hypoxic respiratory failure PMH: history of asthma, depression, HTN, recent COVID infection, obesity, and diabetes   OT comments  Patient continues to make steady progress towards goals in skilled OT session. Patient's session encompassed further education on energy conservation. Pt stating she had been up since 5, and moving around the room and taking walks in the hall intermittently (pt up in chair upon arrival) and politely declining further mobility. Pt able to verbalize strategies for increased independence at home, especially with the care of her Yvette Gibbs children after education provided by therapist. Discharge remains appropriate, therapy will follow in house.    Follow Up Recommendations  Home health OT;Supervision - Intermittent    Equipment Recommendations  Other (comment) (pt reports having access to shower chair; may need supplemental O2 at home)    Recommendations for Other Services      Precautions / Restrictions Precautions Precautions: Other (comment) Precaution Comments: watch SpO2 Restrictions Weight Bearing Restrictions: No       Mobility Bed Mobility               General bed mobility comments: Pt up in chair    Transfers                      Balance                                           ADL either performed or assessed with clinical judgement   ADL Overall ADL's : Needs assistance/impaired                                       General ADL Comments: pt session focus on education with regard to enegy conservation in preparation for discharge home     Vision       Perception     Praxis      Cognition Arousal/Alertness:  Awake/alert Behavior During Therapy: Flat affect Overall Cognitive Status: Within Functional Limits for tasks assessed                                          Exercises     Shoulder Instructions       General Comments      Pertinent Vitals/ Pain       Pain Assessment: No/denies pain  Home Living                                          Prior Functioning/Environment              Frequency  Min 2X/week        Progress Toward Goals  OT Goals(current goals can now be found in the care plan section)  Progress towards OT goals: Progressing toward goals  Acute Rehab OT Goals Patient Stated Goal: go home to children OT Goal Formulation: With  patient Time For Goal Achievement: 10/15/20 Potential to Achieve Goals: Good  Plan Discharge plan remains appropriate    Co-evaluation                 AM-PAC OT "6 Clicks" Daily Activity     Outcome Measure   Help from another person eating meals?: None Help from another person taking care of personal grooming?: A Little Help from another person toileting, which includes using toliet, bedpan, or urinal?: A Little Help from another person bathing (including washing, rinsing, drying)?: A Little Help from another person to put on and taking off regular upper body clothing?: A Little Help from another person to put on and taking off regular lower body clothing?: A Little 6 Click Score: 19    End of Session Equipment Utilized During Treatment: Oxygen  OT Visit Diagnosis: Muscle weakness (generalized) (M62.81);Other (comment) (decreased cardiopulmonary tolerance)   Activity Tolerance Patient limited by fatigue;Patient limited by lethargy   Patient Left in chair;with call bell/phone within reach;Other (comment) (with RT present)   Nurse Communication Mobility status        Time: 1914-7829 OT Time Calculation (min): 11 min  Charges: OT General Charges $OT Visit: 1 Visit OT  Treatments $Therapeutic Activity: 8-22 mins  [name], COTA/L Acute Rehabilitation Services [phone]   [name] 10/03/2020, 4:13 PM

## 2020-10-03 NOTE — Progress Notes (Signed)
SATURATION QUALIFICATIONS: (This note is used to comply with regulatory documentation for home oxygen)  Patient Saturations on Room Air at Rest = 90%  Patient Saturations on Room Air while Ambulating = 88%  Patient Saturations on 2 Liters of oxygen while Ambulating = 91%  Please briefly explain why patient needs home oxygen: 

## 2020-10-03 NOTE — Progress Notes (Signed)
Sent text to Dr. Alphonzo Severance:  I just received a "CRITICAL" lab value:  PCO2 = 72.5

## 2020-10-03 NOTE — Progress Notes (Signed)
HD#5 Subjective:   No acute events overnight. Patient wore BiPAP throughout the night and tolerated it well. Prior to our evaluation during rounds, she was placed on 4L [place] by respiratory therapy.  During evaluation at bedside this morning, patient states she is feeling "much better" compared to yesterday. States she got a full night sleep. Denies drowsiness or headache. States she feels like her belly and legs are softer after continuing the Lasix. Discussed plan to ambulate with pulse ox and obtain CXR to help evaluate fluid status. Also discussed possibility of discharge home if BiPap can be guaranteed for use at home tonight. Patient expressed understanding.  Objective:   Vital signs in last 24 hours: Vitals:   10/02/20 2138 10/03/20 0018 10/03/20 0345 10/03/20 0425  BP: 135/84  (!) 143/74   Pulse: 96  100   Resp: (!) 21 (!) 25 20   Temp: 99.2 F (37.3 C)  98.1 F (36.7 C)   TempSrc: Oral  Oral   SpO2: 92%  97%   Weight:    (!) 185.3 kg  Height:       Physical Exam Constitutional: morbidly obese, sitting up in bed watching My Little [place]ony on laptop, on 4 L [place], in no acute distress Cardiac: tachycardic, regular rhythm Pulm: normal work of breathing on 4 L [place], patient speaks in full sentences, diminished lung sounds throughout with scattered end-expiratory wheeze Abdomen: softer than before, non-tender to palpation Extremities: legs slightly softer compared to yesterday Neuro: awake and alert, oriented x 3  Filed Weights   09/28/20 0250 09/28/20 0849 10/03/20 0425  Weight: (!) 190 kg (!) 190 kg (!) 185.3 kg    Intake/Output Summary (Last 24 hours) at 10/03/2020 0626 Last data filed at 10/03/2020 0404 Gross per 24 hour  Intake --  Output 1100 ml  Net -1100 ml   Net IO Since Admission: -5,755.88 mL [10/03/20 0626]  Imaging:   DG CHEST PORT 1 VIEW  Result Date: 10/03/2020 CLINICAL DATA:  Dyspnea EXAM: PORTABLE CHEST 1 VIEW COMPARISON:  Two days ago FINDINGS:  Cardiopericardial enlargement with hilar vascular fullness. Generalized interstitial opacity. No visible effusion or pneumothorax. Low volume chest IMPRESSION: Generalized opacity favoring edema. No change when compared to 2 days prior. Electronically Signed   By: Marnee Spring M.D.   On: 10/03/2020 11:09    Assessment/Plan:   Principal Problem:   Heart failure with preserved ejection fraction (HCC) Active Problems:   Morbid obesity (HCC)   OSA (obstructive sleep apnea)   Abdominal pain   HTN (hypertension)   Proteinuria   Type 2 diabetes mellitus without complication, without long-term current use of insulin (HCC)   Gastroesophageal reflux disease without esophagitis   Asthma exacerbation   Obesity hypoventilation syndrome (HCC)   Acute respiratory failure with hypoxia (HCC)   Normocytic anemia   Mild concentric left ventricular hypertrophy (LVH)   NAFLD (nonalcoholic fatty liver disease)   Patient Summary:  Yvette Gibbs is a 30 year old woman with a history asthma, depression, HTN, recent COVID infection, morbid obesity, and diabetes who presented with shortness of breath and abdominal pain and admitted for acute hypoxic respiratory failure which is suspected to be secondary to possible diastolic heart failure from untreated OSA/OHS  This is hospital day 5.   Acute hypoxic respiratory failure 2/2 diastolic heart failure Suspected underlying OHS/OSA Patient reports subjective improvement in her breathing with use of BiPap most of yesterday and overnight. This morning breathing comfortably on 4 L [place]. Bicarb 40 this  morning, has risen gradually during this hospitalization. VBG repeated and pH stable at stable at 7.37. Unfortunately urine output was not recorded for the first half of yesterday, however patient reports feeling better after continued diuresis today. Given that assessment of volume status is complicated by body habitus, obtained CXR which showed slight improvement in  pulmonary edema compared to 2 days ago. Will continue diuresis today, and if patient is able to discharge home, continue her on torsemide 40 mg PO twice daily with close follow-up.   Ambulatory oxygen saturation: performed by RN and demonstrated 90% on room air at rest, 88% on room air with ambulating, 91% on 2 L while ambulating.   Note: Patient's chronic respiratory failure due to suspected OHS/OSA is life threatening. Patient would benefit from non-invasive ventilation.  Without this therapy, the patient is at high risk of ending up with worsening symptoms, worsened respiratory failure, need for ER visits and/or recurrent hospitalizations. CPAP is unable to adequately support patient's nocturnal ventilation needs. Previous ABG in 2018 showed high PCO2. Will obtain ABG.  - ABG - Lasix 60 mg IV BID; torsemide 40 mg BID if discharging home today - continue BiPap PRN - patient qualifies for home oxygen as above, order placed - Order for [facility] PT/OT and bariatric rollator placed - will need outpatient sleep study and PFTs after discharge  Uncontrolled HTN, improving Likely in the setting of untreated OSA/OHS, hypervolemia. BP improving with diuresis.  - Diuresis as above - continue irbasartan 150mg  daily, amlodipine 10 mg daily - consider transition to olmesartan-amlodipine at discharge (on medicaid formulary) if patient is willing to start  Uncontrolled type 2 diabetes mellitus, improving - Continue Lantus 55 u BID - Novolog 6 u WC - SSI resistant - metformin 500 mg daily, titrate further with PCP - consider initiation of Victoza prior to discharge vs initiation with PCP   Yeast infection, resolved Patient reports resolution of yeast infection symptoms after treatment with fluconazole 150 mg x 2.  Abdominal pain Stable.  - continue protonix 40 mg daily, metoclopramide 5 mg TID - sucralfate 1g TID  - avoid NSAIDS  Normocytic anemia. Hgb 9.2 this morning. No signs of blood loss at this  time. Will monitor closely. Iron studies suggest a component of iron deficiency as well. Consider starting iron supplementation however suspect that her GI symptoms will not tolerate this. - AM CBC  Diet: Carb-Modified IVF: None VTE: Enoxaparin Code: Full  , MD Internal Medicine Resident, PGY-1 Alphonzo Severance Internal Medicine Residency Pager: [phone] 6:26 AM, 10/03/2020

## 2020-10-03 NOTE — Progress Notes (Signed)
Pt. Placed on 6L [place] while using bathroom

## 2020-10-03 NOTE — Plan of Care (Signed)
Patient is currently resting in bed. OOB to chair. Voiding. VSS. 6L [place] or Bipap when sleeping. C/o headache this AM, given tylenol PRN. Call bell within reach.  Problem: Education: Goal: Ability to describe self-care measures that may prevent or decrease complications (Diabetes Survival Skills Education) will improve Outcome: Progressing Goal: Individualized Educational Video(s) Outcome: Progressing   Problem: Coping: Goal: Ability to adjust to condition or change in health will improve Outcome: Progressing   Problem: Fluid Volume: Goal: Ability to maintain a balanced intake and output will improve Outcome: Progressing   Problem: Health Behavior/Discharge Planning: Goal: Ability to identify and utilize available resources and services will improve Outcome: Progressing Goal: Ability to manage health-related needs will improve Outcome: Progressing   Problem: Metabolic: Goal: Ability to maintain appropriate glucose levels will improve Outcome: Progressing   Problem: Nutritional: Goal: Maintenance of adequate nutrition will improve Outcome: Progressing Goal: Progress toward achieving an optimal weight will improve Outcome: Progressing   Problem: Skin Integrity: Goal: Risk for impaired skin integrity will decrease Outcome: Progressing   Problem: Tissue Perfusion: Goal: Adequacy of tissue perfusion will improve Outcome: Progressing

## 2020-10-03 NOTE — Progress Notes (Signed)
RT note. Pt. Found on BIPAP at this time

## 2020-10-04 ENCOUNTER — Inpatient Hospital Stay (HOSPITAL_COMMUNITY): Payer: Medicaid Other

## 2020-10-04 DIAGNOSIS — R1030 Lower abdominal pain, unspecified: Secondary | ICD-10-CM | POA: Diagnosis not present

## 2020-10-04 DIAGNOSIS — R1013 Epigastric pain: Secondary | ICD-10-CM | POA: Diagnosis not present

## 2020-10-04 DIAGNOSIS — D649 Anemia, unspecified: Secondary | ICD-10-CM | POA: Diagnosis not present

## 2020-10-04 DIAGNOSIS — I5033 Acute on chronic diastolic (congestive) heart failure: Secondary | ICD-10-CM | POA: Diagnosis not present

## 2020-10-04 DIAGNOSIS — K760 Fatty (change of) liver, not elsewhere classified: Secondary | ICD-10-CM | POA: Diagnosis not present

## 2020-10-04 DIAGNOSIS — J45901 Unspecified asthma with (acute) exacerbation: Secondary | ICD-10-CM | POA: Diagnosis not present

## 2020-10-04 DIAGNOSIS — I110 Hypertensive heart disease with heart failure: Secondary | ICD-10-CM | POA: Diagnosis not present

## 2020-10-04 DIAGNOSIS — G4733 Obstructive sleep apnea (adult) (pediatric): Secondary | ICD-10-CM | POA: Diagnosis not present

## 2020-10-04 DIAGNOSIS — Z8616 Personal history of COVID-19: Secondary | ICD-10-CM | POA: Diagnosis not present

## 2020-10-04 DIAGNOSIS — J9601 Acute respiratory failure with hypoxia: Secondary | ICD-10-CM | POA: Diagnosis not present

## 2020-10-04 DIAGNOSIS — F32A Depression, unspecified: Secondary | ICD-10-CM | POA: Diagnosis not present

## 2020-10-04 LAB — BLOOD GAS, ARTERIAL
Acid-Base Excess: 13.6 mmol/L — ABNORMAL HIGH (ref 0.0–2.0)
Bicarbonate: 39.2 mmol/L — ABNORMAL HIGH (ref 20.0–28.0)
Drawn by: 60057
FIO2: 28
O2 Saturation: 91.4 %
Patient temperature: 36.9
pCO2 arterial: 66.8 mmHg (ref 32.0–48.0)
pH, Arterial: 7.386 (ref 7.350–7.450)
pO2, Arterial: 65.1 mmHg — ABNORMAL LOW (ref 83.0–108.0)

## 2020-10-04 LAB — CBC WITH DIFFERENTIAL/PLATELET
Abs Immature Granulocytes: 0.04 10*3/uL (ref 0.00–0.07)
Basophils Absolute: 0 10*3/uL (ref 0.0–0.1)
Basophils Relative: 0 %
Eosinophils Absolute: 0.4 10*3/uL (ref 0.0–0.5)
Eosinophils Relative: 4 %
HCT: 33 % — ABNORMAL LOW (ref 36.0–46.0)
Hemoglobin: 9.5 g/dL — ABNORMAL LOW (ref 12.0–15.0)
Immature Granulocytes: 0 %
Lymphocytes Relative: 16 %
Lymphs Abs: 1.5 10*3/uL (ref 0.7–4.0)
MCH: 26.3 pg (ref 26.0–34.0)
MCHC: 28.8 g/dL — ABNORMAL LOW (ref 30.0–36.0)
MCV: 91.4 fL (ref 80.0–100.0)
Monocytes Absolute: 1 10*3/uL (ref 0.1–1.0)
Monocytes Relative: 11 %
Neutro Abs: 6.4 10*3/uL (ref 1.7–7.7)
Neutrophils Relative %: 69 %
Platelets: 294 10*3/uL (ref 150–400)
RBC: 3.61 MIL/uL — ABNORMAL LOW (ref 3.87–5.11)
RDW: 14.9 % (ref 11.5–15.5)
WBC: 9.3 10*3/uL (ref 4.0–10.5)
nRBC: 0 % (ref 0.0–0.2)

## 2020-10-04 LAB — BASIC METABOLIC PANEL
Anion gap: 8 (ref 5–15)
BUN: 6 mg/dL (ref 6–20)
CO2: 39 mmol/L — ABNORMAL HIGH (ref 22–32)
Calcium: 9.3 mg/dL (ref 8.9–10.3)
Chloride: 90 mmol/L — ABNORMAL LOW (ref 98–111)
Creatinine, Ser: 0.63 mg/dL (ref 0.44–1.00)
GFR, Estimated: >60 mL/min (ref >60)
Glucose, Bld: 80 mg/dL (ref 70–99)
Potassium: 3.5 mmol/L (ref 3.5–5.1)
Sodium: 137 mmol/L (ref 135–145)

## 2020-10-04 LAB — GLUCOSE, CAPILLARY
Glucose-Capillary: 137 mg/dL — ABNORMAL HIGH (ref 70–99)
Glucose-Capillary: 114 mg/dL — ABNORMAL HIGH (ref 70–99)
Glucose-Capillary: 228 mg/dL — ABNORMAL HIGH (ref 70–99)
Glucose-Capillary: 188 mg/dL — ABNORMAL HIGH (ref 70–99)

## 2020-10-04 LAB — MAGNESIUM: Magnesium: 1.9 mg/dL (ref 1.7–2.4)

## 2020-10-04 MED ORDER — BENZONATATE 100 MG PO CAPS
200.0000 mg | ORAL_CAPSULE | Freq: Once | ORAL | Status: AC
  Administered 2020-10-04: 200 mg via ORAL
  Filled 2020-10-04: qty 2

## 2020-10-04 MED ORDER — INSULIN GLARGINE 100 UNIT/ML ~~LOC~~ SOLN
50.0000 [IU] | Freq: Two times a day (BID) | SUBCUTANEOUS | Status: DC
  Administered 2020-10-04 – 2020-10-05 (×3): 50 [IU] via SUBCUTANEOUS
  Filled 2020-10-04 (×4): qty 0.5

## 2020-10-04 MED ORDER — IPRATROPIUM-ALBUTEROL 0.5-2.5 (3) MG/3ML IN SOLN
3.0000 mL | Freq: Three times a day (TID) | RESPIRATORY_TRACT | Status: DC
  Administered 2020-10-04 – 2020-10-05 (×5): 3 mL via RESPIRATORY_TRACT
  Filled 2020-10-04 (×5): qty 3

## 2020-10-04 MED ORDER — TORSEMIDE 20 MG PO TABS
40.0000 mg | ORAL_TABLET | Freq: Two times a day (BID) | ORAL | Status: AC
  Administered 2020-10-04 (×2): 40 mg via ORAL
  Filled 2020-10-04 (×2): qty 2

## 2020-10-04 NOTE — Progress Notes (Signed)
PT Cancellation Note  Patient Details Name: Yvette Gibbs MRN: [id number] DOB: Sep 11, 1990   Cancelled Treatment:    Reason Eval/Treat Not Completed: Other (comment). Pt off the floor.    Angelina Ok [facility] 10/04/2020, 3:14 PM

## 2020-10-04 NOTE — Progress Notes (Addendum)
HD#6 Subjective:   No acute events overnight. No BiPap overnight given need to obtain early morning ABG. Patient reported cough for which she was given tessalon perles by the night team. During evaluation this morning, she states she is sleepy but thinking clearly. Reports she slept poorly due to monitor beeping and periodic cough, endorses headache. She reports she has had a cough for the last few days but that it is getting better.  Returned to bedside with team later in the morning, and patient sleeping on our arrival. She aroused to voice but was drowsy, audibly wheezing. Prior to our arrival she was taken for pulmonary function testing which she was unable to tolerate due to dyspnea.  Objective:   Vital signs in last 24 hours: Vitals:   10/03/20 1217 10/03/20 1700 10/03/20 2030 10/04/20 0425  BP:  (!) 147/86 122/83 (!) 126/56  Pulse: 100 100 100 97  Resp: 20 18 20 20   Temp: 98 F (36.7 C) 98.8 F (37.1 C) 98.7 F (37.1 C) 98.4 F (36.9 C)  TempSrc: Oral Oral Oral Oral  SpO2:  98% 99% 100%  Weight:      Height:       Physical Exam Constitutional: morbidly obese, lying in bed, on 2 L [place], sleepy Cardiac: tachycardic, regular rhythm Pulm: slightly increased work of breathing on 2 L [place], patient speaks in full sentences, diminished lung sounds throughout, transmitted upper airway wheezing throughout Abdomen: softer than before, non-tender to palpation Extremities: legs slightly softer compared to yesterday  Filed Weights   09/28/20 0250 09/28/20 0849 10/03/20 0425  Weight: (!) 190 kg (!) 190 kg (!) 185.3 kg    Intake/Output Summary (Last 24 hours) at 10/04/2020 0704 Last data filed at 10/04/2020 0133 Gross per 24 hour  Intake 120 ml  Output 1500 ml  Net -1380 ml   Net IO Since Admission: -7,135.88 mL [10/04/20 0704]  Imaging: no new imaging    Assessment/Plan:   Principal Problem:   Heart failure with preserved ejection fraction (HCC) Active Problems:    Morbid obesity (HCC)   OSA (obstructive sleep apnea)   Abdominal pain   HTN (hypertension)   Proteinuria   Type 2 diabetes mellitus without complication, without long-term current use of insulin (HCC)   Gastroesophageal reflux disease without esophagitis   Asthma exacerbation   Obesity hypoventilation syndrome (HCC)   Acute respiratory failure with hypoxia (HCC)   Normocytic anemia   Mild concentric left ventricular hypertrophy (LVH)   NAFLD (nonalcoholic fatty liver disease)   Patient Summary:  Yvette Gibbs is a 30 year old woman with a history asthma, depression, HTN, recent COVID infection, morbid obesity, and diabetes who presented with shortness of breath and abdominal pain and admitted for acute hypoxic respiratory failure which is suspected to be secondary to possible diastolic heart failure from untreated OSA/OHS  This is hospital day 6.   Acute hypoxic respiratory failure 2/2 diastolic heart failure Suspected underlying OHS/OSA Patient drowsy this morning. Although now maintaining oxygen saturation on 2 L [place], she was unable to tolerate pulmonary function testing this morning. ABG yesterday with pCO2 59.4. ABG this morning upon waking up with pCO2 66.8. Unfortunately urine output has not been recorded consistently. Assessment of volume status is complicated by body habitus. Will schedule Duonebs and trial torsemide 40 mg PO twice daily today and attempt repeat PFTs. - torsemide 40 mg BID  - ABG pCO2 criteria met for home BiPap; need PFTs to assess patient's eligibility for home BiPap -  continue BiPap PRN - patient qualifies for home oxygen as above, order placed - Order for [facility] PT/OT and bariatric rollator placed  Uncontrolled HTN, improving Likely in the setting of untreated OSA/OHS, hypervolemia. BP improving with diuresis.  - Diuresis as above - continue irbasartan 121m daily, amlodipine 10 mg daily - consider transition to olmesartan-amlodipine at discharge (on  medicaid formulary) if patient is willing to start  Uncontrolled type 2 diabetes mellitus, improving Fasting glucose 80 this morning. Will decrease Lantus. - Lantus 55 u BID -> 50 u BID - Novolog 6 u WC - SSI resistant - metformin 500 mg daily, titrate further with PCP - consider initiation of Victoza prior to discharge vs initiation with PCP   Abdominal pain Stable. Continue protonix 40 mg daily, metoclopramide 5 mg TID, sucralfate 1g TID, avoid NSAIDS.  Normocytic anemia. Hgb 9.5 this morning. No signs of blood loss at this time. Will monitor closely. Iron studies suggest a component of iron deficiency as well. Consider starting iron supplementation however suspect that her GI symptoms will not tolerate this. - AM CBC  Diet: Carb-Modified IVF: None VTE: Enoxaparin Code: Full  JAlexandria Lodge MD Internal Medicine Resident, PGY-1 [facility] Pager: [phone] AM, 10/04/2020

## 2020-10-04 NOTE — Progress Notes (Signed)
IM resident paged about patients pCO2 arterial results. No response yet.

## 2020-10-04 NOTE — Plan of Care (Signed)
Patient is currently resting in bed. C/o cough overnight, given med. VSS. 2L when awake. Bipap when sleeping. OOB ambulating to [facility]. Call bell within reach.  Problem: Education: Goal: Ability to describe self-care measures that may prevent or decrease complications (Diabetes Survival Skills Education) will improve Outcome: Progressing Goal: Individualized Educational Video(s) Outcome: Progressing   Problem: Coping: Goal: Ability to adjust to condition or change in health will improve Outcome: Progressing   Problem: Fluid Volume: Goal: Ability to maintain a balanced intake and output will improve Outcome: Progressing   Problem: Health Behavior/Discharge Planning: Goal: Ability to identify and utilize available resources and services will improve Outcome: Progressing Goal: Ability to manage health-related needs will improve Outcome: Progressing   Problem: Metabolic: Goal: Ability to maintain appropriate glucose levels will improve Outcome: Progressing   Problem: Nutritional: Goal: Maintenance of adequate nutrition will improve Outcome: Progressing Goal: Progress toward achieving an optimal weight will improve Outcome: Progressing   Problem: Skin Integrity: Goal: Risk for impaired skin integrity will decrease Outcome: Progressing   Problem: Tissue Perfusion: Goal: Adequacy of tissue perfusion will improve Outcome: Progressing

## 2020-10-04 NOTE — Progress Notes (Signed)
4/22-Attempted to perform a Pulmonary function test with patient. She was wheezing at time of test and too short of breath to perform PFT.  Several attempts made but pt short of breath when supplimental oxygen removed for test maneuver.

## 2020-10-04 NOTE — Progress Notes (Signed)
Dr. Claudette Laws shared with me that the patient wanted to leave AMA.  I went and spoke with the patient.  She appeared upset.  Upon speaking with her she expressed that she felt trapped and was extremely uncomfortable in the bed in her room.  She stated that she wanted to go outside and breathe fresh air.  I ordered a bariatric bed and received authorization to take the patient outside for fresh air.  Upon return to her room the patient agreed to stay until Monday

## 2020-10-05 DIAGNOSIS — R1030 Lower abdominal pain, unspecified: Secondary | ICD-10-CM | POA: Diagnosis not present

## 2020-10-05 DIAGNOSIS — J45901 Unspecified asthma with (acute) exacerbation: Secondary | ICD-10-CM | POA: Diagnosis not present

## 2020-10-05 DIAGNOSIS — Z8616 Personal history of COVID-19: Secondary | ICD-10-CM | POA: Diagnosis not present

## 2020-10-05 DIAGNOSIS — K760 Fatty (change of) liver, not elsewhere classified: Secondary | ICD-10-CM | POA: Diagnosis not present

## 2020-10-05 DIAGNOSIS — J9601 Acute respiratory failure with hypoxia: Secondary | ICD-10-CM | POA: Diagnosis not present

## 2020-10-05 DIAGNOSIS — D649 Anemia, unspecified: Secondary | ICD-10-CM | POA: Diagnosis not present

## 2020-10-05 DIAGNOSIS — I110 Hypertensive heart disease with heart failure: Secondary | ICD-10-CM | POA: Diagnosis not present

## 2020-10-05 DIAGNOSIS — R1013 Epigastric pain: Secondary | ICD-10-CM | POA: Diagnosis not present

## 2020-10-05 DIAGNOSIS — F32A Depression, unspecified: Secondary | ICD-10-CM | POA: Diagnosis not present

## 2020-10-05 DIAGNOSIS — I5033 Acute on chronic diastolic (congestive) heart failure: Secondary | ICD-10-CM | POA: Diagnosis not present

## 2020-10-05 DIAGNOSIS — G4733 Obstructive sleep apnea (adult) (pediatric): Secondary | ICD-10-CM | POA: Diagnosis not present

## 2020-10-05 LAB — CBC WITH DIFFERENTIAL/PLATELET
Abs Immature Granulocytes: 0.07 10*3/uL (ref 0.00–0.07)
Basophils Absolute: 0 10*3/uL (ref 0.0–0.1)
Basophils Relative: 0 %
Eosinophils Absolute: 0.3 10*3/uL (ref 0.0–0.5)
Eosinophils Relative: 4 %
HCT: 32.8 % — ABNORMAL LOW (ref 36.0–46.0)
Hemoglobin: 9.4 g/dL — ABNORMAL LOW (ref 12.0–15.0)
Immature Granulocytes: 1 %
Lymphocytes Relative: 19 %
Lymphs Abs: 1.6 10*3/uL (ref 0.7–4.0)
MCH: 26 pg (ref 26.0–34.0)
MCHC: 28.7 g/dL — ABNORMAL LOW (ref 30.0–36.0)
MCV: 90.9 fL (ref 80.0–100.0)
Monocytes Absolute: 0.8 10*3/uL (ref 0.1–1.0)
Monocytes Relative: 10 %
Neutro Abs: 5.5 10*3/uL (ref 1.7–7.7)
Neutrophils Relative %: 66 %
Platelets: 313 10*3/uL (ref 150–400)
RBC: 3.61 MIL/uL — ABNORMAL LOW (ref 3.87–5.11)
RDW: 15 % (ref 11.5–15.5)
WBC: 8.4 10*3/uL (ref 4.0–10.5)
nRBC: 0 % (ref 0.0–0.2)

## 2020-10-05 LAB — BASIC METABOLIC PANEL
Anion gap: 9 (ref 5–15)
BUN: 8 mg/dL (ref 6–20)
CO2: 39 mmol/L — ABNORMAL HIGH (ref 22–32)
Calcium: 9.3 mg/dL (ref 8.9–10.3)
Chloride: 88 mmol/L — ABNORMAL LOW (ref 98–111)
Creatinine, Ser: 0.72 mg/dL (ref 0.44–1.00)
GFR, Estimated: >60 mL/min (ref >60)
Glucose, Bld: 186 mg/dL — ABNORMAL HIGH (ref 70–99)
Potassium: 3.9 mmol/L (ref 3.5–5.1)
Sodium: 136 mmol/L (ref 135–145)

## 2020-10-05 LAB — GLUCOSE, CAPILLARY
Glucose-Capillary: 111 mg/dL — ABNORMAL HIGH (ref 70–99)
Glucose-Capillary: 190 mg/dL — ABNORMAL HIGH (ref 70–99)
Glucose-Capillary: 214 mg/dL — ABNORMAL HIGH (ref 70–99)
Glucose-Capillary: 165 mg/dL — ABNORMAL HIGH (ref 70–99)

## 2020-10-05 LAB — MAGNESIUM: Magnesium: 1.7 mg/dL (ref 1.7–2.4)

## 2020-10-05 MED ORDER — GUAIFENESIN 100 MG/5ML PO SOLN
5.0000 mL | ORAL | Status: DC | PRN
  Administered 2020-10-05 – 2020-10-06 (×2): 100 mg via ORAL
  Filled 2020-10-05 (×2): qty 5

## 2020-10-05 MED ORDER — FLUTICASONE PROPIONATE 50 MCG/ACT NA SUSP
1.0000 | Freq: Every day | NASAL | Status: DC
  Administered 2020-10-05 – 2020-10-08 (×4): 1 via NASAL
  Filled 2020-10-05: qty 16

## 2020-10-05 MED ORDER — TORSEMIDE 20 MG PO TABS
40.0000 mg | ORAL_TABLET | Freq: Two times a day (BID) | ORAL | Status: AC
  Administered 2020-10-05 (×2): 40 mg via ORAL
  Filled 2020-10-05 (×2): qty 2

## 2020-10-05 NOTE — Plan of Care (Signed)

## 2020-10-05 NOTE — Progress Notes (Signed)
HD#7 Subjective:   No acute events overnight. During evaluation this morning, patient states she is feeling overall better than she did yesterday. Wore BiPap overnight. She states she has been having a hard time being in the hospital away from her family, especially her children. She reports that after speaking with family and friends yesterday, she realizes it is important to stay in the hospital to get better. She states she realizes she can't take care of her children if she isn't taking care of herself. She endorses persistent cough. Feels her belly and legs are softer than before.  Objective:   Vital signs in last 24 hours: Vitals:   10/04/20 2104 10/05/20 0025 10/05/20 0401 10/05/20 0506  BP: (!) 145/72   (!) 145/89  Pulse: (!) 101 100 89 86  Resp: 20 (!) 21 (!) 27 19  Temp: 98.2 F (36.8 C)   98.2 F (36.8 C)  TempSrc: Oral   Axillary  SpO2: 94% 98% 100% 100%  Weight:    (!) 171.9 kg  Height:       Physical Exam Constitutional: morbidly obese, lying in bed, on 2 L [place], more awake and engaged today Cardiac: tachycardic, regular rhythm Pulm: normal work of breathing on 2 L [place], patient speaks in full sentences, diminished lung sounds throughout, scattered faint end-expiratory wheezes throughout Abdomen: soft, non-tender to palpation Extremities: legs slightly softer compared to yesterday  Filed Weights   10/03/20 0425 10/04/20 1800 10/05/20 0506  Weight: (!) 185.3 kg (!) 178.3 kg (!) 171.9 kg    Intake/Output Summary (Last 24 hours) at 10/05/2020 0621 Last data filed at 10/05/2020 0506 Gross per 24 hour  Intake --  Output 2650 ml  Net -2650 ml   Net IO Since Admission: -9,785.88 mL [10/05/20 0621]  Imaging: no new imaging  Assessment/Plan:   Principal Problem:   Heart failure with preserved ejection fraction (HCC) Active Problems:   Morbid obesity (HCC)   OSA (obstructive sleep apnea)   Abdominal pain   HTN (hypertension)   Proteinuria   Type 2 diabetes  mellitus without complication, without long-term current use of insulin (HCC)   Gastroesophageal reflux disease without esophagitis   Asthma exacerbation   Obesity hypoventilation syndrome (HCC)   Acute respiratory failure with hypoxia (HCC)   Normocytic anemia   Mild concentric left ventricular hypertrophy (LVH)   NAFLD (nonalcoholic fatty liver disease)   Patient Summary:  Yvette Gibbs is a 30 year old woman with a history asthma, depression, HTN, recent COVID infection, morbid obesity, and diabetes who presented with shortness of breath and abdominal pain and admitted for acute hypoxic respiratory failure which is suspected to be secondary to possible diastolic heart failure from untreated OSA/OHS  This is hospital day 7.   Acute hypoxic respiratory failure 2/2 diastolic heart failure Suspected underlying OHS/OSA Patient awake, alert, and breathing more comfortably this morning. Wore BiPap overnight. On 2 L [place] at rest. Unable to repeat PFTs yesterday due to scheduling conflict in the lab, however patient is scheduled for repeat PFT Monday. Patient is understanding of need to remain hospitalized until further testing Monday so that she can hopefully she can qualify for nightly BiPap. - torsemide 40 mg BID  - ABG pCO2 criteria met for home BiPap; need PFTs to assess patient's eligibility for home BiPap, scheduled for 4/25 - continue BiPap PRN - patient qualifies for home oxygen as above, order placed - Order for [facility] PT/OT and bariatric rollator placed  Uncontrolled HTN, improving Likely in the  setting of untreated OSA/OHS, hypervolemia.  - Diuresis as above - continue irbasartan 118m daily, amlodipine 10 mg daily - consider transition to olmesartan-amlodipine at discharge (on medicaid formulary) if patient is willing to start  Uncontrolled type 2 diabetes mellitus, improving - Lantus 50 u BID - Novolog 6 u WC - SSI resistant - metformin 500 mg daily day 6, will increase to BID  dosing on 4/25 - consider initiation of Victoza prior to discharge vs initiation with PCP   Abdominal pain Stable. Continue protonix 40 mg daily, metoclopramide 5 mg TID, sucralfate 1g TID, avoid NSAIDS.  Normocytic anemia. Stable. No signs of blood loss at this time. Will monitor closely. Iron studies suggest a component of iron deficiency as well. Consider starting iron supplementation however suspect that her GI symptoms will not tolerate this. - AM CBC  Diet: Carb-Modified IVF: None VTE: Enoxaparin Code: Full  JAlexandria Lodge MD Internal Medicine Resident, PGY-1 [facility] Pager: [phone] AM, 10/05/2020

## 2020-10-06 DIAGNOSIS — F32A Depression, unspecified: Secondary | ICD-10-CM | POA: Diagnosis not present

## 2020-10-06 DIAGNOSIS — K760 Fatty (change of) liver, not elsewhere classified: Secondary | ICD-10-CM | POA: Diagnosis not present

## 2020-10-06 DIAGNOSIS — I110 Hypertensive heart disease with heart failure: Secondary | ICD-10-CM | POA: Diagnosis not present

## 2020-10-06 DIAGNOSIS — J9601 Acute respiratory failure with hypoxia: Secondary | ICD-10-CM | POA: Diagnosis not present

## 2020-10-06 DIAGNOSIS — I5033 Acute on chronic diastolic (congestive) heart failure: Secondary | ICD-10-CM | POA: Diagnosis not present

## 2020-10-06 DIAGNOSIS — R1030 Lower abdominal pain, unspecified: Secondary | ICD-10-CM | POA: Diagnosis not present

## 2020-10-06 DIAGNOSIS — D649 Anemia, unspecified: Secondary | ICD-10-CM | POA: Diagnosis not present

## 2020-10-06 DIAGNOSIS — J45901 Unspecified asthma with (acute) exacerbation: Secondary | ICD-10-CM | POA: Diagnosis not present

## 2020-10-06 DIAGNOSIS — R1013 Epigastric pain: Secondary | ICD-10-CM | POA: Diagnosis not present

## 2020-10-06 DIAGNOSIS — Z8616 Personal history of COVID-19: Secondary | ICD-10-CM | POA: Diagnosis not present

## 2020-10-06 DIAGNOSIS — G4733 Obstructive sleep apnea (adult) (pediatric): Secondary | ICD-10-CM | POA: Diagnosis not present

## 2020-10-06 LAB — CBC WITH DIFFERENTIAL/PLATELET
Abs Immature Granulocytes: 0.09 10*3/uL — ABNORMAL HIGH (ref 0.00–0.07)
Basophils Absolute: 0 10*3/uL (ref 0.0–0.1)
Basophils Relative: 0 %
Eosinophils Absolute: 0.3 10*3/uL (ref 0.0–0.5)
Eosinophils Relative: 3 %
HCT: 35.5 % — ABNORMAL LOW (ref 36.0–46.0)
Hemoglobin: 10.3 g/dL — ABNORMAL LOW (ref 12.0–15.0)
Immature Granulocytes: 1 %
Lymphocytes Relative: 18 %
Lymphs Abs: 1.7 10*3/uL (ref 0.7–4.0)
MCH: 26.1 pg (ref 26.0–34.0)
MCHC: 29 g/dL — ABNORMAL LOW (ref 30.0–36.0)
MCV: 90.1 fL (ref 80.0–100.0)
Monocytes Absolute: 0.9 10*3/uL (ref 0.1–1.0)
Monocytes Relative: 10 %
Neutro Abs: 6.5 10*3/uL (ref 1.7–7.7)
Neutrophils Relative %: 68 %
Platelets: 362 10*3/uL (ref 150–400)
RBC: 3.94 MIL/uL (ref 3.87–5.11)
RDW: 14.9 % (ref 11.5–15.5)
WBC: 9.6 10*3/uL (ref 4.0–10.5)
nRBC: 0 % (ref 0.0–0.2)

## 2020-10-06 LAB — GLUCOSE, CAPILLARY
Glucose-Capillary: 206 mg/dL — ABNORMAL HIGH (ref 70–99)
Glucose-Capillary: 140 mg/dL — ABNORMAL HIGH (ref 70–99)
Glucose-Capillary: 183 mg/dL — ABNORMAL HIGH (ref 70–99)
Glucose-Capillary: 123 mg/dL — ABNORMAL HIGH (ref 70–99)
Glucose-Capillary: 154 mg/dL — ABNORMAL HIGH (ref 70–99)

## 2020-10-06 LAB — BASIC METABOLIC PANEL
Anion gap: 10 (ref 5–15)
BUN: 13 mg/dL (ref 6–20)
CO2: 38 mmol/L — ABNORMAL HIGH (ref 22–32)
Calcium: 9.6 mg/dL (ref 8.9–10.3)
Chloride: 87 mmol/L — ABNORMAL LOW (ref 98–111)
Creatinine, Ser: 0.79 mg/dL (ref 0.44–1.00)
GFR, Estimated: >60 mL/min (ref >60)
Glucose, Bld: 243 mg/dL — ABNORMAL HIGH (ref 70–99)
Potassium: 4.2 mmol/L (ref 3.5–5.1)
Sodium: 135 mmol/L (ref 135–145)

## 2020-10-06 LAB — MAGNESIUM: Magnesium: 1.6 mg/dL — ABNORMAL LOW (ref 1.7–2.4)

## 2020-10-06 MED ORDER — TORSEMIDE 20 MG PO TABS
40.0000 mg | ORAL_TABLET | Freq: Two times a day (BID) | ORAL | Status: AC
  Administered 2020-10-06 (×2): 40 mg via ORAL
  Filled 2020-10-06 (×2): qty 2

## 2020-10-06 MED ORDER — MAGNESIUM SULFATE IN D5W 1-5 GM/100ML-% IV SOLN
1.0000 g | Freq: Once | INTRAVENOUS | Status: AC
  Administered 2020-10-06: 1 g via INTRAVENOUS
  Filled 2020-10-06: qty 100

## 2020-10-06 MED ORDER — CYCLOBENZAPRINE HCL 5 MG PO TABS: 5.0000 mg | ORAL_TABLET | Freq: Once | ORAL | Status: DC | PRN

## 2020-10-06 MED ORDER — INSULIN GLARGINE 100 UNIT/ML ~~LOC~~ SOLN
55.0000 [IU] | Freq: Two times a day (BID) | SUBCUTANEOUS | Status: DC
  Administered 2020-10-06 – 2020-10-08 (×5): 55 [IU] via SUBCUTANEOUS
  Filled 2020-10-06 (×6): qty 0.55

## 2020-10-06 MED ORDER — IPRATROPIUM-ALBUTEROL 0.5-2.5 (3) MG/3ML IN SOLN
3.0000 mL | Freq: Two times a day (BID) | RESPIRATORY_TRACT | Status: DC
  Administered 2020-10-06 – 2020-10-08 (×4): 3 mL via RESPIRATORY_TRACT
  Filled 2020-10-06 (×5): qty 3

## 2020-10-06 MED ORDER — METFORMIN HCL 500 MG PO TABS
500.0000 mg | ORAL_TABLET | Freq: Two times a day (BID) | ORAL | Status: DC
  Administered 2020-10-07 – 2020-10-08 (×3): 500 mg via ORAL
  Filled 2020-10-06 (×3): qty 1

## 2020-10-06 NOTE — Progress Notes (Signed)
Have been educating patient over the last 3 days about insulin administration and have taught her how to check her blood sugars.  Patient is now very confident and can check her own blood sugar and administer her own insulin.

## 2020-10-06 NOTE — Progress Notes (Signed)
HD#8 Subjective:   No acute events overnight. During evaluation this morning, patient states she feels "pretty good." Reports yesterday she was able to walk around her room and to the bathroom without oxygen and with only minimum shortness of breath. States she wore her BiPap overnight and recently woke up. Denies headache. Endorses intermittent lower extremity cramping.  Objective:   Vital signs in last 24 hours: Vitals:   10/05/20 2018 10/05/20 2106 10/06/20 0003 10/06/20 0521  BP: (!) 116/52   136/83  Pulse: 100  96 87  Resp: 20  (!) 24 19  Temp: 99.5 F (37.5 C)   98.3 F (36.8 C)  TempSrc: Oral   Oral  SpO2: 99% 100% 99% 100%  Weight:      Height:       Physical Exam Constitutional: morbidly obese, lying in bed, on 2 L [place], awake and engaged in exam Cardiac: regular rate and rhythm Pulm: normal work of breathing on 2 L [place], patient speaks in full sentences, diminished lung sounds throughout Abdomen: soft, non-tender to palpation  Filed Weights   10/03/20 0425 10/04/20 1800 10/05/20 0506  Weight: (!) 185.3 kg (!) 178.3 kg (!) 171.9 kg    Intake/Output Summary (Last 24 hours) at 10/06/2020 0620 Last data filed at 10/05/2020 1031 Gross per 24 hour  Intake 240 ml  Output --  Net 240 ml   Net IO Since Admission: -9,545.88 mL [10/06/20 0620]  Imaging: no new imaging  Assessment/Plan:   Principal Problem:   Heart failure with preserved ejection fraction (HCC) Active Problems:   Morbid obesity (HCC)   OSA (obstructive sleep apnea)   Abdominal pain   HTN (hypertension)   Proteinuria   Type 2 diabetes mellitus without complication, without long-term current use of insulin (HCC)   Gastroesophageal reflux disease without esophagitis   Asthma exacerbation   Obesity hypoventilation syndrome (HCC)   Acute respiratory failure with hypoxia (HCC)   Normocytic anemia   Mild concentric left ventricular hypertrophy (LVH)   NAFLD (nonalcoholic fatty liver  disease)   Patient Summary:  Yvette Gibbs is a 30 year old woman with a history asthma, depression, HTN, recent COVID infection, morbid obesity, and diabetes who presented with shortness of breath and abdominal pain and admitted for acute hypoxic respiratory failure which is suspected to be secondary to possible diastolic heart failure from untreated OSA/OHS  This is hospital day 8.   Acute hypoxic respiratory failure 2/2 diastolic heart failure Suspected underlying OHS/OSA Patient awake, alert, and breathing comfortably this morning. Wore BiPap overnight. On 2 L [place] at rest however reports was able to ambulate comfortably without oxygen yesterday. Plan for repeat PFT Monday so that she can hopefully qualify for nightly BiPap at discharge. Will repeat ambulation with pulse ox prior to discharge as well. Mild hypomagnesemia (1.6) this morning, will replete intravenously. - magnesium sulfate 1 g IV - torsemide 40 mg BID  - ABG pCO2 criteria met for home BiPap; need PFTs to assess patient's eligibility for home BiPap, scheduled for tomorrow, 4/25 - continue BiPap PRN - patient qualifies for home oxygen as above, order placed - Order for [facility] PT/OT and bariatric rollator placed  Uncontrolled HTN, improving Likely in the setting of untreated OSA/OHS, hypervolemia.  - Diuresis as above - continue irbasartan 194m daily, amlodipine 10 mg daily - consider transition to olmesartan-amlodipine at discharge (on medicaid formulary) if patient is willing to start  Uncontrolled type 2 diabetes mellitus, improving Will increase Lantus back to 55 u BID  given fasting blood glucose of 243 this morning. - Lantus 55 u BID - Novolog 6 u WC - SSI resistant - metformin 500 mg daily day 7, will increase to BID dosing tomorrow - consider initiation of Victoza prior to discharge vs initiation with PCP   Abdominal pain Stable. Continue protonix 40 mg daily, metoclopramide 5 mg TID, sucralfate 1g TID, avoid  NSAIDS.  Normocytic anemia. Stable. No signs of blood loss at this time. Will monitor closely. Iron studies suggest a component of iron deficiency as well. Consider starting iron supplementation however suspect that her GI symptoms will not tolerate this. - AM CBC  Diet: Carb-Modified IVF: None VTE: Enoxaparin Code: Full  Alexandria Lodge, MD Internal Medicine Resident, PGY-1 [facility] Pager: [phone] 6:20 AM, 10/06/2020

## 2020-10-07 ENCOUNTER — Inpatient Hospital Stay (HOSPITAL_COMMUNITY): Payer: Medicaid Other

## 2020-10-07 DIAGNOSIS — D649 Anemia, unspecified: Secondary | ICD-10-CM | POA: Diagnosis not present

## 2020-10-07 DIAGNOSIS — J9601 Acute respiratory failure with hypoxia: Secondary | ICD-10-CM | POA: Diagnosis not present

## 2020-10-07 DIAGNOSIS — K760 Fatty (change of) liver, not elsewhere classified: Secondary | ICD-10-CM | POA: Diagnosis not present

## 2020-10-07 DIAGNOSIS — G4733 Obstructive sleep apnea (adult) (pediatric): Secondary | ICD-10-CM | POA: Diagnosis not present

## 2020-10-07 DIAGNOSIS — I110 Hypertensive heart disease with heart failure: Secondary | ICD-10-CM | POA: Diagnosis not present

## 2020-10-07 DIAGNOSIS — F32A Depression, unspecified: Secondary | ICD-10-CM | POA: Diagnosis not present

## 2020-10-07 DIAGNOSIS — J45901 Unspecified asthma with (acute) exacerbation: Secondary | ICD-10-CM | POA: Diagnosis not present

## 2020-10-07 DIAGNOSIS — R1030 Lower abdominal pain, unspecified: Secondary | ICD-10-CM | POA: Diagnosis not present

## 2020-10-07 DIAGNOSIS — I5033 Acute on chronic diastolic (congestive) heart failure: Secondary | ICD-10-CM | POA: Diagnosis not present

## 2020-10-07 DIAGNOSIS — Z8616 Personal history of COVID-19: Secondary | ICD-10-CM | POA: Diagnosis not present

## 2020-10-07 DIAGNOSIS — R1013 Epigastric pain: Secondary | ICD-10-CM | POA: Diagnosis not present

## 2020-10-07 LAB — SPIROMETRY WITH GRAPH
FEF 25-75 Pre: 0.5 L/sec
FEF2575-%Pred-Pre: 15 %
FEV1-%Pred-Pre: 59 %
FEV1-Pre: 1.54 L
FEV1FVC-%Pred-Pre: 26 %
FEV6-%Pred-Pre: 131 %
FEV6-Pre: 3.92 L
FEV6FVC-%Pred-Pre: 59 %
FVC-%Pred-Pre: 222 %
FVC-Pre: 6.69 L
Pre FEV1/FVC ratio: 23 %
Pre FEV6/FVC Ratio: 59 %

## 2020-10-07 LAB — BASIC METABOLIC PANEL
Anion gap: 9 (ref 5–15)
BUN: 16 mg/dL (ref 6–20)
CO2: 39 mmol/L — ABNORMAL HIGH (ref 22–32)
Calcium: 9.3 mg/dL (ref 8.9–10.3)
Chloride: 87 mmol/L — ABNORMAL LOW (ref 98–111)
Creatinine, Ser: 1 mg/dL (ref 0.44–1.00)
GFR, Estimated: >60 mL/min (ref >60)
Glucose, Bld: 140 mg/dL — ABNORMAL HIGH (ref 70–99)
Potassium: 3.8 mmol/L (ref 3.5–5.1)
Sodium: 135 mmol/L (ref 135–145)

## 2020-10-07 LAB — CBC WITH DIFFERENTIAL/PLATELET
Abs Immature Granulocytes: 0.05 10*3/uL (ref 0.00–0.07)
Basophils Absolute: 0 10*3/uL (ref 0.0–0.1)
Basophils Relative: 0 %
Eosinophils Absolute: 0.2 10*3/uL (ref 0.0–0.5)
Eosinophils Relative: 3 %
HCT: 36.7 % (ref 36.0–46.0)
Hemoglobin: 10.7 g/dL — ABNORMAL LOW (ref 12.0–15.0)
Immature Granulocytes: 1 %
Lymphocytes Relative: 21 %
Lymphs Abs: 2 10*3/uL (ref 0.7–4.0)
MCH: 26.2 pg (ref 26.0–34.0)
MCHC: 29.2 g/dL — ABNORMAL LOW (ref 30.0–36.0)
MCV: 89.7 fL (ref 80.0–100.0)
Monocytes Absolute: 1 10*3/uL (ref 0.1–1.0)
Monocytes Relative: 11 %
Neutro Abs: 6 10*3/uL (ref 1.7–7.7)
Neutrophils Relative %: 64 %
Platelets: 401 10*3/uL — ABNORMAL HIGH (ref 150–400)
RBC: 4.09 MIL/uL (ref 3.87–5.11)
RDW: 15 % (ref 11.5–15.5)
WBC: 9.3 10*3/uL (ref 4.0–10.5)
nRBC: 0 % (ref 0.0–0.2)

## 2020-10-07 LAB — MAGNESIUM: Magnesium: 1.9 mg/dL (ref 1.7–2.4)

## 2020-10-07 LAB — GLUCOSE, CAPILLARY
Glucose-Capillary: 105 mg/dL — ABNORMAL HIGH (ref 70–99)
Glucose-Capillary: 188 mg/dL — ABNORMAL HIGH (ref 70–99)
Glucose-Capillary: 179 mg/dL — ABNORMAL HIGH (ref 70–99)
Glucose-Capillary: 179 mg/dL — ABNORMAL HIGH (ref 70–99)

## 2020-10-07 MED ORDER — TORSEMIDE 20 MG PO TABS
40.0000 mg | ORAL_TABLET | Freq: Two times a day (BID) | ORAL | Status: AC
  Administered 2020-10-07 (×2): 40 mg via ORAL
  Filled 2020-10-07 (×2): qty 2

## 2020-10-07 NOTE — Progress Notes (Signed)
HD#9 Subjective:   No acute events overnight. During evaluation this morning, patient wearing BiPap, sleeping initially however awakens easily to light touch. She reports she continued to feel well yesterday. When asked how she feels now, she gives a thumbs up. States she feels eady for her lung function testing.  Objective:   Vital signs in last 24 hours: Vitals:   10/06/20 2005 10/06/20 2101 10/06/20 2109 10/07/20 0503  BP: (!) 104/56   102/70  Pulse: 97  91 93  Resp: 20  (!) 24 19  Temp: 98.2 F (36.8 C)   98.2 F (36.8 C)  TempSrc: Oral   Axillary  SpO2: 98% 100% 100% 100%  Weight:      Height:       Physical Exam Constitutional: morbidly obese, lying in bed, on BiPap, sleeping initially however awakes easily to light touch Cardiac: regular rate and rhythm Pulm: normal work of breathing on BiPap, patient speaks in full sentences, diminished lung sounds throughout Abdomen: soft, non-tender to palpation  Filed Weights   10/04/20 1800 10/05/20 0506 10/06/20 1135  Weight: (!) 178.3 kg (!) 171.9 kg (!) 171.6 kg    Intake/Output Summary (Last 24 hours) at 10/07/2020 9480 Last data filed at 10/06/2020 1300 Gross per 24 hour  Intake --  Output 1251 ml  Net -1251 ml   Net IO Since Admission: -10,796.88 mL [10/07/20 0621]  Imaging: no new imaging  Assessment/Plan:   Principal Problem:   Heart failure with preserved ejection fraction (HCC) Active Problems:   Morbid obesity (HCC)   OSA (obstructive sleep apnea)   Abdominal pain   HTN (hypertension)   Proteinuria   Type 2 diabetes mellitus without complication, without long-term current use of insulin (HCC)   Gastroesophageal reflux disease without esophagitis   Asthma exacerbation   Obesity hypoventilation syndrome (HCC)   Acute respiratory failure with hypoxia (HCC)   Normocytic anemia   Mild concentric left ventricular hypertrophy (LVH)   NAFLD (nonalcoholic fatty liver disease)   Patient  Summary:  Yvette Gibbs is a 30 year old woman with a history asthma, depression, HTN, recent COVID infection, morbid obesity, and diabetes who presented with shortness of breath and abdominal pain and admitted for acute hypoxic respiratory failure which is suspected to be secondary to possible diastolic heart failure from untreated OSA/OHS  This is hospital day 9.   Acute hypoxic respiratory failure 2/2 diastolic heart failure Suspected underlying OHS/OSA Patient just waking up, wearing BiPap this morning upon evaluation. She states she is feeling well. Plan for repeat PFT today so that she can hopefully qualify for nightly BiPap. Will repeat ambulation with pulse ox prior to discharge as well. Magnesium within normal limits this morning following repletion yesterday. Serum creatinine has gradually increased this admission but still within normal limits at 1.00 this morning.  - torsemide 40 mg BID  - ABG pCO2 criteria met for home BiPap; need PFTs to assess patient's eligibility for home BiPap, scheduled for today - continue BiPap PRN - patient qualifies for home oxygen as above, order placed - Order for [facility] PT/OT and bariatric rollator placed  Uncontrolled HTN, improving Elevated initially likely in the setting of untreated OSA/OHS, hypervolemia. - Diuresis as above - continue irbasartan 167m daily, amlodipine 10 mg daily - consider transition to olmesartan-amlodipine at discharge (on medicaid formulary) if patient is willing to start  Uncontrolled type 2 diabetes mellitus, improving Patient's RN has been providing daily education about insulin administration and monitoring blood sugar at home.  -  Lantus 55 u BID - Novolog 6 u WC - SSI resistant - metformin 500 mg daily day 7, will increase to BID dosing tomorrow - consider initiation of Victoza prior to discharge vs initiation with PCP   Abdominal pain Stable. Continue protonix 40 mg daily, metoclopramide 5 mg TID, sucralfate 1g  TID, avoid NSAIDS.  Normocytic anemia. Stable. No signs of blood loss at this time. Iron studies suggest a component of iron deficiency as well. Consider starting iron supplementation however suspect that her GI symptoms will not tolerate this. - AM CBC  Diet: Carb-Modified IVF: None VTE: Enoxaparin Code: Full  Alexandria Lodge, MD Internal Medicine Resident, PGY-1 [facility] Pager: [phone] 6:21 AM, 10/07/2020

## 2020-10-07 NOTE — Progress Notes (Signed)
Physical Therapy Treatment Patient Details Name: Yvette Gibbs MRN: [id number] DOB: 10-30-1990 Today's Date: 10/07/2020    History of Present Illness 30 year female  Presents to ED 09/26/20 with shortness of breath and abdominal pain. Admitted for treatment of Acute hypoxic respiratory failure PMH: history of asthma, depression, HTN, recent COVID infection, obesity, and diabetes    PT Comments    Today's skilled session continued to focus on mobility. Pt wanting to try gait with no oxygen with decreased in SaO2 noted. Quick recovery noted when O2 reapplied. Acute PT to continue during pt's acute stay.    Follow Up Recommendations  Home health PT;Supervision for mobility/OOB     Equipment Recommendations  Other (comment) (pt has needed DME in room)    Recommendations for Other Services OT consult     Precautions / Restrictions Precautions Precautions: Other (comment) Precaution Comments: watch SpO2 Restrictions Weight Bearing Restrictions: No    Mobility  Bed Mobility               General bed mobility comments: Pt up in chair    Transfers Overall transfer level: Modified independent Equipment used: None Transfers: Sit to/from Stand Sit to Stand: Modified independent (Device/Increase time)            Ambulation/Gait Ambulation/Gait assistance: Supervision Gait Distance (Feet): 30 Feet Assistive device: None Gait Pattern/deviations: Step-through pattern;Decreased step length - right;Decreased step length - left;Wide base of support Gait velocity: decr   General Gait Details: No loss of balance noted with no AD used on RA. SaO2 decreased to 83-84% with gait on RA. O2 [place] reapplied with increased in SaO2 to >/=90% within 30 seconds, then >/= 94% with in one minute.       Cognition Arousal/Alertness: Awake/alert Behavior During Therapy: WFL for tasks assessed/performed Overall Cognitive Status: Within Functional Limits for tasks assessed                 Pertinent Vitals/Pain Pain Assessment: No/denies pain     PT Goals (current goals can now be found in the care plan section) Acute Rehab PT Goals Patient Stated Goal: go home to children PT Goal Formulation: With patient Time For Goal Achievement: 10/14/20 Potential to Achieve Goals: Fair Progress towards PT goals: Progressing toward goals    Frequency    Min 3X/week      PT Plan Current plan remains appropriate    AM-PAC PT "6 Clicks" Mobility   Outcome Measure  Help needed turning from your back to your side while in a flat bed without using bedrails?: None Help needed moving from lying on your back to sitting on the side of a flat bed without using bedrails?: None Help needed moving to and from a bed to a chair (including a wheelchair)?: None Help needed standing up from a chair using your arms (e.g., wheelchair or bedside chair)?: None Help needed to walk in hospital room?: None Help needed climbing 3-5 steps with a railing? : A Little 6 Click Score: 23    End of Session Equipment Utilized During Treatment: Oxygen Activity Tolerance: Patient tolerated treatment well Patient left: with call bell/phone within reach;in chair Nurse Communication: Mobility status PT Visit Diagnosis: Other abnormalities of gait and mobility (R26.89);Muscle weakness (generalized) (M62.81);Difficulty in walking, not elsewhere classified (R26.2)     Time: 4315-4008 PT Time Calculation (min) (ACUTE ONLY): 11 min  Charges:  $Gait Training: 8-22 mins  Sallyanne Kuster, PTA, CLT Acute Rehab Services [phone] 10/07/20, 10:25 AM  Sallyanne Kuster 10/07/2020, 10:24 AM

## 2020-10-08 DIAGNOSIS — F32A Depression, unspecified: Secondary | ICD-10-CM | POA: Diagnosis not present

## 2020-10-08 DIAGNOSIS — R1030 Lower abdominal pain, unspecified: Secondary | ICD-10-CM | POA: Diagnosis not present

## 2020-10-08 DIAGNOSIS — J45901 Unspecified asthma with (acute) exacerbation: Secondary | ICD-10-CM | POA: Diagnosis not present

## 2020-10-08 DIAGNOSIS — I5033 Acute on chronic diastolic (congestive) heart failure: Secondary | ICD-10-CM | POA: Diagnosis not present

## 2020-10-08 DIAGNOSIS — R1013 Epigastric pain: Secondary | ICD-10-CM | POA: Diagnosis not present

## 2020-10-08 DIAGNOSIS — D649 Anemia, unspecified: Secondary | ICD-10-CM | POA: Diagnosis not present

## 2020-10-08 DIAGNOSIS — G4733 Obstructive sleep apnea (adult) (pediatric): Secondary | ICD-10-CM | POA: Diagnosis not present

## 2020-10-08 DIAGNOSIS — I110 Hypertensive heart disease with heart failure: Secondary | ICD-10-CM | POA: Diagnosis not present

## 2020-10-08 DIAGNOSIS — K760 Fatty (change of) liver, not elsewhere classified: Secondary | ICD-10-CM | POA: Diagnosis not present

## 2020-10-08 DIAGNOSIS — J9601 Acute respiratory failure with hypoxia: Secondary | ICD-10-CM | POA: Diagnosis not present

## 2020-10-08 DIAGNOSIS — Z8616 Personal history of COVID-19: Secondary | ICD-10-CM | POA: Diagnosis not present

## 2020-10-08 LAB — CBC WITH DIFFERENTIAL/PLATELET
Abs Immature Granulocytes: 0.04 10*3/uL (ref 0.00–0.07)
Basophils Absolute: 0 10*3/uL (ref 0.0–0.1)
Basophils Relative: 0 %
Eosinophils Absolute: 0.2 10*3/uL (ref 0.0–0.5)
Eosinophils Relative: 2 %
HCT: 34.6 % — ABNORMAL LOW (ref 36.0–46.0)
Hemoglobin: 10 g/dL — ABNORMAL LOW (ref 12.0–15.0)
Immature Granulocytes: 0 %
Lymphocytes Relative: 22 %
Lymphs Abs: 2.1 10*3/uL (ref 0.7–4.0)
MCH: 25.6 pg — ABNORMAL LOW (ref 26.0–34.0)
MCHC: 28.9 g/dL — ABNORMAL LOW (ref 30.0–36.0)
MCV: 88.7 fL (ref 80.0–100.0)
Monocytes Absolute: 1.3 10*3/uL — ABNORMAL HIGH (ref 0.1–1.0)
Monocytes Relative: 14 %
Neutro Abs: 5.7 10*3/uL (ref 1.7–7.7)
Neutrophils Relative %: 62 %
Platelets: 430 10*3/uL — ABNORMAL HIGH (ref 150–400)
RBC: 3.9 MIL/uL (ref 3.87–5.11)
RDW: 15.1 % (ref 11.5–15.5)
WBC: 9.3 10*3/uL (ref 4.0–10.5)
nRBC: 0 % (ref 0.0–0.2)

## 2020-10-08 LAB — MAGNESIUM: Magnesium: 1.7 mg/dL (ref 1.7–2.4)

## 2020-10-08 LAB — GLUCOSE, CAPILLARY
Glucose-Capillary: 139 mg/dL — ABNORMAL HIGH (ref 70–99)
Glucose-Capillary: 184 mg/dL — ABNORMAL HIGH (ref 70–99)

## 2020-10-08 LAB — BASIC METABOLIC PANEL
Anion gap: 10 (ref 5–15)
BUN: 26 mg/dL — ABNORMAL HIGH (ref 6–20)
CO2: 36 mmol/L — ABNORMAL HIGH (ref 22–32)
Calcium: 9 mg/dL (ref 8.9–10.3)
Chloride: 87 mmol/L — ABNORMAL LOW (ref 98–111)
Creatinine, Ser: 1.18 mg/dL — ABNORMAL HIGH (ref 0.44–1.00)
GFR, Estimated: >60 mL/min (ref >60)
Glucose, Bld: 140 mg/dL — ABNORMAL HIGH (ref 70–99)
Potassium: 4 mmol/L (ref 3.5–5.1)
Sodium: 133 mmol/L — ABNORMAL LOW (ref 135–145)

## 2020-10-08 MED ORDER — AMLODIPINE-OLMESARTAN 10-40 MG PO TABS: 1.0000 | ORAL_TABLET | Freq: Every day | ORAL | 0 refills | Status: DC

## 2020-10-08 MED ORDER — ENOXAPARIN SODIUM 100 MG/ML ~~LOC~~ SOLN: 85.0000 mg | Freq: Every day | SUBCUTANEOUS | Status: DC

## 2020-10-08 MED ORDER — METFORMIN HCL 500 MG PO TABS
500.0000 mg | ORAL_TABLET | Freq: Two times a day (BID) | ORAL | 0 refills | Status: DC
Start: 2020-10-08 — End: 2020-10-24

## 2020-10-08 MED ORDER — PANTOPRAZOLE SODIUM 40 MG PO TBEC: 40.0000 mg | DELAYED_RELEASE_TABLET | Freq: Every day | ORAL | 0 refills | Status: DC

## 2020-10-08 MED ORDER — ACCU-CHEK GUIDE ME W/DEVICE KIT: PACK | 0 refills | Status: DC

## 2020-10-08 MED ORDER — ACCU-CHEK SOFTCLIX LANCETS MISC: 12 refills | Status: DC

## 2020-10-08 MED ORDER — TORSEMIDE 20 MG PO TABS
40.0000 mg | ORAL_TABLET | Freq: Two times a day (BID) | ORAL | Status: DC
  Administered 2020-10-08: 40 mg via ORAL
  Filled 2020-10-08: qty 2

## 2020-10-08 MED ORDER — ACCU-CHEK GUIDE VI STRP: ORAL_STRIP | 3 refills | Status: DC

## 2020-10-08 MED ORDER — SUCRALFATE 1 GM/10ML PO SUSP: 1.0000 g | Freq: Three times a day (TID) | ORAL | 0 refills | Status: DC

## 2020-10-08 MED ORDER — INSULIN GLARGINE 100 UNIT/ML ~~LOC~~ SOLN: 55.0000 [IU] | Freq: Two times a day (BID) | SUBCUTANEOUS | 11 refills | Status: DC

## 2020-10-08 MED ORDER — METOCLOPRAMIDE HCL 5 MG PO TABS: 5.0000 mg | ORAL_TABLET | Freq: Three times a day (TID) | ORAL | 0 refills | Status: DC

## 2020-10-08 MED ORDER — TORSEMIDE 40 MG PO TABS: 40.0000 mg | ORAL_TABLET | Freq: Two times a day (BID) | ORAL | 0 refills | Status: DC

## 2020-10-08 MED ORDER — CYCLOBENZAPRINE HCL 5 MG PO TABS: 5.0000 mg | ORAL_TABLET | Freq: Once | ORAL | 0 refills | Status: DC | PRN

## 2020-10-08 MED ORDER — VICTOZA 18 MG/3ML ~~LOC~~ SOPN: PEN_INJECTOR | SUBCUTANEOUS | 0 refills | Status: DC

## 2020-10-08 MED ORDER — FLUTICASONE PROPIONATE 50 MCG/ACT NA SUSP: 1.0000 | Freq: Every day | NASAL | 0 refills | Status: DC

## 2020-10-08 MED ORDER — ACCU-CHEK SOFTCLIX LANCET DEV KIT: PACK | 0 refills | Status: DC

## 2020-10-08 NOTE — Progress Notes (Signed)
Occupational Therapy Treatment Patient Details Name: Yvette Gibbs MRN: [id number] DOB: 11-Aug-1990 Today's Date: 10/08/2020    History of present illness Pt is 30 year female  Presents to ED 09/26/20 with shortness of breath and abdominal pain. Admitted for treatment of Acute hypoxic respiratory failure PMH: history of asthma, depression, HTN, recent COVID infection, obesity, and diabetes   OT comments  Pt making steady progress towards OT goals this session. Pt received seated in recliner reporting that she is going home today. Pt eager to DC but agreeable to OT intervention. Provided pt with BUE HEP with level 1 theraband to increase BUE strength and overall functional endurance. Pt currently requires MOD A for LB ADLS and MOD I for sit<>stand from recliner. Education provided on appropriate O2 readings at home and where to obtain pulse ox. Reviewed energy conservation strategies for home with pt verbalizing understanding.  Pt would continue to benefit from skilled occupational therapy while admitted and after d/c to address the below listed limitations in order to improve overall functional mobility and facilitate independence with BADL participation. DC plan remains appropriate, will follow acutely per POC.      Follow Up Recommendations  Home health OT;Supervision - Intermittent    Equipment Recommendations  Other (comment) (pt reports having access to shower chair; may need supplemental O2 at home)    Recommendations for Other Services      Precautions / Restrictions Precautions Precautions: Other (comment) Precaution Comments: watch SpO2 Restrictions Weight Bearing Restrictions: No       Mobility Bed Mobility               General bed mobility comments: Pt up in chair    Transfers Overall transfer level: Modified independent Equipment used: None Transfers: Sit to/from Stand Sit to Stand: Modified independent (Device/Increase time)         General transfer  comment: Incr time to rise    Balance Overall balance assessment: Needs assistance Sitting-balance support: Feet unsupported;Feet supported;No upper extremity supported Sitting balance-Leahy Scale: Normal Sitting balance - Comments: sitting on edgr of recliner to don pants with no UE support or LOB noted   Standing balance support: During functional activity;No upper extremity supported Standing balance-Leahy Scale: Good Standing balance comment: sit<>stand to don pants with no UE support or LOB                           ADL either performed or assessed with clinical judgement   ADL Overall ADL's : Needs assistance/impaired             Lower Body Bathing: Moderate assistance;Sit to/from stand Lower Body Bathing Details (indicate cue type and reason): simulated via LB dressing with pt needing assist threading BLEs into pants from recliner     Lower Body Dressing: Moderate assistance;Sit to/from stand Lower Body Dressing Details (indicate cue type and reason): assist needed to thread BLEs into pants Toilet Transfer: Modified Independent Toilet Transfer Details (indicate cue type and reason): sit<>stand only       Tub/Shower Transfer Details (indicate cue type and reason): pt reports shower seat at home Functional mobility during ADLs: Supervision/safety (sit<>stand only from recliner) General ADL Comments: session focus on seated BUE therex with level 1 theraband, LB dressing, and education on appropriate pulse ox readings so pt can monitor at home     Vision       Perception     Praxis      Cognition Arousal/Alertness:  Awake/alert Behavior During Therapy: WFL for tasks assessed/performed Overall Cognitive Status: Within Functional Limits for tasks assessed                                          Exercises General Exercises - Upper Extremity Shoulder Flexion: Both;5 reps;Seated;Theraband;Strengthening Theraband Level (Shoulder  Flexion): Level 1 (Yellow) Shoulder Extension: Both;5 reps;Seated;Theraband;Strengthening Theraband Level (Shoulder Extension): Level 1 (Yellow) Shoulder Horizontal ABduction: Strengthening;Both;5 reps;Seated;Theraband Theraband Level (Shoulder Horizontal Abduction): Level 1 (Yellow) Shoulder Horizontal ADduction: Strengthening;Both;5 reps;Seated;Theraband Theraband Level (Shoulder Horizontal Adduction): Level 1 (Yellow) Elbow Flexion: Strengthening;5 reps;Both;Seated;Theraband Theraband Level (Elbow Flexion): Level 1 (Yellow) Elbow Extension: Strengthening;Both;5 reps;Seated;Theraband Theraband Level (Elbow Extension): Level 1 (Yellow) Other Exercises Other Exercises: punches from recliner with level 1 theraband x10 reps   Shoulder Instructions       General Comments pt on 2L during session with SpO2 97% during session, education provided on appropriate O2 reading and where to obtain pulse ox for home    Pertinent Vitals/ Pain       Pain Assessment: No/denies pain  Home Living                                          Prior Functioning/Environment              Frequency  Min 2X/week        Progress Toward Goals  OT Goals(current goals can now be found in the care plan section)  Progress towards OT goals: Progressing toward goals  Acute Rehab OT Goals Patient Stated Goal: to go home today OT Goal Formulation: With patient Time For Goal Achievement: 10/15/20 Potential to Achieve Goals: Good  Plan Discharge plan remains appropriate;Frequency remains appropriate    Co-evaluation                 AM-PAC OT "6 Clicks" Daily Activity     Outcome Measure   Help from another person eating meals?: None Help from another person taking care of personal grooming?: A Little Help from another person toileting, which includes using toliet, bedpan, or urinal?: A Little Help from another person bathing (including washing, rinsing, drying)?: A  Little Help from another person to put on and taking off regular upper body clothing?: None Help from another person to put on and taking off regular lower body clothing?: A Little 6 Click Score: 20    End of Session Equipment Utilized During Treatment: Oxygen;Other (comment) (2L [place]; level 1 theraband)  OT Visit Diagnosis: Muscle weakness (generalized) (M62.81);Other (comment) (decreased cardiopulmonary tolerance)   Activity Tolerance Patient tolerated treatment well   Patient Left in chair;with call bell/phone within reach   Nurse Communication Mobility status        Time: 6644-0347 OT Time Calculation (min): 24 min  Charges: OT General Charges $OT Visit: 1 Visit OT Treatments $Self Care/Home Management : 8-22 mins $Therapeutic Exercise: 8-22 mins  Lenor Derrick., COTA/L Acute Rehabilitation Services [phone]    Barron Schmid 10/08/2020, 2:45 PM

## 2020-10-08 NOTE — Progress Notes (Signed)
Patient placed on overnight pulse ox to monitor oxygen saturations while she sleeps. Patient not placed on BIPAP tonight since MD wants this study preformed on 2L nasal cannula.

## 2020-10-08 NOTE — Discharge Instructions (Addendum)
Ms. Gasner,   It was a pleasure taking care of you in the hospital. 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 These problems put strain on your heart, leading to back up of fluid into your lungs and body. We treated you with medicine to help you pee, and you will continue on this fluid pill (torsemide) going forward. You qualified for home oxygen. This is just the beginning of your process to help with your breathing. In the near future you will need additional lung testing as well as possible sleep study.   We also worked on your diabetes regimen. You will continue taking Lantus 55 units twice daily and metformin 500 mg twice daily as started here in the hospital. I have sent a prescription for Victoza to your pharmacy. This is another diabetes medication that will help with your blood sugar as well as weight loss. Please check your blood sugar before each meal and before bed, record the level, and bring to your hospital follow-up appointment.  Amlodipine-olmesartan is a combination pill for your high blood pressure.  We would like for you to follow-up in the Internal Medicine Clinic in the next week. You can decide at that time if you would like to establish primary care with [place] or if you would like to establish care with [name] at [facility].  Take care!

## 2020-10-08 NOTE — Progress Notes (Signed)
Physical Therapy Treatment Patient Details Name: Yvette Gibbs MRN: [id number] DOB: 07-04-1990 Today's Date: 10/08/2020    History of Present Illness Pt is 30 year female  Presents to ED 09/26/20 with shortness of breath and abdominal pain. Admitted for treatment of Acute hypoxic respiratory failure PMH: history of asthma, depression, HTN, recent COVID infection, obesity, and diabetes    PT Comments    Pt making gradual progress. She was able to increase gait to 26' on 2 L of O2 with sats 90% or greater.  Pt steady with gait and with good safety awareness with use of DME.  Continue to advance cardiopulmonary endurance as able.     Follow Up Recommendations  Home health PT;Supervision for mobility/OOB     Equipment Recommendations   (Has needed DME in room (rollator and O2))    Recommendations for Other Services       Precautions / Restrictions Precautions Precautions: Other (comment) Precaution Comments: watch SpO2    Mobility  Bed Mobility               General bed mobility comments: Pt up in chair    Transfers Overall transfer level: Modified independent Equipment used: None Transfers: Sit to/from Stand Sit to Stand: Modified independent (Device/Increase time)         General transfer comment: Incr time to rise  Ambulation/Gait Ambulation/Gait assistance: Supervision Gait Distance (Feet): 80 Feet Assistive device: None Gait Pattern/deviations: Step-through pattern;Wide base of support;Decreased stride length Gait velocity: decreased   General Gait Details: Steady gait, no LOB.  Ambulated on 2 L O2 and was able to increase distance.  O2 sats 90% with activity (98% rest) with DOE of 2/4.  Discussed rest breaks as needed, pt reports getting rollator - educated on rollator functions/safety, use of O2 allows increased activity safely   Stairs             Wheelchair Mobility    Modified Rankin (Stroke Patients Only)       Balance Overall  balance assessment: Needs assistance Sitting-balance support: No upper extremity supported;Feet unsupported Sitting balance-Leahy Scale: Normal     Standing balance support: During functional activity;No upper extremity supported Standing balance-Leahy Scale: Good Standing balance comment: No loss of balance with gait                            Cognition Arousal/Alertness: Awake/alert Behavior During Therapy: WFL for tasks assessed/performed Overall Cognitive Status: Within Functional Limits for tasks assessed                                        Exercises      General Comments        Pertinent Vitals/Pain Pain Assessment: No/denies pain    Home Living                      Prior Function            PT Goals (current goals can now be found in the care plan section) Acute Rehab PT Goals Patient Stated Goal: go home to children PT Goal Formulation: With patient Time For Goal Achievement: 10/14/20 Potential to Achieve Goals: Fair Progress towards PT goals: Progressing toward goals    Frequency    Min 3X/week      PT Plan Current plan remains appropriate  Co-evaluation              AM-PAC PT "6 Clicks" Mobility   Outcome Measure  Help needed turning from your back to your side while in a flat bed without using bedrails?: None Help needed moving from lying on your back to sitting on the side of a flat bed without using bedrails?: None Help needed moving to and from a bed to a chair (including a wheelchair)?: None Help needed standing up from a chair using your arms (e.g., wheelchair or bedside chair)?: None Help needed to walk in hospital room?: A Little Help needed climbing 3-5 steps with a railing? : A Little 6 Click Score: 22    End of Session Equipment Utilized During Treatment: Oxygen Activity Tolerance: Patient tolerated treatment well Patient left: with call bell/phone within reach;in chair Nurse  Communication: Mobility status PT Visit Diagnosis: Other abnormalities of gait and mobility (R26.89);Muscle weakness (generalized) (M62.81);Difficulty in walking, not elsewhere classified (R26.2)     Time: 1208-1228 PT Time Calculation (min) (ACUTE ONLY): 20 min  Charges:  $Gait Training: 8-22 mins                     Anise Salvo, PT Acute Rehab Services Pager [phone] [facility] [phone]     [name] 10/08/2020, 12:44 PM

## 2020-10-08 NOTE — TOC Transition Note (Signed)
Transition of Care [facility]) - CM/SW Discharge Note   Patient Details  Name: Yvette Gibbs MRN: [id number] Date of Birth: 03-Jun-1991  [facility] CM/SW Contact:  Lawerance Sabal, RN Phone Number: 10/08/2020, 3:22 PM   Clinical Narrative:    Confirmed that patient has home oxygen and bariatric walker in room to go home with.  Dr Claudette Laws aware patient does not qualify for home BiPAP, CM confirmed w MD that she was not pursuing NIV. CM establishing HH services. [facility] accepted for [facility] services    Final next level of care: Home w Home Health Services Barriers to Discharge: No Barriers Identified   Patient Goals and CMS Choice Patient states their goals for this hospitalization and ongoing recovery are:: to go home CMS Medicare.gov Compare Post Acute Care list provided to:: Patient Choice offered to / list presented to : Patient  Discharge Placement                       Discharge Plan and Services   Discharge Planning Services: CM Consult,Follow-up appt scheduled Post Acute Care Choice: Durable Medical Equipment,Home Health          DME Arranged: Bipap,Walker rolling DME Agency: [employer] Date DME Agency Contacted: 09/30/20 Time DME Agency Contacted: 1608 Representative spoke with at DME Agency: Francesco Sor Arranged: PT,OT,RN          Social Determinants of Health (SDOH) Interventions     Readmission Risk Interventions No flowsheet data found.

## 2020-10-08 NOTE — Discharge Planning (Signed)
Nsg Discharge Note  Admit Date:  09/28/2020 Discharge date: 10/08/2020   Yvette Gibbs to be D/C'd Home per MD order.  AVS completed.    Discharge Medication: Allergies as of 10/08/2020      Reactions   Lisinopril    Caused a rash and caused her to sweat a lot.   Fish Allergy Itching, Rash      Medication List    STOP taking these medications   amLODipine 5 MG tablet Commonly known as: NORVASC   ibuprofen 600 MG tablet Commonly known as: ADVIL   omeprazole 20 MG capsule Commonly known as: PRILOSEC   sucralfate 1 g tablet Commonly known as: Carafate Replaced by: sucralfate 1 GM/10ML suspension     TAKE these medications   Accu-Chek Guide Me w/Device Kit Use as directed to check blood sugar once daily.   Accu-Chek Guide test strip Generic drug: glucose blood Use as directed to check blood sugar once daily.   Accu-Chek Softclix Lancet Dev Kit Use as directed with lancets   Accu-Chek Softclix Lancets lancets Use as instructed   acetaminophen 500 MG tablet Commonly known as: TYLENOL Take 1,000 mg by mouth every 6 (six) hours as needed for mild pain or fever.   albuterol 108 (90 Base) MCG/ACT inhaler Commonly known as: VENTOLIN HFA Inhale 1-2 puffs into the lungs every 6 (six) hours as needed for wheezing or shortness of breath.   amLODipine-olmesartan 10-40 MG tablet Commonly known as: AZOR Take 1 tablet by mouth daily.   cyclobenzaprine 5 MG tablet Commonly known as: FLEXERIL Take 1 tablet (5 mg total) by mouth once as needed for muscle spasms.   fluticasone 50 MCG/ACT nasal spray Commonly known as: FLONASE Place 1 spray into both nostrils daily. Start taking on: October 09, 2020   guaiFENesin-dextromethorphan 100-10 MG/5ML syrup Commonly known as: ROBITUSSIN DM Take 5 mLs by mouth every 4 (four) hours as needed for cough.   insulin glargine 100 UNIT/ML injection Commonly known as: LANTUS Inject 0.55 mLs (55 Units total) into the skin 2 (two) times  daily.   loratadine 10 MG tablet Commonly known as: CLARITIN Take 10 mg by mouth daily as needed for allergies.   metFORMIN 500 MG tablet Commonly known as: GLUCOPHAGE Take 1 tablet (500 mg total) by mouth 2 (two) times daily with a meal. What changed: Another medication with the same name was removed. Continue taking this medication, and follow the directions you see here.   metoCLOPramide 5 MG tablet Commonly known as: REGLAN Take 1 tablet (5 mg total) by mouth 3 (three) times daily before meals.   ondansetron 4 MG disintegrating tablet Commonly known as: Zofran ODT Take 1 tablet (4 mg total) by mouth every 8 (eight) hours as needed for nausea or vomiting.   pantoprazole 40 MG tablet Commonly known as: PROTONIX Take 1 tablet (40 mg total) by mouth daily.   sucralfate 1 GM/10ML suspension Commonly known as: CARAFATE Take 10 mLs (1 g total) by mouth 4 (four) times daily -  with meals and at bedtime. Replaces: sucralfate 1 g tablet   Torsemide 40 MG Tabs Take 40 mg by mouth 2 (two) times daily.   Victoza 18 MG/3ML Sopn Generic drug: liraglutide Inject 0.6 mg into the skin daily for 7 days, THEN 1.2 mg daily for 21 days. Start taking on: October 08, 2020            Durable Medical Equipment  (From admission, onward)  Start     Ordered   10/03/20 1600  For home use only DME oxygen  Once       Question Answer Comment  Length of Need 6 Months   Mode or (Route) Nasal cannula   Liters per Minute 2   Frequency Continuous (stationary and portable oxygen unit needed)   Oxygen conserving device Yes   Oxygen delivery system Gas      10/03/20 1600   10/03/20 1127  For home use only DME Bipap  Once       Question:  Length of Need  Answer:  Lifetime   10/03/20 1127   09/30/20 1612  For home use only DME Other see comment  Once       Comments: Needs Bariatric rollator. Weighs 418 lbs  Question:  Length of Need  Answer:  12 Months   09/30/20 1612           Discharge Assessment: Vitals:   10/08/20 0339 10/08/20 0744  BP: 112/69   Pulse: 92   Resp: 17   Temp: 98.8 F (37.1 C)   SpO2: 93% 97%   Skin clean, dry and intact without evidence of skin break down, no evidence of skin tears noted. IV catheter discontinued intact. Site without signs and symptoms of complications - no redness or edema noted at insertion site, patient denies c/o pain - only slight tenderness at site.  Dressing with slight pressure applied.  D/c Instructions-Education: Discharge instructions given to patient/family with verbalized understanding. D/c education completed with patient/family including follow up instructions, medication list, d/c activities limitations if indicated, with other d/c instructions as indicated by MD - patient able to verbalize understanding, all questions fully answered. Patient instructed to return to ED, call 911, or call MD for any changes in condition.  Patient escorted via [place], and D/C home via private auto. Patient's home oxygen tank and walker was sent with patient.   Hiram Comber, RN 10/08/2020 3:49 PM

## 2020-10-10 ENCOUNTER — Telehealth: Payer: Self-pay

## 2020-10-10 NOTE — Telephone Encounter (Signed)
-----   Message from Alphonzo Severance, MD sent at 10/08/2020  1:23 PM EDT ----- Regarding: hospital follow-up Hi there,   Could you schedule a one-time hospital follow-up appointment for this patient in the next week? Date of discharge: 10/08/20.  This is a one-time visit, however the patient may decide to establish care at [facility].  Thank you!  Sincerely,  Alphonzo Severance, MD

## 2020-10-10 NOTE — Telephone Encounter (Addendum)
1st attempt :PHONE CALL NOT COMPLETED.  PT HAD TO GO AND TAKE CARE OF SOMETHING.  CMA OFFERED TO CALL PT BACK LATER.  PT DID ASK THAT CMA CHANGE CURRENT HFU APPT TO AN AFTERNOON. WILL GIVE NEW TIME TO PATIENT ON CALL BACK.    Transition Care Management Follow-up Telephone Call  Date of discharge and from where:   How have you been since you were released from the hospital? "Been doing pretty good"  Any questions or concerns? Yes (have yet to obtain scale-out of pocket expense  requests pen needles and syringes-CMA phoned into pharmacy )  Items Reviewed:  Did the pt receive and understand the discharge instructions provided? Yes   Medications obtained and verified?  Did not complete list of meds-pt had to end call- but spoke with [facility]-she completed med rec in home on 04/28  Other? No   Any new allergies since your discharge? No   Dietary orders reviewed? Yes  Do you have support at home? Very little  Home Care and Equipment/Supplies: Were home health services ordered? yes If so, what is the name of the agency?[facility] Has the agency set up a time to come to the patient's home? Yes-seen by [facility] today Were any new equipment or medical supplies ordered?  No-received walker at d/c from hospital What is the name of the medical supply agency? N/A Were you able to get the supplies/equipment? No-pt states she was started on injectable medication for her diabetes, but she needs rx for pen needles in addition to rx for syringes CMA phoned in pen needles.  Do you have any questions related to the use of the equipment or supplies? No  Functional Questionnaire: (I = Independent and D = Dependent) ADLs: I  Bathing/Dressing- I  Meal Prep- I  Eating- I  Maintaining continence- I  Transferring/Ambulation- I  Managing Meds- I  Follow up appointments reviewed:   PCP Hospital f/u appt confirmed? Yes  Scheduled to see DrNguyen on 10/17/20 @  1345  Specialist Hospital f/u appt confirmed? No    Are transportation arrangements needed? Yes  (pt will work on arrangements)  If their condition worsens, is the pt aware to call PCP or go to the Emergency Dept.? Yes  Was the patient provided with contact information for the PCP's office or ED?  yes  Was to pt encouraged to call back with questions or concerns? Yes  Pt wishes to transfer care to [facility] and will discuss during visit

## 2020-10-10 NOTE — Telephone Encounter (Signed)
TOC HFU appointment 10/18/2020 at 9:45 am with Dr. Cyndie Chime.  Unable to get in contact with patient.  Appointment letter mailed with date and time.

## 2020-10-10 NOTE — Telephone Encounter (Signed)
[facility] nurse from [facility] is requesting a call  Back  About pt medicine she is a new pt to [place] who will be seen  5/4/ by Dr Cyndie Chime

## 2020-10-11 NOTE — Telephone Encounter (Signed)
Request for verbal order for nursing for medication and disease management (DM, CHF, COPD) starting 10/10/20-CMA gave verbal order to authorize services.   Nurse did med rec and the following medications were listed on d/c summary, but not in pt's possession -claritin, zofran, and robitussin DM States pt did not have pen needles and syringes for lantus vials and victoza pen- CMA had already phoned in.  Will have MD change lantus vial to the pen at follow up visit next week

## 2020-10-13 ENCOUNTER — Emergency Department (HOSPITAL_COMMUNITY)
Admission: EM | Admit: 2020-10-13 | Discharge: 2020-10-13 | Disposition: A | Discharge status: Home / Self Care | Payer: Medicaid Other | Attending: Emergency Medicine | Admitting: Emergency Medicine

## 2020-10-13 ENCOUNTER — Other Ambulatory Visit: Payer: Self-pay

## 2020-10-13 ENCOUNTER — Encounter (HOSPITAL_COMMUNITY): Payer: Self-pay | Admitting: Emergency Medicine

## 2020-10-13 DIAGNOSIS — R1013 Epigastric pain: Secondary | ICD-10-CM | POA: Insufficient documentation

## 2020-10-13 DIAGNOSIS — E119 Type 2 diabetes mellitus without complications: Secondary | ICD-10-CM | POA: Insufficient documentation

## 2020-10-13 DIAGNOSIS — D649 Anemia, unspecified: Secondary | ICD-10-CM | POA: Diagnosis not present

## 2020-10-13 DIAGNOSIS — R0789 Other chest pain: Secondary | ICD-10-CM | POA: Diagnosis not present

## 2020-10-13 DIAGNOSIS — Z8616 Personal history of COVID-19: Secondary | ICD-10-CM | POA: Diagnosis not present

## 2020-10-13 DIAGNOSIS — I499 Cardiac arrhythmia, unspecified: Secondary | ICD-10-CM | POA: Diagnosis not present

## 2020-10-13 DIAGNOSIS — I10 Essential (primary) hypertension: Secondary | ICD-10-CM | POA: Insufficient documentation

## 2020-10-13 DIAGNOSIS — Z79899 Other long term (current) drug therapy: Secondary | ICD-10-CM | POA: Diagnosis not present

## 2020-10-13 DIAGNOSIS — Z7984 Long term (current) use of oral hypoglycemic drugs: Secondary | ICD-10-CM | POA: Diagnosis not present

## 2020-10-13 DIAGNOSIS — J45901 Unspecified asthma with (acute) exacerbation: Secondary | ICD-10-CM | POA: Insufficient documentation

## 2020-10-13 DIAGNOSIS — R5383 Other fatigue: Secondary | ICD-10-CM | POA: Diagnosis not present

## 2020-10-13 DIAGNOSIS — Z794 Long term (current) use of insulin: Secondary | ICD-10-CM | POA: Insufficient documentation

## 2020-10-13 DIAGNOSIS — R079 Chest pain, unspecified: Secondary | ICD-10-CM | POA: Diagnosis not present

## 2020-10-13 DIAGNOSIS — R6889 Other general symptoms and signs: Secondary | ICD-10-CM | POA: Diagnosis not present

## 2020-10-13 DIAGNOSIS — Z7951 Long term (current) use of inhaled steroids: Secondary | ICD-10-CM | POA: Diagnosis not present

## 2020-10-13 DIAGNOSIS — E662 Morbid (severe) obesity with alveolar hypoventilation: Secondary | ICD-10-CM | POA: Diagnosis not present

## 2020-10-13 DIAGNOSIS — Z419 Encounter for procedure for purposes other than remedying health state, unspecified: Secondary | ICD-10-CM | POA: Diagnosis not present

## 2020-10-13 DIAGNOSIS — R112 Nausea with vomiting, unspecified: Secondary | ICD-10-CM

## 2020-10-13 DIAGNOSIS — J9601 Acute respiratory failure with hypoxia: Secondary | ICD-10-CM | POA: Diagnosis not present

## 2020-10-13 DIAGNOSIS — R519 Headache, unspecified: Secondary | ICD-10-CM | POA: Insufficient documentation

## 2020-10-13 DIAGNOSIS — Z743 Need for continuous supervision: Secondary | ICD-10-CM | POA: Diagnosis not present

## 2020-10-13 DIAGNOSIS — K219 Gastro-esophageal reflux disease without esophagitis: Secondary | ICD-10-CM | POA: Diagnosis not present

## 2020-10-13 LAB — COMPREHENSIVE METABOLIC PANEL
ALT: 36 U/L (ref 0–44)
AST: 43 U/L — ABNORMAL HIGH (ref 15–41)
Albumin: 3.7 g/dL (ref 3.5–5.0)
Alkaline Phosphatase: 66 U/L (ref 38–126)
Anion gap: 13 (ref 5–15)
BUN: 34 mg/dL — ABNORMAL HIGH (ref 6–20)
CO2: 27 mmol/L (ref 22–32)
Calcium: 9.3 mg/dL (ref 8.9–10.3)
Chloride: 96 mmol/L — ABNORMAL LOW (ref 98–111)
Creatinine, Ser: 1.54 mg/dL — ABNORMAL HIGH (ref 0.44–1.00)
GFR, Estimated: 47 mL/min — ABNORMAL LOW (ref >60)
Glucose, Bld: 196 mg/dL — ABNORMAL HIGH (ref 70–99)
Potassium: 4.8 mmol/L (ref 3.5–5.1)
Sodium: 136 mmol/L (ref 135–145)
Total Bilirubin: 0.6 mg/dL (ref 0.3–1.2)
Total Protein: 8.8 g/dL — ABNORMAL HIGH (ref 6.5–8.1)

## 2020-10-13 LAB — I-STAT BETA HCG BLOOD, ED (MC, WL, AP ONLY): I-stat hCG, quantitative: <5 m[IU]/mL (ref <5)

## 2020-10-13 LAB — CBC
HCT: 39.1 % (ref 36.0–46.0)
Hemoglobin: 11.4 g/dL — ABNORMAL LOW (ref 12.0–15.0)
MCH: 26.3 pg (ref 26.0–34.0)
MCHC: 29.2 g/dL — ABNORMAL LOW (ref 30.0–36.0)
MCV: 90.3 fL (ref 80.0–100.0)
Platelets: 461 10*3/uL — ABNORMAL HIGH (ref 150–400)
RBC: 4.33 MIL/uL (ref 3.87–5.11)
RDW: 15.6 % — ABNORMAL HIGH (ref 11.5–15.5)
WBC: 5.4 10*3/uL (ref 4.0–10.5)
nRBC: 0 % (ref 0.0–0.2)

## 2020-10-13 LAB — LIPASE, BLOOD: Lipase: 29 U/L (ref 11–51)

## 2020-10-13 MED ORDER — METOCLOPRAMIDE HCL 5 MG/ML IJ SOLN
10.0000 mg | Freq: Once | INTRAMUSCULAR | Status: AC
  Administered 2020-10-13: 10 mg via INTRAVENOUS
  Filled 2020-10-13: qty 2

## 2020-10-13 MED ORDER — FAMOTIDINE 20 MG PO TABS: 20.0000 mg | ORAL_TABLET | Freq: Two times a day (BID) | ORAL | 2 refills | Status: DC

## 2020-10-13 MED ORDER — SODIUM CHLORIDE 0.9 % IV BOLUS
500.0000 mL | Freq: Once | INTRAVENOUS | Status: AC
  Administered 2020-10-13: 500 mL via INTRAVENOUS

## 2020-10-13 MED ORDER — LIDOCAINE VISCOUS HCL 2 % MT SOLN
15.0000 mL | Freq: Once | OROMUCOSAL | Status: AC
  Administered 2020-10-13: 15 mL via ORAL
  Filled 2020-10-13: qty 15

## 2020-10-13 MED ORDER — FAMOTIDINE IN NACL 20-0.9 MG/50ML-% IV SOLN
20.0000 mg | Freq: Once | INTRAVENOUS | Status: AC
  Administered 2020-10-13: 20 mg via INTRAVENOUS
  Filled 2020-10-13: qty 50

## 2020-10-13 MED ORDER — ALUM & MAG HYDROXIDE-SIMETH 200-200-20 MG/5ML PO SUSP
30.0000 mL | Freq: Once | ORAL | Status: AC
  Administered 2020-10-13: 30 mL via ORAL
  Filled 2020-10-13: qty 30

## 2020-10-13 MED ORDER — ONDANSETRON HCL 4 MG PO TABS: 4.0000 mg | ORAL_TABLET | Freq: Three times a day (TID) | ORAL | 0 refills | Status: DC | PRN

## 2020-10-13 MED ORDER — ONDANSETRON 4 MG PO TBDP: 4.0000 mg | ORAL_TABLET | Freq: Three times a day (TID) | ORAL | 0 refills | Status: DC | PRN

## 2020-10-13 NOTE — ED Provider Notes (Signed)
[facility] DEPT Provider Note   CSN: [id number] Arrival date & time: 10/13/20  1700     History Chief Complaint  Patient presents with  . Emesis    Yvette Gibbs is a 30 y.o. female with a history of obesity, cholecystectomy, appendectomy, presented to emergency department for pain and vomiting.  The patient reports that she woke up feeling well, but abruptly began having epigastric pain and vomiting this morning.  She had 6 episodes of nonbloody, nonbilious vomiting.  She describes a sharp, stabbing epigastric pain.  The pain is constant.  She has not felt it before.  She does report she has had a headache and general fatigue for the past several days.  She reports a history of COVID and influenza diagnosis 6 months ago in Dec 2021.  She denies any sick contacts.  She denies eating any raw, undercooked food.  She denies any fast food consumption prior to this.  She reports a loose bowel movement, which was nonbloody earlier today.  She denies drinking alcohol, smoking nicotine, or smoking marijuana.  HPI     Past Medical History:  Diagnosis Date  . Asthma   . Depression   . Diabetes mellitus type 2, uncontrolled ([place])   . Extreme obesity   . Family history of adverse reaction to anesthesia    " my grandmother had an allergic reaction  . Hypertension   . Mental disorder   . Obesity   . OSA (obstructive sleep apnea)     Patient Active Problem List   Diagnosis Date Noted  . Heart failure with preserved ejection fraction ([place]) 10/01/2020  . Obesity hypoventilation syndrome ([place]) 09/29/2020  . Acute respiratory failure with hypoxia ([place]) 09/29/2020  . Normocytic anemia 09/29/2020  . Mild concentric left ventricular hypertrophy (LVH) 09/29/2020  . NAFLD (nonalcoholic fatty liver disease) 09/29/2020  . Asthma exacerbation 09/28/2020  . Acute respiratory failure due to COVID-19 ([place]) 05/21/2020  . Gastroesophageal reflux disease without  esophagitis 06/19/2019  . Loud snoring 06/19/2019  . Type 2 diabetes mellitus without complication, without long-term current use of insulin ([place]) 03/04/2019  . Abdominal pain 03/03/2019  . HTN (hypertension) 03/03/2019  . Hyperglycemia 03/03/2019  . Proteinuria 03/03/2019  . HCAP (healthcare-associated pneumonia) 09/07/2015  . Severe sepsis ([place]) 09/07/2015  . Asthma 09/07/2015  . Pleuritic chest pain 09/01/2015  . Morbid obesity ([place]) 09/01/2015  . OSA (obstructive sleep apnea) 09/01/2015  . Influenza A 09/01/2015  . Biliary colic 70/26/3785    Past Surgical History:  Procedure Laterality Date  . APPENDECTOMY    . CHOLECYSTECTOMY N/A 09/05/2014   Procedure: LAPAROSCOPIC CHOLECYSTECTOMY;  Surgeon: Ralene Ok, MD;  Location: [facility];  Service: General;  Laterality: N/A;  . TONSILLECTOMY       OB History   No obstetric history on file.     Family History  Problem Relation Age of Onset  . Diabetes Mellitus II Maternal Grandmother     Social History   Tobacco Use  . Smoking status: Never Smoker  . Smokeless tobacco: Never Used  Vaping Use  . Vaping Use: Never used  Substance Use Topics  . Alcohol use: Yes  . Drug use: Yes    Types: Marijuana    Home Medications Prior to Admission medications   Medication Sig Start Date End Date Taking? Authorizing Provider  famotidine (PEPCID) 20 MG tablet Take 1 tablet (20 mg total) by mouth 2 (two) times daily. 10/13/20 11/12/20 Yes Jacquis Paxton, Carola Rhine, MD  ondansetron (  ZOFRAN ODT) 4 MG disintegrating tablet Take 1 tablet (4 mg total) by mouth every 8 (eight) hours as needed for up to 15 doses for nausea or vomiting. 10/13/20  Yes Wyvonnia Dusky, MD  ondansetron (ZOFRAN) 4 MG tablet Take 1 tablet (4 mg total) by mouth every 8 (eight) hours as needed for up to 15 doses for nausea or vomiting. 10/13/20  Yes Wyvonnia Dusky, MD  Accu-Chek Softclix Lancets lancets Use as instructed 10/08/20   Alexandria Lodge, MD  acetaminophen (TYLENOL)  500 MG tablet Take 1,000 mg by mouth every 6 (six) hours as needed for mild pain or fever.    [provider]  albuterol (VENTOLIN HFA) 108 (90 Base) MCG/ACT inhaler Inhale 1-2 puffs into the lungs every 6 (six) hours as needed for wheezing or shortness of breath. 03/21/19   Ladell Pier, MD  amLODipine-olmesartan (AZOR) 10-40 MG tablet Take 1 tablet by mouth daily. 10/08/20   Alexandria Lodge, MD  Blood Glucose Monitoring Suppl (ACCU-CHEK GUIDE ME) w/Device KIT Use as directed to check blood sugar once daily. 10/08/20   Alexandria Lodge, MD  cyclobenzaprine (FLEXERIL) 5 MG tablet Take 1 tablet (5 mg total) by mouth once as needed for muscle spasms. 10/08/20   Alexandria Lodge, MD  fluticasone (FLONASE) 50 MCG/ACT nasal spray Place 1 spray into both nostrils daily. 10/09/20   Alexandria Lodge, MD  glucose blood (ACCU-CHEK GUIDE) test strip Use as directed to check blood sugar once daily. 10/08/20   Alexandria Lodge, MD  guaiFENesin-dextromethorphan (ROBITUSSIN DM) 100-10 MG/5ML syrup Take 5 mLs by mouth every 4 (four) hours as needed for cough. 03/04/19   Dhungel, Nishant, MD  insulin glargine (LANTUS) 100 UNIT/ML injection Inject 0.55 mLs (55 Units total) into the skin 2 (two) times daily. 10/08/20   Alexandria Lodge, MD  Lancets Misc. (ACCU-CHEK SOFTCLIX LANCET DEV) KIT Use as directed with lancets 10/08/20   Alexandria Lodge, MD  liraglutide (VICTOZA) 18 MG/3ML SOPN Inject 0.6 mg into the skin daily for 7 days, THEN 1.2 mg daily for 21 days. 10/08/20 11/05/20  Alexandria Lodge, MD  loratadine (CLARITIN) 10 MG tablet Take 10 mg by mouth daily as needed for allergies.    [provider]  metFORMIN (GLUCOPHAGE) 500 MG tablet Take 1 tablet (500 mg total) by mouth 2 (two) times daily with a meal. 10/08/20   Alexandria Lodge, MD  metoCLOPramide (REGLAN) 5 MG tablet Take 1 tablet (5 mg total) by mouth 3 (three) times daily before meals. 10/08/20   Alexandria Lodge, MD  ondansetron (ZOFRAN ODT) 4 MG disintegrating tablet Take  1 tablet (4 mg total) by mouth every 8 (eight) hours as needed for nausea or vomiting. 09/24/20   Maudie Flakes, MD  pantoprazole (PROTONIX) 40 MG tablet Take 1 tablet (40 mg total) by mouth daily. 10/08/20   Alexandria Lodge, MD  sucralfate (CARAFATE) 1 GM/10ML suspension Take 10 mLs (1 g total) by mouth 4 (four) times daily -  with meals and at bedtime. 10/08/20   Alexandria Lodge, MD  torsemide 40 MG TABS Take 40 mg by mouth 2 (two) times daily. 10/08/20   Alexandria Lodge, MD    Allergies    Lisinopril and Fish allergy  Review of Systems   Review of Systems  Constitutional: Negative for chills and fever.  HENT: Negative for ear pain and sore throat.   Eyes: Negative for pain and visual disturbance.  Respiratory: Negative for cough and shortness of breath.   Cardiovascular: Negative for  chest pain and palpitations.  Gastrointestinal: Positive for abdominal pain, nausea and vomiting. Negative for blood in stool and constipation.  Genitourinary: Negative for dysuria and hematuria.  Musculoskeletal: Negative for arthralgias and myalgias.  Skin: Negative for color change and rash.  Neurological: Positive for headaches. Negative for syncope.  All other systems reviewed and are negative.   Physical Exam Updated Vital Signs BP 109/76   Pulse (!) 110   Temp 100.1 F (37.8 C) (Oral)   Resp 17   LMP 10/07/2020   SpO2 95%   Physical Exam Constitutional:      General: She is not in acute distress.    Appearance: She is obese.  HENT:     Head: Normocephalic and atraumatic.  Eyes:     Conjunctiva/sclera: Conjunctivae normal.     Pupils: Pupils are equal, round, and reactive to light.  Cardiovascular:     Rate and Rhythm: Normal rate and regular rhythm.  Pulmonary:     Effort: Pulmonary effort is normal. No respiratory distress.  Abdominal:     General: There is no distension.     Tenderness: There is abdominal tenderness in the epigastric area.  Skin:    General: Skin is warm and dry.   Neurological:     General: No focal deficit present.     Mental Status: She is alert. Mental status is at baseline.  Psychiatric:        Mood and Affect: Mood normal.        Behavior: Behavior normal.     ED Results / Procedures / Treatments   Labs (all labs ordered are listed, but only abnormal results are displayed) Labs Reviewed  COMPREHENSIVE METABOLIC PANEL - Abnormal; Notable for the following components:      Result Value   Chloride 96 (*)    Glucose, Bld 196 (*)    BUN 34 (*)    Creatinine, Ser 1.54 (*)    Total Protein 8.8 (*)    AST 43 (*)    GFR, Estimated 47 (*)    All other components within normal limits  CBC - Abnormal; Notable for the following components:   Hemoglobin 11.4 (*)    MCHC 29.2 (*)    RDW 15.6 (*)    Platelets 461 (*)    All other components within normal limits  LIPASE, BLOOD  URINALYSIS, ROUTINE W REFLEX MICROSCOPIC  I-STAT BETA HCG BLOOD, ED ([facility], [facility], [facility] ONLY)    EKG None  Radiology No results found.  Procedures Procedures   Medications Ordered in ED Medications  sodium chloride 0.9 % bolus 500 mL (500 mLs Intravenous New Bag/Given 10/13/20 2035)  metoCLOPramide (REGLAN) injection 10 mg (10 mg Intravenous Given 10/13/20 2036)  famotidine (PEPCID) IVPB 20 mg premix (20 mg Intravenous New Bag/Given 10/13/20 2035)  alum & mag hydroxide-simeth (MAALOX/MYLANTA) 200-200-20 MG/5ML suspension 30 mL (30 mLs Oral Given 10/13/20 2037)    And  lidocaine (XYLOCAINE) 2 % viscous mouth solution 15 mL (15 mLs Oral Given 10/13/20 2037)    ED Course  I have reviewed the triage vital signs and the nursing notes.  Pertinent labs & imaging results that were available during my care of the patient were reviewed by me and considered in my medical decision making (see chart for details).  This patient presents to the Emergency Department with complaint of abdominal pain. This involves an extensive number of treatment options, and is a complaint that carries  with it a high risk of complications and  morbidity.  The differential diagnosis includes, but is not limited to, gastritis vs biliary disease vs peptic ulcer vs constipation vs colitis vs UTI vs other  I ordered, reviewed, and interpreted labs, including CMP with Cr 1.52 (from Cr 1.0), BUN 34, CBC near baseline, Lipase 29, preg negative I ordered medication GI cocktail, IV pepcid for abdominal pain and/or nausea Previous records obtained and reviewed showing discharge on 10/08/20 for hypoxic respiratory failure  After the interventions stated above, I reevaluated the patient and found that they remained clinically stable.  Based on the patient's clinical exam, vital signs, risk factors, and ED testing, I felt that the patient's overall risk of life-threatening emergency such as bowel perforation, surgical emergency, or sepsis was quite low.  I suspect this clinical presentation is most consistent with viral gastritis, but explained to the patient that this evaluation was not a definitive diagnostic workup.  I discussed outpatient follow up with primary care provider, and provided specialist office number on the patient's discharge paper if a referral was deemed necessary.  I discussed return precautions with the patient. I felt the patient was clinically stable for discharge.  Clinical Course as of 10/13/20 2303  Nancy Fetter Oct 13, 2020  2258 Patient tolerated an entire cup of water, reports significant improvement of her epigastric pain with her medications.  I will send her home with Zofran as well as Pepcid.  I suspect is a viral gastroenteritis that is triggering some chronic gastritis issues.  We discussed her kidney function and elevated BUN, likely related to poor water intake, which she admits to.  Advised that she drink water more aggressively at home.  She verbalized understanding. [MT]    Clinical Course User Index [MT] Wyvonnia Dusky, MD    Final Clinical Impression(s) / ED  Diagnoses Final diagnoses:  Non-intractable vomiting with nausea, unspecified vomiting type  Epigastric pain    Rx / DC Orders ED Discharge Orders         Ordered    ondansetron (ZOFRAN) 4 MG tablet  Every 8 hours PRN        10/13/20 2257    famotidine (PEPCID) 20 MG tablet  2 times daily        10/13/20 2257    ondansetron (ZOFRAN ODT) 4 MG disintegrating tablet  Every 8 hours PRN        10/13/20 2259           Wyvonnia Dusky, MD 10/13/20 2303

## 2020-10-13 NOTE — ED Triage Notes (Signed)
Emergency Medicine Provider Triage Evaluation Note  Yvette Gibbs , a 30 y.o. female  was evaluated in triage.  Pt complains of RUQ abdominal and emesis x 1 day. 6 episodes of NBNB emesis. Pain is sharp and stabbing, intermittent. Surgical history includes appendectomy and cholecystectomy. Temp 100.2 PTA.   Review of Systems  Positive: Abdominal pain, nausea, diarrhea Negative: Dysuria, chills, vaginal discharge  Physical Exam  BP (!) 138/99   Pulse (!) 121   Temp 100.1 F (37.8 C) (Oral)   Resp 16   LMP 10/07/2020   SpO2 98%  Gen:   Awake, no distress   HEENT:  Atraumatic  Resp:  Normal effort  Cardiac:  Tachycardic to 121 Abd:   Nondistended, tender to RUQ and epigastric area MSK:   Moves extremities without difficulty  Neuro:  Speech clear  Medical Decision Making  Medically screening exam initiated at 6:16 PM.  Appropriate orders placed.  Kristena Vicens was informed that the remainder of the evaluation will be completed by another provider, this initial triage assessment does not replace that evaluation, and the importance of remaining in the ED until their evaluation is complete.  Clinical Impression  Labs ordered. Will move to treatment room when available.   Shanon Ace, PA-C 10/13/20 1821

## 2020-10-13 NOTE — Discharge Instructions (Addendum)
Stick to a liquid and soft diet for the next 2 days, including light foods like bananas, grapes, soup, broth  You may have a viral illness causing your symptoms.  Most of these illnesses resolve in 3 to 5 days.  You need to drink more water at home on a daily basis..  Your labs do show signs of dehydration.

## 2020-10-13 NOTE — ED Triage Notes (Signed)
Per EMS, patient from home, N/V with abdominal pain today. Ambulatory.

## 2020-10-16 DIAGNOSIS — E1165 Type 2 diabetes mellitus with hyperglycemia: Secondary | ICD-10-CM | POA: Diagnosis not present

## 2020-10-16 DIAGNOSIS — D649 Anemia, unspecified: Secondary | ICD-10-CM | POA: Diagnosis not present

## 2020-10-16 DIAGNOSIS — Z6841 Body Mass Index (BMI) 40.0 and over, adult: Secondary | ICD-10-CM | POA: Diagnosis not present

## 2020-10-16 DIAGNOSIS — I5031 Acute diastolic (congestive) heart failure: Secondary | ICD-10-CM | POA: Diagnosis not present

## 2020-10-16 DIAGNOSIS — Z9981 Dependence on supplemental oxygen: Secondary | ICD-10-CM | POA: Diagnosis not present

## 2020-10-16 DIAGNOSIS — J449 Chronic obstructive pulmonary disease, unspecified: Secondary | ICD-10-CM | POA: Diagnosis not present

## 2020-10-16 DIAGNOSIS — J9601 Acute respiratory failure with hypoxia: Secondary | ICD-10-CM | POA: Diagnosis not present

## 2020-10-16 DIAGNOSIS — F32A Depression, unspecified: Secondary | ICD-10-CM | POA: Diagnosis not present

## 2020-10-16 DIAGNOSIS — Z9181 History of falling: Secondary | ICD-10-CM | POA: Diagnosis not present

## 2020-10-16 DIAGNOSIS — Z794 Long term (current) use of insulin: Secondary | ICD-10-CM | POA: Diagnosis not present

## 2020-10-16 DIAGNOSIS — Z8616 Personal history of COVID-19: Secondary | ICD-10-CM | POA: Diagnosis not present

## 2020-10-16 DIAGNOSIS — I110 Hypertensive heart disease with heart failure: Secondary | ICD-10-CM | POA: Diagnosis not present

## 2020-10-17 ENCOUNTER — Telehealth: Payer: Self-pay | Admitting: *Deleted

## 2020-10-17 ENCOUNTER — Encounter: Payer: Medicaid Other | Admitting: Student

## 2020-10-17 NOTE — Telephone Encounter (Signed)
Patient contacted regarding missed appointment. Patient will be seen in 2 days by another office.

## 2020-10-18 ENCOUNTER — Encounter: Payer: Medicaid Other | Admitting: Student

## 2020-10-21 DIAGNOSIS — D649 Anemia, unspecified: Secondary | ICD-10-CM | POA: Diagnosis not present

## 2020-10-21 DIAGNOSIS — J45901 Unspecified asthma with (acute) exacerbation: Secondary | ICD-10-CM | POA: Diagnosis not present

## 2020-10-21 DIAGNOSIS — J9601 Acute respiratory failure with hypoxia: Secondary | ICD-10-CM | POA: Diagnosis not present

## 2020-10-21 DIAGNOSIS — E662 Morbid (severe) obesity with alveolar hypoventilation: Secondary | ICD-10-CM | POA: Diagnosis not present

## 2020-10-24 ENCOUNTER — Other Ambulatory Visit: Payer: Self-pay

## 2020-10-24 ENCOUNTER — Encounter (INDEPENDENT_AMBULATORY_CARE_PROVIDER_SITE_OTHER): Payer: Self-pay | Admitting: Primary Care

## 2020-10-24 ENCOUNTER — Ambulatory Visit (INDEPENDENT_AMBULATORY_CARE_PROVIDER_SITE_OTHER): Payer: Medicaid Other | Admitting: Primary Care

## 2020-10-24 VITALS — BP 143/102 | HR 104 | Temp 97.2°F | Ht 62.0 in | Wt 387.8 lb

## 2020-10-24 DIAGNOSIS — I10 Essential (primary) hypertension: Secondary | ICD-10-CM

## 2020-10-24 DIAGNOSIS — Z23 Encounter for immunization: Secondary | ICD-10-CM

## 2020-10-24 DIAGNOSIS — E119 Type 2 diabetes mellitus without complications: Secondary | ICD-10-CM | POA: Diagnosis not present

## 2020-10-24 DIAGNOSIS — Z7689 Persons encountering health services in other specified circumstances: Secondary | ICD-10-CM

## 2020-10-24 LAB — GLUCOSE, POCT (MANUAL RESULT ENTRY): POC Glucose: 167 mg/dl — AB (ref 70–99)

## 2020-10-24 MED ORDER — AMLODIPINE-OLMESARTAN 10-40 MG PO TABS
1.0000 | ORAL_TABLET | Freq: Every day | ORAL | 1 refills | Status: DC
Start: 2020-10-24 — End: 2021-02-18

## 2020-10-24 MED ORDER — TRULICITY 0.75 MG/0.5ML ~~LOC~~ SOAJ
0.7500 mg | SUBCUTANEOUS | 4 refills | Status: DC
Start: 2020-10-24 — End: 2021-05-07
  Filled 2020-10-24 – 2020-10-29 (×2): qty 2, 28d supply, fill #0

## 2020-10-24 MED ORDER — TRULICITY 0.75 MG/0.5ML ~~LOC~~ SOAJ: 0.7500 mg | SUBCUTANEOUS | 4 refills | Status: DC

## 2020-10-24 MED ORDER — METFORMIN HCL 1000 MG PO TABS: 1000.0000 mg | ORAL_TABLET | Freq: Two times a day (BID) | ORAL | 3 refills | Status: DC

## 2020-10-24 NOTE — Progress Notes (Signed)
New Patient Office Visit  Subjective:  Patient ID: Yvette Gibbs, female    DOB: 1991/06/09  Age: 30 y.o. MRN: [id number]  CC:  Chief Complaint  Patient presents with  . Hospitalization Follow-up    HPI  Yvette Gibbs is a 30 year old morbid obese female presents for Emergency room f/u on 10/13/20 for not feeling well, but abruptly began having epigastric pain and vomiting that morning.  She had 6 episodes of nonbloody, nonbilious vomiting.  She describes a sharp, stabbing epigastric pain. DX in ED with  Non-intractable vomiting with nausea, unspecified vomiting type.  (recently had miscarriage in April). Establishment of care with new provider. Past surgical hxcholecystectomy, appendectomy.     Past Medical History:  Diagnosis Date  . Asthma   . Depression   . Diabetes mellitus type 2, uncontrolled ([place])   . Extreme obesity   . Family history of adverse reaction to anesthesia    " my grandmother had an allergic reaction  . Hypertension   . Mental disorder   . Obesity   . OSA (obstructive sleep apnea)     Past Surgical History:  Procedure Laterality Date  . APPENDECTOMY    . CHOLECYSTECTOMY N/A 09/05/2014   Procedure: LAPAROSCOPIC CHOLECYSTECTOMY;  Surgeon: Ralene Ok, MD;  Location: [facility];  Service: General;  Laterality: N/A;  . TONSILLECTOMY      Family History  Problem Relation Age of Onset  . Diabetes Mellitus II Maternal Grandmother     Social History   Socioeconomic History  . Marital status: Single    Spouse name: Not on file  . Number of children: Not on file  . Years of education: Not on file  . Highest education level: Not on file  Occupational History  . Not on file  Tobacco Use  . Smoking status: Never Smoker  . Smokeless tobacco: Never Used  Vaping Use  . Vaping Use: Never used  Substance and Sexual Activity  . Alcohol use: Yes  . Drug use: Yes    Types: Marijuana  . Sexual activity: Yes    Birth control/protection: None   Other Topics Concern  . Not on file  Social History Narrative  . Not on file   Social Determinants of Health   Financial Resource Strain: Not on file  Food Insecurity: Not on file  Transportation Needs: Not on file  Physical Activity: Not on file  Stress: Not on file  Social Connections: Not on file  Intimate Partner Violence: Not on file    ROS Review of Systems  Gastrointestinal: Positive for abdominal pain.  All other systems reviewed and are negative.   Objective:   Today's Vitals: BP (!) 143/102 (BP Location: Right Wrist, Patient Position: Sitting, Cuff Size: Normal)   Pulse (!) 104   Temp (!) 97.2 F (36.2 C) (Temporal)   Ht _0  (1.575 m)   Wt (!) 387 lb 12.8 oz (175.9 kg)   LMP 10/07/2020   SpO2 99%   BMI 70.93 kg/m   Physical Exam Vitals reviewed.  Constitutional:      Appearance: She is obese.     Comments: morbid  HENT:     Head: Normocephalic.     Right Ear: Tympanic membrane and external ear normal.     Left Ear: Tympanic membrane and external ear normal.     Nose: Nose normal.  Eyes:     Pupils: Pupils are equal, round, and reactive to light.  Cardiovascular:  Rate and Rhythm: Normal rate and regular rhythm.  Pulmonary:     Effort: Pulmonary effort is normal.     Breath sounds: Normal breath sounds.  Abdominal:     General: Bowel sounds are normal. There is distension.     Palpations: Abdomen is soft.  Musculoskeletal:        General: Normal range of motion.     Cervical back: Normal range of motion and neck supple.  Skin:    General: Skin is warm and dry.  Neurological:     Mental Status: She is oriented to person, place, and time.  Psychiatric:        Mood and Affect: Mood normal.        Behavior: Behavior normal.        Thought Content: Thought content normal.        Judgment: Judgment normal.     Assessment & Plan:  Caliana was seen today for hospitalization follow-up.  Diagnoses and all orders for this visit:  Type  2 diabetes mellitus without complication, without long-term current use of insulin (HCC) Discussed  co- morbidities with uncontrol diabetes  Complications -diabetic retinopathy, (close your eyes ? What do you see nothing) nephropathy decrease in kidney function- can lead to dialysis-on a machine 3 days a week to filter your kidney, neuropathy- numbness and tinging in your hands and feet,  increase risk of heart attack and stroke, and amputation due to decrease wound healing and circulation. Decrease your risk by taking medication daily as prescribed, monitor carbohydrates- foods that are high in carbohydrates are the following rice, potatoes, breads, sugars, and pastas.  Reduction in the intake (eating) will assist in lowering your blood sugars. Exercise daily at least 30 minutes daily. -     Glucose (CBG) -     metFORMIN (GLUCOPHAGE) 1000 MG tablet; Take 1 tablet (1,000 mg total) by mouth 2 (two) times daily with a meal. -     Dulaglutide (TRULICITY) 3.24 MW/1.0UV SOPN; Inject 0.75 mg into the skin once a week. Discontinue Victozia   Need for Tdap vaccination -     Tdap vaccine greater than or equal to 7yo IM  Comprehensive diabetic foot examination, type 2 DM, encounter for [facility]) Completed   Encounter to establish care Establish care with new PCP  Essential hypertension Counseled on blood pressure goal of less than 130/80, low-sodium, DASH diet, medication compliance, 150 minutes of moderate intensity exercise per week. Discussed medication compliance, adverse effects. -     amLODipine-olmesartan (AZOR) 10-40 MG tablet; Take 1 tablet by mouth daily.  Morbid obesity (HCC) BMI greater than 40 due to excessive intake discussed risk factors of type 2 diabetes, she presently has hypertension, cardiovascular disease and respiratory complications.  Recommended exercising daily and monitor sodium and carbohydrate intake  Other orders -     Discontinue: Dulaglutide (TRULICITY) 2.53 GU/4.4IH SOPN;  Inject 0.75 mg into the skin once a week.    Outpatient Encounter Medications as of 10/24/2020  Medication Sig  . Accu-Chek Softclix Lancets lancets Use as instructed  . acetaminophen (TYLENOL) 500 MG tablet Take 1,000 mg by mouth every 6 (six) hours as needed for mild pain or fever.  Marland Kitchen albuterol (VENTOLIN HFA) 108 (90 Base) MCG/ACT inhaler Inhale 1-2 puffs into the lungs every 6 (six) hours as needed for wheezing or shortness of breath.  . Blood Glucose Monitoring Suppl (ACCU-CHEK GUIDE ME) w/Device KIT Use as directed to check blood sugar once daily.  . Dulaglutide (TRULICITY) 4.74  MG/0.5ML SOPN Inject 0.75 mg into the skin once a week.  . famotidine (PEPCID) 20 MG tablet Take 1 tablet (20 mg total) by mouth 2 (two) times daily.  . fluticasone (FLONASE) 50 MCG/ACT nasal spray Place 1 spray into both nostrils daily.  Marland Kitchen glucose blood (ACCU-CHEK GUIDE) test strip Use as directed to check blood sugar once daily.  . insulin glargine (LANTUS) 100 UNIT/ML injection Inject 0.55 mLs (55 Units total) into the skin 2 (two) times daily.  . Lancets Misc. (ACCU-CHEK SOFTCLIX LANCET DEV) KIT Use as directed with lancets  . loratadine (CLARITIN) 10 MG tablet Take 10 mg by mouth daily as needed for allergies.  . metFORMIN (GLUCOPHAGE) 1000 MG tablet Take 1 tablet (1,000 mg total) by mouth 2 (two) times daily with a meal.  . metoCLOPramide (REGLAN) 5 MG tablet Take 1 tablet (5 mg total) by mouth 3 (three) times daily before meals.  . pantoprazole (PROTONIX) 40 MG tablet Take 1 tablet (40 mg total) by mouth daily.  . sucralfate (CARAFATE) 1 GM/10ML suspension Take 10 mLs (1 g total) by mouth 4 (four) times daily -  with meals and at bedtime.  . torsemide 40 MG TABS Take 40 mg by mouth 2 (two) times daily.  . [DISCONTINUED] amLODipine-olmesartan (AZOR) 10-40 MG tablet Take 1 tablet by mouth daily.  . [DISCONTINUED] Dulaglutide (TRULICITY) 6.01 UX/3.2TF SOPN Inject 0.75 mg into the skin once a week.  .  [DISCONTINUED] liraglutide (VICTOZA) 18 MG/3ML SOPN Inject 0.6 mg into the skin daily for 7 days, THEN 1.2 mg daily for 21 days.  . [DISCONTINUED] metFORMIN (GLUCOPHAGE) 500 MG tablet Take 1 tablet (500 mg total) by mouth 2 (two) times daily with a meal.  . amLODipine-olmesartan (AZOR) 10-40 MG tablet Take 1 tablet by mouth daily.  . [DISCONTINUED] cyclobenzaprine (FLEXERIL) 5 MG tablet Take 1 tablet (5 mg total) by mouth once as needed for muscle spasms.  . [DISCONTINUED] guaiFENesin-dextromethorphan (ROBITUSSIN DM) 100-10 MG/5ML syrup Take 5 mLs by mouth every 4 (four) hours as needed for cough.  . [DISCONTINUED] ondansetron (ZOFRAN ODT) 4 MG disintegrating tablet Take 1 tablet (4 mg total) by mouth every 8 (eight) hours as needed for nausea or vomiting.  . [DISCONTINUED] ondansetron (ZOFRAN ODT) 4 MG disintegrating tablet Take 1 tablet (4 mg total) by mouth every 8 (eight) hours as needed for up to 15 doses for nausea or vomiting.  . [DISCONTINUED] ondansetron (ZOFRAN) 4 MG tablet Take 1 tablet (4 mg total) by mouth every 8 (eight) hours as needed for up to 15 doses for nausea or vomiting.   No facility-administered encounter medications on file as of 10/24/2020.  Kaylyn was seen today for hospitalization follow-up.  Diagnoses and all orders for this visit:  Type 2 diabetes mellitus without complication, without long-term current use of insulin (HCC) -     Glucose (CBG) Fasting 130-165   Goal of therapy: Less than 6.5 hemoglobin A1c. Decrease foods that are high in carbohydrates are the following rice, potatoes, breads, sugars, and pastas.  Reduction in the intake (eating) will assist in lowering your blood sugars. Change diet to salads vegetables and fruits  Need for Tdap vaccination -     Tdap vaccine greater than or equal to 7yo IM  Comprehensive diabetic foot examination, type 2 DM, encounter for [facility]) Completed   Encounter to establish care Establish care with new provider    Essential hypertension Counseled on blood pressure goal of less than 130/80, low-sodium, DASH diet, medication compliance,  150 minutes of moderate intensity exercise per week. Discussed medication compliance, adverse effects. Admits to not taking Bp meds before appt    Follow-up: Return in about 3 weeks (around 11/14/2020) for Bp ck .   Kerin Perna, NP

## 2020-10-24 NOTE — Patient Instructions (Signed)
Tdap (Tetanus, Diphtheria, Pertussis) Vaccine: What You Need to Know 1. Why get vaccinated? Tdap vaccine can prevent tetanus, diphtheria, and pertussis. Diphtheria and pertussis spread from person to person. Tetanus enters the body through cuts or wounds.  TETANUS (T) causes painful stiffening of the muscles. Tetanus can lead to serious health problems, including being unable to open the mouth, having trouble swallowing and breathing, or death.  DIPHTHERIA (D) can lead to difficulty breathing, heart failure, paralysis, or death.  PERTUSSIS (aP), also known as "whooping cough," can cause uncontrollable, violent coughing that makes it hard to breathe, eat, or drink. Pertussis can be extremely serious especially in babies and young children, causing pneumonia, convulsions, brain damage, or death. In teens and adults, it can cause weight loss, loss of bladder control, passing out, and rib fractures from severe coughing. 2. Tdap vaccine Tdap is only for children 7 years and older, adolescents, and adults.  Adolescents should receive a single dose of Tdap, preferably at age 11 or 12 years. Pregnant people should get a dose of Tdap during every pregnancy, preferably during the early part of the third trimester, to help protect the newborn from pertussis. Infants are most at risk for severe, life-threatening complications from pertussis. Adults who have never received Tdap should get a dose of Tdap. Also, adults should receive a booster dose of either Tdap or Td (a different vaccine that protects against tetanus and diphtheria but not pertussis) every 10 years, or after 5 years in the case of a severe or dirty wound or burn. Tdap may be given at the same time as other vaccines. 3. Talk with your health care provider Tell your vaccine provider if the person getting the vaccine:  Has had an allergic reaction after a previous dose of any vaccine that protects against tetanus, diphtheria, or pertussis, or  has any severe, life-threatening allergies  Has had a coma, decreased level of consciousness, or prolonged seizures within 7 days after a previous dose of any pertussis vaccine (DTP, DTaP, or Tdap)  Has seizures or another nervous system problem  Has ever had Guillain-Barr Syndrome (also called "GBS")  Has had severe pain or swelling after a previous dose of any vaccine that protects against tetanus or diphtheria In some cases, your health care provider may decide to postpone Tdap vaccination until a future visit. People with minor illnesses, such as a cold, may be vaccinated. People who are moderately or severely ill should usually wait until they recover before getting Tdap vaccine.  Your health care provider can give you more information. 4. Risks of a vaccine reaction  Pain, redness, or swelling where the shot was given, mild fever, headache, feeling tired, and nausea, vomiting, diarrhea, or stomachache sometimes happen after Tdap vaccination. People sometimes faint after medical procedures, including vaccination. Tell your provider if you feel dizzy or have vision changes or ringing in the ears.  As with any medicine, there is a very remote chance of a vaccine causing a severe allergic reaction, other serious injury, or death. 5. What if there is a serious problem? An allergic reaction could occur after the vaccinated person leaves the clinic. If you see signs of a severe allergic reaction (hives, swelling of the face and throat, difficulty breathing, a fast heartbeat, dizziness, or weakness), call 9-1-1 and get the person to the nearest hospital. For other signs that concern you, call your health care provider.  Adverse reactions should be reported to the Vaccine Adverse Event Reporting System (VAERS). Your health   care provider will usually file this report, or you can do it yourself. Visit the VAERS website at www.vaers.hhs.gov or call 1-800-822-7967. VAERS is only for reporting  reactions, and VAERS staff members do not give medical advice. 6. The National Vaccine Injury Compensation Program The National Vaccine Injury Compensation Program (VICP) is a federal program that was created to compensate people who may have been injured by certain vaccines. Claims regarding alleged injury or death due to vaccination have a time limit for filing, which may be as short as two years. Visit the VICP website at www.hrsa.gov/vaccinecompensation or call 1-800-338-2382 to learn about the program and about filing a claim. 7. How can I learn more?  Ask your health care provider.  Call your local or state health department.  Visit the website of the Food and Drug Administration (FDA) for vaccine package inserts and additional information at www.fda.gov/vaccines-blood-biologics/vaccines.  Contact the Centers for Disease Control and Prevention (CDC): ? Call 1-800-232-4636 (1-800-CDC-INFO) or ? Visit CDC's website at www.cdc.gov/vaccines. Vaccine Information Statement Tdap (Tetanus, Diphtheria, Pertussis) Vaccine (01/19/2020) This information is not intended to replace advice given to you by your health care provider. Make sure you discuss any questions you have with your health care provider. Document Revised: 02/14/2020 Document Reviewed: 02/14/2020 Elsevier Patient Education  2021 [employer]vier Inc.  

## 2020-10-25 DIAGNOSIS — E1165 Type 2 diabetes mellitus with hyperglycemia: Secondary | ICD-10-CM | POA: Diagnosis not present

## 2020-10-25 DIAGNOSIS — D649 Anemia, unspecified: Secondary | ICD-10-CM | POA: Diagnosis not present

## 2020-10-25 DIAGNOSIS — Z9181 History of falling: Secondary | ICD-10-CM | POA: Diagnosis not present

## 2020-10-25 DIAGNOSIS — J9601 Acute respiratory failure with hypoxia: Secondary | ICD-10-CM | POA: Diagnosis not present

## 2020-10-25 DIAGNOSIS — Z8616 Personal history of COVID-19: Secondary | ICD-10-CM | POA: Diagnosis not present

## 2020-10-25 DIAGNOSIS — Z794 Long term (current) use of insulin: Secondary | ICD-10-CM | POA: Diagnosis not present

## 2020-10-25 DIAGNOSIS — F32A Depression, unspecified: Secondary | ICD-10-CM | POA: Diagnosis not present

## 2020-10-25 DIAGNOSIS — Z6841 Body Mass Index (BMI) 40.0 and over, adult: Secondary | ICD-10-CM | POA: Diagnosis not present

## 2020-10-25 DIAGNOSIS — I110 Hypertensive heart disease with heart failure: Secondary | ICD-10-CM | POA: Diagnosis not present

## 2020-10-25 DIAGNOSIS — I5031 Acute diastolic (congestive) heart failure: Secondary | ICD-10-CM | POA: Diagnosis not present

## 2020-10-25 DIAGNOSIS — Z9981 Dependence on supplemental oxygen: Secondary | ICD-10-CM | POA: Diagnosis not present

## 2020-10-25 DIAGNOSIS — J449 Chronic obstructive pulmonary disease, unspecified: Secondary | ICD-10-CM | POA: Diagnosis not present

## 2020-10-28 ENCOUNTER — Telehealth: Payer: Self-pay

## 2020-10-28 NOTE — Telephone Encounter (Signed)
Copied from CRM [id number]. Topic: General - Other >> Oct 28, 2020  3:37 PM Gaetana Michaelis A wrote: Reason for CRM: Aimee with [facility] has made contact with the practice and would like to further discuss multiple areas of concern related to the patient  Aimee would like to know if Prov. Randa Evens will be continuing to see patient as a PCP  Aimee would like to know of any med changes related to the patient's visit on 10/24/20 as well as if home health orders should be submitted to [facility]. [place] for authorization   Patient also expressed concerns related to pharmacy coordination with a pharmacy that delivers  Please contact to further advise when possible

## 2020-10-29 ENCOUNTER — Other Ambulatory Visit: Payer: Self-pay | Admitting: Student

## 2020-10-29 ENCOUNTER — Other Ambulatory Visit: Payer: Self-pay

## 2020-10-29 ENCOUNTER — Ambulatory Visit: Payer: Medicaid Other | Admitting: Pharmacist

## 2020-11-01 DIAGNOSIS — I110 Hypertensive heart disease with heart failure: Secondary | ICD-10-CM | POA: Diagnosis not present

## 2020-11-01 DIAGNOSIS — D649 Anemia, unspecified: Secondary | ICD-10-CM | POA: Diagnosis not present

## 2020-11-01 DIAGNOSIS — J9601 Acute respiratory failure with hypoxia: Secondary | ICD-10-CM | POA: Diagnosis not present

## 2020-11-01 DIAGNOSIS — F32A Depression, unspecified: Secondary | ICD-10-CM | POA: Diagnosis not present

## 2020-11-01 DIAGNOSIS — J449 Chronic obstructive pulmonary disease, unspecified: Secondary | ICD-10-CM | POA: Diagnosis not present

## 2020-11-01 DIAGNOSIS — J45901 Unspecified asthma with (acute) exacerbation: Secondary | ICD-10-CM | POA: Diagnosis not present

## 2020-11-01 DIAGNOSIS — Z6841 Body Mass Index (BMI) 40.0 and over, adult: Secondary | ICD-10-CM | POA: Diagnosis not present

## 2020-11-01 DIAGNOSIS — E1165 Type 2 diabetes mellitus with hyperglycemia: Secondary | ICD-10-CM | POA: Diagnosis not present

## 2020-11-01 DIAGNOSIS — Z9981 Dependence on supplemental oxygen: Secondary | ICD-10-CM | POA: Diagnosis not present

## 2020-11-01 DIAGNOSIS — Z794 Long term (current) use of insulin: Secondary | ICD-10-CM | POA: Diagnosis not present

## 2020-11-01 DIAGNOSIS — Z8616 Personal history of COVID-19: Secondary | ICD-10-CM | POA: Diagnosis not present

## 2020-11-01 DIAGNOSIS — I5031 Acute diastolic (congestive) heart failure: Secondary | ICD-10-CM | POA: Diagnosis not present

## 2020-11-01 DIAGNOSIS — E662 Morbid (severe) obesity with alveolar hypoventilation: Secondary | ICD-10-CM | POA: Diagnosis not present

## 2020-11-01 DIAGNOSIS — Z9181 History of falling: Secondary | ICD-10-CM | POA: Diagnosis not present

## 2020-11-06 DIAGNOSIS — J9601 Acute respiratory failure with hypoxia: Secondary | ICD-10-CM | POA: Diagnosis not present

## 2020-11-06 DIAGNOSIS — E1165 Type 2 diabetes mellitus with hyperglycemia: Secondary | ICD-10-CM | POA: Diagnosis not present

## 2020-11-06 DIAGNOSIS — D649 Anemia, unspecified: Secondary | ICD-10-CM | POA: Diagnosis not present

## 2020-11-06 DIAGNOSIS — Z794 Long term (current) use of insulin: Secondary | ICD-10-CM | POA: Diagnosis not present

## 2020-11-06 DIAGNOSIS — I110 Hypertensive heart disease with heart failure: Secondary | ICD-10-CM | POA: Diagnosis not present

## 2020-11-06 DIAGNOSIS — Z9981 Dependence on supplemental oxygen: Secondary | ICD-10-CM | POA: Diagnosis not present

## 2020-11-06 DIAGNOSIS — Z8616 Personal history of COVID-19: Secondary | ICD-10-CM | POA: Diagnosis not present

## 2020-11-06 DIAGNOSIS — I5031 Acute diastolic (congestive) heart failure: Secondary | ICD-10-CM | POA: Diagnosis not present

## 2020-11-06 DIAGNOSIS — Z6841 Body Mass Index (BMI) 40.0 and over, adult: Secondary | ICD-10-CM | POA: Diagnosis not present

## 2020-11-06 DIAGNOSIS — J449 Chronic obstructive pulmonary disease, unspecified: Secondary | ICD-10-CM | POA: Diagnosis not present

## 2020-11-06 DIAGNOSIS — Z9181 History of falling: Secondary | ICD-10-CM | POA: Diagnosis not present

## 2020-11-06 DIAGNOSIS — F32A Depression, unspecified: Secondary | ICD-10-CM | POA: Diagnosis not present

## 2020-11-13 ENCOUNTER — Other Ambulatory Visit: Payer: Self-pay | Admitting: Student

## 2020-11-13 DIAGNOSIS — E1165 Type 2 diabetes mellitus with hyperglycemia: Secondary | ICD-10-CM | POA: Diagnosis not present

## 2020-11-13 DIAGNOSIS — D649 Anemia, unspecified: Secondary | ICD-10-CM | POA: Diagnosis not present

## 2020-11-13 DIAGNOSIS — F32A Depression, unspecified: Secondary | ICD-10-CM | POA: Diagnosis not present

## 2020-11-13 DIAGNOSIS — Z9981 Dependence on supplemental oxygen: Secondary | ICD-10-CM | POA: Diagnosis not present

## 2020-11-13 DIAGNOSIS — Z419 Encounter for procedure for purposes other than remedying health state, unspecified: Secondary | ICD-10-CM | POA: Diagnosis not present

## 2020-11-13 DIAGNOSIS — J449 Chronic obstructive pulmonary disease, unspecified: Secondary | ICD-10-CM | POA: Diagnosis not present

## 2020-11-13 DIAGNOSIS — I5031 Acute diastolic (congestive) heart failure: Secondary | ICD-10-CM | POA: Diagnosis not present

## 2020-11-13 DIAGNOSIS — J9601 Acute respiratory failure with hypoxia: Secondary | ICD-10-CM | POA: Diagnosis not present

## 2020-11-13 DIAGNOSIS — Z794 Long term (current) use of insulin: Secondary | ICD-10-CM | POA: Diagnosis not present

## 2020-11-13 DIAGNOSIS — I110 Hypertensive heart disease with heart failure: Secondary | ICD-10-CM | POA: Diagnosis not present

## 2020-11-13 DIAGNOSIS — Z6841 Body Mass Index (BMI) 40.0 and over, adult: Secondary | ICD-10-CM | POA: Diagnosis not present

## 2020-11-13 DIAGNOSIS — Z9181 History of falling: Secondary | ICD-10-CM | POA: Diagnosis not present

## 2020-11-13 DIAGNOSIS — Z8616 Personal history of COVID-19: Secondary | ICD-10-CM | POA: Diagnosis not present

## 2020-11-14 ENCOUNTER — Ambulatory Visit (INDEPENDENT_AMBULATORY_CARE_PROVIDER_SITE_OTHER): Payer: Medicaid Other | Admitting: Primary Care

## 2020-11-20 ENCOUNTER — Other Ambulatory Visit: Payer: Self-pay | Admitting: Student

## 2020-11-21 DIAGNOSIS — D649 Anemia, unspecified: Secondary | ICD-10-CM | POA: Diagnosis not present

## 2020-11-21 DIAGNOSIS — J45901 Unspecified asthma with (acute) exacerbation: Secondary | ICD-10-CM | POA: Diagnosis not present

## 2020-11-21 DIAGNOSIS — E662 Morbid (severe) obesity with alveolar hypoventilation: Secondary | ICD-10-CM | POA: Diagnosis not present

## 2020-11-21 DIAGNOSIS — J9601 Acute respiratory failure with hypoxia: Secondary | ICD-10-CM | POA: Diagnosis not present

## 2020-12-06 DIAGNOSIS — Z794 Long term (current) use of insulin: Secondary | ICD-10-CM | POA: Diagnosis not present

## 2020-12-06 DIAGNOSIS — F32A Depression, unspecified: Secondary | ICD-10-CM | POA: Diagnosis not present

## 2020-12-06 DIAGNOSIS — Z9181 History of falling: Secondary | ICD-10-CM | POA: Diagnosis not present

## 2020-12-06 DIAGNOSIS — Z6841 Body Mass Index (BMI) 40.0 and over, adult: Secondary | ICD-10-CM | POA: Diagnosis not present

## 2020-12-06 DIAGNOSIS — E1165 Type 2 diabetes mellitus with hyperglycemia: Secondary | ICD-10-CM | POA: Diagnosis not present

## 2020-12-06 DIAGNOSIS — Z9981 Dependence on supplemental oxygen: Secondary | ICD-10-CM | POA: Diagnosis not present

## 2020-12-06 DIAGNOSIS — D649 Anemia, unspecified: Secondary | ICD-10-CM | POA: Diagnosis not present

## 2020-12-06 DIAGNOSIS — Z8616 Personal history of COVID-19: Secondary | ICD-10-CM | POA: Diagnosis not present

## 2020-12-06 DIAGNOSIS — I110 Hypertensive heart disease with heart failure: Secondary | ICD-10-CM | POA: Diagnosis not present

## 2020-12-06 DIAGNOSIS — J449 Chronic obstructive pulmonary disease, unspecified: Secondary | ICD-10-CM | POA: Diagnosis not present

## 2020-12-06 DIAGNOSIS — I5031 Acute diastolic (congestive) heart failure: Secondary | ICD-10-CM | POA: Diagnosis not present

## 2020-12-06 DIAGNOSIS — J9601 Acute respiratory failure with hypoxia: Secondary | ICD-10-CM | POA: Diagnosis not present

## 2020-12-09 ENCOUNTER — Ambulatory Visit (INDEPENDENT_AMBULATORY_CARE_PROVIDER_SITE_OTHER): Payer: Medicaid Other | Admitting: Primary Care

## 2020-12-09 ENCOUNTER — Telehealth (INDEPENDENT_AMBULATORY_CARE_PROVIDER_SITE_OTHER): Payer: Self-pay

## 2020-12-09 NOTE — Telephone Encounter (Signed)
Copied from CRM [id number]. Topic: General - Other >> Dec 09, 2020 10:16 AM Marylen Ponto wrote: Reason for CRM: Denny Peon with [facility] reports that patient had missed visit for week of 11/18/20 and 11/25/20. Cb# [phone]

## 2020-12-12 NOTE — Telephone Encounter (Signed)
Noted. PCP aware.

## 2020-12-13 DIAGNOSIS — Z419 Encounter for procedure for purposes other than remedying health state, unspecified: Secondary | ICD-10-CM | POA: Diagnosis not present

## 2020-12-16 ENCOUNTER — Inpatient Hospital Stay (HOSPITAL_COMMUNITY)
Admission: EM | Admit: 2020-12-16 | Discharge: 2020-12-21 | DRG: 871 | Disposition: A | Discharge status: Home / Self Care | Payer: Medicaid Other | Attending: Internal Medicine | Admitting: Internal Medicine

## 2020-12-16 ENCOUNTER — Emergency Department (HOSPITAL_COMMUNITY): Payer: Medicaid Other

## 2020-12-16 ENCOUNTER — Encounter (HOSPITAL_COMMUNITY): Payer: Self-pay | Admitting: Emergency Medicine

## 2020-12-16 ENCOUNTER — Other Ambulatory Visit: Payer: Self-pay

## 2020-12-16 DIAGNOSIS — R0602 Shortness of breath: Secondary | ICD-10-CM | POA: Diagnosis not present

## 2020-12-16 DIAGNOSIS — Z833 Family history of diabetes mellitus: Secondary | ICD-10-CM

## 2020-12-16 DIAGNOSIS — Z6841 Body Mass Index (BMI) 40.0 and over, adult: Secondary | ICD-10-CM

## 2020-12-16 DIAGNOSIS — Z743 Need for continuous supervision: Secondary | ICD-10-CM | POA: Diagnosis not present

## 2020-12-16 DIAGNOSIS — Z20822 Contact with and (suspected) exposure to covid-19: Secondary | ICD-10-CM | POA: Diagnosis present

## 2020-12-16 DIAGNOSIS — E876 Hypokalemia: Secondary | ICD-10-CM | POA: Diagnosis not present

## 2020-12-16 DIAGNOSIS — E662 Morbid (severe) obesity with alveolar hypoventilation: Secondary | ICD-10-CM | POA: Diagnosis present

## 2020-12-16 DIAGNOSIS — A419 Sepsis, unspecified organism: Principal | ICD-10-CM | POA: Diagnosis present

## 2020-12-16 DIAGNOSIS — E873 Alkalosis: Secondary | ICD-10-CM | POA: Diagnosis present

## 2020-12-16 DIAGNOSIS — I517 Cardiomegaly: Secondary | ICD-10-CM | POA: Diagnosis not present

## 2020-12-16 DIAGNOSIS — Z7984 Long term (current) use of oral hypoglycemic drugs: Secondary | ICD-10-CM

## 2020-12-16 DIAGNOSIS — Z888 Allergy status to other drugs, medicaments and biological substances status: Secondary | ICD-10-CM

## 2020-12-16 DIAGNOSIS — J45901 Unspecified asthma with (acute) exacerbation: Secondary | ICD-10-CM | POA: Diagnosis present

## 2020-12-16 DIAGNOSIS — E1165 Type 2 diabetes mellitus with hyperglycemia: Secondary | ICD-10-CM | POA: Diagnosis present

## 2020-12-16 DIAGNOSIS — J4552 Severe persistent asthma with status asthmaticus: Secondary | ICD-10-CM | POA: Diagnosis not present

## 2020-12-16 DIAGNOSIS — J9621 Acute and chronic respiratory failure with hypoxia: Secondary | ICD-10-CM | POA: Diagnosis present

## 2020-12-16 DIAGNOSIS — I110 Hypertensive heart disease with heart failure: Secondary | ICD-10-CM | POA: Diagnosis present

## 2020-12-16 DIAGNOSIS — E1143 Type 2 diabetes mellitus with diabetic autonomic (poly)neuropathy: Secondary | ICD-10-CM | POA: Diagnosis present

## 2020-12-16 DIAGNOSIS — J441 Chronic obstructive pulmonary disease with (acute) exacerbation: Secondary | ICD-10-CM | POA: Diagnosis present

## 2020-12-16 DIAGNOSIS — K219 Gastro-esophageal reflux disease without esophagitis: Secondary | ICD-10-CM | POA: Diagnosis present

## 2020-12-16 DIAGNOSIS — I5032 Chronic diastolic (congestive) heart failure: Secondary | ICD-10-CM | POA: Diagnosis present

## 2020-12-16 DIAGNOSIS — R079 Chest pain, unspecified: Secondary | ICD-10-CM | POA: Diagnosis not present

## 2020-12-16 DIAGNOSIS — J189 Pneumonia, unspecified organism: Secondary | ICD-10-CM | POA: Diagnosis present

## 2020-12-16 DIAGNOSIS — D649 Anemia, unspecified: Secondary | ICD-10-CM | POA: Diagnosis present

## 2020-12-16 DIAGNOSIS — R0789 Other chest pain: Secondary | ICD-10-CM | POA: Diagnosis not present

## 2020-12-16 DIAGNOSIS — Z794 Long term (current) use of insulin: Secondary | ICD-10-CM

## 2020-12-16 DIAGNOSIS — Z79899 Other long term (current) drug therapy: Secondary | ICD-10-CM

## 2020-12-16 DIAGNOSIS — K3184 Gastroparesis: Secondary | ICD-10-CM | POA: Diagnosis present

## 2020-12-16 DIAGNOSIS — G4733 Obstructive sleep apnea (adult) (pediatric): Secondary | ICD-10-CM | POA: Diagnosis present

## 2020-12-16 DIAGNOSIS — Z8616 Personal history of COVID-19: Secondary | ICD-10-CM

## 2020-12-16 DIAGNOSIS — J9622 Acute and chronic respiratory failure with hypercapnia: Secondary | ICD-10-CM | POA: Diagnosis present

## 2020-12-16 DIAGNOSIS — J449 Chronic obstructive pulmonary disease, unspecified: Secondary | ICD-10-CM | POA: Diagnosis present

## 2020-12-16 DIAGNOSIS — Z8249 Family history of ischemic heart disease and other diseases of the circulatory system: Secondary | ICD-10-CM

## 2020-12-16 LAB — BASIC METABOLIC PANEL
Anion gap: 7 (ref 5–15)
BUN: 8 mg/dL (ref 6–20)
CO2: 31 mmol/L (ref 22–32)
Calcium: 9 mg/dL (ref 8.9–10.3)
Chloride: 96 mmol/L — ABNORMAL LOW (ref 98–111)
Creatinine, Ser: 0.76 mg/dL (ref 0.44–1.00)
GFR, Estimated: >60 mL/min (ref >60)
Glucose, Bld: 229 mg/dL — ABNORMAL HIGH (ref 70–99)
Potassium: 4.1 mmol/L (ref 3.5–5.1)
Sodium: 134 mmol/L — ABNORMAL LOW (ref 135–145)

## 2020-12-16 LAB — CBC WITH DIFFERENTIAL/PLATELET
Abs Immature Granulocytes: 0.03 10*3/uL (ref 0.00–0.07)
Basophils Absolute: 0 10*3/uL (ref 0.0–0.1)
Basophils Relative: 0 %
Eosinophils Absolute: 0.2 10*3/uL (ref 0.0–0.5)
Eosinophils Relative: 3 %
HCT: 36.6 % (ref 36.0–46.0)
Hemoglobin: 10.9 g/dL — ABNORMAL LOW (ref 12.0–15.0)
Immature Granulocytes: 0 %
Lymphocytes Relative: 27 %
Lymphs Abs: 2 10*3/uL (ref 0.7–4.0)
MCH: 26.8 pg (ref 26.0–34.0)
MCHC: 29.8 g/dL — ABNORMAL LOW (ref 30.0–36.0)
MCV: 89.9 fL (ref 80.0–100.0)
Monocytes Absolute: 0.9 10*3/uL (ref 0.1–1.0)
Monocytes Relative: 12 %
Neutro Abs: 4.3 10*3/uL (ref 1.7–7.7)
Neutrophils Relative %: 58 %
Platelets: 267 10*3/uL (ref 150–400)
RBC: 4.07 MIL/uL (ref 3.87–5.11)
RDW: 14.6 % (ref 11.5–15.5)
WBC: 7.5 10*3/uL (ref 4.0–10.5)
nRBC: 0 % (ref 0.0–0.2)

## 2020-12-16 LAB — I-STAT BETA HCG BLOOD, ED (MC, WL, AP ONLY): I-stat hCG, quantitative: <5 m[IU]/mL (ref <5)

## 2020-12-16 MED ORDER — ALBUTEROL SULFATE HFA 108 (90 BASE) MCG/ACT IN AERS
2.0000 | INHALATION_SPRAY | Freq: Once | RESPIRATORY_TRACT | Status: AC
  Administered 2020-12-16: 2 via RESPIRATORY_TRACT
  Filled 2020-12-16: qty 6.7

## 2020-12-16 NOTE — ED Triage Notes (Signed)
Patient arrived with EMS from home reports wheezing with SOB , dry cough and chest tightness today unrelieved by her inhaler .

## 2020-12-17 DIAGNOSIS — Z6841 Body Mass Index (BMI) 40.0 and over, adult: Secondary | ICD-10-CM | POA: Diagnosis not present

## 2020-12-17 DIAGNOSIS — J9621 Acute and chronic respiratory failure with hypoxia: Secondary | ICD-10-CM | POA: Diagnosis present

## 2020-12-17 DIAGNOSIS — K219 Gastro-esophageal reflux disease without esophagitis: Secondary | ICD-10-CM | POA: Diagnosis not present

## 2020-12-17 DIAGNOSIS — E876 Hypokalemia: Secondary | ICD-10-CM | POA: Diagnosis not present

## 2020-12-17 DIAGNOSIS — J441 Chronic obstructive pulmonary disease with (acute) exacerbation: Secondary | ICD-10-CM | POA: Diagnosis not present

## 2020-12-17 DIAGNOSIS — Z833 Family history of diabetes mellitus: Secondary | ICD-10-CM | POA: Diagnosis not present

## 2020-12-17 DIAGNOSIS — J4552 Severe persistent asthma with status asthmaticus: Secondary | ICD-10-CM | POA: Diagnosis not present

## 2020-12-17 DIAGNOSIS — Z8249 Family history of ischemic heart disease and other diseases of the circulatory system: Secondary | ICD-10-CM | POA: Diagnosis not present

## 2020-12-17 DIAGNOSIS — Z8616 Personal history of COVID-19: Secondary | ICD-10-CM | POA: Diagnosis not present

## 2020-12-17 DIAGNOSIS — G4733 Obstructive sleep apnea (adult) (pediatric): Secondary | ICD-10-CM | POA: Diagnosis not present

## 2020-12-17 DIAGNOSIS — J45901 Unspecified asthma with (acute) exacerbation: Secondary | ICD-10-CM | POA: Diagnosis present

## 2020-12-17 DIAGNOSIS — K3184 Gastroparesis: Secondary | ICD-10-CM | POA: Diagnosis present

## 2020-12-17 DIAGNOSIS — Z20822 Contact with and (suspected) exposure to covid-19: Secondary | ICD-10-CM | POA: Diagnosis not present

## 2020-12-17 DIAGNOSIS — R000 Tachycardia, unspecified: Secondary | ICD-10-CM | POA: Diagnosis not present

## 2020-12-17 DIAGNOSIS — J189 Pneumonia, unspecified organism: Secondary | ICD-10-CM | POA: Diagnosis not present

## 2020-12-17 DIAGNOSIS — J449 Chronic obstructive pulmonary disease, unspecified: Secondary | ICD-10-CM | POA: Diagnosis present

## 2020-12-17 DIAGNOSIS — E1143 Type 2 diabetes mellitus with diabetic autonomic (poly)neuropathy: Secondary | ICD-10-CM | POA: Diagnosis present

## 2020-12-17 DIAGNOSIS — R0602 Shortness of breath: Secondary | ICD-10-CM | POA: Diagnosis not present

## 2020-12-17 DIAGNOSIS — J4541 Moderate persistent asthma with (acute) exacerbation: Secondary | ICD-10-CM | POA: Diagnosis not present

## 2020-12-17 DIAGNOSIS — I110 Hypertensive heart disease with heart failure: Secondary | ICD-10-CM | POA: Diagnosis not present

## 2020-12-17 DIAGNOSIS — D649 Anemia, unspecified: Secondary | ICD-10-CM | POA: Diagnosis not present

## 2020-12-17 DIAGNOSIS — I517 Cardiomegaly: Secondary | ICD-10-CM | POA: Diagnosis not present

## 2020-12-17 DIAGNOSIS — E662 Morbid (severe) obesity with alveolar hypoventilation: Secondary | ICD-10-CM | POA: Diagnosis not present

## 2020-12-17 DIAGNOSIS — J9622 Acute and chronic respiratory failure with hypercapnia: Secondary | ICD-10-CM | POA: Diagnosis not present

## 2020-12-17 DIAGNOSIS — Z7984 Long term (current) use of oral hypoglycemic drugs: Secondary | ICD-10-CM | POA: Diagnosis not present

## 2020-12-17 DIAGNOSIS — I5032 Chronic diastolic (congestive) heart failure: Secondary | ICD-10-CM | POA: Diagnosis not present

## 2020-12-17 DIAGNOSIS — E1165 Type 2 diabetes mellitus with hyperglycemia: Secondary | ICD-10-CM | POA: Diagnosis present

## 2020-12-17 DIAGNOSIS — A419 Sepsis, unspecified organism: Secondary | ICD-10-CM | POA: Diagnosis not present

## 2020-12-17 DIAGNOSIS — E873 Alkalosis: Secondary | ICD-10-CM | POA: Diagnosis not present

## 2020-12-17 LAB — I-STAT VENOUS BLOOD GAS, ED
Acid-Base Excess: 6 mmol/L — ABNORMAL HIGH (ref 0.0–2.0)
Acid-Base Excess: 5 mmol/L — ABNORMAL HIGH (ref 0.0–2.0)
Bicarbonate: 32.7 mmol/L — ABNORMAL HIGH (ref 20.0–28.0)
Bicarbonate: 32.4 mmol/L — ABNORMAL HIGH (ref 20.0–28.0)
Calcium, Ion: 1.11 mmol/L — ABNORMAL LOW (ref 1.15–1.40)
Calcium, Ion: 1.07 mmol/L — ABNORMAL LOW (ref 1.15–1.40)
HCT: 37 % (ref 36.0–46.0)
HCT: 38 % (ref 36.0–46.0)
Hemoglobin: 12.6 g/dL (ref 12.0–15.0)
Hemoglobin: 12.9 g/dL (ref 12.0–15.0)
O2 Saturation: 99 %
O2 Saturation: 99 %
Potassium: 4.7 mmol/L (ref 3.5–5.1)
Potassium: 5.1 mmol/L (ref 3.5–5.1)
Sodium: 135 mmol/L (ref 135–145)
Sodium: 135 mmol/L (ref 135–145)
TCO2: 34 mmol/L — ABNORMAL HIGH (ref 22–32)
TCO2: 34 mmol/L — ABNORMAL HIGH (ref 22–32)
pCO2, Ven: 53.8 mmHg (ref 44.0–60.0)
pCO2, Ven: 57 mmHg (ref 44.0–60.0)
pH, Ven: 7.392 (ref 7.250–7.430)
pH, Ven: 7.363 (ref 7.250–7.430)
pO2, Ven: 128 mmHg — ABNORMAL HIGH (ref 32.0–45.0)
pO2, Ven: 170 mmHg — ABNORMAL HIGH (ref 32.0–45.0)

## 2020-12-17 LAB — I-STAT ARTERIAL BLOOD GAS, ED
Acid-Base Excess: 6 mmol/L — ABNORMAL HIGH (ref 0.0–2.0)
Bicarbonate: 35.6 mmol/L — ABNORMAL HIGH (ref 20.0–28.0)
Calcium, Ion: 1.27 mmol/L (ref 1.15–1.40)
HCT: 39 % (ref 36.0–46.0)
Hemoglobin: 13.3 g/dL (ref 12.0–15.0)
O2 Saturation: 95 %
Patient temperature: 98.3
Potassium: 4 mmol/L (ref 3.5–5.1)
Sodium: 137 mmol/L (ref 135–145)
TCO2: 38 mmol/L — ABNORMAL HIGH (ref 22–32)
pCO2 arterial: 76 mmHg (ref 32.0–48.0)
pH, Arterial: 7.277 — ABNORMAL LOW (ref 7.350–7.450)
pO2, Arterial: 91 mmHg (ref 83.0–108.0)

## 2020-12-17 LAB — BRAIN NATRIURETIC PEPTIDE: B Natriuretic Peptide: 14.9 pg/mL (ref 0.0–100.0)

## 2020-12-17 LAB — BLOOD GAS, VENOUS
Acid-Base Excess: 5.3 mmol/L — ABNORMAL HIGH (ref 0.0–2.0)
Bicarbonate: 31.5 mmol/L — ABNORMAL HIGH (ref 20.0–28.0)
FIO2: 21
O2 Saturation: 44.2 %
Patient temperature: 36.9
pCO2, Ven: 67.5 mmHg — ABNORMAL HIGH (ref 44.0–60.0)
pH, Ven: 7.291 (ref 7.250–7.430)
pO2, Ven: <31 mmHg — CL (ref 32.0–45.0)

## 2020-12-17 LAB — TROPONIN I (HIGH SENSITIVITY)
Troponin I (High Sensitivity): 9 ng/L (ref <18)
Troponin I (High Sensitivity): 10 ng/L (ref <18)

## 2020-12-17 LAB — GLUCOSE, CAPILLARY: Glucose-Capillary: 342 mg/dL — ABNORMAL HIGH (ref 70–99)

## 2020-12-17 LAB — RAPID URINE DRUG SCREEN, HOSP PERFORMED
Amphetamines: NOT DETECTED
Barbiturates: NOT DETECTED
Benzodiazepines: NOT DETECTED
Cocaine: NOT DETECTED
Opiates: NOT DETECTED
Tetrahydrocannabinol: NOT DETECTED

## 2020-12-17 LAB — HEMOGLOBIN A1C
Hgb A1c MFr Bld: 8.8 % — ABNORMAL HIGH (ref 4.8–5.6)
Mean Plasma Glucose: 205.86 mg/dL

## 2020-12-17 LAB — CBG MONITORING, ED
Glucose-Capillary: 373 mg/dL — ABNORMAL HIGH (ref 70–99)
Glucose-Capillary: 321 mg/dL — ABNORMAL HIGH (ref 70–99)
Glucose-Capillary: 330 mg/dL — ABNORMAL HIGH (ref 70–99)

## 2020-12-17 LAB — SARS CORONAVIRUS 2 (TAT 6-24 HRS): SARS Coronavirus 2: NEGATIVE

## 2020-12-17 MED ORDER — FLUTICASONE FUROATE-VILANTEROL 100-25 MCG/INH IN AEPB
1.0000 | INHALATION_SPRAY | Freq: Every day | RESPIRATORY_TRACT | Status: DC
  Administered 2020-12-18 – 2020-12-21 (×4): 1 via RESPIRATORY_TRACT
  Filled 2020-12-17: qty 28

## 2020-12-17 MED ORDER — ALBUTEROL (5 MG/ML) CONTINUOUS INHALATION SOLN
10.0000 mg/h | INHALATION_SOLUTION | RESPIRATORY_TRACT | Status: AC
  Administered 2020-12-17: 10 mg/h via RESPIRATORY_TRACT
  Filled 2020-12-17: qty 20

## 2020-12-17 MED ORDER — SUCRALFATE 1 GM/10ML PO SUSP
1.0000 g | Freq: Three times a day (TID) | ORAL | Status: DC
  Administered 2020-12-17 – 2020-12-19 (×5): 1 g via ORAL
  Filled 2020-12-17 (×12): qty 10

## 2020-12-17 MED ORDER — FUROSEMIDE 10 MG/ML IJ SOLN
80.0000 mg | Freq: Once | INTRAMUSCULAR | Status: AC
  Administered 2020-12-17: 80 mg via INTRAVENOUS
  Filled 2020-12-17: qty 8

## 2020-12-17 MED ORDER — IPRATROPIUM BROMIDE 0.02 % IN SOLN
0.5000 mg | RESPIRATORY_TRACT | Status: AC
  Administered 2020-12-17 (×3): 0.5 mg via RESPIRATORY_TRACT
  Filled 2020-12-17 (×3): qty 2.5

## 2020-12-17 MED ORDER — AMLODIPINE BESYLATE 10 MG PO TABS
10.0000 mg | ORAL_TABLET | Freq: Every day | ORAL | Status: DC
  Administered 2020-12-17 – 2020-12-21 (×5): 10 mg via ORAL
  Filled 2020-12-17 (×2): qty 1
  Filled 2020-12-17: qty 2
  Filled 2020-12-17 (×2): qty 1

## 2020-12-17 MED ORDER — IPRATROPIUM BROMIDE 0.02 % IN SOLN: 0.5000 mg | Freq: Once | RESPIRATORY_TRACT | Status: DC

## 2020-12-17 MED ORDER — PREDNISONE 20 MG PO TABS: 40.0000 mg | ORAL_TABLET | Freq: Every day | ORAL | Status: DC

## 2020-12-17 MED ORDER — LORATADINE 10 MG PO TABS
10.0000 mg | ORAL_TABLET | Freq: Every day | ORAL | Status: DC | PRN
  Administered 2020-12-18: 10 mg via ORAL
  Filled 2020-12-17: qty 1

## 2020-12-17 MED ORDER — FLUTICASONE PROPIONATE 50 MCG/ACT NA SUSP
1.0000 | Freq: Every day | NASAL | Status: DC
  Administered 2020-12-18 – 2020-12-21 (×3): 1 via NASAL
  Filled 2020-12-17: qty 16

## 2020-12-17 MED ORDER — INSULIN ASPART 100 UNIT/ML IJ SOLN
0.0000 [IU] | INTRAMUSCULAR | Status: DC
  Administered 2020-12-17 (×2): 11 [IU] via SUBCUTANEOUS
  Administered 2020-12-17: 15 [IU] via SUBCUTANEOUS
  Administered 2020-12-18 (×2): 8 [IU] via SUBCUTANEOUS
  Administered 2020-12-18: 3 [IU] via SUBCUTANEOUS
  Administered 2020-12-18: 11 [IU] via SUBCUTANEOUS
  Administered 2020-12-18: 8 [IU] via SUBCUTANEOUS
  Administered 2020-12-18: 5 [IU] via SUBCUTANEOUS
  Administered 2020-12-18: 2 [IU] via SUBCUTANEOUS
  Administered 2020-12-19: 5 [IU] via SUBCUTANEOUS
  Administered 2020-12-19: 15 [IU] via SUBCUTANEOUS
  Administered 2020-12-19: 2 [IU] via SUBCUTANEOUS
  Administered 2020-12-19: 8 [IU] via SUBCUTANEOUS
  Administered 2020-12-19: 2 [IU] via SUBCUTANEOUS
  Administered 2020-12-20 (×2): 11 [IU] via SUBCUTANEOUS
  Administered 2020-12-20: 5 [IU] via SUBCUTANEOUS
  Administered 2020-12-20: 11 [IU] via SUBCUTANEOUS
  Administered 2020-12-20 – 2020-12-21 (×4): 8 [IU] via SUBCUTANEOUS
  Administered 2020-12-21 (×2): 5 [IU] via SUBCUTANEOUS

## 2020-12-17 MED ORDER — ACETAMINOPHEN 325 MG PO TABS
650.0000 mg | ORAL_TABLET | Freq: Four times a day (QID) | ORAL | Status: DC | PRN
  Administered 2020-12-18: 650 mg via ORAL
  Filled 2020-12-17: qty 2

## 2020-12-17 MED ORDER — ALBUTEROL SULFATE (2.5 MG/3ML) 0.083% IN NEBU
5.0000 mg | INHALATION_SOLUTION | RESPIRATORY_TRACT | Status: AC
  Administered 2020-12-17 (×3): 5 mg via RESPIRATORY_TRACT
  Filled 2020-12-17 (×3): qty 6

## 2020-12-17 MED ORDER — ONDANSETRON HCL 4 MG PO TABS
4.0000 mg | ORAL_TABLET | Freq: Four times a day (QID) | ORAL | Status: DC | PRN
Start: 2020-12-17 — End: 2020-12-21

## 2020-12-17 MED ORDER — MAGNESIUM SULFATE 2 GM/50ML IV SOLN
2.0000 g | Freq: Once | INTRAVENOUS | Status: AC
  Administered 2020-12-17: 2 g via INTRAVENOUS
  Filled 2020-12-17: qty 50

## 2020-12-17 MED ORDER — IRBESARTAN 300 MG PO TABS
300.0000 mg | ORAL_TABLET | Freq: Every day | ORAL | Status: DC
  Administered 2020-12-17 – 2020-12-21 (×5): 300 mg via ORAL
  Filled 2020-12-17 (×6): qty 1

## 2020-12-17 MED ORDER — ALBUTEROL SULFATE (2.5 MG/3ML) 0.083% IN NEBU
2.5000 mg | INHALATION_SOLUTION | Freq: Three times a day (TID) | RESPIRATORY_TRACT | Status: DC
  Administered 2020-12-18 (×3): 2.5 mg via RESPIRATORY_TRACT
  Filled 2020-12-17 (×4): qty 3

## 2020-12-17 MED ORDER — SODIUM CHLORIDE 0.9% FLUSH
3.0000 mL | Freq: Two times a day (BID) | INTRAVENOUS | Status: DC
  Administered 2020-12-17 – 2020-12-20 (×7): 3 mL via INTRAVENOUS

## 2020-12-17 MED ORDER — SODIUM CHLORIDE 0.9 % IV SOLN
2.0000 g | INTRAVENOUS | Status: DC
  Administered 2020-12-17 – 2020-12-21 (×5): 2 g via INTRAVENOUS
  Filled 2020-12-17 (×5): qty 20

## 2020-12-17 MED ORDER — LABETALOL HCL 5 MG/ML IV SOLN: 5.0000 mg | INTRAVENOUS | Status: DC | PRN

## 2020-12-17 MED ORDER — TORSEMIDE 20 MG PO TABS
40.0000 mg | ORAL_TABLET | Freq: Two times a day (BID) | ORAL | Status: DC
  Administered 2020-12-17 – 2020-12-21 (×8): 40 mg via ORAL
  Filled 2020-12-17 (×8): qty 2

## 2020-12-17 MED ORDER — METOCLOPRAMIDE HCL 5 MG PO TABS
5.0000 mg | ORAL_TABLET | Freq: Three times a day (TID) | ORAL | Status: DC
  Administered 2020-12-17 – 2020-12-21 (×12): 5 mg via ORAL
  Filled 2020-12-17 (×12): qty 1

## 2020-12-17 MED ORDER — ENOXAPARIN SODIUM 80 MG/0.8ML IJ SOSY
80.0000 mg | PREFILLED_SYRINGE | INTRAMUSCULAR | Status: DC
  Administered 2020-12-17 – 2020-12-21 (×5): 80 mg via SUBCUTANEOUS
  Filled 2020-12-17 (×5): qty 0.8

## 2020-12-17 MED ORDER — AMLODIPINE-OLMESARTAN 10-40 MG PO TABS: 1.0000 | ORAL_TABLET | Freq: Every day | ORAL | Status: DC

## 2020-12-17 MED ORDER — ALBUTEROL SULFATE (2.5 MG/3ML) 0.083% IN NEBU
2.5000 mg | INHALATION_SOLUTION | Freq: Four times a day (QID) | RESPIRATORY_TRACT | Status: DC
  Administered 2020-12-17 (×2): 2.5 mg via RESPIRATORY_TRACT
  Filled 2020-12-17: qty 3

## 2020-12-17 MED ORDER — ACETAMINOPHEN 650 MG RE SUPP: 650.0000 mg | Freq: Four times a day (QID) | RECTAL | Status: DC | PRN

## 2020-12-17 MED ORDER — ALBUTEROL SULFATE (2.5 MG/3ML) 0.083% IN NEBU: 5.0000 mg | INHALATION_SOLUTION | Freq: Once | RESPIRATORY_TRACT | Status: DC

## 2020-12-17 MED ORDER — PANTOPRAZOLE SODIUM 40 MG PO TBEC
40.0000 mg | DELAYED_RELEASE_TABLET | Freq: Every day | ORAL | Status: DC
  Administered 2020-12-18 – 2020-12-21 (×4): 40 mg via ORAL
  Filled 2020-12-17 (×5): qty 1

## 2020-12-17 MED ORDER — ALBUTEROL SULFATE (2.5 MG/3ML) 0.083% IN NEBU
2.5000 mg | INHALATION_SOLUTION | RESPIRATORY_TRACT | Status: DC | PRN
  Filled 2020-12-17: qty 3

## 2020-12-17 MED ORDER — METHYLPREDNISOLONE SODIUM SUCC 125 MG IJ SOLR
125.0000 mg | Freq: Once | INTRAMUSCULAR | Status: AC
  Administered 2020-12-17: 125 mg via INTRAVENOUS
  Filled 2020-12-17: qty 2

## 2020-12-17 MED ORDER — ONDANSETRON HCL 4 MG/2ML IJ SOLN: 4.0000 mg | Freq: Four times a day (QID) | INTRAMUSCULAR | Status: DC | PRN

## 2020-12-17 MED ORDER — METHYLPREDNISOLONE SODIUM SUCC 40 MG IJ SOLR
40.0000 mg | Freq: Three times a day (TID) | INTRAMUSCULAR | Status: DC
  Administered 2020-12-17: 40 mg via INTRAVENOUS
  Filled 2020-12-17: qty 1

## 2020-12-17 MED ORDER — FAMOTIDINE 20 MG PO TABS
20.0000 mg | ORAL_TABLET | Freq: Two times a day (BID) | ORAL | Status: DC
Start: 2020-12-18 — End: 2020-12-21
  Administered 2020-12-17 – 2020-12-21 (×8): 20 mg via ORAL
  Filled 2020-12-17 (×8): qty 1

## 2020-12-17 MED ORDER — INSULIN GLARGINE 100 UNIT/ML ~~LOC~~ SOLN
45.0000 [IU] | Freq: Two times a day (BID) | SUBCUTANEOUS | Status: DC
  Administered 2020-12-17 – 2020-12-18 (×4): 45 [IU] via SUBCUTANEOUS
  Filled 2020-12-17 (×5): qty 0.45

## 2020-12-17 NOTE — ED Provider Notes (Signed)
[facility] EMERGENCY DEPARTMENT Provider Note   CSN: [id number] Arrival date & time: 12/16/20  2250     History Chief Complaint  Patient presents with   Asthma/Chest Pressure    Yvette Gibbs is a 30 y.o. female presents to the Emergency Department complaining of gradual, persistent, progressively worsening chest pain and shortness of breath onset 3 days ago.  Pt reports she "got some upsetting" news which usually makes her anxious. She reports she had some anxiety and shortness of breath that resolved.  Pt reports she had a panic attack on Sunday afternoon and has had persistent chest pain and shortness of breath.  Since that time she has used her albuterol inhaler without relief but does not have a nebulizer at home.  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  Walking makes her breathing worse.  Nothing seems to make it better at this time.  She does not currently wear oxygen at home.  The history is provided by the patient and medical records. No language interpreter was used.      Past Medical History:  Diagnosis Date   Asthma    Depression    Diabetes mellitus type 2, uncontrolled ([place])    Extreme obesity    Family history of adverse reaction to anesthesia    " my grandmother had an allergic reaction   Hypertension    Mental disorder    Obesity    OSA (obstructive sleep apnea)     Patient Active Problem List   Diagnosis Date Noted   Acute asthma exacerbation 12/17/2020   Heart failure with preserved ejection fraction ([place]) 10/01/2020   Obesity hypoventilation syndrome ([place]) 09/29/2020   Acute respiratory failure with hypoxia ([place]) 09/29/2020   Normocytic anemia 09/29/2020   Mild concentric left ventricular hypertrophy (LVH) 09/29/2020   NAFLD (nonalcoholic fatty liver disease) 09/29/2020   Asthma exacerbation 09/28/2020   Acute respiratory  failure due to COVID-19 ([place]) 05/21/2020   Gastroesophageal reflux disease without esophagitis 06/19/2019   Loud snoring 06/19/2019   Type 2 diabetes mellitus without complication, without long-term current use of insulin ([place]) 03/04/2019   Abdominal pain 03/03/2019   HTN (hypertension) 03/03/2019   Hyperglycemia 03/03/2019   Proteinuria 03/03/2019   HCAP (healthcare-associated pneumonia) 09/07/2015   Severe sepsis ([place]) 09/07/2015   Asthma 09/07/2015   Pleuritic chest pain 09/01/2015   Morbid obesity ([place]) 09/01/2015   OSA (obstructive sleep apnea) 09/01/2015   Influenza A 71/69/6789   Biliary colic 38/03/1750    Past Surgical History:  Procedure Laterality Date   APPENDECTOMY     CHOLECYSTECTOMY N/A 09/05/2014   Procedure: LAPAROSCOPIC CHOLECYSTECTOMY;  Surgeon: Ralene Ok, MD;  Location: [facility];  Service: General;  Laterality: N/A;   TONSILLECTOMY       OB History   No obstetric history on file.     Family History  Problem Relation Age of Onset   Diabetes Mellitus II Maternal Grandmother     Social History   Tobacco Use   Smoking status: Never   Smokeless tobacco: Never  Vaping Use   Vaping Use: Never used  Substance Use Topics   Alcohol use: Yes   Drug use: Yes    Types: Marijuana    Home Medications Prior to Admission medications   Medication Sig Start Date End Date Taking? Authorizing Provider  Accu-Chek Softclix Lancets lancets Use as instructed 10/08/20  Yes Alexandria Lodge, MD  acetaminophen (TYLENOL) 500 MG tablet Take 1,000 mg by mouth every 6 (six) hours as needed for mild pain or fever.   Yes [provider]  albuterol (VENTOLIN HFA) 108 (90 Base) MCG/ACT inhaler Inhale 1-2 puffs into the lungs every 6 (six) hours as needed for wheezing or shortness of breath. 03/21/19  Yes Ladell Pier, MD  amLODipine-olmesartan (AZOR) 10-40 MG tablet Take 1 tablet by mouth daily. 10/24/20  Yes Kerin Perna, NP  Blood Glucose Monitoring Suppl  (ACCU-CHEK GUIDE ME) w/Device KIT Use as directed to check blood sugar once daily. 10/08/20  Yes Alexandria Lodge, MD  Dulaglutide (TRULICITY) 4.49 QP/5.9FM SOPN Inject 0.75 mg into the skin once a week. 10/24/20  Yes Kerin Perna, NP  famotidine (PEPCID) 20 MG tablet Take 1 tablet (20 mg total) by mouth 2 (two) times daily. 10/13/20 02/22/21 Yes Trifan, Carola Rhine, MD  fluticasone (FLONASE) 50 MCG/ACT nasal spray Place 1 spray into both nostrils daily. 10/09/20  Yes Alexandria Lodge, MD  glucose blood (ACCU-CHEK GUIDE) test strip Use as directed to check blood sugar once daily. 10/08/20  Yes Alexandria Lodge, MD  insulin glargine (LANTUS) 100 UNIT/ML injection Inject 0.55 mLs (55 Units total) into the skin 2 (two) times daily. 10/08/20  Yes Alexandria Lodge, MD  Lancets Misc. (ACCU-CHEK SOFTCLIX LANCET DEV) KIT Use as directed with lancets 10/08/20  Yes Alexandria Lodge, MD  loratadine (CLARITIN) 10 MG tablet Take 10 mg by mouth daily as needed for allergies.   Yes [provider]  metFORMIN (GLUCOPHAGE) 1000 MG tablet Take 1 tablet (1,000 mg total) by mouth 2 (two) times daily with a meal. 10/24/20  Yes Kerin Perna, NP  metoCLOPramide (REGLAN) 5 MG tablet Take 1 tablet (5 mg total) by mouth 3 (three) times daily before meals. 10/08/20  Yes Alexandria Lodge, MD  omeprazole (PRILOSEC) 20 MG capsule Take 20 mg by mouth daily. 11/04/20  Yes [provider]  sucralfate (CARAFATE) 1 GM/10ML suspension Take 10 mLs (1 g total) by mouth 4 (four) times daily -  with meals and at bedtime. 10/08/20  Yes Alexandria Lodge, MD  torsemide 40 MG TABS Take 40 mg by mouth 2 (two) times daily. 10/08/20  Yes Alexandria Lodge, MD  pantoprazole (PROTONIX) 40 MG tablet Take 1 tablet (40 mg total) by mouth daily. Patient not taking: No sig reported 10/08/20   Alexandria Lodge, MD    Allergies    Fish allergy and Lisinopril  Review of Systems   Review of Systems  Constitutional:  Negative for appetite change, diaphoresis,  fatigue, fever and unexpected weight change.  HENT:  Negative for mouth sores.   Eyes:  Negative for visual disturbance.  Respiratory:  Positive for chest tightness, shortness of breath and wheezing. Negative for cough.   Cardiovascular:  Positive for chest pain.  Gastrointestinal:  Negative for abdominal pain, constipation, diarrhea, nausea and vomiting.  Endocrine: Negative for polydipsia, polyphagia and polyuria.  Genitourinary:  Negative for dysuria, frequency, hematuria and urgency.  Musculoskeletal:  Negative for back pain and neck stiffness.  Skin:  Negative for rash.  Allergic/Immunologic: Negative for immunocompromised state.  Neurological:  Negative for syncope, light-headedness and headaches.  Hematological:  Does not bruise/bleed easily.  Psychiatric/Behavioral:  Negative for sleep disturbance. The patient is not nervous/anxious.    Physical Exam Updated Vital Signs BP (!) 177/98 (BP Location: Right Arm)   Pulse (!) 110   Temp 98.3 F (36.8 C) (Oral)  Resp (!) 22   Ht 5' 2"  (1.575 m)   Wt (!) 160 kg   LMP 12/05/2020   SpO2 97%   BMI 64.52 kg/m   Physical Exam Vitals and nursing note reviewed.  Constitutional:      General: She is not in acute distress.    Appearance: She is not diaphoretic.  HENT:     Head: Normocephalic.  Eyes:     General: No scleral icterus.    Conjunctiva/sclera: Conjunctivae normal.  Cardiovascular:     Rate and Rhythm: Normal rate and regular rhythm.     Pulses: Normal pulses.          Radial pulses are 2+ on the right side and 2+ on the left side.  Pulmonary:     Effort: Tachypnea, accessory muscle usage and prolonged expiration present. No respiratory distress or retractions.     Breath sounds: No stridor. Examination of the right-lower field reveals rales. Examination of the left-lower field reveals rales. Decreased breath sounds (throughout), wheezing (expiratory, thorughout) and rales present. No rhonchi.     Comments: Equal chest  rise. Moderate increased work of breathing. Abdominal:     General: There is no distension.     Palpations: Abdomen is soft.     Tenderness: There is no abdominal tenderness. There is no guarding or rebound.  Musculoskeletal:     Cervical back: Normal range of motion.     Comments: Moves all extremities equally and without difficulty.  Skin:    General: Skin is warm and dry.     Capillary Refill: Capillary refill takes less than 2 seconds.  Neurological:     Mental Status: She is alert.     GCS: GCS eye subscore is 4. GCS verbal subscore is 5. GCS motor subscore is 6.     Comments: Speech is clear and goal oriented.  Psychiatric:        Mood and Affect: Mood normal.    ED Results / Procedures / Treatments   Labs (all labs ordered are listed, but only abnormal results are displayed) Labs Reviewed  CBC WITH DIFFERENTIAL/PLATELET - Abnormal; Notable for the following components:      Result Value   Hemoglobin 10.9 (*)    MCHC 29.8 (*)    All other components within normal limits  BASIC METABOLIC PANEL - Abnormal; Notable for the following components:   Sodium 134 (*)    Chloride 96 (*)    Glucose, Bld 229 (*)    All other components within normal limits  I-STAT ARTERIAL BLOOD GAS, ED - Abnormal; Notable for the following components:   pH, Arterial 7.277 (*)    pCO2 arterial 76.0 (*)    Bicarbonate 35.6 (*)    TCO2 38 (*)    Acid-Base Excess 6.0 (*)    All other components within normal limits  SARS CORONAVIRUS 2 (TAT 6-24 HRS)  BRAIN NATRIURETIC PEPTIDE  BLOOD GAS, ARTERIAL  I-STAT BETA HCG BLOOD, ED ([facility], [facility], [facility] ONLY)  TROPONIN I (HIGH SENSITIVITY)  TROPONIN I (HIGH SENSITIVITY)    EKG EKG Interpretation  Date/Time:  Tuesday December 17 2020 04:03:41 EDT Ventricular Rate:  104 PR Interval:  153 QRS Duration: 116 QT Interval:  361 QTC Calculation: 475 R Axis:   -10 Text Interpretation: Sinus tachycardia Incomplete right bundle branch block Confirmed by Thayer Jew [phone]) on 12/17/2020 4:30:09 AM  Radiology DG Chest 2 View  Result Date: 12/16/2020 CLINICAL DATA:  Shortness of breath EXAM: CHEST -  2 VIEW COMPARISON:  10/03/2020 FINDINGS: Mild cardiomegaly with vascular congestion. No focal opacity or pleural effusion. No pneumothorax. IMPRESSION: Mild cardiomegaly with vascular congestion Electronically Signed   By: Donavan Foil M.D.   On: 12/16/2020 23:38    Procedures Procedures   Medications Ordered in ED Medications  magnesium sulfate IVPB 2 g 50 mL (2 g Intravenous New Bag/Given 12/17/20 0605)  albuterol (PROVENTIL,VENTOLIN) solution continuous neb (has no administration in time range)  albuterol (VENTOLIN HFA) 108 (90 Base) MCG/ACT inhaler 2 puff (2 puffs Inhalation Given 12/16/20 2302)  methylPREDNISolone sodium succinate (SOLU-MEDROL) 125 mg/2 mL injection 125 mg (125 mg Intravenous Given 12/17/20 0421)  furosemide (LASIX) injection 80 mg (80 mg Intravenous Given 12/17/20 0422)  albuterol (PROVENTIL) (2.5 MG/3ML) 0.083% nebulizer solution 5 mg (5 mg Nebulization Given 12/17/20 0618)  ipratropium (ATROVENT) nebulizer solution 0.5 mg (0.5 mg Nebulization Given 12/17/20 8003)    ED Course  I have reviewed the triage vital signs and the nursing notes.  Pertinent labs & imaging results that were available during my care of the patient were reviewed by me and considered in my medical decision making (see chart for details).  Clinical Course as of 12/17/20 0640  Tue Dec 17, 2020  0434 Hemoglobin(!): 10.9 Baseline [HM]  0619 pCO2 arterial(!!): 76.0 Pt placed on bipap [HM]    Clinical Course User Index [HM] Latera Mclin, Gwenlyn Perking   MDM Rules/Calculators/A&P                          Presents with chest pain and shortness of breath.  She has a history of asthma and CHF.  Some evidence of fluid overload but expiratory wheezing on exam.  Will give albuterol, Atrovent, Solu-Medrol and Lasix and reevaluate.  Records reviewed.  Last  echocardiogram was April 2022.  EF at that time was 55 to 60% but she was diagnosed with heart failure with preserved ejection fraction during that admission due to her hypoxia and fluid overload.  Patient reports she is been taking her diuretic as directed.  5:49 AM Pt continues to have significant wheezing on exam.  Additional nebulizers pending.  Will give magnesium.  6:05 AM Pt is more lethargic than before.  ABG pending.  Pt will likely need bipap and admission.   6:16 AM ABG with hypercarbia.  Bipap initiated. Pt will be admitted.  6:39 AM Discussed patient's case with hospitalist, Dr. Myna Hidalgo.  I have recommended admission and patient (and family if present) agree with this plan. Admitting physician will place admission orders.   Final Clinical Impression(s) / ED Diagnoses Final diagnoses:  Acute on chronic respiratory failure with hypercapnia ([place])  Severe persistent asthma with status asthmaticus    Rx / DC Orders ED Discharge Orders     None        Dreux Mcgroarty, Gwenlyn Perking 12/17/20 4917    Merryl Hacker, MD 12/18/20 0020

## 2020-12-17 NOTE — ED Notes (Signed)
RT called for neb

## 2020-12-17 NOTE — H&P (Signed)
History and Physical    Margarete Gribble BJY:[id number] DOB: 07/21/1990 DOA: 12/16/2020  Referring MD/NP/PA: Mitzi Hansen, MD PCP: Kerin Perna, NP  Patient coming from: Home via EMS  Chief Complaint: Shortness of breath  I have personally briefly reviewed patient's old medical records in [place]   HPI: Yvette Gibbs is a 30 y.o. female with medical history significant of hypertension, insulin-dependent diabetes mellitus type 2, morbid obesity, and suspect OSA presents with complaints of 3 days of shortness of breath.  History is limited due to patient being currently on BiPAP.  She reports that she has been having a nonproductive cough with subjective fever, chills, and epigastric pain.  Denies having any vomiting or leg swelling.  She tried using her home inhaler without any relief in symptoms.  Last hospitalized in April of this year with acute on chronic hypoxic respiratory failure, but had not been sent for a sleep study.  At that time patient had inpatient spirometry on 4/22 which showed FEV1/FEV ratio of 23%, but did not qualify for. Her grandmother states that over the last few days she had been in significant respiratory distress with wheezing and been sleeping all the time.  ED Course: Upon admission into the emergency department patient was seen to be afebrile, pulse 105-110, respiration 21-22, blood pressures 132/65-170 7/98, and O2 saturations noted to be as low as 63% while asleep with improvement after patient placed on BiPAP.  Initial ABG revealed pH 7.277, PCO2 76, and PO2 91.  Labs significant for hemoglobin 10.9, sodium 134, glucose 229, and BNP 14.9.  Chest x-ray noted mild cardiomegaly with vascular congestion.  She had been given 125 mg of Solu-Medrol IV, multiple breathing treatments, 2 g of magnesium sulfate, and 80 mg of Lasix IV.  Review of Systems  Unable to perform ROS: Severe respiratory distress  Constitutional:  Positive for chills, fever and  malaise/fatigue.  Eyes:  Negative for pain.  Respiratory:  Positive for cough, shortness of breath and wheezing. Negative for sputum production.   Gastrointestinal:  Positive for abdominal pain. Negative for blood in stool and vomiting.  Psychiatric/Behavioral:  Negative for substance abuse.    Past Medical History:  Diagnosis Date   Asthma    Depression    Diabetes mellitus type 2, uncontrolled ([place])    Extreme obesity    Family history of adverse reaction to anesthesia    " my grandmother had an allergic reaction   Hypertension    Mental disorder    Obesity    OSA (obstructive sleep apnea)     Past Surgical History:  Procedure Laterality Date   APPENDECTOMY     CHOLECYSTECTOMY N/A 09/05/2014   Procedure: LAPAROSCOPIC CHOLECYSTECTOMY;  Surgeon: Ralene Ok, MD;  Location: [facility];  Service: General;  Laterality: N/A;   TONSILLECTOMY       reports that she has never smoked. She has never used smokeless tobacco. She reports current alcohol use. She reports current drug use. Drug: Marijuana.  Allergies  Allergen Reactions   Fish Allergy Itching and Rash   Lisinopril Rash    Caused a rash and caused her to sweat a lot.    Family History  Problem Relation Age of Onset   Diabetes Mellitus II Maternal Grandmother     Prior to Admission medications   Medication Sig Start Date End Date Taking? Authorizing Provider  Accu-Chek Softclix Lancets lancets Use as instructed 10/08/20  Yes Alexandria Lodge, MD  acetaminophen (TYLENOL) 500 MG tablet Take 1,000  mg by mouth every 6 (six) hours as needed for mild pain or fever.   Yes [provider]  albuterol (VENTOLIN HFA) 108 (90 Base) MCG/ACT inhaler Inhale 1-2 puffs into the lungs every 6 (six) hours as needed for wheezing or shortness of breath. 03/21/19  Yes Ladell Pier, MD  amLODipine-olmesartan (AZOR) 10-40 MG tablet Take 1 tablet by mouth daily. 10/24/20  Yes Kerin Perna, NP  Blood Glucose Monitoring Suppl  (ACCU-CHEK GUIDE ME) w/Device KIT Use as directed to check blood sugar once daily. 10/08/20  Yes Alexandria Lodge, MD  Dulaglutide (TRULICITY) 8.92 JJ/9.4RD SOPN Inject 0.75 mg into the skin once a week. 10/24/20  Yes Kerin Perna, NP  famotidine (PEPCID) 20 MG tablet Take 1 tablet (20 mg total) by mouth 2 (two) times daily. 10/13/20 02/22/21 Yes Trifan, Carola Rhine, MD  fluticasone (FLONASE) 50 MCG/ACT nasal spray Place 1 spray into both nostrils daily. 10/09/20  Yes Alexandria Lodge, MD  glucose blood (ACCU-CHEK GUIDE) test strip Use as directed to check blood sugar once daily. 10/08/20  Yes Alexandria Lodge, MD  insulin glargine (LANTUS) 100 UNIT/ML injection Inject 0.55 mLs (55 Units total) into the skin 2 (two) times daily. 10/08/20  Yes Alexandria Lodge, MD  Lancets Misc. (ACCU-CHEK SOFTCLIX LANCET DEV) KIT Use as directed with lancets 10/08/20  Yes Alexandria Lodge, MD  loratadine (CLARITIN) 10 MG tablet Take 10 mg by mouth daily as needed for allergies.   Yes [provider]  metFORMIN (GLUCOPHAGE) 1000 MG tablet Take 1 tablet (1,000 mg total) by mouth 2 (two) times daily with a meal. 10/24/20  Yes Kerin Perna, NP  metoCLOPramide (REGLAN) 5 MG tablet Take 1 tablet (5 mg total) by mouth 3 (three) times daily before meals. 10/08/20  Yes Alexandria Lodge, MD  omeprazole (PRILOSEC) 20 MG capsule Take 20 mg by mouth daily. 11/04/20  Yes [provider]  sucralfate (CARAFATE) 1 GM/10ML suspension Take 10 mLs (1 g total) by mouth 4 (four) times daily -  with meals and at bedtime. 10/08/20  Yes Alexandria Lodge, MD  torsemide 40 MG TABS Take 40 mg by mouth 2 (two) times daily. 10/08/20  Yes Alexandria Lodge, MD  pantoprazole (PROTONIX) 40 MG tablet Take 1 tablet (40 mg total) by mouth daily. Patient not taking: No sig reported 10/08/20   Alexandria Lodge, MD    Physical Exam:  Constitutional: Morbidly obese female who is tachypneic and intermittently falls asleep during exam, but is easily awoken Vitals:    12/17/20 0152 12/17/20 0400 12/17/20 0600 12/17/20 0645  BP: (!) 177/98 132/65 (!) 142/75 (!) 149/89  Pulse: (!) 110 (!) 108 (!) 105 (!) 110  Resp: (!) 22  (!) 21 (!) 21  Temp:      TempSrc:      SpO2: 97% 95% 100% 99%  Weight:      Height:       Eyes: PERRL, lids and conjunctivae normal ENMT: Mucous membranes are dry. Posterior pharynx clear of any exudate or lesions.  Neck: Increased neck circumference.  Unable to appreciate any JVD. Respiratory: Tachypneic with decreased aeration and expiratory wheeze appreciated.  Patient is currently on BiPAP. Cardiovascular: Tachycardic, no murmurs / rubs / gallops. No extremity edema. 2+ pedal pulses. No carotid bruits.  Abdomen: no tenderness, no masses palpated. No hepatosplenomegaly. Bowel sounds positive.  Musculoskeletal: no clubbing / cyanosis. No joint deformity upper and lower extremities. Good ROM, no contractures. Normal muscle tone.  Skin: no rashes, lesions, ulcers.  No induration Neurologic: CN 2-12 grossly intact. Sensation intact, DTR normal. Strength 5/5 in all 4.  Psychiatric: Normal judgment and insight.  Lethargic, but riented x 3. Normal mood.     Labs on Admission: I have personally reviewed following labs and imaging studies  CBC: Recent Labs  Lab 12/16/20 2304 12/17/20 0612  WBC 7.5  --   NEUTROABS 4.3  --   HGB 10.9* 13.3  HCT 36.6 39.0  MCV 89.9  --   PLT 267  --    Basic Metabolic Panel: Recent Labs  Lab 12/16/20 2304 12/17/20 0612  NA 134* 137  K 4.1 4.0  CL 96*  --   CO2 31  --   GLUCOSE 229*  --   BUN 8  --   CREATININE 0.76  --   CALCIUM 9.0  --    GFR: Estimated Creatinine Clearance: 155.5 mL/min (by C-G formula based on SCr of 0.76 mg/dL). Liver Function Tests: No results for input(s): AST, ALT, ALKPHOS, BILITOT, PROT, ALBUMIN in the last 168 hours. No results for input(s): LIPASE, AMYLASE in the last 168 hours. No results for input(s): AMMONIA in the last 168 hours. Coagulation  Profile: No results for input(s): INR, PROTIME in the last 168 hours. Cardiac Enzymes: No results for input(s): CKTOTAL, CKMB, CKMBINDEX, TROPONINI in the last 168 hours. BNP (last 3 results) No results for input(s): PROBNP in the last 8760 hours. HbA1C: No results for input(s): HGBA1C in the last 72 hours. CBG: No results for input(s): GLUCAP in the last 168 hours. Lipid Profile: No results for input(s): CHOL, HDL, LDLCALC, TRIG, CHOLHDL, LDLDIRECT in the last 72 hours. Thyroid Function Tests: No results for input(s): TSH, T4TOTAL, FREET4, T3FREE, THYROIDAB in the last 72 hours. Anemia Panel: No results for input(s): VITAMINB12, FOLATE, FERRITIN, TIBC, IRON, RETICCTPCT in the last 72 hours. Urine analysis:    Component Value Date/Time   COLORURINE AMBER (A) 09/26/2020 1848   APPEARANCEUR TURBID (A) 09/26/2020 1848   LABSPEC 1.032 (H) 09/26/2020 1848   PHURINE 5.0 09/26/2020 1848   GLUCOSEU >=500 (A) 09/26/2020 1848   HGBUR NEGATIVE 09/26/2020 1848   BILIRUBINUR NEGATIVE 09/26/2020 1848   KETONESUR 5 (A) 09/26/2020 1848   PROTEINUR >=300 (A) 09/26/2020 1848   UROBILINOGEN 0.2 09/04/2014 1001   NITRITE NEGATIVE 09/26/2020 1848   LEUKOCYTESUR NEGATIVE 09/26/2020 1848   Sepsis Labs: No results found for this or any previous visit (from the past 240 hour(s)).   Radiological Exams on Admission: DG Chest 2 View  Result Date: 12/16/2020 CLINICAL DATA:  Shortness of breath EXAM: CHEST - 2 VIEW COMPARISON:  10/03/2020 FINDINGS: Mild cardiomegaly with vascular congestion. No focal opacity or pleural effusion. No pneumothorax. IMPRESSION: Mild cardiomegaly with vascular congestion Electronically Signed   By: Donavan Foil M.D.   On: 12/16/2020 23:38    EKG: Independently reviewed.  Sinus tachycardia 104 bpm with incomplete right bundle branch block.  Assessment/Plan Hypoxic and hypercapnic respiratory failure with asthma exacerbation with severe obstructive lung disease: Acute on  chronic.  Patient presents with progressively worsening shortness of breath.  O2 saturations were noted to drop into the 60s on room air.  ABG was significant for pH 7.277, PCO2 76, and PO2 91 concerning for a respiratory acidosis with hypercapnia.  Patient had been given Solu-Medrol 125 mg IV, 2 g of magnesium sulfate IV, multiple breathing treatments, and placed on BiPAP. -Admit to a progressive bed -Continuous pulse oximetry with continuation of BiPAP -Wean as tolerated -Albuterol nebs 4  times daily every 2 hours as needed for shortness of breath/wheezing -Solu-Medrol 40 mg IV every 8 hours x2, and start prednisone 40 mg p.o. daily tomorrow -Empiric antibiotics of Rocephin IV  SIRS: Patient was initially noted to be tachycardic and tachypneic on admission reported subjective fever and chills at home.  She has been otherwise afebrile since being here at the hospital and WBC within normal limits suspect symptoms likely reactive in nature. -Continue to monitor  Heart failure with preserved EF: Chronic.  On chest x-ray patient noted to have some pulmonary vascular congestion.  BNP was within normal limits at 14.9.  Patient had initially been given furosemide 80 mg IV x1 dose.  Last echocardiogram revealed EF of 55 to 60% with normal diastolic heart function in 09/2020. -Strict I&Os -Daily weights -Continue torsemide 40 mg twice daily  Diabetes mellitus type 2 uncontrolled: Patient presents with glucose elevated up to 229.  Last hemoglobin A1c 10.9 on 09/26/2020.  Insulin regimen includes Trulicity 9.84 mg weekly, Lantus 55 units twice daily, and metformin 1000 mg twice daily. -Hypoglycemic protocols -Add on hemoglobin A1c -Hold metformin and Trulicity -Reduced home Lantus to 45 units twice daily as patient is n.p.o. currently, but is on steroids -CBGs every 4 hours with moderate SSI -Adjust insulin regimen as needed.  Patient may need to be placed on insulin drip if blood sugars continue to  rise.  Essential hypertension: On admission blood pressures elevated up to 177/98.  Home medication regimen includes amlodipine-olmesartan 10-40 mg daily. -Continue home regimen once able -Labetalol IV as needed for elevated systolic blood pressures greater than 210 or diastolic blood pressure greater than 110 with holding parameters for heart rates less than 60.  Epigastric abdominal pain: Chronic.  Patient complaining of epigastric abdominal pain.  Possibly could be multifactorial in nature given increased risk of GERD and gastroparesis. -Continue Reglan, Protonix, Carafate, and Pepcid  Normocytic anemia: Hemoglobin 10.9 g/dL which appears around patient's baseline.  She denied any reports of bleeding. -Recheck CBC in a.m.  GERD -Continue home regimen once off BiPAP  Morbid obesity OSA: BMI 64.52 kg/m.  During last hospitalization inpatient spirometry had been completed, but patient did not qualify for BiPAP at that time. -Continue CPAP nightly once able to be weaned off BiPAP  DVT prophylaxis: Lovenox Code Status: Full Family Communication: Grandmother updated over the phone Disposition Plan: Home once medically stable Consults called: None Admission status: Inpatient, likely require more than 2 midnight stay for need of  Norval Morton MD [facility]   If 7PM-7AM, please contact night-coverage   12/17/2020, 8:16 AM

## 2020-12-17 NOTE — ED Notes (Addendum)
Pt CBG 321, did not cross over to chart, CBG machine says 'rejected'.

## 2020-12-17 NOTE — ED Notes (Signed)
This RN spoke to RT who confirmed I could mix nebulizer solutions together

## 2020-12-17 NOTE — Progress Notes (Signed)
RT NOTES: Removed patient from bipap per MD and placed on 3lpm nasal cannula. Administered breathing treatment.

## 2020-12-17 NOTE — Consult Note (Signed)
Yvette Gibbs, MRN:  [id number], DOB:  Apr 08, 1991, LOS: 0 ADMISSION DATE:  12/16/2020, CONSULTATION DATE:  12/17/20 REFERRING MD:  Tamala Julian, CHIEF COMPLAINT:  Respiratory failure   History of Present Illness:  30 year old woman with BMI 32, asthma presenting with acute on chronic hypercarbia.  PCCM consulted to assist getting patient plugged in for sleep study.  Has usual symptoms of advanced OSA: nonrestorative sleep, daytime somnolence, witnessed apneas etc.  She also carries a diagnosis of asthma with intermittent responses to PRN albuterol.  Given tx for asthma flare and placed on BIPAP with fairly rapid improvement.  Patient does have a lot of stressors lately including a step father that passed away and transition in housing.  She thinks this may have triggered her asthma.  Pertinent  Medical History  Asthma Depression DM2 Class 3 obesity HTN  Significant Hospital Events: Including procedures, antibiotic start and stop dates in addition to other pertinent events   7/5 admitted  Interim History / Subjective:  Consulted.  Objective   Blood pressure (!) 122/53, pulse (!) 109, temperature 99 F (37.2 C), temperature source Axillary, resp. rate (!) 21, height 5' 2"  (1.575 m), weight (!) 160 kg, last menstrual period 12/05/2020, SpO2 98 %.    FiO2 (%):  [40 %] 40 %   Intake/Output Summary (Last 24 hours) at 12/17/2020 1309 Last data filed at 12/17/2020 1256 Gross per 24 hour  Intake --  Output 1900 ml  Net -1900 ml   Filed Weights   12/16/20 2256  Weight: (!) 160 kg    Examination: General: no acute distress on [place] HENT: malampatti 4, trachea midline Lungs: clear, no wheezing or accessory muscle use Cardiovascular: RRR, ext warm Abdomen: soft, hypoactive BS Extremities: do not think much edema but tough to tell Neuro: able to move all 4 ext, loquacious Skin: no rashes  Eos 200 Baseline pCO2 ~61 Chronic compensatory alkalosis CXR normal Resolved Hospital Problem  list   N/a  Assessment & Plan:  Acute on chronic hypercarbic respiratory failure- has OSA/OHS and possible obesity variant asthma.  Improved with BIPAP and steroids.  Would benefit from expedited split night sleep study.  If she keeps getting admitted for this issue we can pursue a NIV to reduce chances of readmission. - Start breo, continue PRN SABA; send home with breo or symbicort as maintenance - Do not think she needs more steroids but defer to primary - Would benefit from weight loss - Will send message to our navigators to try to expedite her sleep study; again if she is readmitted with this we will have to pursue to NIV route  Best Practice (right click and "Reselect all SmartList Selections" daily)  Per primary  Labs   CBC: Recent Labs  Lab 12/16/20 2304 12/17/20 0612  WBC 7.5  --   NEUTROABS 4.3  --   HGB 10.9* 13.3  HCT 36.6 39.0  MCV 89.9  --   PLT 267  --     Basic Metabolic Panel: Recent Labs  Lab 12/16/20 2304 12/17/20 0612  NA 134* 137  K 4.1 4.0  CL 96*  --   CO2 31  --   GLUCOSE 229*  --   BUN 8  --   CREATININE 0.76  --   CALCIUM 9.0  --    GFR: Estimated Creatinine Clearance: 155.5 mL/min (by C-G formula based on SCr of 0.76 mg/dL). Recent Labs  Lab 12/16/20 2304  WBC 7.5    Liver Function  Tests: No results for input(s): AST, ALT, ALKPHOS, BILITOT, PROT, ALBUMIN in the last 168 hours. No results for input(s): LIPASE, AMYLASE in the last 168 hours. No results for input(s): AMMONIA in the last 168 hours.  ABG    Component Value Date/Time   PHART 7.277 (L) 12/17/2020 0612   PCO2ART 76.0 (HH) 12/17/2020 0612   PO2ART 91 12/17/2020 0612   HCO3 35.6 (H) 12/17/2020 0612   TCO2 38 (H) 12/17/2020 0612   O2SAT 95.0 12/17/2020 0612     Coagulation Profile: No results for input(s): INR, PROTIME in the last 168 hours.  Cardiac Enzymes: No results for input(s): CKTOTAL, CKMB, CKMBINDEX, TROPONINI in the last 168 hours.  HbA1C: Hgb A1c MFr  Bld  Date/Time Value Ref Range Status  09/26/2020 12:37 PM 10.9 (H) 4.8 - 5.6 % Final    Comment:    (NOTE)         Prediabetes: 5.7 - 6.4         Diabetes: >6.4         Glycemic control for adults with diabetes: <7.0   09/17/2020 11:33 AM 9.8 (H) 4.8 - 5.6 % Final    Comment:    (NOTE) Pre diabetes:          5.7%-6.4%  Diabetes:              >6.4%  Glycemic control for   <7.0% adults with diabetes     CBG: Recent Labs  Lab 12/17/20 0902  GLUCAP 373*    Review of Systems:    Positive Symptoms in bold:  Constitutional fevers, chills, weight loss, fatigue, anorexia, malaise  Eyes decreased vision, double vision, eye irritation  Ears, Nose, Mouth, Throat sore throat, trouble swallowing, sinus congestion  Cardiovascular chest pain, paroxysmal nocturnal dyspnea, lower ext edema, palpitations   Respiratory SOB, cough, DOE, hemoptysis, wheezing  Gastrointestinal nausea, vomiting, diarrhea  Genitourinary burning with urination, trouble urinating  Musculoskeletal joint aches, joint swelling, back pain  Integumentary  rashes, skin lesions  Neurological focal weakness, focal numbness, trouble speaking, headaches  Psychiatric depression, anxiety, confusion  Endocrine polyuria, polydipsia, cold intolerance, heat intolerance  Hematologic abnormal bruising, abnormal bleeding, unexplained nose bleeds  Allergic/Immunologic recurrent infections, hives, swollen lymph nodes     Past Medical History:  She,  has a past medical history of Asthma, Depression, Diabetes mellitus type 2, uncontrolled ([place]), Extreme obesity, Family history of adverse reaction to anesthesia, Hypertension, Mental disorder, Obesity, and OSA (obstructive sleep apnea).   Surgical History:   Past Surgical History:  Procedure Laterality Date   APPENDECTOMY     CHOLECYSTECTOMY N/A 09/05/2014   Procedure: LAPAROSCOPIC CHOLECYSTECTOMY;  Surgeon: Ralene Ok, MD;  Location: [facility];  Service: General;   Laterality: N/A;   TONSILLECTOMY       Social History:   reports that she has never smoked. She has never used smokeless tobacco. She reports current alcohol use. She reports current drug use. Drug: Marijuana.   Family History:  Her family history includes Diabetes Mellitus II in her maternal grandmother.   Allergies Allergies  Allergen Reactions   Fish Allergy Itching and Rash   Lisinopril Rash    Caused a rash and caused her to sweat a lot.     Home Medications  Prior to Admission medications   Medication Sig Start Date End Date Taking? Authorizing Provider  Accu-Chek Softclix Lancets lancets Use as instructed 10/08/20  Yes Alexandria Lodge, MD  acetaminophen (TYLENOL) 500 MG tablet Take 1,000 mg by  mouth every 6 (six) hours as needed for mild pain or fever.   Yes [provider]  albuterol (VENTOLIN HFA) 108 (90 Base) MCG/ACT inhaler Inhale 1-2 puffs into the lungs every 6 (six) hours as needed for wheezing or shortness of breath. 03/21/19  Yes Ladell Pier, MD  amLODipine-olmesartan (AZOR) 10-40 MG tablet Take 1 tablet by mouth daily. 10/24/20  Yes Kerin Perna, NP  Blood Glucose Monitoring Suppl (ACCU-CHEK GUIDE ME) w/Device KIT Use as directed to check blood sugar once daily. 10/08/20  Yes Alexandria Lodge, MD  Dulaglutide (TRULICITY) 4.46 XF/0.7KU SOPN Inject 0.75 mg into the skin once a week. 10/24/20  Yes Kerin Perna, NP  famotidine (PEPCID) 20 MG tablet Take 1 tablet (20 mg total) by mouth 2 (two) times daily. 10/13/20 02/22/21 Yes Trifan, Carola Rhine, MD  fluticasone (FLONASE) 50 MCG/ACT nasal spray Place 1 spray into both nostrils daily. 10/09/20  Yes Alexandria Lodge, MD  glucose blood (ACCU-CHEK GUIDE) test strip Use as directed to check blood sugar once daily. 10/08/20  Yes Alexandria Lodge, MD  insulin glargine (LANTUS) 100 UNIT/ML injection Inject 0.55 mLs (55 Units total) into the skin 2 (two) times daily. 10/08/20  Yes Alexandria Lodge, MD  Lancets Misc. (ACCU-CHEK  SOFTCLIX LANCET DEV) KIT Use as directed with lancets 10/08/20  Yes Alexandria Lodge, MD  loratadine (CLARITIN) 10 MG tablet Take 10 mg by mouth daily as needed for allergies.   Yes [provider]  metFORMIN (GLUCOPHAGE) 1000 MG tablet Take 1 tablet (1,000 mg total) by mouth 2 (two) times daily with a meal. 10/24/20  Yes Kerin Perna, NP  metoCLOPramide (REGLAN) 5 MG tablet Take 1 tablet (5 mg total) by mouth 3 (three) times daily before meals. 10/08/20  Yes Alexandria Lodge, MD  omeprazole (PRILOSEC) 20 MG capsule Take 20 mg by mouth daily. 11/04/20  Yes [provider]  sucralfate (CARAFATE) 1 GM/10ML suspension Take 10 mLs (1 g total) by mouth 4 (four) times daily -  with meals and at bedtime. 10/08/20  Yes Alexandria Lodge, MD  torsemide 40 MG TABS Take 40 mg by mouth 2 (two) times daily. 10/08/20  Yes Alexandria Lodge, MD  pantoprazole (PROTONIX) 40 MG tablet Take 1 tablet (40 mg total) by mouth daily. Patient not taking: No sig reported 10/08/20 12/17/20  Alexandria Lodge, MD

## 2020-12-17 NOTE — ED Notes (Signed)
Pt sleeping/ resting, breathing comfortably with Bipap, VSS.

## 2020-12-17 NOTE — Progress Notes (Signed)
RT NOTES: Pt placed back on bipap by RN d/t pt being somnolent and diaphoretic. VBG pending.

## 2020-12-17 NOTE — Progress Notes (Addendum)
Inpatient Diabetes Program Recommendations  AACE/ADA: New Consensus Statement on Inpatient Glycemic Control (2015)  Target Ranges:  Prepandial:   less than 140 mg/dL      Peak postprandial:   less than 180 mg/dL (1-2 hours)      Critically ill patients:  140 - 180 mg/dL   Lab Results  Component Value Date   GLUCAP 373 (H) 12/17/2020   HGBA1C 10.9 (H) 09/26/2020    Review of Glycemic Control  Diabetes history: DM 2 Outpatient Diabetes medications: Lantus 55 units bid, Metformin 1000 mg bid, Trulicity 0.75 mg weekly Current orders for Inpatient glycemic control:  Novolog 0-15 units tid  Solumedrol 40 mg Q8 hours PO prednisone scheduled to start on 7/6 40 mg Daily  Inpatient Diabetes Program Recommendations:    - start Lantus 44 units bid (80% of home dose)  Note DM coordinator spoke with pt on 4/16 and started pt on insulin during last admission.  Thanks,  Christena Deem RN, MSN, BC-ADM Inpatient Diabetes Coordinator Team Pager [phone] (8a-5p)

## 2020-12-17 NOTE — ED Notes (Signed)
Pt sleeping, O2 sat 63%. This NT woke pt up and O2 sat went up to 72%. Triage RN made aware, pt placed on 3L [place] which improved to 97%.

## 2020-12-17 NOTE — ED Notes (Signed)
Pulmonology at [facility]

## 2020-12-17 NOTE — ED Notes (Signed)
2 failed attempts to collect VBG

## 2020-12-17 NOTE — ED Notes (Signed)
Pt much more lethargic than earlier - is able to wake up momentarily to tell me her name and year but then falls back asleep - PA made aware

## 2020-12-18 ENCOUNTER — Other Ambulatory Visit: Payer: Self-pay | Admitting: Pulmonary Disease

## 2020-12-18 DIAGNOSIS — G4733 Obstructive sleep apnea (adult) (pediatric): Secondary | ICD-10-CM | POA: Diagnosis not present

## 2020-12-18 DIAGNOSIS — J9622 Acute and chronic respiratory failure with hypercapnia: Secondary | ICD-10-CM | POA: Diagnosis not present

## 2020-12-18 DIAGNOSIS — J4552 Severe persistent asthma with status asthmaticus: Secondary | ICD-10-CM | POA: Diagnosis not present

## 2020-12-18 DIAGNOSIS — E662 Morbid (severe) obesity with alveolar hypoventilation: Secondary | ICD-10-CM

## 2020-12-18 DIAGNOSIS — E1165 Type 2 diabetes mellitus with hyperglycemia: Secondary | ICD-10-CM | POA: Diagnosis not present

## 2020-12-18 DIAGNOSIS — D649 Anemia, unspecified: Secondary | ICD-10-CM | POA: Diagnosis not present

## 2020-12-18 DIAGNOSIS — J4541 Moderate persistent asthma with (acute) exacerbation: Secondary | ICD-10-CM

## 2020-12-18 DIAGNOSIS — J449 Chronic obstructive pulmonary disease, unspecified: Secondary | ICD-10-CM | POA: Diagnosis not present

## 2020-12-18 DIAGNOSIS — R0683 Snoring: Secondary | ICD-10-CM

## 2020-12-18 DIAGNOSIS — K219 Gastro-esophageal reflux disease without esophagitis: Secondary | ICD-10-CM | POA: Diagnosis not present

## 2020-12-18 DIAGNOSIS — J9621 Acute and chronic respiratory failure with hypoxia: Secondary | ICD-10-CM | POA: Diagnosis not present

## 2020-12-18 LAB — COMPREHENSIVE METABOLIC PANEL
ALT: 32 U/L (ref 0–44)
AST: 21 U/L (ref 15–41)
Albumin: 3.1 g/dL — ABNORMAL LOW (ref 3.5–5.0)
Alkaline Phosphatase: 56 U/L (ref 38–126)
Anion gap: 9 (ref 5–15)
BUN: 17 mg/dL (ref 6–20)
CO2: 33 mmol/L — ABNORMAL HIGH (ref 22–32)
Calcium: 9.3 mg/dL (ref 8.9–10.3)
Chloride: 92 mmol/L — ABNORMAL LOW (ref 98–111)
Creatinine, Ser: 0.83 mg/dL (ref 0.44–1.00)
GFR, Estimated: >60 mL/min (ref >60)
Glucose, Bld: 245 mg/dL — ABNORMAL HIGH (ref 70–99)
Potassium: 3.9 mmol/L (ref 3.5–5.1)
Sodium: 134 mmol/L — ABNORMAL LOW (ref 135–145)
Total Bilirubin: 0.3 mg/dL (ref 0.3–1.2)
Total Protein: 7.8 g/dL (ref 6.5–8.1)

## 2020-12-18 LAB — GLUCOSE, CAPILLARY
Glucose-Capillary: 194 mg/dL — ABNORMAL HIGH (ref 70–99)
Glucose-Capillary: 196 mg/dL — ABNORMAL HIGH (ref 70–99)
Glucose-Capillary: 250 mg/dL — ABNORMAL HIGH (ref 70–99)
Glucose-Capillary: 259 mg/dL — ABNORMAL HIGH (ref 70–99)
Glucose-Capillary: 266 mg/dL — ABNORMAL HIGH (ref 70–99)
Glucose-Capillary: 274 mg/dL — ABNORMAL HIGH (ref 70–99)
Glucose-Capillary: 340 mg/dL — ABNORMAL HIGH (ref 70–99)

## 2020-12-18 LAB — CBC WITH DIFFERENTIAL/PLATELET
Abs Immature Granulocytes: 0.03 10*3/uL (ref 0.00–0.07)
Basophils Absolute: 0 10*3/uL (ref 0.0–0.1)
Basophils Relative: 0 %
Eosinophils Absolute: 0 10*3/uL (ref 0.0–0.5)
Eosinophils Relative: 0 %
HCT: 36.9 % (ref 36.0–46.0)
Hemoglobin: 10.9 g/dL — ABNORMAL LOW (ref 12.0–15.0)
Immature Granulocytes: 0 %
Lymphocytes Relative: 15 %
Lymphs Abs: 1.2 10*3/uL (ref 0.7–4.0)
MCH: 26.3 pg (ref 26.0–34.0)
MCHC: 29.5 g/dL — ABNORMAL LOW (ref 30.0–36.0)
MCV: 89.1 fL (ref 80.0–100.0)
Monocytes Absolute: 0.9 10*3/uL (ref 0.1–1.0)
Monocytes Relative: 11 %
Neutro Abs: 6.3 10*3/uL (ref 1.7–7.7)
Neutrophils Relative %: 74 %
Platelets: 288 10*3/uL (ref 150–400)
RBC: 4.14 MIL/uL (ref 3.87–5.11)
RDW: 14.6 % (ref 11.5–15.5)
WBC: 8.4 10*3/uL (ref 4.0–10.5)
nRBC: 0 % (ref 0.0–0.2)

## 2020-12-18 LAB — CBC
HCT: 37.1 % (ref 36.0–46.0)
Hemoglobin: 10.7 g/dL — ABNORMAL LOW (ref 12.0–15.0)
MCH: 25.8 pg — ABNORMAL LOW (ref 26.0–34.0)
MCHC: 28.8 g/dL — ABNORMAL LOW (ref 30.0–36.0)
MCV: 89.6 fL (ref 80.0–100.0)
Platelets: 303 10*3/uL (ref 150–400)
RBC: 4.14 MIL/uL (ref 3.87–5.11)
RDW: 14.6 % (ref 11.5–15.5)
WBC: 8.2 10*3/uL (ref 4.0–10.5)
nRBC: 0 % (ref 0.0–0.2)

## 2020-12-18 MED ORDER — COVID-19 MRNA VAC-TRIS(PFIZER) 30 MCG/0.3ML IM SUSP
0.3000 mL | Freq: Once | INTRAMUSCULAR | Status: AC
  Administered 2020-12-21: 0.3 mL via INTRAMUSCULAR
  Filled 2020-12-18: qty 0.3

## 2020-12-18 MED ORDER — SODIUM CHLORIDE 0.9 % IV SOLN
INTRAVENOUS | Status: DC | PRN
  Administered 2020-12-18: 1000 mL via INTRAVENOUS

## 2020-12-18 MED ORDER — INSULIN GLARGINE 100 UNIT/ML ~~LOC~~ SOLN
50.0000 [IU] | Freq: Two times a day (BID) | SUBCUTANEOUS | Status: DC
  Administered 2020-12-19 – 2020-12-21 (×5): 50 [IU] via SUBCUTANEOUS
  Filled 2020-12-18 (×7): qty 0.5

## 2020-12-18 NOTE — Progress Notes (Signed)
Inpatient Diabetes Program Recommendations  AACE/ADA: New Consensus Statement on Inpatient Glycemic Control (2015)  Target Ranges:  Prepandial:   less than 140 mg/dL      Peak postprandial:   less than 180 mg/dL (1-2 hours)      Critically ill patients:  140 - 180 mg/dL   Lab Results  Component Value Date   GLUCAP 250 (H) 12/18/2020   HGBA1C 8.8 (H) 12/17/2020    Review of Glycemic Control Results for Yvette Gibbs, Yvette Gibbs (MRN [id number]) as of 12/18/2020 09:13  Ref. Range 12/17/2020 15:56 12/17/2020 21:32 12/18/2020 00:18 12/18/2020 04:29 12/18/2020 08:14  Glucose-Capillary Latest Ref Range: 70 - 99 mg/dL 976 (H) 734 (H) 193 (H) 196 (H) 250 (H)   Diabetes history: DM 2 Outpatient Diabetes medications: Lantus 55 units bid, Metformin 1000 mg bid, Trulicity 0.75 mg weekly Current orders for Inpatient glycemic control:  Novolog 0-15 units tid, Lantus units BID    Solumedrol 40 mg Q8 hours PO prednisone scheduled to start on 7/6 40 mg Daily  Inpatient Diabetes Program Recommendations:    In the setting of steroids, might consider:  Home dose basal insulin-Lantus  units BID  Will continue to follow while inpatient.  Thank you, Dulce Sellar, RN, BSN Diabetes Coordinator Inpatient Diabetes Program [phone] (team pager from 8a-5p)

## 2020-12-18 NOTE — Evaluation (Signed)
Physical Therapy Evaluation Patient Details Name: Elanora Quin MRN: [id number] DOB: 1991/06/08 Today's Date: 12/18/2020   History of Present Illness  30 y.o. femal presents to [facility] ED on 12/16/2020 with SOB. In ED patient desaturating to 60s when sleeping. Pt admitted for management of hypoxic and hypercapnic respiratory failure with asthma exacerbation and severe obstructive lung disease. PMH significant for HTN, DMII, OSA, morbid obesity.  Clinical Impression  Pt presents to PT with deficits in cardiopulmonary function at this time. Pt with new supplemental oxygen needs, requiring 2L of oxygen currently to maintain saturation. Pt does mobilize well at this time, not requiring UE support to ambulate and transfer at this time. Pt will benefit from continued acute PT POC to improve pulmonary capacity and aide in an attempt to return to baseline.    Follow Up Recommendations No PT follow up    Equipment Recommendations  None recommended by PT    Recommendations for Other Services       Precautions / Restrictions Precautions Precautions: Other (comment) Precaution Comments: monitor SpO2 Restrictions Weight Bearing Restrictions: No      Mobility  Bed Mobility Overal bed mobility: Modified Independent             General bed mobility comments: increased time    Transfers Overall transfer level: Independent                  Ambulation/Gait Ambulation/Gait assistance: Independent Gait Distance (Feet): 140 Feet Assistive device: None Gait Pattern/deviations: Step-through pattern Gait velocity: functional Gait velocity interpretation: 1.31 - 2.62 ft/sec, indicative of limited community ambulator General Gait Details: pt with steady step-through gait, no significant balance deviations noted  Stairs            Wheelchair Mobility    Modified Rankin (Stroke Patients Only)       Balance Overall balance assessment: Mild deficits observed, not formally  tested                                           Pertinent Vitals/Pain Pain Assessment: No/denies pain    Home Living Family/patient expects to be discharged to:: Private residence Living Arrangements: Other relatives;Children (grandmother, kids ages 84yr/57yr/17month) Available Help at Discharge: Family;Available 24 hours/day;Friend(s) Type of Home: Apartment (just moved out of old apartment, staying in hotel currently until new apartment ready next week) Home Access: Level entry     Home Layout: One level Home Equipment:  (bariatric rollator)      Prior Function Level of Independence: Independent with assistive device(s)         Comments: pt has been utilizing bariatric rollator since last admission     Hand Dominance        Extremity/Trunk Assessment   Upper Extremity Assessment Upper Extremity Assessment: Overall WFL for tasks assessed    Lower Extremity Assessment Lower Extremity Assessment: Overall WFL for tasks assessed    Cervical / Trunk Assessment Cervical / Trunk Assessment: Other exceptions Cervical / Trunk Exceptions: morbid obesity  Communication   Communication: No difficulties  Cognition Arousal/Alertness: Awake/alert Behavior During Therapy: WFL for tasks assessed/performed Overall Cognitive Status: Within Functional Limits for tasks assessed                                        General Comments  General comments (skin integrity, edema, etc.): pt on 4L [place] upon PT arrival, weaned to 2L [place] with stable sats during mobility. PT attempts to wean to 1L [place] with desat to 88%.    Exercises     Assessment/Plan    PT Assessment Patient needs continued PT services  PT Problem List Cardiopulmonary status limiting activity       PT Treatment Interventions Therapeutic activities;Therapeutic exercise;Patient/family education;Stair training    PT Goals (Current goals can be found in the Care Plan section)  Acute  Rehab PT Goals Patient Stated Goal: to return home to her children PT Goal Formulation: With patient Time For Goal Achievement: 01/01/21 Potential to Achieve Goals: Good Additional Goals Additional Goal #1: Pt will ambulate for >125' independently reporting a DOE of 3/10 or less to indicate improved tolerance for household and community mobility.    Frequency Min 3X/week   Barriers to discharge        Co-evaluation               AM-PAC PT "6 Clicks" Mobility  Outcome Measure Help needed turning from your back to your side while in a flat bed without using bedrails?: None Help needed moving from lying on your back to sitting on the side of a flat bed without using bedrails?: None Help needed moving to and from a bed to a chair (including a wheelchair)?: None Help needed standing up from a chair using your arms (e.g., wheelchair or bedside chair)?: None Help needed to walk in hospital room?: None Help needed climbing 3-5 steps with a railing? : A Little 6 Click Score: 23    End of Session Equipment Utilized During Treatment: Oxygen Activity Tolerance: Patient tolerated treatment well Patient left: in bed;with call bell/phone within reach Nurse Communication: Mobility status PT Visit Diagnosis: Other abnormalities of gait and mobility (R26.89)    Time: 2202-5427 PT Time Calculation (min) (ACUTE ONLY): 40 min   Charges:   PT Evaluation $PT Eval Low Complexity: 1 Low          Arlyss Gandy, PT, DPT Acute Rehabilitation Pager: [phone]   Arlyss Gandy 12/18/2020, 10:18 AM

## 2020-12-18 NOTE — TOC Progression Note (Signed)
Transition of Care [facility]) - Progression Note    Patient Details  Name: Yvette Gibbs MRN: [id number] Date of Birth: 1991-02-13  [facility]) CM/SW Contact  Leone Haven, RN Phone Number: 12/18/2020, 9:12 AM  Clinical Narrative:    NCM spoke with Ian Malkin with [employer] regarding a NIV for this patient.  He is looking into qualifications.        Expected Discharge Plan and Services                                                 Social Determinants of Health (SDOH) Interventions    Readmission Risk Interventions No flowsheet data found.

## 2020-12-18 NOTE — Progress Notes (Signed)
PROGRESS NOTE    Yvette Gibbs  HMC:[id number] DOB: November 14, 1990 DOA: 12/16/2020 PCP: Kerin Perna, NP     Brief Narrative:  Yvette Gibbs is a 30 y.o. BF PMHx HTN, DM type II uncontrolled with complication, t, morbid obesity, and suspect OSA/OHS  Presents with complaints of 3 days of shortness of breath.  History is limited due to patient being currently on BiPAP.  She reports that she has been having a nonproductive cough with subjective fever, chills, and epigastric pain.  Denies having any vomiting or leg swelling.  She tried using her home inhaler without any relief in symptoms.  Last hospitalized in April of this year with acute on chronic hypoxic respiratory failure, but had not been sent for a sleep study.  At that time patient had inpatient spirometry on 4/22 which showed FEV1/FEV ratio of 23%, but did not qualify for. Her grandmother states that over the last few days she had been in significant respiratory distress with wheezing and been sleeping all the time.   ED Course: Upon admission into the emergency department patient was seen to be afebrile, pulse 105-110, respiration 21-22, blood pressures 132/65-170 7/98, and O2 saturations noted to be as low as 63% while asleep with improvement after patient placed on BiPAP.  Initial ABG revealed pH 7.277, PCO2 76, and PO2 91.  Labs significant for hemoglobin 10.9, sodium 134, glucose 229, and BNP 14.9.  Chest x-ray noted mild cardiomegaly with vascular congestion.  She had been given 125 mg of Solu-Medrol IV, multiple breathing treatments, 2 g of magnesium sulfate, and 80 mg of Lasix IV.   Subjective: A/O x4, negative CP, negative CP.  States has been trying to better control her diabetes.  States she had been promised that prior to her previous discharge a sleep study will be scheduled however sleep study not scheduled.   Assessment & Plan: Covid vaccination; unvaccinated.  Request vaccination.  7/6 vaccination request placed    Principal Problem:   Acute on chronic respiratory failure with hypoxia and hypercapnia (HCC) Active Problems:   Morbid obesity (HCC)   OSA (obstructive sleep apnea)   Gastroesophageal reflux disease without esophagitis   Normocytic anemia   Acute asthma exacerbation   Obstructive lung disease (HCC)  OHS/OSA -Continuous pulse ox - BiPAP per respiratory -Albuterol nebulizer as needed - Breo Ellipta 100-25 mcg 1 puff daily - Solu-Medrol 60 mg BID -Incentive spirometry - Flutter valve  Sepsis/ Asthma exacerbation - Upon admission patient met criteria for SIRS RR> 20, HR> 100, site of infection lungs -Treat 5 to 7-day of antibiotics - Trend procalcitonin/lactic acid  Chronic diastolic CHF  -Strict in and out - Daily weight  -CXR :  noted to have some pulmonary vascular congestion.  BNP was within normal limits at 14.9.  Patient had initially been given furosemide 80 mg IV x1 dose.  Last echocardiogram revealed EF of 55 to 60% with normal diastolic heart function in 09/2020.  Essential HTN - Amlodipine 10 mg daily - Irbesartan 300 mg daily - Labetalol IV PRN -Torsemide 40 mg twice daily     DM type II uncontrolled with hyperglycemia - 7/5 Hemoglobin A1c = 8.8  -7/6 increase Lantus 50 units BID -Moderate SSI - Metformin and Trulicity on hold  Epigastric abdominal pain:  -Chronic. Possibly could be multifactorial in nature given increased risk of GERD and gastroparesis. -7/6 no complaints today - Continue Reglan, Protonix, Carafate, and Pepcid   Normocytic anemia: (Baseline HgB~10.9 g/dL)  Lab Results  Component  Value Date   HGB 11.4 (L) 12/19/2020   HGB 10.9 (L) 12/18/2020   HGB 10.7 (L) 12/18/2020   HGB 12.9 12/17/2020   HGB 12.6 12/17/2020  -Better than baseline   GERD -Continue home regimen once off BiPAP   Morbid obesity OSA: (BMI 70.4 kg/m) .  During last hospitalization inpatient spirometry had been completed, but patient did not qualify for BiPAP at  that time. -Continue CPAP nightly once able to be weaned off BiPAP      DVT prophylaxis: Lovenox Code Status: Full Family Communication:  Status is: Inpatient    Dispo: The patient is from:               Anticipated d/c is to: Home              Anticipated d/c date is: 7/8              Patient currently unstable      Consultants:    Procedures/Significant Events:    I have personally reviewed and interpreted all radiology studies and my findings are as above.  VENTILATOR SETTINGS: BiPAP   Cultures   Antimicrobials:    Devices    LINES / TUBES:      Continuous Infusions:  cefTRIAXone (ROCEPHIN)  IV Stopped (12/17/20 1312)     Objective: Vitals:   12/18/20 0433 12/18/20 0732 12/18/20 0739 12/18/20 0815  BP: 133/68   119/79  Pulse: 98   100  Resp: 16     Temp: 98.5 F (36.9 C)   98.3 F (36.8 C)  TempSrc: Oral   Oral  SpO2: 100% 100% 100% 100%  Weight: (!) 174.6 kg     Height:        Intake/Output Summary (Last 24 hours) at 12/18/2020 0819 Last data filed at 12/18/2020 0436 Gross per 24 hour  Intake 100 ml  Output 3450 ml  Net -3350 ml   Filed Weights   12/16/20 2256 12/18/20 0433  Weight: (!) 160 kg (!) 174.6 kg    Examination:  General: A/O x4, No acute respiratory distress Eyes: negative scleral hemorrhage, negative anisocoria, negative icterus ENT: Negative Runny nose, negative gingival bleeding, Neck:  Negative scars, masses, torticollis, lymphadenopathy, JVD Lungs: Clear to auscultation bilaterally without wheezes or crackles Cardiovascular: Regular rate and rhythm without murmur gallop or rub normal S1 and S2 Abdomen: MORBIDLY OBESE abdominal pain, nondistended, positive soft, bowel sounds, no rebound, no ascites, no appreciable mass Extremities: No significant cyanosis, clubbing, or edema bilateral lower extremities Skin: Negative rashes, lesions, ulcers Psychiatric:  Negative depression, negative anxiety, negative fatigue,  negative mania  Central nervous system:  Cranial nerves II through XII intact, tongue/uvula midline, all extremities muscle strength 5/5, sensation intact throughout, negative dysarthria, negative expressive aphasia, negative receptive aphasia.  .     Data Reviewed: Care during the described time interval was provided by me .  I have reviewed this patient's available data, including medical history, events of note, physical examination, and all test results as part of my evaluation.  CBC: Recent Labs  Lab 12/16/20 2304 12/17/20 0612 12/17/20 1342 12/17/20 1617 12/18/20 0323  WBC 7.5  --   --   --  8.2  NEUTROABS 4.3  --   --   --   --   HGB 10.9* 13.3 12.6 12.9 10.7*  HCT 36.6 39.0 37.0 38.0 37.1  MCV 89.9  --   --   --  89.6  PLT 267  --   --   --  284   Basic Metabolic Panel: Recent Labs  Lab 12/16/20 2304 12/17/20 0612 12/17/20 1342 12/17/20 1617 12/18/20 0323  NA 134* 137 135 135 134*  K 4.1 4.0 4.7 5.1 3.9  CL 96*  --   --   --  92*  CO2 31  --   --   --  33*  GLUCOSE 229*  --   --   --  245*  BUN 8  --   --   --  17  CREATININE 0.76  --   --   --  0.83  CALCIUM 9.0  --   --   --  9.3   GFR: Estimated Creatinine Clearance: 159.1 mL/min (by C-G formula based on SCr of 0.83 mg/dL). Liver Function Tests: Recent Labs  Lab 12/18/20 0323  AST 21  ALT 32  ALKPHOS 56  BILITOT 0.3  PROT 7.8  ALBUMIN 3.1*   No results for input(s): LIPASE, AMYLASE in the last 168 hours. No results for input(s): AMMONIA in the last 168 hours. Coagulation Profile: No results for input(s): INR, PROTIME in the last 168 hours. Cardiac Enzymes: No results for input(s): CKTOTAL, CKMB, CKMBINDEX, TROPONINI in the last 168 hours. BNP (last 3 results) No results for input(s): PROBNP in the last 8760 hours. HbA1C: Recent Labs    12/17/20 1919  HGBA1C 8.8*   CBG: Recent Labs  Lab 12/17/20 1556 12/17/20 2132 12/18/20 0018 12/18/20 0429 12/18/20 0814  GLUCAP 330* 342* 340*  196* 250*   Lipid Profile: No results for input(s): CHOL, HDL, LDLCALC, TRIG, CHOLHDL, LDLDIRECT in the last 72 hours. Thyroid Function Tests: No results for input(s): TSH, T4TOTAL, FREET4, T3FREE, THYROIDAB in the last 72 hours. Anemia Panel: No results for input(s): VITAMINB12, FOLATE, FERRITIN, TIBC, IRON, RETICCTPCT in the last 72 hours. Sepsis Labs: No results for input(s): PROCALCITON, LATICACIDVEN in the last 168 hours.  Recent Results (from the past 240 hour(s))  SARS CORONAVIRUS 2 (TAT 6-24 HRS) Nasopharyngeal Nasopharyngeal Swab     Status: None   Collection Time: 12/17/20  6:37 AM   Specimen: Nasopharyngeal Swab  Result Value Ref Range Status   SARS Coronavirus 2 NEGATIVE NEGATIVE Final    Comment: (NOTE) SARS-CoV-2 target nucleic acids are NOT DETECTED.  The SARS-CoV-2 RNA is generally detectable in upper and lower respiratory specimens during the acute phase of infection. Negative results do not preclude SARS-CoV-2 infection, do not rule out co-infections with other pathogens, and should not be used as the sole basis for treatment or other patient management decisions. Negative results must be combined with clinical observations, patient history, and epidemiological information. The expected result is Negative.  Fact Sheet for Patients: SugarRoll.be  Fact Sheet for Healthcare Providers: https://www.[employer]-mathews.com/  This test is not yet approved or cleared by the [place] FDA and  has been authorized for detection and/or diagnosis of SARS-CoV-2 by FDA under an Emergency Use Authorization (EUA). This EUA will remain  in effect (meaning this test can be used) for the duration of the COVID-19 declaration under Se ction 564(b)(1) of the Act, 21 U.S.C. section 360bbb-3(b)(1), unless the authorization is terminated or revoked sooner.  Performed at [facility], [place] [address], [place], [place] [address]           Radiology Studies: DG Chest 2 View  Result Date: 12/16/2020 CLINICAL DATA:  Shortness of breath EXAM: CHEST - 2 VIEW COMPARISON:  10/03/2020 FINDINGS: Mild cardiomegaly with vascular congestion. No focal opacity or pleural effusion. No  pneumothorax. IMPRESSION: Mild cardiomegaly with vascular congestion Electronically Signed   By: Donavan Foil M.D.   On: 12/16/2020 23:38        Scheduled Meds:  albuterol  2.5 mg Nebulization TID   amLODipine  10 mg Oral Daily   enoxaparin (LOVENOX) injection  80 mg Subcutaneous Q24H   famotidine  20 mg Oral BID   fluticasone  1 spray Each Nare Daily   fluticasone furoate-vilanterol  1 puff Inhalation Daily   insulin aspart  0-15 Units Subcutaneous Q4H   insulin glargine  45 Units Subcutaneous BID   irbesartan  300 mg Oral Daily   metoCLOPramide  5 mg Oral TID AC   pantoprazole  40 mg Oral Daily   sodium chloride flush  3 mL Intravenous Q12H   sucralfate  1 g Oral TID WC & HS   torsemide  40 mg Oral BID   Continuous Infusions:  cefTRIAXone (ROCEPHIN)  IV Stopped (12/17/20 1312)     LOS: 1 day    Time spent:40 min    Kallista Pae, Geraldo Docker, MD [facility]   If 7PM-7AM, please contact night-coverage 12/18/2020, 8:19 AM

## 2020-12-18 NOTE — Progress Notes (Signed)
Ms. Yvette Gibbs continues to exhibit acute on chronic hypercarbic respiratory failure due to obesity hypoventilation syndrome.  The use of the NIV will treat patient's high PC02 levels (76 on 12/17/20 with elevated bicarbonate of 35.6) and can reduce risk of exacerbations and future hospitalizations when used at night and during the day. Ms. Yvette Gibbs has had 6 ED visits and three hospital admissions in the past 7 months.  All alternate devices [id number] and [id number]) have been considered and ruled out as volume requirements are not met by BiLevel devices.  Interruption or failure to provide NIV would quickly lead to exacerbation of the patient's condition, hospital admission, and likely harm to the patient. Continued use is preferred.  Patient is able to protect their airways and clear secretions on their own.

## 2020-12-19 DIAGNOSIS — J4541 Moderate persistent asthma with (acute) exacerbation: Secondary | ICD-10-CM | POA: Diagnosis not present

## 2020-12-19 DIAGNOSIS — J9622 Acute and chronic respiratory failure with hypercapnia: Secondary | ICD-10-CM | POA: Diagnosis not present

## 2020-12-19 DIAGNOSIS — G4733 Obstructive sleep apnea (adult) (pediatric): Secondary | ICD-10-CM | POA: Diagnosis not present

## 2020-12-19 DIAGNOSIS — J449 Chronic obstructive pulmonary disease, unspecified: Secondary | ICD-10-CM | POA: Diagnosis not present

## 2020-12-19 DIAGNOSIS — J4552 Severe persistent asthma with status asthmaticus: Secondary | ICD-10-CM | POA: Diagnosis not present

## 2020-12-19 DIAGNOSIS — J9621 Acute and chronic respiratory failure with hypoxia: Secondary | ICD-10-CM | POA: Diagnosis not present

## 2020-12-19 DIAGNOSIS — E662 Morbid (severe) obesity with alveolar hypoventilation: Secondary | ICD-10-CM | POA: Diagnosis not present

## 2020-12-19 DIAGNOSIS — D649 Anemia, unspecified: Secondary | ICD-10-CM | POA: Diagnosis not present

## 2020-12-19 DIAGNOSIS — K219 Gastro-esophageal reflux disease without esophagitis: Secondary | ICD-10-CM | POA: Diagnosis not present

## 2020-12-19 DIAGNOSIS — E1165 Type 2 diabetes mellitus with hyperglycemia: Secondary | ICD-10-CM | POA: Diagnosis not present

## 2020-12-19 LAB — GLUCOSE, CAPILLARY
Glucose-Capillary: 129 mg/dL — ABNORMAL HIGH (ref 70–99)
Glucose-Capillary: 127 mg/dL — ABNORMAL HIGH (ref 70–99)
Glucose-Capillary: 213 mg/dL — ABNORMAL HIGH (ref 70–99)
Glucose-Capillary: 275 mg/dL — ABNORMAL HIGH (ref 70–99)
Glucose-Capillary: 395 mg/dL — ABNORMAL HIGH (ref 70–99)

## 2020-12-19 LAB — CBC WITH DIFFERENTIAL/PLATELET
Abs Immature Granulocytes: 0.03 10*3/uL (ref 0.00–0.07)
Basophils Absolute: 0 10*3/uL (ref 0.0–0.1)
Basophils Relative: 1 %
Eosinophils Absolute: 0.1 10*3/uL (ref 0.0–0.5)
Eosinophils Relative: 1 %
HCT: 39.2 % (ref 36.0–46.0)
Hemoglobin: 11.4 g/dL — ABNORMAL LOW (ref 12.0–15.0)
Immature Granulocytes: 0 %
Lymphocytes Relative: 30 %
Lymphs Abs: 2.4 10*3/uL (ref 0.7–4.0)
MCH: 25.9 pg — ABNORMAL LOW (ref 26.0–34.0)
MCHC: 29.1 g/dL — ABNORMAL LOW (ref 30.0–36.0)
MCV: 89.1 fL (ref 80.0–100.0)
Monocytes Absolute: 1 10*3/uL (ref 0.1–1.0)
Monocytes Relative: 13 %
Neutro Abs: 4.4 10*3/uL (ref 1.7–7.7)
Neutrophils Relative %: 55 %
Platelets: 288 10*3/uL (ref 150–400)
RBC: 4.4 MIL/uL (ref 3.87–5.11)
RDW: 14.7 % (ref 11.5–15.5)
WBC: 7.9 10*3/uL (ref 4.0–10.5)
nRBC: 0 % (ref 0.0–0.2)

## 2020-12-19 LAB — PHOSPHORUS: Phosphorus: 4.6 mg/dL (ref 2.5–4.6)

## 2020-12-19 LAB — COMPREHENSIVE METABOLIC PANEL
ALT: 35 U/L (ref 0–44)
AST: 37 U/L (ref 15–41)
Albumin: 3.1 g/dL — ABNORMAL LOW (ref 3.5–5.0)
Alkaline Phosphatase: 53 U/L (ref 38–126)
Anion gap: 7 (ref 5–15)
BUN: 21 mg/dL — ABNORMAL HIGH (ref 6–20)
CO2: 38 mmol/L — ABNORMAL HIGH (ref 22–32)
Calcium: 8.7 mg/dL — ABNORMAL LOW (ref 8.9–10.3)
Chloride: 91 mmol/L — ABNORMAL LOW (ref 98–111)
Creatinine, Ser: 0.75 mg/dL (ref 0.44–1.00)
GFR, Estimated: >60 mL/min (ref >60)
Glucose, Bld: 132 mg/dL — ABNORMAL HIGH (ref 70–99)
Potassium: 3.3 mmol/L — ABNORMAL LOW (ref 3.5–5.1)
Sodium: 136 mmol/L (ref 135–145)
Total Bilirubin: 0.6 mg/dL (ref 0.3–1.2)
Total Protein: 7.6 g/dL (ref 6.5–8.1)

## 2020-12-19 LAB — MAGNESIUM: Magnesium: 1.8 mg/dL (ref 1.7–2.4)

## 2020-12-19 LAB — PROCALCITONIN: Procalcitonin: <0.1 ng/mL

## 2020-12-19 LAB — LACTIC ACID, PLASMA
Lactic Acid, Venous: 1.8 mmol/L (ref 0.5–1.9)
Lactic Acid, Venous: 2.6 mmol/L (ref 0.5–1.9)

## 2020-12-19 MED ORDER — POTASSIUM CHLORIDE CRYS ER 20 MEQ PO TBCR
50.0000 meq | EXTENDED_RELEASE_TABLET | Freq: Two times a day (BID) | ORAL | Status: AC
  Administered 2020-12-19 (×2): 50 meq via ORAL
  Filled 2020-12-19 (×2): qty 1

## 2020-12-19 MED ORDER — SODIUM CHLORIDE 0.9 % IV SOLN: INTRAVENOUS | Status: DC | PRN

## 2020-12-19 MED ORDER — METHYLPREDNISOLONE SODIUM SUCC 125 MG IJ SOLR
60.0000 mg | Freq: Two times a day (BID) | INTRAMUSCULAR | Status: DC
  Administered 2020-12-19 – 2020-12-21 (×4): 60 mg via INTRAVENOUS
  Filled 2020-12-19 (×4): qty 2

## 2020-12-19 MED ORDER — MAGNESIUM SULFATE 2 GM/50ML IV SOLN
2.0000 g | Freq: Once | INTRAVENOUS | Status: AC
  Administered 2020-12-19: 2 g via INTRAVENOUS
  Filled 2020-12-19: qty 50

## 2020-12-19 NOTE — Progress Notes (Signed)
Inpatient Diabetes Program Recommendations  AACE/ADA: New Consensus Statement on Inpatient Glycemic Control (2015)  Target Ranges:  Prepandial:   less than 140 mg/dL      Peak postprandial:   less than 180 mg/dL (1-2 hours)      Critically ill patients:  140 - 180 mg/dL   Lab Results  Component Value Date   GLUCAP 127 (H) 12/19/2020   HGBA1C 8.8 (H) 12/17/2020    Review of Glycemic Control Results for JADELIN, ENG (MRN [id number]) as of 12/19/2020 11:19  Ref. Range 12/18/2020 08:14 12/18/2020 11:12 12/18/2020 15:38 12/18/2020 20:18 12/18/2020 23:36 12/19/2020 03:58 12/19/2020 08:11  Glucose-Capillary Latest Ref Range: 70 - 99 mg/dL 155 (H) 208 (H) 022 (H) 259 (H) 194 (H) 129 (H) 127 (H)     Inpatient Diabetes Program Recommendations:    Novolog 0-15 units TID and 0-5 QHS Novolog 3 units TID with meals if eats at least 50%  Will continue to follow while inpatient.  Thank you, Dulce Sellar, RN, BSN Diabetes Coordinator Inpatient Diabetes Program [phone] (team pager from 8a-5p)

## 2020-12-19 NOTE — TOC Progression Note (Signed)
Transition of Care [facility]) - Progression Note    Patient Details  Name: Yvette Gibbs MRN: [id number] Date of Birth: [birth year]  Transition of [facility]) CM/SW Contact  Leone Haven, RN Phone Number: 12/19/2020, 3:47 PM  Clinical Narrative:    Per Ian Malkin with [employer] the NIV has been approved. NCM notified MD.         Expected Discharge Plan and Services                                                 Social Determinants of Health (SDOH) Interventions    Readmission Risk Interventions No flowsheet data found.

## 2020-12-19 NOTE — Progress Notes (Addendum)
PROGRESS NOTE    Yvette Gibbs  UXL:[id number] DOB: Oct 05, 1990 DOA: 12/16/2020 PCP: Kerin Perna, NP     Brief Narrative:  Yvette Gibbs is a 30 y.o. BF PMHx HTN, DM type II uncontrolled with complication, t, morbid obesity, and suspect OSA/OHS  Presents with complaints of 3 days of shortness of breath.  History is limited due to patient being currently on BiPAP.  She reports that she has been having a nonproductive cough with subjective fever, chills, and epigastric pain.  Denies having any vomiting or leg swelling.  She tried using her home inhaler without any relief in symptoms.  Last hospitalized in April of this year with acute on chronic hypoxic respiratory failure, but had not been sent for a sleep study.  At that time patient had inpatient spirometry on 4/22 which showed FEV1/FEV ratio of 23%, but did not qualify for. Her grandmother states that over the last few days she had been in significant respiratory distress with wheezing and been sleeping all the time.   ED Course: Upon admission into the emergency department patient was seen to be afebrile, pulse 105-110, respiration 21-22, blood pressures 132/65-170 7/98, and O2 saturations noted to be as low as 63% while asleep with improvement after patient placed on BiPAP.  Initial ABG revealed pH 7.277, PCO2 76, and PO2 91.  Labs significant for hemoglobin 10.9, sodium 134, glucose 229, and BNP 14.9.  Chest x-ray noted mild cardiomegaly with vascular congestion.  She had been given 125 mg of Solu-Medrol IV, multiple breathing treatments, 2 g of magnesium sulfate, and 80 mg of Lasix IV.   Subjective: 7/7 afebrile overnight, A/O x4, negative CP, negative SOB.   Assessment & Plan: Covid vaccination; unvaccinated.  Request vaccination.  7/6 vaccination request placed   Principal Problem:   Acute on chronic respiratory failure with hypoxia and hypercapnia (HCC) Active Problems:   Morbid obesity (HCC)   OSA (obstructive sleep  apnea)   Gastroesophageal reflux disease without esophagitis   Normocytic anemia   Acute asthma exacerbation   Obstructive lung disease (HCC)  OHS/OSA -Continuous pulse ox - BiPAP per respiratory -Albuterol nebulizer as needed - Breo Ellipta 100-25 mcg 1 puff daily - Solu-Medrol 60 mg BID -Incentive spirometry - Flutter valve -7/7 awaiting arrival of Trilogy NIV machine.  Patient understands she will have to stay at least overnight in order to ensure machine is properly calibrated for her.  Sepsis/ Asthma exacerbation - Upon admission patient met criteria for SIRS RR> 20, HR> 100, site of infection lungs -Treat 5 to 7-day of antibiotics - Trend procalcitonin/lactic acid Results for ESTEFANY, GOEBEL (MRN [id number]) as of 12/19/2020 15:56  Ref. Range 12/18/2020 03:23 12/18/2020 08:51 12/19/2020 02:39 12/19/2020 11:34 12/19/2020 14:32  Lactic Acid, Venous Latest Ref Range: 0.5 - 1.9 mmol/L    1.8 2.6 (HH)  Procalcitonin Latest Units: ng/mL    <0.10   -7/7 normal saline 742VZ/DG  Chronic diastolic CHF  -Strict in and out - Daily weight  -CXR :  noted to have some pulmonary vascular congestion.  BNP was within normal limits at 14.9.  Patient had initially been given furosemide 80 mg IV x1 dose.  Last echocardiogram revealed EF of 55 to 60% with normal diastolic heart function in 09/2020.  Essential HTN - Amlodipine 10 mg daily - Irbesartan 300 mg daily - Labetalol IV PRN -Torsemide 40 mg twice daily   DM type II uncontrolled with hyperglycemia - 7/5 Hemoglobin A1c = 8.8  -7/6 increase Lantus 50 units  BID -Moderate SSI - Metformin and Trulicity on hold  Epigastric abdominal pain:  -Chronic. Possibly could be multifactorial in nature given increased risk of GERD and gastroparesis. -7/6 no complaints today - Continue Reglan, Protonix, Carafate, and Pepcid   Normocytic anemia: (Baseline HgB~10.9 g/dL)  Lab Results  Component Value Date   HGB 11.4 (L) 12/19/2020   HGB 10.9 (L)  12/18/2020   HGB 10.7 (L) 12/18/2020   HGB 12.9 12/17/2020   HGB 12.6 12/17/2020  -Better than baseline   GERD -Continue home regimen once off BiPAP   Morbid obesity OSA: (BMI 70.4 kg/m)@@@@ .  During last hospitalization inpatient spirometry had been completed, but patient did not qualify for BiPAP at that time. -Continue CPAP nightly once able to be weaned off BiPAP -7/7 patient has agreed to participate in Bariatric surgery program.  In order for patient to participate in program she requires an in lab sleep study.  Have discussed case with sleep lab manager at [facility], who has arranged appointment with Dr. Rexene Alberts on 7/13@[phone]  [phone] (sleep Dr.) In order to begin process.   Hypokalemia - Potassium goal> 4 - K-Dur 50 mEq BID x2 doses  Hypomagnesmia - Magnesium goal> 2 - Magnesium IV 2 g   DVT prophylaxis: Lovenox Code Status: Full Family Communication:  Status is: Inpatient    Dispo: The patient is from:               Anticipated d/c is to: Home              Anticipated d/c date is: 7/8              Patient currently unstable      Consultants:    Procedures/Significant Events:    I have personally reviewed and interpreted all radiology studies and my findings are as above.  VENTILATOR SETTINGS: BiPAP   Cultures   Antimicrobials: Anti-infectives (From admission, onward)    Start     Ordered Stop   12/17/20 1200  cefTRIAXone (ROCEPHIN) 2 g in sodium chloride 0.9 % 100 mL IVPB        12/17/20 1136           Devices    LINES / TUBES:      Continuous Infusions:  sodium chloride 1,000 mL (12/18/20 1201)   cefTRIAXone (ROCEPHIN)  IV 2 g (12/18/20 1202)     Objective: Vitals:   12/19/20 0405 12/19/20 0444 12/19/20 0801 12/19/20 0819  BP: (!) 138/97   (!) 143/80  Pulse: 84   96  Resp: 12   16  Temp: 97.9 F (36.6 C)   97.8 F (36.6 C)  TempSrc: Oral   Oral  SpO2: 100%  (!) 89% 97%  Weight:  (!) 172.9 kg    Height:         Intake/Output Summary (Last 24 hours) at 12/19/2020 1110 Last data filed at 12/19/2020 1010 Gross per 24 hour  Intake 1303 ml  Output 5650 ml  Net -4347 ml    Filed Weights   12/16/20 2256 12/18/20 0433 12/19/20 0444  Weight: (!) 160 kg (!) 174.6 kg (!) 172.9 kg    Examination:  General: A/O x4, No acute respiratory distress Eyes: negative scleral hemorrhage, negative anisocoria, negative icterus ENT: Negative Runny nose, negative gingival bleeding, Neck:  Negative scars, masses, torticollis, lymphadenopathy, JVD Lungs: Clear to auscultation bilaterally without wheezes or crackles Cardiovascular: Regular rate and rhythm without murmur gallop or rub normal S1 and S2 Abdomen:  MORBIDLY OBESE abdominal pain, nondistended, positive soft, bowel sounds, no rebound, no ascites, no appreciable mass Extremities: No significant cyanosis, clubbing, or edema bilateral lower extremities Skin: Negative rashes, lesions, ulcers Psychiatric:  Negative depression, negative anxiety, negative fatigue, negative mania  Central nervous system:  Cranial nerves II through XII intact, tongue/uvula midline, all extremities muscle strength 5/5, sensation intact throughout, negative dysarthria, negative expressive aphasia, negative receptive aphasia.  .     Data Reviewed: Care during the described time interval was provided by me .  I have reviewed this patient's available data, including medical history, events of note, physical examination, and all test results as part of my evaluation.  CBC: Recent Labs  Lab 12/16/20 2304 12/17/20 0612 12/17/20 1342 12/17/20 1617 12/18/20 0323 12/18/20 0851 12/19/20 0239  WBC 7.5  --   --   --  8.2 8.4 7.9  NEUTROABS 4.3  --   --   --   --  6.3 4.4  HGB 10.9*   < > 12.6 12.9 10.7* 10.9* 11.4*  HCT 36.6   < > 37.0 38.0 37.1 36.9 39.2  MCV 89.9  --   --   --  89.6 89.1 89.1  PLT 267  --   --   --  303 288 288   < > = values in this interval not displayed.     Basic Metabolic Panel: Recent Labs  Lab 12/16/20 2304 12/17/20 0612 12/17/20 1342 12/17/20 1617 12/18/20 0323 12/19/20 0239  NA 134* 137 135 135 134* 136  K 4.1 4.0 4.7 5.1 3.9 3.3*  CL 96*  --   --   --  92* 91*  CO2 31  --   --   --  33* 38*  GLUCOSE 229*  --   --   --  245* 132*  BUN 8  --   --   --  17 21*  CREATININE 0.76  --   --   --  0.83 0.75  CALCIUM 9.0  --   --   --  9.3 8.7*  MG  --   --   --   --   --  1.8  PHOS  --   --   --   --   --  4.6    GFR: Estimated Creatinine Clearance: 164 mL/min (by C-G formula based on SCr of 0.75 mg/dL). Liver Function Tests: Recent Labs  Lab 12/18/20 0323 12/19/20 0239  AST 21 37  ALT 32 35  ALKPHOS 56 53  BILITOT 0.3 0.6  PROT 7.8 7.6  ALBUMIN 3.1* 3.1*    No results for input(s): LIPASE, AMYLASE in the last 168 hours. No results for input(s): AMMONIA in the last 168 hours. Coagulation Profile: No results for input(s): INR, PROTIME in the last 168 hours. Cardiac Enzymes: No results for input(s): CKTOTAL, CKMB, CKMBINDEX, TROPONINI in the last 168 hours. BNP (last 3 results) No results for input(s): PROBNP in the last 8760 hours. HbA1C: Recent Labs    12/17/20 1919  HGBA1C 8.8*    CBG: Recent Labs  Lab 12/18/20 1538 12/18/20 2018 12/18/20 2336 12/19/20 0358 12/19/20 0811  GLUCAP 274* 259* 194* 129* 127*    Lipid Profile: No results for input(s): CHOL, HDL, LDLCALC, TRIG, CHOLHDL, LDLDIRECT in the last 72 hours. Thyroid Function Tests: No results for input(s): TSH, T4TOTAL, FREET4, T3FREE, THYROIDAB in the last 72 hours. Anemia Panel: No results for input(s): VITAMINB12, FOLATE, FERRITIN, TIBC, IRON, RETICCTPCT in the last 72 hours. Sepsis  Labs: No results for input(s): PROCALCITON, LATICACIDVEN in the last 168 hours.  Recent Results (from the past 240 hour(s))  SARS CORONAVIRUS 2 (TAT 6-24 HRS) Nasopharyngeal Nasopharyngeal Swab     Status: None   Collection Time: 12/17/20  6:37 AM    Specimen: Nasopharyngeal Swab  Result Value Ref Range Status   SARS Coronavirus 2 NEGATIVE NEGATIVE Final    Comment: (NOTE) SARS-CoV-2 target nucleic acids are NOT DETECTED.  The SARS-CoV-2 RNA is generally detectable in upper and lower respiratory specimens during the acute phase of infection. Negative results do not preclude SARS-CoV-2 infection, do not rule out co-infections with other pathogens, and should not be used as the sole basis for treatment or other patient management decisions. Negative results must be combined with clinical observations, patient history, and epidemiological information. The expected result is Negative.  Fact Sheet for Patients: SugarRoll.be  Fact Sheet for Healthcare Providers: https://www.Cassidy Tashiro-mathews.com/  This test is not yet approved or cleared by the [place] FDA and  has been authorized for detection and/or diagnosis of SARS-CoV-2 by FDA under an Emergency Use Authorization (EUA). This EUA will remain  in effect (meaning this test can be used) for the duration of the COVID-19 declaration under Se ction 564(b)(1) of the Act, 21 U.S.C. section 360bbb-3(b)(1), unless the authorization is terminated or revoked sooner.  Performed at [facility], [place] [address], [place], [place] [address]           Radiology Studies: No results found.      Scheduled Meds:  amLODipine  10 mg Oral Daily   COVID-19 mRNA Vac-TriS (Pfizer)  0.3 mL Intramuscular Once   enoxaparin (LOVENOX) injection  80 mg Subcutaneous Q24H   famotidine  20 mg Oral BID   fluticasone  1 spray Each Nare Daily   fluticasone furoate-vilanterol  1 puff Inhalation Daily   insulin aspart  0-15 Units Subcutaneous Q4H   insulin glargine  50 Units Subcutaneous BID   irbesartan  300 mg Oral Daily   methylPREDNISolone (SOLU-MEDROL) injection  60 mg Intravenous Q12H   metoCLOPramide  5 mg Oral TID AC   pantoprazole  40 mg Oral  Daily   sodium chloride flush  3 mL Intravenous Q12H   sucralfate  1 g Oral TID WC & HS   torsemide  40 mg Oral BID   Continuous Infusions:  sodium chloride 1,000 mL (12/18/20 1201)   cefTRIAXone (ROCEPHIN)  IV 2 g (12/18/20 1202)     LOS: 2 days    Time spent:40 min    Rajvi Armentor, Geraldo Docker, MD [facility]   If 7PM-7AM, please contact night-coverage 12/19/2020, 11:10 AM

## 2020-12-20 ENCOUNTER — Telehealth: Payer: Self-pay | Admitting: Pulmonary Disease

## 2020-12-20 DIAGNOSIS — E1165 Type 2 diabetes mellitus with hyperglycemia: Secondary | ICD-10-CM | POA: Diagnosis not present

## 2020-12-20 DIAGNOSIS — D649 Anemia, unspecified: Secondary | ICD-10-CM | POA: Diagnosis not present

## 2020-12-20 DIAGNOSIS — K219 Gastro-esophageal reflux disease without esophagitis: Secondary | ICD-10-CM | POA: Diagnosis not present

## 2020-12-20 DIAGNOSIS — G4733 Obstructive sleep apnea (adult) (pediatric): Secondary | ICD-10-CM | POA: Diagnosis not present

## 2020-12-20 DIAGNOSIS — J4552 Severe persistent asthma with status asthmaticus: Secondary | ICD-10-CM | POA: Diagnosis not present

## 2020-12-20 DIAGNOSIS — J9601 Acute respiratory failure with hypoxia: Secondary | ICD-10-CM | POA: Diagnosis not present

## 2020-12-20 DIAGNOSIS — J45901 Unspecified asthma with (acute) exacerbation: Secondary | ICD-10-CM | POA: Diagnosis not present

## 2020-12-20 DIAGNOSIS — J9621 Acute and chronic respiratory failure with hypoxia: Secondary | ICD-10-CM | POA: Diagnosis not present

## 2020-12-20 DIAGNOSIS — J9622 Acute and chronic respiratory failure with hypercapnia: Secondary | ICD-10-CM | POA: Diagnosis not present

## 2020-12-20 DIAGNOSIS — J4541 Moderate persistent asthma with (acute) exacerbation: Secondary | ICD-10-CM | POA: Diagnosis not present

## 2020-12-20 DIAGNOSIS — J449 Chronic obstructive pulmonary disease, unspecified: Secondary | ICD-10-CM | POA: Diagnosis not present

## 2020-12-20 DIAGNOSIS — E662 Morbid (severe) obesity with alveolar hypoventilation: Secondary | ICD-10-CM | POA: Diagnosis not present

## 2020-12-20 LAB — COMPREHENSIVE METABOLIC PANEL
ALT: 37 U/L (ref 0–44)
AST: 25 U/L (ref 15–41)
Albumin: 3.3 g/dL — ABNORMAL LOW (ref 3.5–5.0)
Alkaline Phosphatase: 65 U/L (ref 38–126)
Anion gap: 12 (ref 5–15)
BUN: 23 mg/dL — ABNORMAL HIGH (ref 6–20)
CO2: 34 mmol/L — ABNORMAL HIGH (ref 22–32)
Calcium: 9.5 mg/dL (ref 8.9–10.3)
Chloride: 89 mmol/L — ABNORMAL LOW (ref 98–111)
Creatinine, Ser: 0.79 mg/dL (ref 0.44–1.00)
GFR, Estimated: >60 mL/min (ref >60)
Glucose, Bld: 229 mg/dL — ABNORMAL HIGH (ref 70–99)
Potassium: 4.5 mmol/L (ref 3.5–5.1)
Sodium: 135 mmol/L (ref 135–145)
Total Bilirubin: 0.7 mg/dL (ref 0.3–1.2)
Total Protein: 8 g/dL (ref 6.5–8.1)

## 2020-12-20 LAB — CBC WITH DIFFERENTIAL/PLATELET
Abs Immature Granulocytes: 0.04 10*3/uL (ref 0.00–0.07)
Basophils Absolute: 0 10*3/uL (ref 0.0–0.1)
Basophils Relative: 0 %
Eosinophils Absolute: 0 10*3/uL (ref 0.0–0.5)
Eosinophils Relative: 0 %
HCT: 42.8 % (ref 36.0–46.0)
Hemoglobin: 12.9 g/dL (ref 12.0–15.0)
Immature Granulocytes: 0 %
Lymphocytes Relative: 10 %
Lymphs Abs: 0.9 10*3/uL (ref 0.7–4.0)
MCH: 26.1 pg (ref 26.0–34.0)
MCHC: 30.1 g/dL (ref 30.0–36.0)
MCV: 86.6 fL (ref 80.0–100.0)
Monocytes Absolute: 0.3 10*3/uL (ref 0.1–1.0)
Monocytes Relative: 3 %
Neutro Abs: 7.9 10*3/uL — ABNORMAL HIGH (ref 1.7–7.7)
Neutrophils Relative %: 87 %
Platelets: 326 10*3/uL (ref 150–400)
RBC: 4.94 MIL/uL (ref 3.87–5.11)
RDW: 14.4 % (ref 11.5–15.5)
WBC: 9.1 10*3/uL (ref 4.0–10.5)
nRBC: 0 % (ref 0.0–0.2)

## 2020-12-20 LAB — GLUCOSE, CAPILLARY
Glucose-Capillary: 220 mg/dL — ABNORMAL HIGH (ref 70–99)
Glucose-Capillary: 270 mg/dL — ABNORMAL HIGH (ref 70–99)
Glucose-Capillary: 280 mg/dL — ABNORMAL HIGH (ref 70–99)
Glucose-Capillary: 319 mg/dL — ABNORMAL HIGH (ref 70–99)
Glucose-Capillary: 324 mg/dL — ABNORMAL HIGH (ref 70–99)
Glucose-Capillary: 334 mg/dL — ABNORMAL HIGH (ref 70–99)

## 2020-12-20 LAB — MAGNESIUM: Magnesium: 2.2 mg/dL (ref 1.7–2.4)

## 2020-12-20 LAB — PHOSPHORUS: Phosphorus: 3.4 mg/dL (ref 2.5–4.6)

## 2020-12-20 LAB — PROCALCITONIN: Procalcitonin: <0.1 ng/mL

## 2020-12-20 MED ORDER — CYCLOBENZAPRINE HCL 5 MG PO TABS
5.0000 mg | ORAL_TABLET | Freq: Three times a day (TID) | ORAL | Status: DC | PRN
  Administered 2020-12-20: 5 mg via ORAL
  Filled 2020-12-20 (×2): qty 1

## 2020-12-20 MED ORDER — CYCLOBENZAPRINE HCL 5 MG PO TABS
5.0000 mg | ORAL_TABLET | Freq: Once | ORAL | Status: AC
  Administered 2020-12-20: 5 mg via ORAL
  Filled 2020-12-20: qty 1

## 2020-12-20 MED ORDER — INSULIN ASPART 100 UNIT/ML IJ SOLN
10.0000 [IU] | Freq: Three times a day (TID) | INTRAMUSCULAR | Status: DC
  Administered 2020-12-20 – 2020-12-21 (×4): 10 [IU] via SUBCUTANEOUS

## 2020-12-20 NOTE — Progress Notes (Signed)
Patient home BIPAP unit setup and helped patient get machine on. V60 hospital unit left in room at this time in case patient doesn't tolerate home machine well. Plan is to dc tomorrow as long as patient does well on her own machine tonight.

## 2020-12-20 NOTE — Progress Notes (Signed)
PROGRESS NOTE    Yvette Gibbs  LZJ:[id number] DOB: 01-Dec-1990 DOA: 12/16/2020 PCP: Kerin Perna, NP     Brief Narrative:  Yvette Gibbs is a 30 y.o. BF PMHx HTN, DM type II uncontrolled with complication, t, morbid obesity, and suspect OSA/OHS  Presents with complaints of 3 days of shortness of breath.  History is limited due to patient being currently on BiPAP.  She reports that she has been having a nonproductive cough with subjective fever, chills, and epigastric pain.  Denies having any vomiting or leg swelling.  She tried using her home inhaler without any relief in symptoms.  Last hospitalized in April of this year with acute on chronic hypoxic respiratory failure, but had not been sent for a sleep study.  At that time patient had inpatient spirometry on 4/22 which showed FEV1/FEV ratio of 23%, but did not qualify for. Her grandmother states that over the last few days she had been in significant respiratory distress with wheezing and been sleeping all the time.   ED Course: Upon admission into the emergency department patient was seen to be afebrile, pulse 105-110, respiration 21-22, blood pressures 132/65-170 7/98, and O2 saturations noted to be as low as 63% while asleep with improvement after patient placed on BiPAP.  Initial ABG revealed pH 7.277, PCO2 76, and PO2 91.  Labs significant for hemoglobin 10.9, sodium 134, glucose 229, and BNP 14.9.  Chest x-ray noted mild cardiomegaly with vascular congestion.  She had been given 125 mg of Solu-Medrol IV, multiple breathing treatments, 2 g of magnesium sulfate, and 80 mg of Lasix IV.   Subjective: 7/8 afebrile overnight negative CP, negative SOB.  Patient lying in bed comfortably with [facility] representative giving her instructions on how to use machine.   Assessment & Plan: Covid vaccination; unvaccinated.  Request vaccination.  7/6 vaccination request placed   Principal Problem:   Acute on chronic respiratory failure with  hypoxia and hypercapnia (HCC) Active Problems:   Morbid obesity (HCC)   OSA (obstructive sleep apnea)   Gastroesophageal reflux disease without esophagitis   Normocytic anemia   Acute asthma exacerbation   Obstructive lung disease (HCC)  OHS/OSA -Continuous pulse ox - BiPAP per respiratory -Albuterol nebulizer as needed - Breo Ellipta 100-25 mcg 1 puff daily - Solu-Medrol 60 mg BID -Incentive spirometry - Flutter valve -7/7 awaiting arrival of [facility] NIV machine.  Patient understands she will have to stay at least overnight in order to ensure machine is properly calibrated for her. -7/8 follow-up with Yvette Gibbs  PCCM on Wednesday 13 July @ 1030  Sepsis/ Asthma exacerbation - Upon admission patient met criteria for SIRS RR> 20, HR> 100, site of infection lungs -Treat 5 to 7-day of antibiotics - Trend procalcitonin/lactic acid Results for Yvette Gibbs, Yvette Gibbs (MRN [id number]) as of 12/19/2020 15:56  Ref. Range 12/18/2020 03:23 12/18/2020 08:51 12/19/2020 02:39 12/19/2020 11:34 12/19/2020 14:32  Lactic Acid, Venous Latest Ref Range: 0.5 - 1.9 mmol/L    1.8 2.6 (HH)  Procalcitonin Latest Units: ng/mL    <0.10   -7/7 normal saline 299ME/QA  Chronic diastolic CHF  -Strict in and out - Daily weight  -CXR :  noted to have some pulmonary vascular congestion.  BNP was within normal limits at 14.9.  Patient had initially been given furosemide 80 mg IV x1 dose.  Last echocardiogram revealed EF of 55 to 60% with normal diastolic heart function in 09/2020.  Essential HTN - Amlodipine 10 mg daily - Irbesartan 300 mg daily -  Labetalol IV PRN -Torsemide 40 mg twice daily   DM type II uncontrolled with hyperglycemia - 7/5 Hemoglobin A1c = 8.8  -7/6 increase Lantus 50 units BID -7/8 NovoLog 10 units qac -Moderate SSI - Metformin and Trulicity on hold  Epigastric abdominal pain:  -Chronic. Possibly could be multifactorial in nature given increased risk of GERD and gastroparesis. -7/6 no complaints  today - Continue Reglan, Protonix, Carafate, and Pepcid   Normocytic anemia: (Baseline HgB~10.9 g/dL)  Lab Results  Component Value Date   HGB 12.9 12/20/2020   HGB 11.4 (L) 12/19/2020   HGB 10.9 (L) 12/18/2020   HGB 10.7 (L) 12/18/2020   HGB 12.9 12/17/2020  -Better than baseline   GERD -Continue home regimen once off BiPAP   Morbid obesity OSA: (BMI 70.4 kg/m)@@@@ .  During last hospitalization inpatient spirometry had been completed, but patient did not qualify for BiPAP at that time. -Continue CPAP nightly once able to be weaned off BiPAP -7/7 patient has agreed to participate in Bariatric surgery program.  In order for patient to participate in program she requires an in lab sleep study.  Have discussed case with sleep lab manager at [facility], who has arranged appointment with Yvette Gibbs on 7/13@0  900 (sleep Dr.) In order to begin process. -7/8 spoke at length with RN Yvette Gibbs who is familiar with bariatric surgery program.  Is going to stop by and instruct patient on updated protocol for enrolling in program  Hypokalemia - Potassium goal> 4  Hypomagnesmia - Magnesium goal> 2    DVT prophylaxis: Lovenox Code Status: Full Family Communication:  Status is: Inpatient    Dispo: The patient is from:               Anticipated d/c is to: Home              Anticipated d/c date is: 7/9              Patient currently unstable      Consultants:  [facility]  Procedures/Significant Events:    I have personally reviewed and interpreted all radiology studies and my findings are as above.  VENTILATOR SETTINGS: BiPAP   Cultures   Antimicrobials: Anti-infectives (From admission, onward)    Start     Ordered Stop   12/17/20 1200  cefTRIAXone (ROCEPHIN) 2 g in sodium chloride 0.9 % 100 mL IVPB        12/17/20 1136           Devices    LINES / TUBES:      Continuous Infusions:  sodium chloride     cefTRIAXone (ROCEPHIN)  IV Stopped  (12/19/20 1218)     Objective: Vitals:   12/19/20 2359 12/20/20 0328 12/20/20 0419 12/20/20 0742  BP: (!) 142/84  130/88 133/84  Pulse: 88 82 84 94  Resp: 15 14 16 20   Temp:   98.2 F (36.8 C) 98.4 F (36.9 C)  TempSrc:   Oral Oral  SpO2: 100% 98% 96% 100%  Weight:   (!) 171.2 kg   Height:        Intake/Output Summary (Last 24 hours) at 12/20/2020 0847 Last data filed at 12/20/2020 0748 Gross per 24 hour  Intake 2010 ml  Output 2751 ml  Net -741 ml    Filed Weights   12/18/20 0433 12/19/20 0444 12/20/20 0419  Weight: (!) 174.6 kg (!) 172.9 kg (!) 171.2 kg    Examination:  General: A/O x4, No acute respiratory distress Eyes:  negative scleral hemorrhage, negative anisocoria, negative icterus ENT: Negative Runny nose, negative gingival bleeding, Neck:  Negative scars, masses, torticollis, lymphadenopathy, JVD Lungs: Clear to auscultation bilaterally without wheezes or crackles Cardiovascular: Regular rate and rhythm without murmur gallop or rub normal S1 and S2 Abdomen: MORBIDLY OBESE abdominal pain, nondistended, positive soft, bowel sounds, no rebound, no ascites, no appreciable mass Extremities: No significant cyanosis, clubbing, or edema bilateral lower extremities Skin: Negative rashes, lesions, ulcers Psychiatric:  Negative depression, negative anxiety, negative fatigue, negative mania  Central nervous system:  Cranial nerves II through XII intact, tongue/uvula midline, all extremities muscle strength 5/5, sensation intact throughout, negative dysarthria, negative expressive aphasia, negative receptive aphasia.  .     Data Reviewed: Care during the described time interval was provided by me .  I have reviewed this patient's available data, including medical history, events of note, physical examination, and all test results as part of my evaluation.  CBC: Recent Labs  Lab 12/16/20 2304 12/17/20 0612 12/17/20 1617 12/18/20 0323 12/18/20 0851 12/19/20 0239  12/20/20 0436  WBC 7.5  --   --  8.2 8.4 7.9 9.1  NEUTROABS 4.3  --   --   --  6.3 4.4 7.9*  HGB 10.9*   < > 12.9 10.7* 10.9* 11.4* 12.9  HCT 36.6   < > 38.0 37.1 36.9 39.2 42.8  MCV 89.9  --   --  89.6 89.1 89.1 86.6  PLT 267  --   --  303 288 288 326   < > = values in this interval not displayed.    Basic Metabolic Panel: Recent Labs  Lab 12/16/20 2304 12/17/20 0612 12/17/20 1342 12/17/20 1617 12/18/20 0323 12/19/20 0239 12/20/20 0436  NA 134*   < > 135 135 134* 136 135  K 4.1   < > 4.7 5.1 3.9 3.3* 4.5  CL 96*  --   --   --  92* 91* 89*  CO2 31  --   --   --  33* 38* 34*  GLUCOSE 229*  --   --   --  245* 132* 229*  BUN 8  --   --   --  17 21* 23*  CREATININE 0.76  --   --   --  0.83 0.75 0.79  CALCIUM 9.0  --   --   --  9.3 8.7* 9.5  MG  --   --   --   --   --  1.8 2.2  PHOS  --   --   --   --   --  4.6 3.4   < > = values in this interval not displayed.    GFR: Estimated Creatinine Clearance: 162.8 mL/min (by C-G formula based on SCr of 0.79 mg/dL). Liver Function Tests: Recent Labs  Lab 12/18/20 0323 12/19/20 0239 12/20/20 0436  AST 21 37 25  ALT 32 35 37  ALKPHOS 56 53 65  BILITOT 0.3 0.6 0.7  PROT 7.8 7.6 8.0  ALBUMIN 3.1* 3.1* 3.3*    No results for input(s): LIPASE, AMYLASE in the last 168 hours. No results for input(s): AMMONIA in the last 168 hours. Coagulation Profile: No results for input(s): INR, PROTIME in the last 168 hours. Cardiac Enzymes: No results for input(s): CKTOTAL, CKMB, CKMBINDEX, TROPONINI in the last 168 hours. BNP (last 3 results) No results for input(s): PROBNP in the last 8760 hours. HbA1C: Recent Labs    12/17/20 1919  HGBA1C 8.8*    CBG: Recent  Labs  Lab 12/19/20 1624 12/19/20 1958 12/19/20 2359 12/20/20 0421 12/20/20 0742  GLUCAP 275* 395* 270* 220* 280*    Lipid Profile: No results for input(s): CHOL, HDL, LDLCALC, TRIG, CHOLHDL, LDLDIRECT in the last 72 hours. Thyroid Function Tests: No results for  input(s): TSH, T4TOTAL, FREET4, T3FREE, THYROIDAB in the last 72 hours. Anemia Panel: No results for input(s): VITAMINB12, FOLATE, FERRITIN, TIBC, IRON, RETICCTPCT in the last 72 hours. Sepsis Labs: Recent Labs  Lab 12/19/20 1134 12/19/20 1432 12/20/20 0436  PROCALCITON <0.10  --  <0.10  LATICACIDVEN 1.8 2.6*  --     Recent Results (from the past 240 hour(s))  SARS CORONAVIRUS 2 (TAT 6-24 HRS) Nasopharyngeal Nasopharyngeal Swab     Status: None   Collection Time: 12/17/20  6:37 AM   Specimen: Nasopharyngeal Swab  Result Value Ref Range Status   SARS Coronavirus 2 NEGATIVE NEGATIVE Final    Comment: (NOTE) SARS-CoV-2 target nucleic acids are NOT DETECTED.  The SARS-CoV-2 RNA is generally detectable in upper and lower respiratory specimens during the acute phase of infection. Negative results do not preclude SARS-CoV-2 infection, do not rule out co-infections with other pathogens, and should not be used as the sole basis for treatment or other patient management decisions. Negative results must be combined with clinical observations, patient history, and epidemiological information. The expected result is Negative.  Fact Sheet for Patients: SugarRoll.be  Fact Sheet for Healthcare Providers: https://www.Mariha Sleeper-mathews.com/  This test is not yet approved or cleared by the [place] FDA and  has been authorized for detection and/or diagnosis of SARS-CoV-2 by FDA under an Emergency Use Authorization (EUA). This EUA will remain  in effect (meaning this test can be used) for the duration of the COVID-19 declaration under Se ction 564(b)(1) of the Act, 21 U.S.C. section 360bbb-3(b)(1), unless the authorization is terminated or revoked sooner.  Performed at [facility], [place] [address], [place], [place] [address]           Radiology Studies: No results found.      Scheduled Meds:  amLODipine  10 mg Oral Daily    COVID-19 mRNA Vac-TriS (Pfizer)  0.3 mL Intramuscular Once   enoxaparin (LOVENOX) injection  80 mg Subcutaneous Q24H   famotidine  20 mg Oral BID   fluticasone  1 spray Each Nare Daily   fluticasone furoate-vilanterol  1 puff Inhalation Daily   insulin aspart  0-15 Units Subcutaneous Q4H   insulin glargine  50 Units Subcutaneous BID   irbesartan  300 mg Oral Daily   methylPREDNISolone (SOLU-MEDROL) injection  60 mg Intravenous Q12H   metoCLOPramide  5 mg Oral TID AC   pantoprazole  40 mg Oral Daily   sodium chloride flush  3 mL Intravenous Q12H   sucralfate  1 g Oral TID WC & HS   torsemide  40 mg Oral BID   Continuous Infusions:  sodium chloride     cefTRIAXone (ROCEPHIN)  IV Stopped (12/19/20 1218)     LOS: 3 days    Time spent:40 min    Letonya Mangels, Geraldo Docker, MD [facility]   If 7PM-7AM, please contact night-coverage 12/20/2020, 8:47 AM

## 2020-12-20 NOTE — Telephone Encounter (Signed)
Appt has been scheduled for pt with Dr. Craige Cotta on Wed. 7/13 at 10:30. Note was placed that this appt was okayed to be double-booked per VS. Since pt is still in the hospital, once pt is discharged, her appt info will show up on the AVS.  Note was sent to Dr. Craige Cotta as an FYI about the appt. Nothing further needed.

## 2020-12-20 NOTE — Progress Notes (Signed)
Physical Therapy Treatment Patient Details Name: Yvette Gibbs MRN: [id number] DOB: 10/06/1990 Today's Date: 12/20/2020    History of Present Illness 30 y.o. femal presents to [facility] ED on 12/16/2020 with SOB. In ED patient desaturating to 60s when sleeping. Pt admitted for management of hypoxic and hypercapnic respiratory failure with asthma exacerbation and severe obstructive lung disease. PMH significant for HTN, DMII, OSA, morbid obesity.    PT Comments    Pt mobilizing at a mod I level for increased time only, no LOB or unsteadiness noted. Pt maintained SpO2 95-96% on RA during gait with dyspnea on exertion reported or observed. PT to continue to follow while to ensure endurance progression and to maintain strength, but doing very well and PT encourages RN staff to mobilize pt during the day.    Follow Up Recommendations  No PT follow up     Equipment Recommendations  None recommended by PT    Recommendations for Other Services       Precautions / Restrictions Precautions Precautions: Other (comment) Precaution Comments: monitor SpO2 Restrictions Weight Bearing Restrictions: No    Mobility  Bed Mobility Overal bed mobility: Independent                  Transfers Overall transfer level: Modified independent               General transfer comment: mod I for increased time only  Ambulation/Gait Ambulation/Gait assistance: Modified independent (Device/Increase time) Gait Distance (Feet): 170 Feet Assistive device: None Gait Pattern/deviations: Step-through pattern;WFL(Within Functional Limits) Gait velocity: decr   General Gait Details: mod I for increased time, but no evidence of unsteadiness. SPo2 95-96% on RA, HR 110s during gait   Stairs             Wheelchair Mobility    Modified Rankin (Stroke Patients Only)       Balance Overall balance assessment: Mild deficits observed, not formally tested                                           Cognition Arousal/Alertness: Awake/alert Behavior During Therapy: WFL for tasks assessed/performed Overall Cognitive Status: Within Functional Limits for tasks assessed                                        Exercises      General Comments General comments (skin integrity, edema, etc.): RA, SpO2 95-96%      Pertinent Vitals/Pain Pain Assessment: No/denies pain Faces Pain Scale: No hurt    Home Living                      Prior Function            PT Goals (current goals can now be found in the care plan section) Acute Rehab PT Goals PT Goal Formulation: With patient Time For Goal Achievement: 01/01/21 Potential to Achieve Goals: Good Progress towards PT goals: Progressing toward goals    Frequency    Min 3X/week      PT Plan Current plan remains appropriate    Co-evaluation              AM-PAC PT "6 Clicks" Mobility   Outcome Measure  Help needed turning from your back to your side while  in a flat bed without using bedrails?: None Help needed moving from lying on your back to sitting on the side of a flat bed without using bedrails?: None Help needed moving to and from a bed to a chair (including a wheelchair)?: None Help needed standing up from a chair using your arms (e.g., wheelchair or bedside chair)?: None Help needed to walk in hospital room?: None Help needed climbing 3-5 steps with a railing? : A Little 6 Click Score: 23    End of Session Equipment Utilized During Treatment: Oxygen Activity Tolerance: Patient tolerated treatment well Patient left: in bed;with call bell/phone within reach Nurse Communication: Mobility status PT Visit Diagnosis: Other abnormalities of gait and mobility (R26.89)     Time: 1240-1306 PT Time Calculation (min) (ACUTE ONLY): 26 min  Charges:  $Gait Training: 8-22 mins                     Marye Round, PT DPT Acute Rehabilitation Services Pager [phone]  Office  [phone]    Tyrone Apple E Christain [place] 12/20/2020, 1:36 PM

## 2020-12-20 NOTE — Progress Notes (Signed)
Patient complains of muscle spasms, says she has this at home and cannot recall the medication she takes at home.  Flexeril 5mg  PO once ordered per Dr .

## 2020-12-20 NOTE — TOC Progression Note (Signed)
Transition of Care [facility]) - Progression Note    Patient Details  Name: Yvette Gibbs MRN: [id number] Date of Birth: January 23, 1991  [facility]) CM/SW Contact  Leone Haven, RN Phone Number: 12/20/2020, 2:00 PM  Clinical Narrative:    NIV will be set up today with settings by [employer] at 3 pm per [facility] with [employer].        Expected Discharge Plan and Services                                                 Social Determinants of Health (SDOH) Interventions    Readmission Risk Interventions No flowsheet data found.

## 2020-12-21 DIAGNOSIS — J449 Chronic obstructive pulmonary disease, unspecified: Secondary | ICD-10-CM | POA: Diagnosis not present

## 2020-12-21 DIAGNOSIS — D649 Anemia, unspecified: Secondary | ICD-10-CM | POA: Diagnosis not present

## 2020-12-21 DIAGNOSIS — J9621 Acute and chronic respiratory failure with hypoxia: Secondary | ICD-10-CM | POA: Diagnosis not present

## 2020-12-21 DIAGNOSIS — G4733 Obstructive sleep apnea (adult) (pediatric): Secondary | ICD-10-CM | POA: Diagnosis not present

## 2020-12-21 DIAGNOSIS — K219 Gastro-esophageal reflux disease without esophagitis: Secondary | ICD-10-CM | POA: Diagnosis not present

## 2020-12-21 DIAGNOSIS — E662 Morbid (severe) obesity with alveolar hypoventilation: Secondary | ICD-10-CM | POA: Diagnosis not present

## 2020-12-21 DIAGNOSIS — E118 Type 2 diabetes mellitus with unspecified complications: Secondary | ICD-10-CM

## 2020-12-21 DIAGNOSIS — E1165 Type 2 diabetes mellitus with hyperglycemia: Secondary | ICD-10-CM | POA: Diagnosis not present

## 2020-12-21 DIAGNOSIS — J4541 Moderate persistent asthma with (acute) exacerbation: Secondary | ICD-10-CM | POA: Diagnosis not present

## 2020-12-21 DIAGNOSIS — J9601 Acute respiratory failure with hypoxia: Secondary | ICD-10-CM | POA: Diagnosis not present

## 2020-12-21 DIAGNOSIS — J4552 Severe persistent asthma with status asthmaticus: Secondary | ICD-10-CM | POA: Diagnosis not present

## 2020-12-21 DIAGNOSIS — J45901 Unspecified asthma with (acute) exacerbation: Secondary | ICD-10-CM | POA: Diagnosis not present

## 2020-12-21 DIAGNOSIS — J9622 Acute and chronic respiratory failure with hypercapnia: Secondary | ICD-10-CM | POA: Diagnosis not present

## 2020-12-21 LAB — GLUCOSE, CAPILLARY
Glucose-Capillary: 227 mg/dL — ABNORMAL HIGH (ref 70–99)
Glucose-Capillary: 232 mg/dL — ABNORMAL HIGH (ref 70–99)
Glucose-Capillary: 267 mg/dL — ABNORMAL HIGH (ref 70–99)
Glucose-Capillary: 281 mg/dL — ABNORMAL HIGH (ref 70–99)

## 2020-12-21 LAB — PHOSPHORUS: Phosphorus: 4.1 mg/dL (ref 2.5–4.6)

## 2020-12-21 LAB — COMPREHENSIVE METABOLIC PANEL
ALT: 29 U/L (ref 0–44)
AST: 17 U/L (ref 15–41)
Albumin: 3.2 g/dL — ABNORMAL LOW (ref 3.5–5.0)
Alkaline Phosphatase: 57 U/L (ref 38–126)
Anion gap: 7 (ref 5–15)
BUN: 32 mg/dL — ABNORMAL HIGH (ref 6–20)
CO2: 36 mmol/L — ABNORMAL HIGH (ref 22–32)
Calcium: 9.4 mg/dL (ref 8.9–10.3)
Chloride: 91 mmol/L — ABNORMAL LOW (ref 98–111)
Creatinine, Ser: 0.94 mg/dL (ref 0.44–1.00)
GFR, Estimated: >60 mL/min (ref >60)
Glucose, Bld: 214 mg/dL — ABNORMAL HIGH (ref 70–99)
Potassium: 3.5 mmol/L (ref 3.5–5.1)
Sodium: 134 mmol/L — ABNORMAL LOW (ref 135–145)
Total Bilirubin: 0.5 mg/dL (ref 0.3–1.2)
Total Protein: 7.7 g/dL (ref 6.5–8.1)

## 2020-12-21 LAB — CBC WITH DIFFERENTIAL/PLATELET
Abs Immature Granulocytes: 0.07 10*3/uL (ref 0.00–0.07)
Basophils Absolute: 0 10*3/uL (ref 0.0–0.1)
Basophils Relative: 0 %
Eosinophils Absolute: 0 10*3/uL (ref 0.0–0.5)
Eosinophils Relative: 0 %
HCT: 41.6 % (ref 36.0–46.0)
Hemoglobin: 12.4 g/dL (ref 12.0–15.0)
Immature Granulocytes: 1 %
Lymphocytes Relative: 15 %
Lymphs Abs: 1.8 10*3/uL (ref 0.7–4.0)
MCH: 25.4 pg — ABNORMAL LOW (ref 26.0–34.0)
MCHC: 29.8 g/dL — ABNORMAL LOW (ref 30.0–36.0)
MCV: 85.2 fL (ref 80.0–100.0)
Monocytes Absolute: 1.3 10*3/uL — ABNORMAL HIGH (ref 0.1–1.0)
Monocytes Relative: 11 %
Neutro Abs: 8.7 10*3/uL — ABNORMAL HIGH (ref 1.7–7.7)
Neutrophils Relative %: 73 %
Platelets: 353 10*3/uL (ref 150–400)
RBC: 4.88 MIL/uL (ref 3.87–5.11)
RDW: 14.6 % (ref 11.5–15.5)
WBC: 11.9 10*3/uL — ABNORMAL HIGH (ref 4.0–10.5)
nRBC: 0 % (ref 0.0–0.2)

## 2020-12-21 LAB — PROCALCITONIN: Procalcitonin: <0.1 ng/mL

## 2020-12-21 LAB — MAGNESIUM: Magnesium: 2.2 mg/dL (ref 1.7–2.4)

## 2020-12-21 MED ORDER — INSULIN ASPART 100 UNIT/ML FLEXPEN: 10.0000 [IU] | PEN_INJECTOR | Freq: Three times a day (TID) | SUBCUTANEOUS | 11 refills | Status: DC

## 2020-12-21 MED ORDER — PEN NEEDLES 1/2" 29G X 12MM MISC
0 refills | Status: DC
Start: 2020-12-21 — End: 2021-02-20

## 2020-12-21 MED ORDER — CYCLOBENZAPRINE HCL 5 MG PO TABS: 5.0000 mg | ORAL_TABLET | Freq: Three times a day (TID) | ORAL | 0 refills | Status: DC | PRN

## 2020-12-21 MED ORDER — PREDNISONE 20 MG PO TABS: 40.0000 mg | ORAL_TABLET | Freq: Every day | ORAL | Status: DC

## 2020-12-21 MED ORDER — POTASSIUM CHLORIDE CRYS ER 20 MEQ PO TBCR
50.0000 meq | EXTENDED_RELEASE_TABLET | Freq: Once | ORAL | Status: AC
  Administered 2020-12-21: 50 meq via ORAL
  Filled 2020-12-21: qty 1

## 2020-12-21 NOTE — TOC Transition Note (Signed)
Transition of Care [facility]) - CM/SW Discharge Note   Patient Details  Name: Yvette Gibbs MRN: [id number] Date of Birth: 27-May-1991  Transition of Care [facility]) CM/SW Contact:  Leone Haven, RN Phone Number: 12/21/2020, 12:08 PM   Clinical Narrative:    Patient is for dc today, [employer] supplied the NIV yesterday and she used it overnight, did well and being dc today.   Final next level of care: Home/Self Care Barriers to Discharge: No Barriers Identified   Patient Goals and CMS Choice Patient states their goals for this hospitalization and ongoing recovery are:: return home   Choice offered to / list presented to : NA  Discharge Placement                       Discharge Plan and Services   Discharge Planning Services: CM Consult            DME Arranged: NIV DME Agency: [employer] Date DME Agency Contacted: 12/18/20 Time DME Agency Contacted: [phone] Representative spoke with at DME Agency: Ian Malkin HH Arranged: NA          Social Determinants of Health (SDOH) Interventions     Readmission Risk Interventions Readmission Risk Prevention Plan 12/21/2020  Transportation Screening Complete  Medication Review Oceanographer) Complete  HRI or Home Care Consult Complete  SW Recovery Care/Counseling Consult Complete  Palliative Care Screening Not Applicable  Skilled Nursing Facility Not Applicable  Some recent data might be hidden

## 2020-12-21 NOTE — TOC Initial Note (Signed)
Transition of Care [facility]) - Initial/Assessment Note    Patient Details  Name: Yvette Gibbs MRN: [id number] Date of Birth: 12-20-1990  Transition of Care [facility]) CM/SW Contact:    Leone Haven, RN Phone Number: 12/21/2020, 12:07 PM  Clinical Narrative:                 Patient is for dc today, [employer] supplied the NIV yesterday and she used it overnight, did well and being dc today.   Expected Discharge Plan: Home/Self Care Barriers to Discharge: No Barriers Identified   Patient Goals and CMS Choice     Choice offered to / list presented to : NA  Expected Discharge Plan and Services Expected Discharge Plan: Home/Self Care   Discharge Planning Services: CM Consult     Expected Discharge Date: 12/21/20               DME Arranged: NIV DME Agency: [employer] Date DME Agency Contacted: 12/18/20 Time DME Agency Contacted: [phone] Representative spoke with at DME Agency: Ian Malkin HH Arranged: NA          Prior Living Arrangements/Services   Lives with:: Relatives Patient language and need for interpreter reviewed:: Yes        Need for Family Participation in Patient Care: Yes (Comment) Care giver support system in place?: Yes (comment)   Criminal Activity/Legal Involvement Pertinent to Current Situation/Hospitalization: No - Comment as needed  Activities of Daily Living      Permission Sought/Granted                  Emotional Assessment Appearance:: Appears stated age Attitude/Demeanor/Rapport: Engaged Affect (typically observed): Appropriate Orientation: : Oriented to Self, Oriented to Place, Oriented to  Time, Oriented to Situation Alcohol / Substance Use: Not Applicable Psych Involvement: No (comment)  Admission diagnosis:  Severe persistent asthma with status asthmaticus [J45.52] Acute asthma exacerbation [J45.901] Acute on chronic respiratory failure with hypercapnia (HCC) [J96.22] Patient Active Problem List   Diagnosis Date Noted   Acute  asthma exacerbation 12/17/2020   Obstructive lung disease (HCC) 12/17/2020   Acute on chronic respiratory failure with hypoxia and hypercapnia (HCC) 12/17/2020   Heart failure with preserved ejection fraction (HCC) 10/01/2020   Obesity hypoventilation syndrome (HCC) 09/29/2020   Acute respiratory failure with hypoxia (HCC) 09/29/2020   Normocytic anemia 09/29/2020   Mild concentric left ventricular hypertrophy (LVH) 09/29/2020   NAFLD (nonalcoholic fatty liver disease) 09/32/3557   Asthma exacerbation 09/28/2020   Acute respiratory failure due to COVID-19 (HCC) 05/21/2020   Gastroesophageal reflux disease without esophagitis 06/19/2019   Loud snoring 06/19/2019   Type 2 diabetes mellitus without complication, without long-term current use of insulin (HCC) 03/04/2019   Abdominal pain 03/03/2019   HTN (hypertension) 03/03/2019   Hyperglycemia 03/03/2019   Proteinuria 03/03/2019   HCAP (healthcare-associated pneumonia) 09/07/2015   Severe sepsis (HCC) 09/07/2015   Asthma 09/07/2015   Pleuritic chest pain 09/01/2015   Morbid obesity (HCC) 09/01/2015   OSA (obstructive sleep apnea) 09/01/2015   Influenza A 09/01/2015   Biliary colic 09/04/2014   PCP:  Grayce Sessions, NP Pharmacy:   [facility] [phone] Ginette Otto, [place] - [address] AT [facility] [address] [place] [address] Phone: [phone] Fax: [phone]  [facility] [address] [place] [address] Phone: [phone] Fax: [phone]     Social Determinants of Health (SDOH) Interventions    Readmission Risk Interventions Readmission Risk Prevention Plan 12/21/2020  Transportation Screening Complete  Medication Review Oceanographer) Complete  HRI or Home Care Consult Complete  SW Recovery Care/Counseling Consult Complete  Palliative Care Screening Not Applicable  Skilled Nursing Facility Not Applicable  Some recent  data might be hidden

## 2020-12-21 NOTE — Discharge Summary (Addendum)
Physician Discharge Summary  Yvette Gibbs VXB:[id number] DOB: [birth year] DOA: 12/16/2020  PCP: Yvette Perna, NP  Admit date: 12/16/2020 Discharge date: 12/21/2020  Time spent: 35 minutes  Recommendations for Outpatient Follow-up  Covid vaccination; vaccinated 1/3      OHS/OSA -Continuous pulse ox - BiPAP per respiratory -Albuterol nebulizer as needed - Breo Ellipta 100-25 mcg 1 puff daily -Incentive spirometry - Flutter valve - 7/8 trial of Trilogy NIV machine complete.   -7/8 follow-up with Yvette Gibbs  PCCM on Wednesday 13 July @ 1030   Sepsis/ Asthma exacerbation - Upon admission patient met criteria for Sepsis RR> 20, HR> 100, site of infection lungs -Treat 5 day of antibiotics - Trend procalcitonin/lactic acid Results for Yvette Gibbs, Yvette Gibbs (MRN [id number]) as of 12/21/2020 11:22  Ref. Range 09/07/2015 04:37 05/21/2020 20:27 12/19/2020 11:34 12/20/2020 04:36 12/21/2020 01:38  Procalcitonin Latest Units: ng/mL <0.10 0.26 <0.10 <0.10 <0.10   Chronic diastolic CHF -Strict in and out -10.3 L - Daily weight Filed Weights   12/19/20 0444 12/20/20 0419 12/21/20 0500  Weight: (!) 172.9 kg (!) 171.2 kg (!) 172.4 kg  -CXR :  noted to have some pulmonary vascular congestion.  BNP was within normal limits at 14.9.  Patient had initially been given furosemide 80 mg IV x1 dose.  Last echocardiogram revealed EF of 55 to 60% with normal diastolic heart function in 09/2020.   Essential HTN - Amlodipine 10 mg-olmesartan 40 mg daily -Torsemide 40 mg twice daily   DM type II uncontrolled with hyperglycemia/DM Gastroparesis - 7/5 Hemoglobin A1c = 8.8  - home Lantus 55 units  BID - NovoLog 10 units qac -Trulicity - Metformin    Epigastric abdominal pain: -Chronic. Possibly could be multifactorial in nature given increased risk of GERD and Gastroparesis. -7/6 no complaints today - Continue Reglan, Protonix, Carafate, and Pepcid  Normocytic anemia: (Baseline HgB~10.9 g/dL)  Lab Results   Component Value Date   HGB 12.4 12/21/2020   HGB 12.9 12/20/2020   HGB 11.4 (L) 12/19/2020   HGB 10.9 (L) 12/18/2020   HGB 10.7 (L) 12/18/2020  -Stable  GERD -Continue home regimen once off BiPAP   Morbid obesity OSA: (BMI 70.4 kg/m)@@@@ .  During last hospitalization inpatient spirometry had been completed, but patient did not qualify for BiPAP at that time. -Continue CPAP nightly once able to be weaned off BiPAP -7/7 patient has agreed to participate in Bariatric surgery program.  In order for patient to participate in program she requires an in lab sleep study.  Have discussed case with sleep lab manager at [facility], who has arranged appointment with Dr. Rexene Gibbs on 7/13@0  900 (sleep Dr.) In order to begin process. -7/8 spoke at length with RN Yvette Gibbs who is familiar with bariatric surgery program.  Is going to stop by and instruct patient on updated protocol for enrolling in program   Hypokalemia - Potassium goal> 4 -K-Dur 50 mEq prior to discharge  Hypomagnesmia - Magnesium goal> 2    Discharge Diagnoses:  Principal Problem:   Acute on chronic respiratory failure with hypoxia and hypercapnia ([place]) Active Problems:   Morbid obesity (HCC)   OSA (obstructive sleep apnea)   Gastroesophageal reflux disease without esophagitis   Normocytic anemia   Acute asthma exacerbation   Obstructive lung disease ([place])   Discharge Condition: Stable  Diet recommendation: Heart healthy/carb modified  Filed Weights   12/19/20 0444 12/20/20 0419 12/21/20 0500  Weight: (!) 172.9 kg (!) 171.2 kg (!) 172.4 kg  History of present illness:  Yvette Gibbs is a 30 y.o. BF PMHx HTN, DM type II uncontrolled with complication, t, morbid obesity, and suspect OSA/OHS  Presents with complaints of 3 days of shortness of breath.  History is limited due to patient being currently on BiPAP.  She reports that she has been having a nonproductive cough with subjective fever, chills,  and epigastric pain.  Denies having any vomiting or leg swelling.  She tried using her home inhaler without any relief in symptoms.  Last hospitalized in April of this year with acute on chronic hypoxic respiratory failure, but had not been sent for a sleep study.  At that time patient had inpatient spirometry on 4/22 which showed FEV1/FEV ratio of 23%, but did not qualify for. Her grandmother states that over the last few days she had been in significant respiratory distress with wheezing and been sleeping all the time.   ED Course: Upon admission into the emergency department patient was seen to be afebrile, pulse 105-110, respiration 21-22, blood pressures 132/65-170 7/98, and O2 saturations noted to be as low as 63% while asleep with improvement after patient placed on BiPAP.  Initial ABG revealed pH 7.277, PCO2 76, and PO2 91.  Labs significant for hemoglobin 10.9, sodium 134, glucose 229, and BNP 14.9.  Chest x-ray noted mild cardiomegaly with vascular congestion.  She had been given 125 mg of Solu-Medrol IV, multiple breathing treatments, 2 g of magnesium sulfate, and 80 mg of Lasix IV.  Hospital Course:  See above   Consultations: Trilogy    Discharge Exam: Vitals:   12/21/20 0021 12/21/20 0500 12/21/20 0753 12/21/20 0757  BP: 128/75 125/87 124/80   Pulse: 88 73 85   Resp: 18 18    Temp: 98.9 F (37.2 C) 97.9 F (36.6 C) 98 F (36.7 C)   TempSrc: Oral Oral Oral   SpO2: 100% 99% 96% 95%  Weight:  (!) 172.4 kg    Height:        General: A/O x4, No acute respiratory distress Eyes: negative scleral hemorrhage, negative anisocoria, negative icterus ENT: Negative Runny nose, negative gingival bleeding, Neck:  Negative scars, masses, torticollis, lymphadenopathy, JVD Lungs: Clear to auscultation bilaterally without wheezes or crackles Cardiovascular: Regular rate and rhythm without murmur gallop or rub normal S1 and S2  Discharge Instructions   Allergies as of 12/21/2020        Reactions   Fish Allergy Itching, Rash   Lisinopril Rash   Caused a rash and caused her to sweat a lot.        Medication List     TAKE these medications    Accu-Chek Guide Me w/Device Kit Use as directed to check blood sugar once daily.   Accu-Chek Guide test strip Generic drug: glucose blood Use as directed to check blood sugar once daily.   Accu-Chek Softclix Lancet Dev Kit Use as directed with lancets   Accu-Chek Softclix Lancets lancets Use as instructed   acetaminophen 500 MG tablet Commonly known as: TYLENOL Take 1,000 mg by mouth every 6 (six) hours as needed for mild pain or fever.   albuterol 108 (90 Base) MCG/ACT inhaler Commonly known as: VENTOLIN HFA Inhale 1-2 puffs into the lungs every 6 (six) hours as needed for wheezing or shortness of breath.   amLODipine-olmesartan 10-40 MG tablet Commonly known as: AZOR Take 1 tablet by mouth daily.   cyclobenzaprine 5 MG tablet Commonly known as: FLEXERIL Take 1 tablet (5 mg total) by mouth  3 (three) times daily as needed for muscle spasms.   famotidine 20 MG tablet Commonly known as: PEPCID Take 1 tablet (20 mg total) by mouth 2 (two) times daily.   fluticasone 50 MCG/ACT nasal spray Commonly known as: FLONASE Place 1 spray into both nostrils daily.   insulin aspart 100 UNIT/ML FlexPen Commonly known as: NOVOLOG Inject 10 Units into the skin 3 (three) times daily with meals.   insulin glargine 100 UNIT/ML injection Commonly known as: LANTUS Inject 0.55 mLs (55 Units total) into the skin 2 (two) times daily.   loratadine 10 MG tablet Commonly known as: CLARITIN Take 10 mg by mouth daily as needed for allergies.   metFORMIN 1000 MG tablet Commonly known as: GLUCOPHAGE Take 1 tablet (1,000 mg total) by mouth 2 (two) times daily with a meal.   metoCLOPramide 5 MG tablet Commonly known as: REGLAN Take 1 tablet (5 mg total) by mouth 3 (three) times daily before meals.   omeprazole 20 MG  capsule Commonly known as: PRILOSEC Take 20 mg by mouth daily.   PEN NEEDLES 29GX1/2" 29G X 12MM Misc Use as directed   sucralfate 1 GM/10ML suspension Commonly known as: CARAFATE Take 10 mLs (1 g total) by mouth 4 (four) times daily -  with meals and at bedtime.   Torsemide 40 MG Tabs Take 40 mg by mouth 2 (two) times daily.   Trulicity 3.53 IR/4.4RX Sopn Generic drug: Dulaglutide Inject 0.75 mg into the skin once a week.       Allergies  Allergen Reactions   Fish Allergy Itching and Rash   Lisinopril Rash    Caused a rash and caused her to sweat a lot.    Follow-up Information     [facility]. Schedule an appointment as soon as possible for a visit.   Why: GO: July 13 @ 9am for consult Contact information: [address]     [address] [place] [address] [phone]        Yvette Perna, NP. Schedule an appointment as soon as possible for a visit on 12/24/2020.   Specialty: Internal Medicine Why: @3 :10pm Contact information: [address] [place] [address] [phone]         Chesley Mires, MD Follow up on 12/25/2020.   Specialty: Pulmonary Disease Why: Follow up to get sleep study scheduled.  Appointment at 10:30 am. Contact information: [place] [address] [phone]                  The results of significant diagnostics from this hospitalization (including imaging, microbiology, ancillary and laboratory) are listed below for reference.    Significant Diagnostic Studies: DG Chest 2 View  Result Date: 12/16/2020 CLINICAL DATA:  Shortness of breath EXAM: CHEST - 2 VIEW COMPARISON:  10/03/2020 FINDINGS: Mild cardiomegaly with vascular congestion. No focal opacity or pleural effusion. No pneumothorax. IMPRESSION: Mild cardiomegaly with vascular congestion Electronically Signed   By: Donavan Foil M.D.   On: 12/16/2020 23:38    Microbiology: Recent Results  (from the past 240 hour(s))  SARS CORONAVIRUS 2 (TAT 6-24 HRS) Nasopharyngeal Nasopharyngeal Swab     Status: None   Collection Time: 12/17/20  6:37 AM   Specimen: Nasopharyngeal Swab  Result Value Ref Range Status   SARS Coronavirus 2 NEGATIVE NEGATIVE Final    Comment: (NOTE) SARS-CoV-2 target nucleic acids are NOT DETECTED.  The SARS-CoV-2 RNA is generally detectable in upper and lower respiratory specimens  during the acute phase of infection. Negative results do not preclude SARS-CoV-2 infection, do not rule out co-infections with other pathogens, and should not be used as the sole basis for treatment or other patient management decisions. Negative results must be combined with clinical observations, patient history, and epidemiological information. The expected result is Negative.  Fact Sheet for Patients: SugarRoll.be  Fact Sheet for Healthcare Providers: https://www.Ainsley Deakins-mathews.com/  This test is not yet approved or cleared by the [place] FDA and  has been authorized for detection and/or diagnosis of SARS-CoV-2 by FDA under an Emergency Use Authorization (EUA). This EUA will remain  in effect (meaning this test can be used) for the duration of the COVID-19 declaration under Se ction 564(b)(1) of the Act, 21 U.S.C. section 360bbb-3(b)(1), unless the authorization is terminated or revoked sooner.  Performed at [facility], [place] [address], [place], [place] [address]      Labs: Basic Metabolic Panel: Recent Labs  Lab 12/16/20 2304 12/17/20 0612 12/17/20 1617 12/18/20 0323 12/19/20 0239 12/20/20 0436 12/21/20 0138  NA 134*   < > 135 134* 136 135 134*  K 4.1   < > 5.1 3.9 3.3* 4.5 3.5  CL 96*  --   --  92* 91* 89* 91*  CO2 31  --   --  33* 38* 34* 36*  GLUCOSE 229*  --   --  245* 132* 229* 214*  BUN 8  --   --  17 21* 23* 32*  CREATININE 0.76  --   --  0.83 0.75 0.79 0.94  CALCIUM 9.0  --   --  9.3 8.7* 9.5  9.4  MG  --   --   --   --  1.8 2.2 2.2  PHOS  --   --   --   --  4.6 3.4 4.1   < > = values in this interval not displayed.   Liver Function Tests: Recent Labs  Lab 12/18/20 0323 12/19/20 0239 12/20/20 0436 12/21/20 0138  AST 21 37 25 17  ALT 32 35 37 29  ALKPHOS 56 53 65 57  BILITOT 0.3 0.6 0.7 0.5  PROT 7.8 7.6 8.0 7.7  ALBUMIN 3.1* 3.1* 3.3* 3.2*   No results for input(s): LIPASE, AMYLASE in the last 168 hours. No results for input(s): AMMONIA in the last 168 hours. CBC: Recent Labs  Lab 12/16/20 2304 12/17/20 0612 12/18/20 0323 12/18/20 0851 12/19/20 0239 12/20/20 0436 12/21/20 0138  WBC 7.5  --  8.2 8.4 7.9 9.1 11.9*  NEUTROABS 4.3  --   --  6.3 4.4 7.9* 8.7*  HGB 10.9*   < > 10.7* 10.9* 11.4* 12.9 12.4  HCT 36.6   < > 37.1 36.9 39.2 42.8 41.6  MCV 89.9  --  89.6 89.1 89.1 86.6 85.2  PLT 267  --  303 288 288 326 353   < > = values in this interval not displayed.   Cardiac Enzymes: No results for input(s): CKTOTAL, CKMB, CKMBINDEX, TROPONINI in the last 168 hours. BNP: BNP (last 3 results) Recent Labs    09/28/20 0251 12/16/20 2304  BNP 44.9 14.9    ProBNP (last 3 results) No results for input(s): PROBNP in the last 8760 hours.  CBG: Recent Labs  Lab 12/20/20 2003 12/21/20 0021 12/21/20 0459 12/21/20 0748 12/21/20 1126  GLUCAP 334* 267* 227* 232* 281*       Signed:  Dia Crawford, MD [facility]

## 2020-12-23 ENCOUNTER — Telehealth: Payer: Self-pay

## 2020-12-23 NOTE — Telephone Encounter (Signed)
Transition Care Management Follow-up Telephone Call Date of discharge and from where: 12/21/2020, [facility]  How have you been since you were released from the hospital? She said she is feeling fine, trying to rest. She then mentioned that she continues to have muscle spasms in her back.  She had these for months prior to her hospitalization. She said that in the past, flexeril was not helpful with treating the spasms.  She plans to discuss this concern with Ms Randa Evens, NP tomorrow. Any questions or concerns? Yes- noted above.   Items Reviewed: Did the pt receive and understand the discharge instructions provided? Yes  Medications obtained and verified?  She said she does not have all of her medications and she is working on getting them  She also needs a glucometer. Other? No  Any new allergies since your discharge? No  Do you have support at home? Yes  -she is staying with her grandmother   Home Care and Equipment/Supplies: Were home health services ordered? no If so, what is the name of the agency? N/a  Has the agency set up a time to come to the patient's home? not applicable Were any new equipment or medical supplies ordered?  Yes: Trilogy  NIV What is the name of the medical supply agency? [facility] Were you able to get the supplies/equipment? yes Do you have any questions related to the use of the equipment or supplies? No  Functional Questionnaire: (I = Independent and D = Dependent) ADLs: independent  Follow up appointments reviewed:  PCP Hospital f/u appt confirmed? Yes  Scheduled to see [name], NP on 12/24/2020.  Specialist Hospital f/u appt confirmed? Yes  Scheduled to see pulmonary - 12/25/2020.  Are transportation arrangements needed?  She needs to call her insurance company for transportation to her appointments.  Instructed her to call this clinic if she is not able to arrange with the insurance company and [employer] can be arranged.  If their  condition worsens, is the pt aware to call PCP or go to the Emergency Dept.? Yes Was the patient provided with contact information for the PCP's office or ED? Yes Was to pt encouraged to call back with questions or concerns? Yes

## 2020-12-24 ENCOUNTER — Other Ambulatory Visit: Payer: Self-pay

## 2020-12-24 ENCOUNTER — Telehealth (INDEPENDENT_AMBULATORY_CARE_PROVIDER_SITE_OTHER): Payer: Medicaid Other | Admitting: Primary Care

## 2020-12-24 DIAGNOSIS — Z09 Encounter for follow-up examination after completed treatment for conditions other than malignant neoplasm: Secondary | ICD-10-CM | POA: Diagnosis not present

## 2020-12-24 DIAGNOSIS — M62838 Other muscle spasm: Secondary | ICD-10-CM

## 2020-12-24 DIAGNOSIS — G8929 Other chronic pain: Secondary | ICD-10-CM

## 2020-12-24 DIAGNOSIS — M545 Low back pain, unspecified: Secondary | ICD-10-CM

## 2020-12-24 MED ORDER — MELOXICAM 7.5 MG PO TABS: 7.5000 mg | ORAL_TABLET | Freq: Every day | ORAL | 1 refills | Status: DC

## 2020-12-25 ENCOUNTER — Institutional Professional Consult (permissible substitution): Payer: Self-pay | Admitting: Neurology

## 2020-12-25 ENCOUNTER — Encounter: Payer: Self-pay | Admitting: Pulmonary Disease

## 2020-12-25 ENCOUNTER — Ambulatory Visit (INDEPENDENT_AMBULATORY_CARE_PROVIDER_SITE_OTHER): Payer: Medicaid Other | Admitting: Pulmonary Disease

## 2020-12-25 VITALS — BP 140/78 | HR 96 | Temp 98.6°F | Ht 62.0 in | Wt 392.8 lb

## 2020-12-25 DIAGNOSIS — R0600 Dyspnea, unspecified: Secondary | ICD-10-CM

## 2020-12-25 DIAGNOSIS — Z6841 Body Mass Index (BMI) 40.0 and over, adult: Secondary | ICD-10-CM | POA: Diagnosis not present

## 2020-12-25 DIAGNOSIS — R0683 Snoring: Secondary | ICD-10-CM | POA: Diagnosis not present

## 2020-12-25 DIAGNOSIS — R0609 Other forms of dyspnea: Secondary | ICD-10-CM

## 2020-12-25 MED ORDER — FAMOTIDINE 20 MG PO TABS: 20.0000 mg | ORAL_TABLET | Freq: Two times a day (BID) | ORAL | 0 refills | Status: DC

## 2020-12-25 MED ORDER — PANTOPRAZOLE SODIUM 40 MG PO TBEC: 40.0000 mg | DELAYED_RELEASE_TABLET | Freq: Every day | ORAL | 0 refills | Status: DC

## 2020-12-25 NOTE — Progress Notes (Signed)
[facility]  Chief Complaint  Patient presents with   Follow-up    PHOS x 4 days, sob slightly better, dry cough/wheeze    Constitutional:  BP 140/78 (BP Location: Right Wrist, Cuff Size: Large)   Pulse 96   Temp 98.6 F (37 C) (Temporal)   Ht _0  (1.575 m)   Wt (!) 392 lb 12.8 oz (178.2 kg)   LMP 12/05/2020   SpO2 96%   BMI 71.84 kg/m   Past Medical History:  Depression, DM type 2, Asthma, HTN  Past Surgical History:  She  has a past surgical history that includes Appendectomy; Tonsillectomy; and Cholecystectomy (N/A, 09/05/2014).  Brief Summary:  Yvette Gibbs is a 30 y.o. female with snoring.      Subjective:   She was in hospital earlier this month for dyspnea.  Started on Bipap.  Concern for sleep apnea.  She has snoring, sleep disruption, apnea, and daytime sleepiness.  Did well with Bipap in hospital.  She lives with her grandmother.  Her grandmother moved while she was in hospital, and she lost several of her medications.  She had spirometry before, but test results aren't accurate.  She hasn't been using albuterol.  Not having cough, wheeze, sputum, or chest congestion.  She has appointment with weight loss surgery program set up.  Physical Exam:   Appearance - well kempt   ENMT - no sinus tenderness, no oral exudate, no LAN, Mallampati 4 airway, no stridor  Respiratory - equal breath sounds bilaterally, no wheezing or rales  CV - s1s2 regular rate and rhythm, no murmurs  Ext - no clubbing, no edema  Skin - no rashes  Psych - normal mood and affect   Pulmonary testing:  ABG 12/17/20 >> pH 7.27, PCO2 76, PO2 91  Sleep Tests:    Cardiac Tests:  Echo 09/28/20 >> EF 55 to 60%, mild LVH  Social History:  She  reports that she has never smoked. She has never used smokeless tobacco. She reports current alcohol use. She reports current drug use. Drug: Marijuana.  Family History:  Her family history  includes Diabetes Mellitus II in her maternal grandmother.     Assessment/Plan:   Snoring likely from obstructive sleep apnea with obesity hypoventilation syndrome. - she has sleep disruption, apnea, and daytime sleepiness - she has history of hypertension - will arrange for home sleep study to assess whether she has sleep apnea - can then set up auto CPAP to expedite therapy and arrange for in lab titration study to determine is she might need Bipap +/- supplemental oxygen at night  Dyspnea on exertion. - most likely from obesity with deconditioning, but also has reported history of asthma - will arrange for PFT - prn albuterol  Morbid obesity. - she will follow up with weight loss surgery program  GERD. - refilled meds for pepcid and protonix >> future refills from her PCP   Time Spent Involved in Patient Care on Day of Examination:  34 minutes  Follow up:   Patient Instructions  Will make sure home sleep study is scheduled  Will arrange for pulmonary function test   Medication List:   Allergies as of 12/25/2020       Reactions   Fish Allergy Itching, Rash   Lisinopril Rash   Caused a rash and caused her to sweat a lot.        Medication List        Accurate as  of December 25, 2020 10:42 AM. If you have any questions, ask your nurse or doctor.          STOP taking these medications    acetaminophen 500 MG tablet Commonly known as: TYLENOL Stopped by: Chesley Mires, MD   cyclobenzaprine 5 MG tablet Commonly known as: FLEXERIL Stopped by: Chesley Mires, MD   fluticasone 50 MCG/ACT nasal spray Commonly known as: FLONASE Stopped by: Chesley Mires, MD   loratadine 10 MG tablet Commonly known as: CLARITIN Stopped by: Chesley Mires, MD   meloxicam 7.5 MG tablet Commonly known as: MOBIC Stopped by: Chesley Mires, MD   metoCLOPramide 5 MG tablet Commonly known as: REGLAN Stopped by: Chesley Mires, MD   omeprazole 20 MG capsule Commonly known as:  PRILOSEC Stopped by: Chesley Mires, MD   sucralfate 1 GM/10ML suspension Commonly known as: CARAFATE Stopped by: Chesley Mires, MD       TAKE these medications    Accu-Chek Guide Me w/Device Kit Use as directed to check blood sugar once daily.   Accu-Chek Guide test strip Generic drug: glucose blood Use as directed to check blood sugar once daily.   Accu-Chek Softclix Lancet Dev Kit Use as directed with lancets   Accu-Chek Softclix Lancets lancets Use as instructed   albuterol 108 (90 Base) MCG/ACT inhaler Commonly known as: VENTOLIN HFA Inhale 1-2 puffs into the lungs every 6 (six) hours as needed for wheezing or shortness of breath.   amLODipine-olmesartan 10-40 MG tablet Commonly known as: AZOR Take 1 tablet by mouth daily.   famotidine 20 MG tablet Commonly known as: PEPCID Take 1 tablet (20 mg total) by mouth 2 (two) times daily.   insulin aspart 100 UNIT/ML FlexPen Commonly known as: NOVOLOG Inject 10 Units into the skin 3 (three) times daily with meals.   insulin glargine 100 UNIT/ML injection Commonly known as: LANTUS Inject 0.55 mLs (55 Units total) into the skin 2 (two) times daily.   metFORMIN 1000 MG tablet Commonly known as: GLUCOPHAGE Take 1 tablet (1,000 mg total) by mouth 2 (two) times daily with a meal.   pantoprazole 40 MG tablet Commonly known as: PROTONIX Take 1 tablet (40 mg total) by mouth daily. Started by: Chesley Mires, MD   PEN NEEDLES 29GX1/2" 29G X 12MM Misc Use as directed   Torsemide 40 MG Tabs Take 40 mg by mouth 2 (two) times daily.   Trulicity 4.25 ZD/6.3OV Sopn Generic drug: Dulaglutide Inject 0.75 mg into the skin once a week.        Signature:  Chesley Mires, MD [place] Pager - [phone] 12/25/2020, 10:42 AM

## 2020-12-25 NOTE — Patient Instructions (Addendum)
Will make sure home sleep study is scheduled  Will arrange for pulmonary function test  Follow up in 3 months

## 2020-12-25 NOTE — Progress Notes (Signed)
[facility]  Telephone Note  I connected with Yvette Gibbs, on 12/25/2020 at 1:51 PM through an audio and by telephone and verified that I am speaking with the correct person using two identifiers.   Consent: I discussed the limitations, risks, security and privacy concerns of performing an evaluation and management service by telephone and the availability of in person appointments. I also discussed with the patient that there may be a patient responsible charge related to this service. The patient expressed understanding and agreed to proceed.   Location of Patient: Home  Location of Provider: [facility] [place]   Persons participating in Telemedicine visit: Alianis Heinke Juluis Mire,  NP  History of Present Illness:  Yvette Gibbs is a 30 y.o. female presents for hospital follow up and establish care. Admit date to the hospital was 12/16/20, patient was discharged from the hospital on 12/21/20, patient was admitted for: Acute on chronic respiratory failure with hypoxia and hypercapnia.   Past Medical History:  Diagnosis Date   Asthma    Depression    Diabetes mellitus type 2, uncontrolled ([place])    Extreme obesity    Family history of adverse reaction to anesthesia    " my grandmother had an allergic reaction   Hypertension    Mental disorder    Obesity    OSA (obstructive sleep apnea)    Allergies  Allergen Reactions   Fish Allergy Itching and Rash   Lisinopril Rash    Caused a rash and caused her to sweat a lot.    Current Outpatient Medications on File Prior to Visit  Medication Sig Dispense Refill   Accu-Chek Softclix Lancets lancets Use as instructed 100 each 12   albuterol (VENTOLIN HFA) 108 (90 Base) MCG/ACT inhaler Inhale 1-2 puffs into the lungs every 6 (six) hours as needed for wheezing or shortness of breath. 18 g 6   amLODipine-olmesartan (AZOR) 10-40  MG tablet Take 1 tablet by mouth daily. (Patient not taking: Reported on 12/25/2020) 90 tablet 1   Blood Glucose Monitoring Suppl (ACCU-CHEK GUIDE ME) w/Device KIT Use as directed to check blood sugar once daily. (Patient not taking: Reported on 12/25/2020) 1 kit 0   Dulaglutide (TRULICITY) 4.09 WJ/1.9JY SOPN Inject 0.75 mg into the skin once a week. 2 mL 4   glucose blood (ACCU-CHEK GUIDE) test strip Use as directed to check blood sugar once daily. (Patient not taking: Reported on 12/25/2020) 100 each 3   insulin aspart (NOVOLOG) 100 UNIT/ML FlexPen Inject 10 Units into the skin 3 (three) times daily with meals. (Patient not taking: Reported on 12/25/2020) 15 mL 11   insulin glargine (LANTUS) 100 UNIT/ML injection Inject 0.55 mLs (55 Units total) into the skin 2 (two) times daily. (Patient not taking: Reported on 12/25/2020) 10 mL 11   Insulin Pen Needle (PEN NEEDLES 29GX1/2") 29G X 12MM MISC Use as directed 200 each 0   Lancets Misc. (ACCU-CHEK SOFTCLIX LANCET DEV) KIT Use as directed with lancets (Patient not taking: Reported on 12/25/2020) 1 kit 0   metFORMIN (GLUCOPHAGE) 1000 MG tablet Take 1 tablet (1,000 mg total) by mouth 2 (two) times daily with a meal. 180 tablet 3   torsemide 40 MG TABS Take 40 mg by mouth 2 (two) times daily. 60 tablet 0   No current facility-administered medications on file prior to visit.    Observations/Objective: LMP 12/05/2020    Assessment and Plan: Diagnoses and all orders  for this visit:  Hospital discharge follow-up Retrieved from hospital discharge  Schedule an appointment with [place]; GO: July 13 @ 9am for consult- reminded her of her appt  Schedule an appointment with Kerin Perna, NP (Internal Medicine) on 12/24/2020; @3 :10pm- completed  Follow up with Chesley Mires, MD (Pulmonary Disease) on 12/25/2020; Follow up to get sleep study scheduled.  Appointment at 10:30 am   Muscle spasm Chronic back pain  -     Discontinue:  meloxicam (MOBIC) 7.5 MG tablet; Take 1 tablet (7.5 mg total) by mouth daily. (Patient not taking: Reported on 12/25/2020)  Chronic bilateral low back pain, unspecified whether sciatica present Discussed weight can be a contributing factor work on losing weight to help reduce  pain. May alternate with heat and ice application for pain relief. May also alternate with acetaminophen and Ibuprofen as prescribed pain relief. Other alternatives include massage, acupuncture and water aerobics.  You must stay active and avoid a sedentary lifestyle.     Follow Up Instructions: Keep scheduled appointment with neurologist   I discussed the assessment and treatment plan with the patient. The patient was provided an opportunity to ask questions and all were answered. The patient agreed with the plan and demonstrated an understanding of the instructions.   The patient was advised to call back or seek an in-person evaluation if the symptoms worsen or if the condition fails to improve as anticipated.     I provided 20 minutes total of non-face-to-face time during this encounter including median intraservice time, reviewing previous notes, investigations, ordering medications, medical decision making, coordinating care and patient verbalized understanding at the end of the visit.    This note has been created with Surveyor, quantity. Any transcriptional errors are unintentional.   Kerin Perna, NP 12/25/2020, 1:51 PM   Subjective:   Past Medical History:  Diagnosis Date   Asthma    Depression    Diabetes mellitus type 2, uncontrolled ([place])    Extreme obesity    Family history of adverse reaction to anesthesia    " my grandmother had an allergic reaction   Hypertension    Mental disorder    Obesity    OSA (obstructive sleep apnea)      Allergies  Allergen Reactions   Fish Allergy Itching and Rash   Lisinopril Rash    Caused a rash and caused  her to sweat a lot.      Current Outpatient Medications on File Prior to Visit  Medication Sig Dispense Refill   Accu-Chek Softclix Lancets lancets Use as instructed 100 each 12   albuterol (VENTOLIN HFA) 108 (90 Base) MCG/ACT inhaler Inhale 1-2 puffs into the lungs every 6 (six) hours as needed for wheezing or shortness of breath. 18 g 6   amLODipine-olmesartan (AZOR) 10-40 MG tablet Take 1 tablet by mouth daily. (Patient not taking: Reported on 12/25/2020) 90 tablet 1   Blood Glucose Monitoring Suppl (ACCU-CHEK GUIDE ME) w/Device KIT Use as directed to check blood sugar once daily. (Patient not taking: Reported on 12/25/2020) 1 kit 0   Dulaglutide (TRULICITY) 7.74 JO/8.7OM SOPN Inject 0.75 mg into the skin once a week. 2 mL 4   glucose blood (ACCU-CHEK GUIDE) test strip Use as directed to check blood sugar once daily. (Patient not taking: Reported on 12/25/2020) 100 each 3   insulin aspart (NOVOLOG) 100 UNIT/ML FlexPen Inject 10 Units into the skin 3 (three) times daily with meals. (Patient  not taking: Reported on 12/25/2020) 15 mL 11   insulin glargine (LANTUS) 100 UNIT/ML injection Inject 0.55 mLs (55 Units total) into the skin 2 (two) times daily. (Patient not taking: Reported on 12/25/2020) 10 mL 11   Insulin Pen Needle (PEN NEEDLES 29GX1/2") 29G X 12MM MISC Use as directed 200 each 0   Lancets Misc. (ACCU-CHEK SOFTCLIX LANCET DEV) KIT Use as directed with lancets (Patient not taking: Reported on 12/25/2020) 1 kit 0   metFORMIN (GLUCOPHAGE) 1000 MG tablet Take 1 tablet (1,000 mg total) by mouth 2 (two) times daily with a meal. 180 tablet 3   torsemide 40 MG TABS Take 40 mg by mouth 2 (two) times daily. 60 tablet 0   No current facility-administered medications on file prior to visit.     Review of System: Review of Systems  Musculoskeletal:  Positive for back pain.  All other systems reviewed and are negative.  Objective:  LMP 12/05/2020   There were no vitals filed for this  visit.   Assessment:   1. Hospital discharge follow-up   2. Muscle spasm   3. Chronic bilateral low back pain, unspecified whether sciatica present     Meds ordered this encounter  Medications   DISCONTD: meloxicam (MOBIC) 7.5 MG tablet    Sig: Take 1 tablet (7.5 mg total) by mouth daily.    Dispense:  30 tablet    Refill:  1    This note has been created with Surveyor, quantity. Any transcriptional errors are unintentional.   Kerin Perna, NP 12/25/2020, 1:50 PM

## 2020-12-30 ENCOUNTER — Other Ambulatory Visit: Payer: Self-pay | Admitting: Student

## 2020-12-30 ENCOUNTER — Ambulatory Visit: Payer: Medicaid Other

## 2020-12-30 ENCOUNTER — Other Ambulatory Visit: Payer: Self-pay

## 2021-01-02 ENCOUNTER — Other Ambulatory Visit: Payer: Self-pay

## 2021-01-02 ENCOUNTER — Emergency Department (HOSPITAL_COMMUNITY)
Admission: EM | Admit: 2021-01-02 | Discharge: 2021-01-03 | Disposition: A | Discharge status: Home / Self Care | Payer: Medicaid Other | Attending: Emergency Medicine | Admitting: Emergency Medicine

## 2021-01-02 ENCOUNTER — Encounter (HOSPITAL_COMMUNITY): Payer: Self-pay | Admitting: Emergency Medicine

## 2021-01-02 DIAGNOSIS — I10 Essential (primary) hypertension: Secondary | ICD-10-CM | POA: Diagnosis not present

## 2021-01-02 DIAGNOSIS — R1111 Vomiting without nausea: Secondary | ICD-10-CM | POA: Diagnosis not present

## 2021-01-02 DIAGNOSIS — Z9889 Other specified postprocedural states: Secondary | ICD-10-CM | POA: Diagnosis not present

## 2021-01-02 DIAGNOSIS — J45901 Unspecified asthma with (acute) exacerbation: Secondary | ICD-10-CM | POA: Insufficient documentation

## 2021-01-02 DIAGNOSIS — R1110 Vomiting, unspecified: Secondary | ICD-10-CM | POA: Diagnosis present

## 2021-01-02 DIAGNOSIS — Z8616 Personal history of COVID-19: Secondary | ICD-10-CM | POA: Insufficient documentation

## 2021-01-02 DIAGNOSIS — Z743 Need for continuous supervision: Secondary | ICD-10-CM | POA: Diagnosis not present

## 2021-01-02 DIAGNOSIS — Z7984 Long term (current) use of oral hypoglycemic drugs: Secondary | ICD-10-CM | POA: Diagnosis not present

## 2021-01-02 DIAGNOSIS — R109 Unspecified abdominal pain: Secondary | ICD-10-CM | POA: Diagnosis not present

## 2021-01-02 DIAGNOSIS — Z20822 Contact with and (suspected) exposure to covid-19: Secondary | ICD-10-CM | POA: Insufficient documentation

## 2021-01-02 DIAGNOSIS — R197 Diarrhea, unspecified: Secondary | ICD-10-CM | POA: Insufficient documentation

## 2021-01-02 DIAGNOSIS — R112 Nausea with vomiting, unspecified: Secondary | ICD-10-CM | POA: Diagnosis not present

## 2021-01-02 DIAGNOSIS — Z79899 Other long term (current) drug therapy: Secondary | ICD-10-CM | POA: Diagnosis not present

## 2021-01-02 DIAGNOSIS — Z794 Long term (current) use of insulin: Secondary | ICD-10-CM | POA: Diagnosis not present

## 2021-01-02 DIAGNOSIS — E119 Type 2 diabetes mellitus without complications: Secondary | ICD-10-CM | POA: Diagnosis not present

## 2021-01-02 DIAGNOSIS — Z9049 Acquired absence of other specified parts of digestive tract: Secondary | ICD-10-CM | POA: Diagnosis not present

## 2021-01-02 DIAGNOSIS — R110 Nausea: Secondary | ICD-10-CM | POA: Diagnosis not present

## 2021-01-02 LAB — COMPREHENSIVE METABOLIC PANEL
ALT: 37 U/L (ref 0–44)
AST: 29 U/L (ref 15–41)
Albumin: 4.3 g/dL (ref 3.5–5.0)
Alkaline Phosphatase: 69 U/L (ref 38–126)
Anion gap: 15 (ref 5–15)
BUN: 19 mg/dL (ref 6–20)
CO2: 33 mmol/L — ABNORMAL HIGH (ref 22–32)
Calcium: 9.3 mg/dL (ref 8.9–10.3)
Chloride: 90 mmol/L — ABNORMAL LOW (ref 98–111)
Creatinine, Ser: 1.21 mg/dL — ABNORMAL HIGH (ref 0.44–1.00)
GFR, Estimated: >60 mL/min (ref >60)
Glucose, Bld: 235 mg/dL — ABNORMAL HIGH (ref 70–99)
Potassium: 3.7 mmol/L (ref 3.5–5.1)
Sodium: 138 mmol/L (ref 135–145)
Total Bilirubin: 0.7 mg/dL (ref 0.3–1.2)
Total Protein: 9 g/dL — ABNORMAL HIGH (ref 6.5–8.1)

## 2021-01-02 LAB — I-STAT BETA HCG BLOOD, ED (MC, WL, AP ONLY): I-stat hCG, quantitative: <5 m[IU]/mL (ref <5)

## 2021-01-02 LAB — CBC WITH DIFFERENTIAL/PLATELET
Abs Immature Granulocytes: 0.01 10*3/uL (ref 0.00–0.07)
Basophils Absolute: 0 10*3/uL (ref 0.0–0.1)
Basophils Relative: 0 %
Eosinophils Absolute: 0 10*3/uL (ref 0.0–0.5)
Eosinophils Relative: 0 %
HCT: 44.9 % (ref 36.0–46.0)
Hemoglobin: 13.5 g/dL (ref 12.0–15.0)
Immature Granulocytes: 0 %
Lymphocytes Relative: 21 %
Lymphs Abs: 1.6 10*3/uL (ref 0.7–4.0)
MCH: 26 pg (ref 26.0–34.0)
MCHC: 30.1 g/dL (ref 30.0–36.0)
MCV: 86.5 fL (ref 80.0–100.0)
Monocytes Absolute: 0.8 10*3/uL (ref 0.1–1.0)
Monocytes Relative: 11 %
Neutro Abs: 5.1 10*3/uL (ref 1.7–7.7)
Neutrophils Relative %: 68 %
Platelets: 401 10*3/uL — ABNORMAL HIGH (ref 150–400)
RBC: 5.19 MIL/uL — ABNORMAL HIGH (ref 3.87–5.11)
RDW: 14.8 % (ref 11.5–15.5)
WBC: 7.5 10*3/uL (ref 4.0–10.5)
nRBC: 0 % (ref 0.0–0.2)

## 2021-01-02 LAB — LIPASE, BLOOD: Lipase: 36 U/L (ref 11–51)

## 2021-01-02 NOTE — ED Provider Notes (Signed)
Emergency Medicine Provider Triage Evaluation Note  Yvette Gibbs , a 30 y.o. female  was evaluated in triage.  Pt complains of N/V/D.  Began this morning.  Multiple episodes of NBNB emesis.  Diarrhea without any melena or bright blood per rectum.  Some associated generalized abdominal pain, worse to epigastric region.  No chronic NSAIDs, EtOH use.  No history of known ulcers.  Has not taken anything for symptoms.  She did take a COVID test at home which was negative.  Was recently hospitalized for acute hypoxic respiratory failure however patient denies fever, chills, chest pain, shortness of breath or cough.  Denies chance of pregnancy  Review of Systems  Positive: Nausea, vomiting, diarrhea, generalized abdominal pain Negative: Chest pain, shortness of breath, fever, dysuria  Physical Exam  BP (!) 144/78 (BP Location: Right Arm)   Pulse (!) 116   Temp 99.3 F (37.4 C) (Oral)   Resp 19   Ht 5\' 2"  (1.575 m)   Wt (!) 172.4 kg   LMP 12/05/2020   SpO2 95%   BMI 69.50 kg/m  Gen:   Awake, no distress   Resp:  Normal effort  MSK:   Moves extremities without difficulty  Abd:   Soft, non tender however difficult exam due to body habitus Other:    Medical Decision Making  Medically screening exam initiated at 9:59 PM.  Appropriate orders placed.  Yvette Gibbs was informed that the remainder of the evaluation will be completed by another provider, this initial triage assessment does not replace that evaluation, and the importance of remaining in the ED until their evaluation is complete.  Nausea, vomiting, diarrhea, abdominal pain  Mildly tachycardic however per chart review has tachycardia on prior visits.  Does not appear septic.  Labs ordered   12/07/2020, PA-C 01/02/21 2201    2202, MD 01/06/21 [phone]

## 2021-01-02 NOTE — ED Triage Notes (Signed)
Per EMS, patient from home, N/V/D since 0700 today.

## 2021-01-03 ENCOUNTER — Encounter (HOSPITAL_COMMUNITY): Payer: Self-pay | Admitting: Emergency Medicine

## 2021-01-03 ENCOUNTER — Emergency Department (HOSPITAL_COMMUNITY): Payer: Medicaid Other

## 2021-01-03 DIAGNOSIS — R1111 Vomiting without nausea: Secondary | ICD-10-CM | POA: Diagnosis not present

## 2021-01-03 DIAGNOSIS — R112 Nausea with vomiting, unspecified: Secondary | ICD-10-CM | POA: Diagnosis not present

## 2021-01-03 DIAGNOSIS — R109 Unspecified abdominal pain: Secondary | ICD-10-CM | POA: Diagnosis not present

## 2021-01-03 DIAGNOSIS — Z9049 Acquired absence of other specified parts of digestive tract: Secondary | ICD-10-CM | POA: Diagnosis not present

## 2021-01-03 DIAGNOSIS — R197 Diarrhea, unspecified: Secondary | ICD-10-CM | POA: Diagnosis not present

## 2021-01-03 DIAGNOSIS — Z9889 Other specified postprocedural states: Secondary | ICD-10-CM | POA: Diagnosis not present

## 2021-01-03 LAB — SARS CORONAVIRUS 2 (TAT 6-24 HRS): SARS Coronavirus 2: NEGATIVE

## 2021-01-03 MED ORDER — DICYCLOMINE HCL 10 MG/ML IM SOLN
20.0000 mg | Freq: Once | INTRAMUSCULAR | Status: AC
  Administered 2021-01-03: 20 mg via INTRAMUSCULAR
  Filled 2021-01-03: qty 2

## 2021-01-03 MED ORDER — ALUM & MAG HYDROXIDE-SIMETH 200-200-20 MG/5ML PO SUSP
30.0000 mL | Freq: Once | ORAL | Status: DC
  Filled 2021-01-03: qty 30

## 2021-01-03 MED ORDER — ONDANSETRON 8 MG PO TBDP
8.0000 mg | ORAL_TABLET | Freq: Once | ORAL | Status: AC
  Administered 2021-01-03: 8 mg via ORAL
  Filled 2021-01-03: qty 1

## 2021-01-03 MED ORDER — ONDANSETRON 8 MG PO TBDP: ORAL_TABLET | ORAL | 0 refills | Status: DC

## 2021-01-03 NOTE — ED Notes (Signed)
Patient refusing Maalox , states she does not want to take it because it is liquid.

## 2021-01-03 NOTE — ED Provider Notes (Signed)
[facility] DEPT Provider Note   CSN: [id number] Arrival date & time: 01/02/21  2150     History Chief Complaint  Patient presents with   Emesis   Diarrhea    Yvette Gibbs is a 30 y.o. female.  The history is provided by the patient.  Emesis Severity:  Moderate Duration:  1 day Timing:  Rare Quality:  Stomach contents Able to tolerate:  Liquids Progression:  Resolved Chronicity:  New Recent urination:  Normal Context: not post-tussive   Relieved by:  Nothing Worsened by:  Nothing Associated symptoms: diarrhea   Associated symptoms: no abdominal pain, no arthralgias, no chills, no cough, no fever and no URI   Risk factors: no alcohol use   Diarrhea Quality:  Watery Severity:  Moderate Onset quality:  Gradual Duration:  1 day Timing:  Intermittent Progression:  Unchanged Relieved by:  Nothing Worsened by:  Nothing Ineffective treatments:  None tried Associated symptoms: vomiting   Associated symptoms: no abdominal pain, no arthralgias, no chills, no fever and no URI   Risk factors: no recent antibiotic use       Past Medical History:  Diagnosis Date   Asthma    Depression    Diabetes mellitus type 2, uncontrolled ([place])    Extreme obesity    Family history of adverse reaction to anesthesia    " my grandmother had an allergic reaction   Hypertension    Mental disorder    Obesity    OSA (obstructive sleep apnea)     Patient Active Problem List   Diagnosis Date Noted   Acute asthma exacerbation 12/17/2020   Obstructive lung disease ([place]) 12/17/2020   Acute on chronic respiratory failure with hypoxia and hypercapnia (HCC) 12/17/2020   Heart failure with preserved ejection fraction ([place]) 10/01/2020   Obesity hypoventilation syndrome ([place]) 09/29/2020   Acute respiratory failure with hypoxia (HCC) 09/29/2020   Normocytic anemia 09/29/2020   Mild concentric left ventricular hypertrophy (LVH) 09/29/2020   NAFLD  (nonalcoholic fatty liver disease) 09/29/2020   Asthma exacerbation 09/28/2020   Acute respiratory failure due to COVID-19 ([place]) 05/21/2020   Gastroesophageal reflux disease without esophagitis 06/19/2019   Loud snoring 06/19/2019   Type 2 diabetes mellitus without complication, without long-term current use of insulin ([place]) 03/04/2019   Abdominal pain 03/03/2019   HTN (hypertension) 03/03/2019   Hyperglycemia 03/03/2019   Proteinuria 03/03/2019   HCAP (healthcare-associated pneumonia) 09/07/2015   Severe sepsis ([place]) 09/07/2015   Asthma 09/07/2015   Pleuritic chest pain 09/01/2015   Morbid obesity ([place]) 09/01/2015   OSA (obstructive sleep apnea) 09/01/2015   Influenza A 70/26/3785   Biliary colic 88/50/2774    Past Surgical History:  Procedure Laterality Date   APPENDECTOMY     CHOLECYSTECTOMY N/A 09/05/2014   Procedure: LAPAROSCOPIC CHOLECYSTECTOMY;  Surgeon: Ralene Ok, MD;  Location: [facility] OR;  Service: General;  Laterality: N/A;   TONSILLECTOMY       OB History   No obstetric history on file.     Family History  Problem Relation Age of Onset   Diabetes Mellitus II Maternal Grandmother     Social History   Tobacco Use   Smoking status: Never   Smokeless tobacco: Never  Vaping Use   Vaping Use: Never used  Substance Use Topics   Alcohol use: Yes   Drug use: Yes    Types: Marijuana    Home Medications Prior to Admission medications   Medication Sig Start Date End Date Taking? Authorizing Provider  Accu-Chek Softclix Lancets lancets Use as instructed 10/08/20   Alexandria Lodge, MD  albuterol (VENTOLIN HFA) 108 (90 Base) MCG/ACT inhaler Inhale 1-2 puffs into the lungs every 6 (six) hours as needed for wheezing or shortness of breath. 03/21/19   Ladell Pier, MD  amLODipine-olmesartan (AZOR) 10-40 MG tablet Take 1 tablet by mouth daily. Patient not taking: Reported on 12/25/2020 10/24/20   Kerin Perna, NP  Blood Glucose Monitoring Suppl (ACCU-CHEK  GUIDE ME) w/Device KIT Use as directed to check blood sugar once daily. Patient not taking: Reported on 12/25/2020 10/08/20   Alexandria Lodge, MD  Dulaglutide (TRULICITY) 2.77 OE/4.2PN SOPN Inject 0.75 mg into the skin once a week. 10/24/20   Kerin Perna, NP  famotidine (PEPCID) 20 MG tablet Take 1 tablet (20 mg total) by mouth 2 (two) times daily. 12/25/20 01/24/21  Chesley Mires, MD  glucose blood (ACCU-CHEK GUIDE) test strip Use as directed to check blood sugar once daily. Patient not taking: Reported on 12/25/2020 10/08/20   Alexandria Lodge, MD  insulin aspart (NOVOLOG) 100 UNIT/ML FlexPen Inject 10 Units into the skin 3 (three) times daily with meals. Patient not taking: Reported on 12/25/2020 12/21/20   Allie Bossier, MD  insulin glargine (LANTUS) 100 UNIT/ML injection Inject 0.55 mLs (55 Units total) into the skin 2 (two) times daily. Patient not taking: Reported on 12/25/2020 10/08/20   Alexandria Lodge, MD  Insulin Pen Needle (PEN NEEDLES 29GX1/2") 29G X 12MM MISC Use as directed 12/21/20   Allie Bossier, MD  Lancets Misc. (ACCU-CHEK SOFTCLIX LANCET DEV) KIT Use as directed with lancets Patient not taking: Reported on 12/25/2020 10/08/20   Alexandria Lodge, MD  metFORMIN (GLUCOPHAGE) 1000 MG tablet Take 1 tablet (1,000 mg total) by mouth 2 (two) times daily with a meal. 10/24/20   Kerin Perna, NP  pantoprazole (PROTONIX) 40 MG tablet Take 1 tablet (40 mg total) by mouth daily. 12/25/20   Chesley Mires, MD  torsemide 40 MG TABS Take 40 mg by mouth 2 (two) times daily. 10/08/20   Alexandria Lodge, MD    Allergies    Fish allergy and Lisinopril  Review of Systems   Review of Systems  Constitutional:  Negative for chills and fever.  HENT:  Negative for facial swelling.   Eyes:  Negative for redness.  Respiratory:  Negative for cough.   Cardiovascular:  Negative for palpitations and leg swelling.  Gastrointestinal:  Positive for diarrhea, nausea and vomiting. Negative for abdominal pain.   Genitourinary:  Negative for difficulty urinating.  Musculoskeletal:  Negative for arthralgias.  Skin:  Negative for rash.  Neurological:  Negative for facial asymmetry.  Psychiatric/Behavioral:  Negative for agitation.   All other systems reviewed and are negative.  Physical Exam Updated Vital Signs BP 106/63   Pulse (!) 110   Temp 99.3 F (37.4 C) (Oral)   Resp 17   Ht 5' 2"  (1.575 m)   Wt (!) 172.4 kg   LMP 09/28/2020   SpO2 94%   BMI 69.50 kg/m   Physical Exam Vitals and nursing note reviewed.  Constitutional:      General: She is not in acute distress.    Appearance: Normal appearance.  HENT:     Head: Normocephalic and atraumatic.     Nose: Nose normal.  Eyes:     Conjunctiva/sclera: Conjunctivae normal.     Pupils: Pupils are equal, round, and reactive to light.  Cardiovascular:     Rate and Rhythm: Normal rate  and regular rhythm.     Pulses: Normal pulses.     Heart sounds: Normal heart sounds.  Pulmonary:     Effort: Pulmonary effort is normal.     Breath sounds: Normal breath sounds.  Abdominal:     General: Abdomen is flat. Bowel sounds are normal.     Palpations: Abdomen is soft.     Tenderness: There is no abdominal tenderness. There is no guarding or rebound.  Musculoskeletal:        General: Normal range of motion.     Cervical back: Normal range of motion and neck supple.  Skin:    General: Skin is warm and dry.     Capillary Refill: Capillary refill takes less than 2 seconds.  Neurological:     General: No focal deficit present.     Mental Status: She is alert and oriented to person, place, and time.     Deep Tendon Reflexes: Reflexes normal.  Psychiatric:        Mood and Affect: Mood normal.        Behavior: Behavior normal.    ED Results / Procedures / Treatments   Labs (all labs ordered are listed, but only abnormal results are displayed) Results for orders placed or performed during the hospital encounter of 01/02/21  CBC with  Differential  Result Value Ref Range   WBC 7.5 4.0 - 10.5 K/uL   RBC 5.19 (H) 3.87 - 5.11 MIL/uL   Hemoglobin 13.5 12.0 - 15.0 g/dL   HCT 44.9 36.0 - 46.0 %   MCV 86.5 80.0 - 100.0 fL   MCH 26.0 26.0 - 34.0 pg   MCHC 30.1 30.0 - 36.0 g/dL   RDW 14.8 11.5 - 15.5 %   Platelets 401 (H) 150 - 400 K/uL   nRBC 0.0 0.0 - 0.2 %   Neutrophils Relative % 68 %   Neutro Abs 5.1 1.7 - 7.7 K/uL   Lymphocytes Relative 21 %   Lymphs Abs 1.6 0.7 - 4.0 K/uL   Monocytes Relative 11 %   Monocytes Absolute 0.8 0.1 - 1.0 K/uL   Eosinophils Relative 0 %   Eosinophils Absolute 0.0 0.0 - 0.5 K/uL   Basophils Relative 0 %   Basophils Absolute 0.0 0.0 - 0.1 K/uL   Immature Granulocytes 0 %   Abs Immature Granulocytes 0.01 0.00 - 0.07 K/uL  Comprehensive metabolic panel  Result Value Ref Range   Sodium 138 135 - 145 mmol/L   Potassium 3.7 3.5 - 5.1 mmol/L   Chloride 90 (L) 98 - 111 mmol/L   CO2 33 (H) 22 - 32 mmol/L   Glucose, Bld 235 (H) 70 - 99 mg/dL   BUN 19 6 - 20 mg/dL   Creatinine, Ser 1.21 (H) 0.44 - 1.00 mg/dL   Calcium 9.3 8.9 - 10.3 mg/dL   Total Protein 9.0 (H) 6.5 - 8.1 g/dL   Albumin 4.3 3.5 - 5.0 g/dL   AST 29 15 - 41 U/L   ALT 37 0 - 44 U/L   Alkaline Phosphatase 69 38 - 126 U/L   Total Bilirubin 0.7 0.3 - 1.2 mg/dL   GFR, Estimated >60 >60 mL/min   Anion gap 15 5 - 15  Lipase, blood  Result Value Ref Range   Lipase 36 11 - 51 U/L  I-Stat Beta hCG blood, ED ([facility], [facility], [facility] only)  Result Value Ref Range   I-stat hCG, quantitative <5.0 <5 mIU/mL   Comment 3  DG Chest 2 View  Result Date: 12/16/2020 CLINICAL DATA:  Shortness of breath EXAM: CHEST - 2 VIEW COMPARISON:  10/03/2020 FINDINGS: Mild cardiomegaly with vascular congestion. No focal opacity or pleural effusion. No pneumothorax. IMPRESSION: Mild cardiomegaly with vascular congestion Electronically Signed   By: Donavan Foil M.D.   On: 12/16/2020 23:38   CT Renal Stone Study  Result Date: 01/03/2021 CLINICAL DATA:   Nausea vomiting abdominal pain EXAM: CT ABDOMEN AND PELVIS WITHOUT CONTRAST TECHNIQUE: Multidetector CT imaging of the abdomen and pelvis was performed following the standard protocol without IV contrast. COMPARISON:  CT 09/17/2020 FINDINGS: Lower chest: No acute abnormality. Hepatobiliary: No focal liver abnormality is seen. Status post cholecystectomy. No biliary dilatation. Pancreas: Unremarkable. No pancreatic ductal dilatation or surrounding inflammatory changes. Spleen: Normal in size without focal abnormality. Adrenals/Urinary Tract: Adrenal glands are unremarkable. Kidneys are normal, without renal calculi, focal lesion, or hydronephrosis. Bladder is unremarkable. Stomach/Bowel: Stomach is within normal limits. History of appendectomy. No evidence of bowel wall thickening, distention, or inflammatory changes. Vascular/Lymphatic: No significant vascular findings are present. No enlarged abdominal or pelvic lymph nodes. Reproductive: Uterus and bilateral adnexa are unremarkable. Other: No abdominal wall hernia or abnormality. No abdominopelvic ascites. Musculoskeletal: Chronic bilateral pars defect at L5. IMPRESSION: Negative. No CT evidence for acute intra-abdominal or pelvic abnormality. Electronically Signed   By: Donavan Foil M.D.   On: 01/03/2021 02:28     Radiology CT Renal Stone Study  Result Date: 01/03/2021 CLINICAL DATA:  Nausea vomiting abdominal pain EXAM: CT ABDOMEN AND PELVIS WITHOUT CONTRAST TECHNIQUE: Multidetector CT imaging of the abdomen and pelvis was performed following the standard protocol without IV contrast. COMPARISON:  CT 09/17/2020 FINDINGS: Lower chest: No acute abnormality. Hepatobiliary: No focal liver abnormality is seen. Status post cholecystectomy. No biliary dilatation. Pancreas: Unremarkable. No pancreatic ductal dilatation or surrounding inflammatory changes. Spleen: Normal in size without focal abnormality. Adrenals/Urinary Tract: Adrenal glands are unremarkable.  Kidneys are normal, without renal calculi, focal lesion, or hydronephrosis. Bladder is unremarkable. Stomach/Bowel: Stomach is within normal limits. History of appendectomy. No evidence of bowel wall thickening, distention, or inflammatory changes. Vascular/Lymphatic: No significant vascular findings are present. No enlarged abdominal or pelvic lymph nodes. Reproductive: Uterus and bilateral adnexa are unremarkable. Other: No abdominal wall hernia or abnormality. No abdominopelvic ascites. Musculoskeletal: Chronic bilateral pars defect at L5. IMPRESSION: Negative. No CT evidence for acute intra-abdominal or pelvic abnormality. Electronically Signed   By: Donavan Foil M.D.   On: 01/03/2021 02:28    Procedures Procedures   Medications Ordered in ED Medications  alum & mag hydroxide-simeth (MAALOX/MYLANTA) 200-200-20 MG/5ML suspension 30 mL (30 mLs Oral Not Given 01/03/21 0226)  ondansetron (ZOFRAN-ODT) disintegrating tablet 8 mg (8 mg Oral Given 01/03/21 0224)  dicyclomine (BENTYL) injection 20 mg (20 mg Intramuscular Given 01/03/21 0226)    ED Course  I have reviewed the triage vital signs and the nursing notes.  Pertinent labs & imaging results that were available during my care of the patient were reviewed by me and considered in my medical decision making (see chart for details).   Well appearing, normal labs and imaging.  COVID swab is pending.  Likely viral etiology of symptoms.  Bland diet instructions given.  Stable for discharge with close follow up.    Yvette Gibbs was evaluated in Emergency Department on 01/03/2021 for the symptoms described in the history of present illness. She was evaluated in the context of the global COVID-19 pandemic, which necessitated consideration that the patient  might be at risk for infection with the SARS-CoV-2 virus that causes COVID-19. Institutional protocols and algorithms that pertain to the evaluation of patients at risk for COVID-19 are in a state of  rapid change based on information released by regulatory bodies including the CDC and federal and state organizations. These policies and algorithms were followed during the patient's care in the ED.  Final Clinical Impression(s) / ED Diagnoses Final diagnoses:  None       Return for intractable cough, coughing up blood, fevers > 100.4 unrelieved by medication, shortness of breath, intractable vomiting, chest pain, shortness of breath, weakness, numbness, changes in speech, facial asymmetry, abdominal pain, passing out, Inability to tolerate liquids or food, cough, altered mental status or any concerns. No signs of systemic illness or infection. The patient is nontoxic-appearing on exam and vital signs are within normal limits. I have reviewed the triage vital signs and the nursing notes. Pertinent labs & imaging results that were available during my care of the patient were reviewed by me and considered in my medical decision making (see chart for details). After history, exam, and medical workup I feel the patient has been appropriately medically screened and is safe for discharge home. Pertinent diagnoses were discussed with the patient. Patient was given return precautions. Rx / DC Orders ED Discharge Orders     None        Yessika Otte, MD 01/03/21 [phone]

## 2021-01-03 NOTE — ED Notes (Signed)
Patient transported to CT 

## 2021-01-13 ENCOUNTER — Other Ambulatory Visit: Payer: Self-pay

## 2021-01-13 ENCOUNTER — Ambulatory Visit (INDEPENDENT_AMBULATORY_CARE_PROVIDER_SITE_OTHER): Payer: Medicaid Other | Admitting: Pulmonary Disease

## 2021-01-13 ENCOUNTER — Ambulatory Visit: Payer: Medicaid Other

## 2021-01-13 DIAGNOSIS — G4733 Obstructive sleep apnea (adult) (pediatric): Secondary | ICD-10-CM

## 2021-01-13 DIAGNOSIS — R0600 Dyspnea, unspecified: Secondary | ICD-10-CM

## 2021-01-13 DIAGNOSIS — R0683 Snoring: Secondary | ICD-10-CM

## 2021-01-13 DIAGNOSIS — Z419 Encounter for procedure for purposes other than remedying health state, unspecified: Secondary | ICD-10-CM | POA: Diagnosis not present

## 2021-01-13 DIAGNOSIS — R0609 Other forms of dyspnea: Secondary | ICD-10-CM

## 2021-01-13 LAB — PULMONARY FUNCTION TEST
DL/VA % pred: 108 %
DL/VA: 5.1 ml/min/mmHg/L
DLCO cor % pred: 80 %
DLCO cor: 16.83 ml/min/mmHg
DLCO unc % pred: 80 %
DLCO unc: 16.88 ml/min/mmHg
FEF 25-75 Post: 2.06 L/sec
FEF 25-75 Pre: 2.23 L/sec
FEF2575-%Change-Post: -7 %
FEF2575-%Pred-Post: 65 %
FEF2575-%Pred-Pre: 70 %
FEV1-%Change-Post: -1 %
FEV1-%Pred-Post: 66 %
FEV1-%Pred-Pre: 67 %
FEV1-Post: 1.72 L
FEV1-Pre: 1.75 L
FEV1FVC-%Change-Post: 4 %
FEV1FVC-%Pred-Pre: 98 %
FEV6-%Change-Post: -6 %
FEV6-%Pred-Post: 64 %
FEV6-%Pred-Pre: 69 %
FEV6-Post: 1.93 L
FEV6-Pre: 2.06 L
FEV6FVC-%Pred-Post: 101 %
FEV6FVC-%Pred-Pre: 101 %
FVC-%Change-Post: -6 %
FVC-%Pred-Post: 64 %
FVC-%Pred-Pre: 68 %
FVC-Post: 1.93 L
FVC-Pre: 2.06 L
Post FEV1/FVC ratio: 89 %
Post FEV6/FVC ratio: 100 %
Pre FEV1/FVC ratio: 85 %
Pre FEV6/FVC Ratio: 100 %
RV % pred: 100 %
RV: 1.25 L
TLC % pred: 73 %
TLC: 3.5 L

## 2021-01-13 NOTE — Progress Notes (Signed)
PFT done today. 

## 2021-01-15 ENCOUNTER — Telehealth: Payer: Self-pay | Admitting: Pulmonary Disease

## 2021-01-15 NOTE — Telephone Encounter (Signed)
Called and spoke with patient to go over HST results per Dr. Craige Cotta. She expressed understanding. She is now scheduled for a follow up on 01/28/21 with Ames Dura. Nothing further needed at this time.

## 2021-01-15 NOTE — Telephone Encounter (Signed)
HST 01/13/21 >> AHI 26.7, SpO2 low 70%.  Spent 248.4 min with SpO2 < 89%.  Please inform her that her sleep study shows moderate obstructive sleep apnea.  Please arrange for ROV with me or NP to discuss treatment options.

## 2021-01-17 ENCOUNTER — Other Ambulatory Visit: Payer: Self-pay | Admitting: Pulmonary Disease

## 2021-01-17 DIAGNOSIS — G4733 Obstructive sleep apnea (adult) (pediatric): Secondary | ICD-10-CM | POA: Diagnosis not present

## 2021-01-20 DIAGNOSIS — D649 Anemia, unspecified: Secondary | ICD-10-CM | POA: Diagnosis not present

## 2021-01-20 DIAGNOSIS — E662 Morbid (severe) obesity with alveolar hypoventilation: Secondary | ICD-10-CM | POA: Diagnosis not present

## 2021-01-20 DIAGNOSIS — J45901 Unspecified asthma with (acute) exacerbation: Secondary | ICD-10-CM | POA: Diagnosis not present

## 2021-01-20 DIAGNOSIS — J9601 Acute respiratory failure with hypoxia: Secondary | ICD-10-CM | POA: Diagnosis not present

## 2021-01-22 ENCOUNTER — Other Ambulatory Visit: Payer: Self-pay | Admitting: Student

## 2021-01-24 DIAGNOSIS — J45901 Unspecified asthma with (acute) exacerbation: Secondary | ICD-10-CM | POA: Diagnosis not present

## 2021-01-24 DIAGNOSIS — J9601 Acute respiratory failure with hypoxia: Secondary | ICD-10-CM | POA: Diagnosis not present

## 2021-01-24 DIAGNOSIS — E662 Morbid (severe) obesity with alveolar hypoventilation: Secondary | ICD-10-CM | POA: Diagnosis not present

## 2021-01-24 DIAGNOSIS — D649 Anemia, unspecified: Secondary | ICD-10-CM | POA: Diagnosis not present

## 2021-01-28 ENCOUNTER — Ambulatory Visit: Payer: Medicaid Other | Admitting: Primary Care

## 2021-02-03 ENCOUNTER — Other Ambulatory Visit: Payer: Self-pay | Admitting: Pulmonary Disease

## 2021-02-03 NOTE — Telephone Encounter (Signed)
Would you like to refill both of these?

## 2021-02-06 ENCOUNTER — Telehealth: Payer: Self-pay | Admitting: Pulmonary Disease

## 2021-02-06 NOTE — Telephone Encounter (Signed)
Call made to patient, confirmed DOB. Made aware of Hst results. Appt made.   Patient wanted to make the visit a mychart video visit as she does not have transportation and is having a hard time economically. Information given to sign up for mychart. Voiced understanding.   Nothing further needed at this time.

## 2021-02-13 DIAGNOSIS — Z419 Encounter for procedure for purposes other than remedying health state, unspecified: Secondary | ICD-10-CM | POA: Diagnosis not present

## 2021-02-18 ENCOUNTER — Encounter: Payer: Self-pay | Admitting: Primary Care

## 2021-02-18 ENCOUNTER — Telehealth (INDEPENDENT_AMBULATORY_CARE_PROVIDER_SITE_OTHER): Payer: Medicaid Other | Admitting: Primary Care

## 2021-02-18 DIAGNOSIS — G4733 Obstructive sleep apnea (adult) (pediatric): Secondary | ICD-10-CM

## 2021-02-18 NOTE — Patient Instructions (Signed)
Home sleep study 01/13/21 showed moderate obstructive sleep apnea >> average 26.7 events an hours with O2 low 70%  - Recommend patient be started on PAP therapy. She will need in-lab titration study to determine if she needs supplemental oxygen

## 2021-02-18 NOTE — Progress Notes (Signed)
I called at 10:20 am to start the visit and left a VM for patient to call back. I let her know that I would call back in a few minutes.

## 2021-02-18 NOTE — Progress Notes (Signed)
Virtual Visit via Video Note  I connected with Yvette Gibbs on 02/18/21 at 10:30 AM EDT by a video enabled telemedicine application and verified that I am speaking with the correct person using two identifiers.  Location: Patient: Home Provider: Office    I discussed the limitations of evaluation and management by telemedicine and the availability of in person appointments. The patient expressed understanding and agreed to proceed.  History of Present Illness: 30 year old female, never smoked. PMH significant for HTN, LVH, heart failure, asthma, obstructive lung disease, OSA, type 2 diabetes, GERD, morbid obesity.  Previous [facility] pulmonary encounter: 12/25/20- Consult She was in hospital earlier this month for dyspnea.  Started on Bipap.  Concern for sleep apnea.  She has snoring, sleep disruption, apnea, and daytime sleepiness.  Did well with Bipap in hospital.  She lives with her grandmother.  Her grandmother moved while she was in hospital, and she lost several of her medications.  She had spirometry before, but test results aren't accurate.  She hasn't been using albuterol.  Not having cough, wheeze, sputum, or chest congestion.  She has appointment with weight loss surgery program set up.  02/18/2021- interim hx Patient contacted today for virtual visit to review sleep study results.  Patient has symptoms of snoring, sleep disruption, apnea and daytime sleepiness. She has a history of HTN. Home sleep study on 01/13/2021 showed moderate obstructive sleep apnea, AHI 26.7/hr with SPO2 low 70%.  Sleep study results and treatment options including weight loss, oral appliance, CPAP therapy or referral to ear nose and throat surgical options.  Due to severity of her sleep apnea and moderate to severe hypoxia recommend patient be started on Pap therapy.  She currently has a CPAP machine and reports benefit from use. Unclear on current pressure settings. She will need in lab CPAP titration study to  determine if she needs supplemental oxygen in addition to Pap therapy.  She is considering bariatric weight loss surgery.    Observations/Objective:  - Appear well, laying in bed d/t ankle injury. No overt shortness of breath, wheezing or cough  Assessment and Plan:  Moderate obstructive sleep apnea - Home sleep study 01/13/21 >> average AHI 26.7/hr spO2 low 70% - Recommend patient be started on PAP therapy. She will need in-lab titration study to determine if she needs supplemental oxygen  Dyspnea: - PFTs on 01/13/21 showed mild restrictive defect without BD response or overt obstruction  - Encourage weight loss efforts, she is considering bariatric surgery    Follow Up Instructions:   2-3 months for compliance check with Dr. Craige Cotta or APP  I discussed the assessment and treatment plan with the patient. The patient was provided an opportunity to ask questions and all were answered. The patient agreed with the plan and demonstrated an understanding of the instructions.   The patient was advised to call back or seek an in-person evaluation if the symptoms worsen or if the condition fails to improve as anticipated.  I provided 25 minutes of non-face-to-face time during this encounter.   Glenford Bayley, NP

## 2021-02-19 NOTE — Progress Notes (Signed)
Reviewed and agree with assessment/plan.   Husein Guedes, MD [facility] 02/19/2021, 8:48 AM Pager:  [phone]  

## 2021-02-20 ENCOUNTER — Emergency Department (HOSPITAL_BASED_OUTPATIENT_CLINIC_OR_DEPARTMENT_OTHER): Payer: Medicaid Other

## 2021-02-20 ENCOUNTER — Encounter (HOSPITAL_BASED_OUTPATIENT_CLINIC_OR_DEPARTMENT_OTHER): Payer: Self-pay

## 2021-02-20 ENCOUNTER — Other Ambulatory Visit: Payer: Self-pay

## 2021-02-20 ENCOUNTER — Emergency Department (HOSPITAL_BASED_OUTPATIENT_CLINIC_OR_DEPARTMENT_OTHER)
Admission: EM | Admit: 2021-02-20 | Discharge: 2021-02-20 | Disposition: A | Discharge status: Home / Self Care | Payer: Medicaid Other | Attending: Emergency Medicine | Admitting: Emergency Medicine

## 2021-02-20 DIAGNOSIS — Z7951 Long term (current) use of inhaled steroids: Secondary | ICD-10-CM | POA: Diagnosis not present

## 2021-02-20 DIAGNOSIS — D649 Anemia, unspecified: Secondary | ICD-10-CM | POA: Diagnosis not present

## 2021-02-20 DIAGNOSIS — R739 Hyperglycemia, unspecified: Secondary | ICD-10-CM

## 2021-02-20 DIAGNOSIS — I10 Essential (primary) hypertension: Secondary | ICD-10-CM | POA: Diagnosis not present

## 2021-02-20 DIAGNOSIS — Z79899 Other long term (current) drug therapy: Secondary | ICD-10-CM | POA: Insufficient documentation

## 2021-02-20 DIAGNOSIS — Z7984 Long term (current) use of oral hypoglycemic drugs: Secondary | ICD-10-CM | POA: Insufficient documentation

## 2021-02-20 DIAGNOSIS — J9601 Acute respiratory failure with hypoxia: Secondary | ICD-10-CM | POA: Diagnosis not present

## 2021-02-20 DIAGNOSIS — E1165 Type 2 diabetes mellitus with hyperglycemia: Secondary | ICD-10-CM | POA: Insufficient documentation

## 2021-02-20 DIAGNOSIS — J45901 Unspecified asthma with (acute) exacerbation: Secondary | ICD-10-CM | POA: Insufficient documentation

## 2021-02-20 DIAGNOSIS — H6122 Impacted cerumen, left ear: Secondary | ICD-10-CM | POA: Diagnosis not present

## 2021-02-20 DIAGNOSIS — R221 Localized swelling, mass and lump, neck: Secondary | ICD-10-CM | POA: Insufficient documentation

## 2021-02-20 DIAGNOSIS — Z8616 Personal history of COVID-19: Secondary | ICD-10-CM | POA: Diagnosis not present

## 2021-02-20 DIAGNOSIS — L0211 Cutaneous abscess of neck: Secondary | ICD-10-CM | POA: Diagnosis not present

## 2021-02-20 DIAGNOSIS — H60312 Diffuse otitis externa, left ear: Secondary | ICD-10-CM | POA: Diagnosis not present

## 2021-02-20 DIAGNOSIS — M542 Cervicalgia: Secondary | ICD-10-CM | POA: Insufficient documentation

## 2021-02-20 DIAGNOSIS — H9202 Otalgia, left ear: Secondary | ICD-10-CM | POA: Diagnosis present

## 2021-02-20 DIAGNOSIS — Z794 Long term (current) use of insulin: Secondary | ICD-10-CM | POA: Diagnosis not present

## 2021-02-20 DIAGNOSIS — H9209 Otalgia, unspecified ear: Secondary | ICD-10-CM | POA: Diagnosis not present

## 2021-02-20 DIAGNOSIS — Z743 Need for continuous supervision: Secondary | ICD-10-CM | POA: Diagnosis not present

## 2021-02-20 DIAGNOSIS — E662 Morbid (severe) obesity with alveolar hypoventilation: Secondary | ICD-10-CM | POA: Diagnosis not present

## 2021-02-20 DIAGNOSIS — R030 Elevated blood-pressure reading, without diagnosis of hypertension: Secondary | ICD-10-CM

## 2021-02-20 DIAGNOSIS — H7090 Unspecified mastoiditis, unspecified ear: Secondary | ICD-10-CM | POA: Diagnosis not present

## 2021-02-20 LAB — CBC WITH DIFFERENTIAL/PLATELET
Abs Immature Granulocytes: 0.02 10*3/uL (ref 0.00–0.07)
Basophils Absolute: 0 10*3/uL (ref 0.0–0.1)
Basophils Relative: 0 %
Eosinophils Absolute: 0.1 10*3/uL (ref 0.0–0.5)
Eosinophils Relative: 3 %
HCT: 36.5 % (ref 36.0–46.0)
Hemoglobin: 11 g/dL — ABNORMAL LOW (ref 12.0–15.0)
Immature Granulocytes: 0 %
Lymphocytes Relative: 23 %
Lymphs Abs: 1.2 10*3/uL (ref 0.7–4.0)
MCH: 26.5 pg (ref 26.0–34.0)
MCHC: 30.1 g/dL (ref 30.0–36.0)
MCV: 88 fL (ref 80.0–100.0)
Monocytes Absolute: 0.6 10*3/uL (ref 0.1–1.0)
Monocytes Relative: 12 %
Neutro Abs: 3.1 10*3/uL (ref 1.7–7.7)
Neutrophils Relative %: 62 %
Platelets: 243 10*3/uL (ref 150–400)
RBC: 4.15 MIL/uL (ref 3.87–5.11)
RDW: 15.2 % (ref 11.5–15.5)
WBC: 5 10*3/uL (ref 4.0–10.5)
nRBC: 0 % (ref 0.0–0.2)

## 2021-02-20 LAB — COMPREHENSIVE METABOLIC PANEL
ALT: 39 U/L (ref 0–44)
AST: 42 U/L — ABNORMAL HIGH (ref 15–41)
Albumin: 3.9 g/dL (ref 3.5–5.0)
Alkaline Phosphatase: 61 U/L (ref 38–126)
Anion gap: 8 (ref 5–15)
BUN: 9 mg/dL (ref 6–20)
CO2: 29 mmol/L (ref 22–32)
Calcium: 9.4 mg/dL (ref 8.9–10.3)
Chloride: 100 mmol/L (ref 98–111)
Creatinine, Ser: 0.64 mg/dL (ref 0.44–1.00)
GFR, Estimated: >60 mL/min (ref >60)
Glucose, Bld: 293 mg/dL — ABNORMAL HIGH (ref 70–99)
Potassium: 4.2 mmol/L (ref 3.5–5.1)
Sodium: 137 mmol/L (ref 135–145)
Total Bilirubin: 0.3 mg/dL (ref 0.3–1.2)
Total Protein: 7.4 g/dL (ref 6.5–8.1)

## 2021-02-20 LAB — LACTIC ACID, PLASMA: Lactic Acid, Venous: 1.5 mmol/L (ref 0.5–1.9)

## 2021-02-20 LAB — CBG MONITORING, ED: Glucose-Capillary: 270 mg/dL — ABNORMAL HIGH (ref 70–99)

## 2021-02-20 LAB — PREGNANCY, URINE: Preg Test, Ur: NEGATIVE

## 2021-02-20 MED ORDER — INSULIN GLARGINE 100 UNITS/ML SOLOSTAR PEN: 45.0000 [IU] | PEN_INJECTOR | Freq: Every day | SUBCUTANEOUS | 0 refills | Status: DC

## 2021-02-20 MED ORDER — IOHEXOL 350 MG/ML SOLN
75.0000 mL | Freq: Once | INTRAVENOUS | Status: AC | PRN
  Administered 2021-02-20: 100 mL via INTRAVENOUS

## 2021-02-20 MED ORDER — ACETAMINOPHEN 325 MG PO TABS
650.0000 mg | ORAL_TABLET | Freq: Once | ORAL | Status: AC
  Administered 2021-02-20: 650 mg via ORAL
  Filled 2021-02-20: qty 2

## 2021-02-20 MED ORDER — CLINDAMYCIN HCL 150 MG PO CAPS
300.0000 mg | ORAL_CAPSULE | Freq: Once | ORAL | Status: AC
  Administered 2021-02-20: 300 mg via ORAL
  Filled 2021-02-20: qty 2

## 2021-02-20 MED ORDER — CIPROFLOXACIN HCL 500 MG PO TABS: 500.0000 mg | ORAL_TABLET | Freq: Two times a day (BID) | ORAL | 0 refills | Status: AC

## 2021-02-20 MED ORDER — CIPROFLOXACIN HCL 500 MG PO TABS
500.0000 mg | ORAL_TABLET | Freq: Once | ORAL | Status: AC
  Administered 2021-02-20: 500 mg via ORAL
  Filled 2021-02-20: qty 1

## 2021-02-20 MED ORDER — CLINDAMYCIN HCL 150 MG PO CAPS: 300.0000 mg | ORAL_CAPSULE | Freq: Four times a day (QID) | ORAL | 0 refills | Status: AC

## 2021-02-20 MED ORDER — "PEN NEEDLES 1/2"" 29G X 12MM MISC": 0 refills | Status: DC

## 2021-02-20 NOTE — ED Notes (Signed)
Pt's CBG result was 270. Informed Allen - RN. 

## 2021-02-20 NOTE — ED Notes (Signed)
Patient placed on 2 L. Patient with hx of sleep apnea.

## 2021-02-20 NOTE — Discharge Instructions (Addendum)
Today your CT scan showed concern for infection. Please take your antibiotics.  We discussed risks of these antibiotics include tendon injury, other tissue injury, and C. difficile however given the known infection we still would recommend treatment with these agents as it is most effective. I have given you a prescription for your Lantus.  Please make sure you get this and are taking it daily.  It is very important that you schedule a follow-up appointment with your primary care doctor.  If your blood pressure and blood sugar are usually this high then you need to be on medicines as it is causing damage.  Please schedule a follow-up appointment with Dr. Suszanne Conners for further evaluation.  If you develop fevers, have swelling into your neck, or have difficulty swallowing, breathing, or any other concerns please seek additional medical care and evaluation. Additionally please make sure you are eating healthy and avoiding high sugar foods to help your infection heal.  DO NOT PUT Q-TIPS INTO YOUR EARS!!!!!!!   You may have diarrhea from the antibiotics.  It is very important that you continue to take the antibiotics even if you get diarrhea unless a medical professional tells you that you may stop taking them.  If you stop too early the bacteria you are being treated for will become stronger and you may need different, more powerful antibiotics that have more side effects and worsening diarrhea.  Please stay well hydrated and consider probiotics as they may decrease the severity of your diarrhea.  Please be aware that if you take any hormonal contraception (birth control pills, nexplanon, the ring, etc) that your birth control will not work while you are taking antibiotics and you need to use back up protection as directed on the birth control medication information insert.    IMPRESSION:  1. Thickening of the cartilaginous left EAC and possible edema of  the pinna, nonspecific but possibly external  otitis/cellulitis given  the clinical history. Findings are amenable to direct inspection. No  discrete drainable fluid collection. No mastoid or middle ear  effusions or bony erosive change  2. Rounded 9 mm lesion in the superficial left parotid gland may  represent an intraparotid lymph node (particularly given the above  findings); however, a primary parotid neoplasm (benign or malignant)  could have a similar appearance. Recommend nonurgent ENT follow-up  for management.

## 2021-02-20 NOTE — ED Provider Notes (Signed)
[facility] EMERGENCY DEPT Provider Note   CSN: [id number] Arrival date & time: 02/20/21  1321     History Chief Complaint  Patient presents with   Ear Injury    Two weeks ago noted wound to outside of ear.     Yvette Gibbs is a 30 y.o. female with a past medical history of HF PR AF, DM 2 has not checked sugar since June, BMI of 70, obesity hypoventilation syndrome, hypertension, GERD, who presents today for evaluation of ear pain and neck swelling.  She states that about 2 weeks ago she started having pain in her ear.  This has worsened significantly since the weekend.  She states that she was itching her ear and scratched indicating the antitragus.  She since then developed swelling down into the left side of her neck with pain.  She denies any fevers.  She states that she does frequently clean her ears with Q-tips and peroxide.    Her pain is worsened with touch.    HPI     Past Medical History:  Diagnosis Date   Asthma    Depression    Diabetes mellitus type 2, uncontrolled (HCC)    Extreme obesity    Family history of adverse reaction to anesthesia    " my grandmother had an allergic reaction   Hypertension    Mental disorder    Obesity    OSA (obstructive sleep apnea)     Patient Active Problem List   Diagnosis Date Noted   Acute asthma exacerbation 12/17/2020   Obstructive lung disease (HCC) 12/17/2020   Acute on chronic respiratory failure with hypoxia and hypercapnia (HCC) 12/17/2020   Heart failure with preserved ejection fraction (HCC) 10/01/2020   Obesity hypoventilation syndrome (HCC) 09/29/2020   Acute respiratory failure with hypoxia (HCC) 09/29/2020   Normocytic anemia 09/29/2020   Mild concentric left ventricular hypertrophy (LVH) 09/29/2020   NAFLD (nonalcoholic fatty liver disease) 40/98/1191   Asthma exacerbation 09/28/2020   Acute respiratory failure due to COVID-19 (HCC) 05/21/2020   Gastroesophageal reflux disease without  esophagitis 06/19/2019   Loud snoring 06/19/2019   Type 2 diabetes mellitus without complication, without long-term current use of insulin (HCC) 03/04/2019   Abdominal pain 03/03/2019   HTN (hypertension) 03/03/2019   Hyperglycemia 03/03/2019   Proteinuria 03/03/2019   HCAP (healthcare-associated pneumonia) 09/07/2015   Severe sepsis (HCC) 09/07/2015   Asthma 09/07/2015   Pleuritic chest pain 09/01/2015   Morbid obesity (HCC) 09/01/2015   OSA (obstructive sleep apnea) 09/01/2015   Influenza A 09/01/2015   Biliary colic 09/04/2014    Past Surgical History:  Procedure Laterality Date   APPENDECTOMY     CHOLECYSTECTOMY N/A 09/05/2014   Procedure: LAPAROSCOPIC CHOLECYSTECTOMY;  Surgeon: Axel Filler, MD;  Location: [facility] OR;  Service: General;  Laterality: N/A;   TONSILLECTOMY       OB History   No obstetric history on file.     Family History  Problem Relation Age of Onset   Diabetes Mellitus II Maternal Grandmother     Social History   Tobacco Use   Smoking status: Never   Smokeless tobacco: Never  Vaping Use   Vaping Use: Never used  Substance Use Topics   Alcohol use: Not Currently   Drug use: Not Currently    Types: Marijuana    Home Medications Prior to Admission medications   Medication Sig Start Date End Date Taking? Authorizing Provider  albuterol (VENTOLIN HFA) 108 (90 Base) MCG/ACT inhaler Inhale 1-2 puffs  into the lungs every 6 (six) hours as needed for wheezing or shortness of breath. 03/21/19  Yes Marcine MatarJohnson, Deborah B, MD  ciprofloxacin (CIPRO) 500 MG tablet Take 1 tablet (500 mg total) by mouth every 12 (twelve) hours for 7 days. 02/20/21 02/27/21 Yes Cristina GongHammond, Korrey Schleicher W, PA-C  clindamycin (CLEOCIN) 150 MG capsule Take 2 capsules (300 mg total) by mouth 4 (four) times daily for 7 days. 02/20/21 02/27/21 Yes Cristina GongHammond, Myrella Fahs W, PA-C  Dulaglutide (TRULICITY) 0.75 MG/0.5ML SOPN Inject 0.75 mg into the skin once a week. 10/24/20  Yes Grayce SessionsEdwards, Michelle P, NP   Accu-Chek Softclix Lancets lancets Use as instructed 10/08/20   Alphonzo SeveranceWatson, Julia, MD  famotidine (PEPCID) 20 MG tablet TAKE 1 TABLET BY MOUTH TWICE A DAY 01/20/21   Coralyn HellingSood, Vineet, MD  insulin glargine (LANTUS) 100 unit/mL SOPN Inject 45 Units into the skin daily. 02/20/21   Cristina GongHammond, Maveryck Bahri W, PA-C  Insulin Pen Needle (PEN NEEDLES 29GX1/2") 29G X 12MM MISC Use as directed 02/20/21   Cristina GongHammond, Braedyn Riggle W, PA-C  metFORMIN (GLUCOPHAGE) 1000 MG tablet Take 1 tablet (1,000 mg total) by mouth 2 (two) times daily with a meal. 10/24/20   Grayce SessionsEdwards, Michelle P, NP  ondansetron (ZOFRAN ODT) 8 MG disintegrating tablet 8mg  ODT q8 hours prn nausea 01/03/21   Palumbo, April, MD  pantoprazole (PROTONIX) 40 MG tablet TAKE 1 TABLET BY MOUTH EVERY DAY 01/20/21   Coralyn HellingSood, Vineet, MD  torsemide 40 MG TABS Take 40 mg by mouth 2 (two) times daily. 10/08/20   Alphonzo SeveranceWatson, Julia, MD    Allergies    Bee pollen, Fish allergy, and Lisinopril  Review of Systems   Review of Systems  Constitutional:  Negative for chills, fatigue and fever.  HENT:  Positive for ear discharge and ear pain. Negative for congestion, facial swelling and hearing loss.   Respiratory:  Negative for cough and shortness of breath.   Gastrointestinal:  Negative for abdominal pain and nausea.  Musculoskeletal:  Negative for back pain and neck pain.  Skin:  Negative for color change.  Neurological:  Positive for headaches. Negative for weakness.  Psychiatric/Behavioral:  Negative for confusion.   All other systems reviewed and are negative.  Physical Exam Updated Vital Signs BP 124/76 (BP Location: Right Arm)   Pulse 91   Temp 98.4 F (36.9 C) (Oral)   Resp 16   Ht 5\' 2"  (1.575 m)   Wt (!) 180.1 kg   LMP 01/27/2021   SpO2 99%   BMI 72.61 kg/m   Physical Exam Vitals and nursing note reviewed.  Constitutional:      General: She is not in acute distress.    Appearance: She is not diaphoretic.     Comments: Exam limited by body habitus   HENT:     Head:  Normocephalic and atraumatic.     Ears:     Comments: Left ear with two superficial abrasions over the antitragus with questionable edema in this area and tenderness to palpation.  There is tenderness over the left mastoid.  There is a palpable swelling extending midway down the neck on the left side.  This is tender to the touch.  Left ear canal is occluded by cerumen and edema.  After attempts at cerumen removal still unable to visualize TM. Eyes:     General: No scleral icterus.       Right eye: No discharge.        Left eye: No discharge.     Conjunctiva/sclera: Conjunctivae normal.  Cardiovascular:     Rate and Rhythm: Normal rate and regular rhythm.  Pulmonary:     Effort: Pulmonary effort is normal. No respiratory distress.     Breath sounds: No stridor.  Abdominal:     General: There is no distension.  Musculoskeletal:        General: No deformity.     Cervical back: Normal range of motion.  Skin:    General: Skin is warm and dry.  Neurological:     Mental Status: She is alert and oriented to person, place, and time.     Motor: No abnormal muscle tone.  Psychiatric:        Behavior: Behavior normal.    ED Results / Procedures / Treatments   Labs (all labs ordered are listed, but only abnormal results are displayed) Labs Reviewed  COMPREHENSIVE METABOLIC PANEL - Abnormal; Notable for the following components:      Result Value   Glucose, Bld 293 (*)    AST 42 (*)    All other components within normal limits  CBC WITH DIFFERENTIAL/PLATELET - Abnormal; Notable for the following components:   Hemoglobin 11.0 (*)    All other components within normal limits  CBG MONITORING, ED - Abnormal; Notable for the following components:   Glucose-Capillary 270 (*)    All other components within normal limits  PREGNANCY, URINE  LACTIC ACID, PLASMA    EKG None  Radiology CT Soft Tissue Neck W Contrast  Result Date: 02/20/2021 CLINICAL DATA:  Neck abscess, deep tissue EXAM: CT  NECK WITH CONTRAST TECHNIQUE: Multidetector CT imaging of the neck was performed using the standard protocol following the bolus administration of intravenous contrast. CONTRAST:  OMNIPAQUE IOHEXOL 350 MG/ML SOLN COMPARISON:  None. FINDINGS: Beam hardening artifact due to patient body habitus limits evaluation in the lower neck and upper chest. Within this limitation Pharynx and larynx: Normal. No mass or swelling. Salivary glands: No inflammation, mass, or stone. Approximately 9 mm rounded soft tissue lesion in the superficial left parotid gland (series 2, image 22; series 4, image 109). Thyroid: Poorly evaluated due to beam hardening artifact without obvious mass. Lymph nodes: No evidence of lymphadenopathy nonenlarged, mildly prominent jugulodigastric and submandibular nodes bilaterally Vascular: Essentially nondiagnostic evaluation due to non arterial timing and patient body habitus Limited intracranial: No obvious acute intracranial abnormality in the visualized brain on limited assessment/visualization. Visualized orbits: Negative. Mastoids and visualized paranasal sinuses: Thickening of the left external auditory canal, further evaluated on same day CT temporal bones. No mastoid effusions. Skeleton: Limited evaluation due to motion and beam hardening artifact without obvious acute abnormality. Upper chest: Visible lung apices are clear. IMPRESSION: Please see concurrent CT of the temporal bones for characterization of the left pinna/EAC and left parotid lesion. Otherwise, no acute abnormality in the neck. No discrete drainable fluid collection. Electronically Signed   By: Feliberto Harts M.D.   On: 02/20/2021 16:38   CT Temporal Bones W Contrast  Result Date: 02/20/2021 CLINICAL DATA:  Mastoiditis EXAM: CT TEMPORAL BONES WITH CONTRAST TECHNIQUE: Axial and coronal plane CT imaging of the petrous temporal bones was performed with thin-collimation image reconstruction following intravenous contrast  administration. Multiplanar CT image reconstructions were also generated. CONTRAST:  OMNIPAQUE IOHEXOL 350 MG/ML SOLN COMPARISON:  No pertinent prior exam. FINDINGS: RIGHT TEMPORAL BONE External auditory canal: Thickening of the cartilaginous left external auditory canal. Edema of the left pinna. Middle ear cavity: Normally aerated. The scutum and ossicles are normal. The tegmen tympani  is intact. Inner ear structures: The cochlea, vestibule and semicircular canals are normal. The vestibular aqueduct is not enlarged. Internal auditory and facial nerve canals:  Normal Mastoid air cells: Normally aerated. No osseous erosion. LEFT TEMPORAL BONE External auditory canal: Thickening of the cartilaginous left external auditory canal. Possible edema of the left pinna. Middle ear cavity: Normally aerated. The scutum and ossicles are normal. The tegmen tympani is intact. Inner ear structures: The cochlea, vestibule and semicircular canals are normal. The vestibular aqueduct is not enlarged. Internal auditory and facial nerve canals:  Normal. Mastoid air cells: Normally aerated. No osseous erosion. Vascular: Normal non-contrast appearance of the carotid canals, jugular bulbs and sigmoid plates. Limited intracranial:  No acute or significant finding. Visible orbits/paranasal sinuses: No acute or significant finding. Soft tissues: Possible mild edema of the left pinna. No discrete drainable fluid collection. Approximately 9 mm rounded soft tissue lesion in the superficial left parotid gland (see series 10, image 10; series 11, image 81). IMPRESSION: 1. Thickening of the cartilaginous left EAC and possible edema of the pinna, nonspecific but possibly external otitis/cellulitis given the clinical history. Findings are amenable to direct inspection. No discrete drainable fluid collection. No mastoid or middle ear effusions or bony erosive change 2. Rounded 9 mm lesion in the superficial left parotid gland may represent an  intraparotid lymph node (particularly given the above findings); however, a primary parotid neoplasm (benign or malignant) could have a similar appearance. Recommend nonurgent ENT follow-up for management. Electronically Signed   By: Feliberto Harts M.D.   On: 02/20/2021 16:35    Procedures .Ear Cerumen Removal  Date/Time: 02/20/2021 2:22 PM Performed by: Cristina Gong, PA-C Authorized by: Cristina Gong, PA-C   Consent:    Consent obtained:  Verbal   Consent given by:  Patient   Risks, benefits, and alternatives were discussed: yes     Risks discussed:  Bleeding, infection, pain, TM perforation, incomplete removal and dizziness   Alternatives discussed:  No treatment, alternative treatment and referral Universal protocol:    Procedure explained and questions answered to patient or proxy's satisfaction: yes   Procedure details:    Location:  L ear   Procedure type: curette     Successful cerumen removal: Moderate amount of cerumen removed, still unable to remove all or visualize TM.   Post-procedure details:    Inspection:  Some cerumen remaining and no bleeding   Post-procedure hearing quality: Unchanged.   Procedure completion:  Tolerated well, no immediate complications Comments:     Lighted curette used under direct visualization to remove wax around the external opening.  Limited wax removal due to the depth of wax and canal edema.  Additionally given suspected underlying infection did not want to aggressively remove cerumen or injure the ear canal tissues.    Medications Ordered in ED Medications  iohexol (OMNIPAQUE) 350 MG/ML injection 75 mL (100 mLs Intravenous Contrast Given 02/20/21 1547)  acetaminophen (TYLENOL) tablet 650 mg (650 mg Oral Given 02/20/21 1807)  clindamycin (CLEOCIN) capsule 300 mg (300 mg Oral Given 02/20/21 1902)  ciprofloxacin (CIPRO) tablet 500 mg (500 mg Oral Given 02/20/21 1902)    ED Course  I have reviewed the triage vital signs and the  nursing notes.  Pertinent labs & imaging results that were available during my care of the patient were reviewed by me and considered in my medical decision making (see chart for details).  Clinical Course as of 02/20/21 2159  Thu Feb 20, 2021  1610  I spoke with Dr. Jodean Lima of ENT.  He recommends starting patient on clindamycin and Cipro for 10 days.  Clindamycin is 300 mg 4 times daily.  He recommends giving patient return precautions that if this worsen she needs to go to [facility] for IV antibiotics and admission.  If it is not improving in a week or 2 then she can call him to set up outpatient follow-up. [EH]    Clinical Course User Index [EH] Norman Clay   MDM Rules/Calculators/A&P                          Patient is a 30 year old woman who presents today for evaluation of 2 weeks of worsening left ear pain and swelling. On exam I am unable to clearly visualize her TM due to edema and cerumen.  After verbal consent is obtained and cerumen removal was gently attempted with a moderate of wax removed however still has deeper wax remaining.  Removal of additional wax was not attempted as unable to obtain clear visualization of the area due to canal edema.    Patient also has palpable swelling down into her neck and pain over the left mastoid. As she has diabetes and has not checked her blood sugar since a few months ago and has also been out of her Lantus increased concern for malignant otitis externa. Labs are obtained and reviewed, CBC shows mild anemia with a hemoglobin of 11 however otherwise unremarkable.  CMP shows hyperglycemia at 293 without any significant electrolyte derangements.  Pregnancy test is negative, lactic acid is not elevated.  After consultation with radiologist for determining most appropriate imaging CT neck soft tissue and CT temporal bone is obtained.  These results, including the incidental finding was discussed with the patient along with recommended  incidental finding follow-up, and risks of failing to do so.  Patient states her understanding of this.  I spoke with Dr. Suszanne Conners of ENT given concern for cartilaginous involvement in the setting of uncontrolled diabetes. He recommends starting patient on Cipro 500mg  BID  and clindamycin 300 mg 4 times a day, and strict return precautions for IV antibiotics.  Patient was given her first dose antibiotics in the emergency room, review shows her Tdap was updated in 10/24/2020.  Additionally as patient has been out of her Lantus I will give her a prescription for this.  As she has not been regularly checking her sugars has been off of it for multiple months I will restart her at a lower dose to avoid possibility of hypoglycemia.  Patient states her understanding of this.  Additionally we discussed the importance of PCP follow-up to restart her medications and she states her understanding and that she will.  Return precautions were discussed with patient who states their understanding.  At the time of discharge patient denied any unaddressed complaints or concerns.  Patient is agreeable for discharge home.  Note: Portions of this report may have been transcribed using voice recognition software. Every effort was made to ensure accuracy; however, inadvertent computerized transcription errors may be present    Final Clinical Impression(s) / ED Diagnoses Final diagnoses:  Acute diffuse otitis externa of left ear  Hyperglycemia  Elevated blood pressure reading    Rx / DC Orders ED Discharge Orders          Ordered    Insulin Pen Needle (PEN NEEDLES 29GX1/2") 29G X 12/24/2020 MISC        02/20/21  1839    insulin glargine (LANTUS) 100 unit/mL SOPN  Daily,   Status:  Discontinued        02/20/21 1839    clindamycin (CLEOCIN) 150 MG capsule  4 times daily        02/20/21 1853    ciprofloxacin (CIPRO) 500 MG tablet  Every 12 hours        02/20/21 1853    insulin glargine (LANTUS) 100 unit/mL SOPN  Daily         02/20/21 1931             Norman Clay 02/20/21 2206    Rolan Bucco, MD 02/21/21 1546

## 2021-02-20 NOTE — ED Notes (Signed)
Patient transported to CT 

## 2021-02-20 NOTE — ED Notes (Signed)
Has wound (surface Laceration) to outside of left ear.  States painful with pain shooting into head and down into neck.  Noted palpable lump just below ear

## 2021-02-20 NOTE — ED Notes (Signed)
Pt stated that she has sleep apnea. Pt's O2 at 88%, when sleep. Pt placed on 2L 61f O2, per [employer].

## 2021-02-24 DIAGNOSIS — E662 Morbid (severe) obesity with alveolar hypoventilation: Secondary | ICD-10-CM | POA: Diagnosis not present

## 2021-02-24 DIAGNOSIS — D649 Anemia, unspecified: Secondary | ICD-10-CM | POA: Diagnosis not present

## 2021-02-24 DIAGNOSIS — J9601 Acute respiratory failure with hypoxia: Secondary | ICD-10-CM | POA: Diagnosis not present

## 2021-02-24 DIAGNOSIS — J45901 Unspecified asthma with (acute) exacerbation: Secondary | ICD-10-CM | POA: Diagnosis not present

## 2021-03-15 DIAGNOSIS — Z419 Encounter for procedure for purposes other than remedying health state, unspecified: Secondary | ICD-10-CM | POA: Diagnosis not present

## 2021-03-22 DIAGNOSIS — J45901 Unspecified asthma with (acute) exacerbation: Secondary | ICD-10-CM | POA: Diagnosis not present

## 2021-03-22 DIAGNOSIS — D649 Anemia, unspecified: Secondary | ICD-10-CM | POA: Diagnosis not present

## 2021-03-22 DIAGNOSIS — E662 Morbid (severe) obesity with alveolar hypoventilation: Secondary | ICD-10-CM | POA: Diagnosis not present

## 2021-03-22 DIAGNOSIS — J9601 Acute respiratory failure with hypoxia: Secondary | ICD-10-CM | POA: Diagnosis not present

## 2021-03-24 ENCOUNTER — Ambulatory Visit: Payer: Medicaid Other | Admitting: Pulmonary Disease

## 2021-03-26 DIAGNOSIS — E662 Morbid (severe) obesity with alveolar hypoventilation: Secondary | ICD-10-CM | POA: Diagnosis not present

## 2021-03-26 DIAGNOSIS — D649 Anemia, unspecified: Secondary | ICD-10-CM | POA: Diagnosis not present

## 2021-03-26 DIAGNOSIS — J45901 Unspecified asthma with (acute) exacerbation: Secondary | ICD-10-CM | POA: Diagnosis not present

## 2021-03-26 DIAGNOSIS — J9601 Acute respiratory failure with hypoxia: Secondary | ICD-10-CM | POA: Diagnosis not present

## 2021-04-07 ENCOUNTER — Ambulatory Visit (HOSPITAL_BASED_OUTPATIENT_CLINIC_OR_DEPARTMENT_OTHER): Payer: Medicaid Other | Attending: Primary Care | Admitting: Pulmonary Disease

## 2021-04-07 ENCOUNTER — Other Ambulatory Visit: Payer: Self-pay

## 2021-04-07 DIAGNOSIS — G4733 Obstructive sleep apnea (adult) (pediatric): Secondary | ICD-10-CM | POA: Insufficient documentation

## 2021-04-08 ENCOUNTER — Telehealth: Payer: Self-pay | Admitting: Pulmonary Disease

## 2021-04-08 DIAGNOSIS — G4733 Obstructive sleep apnea (adult) (pediatric): Secondary | ICD-10-CM

## 2021-04-08 NOTE — Telephone Encounter (Signed)
Bipap titration 04/07/21 >> Bipap 29/24 cm H2O >> AHI 6.8.   Please let her know she did best with Bipap during her titration study and she didn't need extra oxygen once she was on optimal Bipap setting.  Please send order to arrange for Bipap 29/24 cm H2O with heated humidity and mask of choice.

## 2021-04-08 NOTE — Procedures (Signed)
     Patient Name: Yvette Gibbs, Yvette Gibbs Date: 04/07/2021 Gender: Female D.O.B: 1990-09-19 Age (years): 30 Referring Provider: Ames Dura NP Height (inches): 62 Interpreting Physician: Coralyn Helling MD, ABSM Weight (lbs): 380 RPSGT: Lowry Ram BMI: 69 MRN: [id number] Neck Size: 19.50  CLINICAL INFORMATION The patient is referred for a BiPAP titration to treat sleep apnea.  Date of HST 01/14/21: AHI 26.7, SpO2 low 70%, spent 248.4 minutes of test time with SpO2 < 89%.  SLEEP STUDY TECHNIQUE As per the AASM Manual for the Scoring of Sleep and Associated Events v2.3 (April 2016) with a hypopnea requiring 4% desaturations.  The channels recorded and monitored were frontal, central and occipital EEG, electrooculogram (EOG), submentalis EMG (chin), nasal and oral airflow, thoracic and abdominal wall motion, anterior tibialis EMG, snore microphone, electrocardiogram, and pulse oximetry. Bilevel positive airway pressure (BPAP) was initiated at the beginning of the study and titrated to treat sleep-disordered breathing.  MEDICATIONS Medications self-administered by patient taken the night of the study : N/A  RESPIRATORY PARAMETERS Optimal IPAP Pressure (cm): 29 AHI at Optimal Pressure (/hr) 6.8 Optimal EPAP Pressure (cm): 24   Overall Minimal O2 (%): 70.0 Minimal O2 at Optimal Pressure (%): 85.0  She was started on CPAP 5 and increased to 15 cm H2O.  She continued to have significant number of obstructive respiratory events.  She was transitioned to Bipap starting at 20/16 and increased to 29/24 cm H2O.  She did best with Bipap 29/24 cm H2O when sleeping in lateral position.  She did not require supplemental oxygen during this study.   SLEEP ARCHITECTURE Start Time: 10:25:21 PM Stop Time: 5:00:32 AM Total Time (min): 395.2 Total Sleep Time (min): 392.5 Sleep Latency (min): 0.1 Sleep Efficiency (%): 99.3% REM Latency (min): 179.5 WASO (min): 2.5 Stage N1 (%): 0.9% Stage N2  (%): 75.2% Stage N3 (%): 0.0% Stage R (%): 24 Supine (%): 69.28 Arousal Index (/hr): 20.8   CARDIAC DATA The 2 lead EKG demonstrated sinus rhythm. The mean heart rate was 100.3 beats per minute. Other EKG findings include: PVCs.  LEG MOVEMENT DATA The total Periodic Limb Movements of Sleep (PLMS) were 0. The PLMS index was 0.0. A PLMS index of <15 is considered normal in adults.  IMPRESSIONS - She was tried on CPAP, but was not able to be adequately titrated. - She did best with Bipap 29/24 cm H2O. - She did not require supplmental oxygen during this study.  DIAGNOSIS - Obstructive Sleep Apnea (G47.33)  RECOMMENDATIONS - Recommend a trial of Bipap 29/24 cm H2O.    - She was fitted with a Resmed Airfit F20 medium full face mask. - Avoid alcohol, sedatives and other CNS depressants that may worsen sleep apnea and disrupt normal sleep architecture. - Sleep hygiene should be reviewed to assess factors that may improve sleep quality. - Weight management and regular exercise should be initiated or continued.  [Electronically signed] 04/08/2021 01:03 PM  Coralyn Helling MD, ABSM Diplomate, American Board of Sleep Medicine   NPI: [id number]  [facility] PH: [phone]   FX: [phone] ACCREDITED BY THE AMERICAN ACADEMY OF SLEEP MEDICINE

## 2021-04-09 NOTE — Telephone Encounter (Signed)
Spoke with patient regarding bipap titration results. Patient voiced understanding. Order placed for bipap. Nothing further needed.

## 2021-04-15 DIAGNOSIS — Z419 Encounter for procedure for purposes other than remedying health state, unspecified: Secondary | ICD-10-CM | POA: Diagnosis not present

## 2021-04-16 ENCOUNTER — Ambulatory Visit: Payer: Medicaid Other | Admitting: Pulmonary Disease

## 2021-04-22 ENCOUNTER — Emergency Department (HOSPITAL_COMMUNITY)
Admission: EM | Admit: 2021-04-22 | Discharge: 2021-04-22 | Disposition: A | Discharge status: Home / Self Care | Payer: Medicaid Other | Attending: Emergency Medicine | Admitting: Emergency Medicine

## 2021-04-22 ENCOUNTER — Encounter (HOSPITAL_COMMUNITY): Payer: Self-pay | Admitting: Emergency Medicine

## 2021-04-22 DIAGNOSIS — E119 Type 2 diabetes mellitus without complications: Secondary | ICD-10-CM | POA: Diagnosis not present

## 2021-04-22 DIAGNOSIS — R059 Cough, unspecified: Secondary | ICD-10-CM | POA: Insufficient documentation

## 2021-04-22 DIAGNOSIS — R000 Tachycardia, unspecified: Secondary | ICD-10-CM | POA: Diagnosis not present

## 2021-04-22 DIAGNOSIS — R519 Headache, unspecified: Secondary | ICD-10-CM | POA: Insufficient documentation

## 2021-04-22 DIAGNOSIS — I10 Essential (primary) hypertension: Secondary | ICD-10-CM | POA: Insufficient documentation

## 2021-04-22 DIAGNOSIS — R1033 Periumbilical pain: Secondary | ICD-10-CM | POA: Insufficient documentation

## 2021-04-22 DIAGNOSIS — R197 Diarrhea, unspecified: Secondary | ICD-10-CM | POA: Insufficient documentation

## 2021-04-22 DIAGNOSIS — Z7984 Long term (current) use of oral hypoglycemic drugs: Secondary | ICD-10-CM | POA: Insufficient documentation

## 2021-04-22 DIAGNOSIS — Z794 Long term (current) use of insulin: Secondary | ICD-10-CM | POA: Diagnosis not present

## 2021-04-22 DIAGNOSIS — M791 Myalgia, unspecified site: Secondary | ICD-10-CM | POA: Insufficient documentation

## 2021-04-22 DIAGNOSIS — M542 Cervicalgia: Secondary | ICD-10-CM | POA: Diagnosis not present

## 2021-04-22 DIAGNOSIS — D649 Anemia, unspecified: Secondary | ICD-10-CM | POA: Diagnosis not present

## 2021-04-22 DIAGNOSIS — J9601 Acute respiratory failure with hypoxia: Secondary | ICD-10-CM | POA: Diagnosis not present

## 2021-04-22 DIAGNOSIS — J45901 Unspecified asthma with (acute) exacerbation: Secondary | ICD-10-CM | POA: Insufficient documentation

## 2021-04-22 DIAGNOSIS — I499 Cardiac arrhythmia, unspecified: Secondary | ICD-10-CM | POA: Diagnosis not present

## 2021-04-22 DIAGNOSIS — R112 Nausea with vomiting, unspecified: Secondary | ICD-10-CM | POA: Insufficient documentation

## 2021-04-22 DIAGNOSIS — R6889 Other general symptoms and signs: Secondary | ICD-10-CM | POA: Diagnosis not present

## 2021-04-22 DIAGNOSIS — R509 Fever, unspecified: Secondary | ICD-10-CM | POA: Diagnosis not present

## 2021-04-22 DIAGNOSIS — Z743 Need for continuous supervision: Secondary | ICD-10-CM | POA: Diagnosis not present

## 2021-04-22 DIAGNOSIS — Z20822 Contact with and (suspected) exposure to covid-19: Secondary | ICD-10-CM | POA: Insufficient documentation

## 2021-04-22 DIAGNOSIS — Z79899 Other long term (current) drug therapy: Secondary | ICD-10-CM | POA: Diagnosis not present

## 2021-04-22 DIAGNOSIS — R631 Polydipsia: Secondary | ICD-10-CM | POA: Insufficient documentation

## 2021-04-22 DIAGNOSIS — M545 Low back pain, unspecified: Secondary | ICD-10-CM | POA: Insufficient documentation

## 2021-04-22 DIAGNOSIS — E662 Morbid (severe) obesity with alveolar hypoventilation: Secondary | ICD-10-CM | POA: Diagnosis not present

## 2021-04-22 DIAGNOSIS — R739 Hyperglycemia, unspecified: Secondary | ICD-10-CM | POA: Diagnosis not present

## 2021-04-22 LAB — COMPREHENSIVE METABOLIC PANEL
ALT: 44 U/L (ref 0–44)
AST: 49 U/L — ABNORMAL HIGH (ref 15–41)
Albumin: 3.4 g/dL — ABNORMAL LOW (ref 3.5–5.0)
Alkaline Phosphatase: 73 U/L (ref 38–126)
Anion gap: 14 (ref 5–15)
BUN: 10 mg/dL (ref 6–20)
CO2: 28 mmol/L (ref 22–32)
Calcium: 8.6 mg/dL — ABNORMAL LOW (ref 8.9–10.3)
Chloride: 91 mmol/L — ABNORMAL LOW (ref 98–111)
Creatinine, Ser: 0.91 mg/dL (ref 0.44–1.00)
GFR, Estimated: >60 mL/min (ref >60)
Glucose, Bld: 382 mg/dL — ABNORMAL HIGH (ref 70–99)
Potassium: 3.5 mmol/L (ref 3.5–5.1)
Sodium: 133 mmol/L — ABNORMAL LOW (ref 135–145)
Total Bilirubin: 0.7 mg/dL (ref 0.3–1.2)
Total Protein: 8.2 g/dL — ABNORMAL HIGH (ref 6.5–8.1)

## 2021-04-22 LAB — URINALYSIS, ROUTINE W REFLEX MICROSCOPIC
Bacteria, UA: NONE SEEN
Bilirubin Urine: NEGATIVE
Glucose, UA: NEGATIVE mg/dL
Ketones, ur: NEGATIVE mg/dL
Leukocytes,Ua: NEGATIVE
Nitrite: NEGATIVE
Protein, ur: NEGATIVE mg/dL
Specific Gravity, Urine: 1.008 (ref 1.005–1.030)
pH: 5 (ref 5.0–8.0)

## 2021-04-22 LAB — RESP PANEL BY RT-PCR (FLU A&B, COVID) ARPGX2
Influenza A by PCR: NEGATIVE
Influenza B by PCR: NEGATIVE
SARS Coronavirus 2 by RT PCR: NEGATIVE

## 2021-04-22 LAB — CBC WITH DIFFERENTIAL/PLATELET
Abs Immature Granulocytes: 0.06 10*3/uL (ref 0.00–0.07)
Basophils Absolute: 0 10*3/uL (ref 0.0–0.1)
Basophils Relative: 0 %
Eosinophils Absolute: 0.1 10*3/uL (ref 0.0–0.5)
Eosinophils Relative: 1 %
HCT: 40.3 % (ref 36.0–46.0)
Hemoglobin: 12.5 g/dL (ref 12.0–15.0)
Immature Granulocytes: 1 %
Lymphocytes Relative: 22 %
Lymphs Abs: 1.7 10*3/uL (ref 0.7–4.0)
MCH: 27.2 pg (ref 26.0–34.0)
MCHC: 31 g/dL (ref 30.0–36.0)
MCV: 87.8 fL (ref 80.0–100.0)
Monocytes Absolute: 0.8 10*3/uL (ref 0.1–1.0)
Monocytes Relative: 10 %
Neutro Abs: 5.2 10*3/uL (ref 1.7–7.7)
Neutrophils Relative %: 66 %
Platelets: 260 10*3/uL (ref 150–400)
RBC: 4.59 MIL/uL (ref 3.87–5.11)
RDW: 14.2 % (ref 11.5–15.5)
WBC: 7.8 10*3/uL (ref 4.0–10.5)
nRBC: 0 % (ref 0.0–0.2)

## 2021-04-22 LAB — LIPASE, BLOOD: Lipase: 33 U/L (ref 11–51)

## 2021-04-22 LAB — I-STAT BETA HCG BLOOD, ED (MC, WL, AP ONLY): I-stat hCG, quantitative: <5 m[IU]/mL (ref <5)

## 2021-04-22 LAB — TROPONIN I (HIGH SENSITIVITY): Troponin I (High Sensitivity): 6 ng/L (ref <18)

## 2021-04-22 MED ORDER — DIPHENHYDRAMINE HCL 50 MG/ML IJ SOLN
25.0000 mg | Freq: Once | INTRAMUSCULAR | Status: AC
  Administered 2021-04-22: 25 mg via INTRAVENOUS
  Filled 2021-04-22: qty 1

## 2021-04-22 MED ORDER — METOCLOPRAMIDE HCL 5 MG/ML IJ SOLN
10.0000 mg | Freq: Once | INTRAMUSCULAR | Status: AC
  Administered 2021-04-22: 10 mg via INTRAVENOUS
  Filled 2021-04-22: qty 2

## 2021-04-22 MED ORDER — KETOROLAC TROMETHAMINE 15 MG/ML IJ SOLN
15.0000 mg | Freq: Once | INTRAMUSCULAR | Status: AC
  Administered 2021-04-22: 15 mg via INTRAVENOUS
  Filled 2021-04-22: qty 1

## 2021-04-22 MED ORDER — ONDANSETRON 4 MG PO TBDP: 4.0000 mg | ORAL_TABLET | Freq: Three times a day (TID) | ORAL | 0 refills | Status: DC | PRN

## 2021-04-22 MED ORDER — SODIUM CHLORIDE 0.9 % IV BOLUS
1000.0000 mL | Freq: Once | INTRAVENOUS | Status: AC
  Administered 2021-04-22: 1000 mL via INTRAVENOUS

## 2021-04-22 NOTE — ED Notes (Signed)
Pt provided [place] sandwich & ginger ale for PO challenge

## 2021-04-22 NOTE — Discharge Instructions (Addendum)
Please ensure to maintain fluid intake.  Follow-up with your primary care provider scheduled. Return to the emergency department if you are experiencing increasing or worsening abdominal pain, nausea, vomiting, or unable to keep fluids down.

## 2021-04-22 NOTE — ED Provider Notes (Signed)
EMERGENCY DEPARTMENT  US GUIDANCE EXAM Emergency Ultrasound:  US Guidance for Needle Guidance  INDICATIONS: Difficult vascular access Linear probe used in real-time to visualize location of needle entry through skin.   PERFORMED BY: Myself IMAGES ARCHIVED?: No LIMITATIONS:  habitus VIEWS USED: Transverse INTERPRETATION:  unsuccessful     Renne Crigler, PA-C 04/22/21 1729    Melene Plan, DO 04/22/21 2244

## 2021-04-22 NOTE — ED Provider Notes (Signed)
Emergency Medicine Provider Triage Evaluation Note  Yvette Gibbs , a 30 y.o. female  was evaluated in triage.  Pt complains of headache, chills, nausea and vomiting.  She states that Sunday she began feeling body aches in the evening.  She had 1 episode of nausea with vomiting.  She states that yesterday she began feeling worse having headache, cough and fever.  She states that today she has began having shortness of breath and chest pain.  She states that she feels like a "elephant is sitting on her chest."  She states that the pain is constant and intermittently radiates to her left breast.  She also reports feeling very thirsty and increased urination.  Review of Systems  Positive: Probably diphtheria, headache, cough Negative: Syncope  Physical Exam  BP (!) 152/94 (BP Location: Right Arm)   Pulse (!) 118   Temp 98.2 F (36.8 C) (Oral)   Resp 20   Ht 5\' 2"  (1.575 m)   Wt (!) 172.4 kg   LMP 03/31/2021   SpO2 98%   BMI 69.50 kg/m  Gen:   Awake, no distress   Resp:  Normal effort.  Coarse breath sounds on the right, no wheezing bilaterally. MSK:   Moves extremities without difficulty  Other:  S1/S2, tachycardia without obvious murmur.  Abdomen is protuberant, soft, nontender to palpation.  Bowel sounds present.  Oropharynx with mild erythema without any tonsillar exudates.  Medical Decision Making  Medically screening exam initiated at 3:27 PM.  Appropriate orders placed.  Yvette Gibbs was informed that the remainder of the evaluation will be completed by another provider, this initial triage assessment does not replace that evaluation, and the importance of remaining in the ED until their evaluation is complete.     04/02/2021, PA-C 04/22/21 1529    13/08/22, MD 04/23/21 1434

## 2021-04-22 NOTE — ED Triage Notes (Signed)
Patient reports symptoms starting Sunday evening- having fever, generalized body aches, nausea, vomiting, and diarrhea.

## 2021-04-22 NOTE — ED Notes (Signed)
Help get patient pulled up in the bed and cleaned up sheets changed and gown placed a external cath patient is resting with call bell in reach

## 2021-04-22 NOTE — ED Provider Notes (Signed)
[facility] EMERGENCY DEPARTMENT Provider Note   CSN: [id number] Arrival date & time: 04/22/21  1500     History Chief Complaint  Patient presents with   Emesis    Yvette Gibbs is a 30 y.o. female with a past medical history of diabetes presents to the emergency department complaining of emesis onset 2 days ago.  Patient had 2 episodes of emesis yesterday consistent of stomach contents.  Patient just started a new job, patient denies any sick contacts at work at home.  She has associated intermittent, 10/10 headache, chills, nausea, myalgias, subjective fever, sneezing, generalized abdominal pain, lower back pain, cough, lateral neck pain, diarrhea, and increased thirst. Patient reports increased thirst, she has not taken her Trulicity since July.  She is still taking insulin and was taken off her metformin.  Her next follow-up appointment the primary care provider is in March.  Her headache is worse with lights and loud noises.  Patient has tried over-the-counter Tylenol with minimal relief of her symptoms.  She denies dysuria, hematuria, vaginal bleeding, vaginal discharge, hematemesis, hematochezia, or neck stiffness.  She denies allergies to medications.  Denies sick contacts.  Denies pregnancy at this time.  Patient's last menstrual period was 03/31/2021.     The history is provided by the patient. No language interpreter was used.      Past Medical History:  Diagnosis Date   Asthma    Depression    Diabetes mellitus type 2, uncontrolled    Extreme obesity    Family history of adverse reaction to anesthesia    " my grandmother had an allergic reaction   Hypertension    Mental disorder    Obesity    OSA (obstructive sleep apnea)     Patient Active Problem List   Diagnosis Date Noted   Acute asthma exacerbation 12/17/2020   Obstructive lung disease ([place]) 12/17/2020   Acute on chronic respiratory failure with hypoxia and hypercapnia ([place]) 12/17/2020    Heart failure with preserved ejection fraction ([place]) 10/01/2020   Obesity hypoventilation syndrome ([place]) 09/29/2020   Acute respiratory failure with hypoxia ([place]) 09/29/2020   Normocytic anemia 09/29/2020   Mild concentric left ventricular hypertrophy (LVH) 09/29/2020   NAFLD (nonalcoholic fatty liver disease) 09/29/2020   Asthma exacerbation 09/28/2020   Acute respiratory failure due to COVID-19 ([place]) 05/21/2020   Gastroesophageal reflux disease without esophagitis 06/19/2019   Loud snoring 06/19/2019   Type 2 diabetes mellitus without complication, without long-term current use of insulin ([place]) 03/04/2019   Abdominal pain 03/03/2019   HTN (hypertension) 03/03/2019   Hyperglycemia 03/03/2019   Proteinuria 03/03/2019   HCAP (healthcare-associated pneumonia) 09/07/2015   Severe sepsis ([place]) 09/07/2015   Asthma 09/07/2015   Pleuritic chest pain 09/01/2015   Morbid obesity ([place]) 09/01/2015   OSA (obstructive sleep apnea) 09/01/2015   Influenza A 0000000   Biliary colic [id number]    Past Surgical History:  Procedure Laterality Date   APPENDECTOMY     CHOLECYSTECTOMY N/A 09/05/2014   Procedure: LAPAROSCOPIC CHOLECYSTECTOMY;  Surgeon: Ralene Ok, MD;  Location: [facility] OR;  Service: General;  Laterality: N/A;   TONSILLECTOMY       OB History   No obstetric history on file.     Family History  Problem Relation Age of Onset   Diabetes Mellitus II Maternal Grandmother     Social History   Tobacco Use   Smoking status: Never   Smokeless tobacco: Never  Vaping Use   Vaping Use: Never used  Substance Use Topics   Alcohol use: Not Currently   Drug use: Not Currently    Types: Marijuana    Home Medications Prior to Admission medications   Medication Sig Start Date End Date Taking? Authorizing Provider  ondansetron (ZOFRAN ODT) 4 MG disintegrating tablet Take 1 tablet (4 mg total) by mouth every 8 (eight) hours as needed for nausea or vomiting. 04/22/21  Yes Janah Mcculloh,  Hazell Siwik A, PA-C  Accu-Chek Softclix Lancets lancets Use as instructed 10/08/20   Alphonzo Severance, MD  albuterol (VENTOLIN HFA) 108 (90 Base) MCG/ACT inhaler Inhale 1-2 puffs into the lungs every 6 (six) hours as needed for wheezing or shortness of breath. 03/21/19   Marcine Matar, MD  Dulaglutide (TRULICITY) 0.75 MG/0.5ML SOPN Inject 0.75 mg into the skin once a week. 10/24/20   Grayce Sessions, NP  famotidine (PEPCID) 20 MG tablet TAKE 1 TABLET BY MOUTH TWICE A DAY 01/20/21   Coralyn Helling, MD  insulin glargine (LANTUS) 100 unit/mL SOPN Inject 45 Units into the skin daily. 02/20/21   Cristina Gong, PA-C  Insulin Pen Needle (PEN NEEDLES 29GX1/2") 29G X MISC Use as directed 02/20/21   Cristina Gong, PA-C  metFORMIN (GLUCOPHAGE) 1000 MG tablet Take 1 tablet (1,000 mg total) by mouth 2 (two) times daily with a meal. 10/24/20   Grayce Sessions, NP  pantoprazole (PROTONIX) 40 MG tablet TAKE 1 TABLET BY MOUTH EVERY DAY 01/20/21   Coralyn Helling, MD  torsemide 40 MG TABS Take 40 mg by mouth 2 (two) times daily. 10/08/20   Alphonzo Severance, MD    Allergies    Bee pollen, Fish allergy, and Lisinopril  Review of Systems   Review of Systems  Constitutional:  Positive for chills and fever.  HENT:  Positive for sneezing. Negative for congestion, ear pain, rhinorrhea and sore throat.   Respiratory:  Positive for cough. Negative for shortness of breath.   Cardiovascular:  Negative for chest pain.  Gastrointestinal:  Positive for abdominal pain, diarrhea, nausea and vomiting.  Genitourinary:  Negative for dysuria, hematuria, vaginal bleeding and vaginal discharge.  Musculoskeletal:  Positive for back pain, myalgias and neck pain.  Skin:  Negative for rash.  Neurological:  Positive for headaches.   Physical Exam Updated Vital Signs BP (!) 152/94 (BP Location: Right Arm)   Pulse (!) 118   Temp 98.2 F (36.8 C) (Oral)   Resp 20   Ht 5\' 2"  (1.575 m)   Wt (!) 172.4 kg   LMP 03/31/2021    SpO2 98%   BMI 69.50 kg/m   Physical Exam Vitals and nursing note reviewed.  Constitutional:      General: She is not in acute distress.    Appearance: She is not diaphoretic.  HENT:     Head: Normocephalic and atraumatic.     Right Ear: External ear normal.     Left Ear: External ear normal.     Nose: Nose normal.     Mouth/Throat:     Mouth: Mucous membranes are moist.     Pharynx: Oropharynx is clear. No oropharyngeal exudate or posterior oropharyngeal erythema.  Eyes:     General: No scleral icterus.    Extraocular Movements: Extraocular movements intact.     Conjunctiva/sclera: Conjunctivae normal.     Pupils: Pupils are equal, round, and reactive to light.  Cardiovascular:     Rate and Rhythm: Regular rhythm. Tachycardia present.     Pulses: Normal pulses.     Heart  sounds: Normal heart sounds.  Pulmonary:     Effort: Pulmonary effort is normal. No respiratory distress.     Breath sounds: Normal breath sounds. No wheezing.  Abdominal:     General: Bowel sounds are normal.     Palpations: Abdomen is soft. There is no mass.     Tenderness: There is generalized abdominal tenderness. There is no right CVA tenderness, left CVA tenderness, guarding or rebound.     Comments: Diffuse abdominal tenderness to palpation.  No overlying skin changes.  Musculoskeletal:        General: No tenderness or deformity. Normal range of motion.     Cervical back: Normal range of motion and neck supple.     Comments: Strength and sensation intact to bilateral upper and lower extremities. No spinous TTP to C, T, L, S. Negative SLR bilaterally  Skin:    General: Skin is warm and dry.  Neurological:     Mental Status: She is alert.     Coordination: Coordination normal.  Psychiatric:        Behavior: Behavior normal.    ED Results / Procedures / Treatments   Labs (all labs ordered are listed, but only abnormal results are displayed) Labs Reviewed  COMPREHENSIVE METABOLIC PANEL -  Abnormal; Notable for the following components:      Result Value   Sodium 133 (*)    Chloride 91 (*)    Glucose, Bld 382 (*)    Calcium 8.6 (*)    Total Protein 8.2 (*)    Albumin 3.4 (*)    AST 49 (*)    All other components within normal limits  URINALYSIS, ROUTINE W REFLEX MICROSCOPIC - Abnormal; Notable for the following components:   Color, Urine STRAW (*)    Hgb urine dipstick MODERATE (*)    All other components within normal limits  RESP PANEL BY RT-PCR (FLU A&B, COVID) ARPGX2  LIPASE, BLOOD  CBC WITH DIFFERENTIAL/PLATELET  I-STAT BETA HCG BLOOD, ED ([facility], [facility], [facility] ONLY)  CBG MONITORING, ED  TROPONIN I (HIGH SENSITIVITY)  TROPONIN I (HIGH SENSITIVITY)    EKG EKG Interpretation  Date/Time:  Tuesday April 22 2021 15:31:29 EST Ventricular Rate:  118 PR Interval:  150 QRS Duration: 112 QT Interval:  344 QTC Calculation: 482 R Axis:   -63 Text Interpretation: Sinus tachycardia Incomplete right bundle branch block Left anterior fascicular block No significant change since last tracing Confirmed by Deno Etienne [phone]) on 04/22/2021 7:00:12 PM  Radiology No results found.  Procedures Procedures   Medications Ordered in ED Medications  metoCLOPramide (REGLAN) injection 10 mg (10 mg Intravenous Given 04/22/21 1802)  diphenhydrAMINE (BENADRYL) injection 25 mg (25 mg Intravenous Given 04/22/21 1802)  sodium chloride 0.9 % bolus 1,000 mL (0 mLs Intravenous Stopped 04/22/21 2024)  ketorolac (TORADOL) 15 MG/ML injection 15 mg (15 mg Intravenous Given 04/22/21 2024)    ED Course  I have reviewed the triage vital signs and the nursing notes.  Pertinent labs & imaging results that were available during my care of the patient were reviewed by me and considered in my medical decision making (see chart for details).  5:46 PM - [place] IV placed by attending  6:25 PM -patient reevaluated and patient resting comfortably in stretcher.  Patient pain improved at this time.   7:42 PM   -patient resting comfortably in stretcher.  PO fluid and food challenge tolerated without difficulty. Patient pain minimally improved.  Toradol ordered.    9:15 PM - patient  resting comfortably on stretcher.  Patient reports pain resolved at this time.  Patient appears safe for discharge.      MDM Rules/Calculators/A&P                          Presents with emesis onset 2 days.  No sick contacts at home or work.  Differential diagnosis includes gastroparesis, viral gastroenteritis, COVID, flu, or UTI. Patient afebrile, slightly tachycardic, with oxygen saturation at 98%. On exam, patient with diffuse abdominal tenderness to palpation otherwise cardiovascular, and pulmonary without acute findings. EKG today notable for sinus tachycardia without acute ST/T changes.  Reviewed prior EKGs from past year, all showing sinus tachycardia similar to today. Troponin negative.  CBC and lipase unremarkable.  Glucose elevated at 382, sodium corrected at 138.  Low likelihood of DKA at this time, fluid bolus given.  Urinalysis without concern for infection.  Negative Hcg. COVID and flu swabs negative. Given Reglan, Benadryl, and and Toradol in the ED.  Less likely sepsis at this time, patient afebrile and labs unremarkable.  Low likelihood for PE at this time, patient without chest pain or shortness of breath.  Patient with tachycardia during prior ED visits similar to today.  Repeat vital signs at discharge with heart rate decreased to 90 (see vital signs below).  BP (!) 157/94   Pulse 90   Temp 98.2 F (36.8 C) (Oral)   Resp (!) 27   Ht 5\' 2"  (1.575 m)   Wt (!) 172.4 kg   LMP 03/31/2021   SpO2 96%   BMI 69.50 kg/m   Suspect abdominal pain due to viral etiology.  Zofran prescription given. Strict return precautions discussed with patient including increasing or worsening abdominal pain, decreased p.o. intake, chest pain.  Appears safe for discharge at this time.  Follow-up as indicated in discharge  paperwork.   Final Clinical Impression(s) / ED Diagnoses Final diagnoses:  Periumbilical abdominal pain  Nausea and vomiting, unspecified vomiting type    Rx / DC Orders ED Discharge Orders          Ordered    ondansetron (ZOFRAN ODT) 4 MG disintegrating tablet  Every 8 hours PRN        04/22/21 2117             Aubrie Lucien A, PA-C 04/22/21 [place], Dan, DO 04/23/21 1458

## 2021-04-26 DIAGNOSIS — J9601 Acute respiratory failure with hypoxia: Secondary | ICD-10-CM | POA: Diagnosis not present

## 2021-04-26 DIAGNOSIS — D649 Anemia, unspecified: Secondary | ICD-10-CM | POA: Diagnosis not present

## 2021-04-26 DIAGNOSIS — J45901 Unspecified asthma with (acute) exacerbation: Secondary | ICD-10-CM | POA: Diagnosis not present

## 2021-04-26 DIAGNOSIS — E662 Morbid (severe) obesity with alveolar hypoventilation: Secondary | ICD-10-CM | POA: Diagnosis not present

## 2021-04-30 ENCOUNTER — Emergency Department (HOSPITAL_COMMUNITY): Payer: Medicaid Other

## 2021-04-30 ENCOUNTER — Encounter (HOSPITAL_COMMUNITY): Payer: Self-pay | Admitting: *Deleted

## 2021-04-30 ENCOUNTER — Emergency Department (HOSPITAL_COMMUNITY)
Admission: EM | Admit: 2021-04-30 | Discharge: 2021-04-30 | Disposition: A | Discharge status: Home / Self Care | Payer: Medicaid Other | Attending: Emergency Medicine | Admitting: Emergency Medicine

## 2021-04-30 ENCOUNTER — Other Ambulatory Visit: Payer: Self-pay

## 2021-04-30 DIAGNOSIS — J101 Influenza due to other identified influenza virus with other respiratory manifestations: Secondary | ICD-10-CM | POA: Diagnosis not present

## 2021-04-30 DIAGNOSIS — Z743 Need for continuous supervision: Secondary | ICD-10-CM | POA: Diagnosis not present

## 2021-04-30 DIAGNOSIS — Z20822 Contact with and (suspected) exposure to covid-19: Secondary | ICD-10-CM | POA: Insufficient documentation

## 2021-04-30 DIAGNOSIS — E119 Type 2 diabetes mellitus without complications: Secondary | ICD-10-CM | POA: Insufficient documentation

## 2021-04-30 DIAGNOSIS — Z7984 Long term (current) use of oral hypoglycemic drugs: Secondary | ICD-10-CM | POA: Insufficient documentation

## 2021-04-30 DIAGNOSIS — Z79899 Other long term (current) drug therapy: Secondary | ICD-10-CM | POA: Insufficient documentation

## 2021-04-30 DIAGNOSIS — J811 Chronic pulmonary edema: Secondary | ICD-10-CM | POA: Diagnosis not present

## 2021-04-30 DIAGNOSIS — J45909 Unspecified asthma, uncomplicated: Secondary | ICD-10-CM | POA: Diagnosis not present

## 2021-04-30 DIAGNOSIS — R110 Nausea: Secondary | ICD-10-CM | POA: Diagnosis not present

## 2021-04-30 DIAGNOSIS — R519 Headache, unspecified: Secondary | ICD-10-CM | POA: Diagnosis not present

## 2021-04-30 DIAGNOSIS — R059 Cough, unspecified: Secondary | ICD-10-CM | POA: Diagnosis present

## 2021-04-30 DIAGNOSIS — R0602 Shortness of breath: Secondary | ICD-10-CM | POA: Diagnosis not present

## 2021-04-30 DIAGNOSIS — R6889 Other general symptoms and signs: Secondary | ICD-10-CM | POA: Diagnosis not present

## 2021-04-30 DIAGNOSIS — R000 Tachycardia, unspecified: Secondary | ICD-10-CM | POA: Diagnosis not present

## 2021-04-30 DIAGNOSIS — I499 Cardiac arrhythmia, unspecified: Secondary | ICD-10-CM | POA: Diagnosis not present

## 2021-04-30 DIAGNOSIS — Z794 Long term (current) use of insulin: Secondary | ICD-10-CM | POA: Insufficient documentation

## 2021-04-30 DIAGNOSIS — R1111 Vomiting without nausea: Secondary | ICD-10-CM | POA: Diagnosis not present

## 2021-04-30 LAB — RESP PANEL BY RT-PCR (FLU A&B, COVID) ARPGX2
Influenza A by PCR: POSITIVE — AB
Influenza B by PCR: NEGATIVE
SARS Coronavirus 2 by RT PCR: NEGATIVE

## 2021-04-30 MED ORDER — ACETAMINOPHEN 500 MG PO TABS: 1000.0000 mg | ORAL_TABLET | Freq: Four times a day (QID) | ORAL | 0 refills | Status: DC | PRN

## 2021-04-30 MED ORDER — BENZONATATE 100 MG PO CAPS: 100.0000 mg | ORAL_CAPSULE | Freq: Three times a day (TID) | ORAL | 0 refills | Status: DC

## 2021-04-30 MED ORDER — ACETAMINOPHEN 500 MG PO TABS
1000.0000 mg | ORAL_TABLET | Freq: Once | ORAL | Status: AC
  Administered 2021-04-30: 1000 mg via ORAL
  Filled 2021-04-30: qty 2

## 2021-04-30 NOTE — ED Provider Notes (Signed)
Emergency Medicine Provider Triage Evaluation Note  Yvette Gibbs , a 30 y.o. female  was evaluated in triage.  Pt complains of URI symptoms that began on Sunday.  Reports sick church members.  Has been utilizing Tylenol over-the-counter that is not resolving her discomfort.  Review of Systems  Positive: Body chills, sore throat, body aches, productive cough, shortness of breath Negative:   Physical Exam  LMP 03/31/2021  Gen:   Awake, no distress   Resp:  Normal effort  MSK:   Moves extremities without difficulty  Other:  RRR, CTA B  Medical Decision Making  Medically screening exam initiated at 1:34 PM.  Appropriate orders placed.  Yvette Gibbs was informed that the remainder of the evaluation will be completed by another provider, this initial triage assessment does not replace that evaluation, and the importance of remaining in the ED until their evaluation is complete.     Saddie Benders, PA-C 04/30/21 1335    Rozelle Logan, DO 04/30/21 1405

## 2021-04-30 NOTE — Discharge Instructions (Addendum)
You have been diagnosed with influenza A.  Please take Tylenol as needed for headache and fever.  Take Tessalon as needed for cough.  Get plenty of rest, drink plenty of fluid and you may follow-up with your doctor for further care.

## 2021-04-30 NOTE — ED Provider Notes (Signed)
[facility] EMERGENCY DEPARTMENT Provider Note   CSN: [id number] Arrival date & time: 04/30/21  1321     History Chief Complaint  Patient presents with   Emesis   Cough    Yvette Gibbs is a 30 y.o. female.  The history is provided by the patient and medical records. No language interpreter was used.  Emesis Associated symptoms: cough   Cough  30 year old female significant history of obesity, diabetes, hypertension, mental disorder, obstructive lung disease, CHF brought here via EMS from home for flulike symptoms.  Patient report for the past 3 to 4 days she felt as if she was hit by a truck.  She endorsed having headache, congestion, persistent cough, body aches, chills, fever, and overall felt similar to when she had the flu in the past.  Endorsed recent sick contact.  She initially felt dizzy and nauseous earlier but now endorse having an appetite.  Symptoms moderate in severity.  She denies any significant shortness of breath, vomiting or diarrhea.  No wheezing.  Past Medical History:  Diagnosis Date   Asthma    Depression    Diabetes mellitus type 2, uncontrolled    Extreme obesity    Family history of adverse reaction to anesthesia    " my grandmother had an allergic reaction   Hypertension    Mental disorder    Obesity    OSA (obstructive sleep apnea)     Patient Active Problem List   Diagnosis Date Noted   Acute asthma exacerbation 12/17/2020   Obstructive lung disease ([place]) 12/17/2020   Acute on chronic respiratory failure with hypoxia and hypercapnia ([place]) 12/17/2020   Heart failure with preserved ejection fraction ([place]) 10/01/2020   Obesity hypoventilation syndrome ([place]) 09/29/2020   Acute respiratory failure with hypoxia ([place]) 09/29/2020   Normocytic anemia 09/29/2020   Mild concentric left ventricular hypertrophy (LVH) 09/29/2020   NAFLD (nonalcoholic fatty liver disease) 09/29/2020   Asthma exacerbation 09/28/2020   Acute  respiratory failure due to COVID-19 ([place]) 05/21/2020   Gastroesophageal reflux disease without esophagitis 06/19/2019   Loud snoring 06/19/2019   Type 2 diabetes mellitus without complication, without long-term current use of insulin ([place]) 03/04/2019   Abdominal pain 03/03/2019   HTN (hypertension) 03/03/2019   Hyperglycemia 03/03/2019   Proteinuria 03/03/2019   HCAP (healthcare-associated pneumonia) 09/07/2015   Severe sepsis ([place]) 09/07/2015   Asthma 09/07/2015   Pleuritic chest pain 09/01/2015   Morbid obesity ([place]) 09/01/2015   OSA (obstructive sleep apnea) 09/01/2015   Influenza A 0000000   Biliary colic [id number]    Past Surgical History:  Procedure Laterality Date   APPENDECTOMY     CHOLECYSTECTOMY N/A 09/05/2014   Procedure: LAPAROSCOPIC CHOLECYSTECTOMY;  Surgeon: Ralene Ok, MD;  Location: [facility];  Service: General;  Laterality: N/A;   TONSILLECTOMY       OB History   No obstetric history on file.     Family History  Problem Relation Age of Onset   Diabetes Mellitus II Maternal Grandmother     Social History   Tobacco Use   Smoking status: Never   Smokeless tobacco: Never  Vaping Use   Vaping Use: Never used  Substance Use Topics   Alcohol use: Not Currently   Drug use: Not Currently    Types: Marijuana    Home Medications Prior to Admission medications   Medication Sig Start Date End Date Taking? Authorizing Provider  Accu-Chek Softclix Lancets lancets Use as instructed 10/08/20   Alexandria Lodge, MD  albuterol (VENTOLIN HFA) 108 (90 Base) MCG/ACT inhaler Inhale 1-2 puffs into the lungs every 6 (six) hours as needed for wheezing or shortness of breath. 03/21/19   Ladell Pier, MD  Dulaglutide (TRULICITY) A999333 0000000 SOPN Inject 0.75 mg into the skin once a week. 10/24/20   Kerin Perna, NP  famotidine (PEPCID) 20 MG tablet TAKE 1 TABLET BY MOUTH TWICE A DAY 01/20/21   Chesley Mires, MD  insulin glargine (LANTUS) 100 unit/mL SOPN Inject  45 Units into the skin daily. 02/20/21   Lorin Glass, PA-C  Insulin Pen Needle (PEN NEEDLES 29GX1/2") 29G X 12MM MISC Use as directed 02/20/21   Lorin Glass, PA-C  metFORMIN (GLUCOPHAGE) 1000 MG tablet Take 1 tablet (1,000 mg total) by mouth 2 (two) times daily with a meal. 10/24/20   Kerin Perna, NP  ondansetron (ZOFRAN ODT) 4 MG disintegrating tablet Take 1 tablet (4 mg total) by mouth every 8 (eight) hours as needed for nausea or vomiting. 04/22/21   Blue, Soijett A, PA-C  pantoprazole (PROTONIX) 40 MG tablet TAKE 1 TABLET BY MOUTH EVERY DAY 01/20/21   Chesley Mires, MD  torsemide 40 MG TABS Take 40 mg by mouth 2 (two) times daily. 10/08/20   Alexandria Lodge, MD    Allergies    Bee pollen, Fish allergy, and Lisinopril  Review of Systems   Review of Systems  Respiratory:  Positive for cough.   Gastrointestinal:  Positive for vomiting.  All other systems reviewed and are negative.  Physical Exam Updated Vital Signs BP (!) 159/112 (BP Location: Right Wrist)   Pulse (!) 110   Temp (!) 100.5 F (38.1 C)   Resp (!) 22   LMP 04/16/2021   SpO2 97%   Physical Exam Vitals and nursing note reviewed.  Constitutional:      General: She is not in acute distress.    Appearance: She is well-developed. She is obese.  HENT:     Head: Atraumatic.  Eyes:     Conjunctiva/sclera: Conjunctivae normal.  Cardiovascular:     Rate and Rhythm: Tachycardia present.     Pulses: Normal pulses.     Heart sounds: Normal heart sounds.  Pulmonary:     Effort: Pulmonary effort is normal.     Comments: Decreased breath sounds without overt wheezes, rales, or rhonchi heard.  Exam limited due to large body habitus. Abdominal:     Palpations: Abdomen is soft.  Musculoskeletal:     Cervical back: Neck supple.  Skin:    Findings: No rash.  Neurological:     Mental Status: She is alert. Mental status is at baseline.  Psychiatric:        Mood and Affect: Mood normal.    ED Results /  Procedures / Treatments   Labs (all labs ordered are listed, but only abnormal results are displayed) Labs Reviewed  RESP PANEL BY RT-PCR (FLU A&B, COVID) ARPGX2 - Abnormal; Notable for the following components:      Result Value   Influenza A by PCR POSITIVE (*)    All other components within normal limits    EKG None  Radiology DG Chest 1 View  Result Date: 04/30/2021 CLINICAL DATA:  Short of breath.  Flu symptoms.  Headache. EXAM: CHEST  1 VIEW COMPARISON:  12/16/2020 FINDINGS: Cardiac enlargement with pulmonary vascular congestion. Mild bilateral perihilar edema. No significant pleural effusion. IMPRESSION: Cardiac enlargement with pulmonary vascular congestion and mild edema. Findings compatible with fluid overload. Electronically Signed  By: Marlan Palau M.D.   On: 04/30/2021 14:25    Procedures Procedures   Medications Ordered in ED Medications  acetaminophen (TYLENOL) tablet 1,000 mg (1,000 mg Oral Given 04/30/21 1707)    ED Course  I have reviewed the triage vital signs and the nursing notes.  Pertinent labs & imaging results that were available during my care of the patient were reviewed by me and considered in my medical decision making (see chart for details).    MDM Rules/Calculators/A&P                           BP (!) 176/106   Pulse (!) 101   Temp (!) 100.5 F (38.1 C)   Resp 20   LMP 04/16/2021   SpO2 100%   Final Clinical Impression(s) / ED Diagnoses Final diagnoses:  Influenza A    Rx / DC Orders ED Discharge Orders     None      4:51 PM Patient here with flulike symptoms ongoing for the past 4 days.  Does have a oral temperature of 100.5 and is tachycardic with heart rate of 110.  Tachycardia likely secondary to ongoing fever.  A chest x-ray obtained showing finding compatible with fluid overload.  She does have history of CHF.  Viral respiratory panel positive for influenza A consistent with her presentation.  Fortunately she is not  hypoxic, her O2 sats is 99 to 100% on room air.  Marijane Rubendall was evaluated in Emergency Department on 04/30/2021 for the symptoms described in the history of present illness. She was evaluated in the context of the global COVID-19 pandemic, which necessitated consideration that the patient might be at risk for infection with the SARS-CoV-2 virus that causes COVID-19. Institutional protocols and algorithms that pertain to the evaluation of patients at risk for COVID-19 are in a state of rapid change based on information released by regulatory bodies including the CDC and federal and state organizations. These policies and algorithms were followed during the patient's care in the ED.    Fayrene Helper, PA-C 04/30/21 1759    Melene Plan, DO 04/30/21 1910

## 2021-04-30 NOTE — ED Triage Notes (Signed)
Pt arrived by [facility] from home for flu like symptoms x 3 days. Has bodyaches, headache, cough. Today had onset of n/v and reported seeing dark red blood in emesis.

## 2021-05-04 ENCOUNTER — Emergency Department (HOSPITAL_COMMUNITY): Payer: Medicaid Other

## 2021-05-04 ENCOUNTER — Encounter (HOSPITAL_COMMUNITY): Payer: Self-pay | Admitting: Emergency Medicine

## 2021-05-04 ENCOUNTER — Other Ambulatory Visit: Payer: Self-pay

## 2021-05-04 ENCOUNTER — Inpatient Hospital Stay (HOSPITAL_COMMUNITY)
Admission: EM | Admit: 2021-05-04 | Discharge: 2021-05-07 | DRG: 291 | Disposition: A | Discharge status: Home / Self Care | Payer: Medicaid Other | Attending: Internal Medicine | Admitting: Internal Medicine

## 2021-05-04 DIAGNOSIS — E119 Type 2 diabetes mellitus without complications: Secondary | ICD-10-CM

## 2021-05-04 DIAGNOSIS — E662 Morbid (severe) obesity with alveolar hypoventilation: Secondary | ICD-10-CM | POA: Diagnosis present

## 2021-05-04 DIAGNOSIS — F32A Depression, unspecified: Secondary | ICD-10-CM | POA: Diagnosis not present

## 2021-05-04 DIAGNOSIS — Z87891 Personal history of nicotine dependence: Secondary | ICD-10-CM

## 2021-05-04 DIAGNOSIS — Z6841 Body Mass Index (BMI) 40.0 and over, adult: Secondary | ICD-10-CM | POA: Diagnosis not present

## 2021-05-04 DIAGNOSIS — E873 Alkalosis: Secondary | ICD-10-CM | POA: Diagnosis not present

## 2021-05-04 DIAGNOSIS — Z20822 Contact with and (suspected) exposure to covid-19: Secondary | ICD-10-CM | POA: Diagnosis not present

## 2021-05-04 DIAGNOSIS — Z743 Need for continuous supervision: Secondary | ICD-10-CM | POA: Diagnosis not present

## 2021-05-04 DIAGNOSIS — E1165 Type 2 diabetes mellitus with hyperglycemia: Secondary | ICD-10-CM | POA: Diagnosis not present

## 2021-05-04 DIAGNOSIS — I5033 Acute on chronic diastolic (congestive) heart failure: Secondary | ICD-10-CM | POA: Diagnosis not present

## 2021-05-04 DIAGNOSIS — I248 Other forms of acute ischemic heart disease: Secondary | ICD-10-CM | POA: Diagnosis present

## 2021-05-04 DIAGNOSIS — Z7984 Long term (current) use of oral hypoglycemic drugs: Secondary | ICD-10-CM | POA: Diagnosis not present

## 2021-05-04 DIAGNOSIS — Z794 Long term (current) use of insulin: Secondary | ICD-10-CM | POA: Diagnosis not present

## 2021-05-04 DIAGNOSIS — Z7985 Long-term (current) use of injectable non-insulin antidiabetic drugs: Secondary | ICD-10-CM

## 2021-05-04 DIAGNOSIS — Z91030 Bee allergy status: Secondary | ICD-10-CM

## 2021-05-04 DIAGNOSIS — Z888 Allergy status to other drugs, medicaments and biological substances status: Secondary | ICD-10-CM | POA: Diagnosis not present

## 2021-05-04 DIAGNOSIS — R0602 Shortness of breath: Secondary | ICD-10-CM | POA: Diagnosis not present

## 2021-05-04 DIAGNOSIS — Z79899 Other long term (current) drug therapy: Secondary | ICD-10-CM

## 2021-05-04 DIAGNOSIS — R062 Wheezing: Secondary | ICD-10-CM | POA: Diagnosis not present

## 2021-05-04 DIAGNOSIS — Z8701 Personal history of pneumonia (recurrent): Secondary | ICD-10-CM | POA: Diagnosis not present

## 2021-05-04 DIAGNOSIS — R404 Transient alteration of awareness: Secondary | ICD-10-CM | POA: Diagnosis not present

## 2021-05-04 DIAGNOSIS — J111 Influenza due to unidentified influenza virus with other respiratory manifestations: Secondary | ICD-10-CM | POA: Diagnosis not present

## 2021-05-04 DIAGNOSIS — R6889 Other general symptoms and signs: Secondary | ICD-10-CM | POA: Diagnosis not present

## 2021-05-04 DIAGNOSIS — R069 Unspecified abnormalities of breathing: Secondary | ICD-10-CM | POA: Diagnosis not present

## 2021-05-04 DIAGNOSIS — T501X6A Underdosing of loop [high-ceiling] diuretics, initial encounter: Secondary | ICD-10-CM | POA: Diagnosis present

## 2021-05-04 DIAGNOSIS — J101 Influenza due to other identified influenza virus with other respiratory manifestations: Secondary | ICD-10-CM | POA: Diagnosis not present

## 2021-05-04 DIAGNOSIS — Z825 Family history of asthma and other chronic lower respiratory diseases: Secondary | ICD-10-CM

## 2021-05-04 DIAGNOSIS — J45909 Unspecified asthma, uncomplicated: Secondary | ICD-10-CM | POA: Diagnosis present

## 2021-05-04 DIAGNOSIS — I509 Heart failure, unspecified: Secondary | ICD-10-CM

## 2021-05-04 DIAGNOSIS — J9601 Acute respiratory failure with hypoxia: Secondary | ICD-10-CM | POA: Diagnosis present

## 2021-05-04 DIAGNOSIS — R0902 Hypoxemia: Secondary | ICD-10-CM | POA: Diagnosis not present

## 2021-05-04 DIAGNOSIS — I5030 Unspecified diastolic (congestive) heart failure: Secondary | ICD-10-CM | POA: Diagnosis present

## 2021-05-04 DIAGNOSIS — I499 Cardiac arrhythmia, unspecified: Secondary | ICD-10-CM | POA: Diagnosis not present

## 2021-05-04 DIAGNOSIS — Z818 Family history of other mental and behavioral disorders: Secondary | ICD-10-CM

## 2021-05-04 DIAGNOSIS — I110 Hypertensive heart disease with heart failure: Principal | ICD-10-CM | POA: Diagnosis present

## 2021-05-04 DIAGNOSIS — Z91128 Patient's intentional underdosing of medication regimen for other reason: Secondary | ICD-10-CM

## 2021-05-04 DIAGNOSIS — Z9114 Patient's other noncompliance with medication regimen: Secondary | ICD-10-CM | POA: Diagnosis not present

## 2021-05-04 DIAGNOSIS — E66813 Obesity, class 3: Secondary | ICD-10-CM | POA: Diagnosis present

## 2021-05-04 DIAGNOSIS — G4733 Obstructive sleep apnea (adult) (pediatric): Secondary | ICD-10-CM | POA: Diagnosis present

## 2021-05-04 DIAGNOSIS — Z833 Family history of diabetes mellitus: Secondary | ICD-10-CM

## 2021-05-04 DIAGNOSIS — Z91013 Allergy to seafood: Secondary | ICD-10-CM | POA: Diagnosis not present

## 2021-05-04 HISTORY — DX: Heart failure, unspecified: I50.9

## 2021-05-04 LAB — CBC WITH DIFFERENTIAL/PLATELET
Abs Immature Granulocytes: 0.02 10*3/uL (ref 0.00–0.07)
Basophils Absolute: 0 10*3/uL (ref 0.0–0.1)
Basophils Relative: 0 %
Eosinophils Absolute: 0 10*3/uL (ref 0.0–0.5)
Eosinophils Relative: 0 %
HCT: 38.3 % (ref 36.0–46.0)
Hemoglobin: 11.2 g/dL — ABNORMAL LOW (ref 12.0–15.0)
Immature Granulocytes: 0 %
Lymphocytes Relative: 19 %
Lymphs Abs: 1.4 10*3/uL (ref 0.7–4.0)
MCH: 26.8 pg (ref 26.0–34.0)
MCHC: 29.2 g/dL — ABNORMAL LOW (ref 30.0–36.0)
MCV: 91.6 fL (ref 80.0–100.0)
Monocytes Absolute: 0.8 10*3/uL (ref 0.1–1.0)
Monocytes Relative: 12 %
Neutro Abs: 5 10*3/uL (ref 1.7–7.7)
Neutrophils Relative %: 69 %
Platelets: 268 10*3/uL (ref 150–400)
RBC: 4.18 MIL/uL (ref 3.87–5.11)
RDW: 13.9 % (ref 11.5–15.5)
WBC: 7.2 10*3/uL (ref 4.0–10.5)
nRBC: 0 % (ref 0.0–0.2)

## 2021-05-04 LAB — BASIC METABOLIC PANEL
Anion gap: 8 (ref 5–15)
BUN: <5 mg/dL — ABNORMAL LOW (ref 6–20)
CO2: 32 mmol/L (ref 22–32)
Calcium: 8.9 mg/dL (ref 8.9–10.3)
Chloride: 95 mmol/L — ABNORMAL LOW (ref 98–111)
Creatinine, Ser: 0.76 mg/dL (ref 0.44–1.00)
GFR, Estimated: >60 mL/min (ref >60)
Glucose, Bld: 315 mg/dL — ABNORMAL HIGH (ref 70–99)
Potassium: 3.7 mmol/L (ref 3.5–5.1)
Sodium: 135 mmol/L (ref 135–145)

## 2021-05-04 LAB — BRAIN NATRIURETIC PEPTIDE: B Natriuretic Peptide: 189.2 pg/mL — ABNORMAL HIGH (ref 0.0–100.0)

## 2021-05-04 LAB — CBG MONITORING, ED
Glucose-Capillary: 286 mg/dL — ABNORMAL HIGH (ref 70–99)
Glucose-Capillary: 395 mg/dL — ABNORMAL HIGH (ref 70–99)

## 2021-05-04 LAB — RESP PANEL BY RT-PCR (FLU A&B, COVID) ARPGX2
Influenza A by PCR: POSITIVE — AB
Influenza B by PCR: NEGATIVE
SARS Coronavirus 2 by RT PCR: NEGATIVE

## 2021-05-04 LAB — TROPONIN I (HIGH SENSITIVITY)
Troponin I (High Sensitivity): 71 ng/L — ABNORMAL HIGH (ref <18)
Troponin I (High Sensitivity): 54 ng/L — ABNORMAL HIGH (ref <18)

## 2021-05-04 LAB — I-STAT BETA HCG BLOOD, ED (MC, WL, AP ONLY): I-stat hCG, quantitative: <5 m[IU]/mL (ref <5)

## 2021-05-04 MED ORDER — FUROSEMIDE 10 MG/ML IJ SOLN
120.0000 mg | Freq: Once | INTRAVENOUS | Status: AC
  Administered 2021-05-04: 120 mg via INTRAVENOUS
  Filled 2021-05-04: qty 2

## 2021-05-04 MED ORDER — IPRATROPIUM-ALBUTEROL 0.5-2.5 (3) MG/3ML IN SOLN
3.0000 mL | Freq: Once | RESPIRATORY_TRACT | Status: AC
  Administered 2021-05-04: 3 mL via RESPIRATORY_TRACT
  Filled 2021-05-04: qty 3

## 2021-05-04 MED ORDER — AMLODIPINE BESYLATE 10 MG PO TABS
10.0000 mg | ORAL_TABLET | Freq: Every day | ORAL | Status: DC
  Administered 2021-05-04 – 2021-05-07 (×4): 10 mg via ORAL
  Filled 2021-05-04: qty 2
  Filled 2021-05-04 (×3): qty 1

## 2021-05-04 MED ORDER — ALBUTEROL SULFATE (2.5 MG/3ML) 0.083% IN NEBU: 2.5000 mg | INHALATION_SOLUTION | RESPIRATORY_TRACT | Status: DC | PRN

## 2021-05-04 MED ORDER — INSULIN ASPART 100 UNIT/ML IJ SOLN
0.0000 [IU] | Freq: Three times a day (TID) | INTRAMUSCULAR | Status: DC
  Administered 2021-05-04 – 2021-05-05 (×2): 8 [IU] via SUBCUTANEOUS
  Administered 2021-05-05 (×2): 15 [IU] via SUBCUTANEOUS
  Administered 2021-05-06: 3 [IU] via SUBCUTANEOUS
  Administered 2021-05-06 (×2): 5 [IU] via SUBCUTANEOUS
  Administered 2021-05-07: 11 [IU] via SUBCUTANEOUS
  Administered 2021-05-07: 3 [IU] via SUBCUTANEOUS

## 2021-05-04 MED ORDER — ENOXAPARIN SODIUM 100 MG/ML IJ SOSY
90.0000 mg | PREFILLED_SYRINGE | INTRAMUSCULAR | Status: DC
  Administered 2021-05-04 – 2021-05-05 (×2): 90 mg via SUBCUTANEOUS
  Filled 2021-05-04: qty 0.9
  Filled 2021-05-04: qty 1

## 2021-05-04 MED ORDER — ALBUTEROL SULFATE HFA 108 (90 BASE) MCG/ACT IN AERS
2.0000 | INHALATION_SPRAY | RESPIRATORY_TRACT | Status: DC | PRN
  Administered 2021-05-04: 2 via RESPIRATORY_TRACT
  Filled 2021-05-04: qty 6.7

## 2021-05-04 MED ORDER — ACETAMINOPHEN 325 MG PO TABS
650.0000 mg | ORAL_TABLET | Freq: Four times a day (QID) | ORAL | Status: DC | PRN
  Administered 2021-05-05 (×3): 650 mg via ORAL
  Filled 2021-05-04 (×3): qty 2

## 2021-05-04 MED ORDER — MAGNESIUM SULFATE 2 GM/50ML IV SOLN
2.0000 g | Freq: Once | INTRAVENOUS | Status: AC
  Administered 2021-05-04: 2 g via INTRAVENOUS
  Filled 2021-05-04: qty 50

## 2021-05-04 MED ORDER — POLYETHYLENE GLYCOL 3350 17 G PO PACK: 17.0000 g | PACK | Freq: Every day | ORAL | Status: DC | PRN

## 2021-05-04 MED ORDER — INSULIN DETEMIR 100 UNIT/ML ~~LOC~~ SOLN
30.0000 [IU] | Freq: Every day | SUBCUTANEOUS | Status: DC
  Administered 2021-05-04: 30 [IU] via SUBCUTANEOUS
  Filled 2021-05-04 (×2): qty 0.3

## 2021-05-04 MED ORDER — AEROCHAMBER PLUS FLO-VU LARGE MISC
Status: AC
  Administered 2021-05-04: 1
  Filled 2021-05-04: qty 1

## 2021-05-04 MED ORDER — ACETAMINOPHEN 650 MG RE SUPP: 650.0000 mg | Freq: Four times a day (QID) | RECTAL | Status: DC | PRN

## 2021-05-04 NOTE — ED Notes (Signed)
At 1100 changed out oxygen tank

## 2021-05-04 NOTE — ED Provider Notes (Signed)
[facility] EMERGENCY DEPARTMENT Provider Note   CSN: [id number] Arrival date & time: 05/04/21  0155     History Chief Complaint  Patient presents with   Shortness of Breath   Asthma    Yvette Gibbs is a 30 y.o. female.  The history is provided by the patient and medical records. No language interpreter was used.  Shortness of Breath Severity:  Severe Onset quality:  Gradual Duration:  5 days Timing:  Constant Progression:  Worsening Chronicity:  New Context: URI (flu)   Relieved by:  Nothing Worsened by:  Coughing Ineffective treatments:  Inhaler Associated symptoms: chest pain (tightness), cough and wheezing   Associated symptoms: no abdominal pain, no diaphoresis, no fever (chills), no headaches, no hemoptysis, no neck pain, no rash, no sputum production (dry) and no vomiting   Risk factors: obesity   Risk factors: no family hx of DVT and no hx of PE/DVT   Asthma This is a chronic problem. The problem occurs constantly. The problem has not changed since onset.Associated symptoms include chest pain (tightness) and shortness of breath. Pertinent negatives include no abdominal pain and no headaches.      Past Medical History:  Diagnosis Date   Asthma    Depression    Diabetes mellitus type 2, uncontrolled    Extreme obesity    Family history of adverse reaction to anesthesia    " my grandmother had an allergic reaction   Hypertension    Mental disorder    Obesity    OSA (obstructive sleep apnea)     Patient Active Problem List   Diagnosis Date Noted   Acute asthma exacerbation 12/17/2020   Obstructive lung disease (HCC) 12/17/2020   Acute on chronic respiratory failure with hypoxia and hypercapnia (HCC) 12/17/2020   Heart failure with preserved ejection fraction (HCC) 10/01/2020   Obesity hypoventilation syndrome (HCC) 09/29/2020   Acute respiratory failure with hypoxia (HCC) 09/29/2020   Normocytic anemia 09/29/2020   Mild  concentric left ventricular hypertrophy (LVH) 09/29/2020   NAFLD (nonalcoholic fatty liver disease) 32/95/1884   Asthma exacerbation 09/28/2020   Acute respiratory failure due to COVID-19 (HCC) 05/21/2020   Gastroesophageal reflux disease without esophagitis 06/19/2019   Loud snoring 06/19/2019   Type 2 diabetes mellitus without complication, without long-term current use of insulin (HCC) 03/04/2019   Abdominal pain 03/03/2019   HTN (hypertension) 03/03/2019   Hyperglycemia 03/03/2019   Proteinuria 03/03/2019   HCAP (healthcare-associated pneumonia) 09/07/2015   Severe sepsis (HCC) 09/07/2015   Asthma 09/07/2015   Pleuritic chest pain 09/01/2015   Morbid obesity (HCC) 09/01/2015   OSA (obstructive sleep apnea) 09/01/2015   Influenza A 09/01/2015   Biliary colic 09/04/2014    Past Surgical History:  Procedure Laterality Date   APPENDECTOMY     CHOLECYSTECTOMY N/A 09/05/2014   Procedure: LAPAROSCOPIC CHOLECYSTECTOMY;  Surgeon: Axel Filler, MD;  Location: [facility] OR;  Service: General;  Laterality: N/A;   TONSILLECTOMY       OB History   No obstetric history on file.     Family History  Problem Relation Age of Onset   Diabetes Mellitus II Maternal Grandmother     Social History   Tobacco Use   Smoking status: Never   Smokeless tobacco: Never  Vaping Use   Vaping Use: Never used  Substance Use Topics   Alcohol use: Not Currently   Drug use: Not Currently    Types: Marijuana    Home Medications Prior to Admission medications  Medication Sig Start Date End Date Taking? Authorizing Provider  Accu-Chek Softclix Lancets lancets Use as instructed 10/08/20   Alexandria Lodge, MD  acetaminophen (TYLENOL) 500 MG tablet Take 2 tablets (1,000 mg total) by mouth every 6 (six) hours as needed for fever, mild pain, moderate pain or headache. 04/30/21   Domenic Moras, PA-C  albuterol (VENTOLIN HFA) 108 (90 Base) MCG/ACT inhaler Inhale 1-2 puffs into the lungs every 6 (six) hours as  needed for wheezing or shortness of breath. 03/21/19   Ladell Pier, MD  benzonatate (TESSALON) 100 MG capsule Take 1 capsule (100 mg total) by mouth every 8 (eight) hours. 04/30/21   Domenic Moras, PA-C  Dulaglutide (TRULICITY) [id number] [id number] SOPN Inject 0.75 mg into the skin once a week. 10/24/20   Kerin Perna, NP  famotidine (PEPCID) 20 MG tablet TAKE 1 TABLET BY MOUTH TWICE A DAY 01/20/21   Chesley Mires, MD  insulin glargine (LANTUS) 100 unit/mL SOPN Inject 45 Units into the skin daily. 02/20/21   Lorin Glass, PA-C  Insulin Pen Needle (PEN NEEDLES 29GX1/2") 29G X 12MM MISC Use as directed 02/20/21   Lorin Glass, PA-C  metFORMIN (GLUCOPHAGE) 1000 MG tablet Take 1 tablet (1,000 mg total) by mouth 2 (two) times daily with a meal. 10/24/20   Kerin Perna, NP  ondansetron (ZOFRAN ODT) 4 MG disintegrating tablet Take 1 tablet (4 mg total) by mouth every 8 (eight) hours as needed for nausea or vomiting. 04/22/21   Blue, Soijett A, PA-C  pantoprazole (PROTONIX) 40 MG tablet TAKE 1 TABLET BY MOUTH EVERY DAY 01/20/21   Chesley Mires, MD  torsemide 40 MG TABS Take 40 mg by mouth 2 (two) times daily. 10/08/20   Alexandria Lodge, MD    Allergies    Bee pollen, Fish allergy, and Lisinopril  Review of Systems   Review of Systems  Constitutional:  Positive for chills and fatigue. Negative for diaphoresis and fever (chills).  HENT:  Positive for congestion.   Eyes:  Negative for visual disturbance.  Respiratory:  Positive for cough, chest tightness, shortness of breath and wheezing. Negative for hemoptysis and sputum production (dry).   Cardiovascular:  Positive for chest pain (tightness) and leg swelling (chronic). Negative for palpitations.  Gastrointestinal:  Negative for abdominal pain, constipation, diarrhea, nausea and vomiting.  Genitourinary:  Negative for dysuria.  Musculoskeletal:  Negative for back pain, neck pain and neck stiffness.  Skin:  Negative for rash.   Neurological:  Negative for dizziness, light-headedness, numbness and headaches.  Psychiatric/Behavioral:  Negative for agitation and confusion.   All other systems reviewed and are negative.  Physical Exam Updated Vital Signs BP 131/82 (BP Location: Right Wrist)   Pulse 96   Temp 98.2 F (36.8 C) (Oral)   Resp (!) 22   Ht 5\' 2"  (1.575 m)   Wt (!) 185 kg   LMP 05/01/2021   SpO2 96%   BMI 74.60 kg/m   Physical Exam Vitals and nursing note reviewed.  Constitutional:      General: She is not in acute distress.    Appearance: She is well-developed. She is ill-appearing. She is not toxic-appearing or diaphoretic.  HENT:     Head: Normocephalic and atraumatic.     Mouth/Throat:     Mouth: Mucous membranes are moist.  Eyes:     Conjunctiva/sclera: Conjunctivae normal.     Pupils: Pupils are equal, round, and reactive to light.  Cardiovascular:     Rate and Rhythm: Normal  rate and regular rhythm.     Heart sounds: No murmur heard. Pulmonary:     Effort: Pulmonary effort is normal. No respiratory distress.     Breath sounds: Wheezing, rhonchi and rales present.  Chest:     Chest wall: No tenderness.  Abdominal:     Palpations: Abdomen is soft.     Tenderness: There is no abdominal tenderness.  Musculoskeletal:        General: No swelling.     Cervical back: Neck supple.     Right lower leg: Edema present.     Left lower leg: Edema present.  Skin:    General: Skin is warm and dry.     Capillary Refill: Capillary refill takes less than 2 seconds.     Findings: No erythema.  Neurological:     General: No focal deficit present.     Mental Status: She is alert.     Cranial Nerves: No cranial nerve deficit.  Psychiatric:        Mood and Affect: Mood normal.    ED Results / Procedures / Treatments   Labs (all labs ordered are listed, but only abnormal results are displayed) Labs Reviewed  RESP PANEL BY RT-PCR (FLU A&B, COVID) ARPGX2 - Abnormal; Notable for the  following components:      Result Value   Influenza A by PCR POSITIVE (*)    All other components within normal limits  CBC WITH DIFFERENTIAL/PLATELET - Abnormal; Notable for the following components:   Hemoglobin 11.2 (*)    MCHC 29.2 (*)    All other components within normal limits  BASIC METABOLIC PANEL - Abnormal; Notable for the following components:   Chloride 95 (*)    Glucose, Bld 315 (*)    BUN <5 (*)    All other components within normal limits  BRAIN NATRIURETIC PEPTIDE  I-STAT BETA HCG BLOOD, ED ([facility], [facility], [facility] ONLY)  TROPONIN I (HIGH SENSITIVITY)    EKG EKG Interpretation  Date/Time:  Sunday May 04 2021 02:15:59 EST Ventricular Rate:  116 PR Interval:  144 QRS Duration: 110 QT Interval:  348 QTC Calculation: 483 R Axis:   -21 Text Interpretation: Sinus tachycardia Incomplete right bundle branch block Borderline ECG When compared to prior, faster rate. No STEMI Confirmed by Theda Belfast ([id number]) on 05/04/2021 12:04:45 PM  Radiology DG Chest 2 View  Result Date: 05/04/2021 CLINICAL DATA:  Shortness of breath EXAM: CHEST - 2 VIEW COMPARISON:  04/30/2021 FINDINGS: Check shadow is mildly enlarged but stable. Lungs are well aerated bilaterally. Increasing vascular congestion is noted with patchy airspace opacities likely representing edema. No confluent infiltrate is seen. No bony abnormality is noted. IMPRESSION: Changes consistent with fluid overload with vascular congestion and edema. Electronically Signed   By: Alcide Clever M.D.   On: 05/04/2021 02:52    Procedures Procedures   Medications Ordered in ED Medications  albuterol (VENTOLIN HFA) 108 (90 Base) MCG/ACT inhaler 2 puff (2 puffs Inhalation Given 05/04/21 1238)  AeroChamber Plus Flo-Vu Large MISC (1 each  Given 05/04/21 1240)  magnesium sulfate IVPB 2 g 50 mL (0 g Intravenous Stopped 05/04/21 1501)  ipratropium-albuterol (DUONEB) 0.5-2.5 (3) MG/3ML nebulizer solution 3 mL (3 mLs Nebulization Given  05/04/21 1419)    ED Course  I have reviewed the triage vital signs and the nursing notes.  Pertinent labs & imaging results that were available during my care of the patient were reviewed by me and considered in my medical decision making (see  chart for details).  Clinical Course as of 05/04/21 1250  Sun May 04, 2021  0832 Influenza A By PCR(!): POSITIVE [GL]  [phone] CXR with fluid overload concern [GL]    Clinical Course User Index [GL] Loeffler, Adora Fridge, PA-C   MDM Rules/Calculators/A&P                           Mackensey Tumminello is a 30 y.o. female with a past medical history significant for obesity, asthma, hypertension, asthma, COPD, sleep apnea, CHF, and recent diagnosis of influenza who presents with worsening shortness of breath and chest tightness.  According to patient, she was diagnosed with influenza 4 days ago and as her oxygen saturations were normal she was able to go home.  She reports since then they have not continued to worsen and today she had oxygen saturations that she reports were in the 20s.  I do not have any documentation showing that oxygen saturations were indeed in the 20s however patient was on 3 L nasal cannula to maintain oxygen saturations in the 90s.  She was given Solu-Medrol and several doses of DuoNeb in route with EMS due to wheezing, rhonchi, and rales with EMS.  Patient was afebrile but is complaining of chest tightness and shortness of breath.  Patient says she does not have any constipation, diarrhea, dysuria, or any production of her cough.  It is a dry cough.  She denies any chest pain but reports she is having tightness.  She denies any nausea, vomiting, constipation, or diarrhea at this time.  She says her legs are slightly more edematous than normal but denies any history of DVT or PE.  She is not tachycardic on my evaluation but is tachypneic.  On exam, she has rales, rhonchi, and wheezing in all lung fields.  Chest and abdomen were nontender.   She does have tenderness in her flanks which she reports she has due to her breathing fast and coughing frequently.  Legs are edematous which she reports is slightly worse than her baseline.  Back is nontender.  No focal neurologic deficits initially.  As patient is on 3 L I do feel she will likely need admission as she does not take oxygen at home.  Patient was found to be still fluid positive on retest.  X-rayed does not describe pneumonia but does show vascular congestion and edema.  Given her reported worsened edema in the legs, will add on a BNP and troponin.  EKG did not show STEMI.  Patient given magnesium to add to the steroids and breathing treatments have before.  We will give another DuoNeb.  Patient's labs otherwise showed she is not pregnant, CBC shows only mild anemia with no leukocytosis and metabolic panel shows normal kidney function.  She does not have COVID.  I anticipate admission shortly after her troponin and BNP are completed for hypoxia in the setting of chronic lung disease and now influenza.  3:27 PM Troponin and BNP slightly elevated.  I suspect it is more demand ischemia in the setting of the positive influenza test.  Given lack of DVT or PE and her positive flu test with more generalized symptoms I have less suspicion for a thromboembolic etiology of symptoms.  Given the elevated BNP I do think is reasonable to consider echo after she is admitted but will discuss with medicine team if they want [place] to order a CT PE study or not.  Viral related myocarditis also  considered however she is not hypotensive and otherwise is appearing well now that she is on several liters of oxygen.  Medicine team called for admission.   Final Clinical Impression(s) / ED Diagnoses Final diagnoses:  Influenza  Hypoxia  Wheezing  Shortness of breath     Clinical Impression: 1. Influenza   2. Hypoxia   3. Wheezing   4. Shortness of breath     Disposition: Admit  This note was  prepared with assistance of Dragon voice recognition software. Occasional wrong-word or sound-a-like substitutions may have occurred due to the inherent limitations of voice recognition software.     Opal Dinning, Gwenyth Allegra, MD 05/04/21 [phone]

## 2021-05-04 NOTE — ED Triage Notes (Signed)
Patient arrived with EMS from home reports SOB/Asthma this evening with wheezing/rales and fine crackles on both lung fields , unrelieved by inhaler, she received Solumedrol 125 mg IV and 2 doses of Duoneb nebulizer treatments by EMS .

## 2021-05-04 NOTE — H&P (Signed)
Date: 05/04/2021               Patient Name:  Yvette Gibbs MRN: [id number]  DOB: Nov 27, 1990 Age / Sex: 30 y.o., female   PCP: Grayce Sessions, NP         Medical Service: Internal Medicine Teaching Service         Attending Physician: Dr. Reymundo Poll, MD    First Contact: Dr. Elza Rafter Pager: [phone]  Second Contact: Dr. Doran Stabler Pager: [phone]       After Hours (After 5p/  First Contact Pager: [phone]  weekends / holidays): Second Contact Pager: [phone]   Chief Complaint: shortness of breath  History of Present Illness:   Yvette Gibbs is a 30 year old female living with morbid obesity, type 2 diabetes, hypertension, OSA, HFpEF who presents to the ED for shortness of breath.  She presented to the ED 4 days ago for body aches, chills and fever and tested positive for flu.  She was discharged home given her normal vital sign.  Patient reports that she has been feeling worse since that day with shortness of breath and sweating.  Endorses fever, chills and muscle pain.  For shortness of breath, she endorses frequent coughs with sputum.  Also reports wheezing which is within normal.  She reports increase in lower extremity edema, abdominal distention and worsening orthopnea.  Patient reports that she normally takes 2 tablets of torsemide twice daily.  She only takes a few pills in the last few days due to not feeling well.  However her prescription of torsemide said 1 tablets twice daily.  It looks like she only had a prescription for 30 days after her admission in April.  She also endorses chest pain that started yesterday and this morning.  Described pain as pressure, started when she was resting, radiates to her shoulder blade, lower back and around her rib cage.  Pain is worse with sitting up and coughing.  Tylenol and Motrin helps with the pain.  Patient was admitted in July 2022 for acute hypoxic respiratory failure for sepsis/asthma exacerbation.  She was  treated with steroids, duo nebs and antibiotics.  In the ED, her initial blood pressure was 144/103 with heart rate 106.  Satting well on 2 L nasal cannula.  CBC did not show any leukocytosis.  BMP was unremarkable except for hyperglycemia of 315.  She was given DuoNebs with mag sulfate for possible asthma exacerbation.   Meds:  Current Meds  Medication Sig   acetaminophen (TYLENOL) 500 MG tablet Take 2 tablets (1,000 mg total) by mouth every 6 (six) hours as needed for fever, mild pain, moderate pain or headache.   albuterol (VENTOLIN HFA) 108 (90 Base) MCG/ACT inhaler Inhale 1-2 puffs into the lungs every 6 (six) hours as needed for wheezing or shortness of breath.   benzonatate (TESSALON) 100 MG capsule Take 1 capsule (100 mg total) by mouth every 8 (eight) hours.   Dulaglutide (TRULICITY) 0.75 MG/0.5ML SOPN Inject 0.75 mg into the skin once a week.   liraglutide (VICTOZA) 18 MG/3ML SOPN Inject 0.6 mg into the skin daily.   ondansetron (ZOFRAN ODT) 4 MG disintegrating tablet Take 1 tablet (4 mg total) by mouth every 8 (eight) hours as needed for nausea or vomiting.   torsemide (DEMADEX) 20 MG tablet Take 20 mg by mouth daily.     Allergies: Allergies as of 05/04/2021 - Review Complete 05/04/2021  Allergen Reaction Noted   Bee pollen Other (See  Comments) 02/20/2021   Fish allergy Itching and Rash 09/02/2015   Lisinopril Rash 06/19/2019   Past Medical History:  Diagnosis Date   Acute exacerbation of CHF (congestive heart failure) (HCC) 05/04/2021   Asthma    Depression    Diabetes mellitus type 2, uncontrolled    Extreme obesity    Family history of adverse reaction to anesthesia    " my grandmother had an allergic reaction   Hypertension    Mental disorder    Obesity    OSA (obstructive sleep apnea)     Family History:  Family History  Problem Relation Age of Onset   Diabetes Mellitus II Maternal Grandmother   Mother: asthma, depression   Social History:  Not drink  alcohol Quit smoking 4 years ago Any drug use Lives at home with grandma  Review of Systems: A complete ROS was negative except as per HPI.   Physical Exam: Blood pressure (!) 161/78, pulse 92, temperature 98.2 F (36.8 C), temperature source Oral, resp. rate 20, height 5\' 2"  (1.575 m), weight (!) 185 kg, last menstrual period 05/01/2021, SpO2 96 %. Physical Exam Constitutional:      General: She is not in acute distress.    Appearance: She is obese.  HENT:     Head: Normocephalic.     Mouth/Throat:     Mouth: Mucous membranes are moist.  Eyes:     General:        Right eye: No discharge.        Left eye: No discharge.     Conjunctiva/sclera: Conjunctivae normal.  Cardiovascular:     Rate and Rhythm: Normal rate and regular rhythm.     Heart sounds: No murmur heard.    Comments: Could not appreciate JVD given body habitus. Trace LE edema Pulmonary:     Effort: Pulmonary effort is normal. No respiratory distress.     Breath sounds: Wheezing (Mild diffuse wheezing) present.     Comments: Diminished breath sound bilateral lung bases.  Mild wheezing diffusely. Chest:     Chest wall: Tenderness (Tenderness to palpation and in the left chest wall as well as bilateral rib cage at the axillary line.) present.  Abdominal:     General: Bowel sounds are normal. There is no distension.     Palpations: Abdomen is soft.     Tenderness: There is no abdominal tenderness.  Musculoskeletal:        General: Normal range of motion.     Cervical back: Normal range of motion.  Skin:    General: Skin is warm.     Coloration: Skin is not jaundiced.  Neurological:     General: No focal deficit present.     Mental Status: She is alert and oriented to person, place, and time.  Psychiatric:        Mood and Affect: Mood normal.        Behavior: Behavior normal.      EKG: personally reviewed my interpretation is sinus tachycardia.  Left axis deviation with incomplete right bundle branch  block.   CXR: personally reviewed my interpretation is increased vascular congestion with pulmonary edema  Assessment & Plan by Problem: Principal Problem:   Acute exacerbation of CHF (congestive heart failure) (HCC) Active Problems:   OSA (obstructive sleep apnea)   Heart failure with preserved ejection fraction (HCC)  Yvette Gibbs is a 30 year old female living with morbid obesity, type 2 diabetes, hypertension, OSA, HFpEF who was admitted for acute hypoxic respiratory failure  secondary to acute HFpEF exacerbation.  Acute hypoxic respiratory failure Acute HFpEF exacerbation Chest x-ray and BNP are consistent with volume overload picture.  Her weights on 11/8 with 380 lbs and up to 407 lbs today.  The etiology of her exacerbation is likely due to nonadherence to torsemide.  However I did not see any refill of torsemide in the chart after her hospitalization in April.  We will call pharmacy in the morning to confirm. Low suspicion for PE at this time given the resolution of her tachycardia. -Start IV Lasix 120 mg  -Strict I/O and daily weights -Repeat echocardiogram  Chest pain Atypical presentation.  ACS rule out with downtrending troponin.  EKG showed left axis deviation with incomplete RBBB which are not new.  Suspect muscle strain in the setting of frequent coughing.  Her dyspnea can cause her chest pain as well. -Tylenol as needed for pain  Obesity hypoventilation syndrome Obstructive sleep apnea Her chart review reports history of asthma.  However most recent PFT did not suggest asthma with no response of bronchodilator.  It is more consistent with restrictive lung disease 2/2 obesity hypoventilation syndrome (normal DLCO). She is also had a positive sleep study.  Her BiPAP has not been working since September reportedly.  She is follow-up with pulmonology. -BiPAP at night -Albuterol as needed -Will hold additional steroids for now  Type 2 diabetes Last A1c of 8.8 in July.   Patient reports taking Trulicity and states that her metformin was discontinued during last hospitalization.  She does not remember if she is taking insulin at home.  The discharge summary in July states to continue Trulicity and metformin with Lantus 55 units BID.  -Start Levemir 30 units at bedtime with sliding scale -Will call pharmacy tomorrow to confirm -Repeat A1c  Hypertension Discharge summary in July at state that she is on amlodipine-olmesartan 10/40 mg.  This is not in her home med list. -Will start amlodipine 10 mg for hypertension  Full code IVF: N/A Diet: Heart DVT: Lovenox  Dispo: Admit patient to Inpatient with expected length of stay greater than 2 midnights.  SignedDoran Stabler, DO 05/04/2021, 5:44 PM  Pager: [phone] After 5pm on weekdays and 1pm on weekends: On Call pager: [phone]

## 2021-05-04 NOTE — ED Notes (Signed)
Placed on home bipap settings at this time by RT

## 2021-05-04 NOTE — ED Notes (Addendum)
Call PT X2 for vitals PT was sleeping.

## 2021-05-05 ENCOUNTER — Encounter (HOSPITAL_COMMUNITY): Payer: Self-pay | Admitting: Internal Medicine

## 2021-05-05 ENCOUNTER — Inpatient Hospital Stay (HOSPITAL_COMMUNITY): Payer: Medicaid Other

## 2021-05-05 DIAGNOSIS — J111 Influenza due to unidentified influenza virus with other respiratory manifestations: Secondary | ICD-10-CM

## 2021-05-05 DIAGNOSIS — J9601 Acute respiratory failure with hypoxia: Secondary | ICD-10-CM | POA: Diagnosis not present

## 2021-05-05 DIAGNOSIS — I5033 Acute on chronic diastolic (congestive) heart failure: Secondary | ICD-10-CM | POA: Diagnosis not present

## 2021-05-05 DIAGNOSIS — G4733 Obstructive sleep apnea (adult) (pediatric): Secondary | ICD-10-CM | POA: Diagnosis not present

## 2021-05-05 DIAGNOSIS — R0902 Hypoxemia: Secondary | ICD-10-CM

## 2021-05-05 LAB — BASIC METABOLIC PANEL WITH GFR
Anion gap: 11 (ref 5–15)
BUN: 13 mg/dL (ref 6–20)
CO2: 33 mmol/L — ABNORMAL HIGH (ref 22–32)
Calcium: 8.5 mg/dL — ABNORMAL LOW (ref 8.9–10.3)
Chloride: 91 mmol/L — ABNORMAL LOW (ref 98–111)
Creatinine, Ser: 0.9 mg/dL (ref 0.44–1.00)
GFR, Estimated: >60 mL/min
Glucose, Bld: 304 mg/dL — ABNORMAL HIGH (ref 70–99)
Potassium: 5.1 mmol/L (ref 3.5–5.1)
Sodium: 135 mmol/L (ref 135–145)

## 2021-05-05 LAB — GLUCOSE, CAPILLARY
Glucose-Capillary: 365 mg/dL — ABNORMAL HIGH (ref 70–99)
Glucose-Capillary: 358 mg/dL — ABNORMAL HIGH (ref 70–99)
Glucose-Capillary: 430 mg/dL — ABNORMAL HIGH (ref 70–99)

## 2021-05-05 LAB — CBC
HCT: 37.8 % (ref 36.0–46.0)
Hemoglobin: 10.8 g/dL — ABNORMAL LOW (ref 12.0–15.0)
MCH: 25.9 pg — ABNORMAL LOW (ref 26.0–34.0)
MCHC: 28.6 g/dL — ABNORMAL LOW (ref 30.0–36.0)
MCV: 90.6 fL (ref 80.0–100.0)
Platelets: 285 10*3/uL (ref 150–400)
RBC: 4.17 MIL/uL (ref 3.87–5.11)
RDW: 14.1 % (ref 11.5–15.5)
WBC: 6.6 10*3/uL (ref 4.0–10.5)
nRBC: 0 % (ref 0.0–0.2)

## 2021-05-05 LAB — ECHOCARDIOGRAM COMPLETE
AR max vel: 2.08 cm2
AV Peak grad: 7.2 mmHg
Ao pk vel: 1.34 m/s
Area-P 1/2: 3.95 cm2
Calc EF: 62.8 %
Height: 62 in
S' Lateral: 3.1 cm
Single Plane A2C EF: 62.4 %
Single Plane A4C EF: 64.7 %
Weight: 6525.62 [oz_av]

## 2021-05-05 LAB — MAGNESIUM: Magnesium: 1.9 mg/dL (ref 1.7–2.4)

## 2021-05-05 LAB — CBG MONITORING, ED: Glucose-Capillary: 278 mg/dL — ABNORMAL HIGH (ref 70–99)

## 2021-05-05 LAB — HEMOGLOBIN A1C
Hgb A1c MFr Bld: 8.8 % — ABNORMAL HIGH (ref 4.8–5.6)
Mean Plasma Glucose: 205.86 mg/dL

## 2021-05-05 MED ORDER — INSULIN DETEMIR 100 UNIT/ML ~~LOC~~ SOLN
35.0000 [IU] | Freq: Every day | SUBCUTANEOUS | Status: DC
  Filled 2021-05-05: qty 0.35

## 2021-05-05 MED ORDER — ALBUTEROL SULFATE (2.5 MG/3ML) 0.083% IN NEBU: 2.5000 mg | INHALATION_SOLUTION | RESPIRATORY_TRACT | Status: DC | PRN

## 2021-05-05 MED ORDER — FUROSEMIDE 10 MG/ML IJ SOLN
80.0000 mg | Freq: Two times a day (BID) | INTRAMUSCULAR | Status: DC
  Administered 2021-05-05 – 2021-05-07 (×4): 80 mg via INTRAVENOUS
  Filled 2021-05-05 (×4): qty 8

## 2021-05-05 MED ORDER — INSULIN ASPART 100 UNIT/ML IJ SOLN
2.0000 [IU] | Freq: Three times a day (TID) | INTRAMUSCULAR | Status: DC
  Administered 2021-05-05 (×2): 2 [IU] via SUBCUTANEOUS

## 2021-05-05 MED ORDER — PERFLUTREN LIPID MICROSPHERE
1.0000 mL | INTRAVENOUS | Status: AC | PRN
Start: 2021-05-05 — End: 2021-05-05
  Administered 2021-05-05: 2 mL via INTRAVENOUS
  Filled 2021-05-05: qty 10

## 2021-05-05 MED ORDER — INSULIN DETEMIR 100 UNIT/ML ~~LOC~~ SOLN
40.0000 [IU] | Freq: Every day | SUBCUTANEOUS | Status: DC
  Administered 2021-05-05: 40 [IU] via SUBCUTANEOUS
  Filled 2021-05-05 (×2): qty 0.4

## 2021-05-05 MED ORDER — INSULIN ASPART 100 UNIT/ML IJ SOLN
6.0000 [IU] | Freq: Three times a day (TID) | INTRAMUSCULAR | Status: DC
  Administered 2021-05-05: 6 [IU] via SUBCUTANEOUS

## 2021-05-05 NOTE — Progress Notes (Signed)
HD#1 SUBJECTIVE:  Patient Summary: Yvette Gibbs is a 30 y.o. with a pertinent PMH of HTN, T2DM, OSA, HFpEF, and morbid obesity who presented with shortness of breath and admitted for acute on chronic HFpEF.   Overnight Events: No acute events overnight  Interim History: This is hospital day 1 for Yvette Gibbs who was seen and evaluated at the bedside this morning. She states that her breathing feels fine. She did have an episode of chest pain yesterday, which she described as an elephant stepping on her chest, however, she states that this went away after about 10-20 minutes. Denies any further episodes of chest pain/tightness. Does still endorse orthopnea and lower extremity edema.   OBJECTIVE:  Vital Signs: Vitals:   05/05/21 0335 05/05/21 0355 05/05/21 0500 05/05/21 0600  BP:   (!) 144/97   Pulse: 78 80 82 93  Resp:   18   Temp:      TempSrc:      SpO2: 99% 100% 97% 92%  Weight:      Height:       Supplemental O2: Nasal Cannula SpO2: 92 % O2 Flow Rate (L/min): 2.5 L/min  Filed Weights   05/04/21 0205  Weight: (!) 185 kg     Intake/Output Summary (Last 24 hours) at 05/05/2021 0729 Last data filed at 05/05/2021 0015 Gross per 24 hour  Intake 81.48 ml  Output 225 ml  Net -143.52 ml   Net IO Since Admission: -143.52 mL [05/05/21 0729]  Physical Exam: General: No acute distress. Obese abdomen Head: Normocephalic. Atraumatic. CV: RRR. No murmurs, rubs, or gallops. No LE edema Pulmonary: Diminished breath sounds bilateral lung bases. No crackles. Abdominal: Soft, nontender, nondistended. Normal bowel sounds. Extremities: Palpable radial and DP pulses. Normal ROM. Skin: Warm and dry. No obvious rash or lesions. Neuro: A&Ox3. No focal deficit. Psych: Normal mood and affect     ASSESSMENT/PLAN:  Assessment: Principal Problem:   Acute exacerbation of CHF (congestive heart failure) (HCC) Active Problems:   OSA (obstructive sleep apnea)   Heart failure  with preserved ejection fraction (HCC)   Plan: #Acute hypoxic respiratory failure 2/2 acute on chronic HFpEF #Influenza A CXR and BNP consistent with volume overload. Last echo with EF of 55-60%. Weight up to 407 lbs on admission, and on 11/8, weight was 380 lbs. Received 1 dose of IV lasix 120 mg yesterday with unrecorded urine output. Echo this admission with EF of 55-60% and normal biventricular function without evidence of significant valvular heart disease. Etiology of heart failure exacerbation likely secondary to poor adherence to torsemide regimen, as well as influenza A. Will hold off on starting Tamiflu, as the patient is >48 hrs since symptom onset and clinically is improving.  - IV diuresis lasix 80 mg bid - Strict I&Os and daily weights  #Obstructive sleep apnea #Obesity hypoventilation syndrome PFTs most recently consistent with restrictive lung disease 2/2 obesity hypoventilation syndrome with a positive sleep study. Patient has a BiPAP at home, however, it has not been working the past 2 months. - BiPAP qhs   #Type 2 diabetes A1c 8.8. The patient takes Trulicity and Victoza at home and is unsure if she takes insulin. Last discharge summary from July, patient was sent home with Trulicity, metformin, and latnus 55u BID.  - Levemir 30u qhs - Will need med rec prior to discharge (likely d/c trulicity and continue victoza)  #Hypertension Patient is unsure if she has been taking amlodipine-olmesartan 10-40 mg daily. BP 144/97 this morning. - Continue  amlodipine 10 mg daily   Best Practice: Diet: Cardiac diet IVF: Fluids: none VTE: Lovenox Code: Full AB: None Family Contact: grandmother, called and notified. DISPO: Anticipated discharge to Home pending Medical stability.  Signature: Elza Rafter, D.O.  Internal Medicine Resident, PGY-1 [facility]  Pager: [phone] 7:29 AM, 05/05/2021   Please contact the on call pager after 5 pm and on  weekends at [phone].

## 2021-05-05 NOTE — Progress Notes (Addendum)
Inpatient Diabetes Program Recommendations  AACE/ADA: New Consensus Statement on Inpatient Glycemic Control (2015)  Target Ranges:  Prepandial:   less than 140 mg/dL      Peak postprandial:   less than 180 mg/dL (1-2 hours)      Critically ill patients:  140 - 180 mg/dL   Lab Results  Component Value Date   GLUCAP 365 (H) 05/05/2021   HGBA1C 8.8 (H) 05/05/2021    Review of Glycemic Control  Latest Reference Range & Units 12/21/20 07:48 12/21/20 11:26 02/20/21 14:47 05/04/21 17:16 05/04/21 22:15 05/05/21 08:10 05/05/21 12:49  Glucose-Capillary 70 - 99 mg/dL 591 (H) 638 (H) 466 (H) 286 (H) 395 (H) 278 (H) 365 (H)   Diabetes history: DM 2 Outpatient Diabetes medications:  Trulicity 0.75 weekly Metformin 1000 mg bid, Lantus (patient not taking metformin or Lantus) Current orders for Inpatient glycemic control:  Novolog moderate tid with meals Novolog 2 units tid with meals Levemir 35 units q HS Inpatient Diabetes Program Recommendations:    May consider increasing Novolog meal coverage to 6 units tid with meals.    Spoke with patient regarding DM management. She seems unclear to what she is taking at home for her DM.  She told the residents that she was taking both Victoza and Trulicity, however this is not listed on patients medications.   Her last "video visit" with [name], NP on 12/24/20 indicated that she was taking Lantus 55 units bid, Novolog 10 units tid with meals, Metformin 1000 mg bid, and Trulicity 0.75 mg weekly.    Patient is asking if Trulicity dose can be increased to increase her weight loss abilities.  She also states that she is not hungry when she wakes up, and wonders if there is a "pill" for this?  Will ask dietician to see patient to discuss potential easy low CHO options for breakfast, etc?  Discussed with resident. Patient prefers insulin pen for administration of insulin.  Patient also states that she needs a new "glucose meter".   -Patient will  definitely need clarification of all meds at d/c to help with DM management and also close f/u with PCP.  Will follow.   Thanks,  Beryl Meager, RN, BC-ADM Inpatient Diabetes Coordinator Pager [phone]  (8a-5p)

## 2021-05-05 NOTE — ED Notes (Signed)
Breakfast orders placed 

## 2021-05-05 NOTE — Plan of Care (Signed)

## 2021-05-05 NOTE — Progress Notes (Signed)
Heart Failure Nurse Navigator Progress Note  PCP: Kerin Perna, NP PCP-Cardiologist: none Admission Diagnosis: A CHF Admitted from: hotel   Presentation:   Yvette Gibbs presented 11/20 for increased SOB. Pt sitting in recliner on 2LPM per [place] with feet dangle. Pt interactive with interview process. Pt states she lives at [place], moves tomorrow 11/22 into apt with grandmother, then moving into her own apartment in late December. Pt states she does not take her "water pill" as prescribed 3x/day when she works at [employer] d/t having to void all the time. Pt states she takes all other medications as prescribed.  Pt requested to speak with CSW/RNCM regarding information about transportation and other medicaid questions. Pt does not drive. Pt states she has food at home and has food stamps, but sometimes she does not have enough money for food. Explained low sodium and fluid modifications. Pt states she drink a lot of water at [employer].  Pt requested notebook to keep log of weights, blood sugars, fluid and urine. Pt asked for reading book as she is getting bored. Gave pt pillbox, notepad, and reading book. Will ask attending team to write prescription for weight scale from [place] since she is on Medicaid.  Explained benefits of HV TOC clinic, pt agreeable.   ECHO/ LVEF: 55-60%, mild LVH.   Clinical Course:  Past Medical History:  Diagnosis Date   Acute exacerbation of CHF (congestive heart failure) ([place]) 05/04/2021   Asthma    Depression    Diabetes mellitus type 2, uncontrolled    Extreme obesity    Family history of adverse reaction to anesthesia    " my grandmother had an allergic reaction   Hypertension    Mental disorder    Obesity    OSA (obstructive sleep apnea)      Social History   Socioeconomic History   Marital status: Significant Other    Spouse name: Not on file   Number of children: 0   Years of education: Not on file   Highest education level: 11th  grade  Occupational History   Occupation: Research scientist (medical) at [employer]  Tobacco Use   Smoking status: Former    Packs/day: 0.50    Years: 10.00    Pack years: 5.00    Types: Cigarettes    Quit date: 06/15/2018    Years since quitting: 2.8   Smokeless tobacco: Never  Vaping Use   Vaping Use: Former   Quit date: 06/02/2020   Substances: Nicotine, Flavoring  Substance and Sexual Activity   Alcohol use: Not Currently   Drug use: Not Currently    Types: Marijuana    Comment: 10/2020 last use   Sexual activity: Yes    Partners: Male    Birth control/protection: Condom  Other Topics Concern   Not on file  Social History Narrative   Not on file   Social Determinants of Health   Financial Resource Strain: Low Risk    Difficulty of Paying Living Expenses: Not very hard  Food Insecurity: Food Insecurity Present   Worried About Charity fundraiser in the Last Year: Sometimes true   Arboriculturist in the Last Year: Sometimes true  Transportation Needs: Unmet Transportation Needs   Lack of Transportation (Medical): Yes   Lack of Transportation (Non-Medical): Yes  Physical Activity: Not on file  Stress: Not on file  Social Connections: Not on file    High Risk Criteria for Readmission and/or Poor Patient Outcomes: Heart failure hospital  admissions (last 6 months): 1, multiple ED visits  No Show rate: 20% Difficult social situation: yes Demonstrates medication adherence: NO Primary Language: English Literacy level: able to read/write and comprehend.   Education Assessment and Provision:  Detailed education and instructions provided on heart failure disease management including the following:  Signs and symptoms of Heart Failure When to call the physician Importance of daily weights Low sodium diet Fluid restriction Medication management Anticipated future follow-up appointments  Patient education given on each of the above topics.  Patient acknowledges understanding via  teach back method and acceptance of all instructions.  Education Materials:  "Living Better With Heart Failure" Booklet, HF zone tool, & Daily Weight Tracker Tool.  Patient has scale at home: no, will give from AHF clinic Patient has pill box at home: no, given from AHF clinic.   Barriers of Care:   -financial strain -medication compliance -fluid/diet restrictions -transportation  Considerations/Referrals:   Referral made to Heart Failure Pharmacist Stewardship: no Referral made to Heart Failure CSW/NCM TOC: yes, appreciated Referral made to Heart & Vascular TOC clinic: yes, 11/29 @ 10AM  Items for Follow-up on DC/TOC: -optimize -medication compliance -diet/fluid compliance -f/u transportation needs   Ozella Rocks, MSN, RN Heart Failure Nurse Navigator [phone]

## 2021-05-05 NOTE — Hospital Course (Addendum)
Acute hypoxic respiratory failure Acute HFpEF exacerbation Contraction alkalosis Influenza A Chest x-ray and BNP are consistent with volume overload picture.  Her weights on 11/8 with 380 lbs and up to 407 lbs in the ED.  The etiology of heart failure exacerbation likely secondary to poor adherence to torsemide regimen, as well as influenza A, and untreated OSA/OHS. Held off on initiating tamiflu therapy, as the patient was >48 hrs out since symptom onset and is clinically improving. Echo this admission with EF of 55-60% and normal biventricular function, no evidence of valvular heart disease (no significant changes as compared to prior echo). Volume status is difficult to assess given the patient's body habitus, however, she had good urine output during admission with IV diuresis. Bicarb increased to 38 on hospital day 2, suspect this is likely secondary to contraction alkalosis from IV diuresis. Creatinine remained within normal limits and the patient feels like she still has some fluid to get off of her, thus will again dose the patient with IV diuretics, as well as acetazolamide 5000 mg. On day of discharge, patient was down about 12 lbs since admission. Weight on admission was recorded at 407 lbs, however, suspect this was inaccurate, as weight on the floor was 379 lbs and she was weighing at 367 lbs on discharge day. Discussed importance of adherence to torsemide, and patient expressed understanding.   Obesity hypoventilation syndrome Obstructive sleep apnea PFTs most recently consistent with restrictive lung disease 2/2 obesity hypoventilation syndrome with a positive sleep study. Patient has a BiPAP at home, however, it has not been working the past 2 months. Social work will coordinate new BiPAP machine vs helping patient to get hers fixed.   Type 2 diabetes Last A1c of 8.8 in July. The patient takes Trulicity and Victoza, as she thought that Trulicity was insulin. Will plan to discontinue  Victoza, and continue with once weekly Trulicity injection. Restarted levemir while the patient was hospitalized, and will discharge with long acting inuslin, as well as metformin. Recommend close follow up with PCP for diabetes management.   Hypertension  Blood pressure elevated, with systolic running in the 130-140s throughout admission. Started on amlodipine 10 mg this admission. Could benefit from ARB upon discharge and recommend BP follow up with PCP for further titration of meds.   Chest pain Atypical presentation on admission.  ACS rule out with downtrending troponin. EKG showed left axis deviation with incomplete RBBB which are not new findings.  Suspect muscle strain in the setting of frequent coughing. Her dyspnea can cause her chest pain as well. Chest pain improved throughout admission.

## 2021-05-06 DIAGNOSIS — I5033 Acute on chronic diastolic (congestive) heart failure: Secondary | ICD-10-CM | POA: Diagnosis not present

## 2021-05-06 DIAGNOSIS — G4733 Obstructive sleep apnea (adult) (pediatric): Secondary | ICD-10-CM | POA: Diagnosis not present

## 2021-05-06 LAB — BASIC METABOLIC PANEL
Anion gap: 7 (ref 5–15)
BUN: 14 mg/dL (ref 6–20)
CO2: 38 mmol/L — ABNORMAL HIGH (ref 22–32)
Calcium: 8.7 mg/dL — ABNORMAL LOW (ref 8.9–10.3)
Chloride: 92 mmol/L — ABNORMAL LOW (ref 98–111)
Creatinine, Ser: 0.79 mg/dL (ref 0.44–1.00)
GFR, Estimated: >60 mL/min (ref >60)
Glucose, Bld: 205 mg/dL — ABNORMAL HIGH (ref 70–99)
Potassium: 4.2 mmol/L (ref 3.5–5.1)
Sodium: 137 mmol/L (ref 135–145)

## 2021-05-06 LAB — GLUCOSE, CAPILLARY
Glucose-Capillary: 207 mg/dL — ABNORMAL HIGH (ref 70–99)
Glucose-Capillary: 215 mg/dL — ABNORMAL HIGH (ref 70–99)
Glucose-Capillary: 218 mg/dL — ABNORMAL HIGH (ref 70–99)
Glucose-Capillary: 279 mg/dL — ABNORMAL HIGH (ref 70–99)

## 2021-05-06 LAB — CBC
HCT: 37 % (ref 36.0–46.0)
Hemoglobin: 11 g/dL — ABNORMAL LOW (ref 12.0–15.0)
MCH: 27.2 pg (ref 26.0–34.0)
MCHC: 29.7 g/dL — ABNORMAL LOW (ref 30.0–36.0)
MCV: 91.4 fL (ref 80.0–100.0)
Platelets: 271 10*3/uL (ref 150–400)
RBC: 4.05 MIL/uL (ref 3.87–5.11)
RDW: 13.7 % (ref 11.5–15.5)
WBC: 8.9 10*3/uL (ref 4.0–10.5)
nRBC: 0 % (ref 0.0–0.2)

## 2021-05-06 MED ORDER — ACETAZOLAMIDE 250 MG PO TABS
250.0000 mg | ORAL_TABLET | Freq: Once | ORAL | Status: AC
  Administered 2021-05-06: 250 mg via ORAL
  Filled 2021-05-06: qty 1

## 2021-05-06 MED ORDER — INSULIN ASPART 100 UNIT/ML IJ SOLN: 0.0000 [IU] | Freq: Three times a day (TID) | INTRAMUSCULAR | Status: DC

## 2021-05-06 MED ORDER — INSULIN ASPART 100 UNIT/ML IJ SOLN
8.0000 [IU] | Freq: Three times a day (TID) | INTRAMUSCULAR | Status: DC
Start: 2021-05-06 — End: 2021-05-07
  Administered 2021-05-06 – 2021-05-07 (×5): 8 [IU] via SUBCUTANEOUS

## 2021-05-06 MED ORDER — INSULIN ASPART 100 UNIT/ML IJ SOLN
0.0000 [IU] | Freq: Every day | INTRAMUSCULAR | Status: DC
  Administered 2021-05-06 (×2): 2 [IU] via SUBCUTANEOUS

## 2021-05-06 MED ORDER — INSULIN DETEMIR 100 UNIT/ML ~~LOC~~ SOLN
45.0000 [IU] | Freq: Every day | SUBCUTANEOUS | Status: DC
  Administered 2021-05-06: 45 [IU] via SUBCUTANEOUS
  Filled 2021-05-06 (×2): qty 0.45

## 2021-05-06 MED ORDER — ENOXAPARIN SODIUM 80 MG/0.8ML IJ SOSY
80.0000 mg | PREFILLED_SYRINGE | INTRAMUSCULAR | Status: DC
  Administered 2021-05-06: 80 mg via SUBCUTANEOUS
  Filled 2021-05-06: qty 0.8

## 2021-05-06 NOTE — Progress Notes (Signed)
Heart Failure Nurse Navigator Progress Note  Gave pt planned HV TOC scheduled follow up, 11/29 @ 10AM.  HF TOC to speak with pt regarding transportation issues and medicaid questions.   Ozella Rocks, MSN, RN Heart Failure Nurse Navigator [phone]

## 2021-05-06 NOTE — Discharge Summary (Signed)
Name: Yvette Gibbs MRN: [id number] DOB: [birth year] 30 y.o. PCP: Kerin Perna, NP  Date of Admission: 05/04/2021  1:55 AM Date of Discharge:  05/07/2021 Attending Physician: Dr. Philipp Ovens  DISCHARGE DIAGNOSIS:  Primary Problem: Acute exacerbation of CHF (congestive heart failure) [facility])   Hospital Problems: Principal Problem:   Acute exacerbation of CHF (congestive heart failure) (HCC) Active Problems:   Morbid obesity (HCC)   OSA (obstructive sleep apnea)   Influenza   Obesity hypoventilation syndrome (HCC)   Heart failure with preserved ejection fraction (HCC)   Hypoxia    DISCHARGE MEDICATIONS:   Allergies as of 05/07/2021       Reactions   Bee Pollen Other (See Comments)   Sneezing and eye burning   Fish Allergy Itching, Rash   Lisinopril Rash   Caused a rash and caused her to sweat a lot.        Medication List     STOP taking these medications    liraglutide 18 MG/3ML Sopn Commonly known as: VICTOZA       TAKE these medications    Accu-Chek Softclix Lancets lancets Use as instructed   acetaminophen 500 MG tablet Commonly known as: TYLENOL Take 2 tablets (1,000 mg total) by mouth every 6 (six) hours as needed for fever, mild pain, moderate pain or headache.   albuterol 108 (90 Base) MCG/ACT inhaler Commonly known as: VENTOLIN HFA Inhale 1-2 puffs into the lungs every 6 (six) hours as needed for wheezing or shortness of breath.   amLODipine 10 MG tablet Commonly known as: NORVASC Take 1 tablet (10 mg total) by mouth daily. Start taking on: May 08, 2021   benzonatate 100 MG capsule Commonly known as: TESSALON Take 1 capsule (100 mg total) by mouth every 8 (eight) hours.   famotidine 20 MG tablet Commonly known as: PEPCID TAKE 1 TABLET BY MOUTH TWICE A DAY   insulin glargine 100 unit/mL Sopn Commonly known as: LANTUS Inject 45 Units into the skin daily.   Insulin Pen Needle 32G X 4 MM Misc Use as directed What changed:  medication strength   metFORMIN 1000 MG tablet Commonly known as: GLUCOPHAGE Take 1 tablet (1,000 mg total) by mouth 2 (two) times daily with a meal.   ondansetron 4 MG disintegrating tablet Commonly known as: Zofran ODT Take 1 tablet (4 mg total) by mouth every 8 (eight) hours as needed for nausea or vomiting.   pantoprazole 40 MG tablet Commonly known as: PROTONIX TAKE 1 TABLET BY MOUTH EVERY DAY   torsemide 20 MG tablet Commonly known as: DEMADEX Take 2 tablets (40 mg total) by mouth 2 (two) times daily. What changed:  medication strength Another medication with the same name was removed. Continue taking this medication, and follow the directions you see here.   Trulicity [id number] Sopn Generic drug: Dulaglutide Inject 0.75 mg into the skin once a week.               Durable Medical Equipment  (From admission, onward)           Start     Ordered   05/07/21 1234  For home use only DME Bipap  Once       Question Answer Comment  Length of Need Lifetime   Keep 02 saturation >90   Inspiratory pressure 10   Expiratory pressure 5      05/07/21 1235            DISPOSITION AND FOLLOW-UP:  Yvette Gibbs  was discharged from [facility] in Stable condition. At the hospital follow up visit please address:  Follow-up Recommendations: Consults: Pulmonary medicine Labs: Blood Sugar and Hgb A1C Studies: None Medications: Lantus [id number] qhs, Trulicity once weekly, metformin 1000 mg bid Amlodipine 10 mg (new) STOP Victoza (duplicate therapy, has trulicity)  Follow-up Appointments:  06/19/20 at 2:30 pm with PCP, Dr. Wynetta Emery.  Follow-up Information     Ladell Pier, MD. Go in 44 day(s).   Specialty: Internal Medicine Why: Primary Care appointment Thursday 06/19/2020 at 2:30pm with Dr. Wynetta Emery soonest available appointment Contact information: [place] [address] [phone]                 HOSPITAL  COURSE:  Patient Summary: Acute hypoxic respiratory failure Acute HFpEF exacerbation Contraction alkalosis Influenza A Chest x-ray and BNP are consistent with volume overload picture.  Her weights on 11/8 with 380 lbs and up to 407 lbs in the ED.  The etiology of heart failure exacerbation likely secondary to poor adherence to torsemide regimen, as well as influenza A, and untreated OSA/OHS. Held off on initiating tamiflu therapy, as the patient was >48 hrs out since symptom onset and is clinically improving. Echo this admission with EF of 55-60% and normal biventricular function, no evidence of valvular heart disease (no significant changes as compared to prior echo). Volume status is difficult to assess given the patient's body habitus, however, she had good urine output during admission with IV diuresis. Bicarb increased to 38 on hospital day 2, suspect this is likely secondary to contraction alkalosis from IV diuresis. Creatinine remained within normal limits and the patient feels like she still has some fluid to get off of her, thus will again dose the patient with IV diuretics, as well as acetazolamide 5000 mg. On day of discharge, patient was down about 12 lbs since admission. Weight on admission was recorded at 407 lbs, however, suspect this was inaccurate, as weight on the floor was 379 lbs and she was weighing at 367 lbs on discharge day. Discussed importance of adherence to torsemide, and patient expressed understanding.   Obesity hypoventilation syndrome Obstructive sleep apnea PFTs most recently consistent with restrictive lung disease 2/2 obesity hypoventilation syndrome with a positive sleep study. Patient has a BiPAP at home, however, it has not been working the past 2 months. Social work will coordinate new BiPAP machine vs helping patient to get hers fixed.   Type 2 diabetes Last A1c of 8.8 in July. The patient takes Trulicity and Victoza, as she thought that Trulicity was insulin.  Will plan to discontinue Victoza, and continue with once weekly Trulicity injection. Restarted levemir while the patient was hospitalized, and will discharge with long acting inuslin, as well as metformin. Recommend close follow up with PCP for diabetes management.   Hypertension  Blood pressure elevated, with systolic running in the [id number] throughout admission. Started on amlodipine 10 mg this admission. Could benefit from ARB upon discharge and recommend BP follow up with PCP for further titration of meds.   Chest pain Atypical presentation on admission.  ACS rule out with downtrending troponin. EKG showed left axis deviation with incomplete RBBB which are not new findings.  Suspect muscle strain in the setting of frequent coughing. Her dyspnea can cause her chest pain as well. Chest pain improved throughout admission.    DISCHARGE INSTRUCTIONS:  Dear Ms. Vosler,  You were hospitalized for a heart failure exacerbation that was likely caused by the  flu. We gave you IV diuretics during your hospitalization and your weight dropped 12 lbs. Please take your torsemide 40 mg twice a day to help keep the fluid off of your body.   Additionally, for your diabetes, please continue to use the Trulicity once a week. STOP taking Victoza, as this medicine is very similar to Trulicity and you do not need both. Please start using insulin (lantus) 45 units every night to help keep your blood sugars under better control, as well as take metformin 1000 mg twice a day. You should follow up with your primary care physician regarding these medications, because you may need to adjust the doses.   Please also continue to wear your BiPAP machine each night. The Social Worker will help you to coordinate getting your machine fixed vs getting a new one sent to your home.  If you have any questions or concerns, call our clinic at [phone] or after hours call [phone] and ask for the internal medicine resident on  call.  SUBJECTIVE:  Yvette Gibbs was seen and evaluated on the day of discharge. She is feeling much improved, and has no further SOB and no chest tightness. She has no acute complaints today.   Discharge Vitals:   BP (!) 136/93 (BP Location: Left Arm)   Pulse 94   Temp 97.7 F (36.5 C) (Oral)   Resp 18   Ht 5\' 2"  (1.575 m)   Wt (!) 166.6 kg   LMP 05/01/2021   SpO2 100%   BMI 67.16 kg/m   OBJECTIVE:  General: No acute distress. Head: Normocephalic. Atraumatic. CV: RRR. No murmurs, rubs, or gallops. Minimal nonpitting LE edema Pulmonary: Diminished breath sounds bilaterally 2/2 body habitus Abdominal: Soft, nontender, nondistended. Obese abdomen Extremities: Palpable radial and DP pulses. Normal ROM. Skin: Warm and dry. No obvious rash or lesions. Neuro: A&Ox3. No focal deficit. Psych: Normal mood and affect    Pertinent Labs, Studies, and Procedures:  CBC Latest Ref Rng & Units 05/07/2021 05/06/2021 05/05/2021  WBC 4.0 - 10.5 K/uL 6.7 8.9 6.6  Hemoglobin 12.0 - 15.0 g/dL 12.0 11.0(L) 10.8(L)  Hematocrit 36.0 - 46.0 % 40.9 37.0 37.8  Platelets 150 - 400 K/uL 341 271 285    CMP Latest Ref Rng & Units 05/07/2021 05/06/2021 05/05/2021  Glucose 70 - 99 mg/dL 150(H) 205(H) 304(H)  BUN 6 - 20 mg/dL 11 14 13   Creatinine 0.44 - 1.00 mg/dL 0.75 0.79 0.90  Sodium 135 - 145 mmol/L 137 137 135  Potassium 3.5 - 5.1 mmol/L 3.4(L) 4.2 5.1  Chloride 98 - 111 mmol/L 93(L) 92(L) 91(L)  CO2 22 - 32 mmol/L 33(H) 38(H) 33(H)  Calcium 8.9 - 10.3 mg/dL 9.1 8.7(L) 8.5(L)  Total Protein 6.5 - 8.1 g/dL - - -  Total Bilirubin 0.3 - 1.2 mg/dL - - -  Alkaline Phos 38 - 126 U/L - - -  AST 15 - 41 U/L - - -  ALT 0 - 44 U/L - - -    DG Chest 2 View  Result Date: 05/04/2021 CLINICAL DATA:  Shortness of breath EXAM: CHEST - 2 VIEW COMPARISON:  04/30/2021 FINDINGS: Check shadow is mildly enlarged but stable. Lungs are well aerated bilaterally. Increasing vascular congestion is noted with  patchy airspace opacities likely representing edema. No confluent infiltrate is seen. No bony abnormality is noted. IMPRESSION: Changes consistent with fluid overload with vascular congestion and edema. Electronically Signed   By: Inez Catalina M.D.   On: 05/04/2021 02:52  ECHOCARDIOGRAM COMPLETE  Result Date: 05/05/2021    ECHOCARDIOGRAM REPORT   Patient Name:   Yvette Gibbs Date of Exam: 05/05/2021 Medical Rec #:  [id number]        Height:       62.0 in Accession #:    [id number]       Weight:       407.8 lb Date of Birth:  Oct 23, 1990        BSA:          2.588 m Patient Age:    30 years         BP:           144/97 mmHg Patient Gender: F                HR:           86 bpm. Exam Location:  Inpatient Procedure: 2D Echo, Color Doppler, Cardiac Doppler and Intracardiac            Opacification Agent Indications:    Respiratory distress  History:        Patient has prior history of Echocardiogram examinations. Risk                 Factors:Hypertension, Diabetes and Sleep Apnea.  Sonographer:    Jyl Heinz Referring Phys: [id number] Velna Ochs  Sonographer Comments: Technically difficult study due to poor echo windows. IMPRESSIONS  1. Left ventricular ejection fraction, by estimation, is 60 to 65%. Left ventricular ejection fraction by 2D MOD biplane is 62.8 %. The left ventricle has normal function. The left ventricle has no regional wall motion abnormalities. Left ventricular diastolic parameters were normal.  2. Right ventricular systolic function is normal. The right ventricular size is normal.  3. The mitral valve is normal in structure. No evidence of mitral valve regurgitation. No evidence of mitral stenosis.  4. The aortic valve is normal in structure. Aortic valve regurgitation is not visualized. No aortic stenosis is present.  5. The inferior vena cava is normal in size with greater than 50% respiratory variability, suggesting right atrial pressure of 3 mmHg. Comparison(s): Changes from prior  study are noted. 09/28/2020: LVEF 55-60%. Conclusion(s)/Recommendation(s): Normal biventricular function without evidence of hemodynamically significant valvular heart disease. FINDINGS  Left Ventricle: Left ventricular ejection fraction, by estimation, is 60 to 65%. Left ventricular ejection fraction by 2D MOD biplane is 62.8 %. The left ventricle has normal function. The left ventricle has no regional wall motion abnormalities. Definity contrast agent was given IV to delineate the left ventricular endocardial borders. The left ventricular internal cavity size was normal in size. There is no left ventricular hypertrophy. Left ventricular diastolic parameters were normal. Right Ventricle: The right ventricular size is normal. No increase in right ventricular wall thickness. Right ventricular systolic function is normal. Left Atrium: Left atrial size was normal in size. Right Atrium: Right atrial size was normal in size. Pericardium: There is no evidence of pericardial effusion. Mitral Valve: The mitral valve is normal in structure. No evidence of mitral valve regurgitation. No evidence of mitral valve stenosis. Tricuspid Valve: The tricuspid valve is normal in structure. Tricuspid valve regurgitation is trivial. No evidence of tricuspid stenosis. Aortic Valve: The aortic valve is normal in structure. Aortic valve regurgitation is not visualized. No aortic stenosis is present. Aortic valve peak gradient measures 7.2 mmHg. Pulmonic Valve: The pulmonic valve was normal in structure. Pulmonic valve regurgitation is not visualized. No evidence of pulmonic stenosis. Aorta: The aortic root is  normal in size and structure. Venous: The inferior vena cava is normal in size with greater than 50% respiratory variability, suggesting right atrial pressure of 3 mmHg. IAS/Shunts: No atrial level shunt detected by color flow Doppler.  LEFT VENTRICLE PLAX 2D                        Biplane EF (MOD) LVIDd:         4.80 cm         LV  Biplane EF:   Left LVIDs:         3.10 cm                          ventricular LV PW:         1.30 cm                          ejection LV IVS:        1.00 cm                          fraction by LVOT diam:     2.10 cm                          2D MOD LV SV:         49                               biplane is LV SV Index:   19                               62.8 %. LVOT Area:     3.46 cm                                Diastology                                LV e' medial:    10.00 cm/s LV Volumes (MOD)               LV E/e' medial:  8.8 LV vol d, MOD    140.0 ml      LV e' lateral:   12.50 cm/s A2C:                           LV E/e' lateral: 7.0 LV vol d, MOD    166.0 ml A4C: LV vol s, MOD    52.6 ml A2C: LV vol s, MOD    58.6 ml A4C: LV SV MOD A2C:   87.4 ml LV SV MOD A4C:   166.0 ml LV SV MOD BP:    96.7 ml IVC IVC diam: 1.40 cm LEFT ATRIUM           Index LA diam:      3.60 cm 1.39 cm/m LA Vol (A2C): 47.8 ml 18.47 ml/m LA Vol (A4C): 58.0 ml 22.41 ml/m  AORTIC VALVE AV Area (Vmax): 2.08 cm AV Vmax:        134.00 cm/s AV Peak Grad:   7.2 mmHg  LVOT Vmax:      80.50 cm/s LVOT Vmean:     60.500 cm/s LVOT VTI:       0.142 m  AORTA Ao Root diam: 2.60 cm Ao Asc diam:  2.90 cm MITRAL VALVE MV Area (PHT): 3.95 cm    SHUNTS MV Decel Time: 192 msec    Systemic VTI:  0.14 m MV E velocity: 87.80 cm/s  Systemic Diam: 2.10 cm MV A velocity: 59.20 cm/s MV E/A ratio:  1.48 Lyman Bishop MD Electronically signed by Lyman Bishop MD Signature Date/Time: 05/05/2021/11:27:13 AM    Final      Signed: Buddy Duty, D.O.  Internal Medicine Resident, PGY-1 [facility]  Pager: [phone] 12:39 PM, 05/07/2021

## 2021-05-06 NOTE — Progress Notes (Addendum)
Heart Failure Navigation Team Progress Note  PCP: Grayce Sessions, NP Primary Cardiologist: N/A Admitted from: hotel  Past Medical History:  Diagnosis Date   Acute exacerbation of CHF (congestive heart failure) (HCC) 05/04/2021   Asthma    Depression    Diabetes mellitus type 2, uncontrolled    Extreme obesity    Family history of adverse reaction to anesthesia    " my grandmother had an allergic reaction   Hypertension    Mental disorder    Obesity    OSA (obstructive sleep apnea)     Social History   Socioeconomic History   Marital status: Significant Other    Spouse name: Not on file   Number of children: 0   Years of education: Not on file   Highest education level: 11th grade  Occupational History   Occupation: Estate agent at [employer]  Tobacco Use   Smoking status: Former    Packs/day: 0.50    Years: 10.00    Pack years: 5.00    Types: Cigarettes    Quit date: 06/15/2018    Years since quitting: 2.8   Smokeless tobacco: Never  Vaping Use   Vaping Use: Former   Quit date: 06/02/2020   Substances: Nicotine, Flavoring  Substance and Sexual Activity   Alcohol use: Not Currently   Drug use: Not Currently    Types: Marijuana    Comment: 10/2020 last use   Sexual activity: Yes    Partners: Male    Birth control/protection: Condom  Other Topics Concern   Not on file  Social History Narrative   Not on file   Social Determinants of Health   Financial Resource Strain: Low Risk    Difficulty of Paying Living Expenses: Not very hard  Food Insecurity: Food Insecurity Present   Worried About Programme researcher, broadcasting/film/video in the Last Year: Sometimes true   Ran Out of Food in the Last Year: Sometimes true  Transportation Needs: Unmet Transportation Needs   Lack of Transportation (Medical): Yes   Lack of Transportation (Non-Medical): Yes  Physical Activity: Not on file  Stress: Not on file  Social Connections: Not on file     Heart & Vascular Transition of  Care Clinic follow-up: Scheduled for 05/13/21 at 10am.  Confirmed transportation resources provided.  Immediate social needs: transportation  CSW spoke with Ms. Almendarez at bedside about transportation and provided Ms. Wrede with Medicaid Transportation information and other transportation resources. Ms. Musolf also reported she would like a new PCP appointment with another provider. Ms. Stakes reported that she does get food stamps and receives disability. CSW was able to schedule a PCP appointment at [facility] soonest available Thursday, 06/19/20 at 2:30pm with Dr. Laural Benes, per patients request for a new provider information can be found on the patients AVS.  05/07/21: New PCP appointment was cancelled, CSW had to reschedule PCP appointment with [facility] for 07/04/20 at 8:50am with Dr. Laural Benes.  Tausha Milhoan, MSW, LCSWA [phone] Heart Failure Social Worker

## 2021-05-06 NOTE — Progress Notes (Signed)
HD#2 SUBJECTIVE:  Patient Summary: Yvette Gibbs is a 30 y.o. with a pertinent PMH of HTN, T2DM, OSA, HFpEF, and morbid obesity who presented with shortness of breath and admitted for acute on chronic HFpEF.   Overnight Events: No acute events overnight reported.  Interim History: This is hospital day 2 for Yvette Gibbs who was seen and evaluated at the bedside this morning. Patient denies any further episodes of SOB and denies DOE. Chest pain much improved today, as well. She does endorse feeling dizzy, which she attributes to blood loss from her menstrual cycle. Overall feels improved, but she states that she is not quite back to her baseline.    OBJECTIVE:  Vital Signs: Vitals:   05/05/21 1639 05/05/21 2018 05/05/21 2306 05/06/21 0400  BP: (!) 152/90 (!) 143/90  (!) 136/91  Pulse: 92 89  87  Resp: 19 20  20   Temp:  98.1 F (36.7 C)  97.7 F (36.5 C)  TempSrc:  Oral  Oral  SpO2: 98% 99% 98% 92%  Weight:    (!) 169.7 kg  Height:       Supplemental O2: Room air SpO2: 92 %   Filed Weights   05/04/21 0205 05/05/21 1058 05/06/21 0400  Weight: (!) 185 kg (!) 172.3 kg (!) 169.7 kg     Intake/Output Summary (Last 24 hours) at 05/06/2021 0535 Last data filed at 05/06/2021 0407 Gross per 24 hour  Intake 1680 ml  Output 1751 ml  Net -71 ml   Net IO Since Admission: -214.52 mL [05/06/21 0535]  Physical Exam: General: No acute distress. Head: Normocephalic. Atraumatic. CV: RRR. No murmurs/rubs/gallops. Nonpitting LE edema bilaterally Pulmonary: Diminished breath sounds bilaterally. Abdominal: Soft, nontender. Normal bowel sounds. Obese abdomen Extremities: Palpable radial and DP pulses. Normal ROM. Skin: Warm and dry. No obvious rash or lesions. Neuro: A&Ox3. No focal deficit. Psych: Normal mood and affect     ASSESSMENT/PLAN:  Assessment: Principal Problem:   Acute exacerbation of CHF (congestive heart failure) (HCC) Active Problems:   OSA (obstructive  sleep apnea)   Influenza   Heart failure with preserved ejection fraction (HCC)   Hypoxia   Plan: #Acute hypoxic respiratory failure 2/2 acute on chronic HFpEF #Influenza A #Contraction alkalosis Echo this admission with EF of 55-60% and normal biventricular function, no evidence of valvular heart disease. Suspect that the etiology of this patient's heart failure exacerbation is driven by multiple factors, including non adherence with torsemide regimen, influenza A infection, and untreated OSA and OHS. Volume status is difficult to assess given the patient's body habitus, however, she had 1.7 L UOP in the last 24 hrs and suspect she had more urine output, as patient urinated and did not measure it this morning. Weight on admission was recorded at 407 lbs, and down to 374 lbs today. Additionally, bicarb this morning increased from 32 on admission to 38 today. Suspect this is likely secondary to contraction alkalosis from IV diuresis. Creatinine remains within normal limits and the patient feels like she still has some fluid to get off of her, thus will again dose the patient with IV diuretics, as well as acetazolamide, plan to transition to oral torsemide (home regimen) tomorrow.  - Continue IV lasix 80 mg bid - Acetazolamide 250 mg once  - Strict I&Os and daily weights - Recheck weight today - Orthostatic vital signs + ambulatory pulse ox  #Obstructive sleep apnea #Obesity hypoventilation syndrome PFTs most recently consistent with restrictive lung disease 2/2 obesity hypoventilation syndrome with  a positive sleep study. Patient has a BiPAP at home, however, it has not been working the past 2 months. Will work with social work to get new BiPAP machine vs fixing the patient's current one.  - BiPAP qhs   #Type 2 diabetes A1c 8.8. The patient takes Trulicity and Victoza, as she thought that Trulicity was insulin. Will plan to discontinue Trulicity, as patient prefers once daily injection, thus,  will continue with Victoza. Restarted levemir while the patient was hospitalized, and will discharge with long acting inuslin, as well. CBGs remain elevated and increased insulin regimen, as below.  - Levemir 45u qhs + Novolog 8u tid with meals + SSI   Best Practice: Diet: Cardiac diet IVF: Fluids: none VTE: Lovenox Code: Full AB: None Therapy Recs: None DISPO: Anticipated discharge tomorrow to Home pending Medical stability.  Signature: Elza Rafter, D.O.  Internal Medicine Resident, PGY-1 [facility]  Pager: [phone] 5:35 AM, 05/06/2021   Please contact the on call pager after 5 pm and on weekends at [phone].

## 2021-05-06 NOTE — Plan of Care (Signed)
°  Problem: Education: °Goal: Ability to demonstrate management of disease process will improve °Outcome: Progressing °  °Problem: Education: °Goal: Ability to verbalize understanding of medication therapies will improve °Outcome: Progressing °  °Problem: Activity: °Goal: Capacity to carry out activities will improve °Outcome: Progressing °  °

## 2021-05-06 NOTE — Discharge Instructions (Addendum)
Dear Ms. Coglianese,  You were hospitalized for a heart failure exacerbation that was likely caused by the flu. We gave you IV diuretics during your hospitalization and your weight dropped 12 lbs. Please take your torsemide 40 mg twice a day to help keep the fluid off of your body.   Additionally, for your diabetes, please continue to use the Trulicity once a week. STOP taking Victoza, as this medicine is very similar to Trulicity and you do not need both. Please start using insulin (lantus) 45 units every night to help keep your blood sugars under better control, as well as take metformin 1000 mg twice a day. You should follow up with your primary care physician regarding these medications, because you may need to adjust the doses.   Please also continue to wear your BiPAP machine each night. The Social Worker will help you to coordinate getting your machine fixed vs getting a new one sent to your home.  If you have any questions or concerns, call our clinic at [phone] or after hours call [phone] and ask for the internal medicine resident on call.   Best recommendations for nutritional shakes include either Premier Protein shakes and/or Ensure Max, both of which are lower in calories but have 30 grams of protein each. Both sold at conventional stores and have a variety of flavors. Aundra Millet, RD

## 2021-05-06 NOTE — Progress Notes (Signed)
Brief Nutrition Note  RD working remotely.  RD received consult for patient asking about shakes in the morning to have instead of eating a big breakfast, as she is not too hungry in the mornings.  RD attempted to call patient's room twice. Pt did not answer.  Best recommendations include either Premier Protein shakes and/or Ensure Max, both of which are lower in calories (~150 kcal/bottle) but have 30 grams of protein each. Both sold at conventional stores and have a variety of flavors.  RD to leave this information in patient's discharge summary.  No nutrition interventions warranted at this time. If additional nutritional needs arise, please re-consult RD.  Vertell Limber, RD, LDN (she/her/hers) Clinical Inpatient Dietitian RD Pager/After-Hours/Weekend Pager # in [place]

## 2021-05-06 NOTE — Progress Notes (Addendum)
Pt on bipap 14/6 when RT entered room. Pt VS stable Spo2 95 and Hr 85.

## 2021-05-07 ENCOUNTER — Other Ambulatory Visit (HOSPITAL_COMMUNITY): Payer: Self-pay

## 2021-05-07 DIAGNOSIS — E119 Type 2 diabetes mellitus without complications: Secondary | ICD-10-CM

## 2021-05-07 DIAGNOSIS — E662 Morbid (severe) obesity with alveolar hypoventilation: Secondary | ICD-10-CM | POA: Diagnosis not present

## 2021-05-07 DIAGNOSIS — J111 Influenza due to unidentified influenza virus with other respiratory manifestations: Secondary | ICD-10-CM | POA: Diagnosis not present

## 2021-05-07 DIAGNOSIS — I5033 Acute on chronic diastolic (congestive) heart failure: Secondary | ICD-10-CM | POA: Diagnosis not present

## 2021-05-07 DIAGNOSIS — R0902 Hypoxemia: Secondary | ICD-10-CM | POA: Diagnosis not present

## 2021-05-07 DIAGNOSIS — G4733 Obstructive sleep apnea (adult) (pediatric): Secondary | ICD-10-CM | POA: Diagnosis not present

## 2021-05-07 LAB — GLUCOSE, CAPILLARY
Glucose-Capillary: 173 mg/dL — ABNORMAL HIGH (ref 70–99)
Glucose-Capillary: 156 mg/dL — ABNORMAL HIGH (ref 70–99)
Glucose-Capillary: 265 mg/dL — ABNORMAL HIGH (ref 70–99)
Glucose-Capillary: 338 mg/dL — ABNORMAL HIGH (ref 70–99)

## 2021-05-07 LAB — BASIC METABOLIC PANEL
Anion gap: 11 (ref 5–15)
BUN: 11 mg/dL (ref 6–20)
CO2: 33 mmol/L — ABNORMAL HIGH (ref 22–32)
Calcium: 9.1 mg/dL (ref 8.9–10.3)
Chloride: 93 mmol/L — ABNORMAL LOW (ref 98–111)
Creatinine, Ser: 0.75 mg/dL (ref 0.44–1.00)
GFR, Estimated: >60 mL/min (ref >60)
Glucose, Bld: 150 mg/dL — ABNORMAL HIGH (ref 70–99)
Potassium: 3.4 mmol/L — ABNORMAL LOW (ref 3.5–5.1)
Sodium: 137 mmol/L (ref 135–145)

## 2021-05-07 LAB — CBC
HCT: 40.9 % (ref 36.0–46.0)
Hemoglobin: 12 g/dL (ref 12.0–15.0)
MCH: 26.2 pg (ref 26.0–34.0)
MCHC: 29.3 g/dL — ABNORMAL LOW (ref 30.0–36.0)
MCV: 89.3 fL (ref 80.0–100.0)
Platelets: 341 10*3/uL (ref 150–400)
RBC: 4.58 MIL/uL (ref 3.87–5.11)
RDW: 13.6 % (ref 11.5–15.5)
WBC: 6.7 10*3/uL (ref 4.0–10.5)
nRBC: 0 % (ref 0.0–0.2)

## 2021-05-07 MED ORDER — ORAL CARE MOUTH RINSE: 15.0000 mL | Freq: Two times a day (BID) | OROMUCOSAL | Status: DC

## 2021-05-07 MED ORDER — INSULIN PEN NEEDLE 32G X 4 MM MISC
0 refills | Status: DC
  Filled 2021-05-07: qty 100, 30d supply, fill #0

## 2021-05-07 MED ORDER — TORSEMIDE 20 MG PO TABS
40.0000 mg | ORAL_TABLET | Freq: Two times a day (BID) | ORAL | 0 refills | Status: DC
  Filled 2021-05-07: qty 120, 30d supply, fill #0

## 2021-05-07 MED ORDER — ACCU-CHEK SOFTCLIX LANCETS MISC
0 refills | Status: DC
  Filled 2021-05-07: qty 100, 25d supply, fill #0

## 2021-05-07 MED ORDER — INSULIN GLARGINE 100 UNITS/ML SOLOSTAR PEN: 45.0000 [IU] | PEN_INJECTOR | Freq: Every day | SUBCUTANEOUS | 0 refills | Status: DC

## 2021-05-07 MED ORDER — INSULIN GLARGINE 100 UNIT/ML SOLOSTAR PEN
45.0000 [IU] | PEN_INJECTOR | Freq: Every day | SUBCUTANEOUS | 0 refills | Status: DC
  Filled 2021-05-07: qty 15, 33d supply, fill #0

## 2021-05-07 MED ORDER — AMLODIPINE BESYLATE 10 MG PO TABS
10.0000 mg | ORAL_TABLET | Freq: Every day | ORAL | 0 refills | Status: DC
  Filled 2021-05-07: qty 30, 30d supply, fill #0

## 2021-05-07 MED ORDER — LIP MEDEX EX OINT
TOPICAL_OINTMENT | CUTANEOUS | Status: DC | PRN
  Filled 2021-05-07: qty 7

## 2021-05-07 MED ORDER — METFORMIN HCL 1000 MG PO TABS
1000.0000 mg | ORAL_TABLET | Freq: Two times a day (BID) | ORAL | 0 refills | Status: DC
Start: 2021-05-07 — End: 2021-05-21
  Filled 2021-05-07: qty 60, 30d supply, fill #0

## 2021-05-07 MED ORDER — TRULICITY 0.75 MG/0.5ML ~~LOC~~ SOAJ
0.7500 mg | SUBCUTANEOUS | 0 refills | Status: DC
Start: 2021-05-07 — End: 2021-05-21
  Filled 2021-05-07: qty 2, 28d supply, fill #0

## 2021-05-07 NOTE — Progress Notes (Signed)
Mobility Specialist Progress Note:   05/07/21 1459  Mobility  Activity Ambulated in room  Level of Assistance Modified independent, requires aide device or extra time  Distance Ambulated (ft) 60 ft  Mobility Ambulated with assistance in room  Mobility Response Tolerated well  Mobility performed by Mobility specialist  $Mobility charge 1 Mobility  SATURATION QUALIFICATIONS: (This note is used to comply with regulatory documentation for home oxygen)  Patient Saturations on Room Air at Rest = 96%  Patient Saturations on Room Air while Ambulating = 93%  Patient Saturations on 0 Liters of oxygen while Ambulating = n/a%  Pt received in bed willing to participate in mobility. No complaints of pain. Pt returned to bed with call bell in reach and all needs met.   [facility] Public librarian Phone [phone] Secondary Phone [phone]

## 2021-05-07 NOTE — TOC Initial Note (Signed)
Transition of Care [facility]) - Initial/Assessment Note    Patient Details  Name: Yvette Gibbs MRN: [id number] Date of Birth: [birth year]  Transition of [facility]) CM/SW Contact:    Elliot Cousin, RN Phone Number: [phone] 05/07/2021, 12:56 PM  Clinical Narrative:                 HF TOC CM spoke to pt and states she lives with grandmother. She is having difficulty getting to appts. Provided pt with [employer] number. Contacted [facility] TOC Coordinator, Jane. Her PCP appt was moved to earlier appt, 05/20/2021 at 2:30 pm. Contacted Carma Lair with [facility] about Trilogy not functioning properly. States they will service her Trilogy. Updated attending. Pt meds will come up from [facility] pharmacy.   Expected Discharge Plan: Home/Self Care Barriers to Discharge: No Barriers Identified   Patient Goals and CMS Choice        Expected Discharge Plan and Services Expected Discharge Plan: Home/Self Care In-house Referral: Clinical Social Work Discharge Planning Services: CM Consult Post Acute Care Choice: Durable Medical Equipment (Trilogy) Living arrangements for the past 2 months: Single Family Home Expected Discharge Date: 05/07/21                 DME Agency: [facility] Date DME Agency Contacted: 05/07/21 Time DME Agency Contacted: 1255 Representative spoke with at DME Agency: Carma Lair            Prior Living Arrangements/Services Living arrangements for the past 2 months: Single Family Home Lives with:: Parents (grandmother)   Do you feel safe going back to the place where you live?: Yes      Need for Family Participation in Patient Care: No (Comment) Care giver support system in place?: No (comment) Current home services: DME (Trilogy) Criminal Activity/Legal Involvement Pertinent to Current Situation/Hospitalization: No - Comment as needed  Activities of Daily Living      Permission Sought/Granted Permission sought to share information with :  Case Manager, Family Supports, PCP Permission granted to share information with : Yes, Verbal Permission Granted  Share Information with NAME: Corrie Dandy Strang  Permission granted to share info w AGENCY: DME agency  Permission granted to share info w Relationship: grandmother  Permission granted to share info w Contact Information: [phone]  Emotional Assessment   Attitude/Demeanor/Rapport: Gracious Affect (typically observed): Accepting Orientation: : Oriented to Self, Oriented to Place, Oriented to  Time, Oriented to Situation   Psych Involvement: No (comment)  Admission diagnosis:  Shortness of breath [R06.02] Wheezing [R06.2] Hypoxia [R09.02] Acute exacerbation of CHF (congestive heart failure) (HCC) [I50.9] Influenza [J11.1] Patient Active Problem List   Diagnosis Date Noted   Hypoxia    Acute exacerbation of CHF (congestive heart failure) (HCC) 05/04/2021   Acute asthma exacerbation 12/17/2020   Obstructive lung disease (HCC) 12/17/2020   Acute on chronic respiratory failure with hypoxia and hypercapnia (HCC) 12/17/2020   Heart failure with preserved ejection fraction (HCC) 10/01/2020   Obesity hypoventilation syndrome (HCC) 09/29/2020   Acute respiratory failure with hypoxia (HCC) 09/29/2020   Normocytic anemia 09/29/2020   Mild concentric left ventricular hypertrophy (LVH) 09/29/2020   NAFLD (nonalcoholic fatty liver disease) 96/28/3662   Asthma exacerbation 09/28/2020   Acute respiratory failure due to COVID-19 (HCC) 05/21/2020   Gastroesophageal reflux disease without esophagitis 06/19/2019   Loud snoring 06/19/2019   Type 2 diabetes mellitus without complication, without long-term current use of insulin (HCC) 03/04/2019   Abdominal pain 03/03/2019   HTN (hypertension) 03/03/2019  Hyperglycemia 03/03/2019   Proteinuria 03/03/2019   HCAP (healthcare-associated pneumonia) 09/07/2015   Severe sepsis ([place]) 09/07/2015   Asthma 09/07/2015   Pleuritic chest pain  09/01/2015   Morbid obesity ([place]) 09/01/2015   OSA (obstructive sleep apnea) 09/01/2015   Influenza 0000000   Biliary colic [id number]   PCP:  Kerin Perna, NP Pharmacy:   [id number] - [place], [place] [address] Phone: [phone] Fax: [place] [address] [place] [address] Phone: [phone] Fax: [place] [address] [place] [address] Phone: [phone] Fax: [phone]     Social Determinants of Health (SDOH) Interventions Food Insecurity Interventions: Other (Comment) ($89/mo in food stamps) Financial Strain Interventions: Intervention Not Indicated Housing Interventions: Intervention Not Indicated Intimate Partner Violence Interventions: Intervention Not Indicated Transportation Interventions: Other (Comment) (referred to CSW for medicaid transportation details)  Readmission Risk Interventions Readmission Risk Prevention Plan 12/21/2020  Transportation Screening Complete  Medication Review Press photographer) Complete  HRI or [place] Complete  SW Recovery Care/Counseling Consult Complete  [place] Not Applicable  Some recent data might be hidden

## 2021-05-07 NOTE — Progress Notes (Signed)
Pt d/c with education and HF given stated that she need a new scale and Bipap ti be serviced with ticket placed for [facility]. Pt transported home via [place].

## 2021-05-07 NOTE — Progress Notes (Addendum)
HF CSW spoke with Yvette Gibbs at bedside about the PCP appointments and her request for a new provider. CSW provided Ms. Neddo with a sticky note with both PCP appointments for follow up.  Keiji Melland, MSW, LCSWA [phone] Heart Failure Social Worker

## 2021-05-12 ENCOUNTER — Telehealth: Payer: Self-pay

## 2021-05-12 NOTE — Progress Notes (Incomplete)
HEART & VASCULAR TRANSITION OF CARE CONSULT NOTE     Referring Physician: Primary Care: [name] NP  Primary Cardiologist:  HPI: Referred to clinic by Dr Barbaraann Boys for heart failure consultation.   Ms Treichler is a 30 year old with a history of OSA, HTN, HFpEF, and obesity.   Admitted with increased shortness of breath. Diuresed with IV lasix  and transitioned to Discharge weight 367 pounds.   Overall feeling fine. Denies SOB/PND/Orthopnea. Appetite ok. No fever or chills. Weight at home 170 pounds. Taking all medications  Cardiac Testing  04/2021 Echo EF 60-65 % RV normal    Review of Systems: [y] = yes, [ ]  = no   General: Weight gain [ ] ; Weight loss [ ] ; Anorexia [ ] ; Fatigue [ ] ; Fever [ ] ; Chills [ ] ; Weakness [ ]   Cardiac: Chest pain/pressure [ ] ; Resting SOB [ ] ; Exertional SOB [ ] ; Orthopnea [ ] ; Pedal Edema [ ] ; Palpitations [ ] ; Syncope [ ] ; Presyncope [ ] ; Paroxysmal nocturnal dyspnea[ ]   Pulmonary: Cough [ ] ; Wheezing[ ] ; Hemoptysis[ ] ; Sputum [ ] ; Snoring [ ]   GI: Vomiting[ ] ; Dysphagia[ ] ; Melena[ ] ; Hematochezia [ ] ; Heartburn[ ] ; Abdominal pain [ ] ; Constipation [ ] ; Diarrhea [ ] ; BRBPR [ ]   GU: Hematuria[ ] ; Dysuria [ ] ; Nocturia[ ]   Vascular: Pain in legs with walking [ ] ; Pain in feet with lying flat [ ] ; Non-healing sores [ ] ; Stroke [ ] ; TIA [ ] ; Slurred speech [ ] ;  Neuro: Headaches[ ] ; Vertigo[ ] ; Seizures[ ] ; Paresthesias[ ] ;Blurred vision [ ] ; Diplopia [ ] ; Vision changes [ ]   Ortho/Skin: Arthritis [ ] ; Joint pain [ ] ; Muscle pain [ ] ; Joint swelling [ ] ; Back Pain [ ] ; Rash [ ]   Psych: Depression[ ] ; Anxiety[ ]   Heme: Bleeding problems [ ] ; Clotting disorders [ ] ; Anemia [ ]   Endocrine: Diabetes [ ] ; Thyroid dysfunction[ ]    Past Medical History:  Diagnosis Date   Acute exacerbation of CHF (congestive heart failure) (HCC) 05/04/2021   Asthma    Depression    Diabetes mellitus type 2, uncontrolled    Extreme obesity    Family history of  adverse reaction to anesthesia    " my grandmother had an allergic reaction   Hypertension    Mental disorder    Obesity    OSA (obstructive sleep apnea)     Current Outpatient Medications  Medication Sig Dispense Refill   Accu-Chek Softclix Lancets lancets Use as instructed 100 each 0   acetaminophen (TYLENOL) 500 MG tablet Take 2 tablets (1,000 mg total) by mouth every 6 (six) hours as needed for fever, mild pain, moderate pain or headache. 30 tablet 0   albuterol (VENTOLIN HFA) 108 (90 Base) MCG/ACT inhaler Inhale 1-2 puffs into the lungs every 6 (six) hours as needed for wheezing or shortness of breath. 18 g 6   amLODipine (NORVASC) 10 MG tablet Take 1 tablet (10 mg total) by mouth daily. 30 tablet 0   benzonatate (TESSALON) 100 MG capsule Take 1 capsule (100 mg total) by mouth every 8 (eight) hours. 21 capsule 0   Dulaglutide (TRULICITY) 0.75 MG/0.5ML SOPN Inject 0.75 mg into the skin once a week. 2 mL 0   famotidine (PEPCID) 20 MG tablet TAKE 1 TABLET BY MOUTH TWICE A DAY (Patient not taking: Reported on 05/04/2021) 60 tablet 0   insulin glargine (LANTUS) 100 UNIT/ML Solostar Pen Inject 45 Units into the skin daily. 15 mL 0  Insulin Pen Needle 32G X 4 MM MISC Use as directed 100 each 0   metFORMIN (GLUCOPHAGE) 1000 MG tablet Take 1 tablet (1,000 mg total) by mouth 2 (two) times daily with a meal. 60 tablet 0   ondansetron (ZOFRAN ODT) 4 MG disintegrating tablet Take 1 tablet (4 mg total) by mouth every 8 (eight) hours as needed for nausea or vomiting. 10 tablet 0   pantoprazole (PROTONIX) 40 MG tablet TAKE 1 TABLET BY MOUTH EVERY DAY (Patient not taking: Reported on 05/04/2021) 30 tablet 0   torsemide (DEMADEX) 20 MG tablet Take 2 tablets (40 mg total) by mouth 2 (two) times daily. 120 tablet 0   No current facility-administered medications for this visit.    Allergies  Allergen Reactions   Bee Pollen Other (See Comments)    Sneezing and eye burning   Fish Allergy Itching and  Rash   Lisinopril Rash    Caused a rash and caused her to sweat a lot.      Social History   Socioeconomic History   Marital status: Significant Other    Spouse name: Not on file   Number of children: 0   Years of education: Not on file   Highest education level: 11th grade  Occupational History   Occupation: Estate agent at [employer]  Tobacco Use   Smoking status: Former    Packs/day: 0.50    Years: 10.00    Pack years: 5.00    Types: Cigarettes    Quit date: 06/15/2018    Years since quitting: 2.9   Smokeless tobacco: Never  Vaping Use   Vaping Use: Former   Quit date: 06/02/2020   Substances: Nicotine, Flavoring  Substance and Sexual Activity   Alcohol use: Not Currently   Drug use: Not Currently    Types: Marijuana    Comment: 10/2020 last use   Sexual activity: Yes    Partners: Male    Birth control/protection: Condom  Other Topics Concern   Not on file  Social History Narrative   Not on file   Social Determinants of Health   Financial Resource Strain: Low Risk    Difficulty of Paying Living Expenses: Not very hard  Food Insecurity: Geophysicist/field seismologist Present   Worried About Programme researcher, broadcasting/film/video in the Last Year: Sometimes true   Barista in the Last Year: Sometimes true  Transportation Needs: Unmet Transportation Needs   Lack of Transportation (Medical): Yes   Lack of Transportation (Non-Medical): Yes  Physical Activity: Not on file  Stress: Not on file  Social Connections: Not on file  Intimate Partner Violence: Not At Risk   Fear of Current or Ex-Partner: No   Emotionally Abused: No   Physically Abused: No   Sexually Abused: No      Family History  Problem Relation Age of Onset   Diabetes Mellitus II Maternal Grandmother     There were no vitals filed for this visit.  PHYSICAL EXAM: General:  Well appearing. No respiratory difficulty HEENT: normal Neck: supple. no JVD. Carotids 2+ bilat; no bruits. No lymphadenopathy or thryomegaly  appreciated. Cor: PMI nondisplaced. Regular rate & rhythm. No rubs, gallops or murmurs. Lungs: clear Abdomen: soft, nontender, nondistended. No hepatosplenomegaly. No bruits or masses. Good bowel sounds. Extremities: no cyanosis, clubbing, rash, edema Neuro: alert & oriented x 3, cranial nerves grossly intact. moves all 4 extremities w/o difficulty. Affect pleasant.  ECG:   ASSESSMENT & PLAN: HFpEF  Echo EF 60-65% RV  normal  NYHA *** GDMT  Diuretic- BB- Ace/ARB/ARNI MRA SGLT2i  2. OSA   3. Obesity     Referred to HFSW (PCP, Medications, Transportation, ETOH Abuse, Drug Abuse, Insurance, Financial ): Yes or No Refer to Pharmacy: Yes or No Refer to Home Health: Yes on No Refer to Advanced Heart Failure Clinic: Yes or no  Refer to General Cardiology: Yes or No  Follow up

## 2021-05-12 NOTE — Telephone Encounter (Signed)
Transition Care Management Unsuccessful Follow-up Telephone Call  Date of discharge and from where:  05/07/2021, [facility]   Attempts:  1st Attempt  Reason for unsuccessful TCM follow-up call:  Left voice message on # [phone].  Call back requested to this CM.  Need to clarify upcoming appointments as patient has been scheduled with [name], NP as well as Dr Laural Benes.  There is a note that the patient is requesting a new PCP

## 2021-05-13 ENCOUNTER — Telehealth: Payer: Self-pay | Admitting: Pulmonary Disease

## 2021-05-13 ENCOUNTER — Encounter (HOSPITAL_COMMUNITY): Payer: Medicaid Other

## 2021-05-13 ENCOUNTER — Telehealth (HOSPITAL_COMMUNITY): Payer: Self-pay

## 2021-05-13 ENCOUNTER — Telehealth: Payer: Self-pay

## 2021-05-13 DIAGNOSIS — G4733 Obstructive sleep apnea (adult) (pediatric): Secondary | ICD-10-CM

## 2021-05-13 NOTE — Telephone Encounter (Signed)
Call returned to Yvette Gibbs states the BI-pap home machine will go no higher than 25. Would like order changed to 25/20.  This is the highest setting for Home BI-pap machines.   VS please advise if we can change order. She has an appt this afternoon. Thanks :)

## 2021-05-13 NOTE — Telephone Encounter (Signed)
Call attempted to confirm HV TOC appt today at 10AM. HIPPA appropriate VM left with callback number.   Ozella Rocks, MSN, RN Heart Failure Nurse Navigator [phone]

## 2021-05-13 NOTE — Telephone Encounter (Signed)
Then can she get set up with a home ventilator (trilogy or astral)?

## 2021-05-13 NOTE — Telephone Encounter (Signed)
Settings would depend on whether she can get a home ventilator and which type.  If not, then send order for auto Bipap with max IPAP 25, min EPAP 10, PS 4 cm H2O.

## 2021-05-13 NOTE — Telephone Encounter (Signed)
Transition Care Management Follow-up Telephone Call Date of discharge and from where: 05/07/2021, [facility] How have you been since you were released from the hospital? She said she was just tired and wanted to sleep.  No complaints voiced other than being tired.  Any questions or concerns? No  Items Reviewed: Did the pt receive and understand the discharge instructions provided? Yes  Medications obtained and verified? Yes - She said she has all of her medications and did not have any questions about her med regime.  Other? No  Any new allergies since your discharge? No  Dietary orders reviewed? Yes Do you have support at home? Yes , she is staying with her grandmother.   Home Care and Equipment/Supplies: Were home health services ordered? no If so, what is the name of the agency? N/a  Has the agency set up a time to come to the patient's home? not applicable Were any new equipment or medical supplies ordered?  No What is the name of the medical supply agency? N/a Were you able to get the supplies/equipment? not applicable Do you have any questions related to the use of the equipment or supplies? No  She has a trilogy that has not been working.  [facility] was notified when she was in the hospital and she is waiting to hear from them.    Functional Questionnaire: (I = Independent and D = Dependent)       ADLs:  independent    Follow up appointments reviewed:  PCP Hospital f/u appt confirmed? Yes  Scheduled to see [name], NP  on 05/20/2021.  She did not mention wanting a new PCP.  Specialist Hospital f/u appt confirmed?  She had an appointment with Heart & Vascular this morning but she missed the appointment. She said she will need to call to re-schedule.  Are transportation arrangements needed? No  - she uses [facility] transportation and the phone number is on her AVS If their condition worsens, is the pt aware to call PCP or go to the Emergency Dept.? Yes Was  the patient provided with contact information for the PCP's office or ED? Yes Was to pt encouraged to call back with questions or concerns? Yes

## 2021-05-14 NOTE — Telephone Encounter (Signed)
Called and spoke with Yvette Gibbs to let him know about the message from Dr. Craige Cotta. He said that he can do a BIPAP for the patient. Advised him of the settings given by Dr. Craige Cotta and told him that I would also put in an order to be sent over to them. Order sent. He expressed understanding. Nothing further needed at this time.

## 2021-05-14 NOTE — Telephone Encounter (Signed)
LMTCB

## 2021-05-15 DIAGNOSIS — Z419 Encounter for procedure for purposes other than remedying health state, unspecified: Secondary | ICD-10-CM | POA: Diagnosis not present

## 2021-05-20 ENCOUNTER — Ambulatory Visit (INDEPENDENT_AMBULATORY_CARE_PROVIDER_SITE_OTHER): Payer: Medicaid Other | Admitting: Primary Care

## 2021-05-20 ENCOUNTER — Other Ambulatory Visit: Payer: Self-pay

## 2021-05-20 ENCOUNTER — Encounter (INDEPENDENT_AMBULATORY_CARE_PROVIDER_SITE_OTHER): Payer: Self-pay | Admitting: Primary Care

## 2021-05-20 VITALS — BP 139/95 | HR 94 | Temp 97.3°F | Ht 60.0 in | Wt 380.0 lb

## 2021-05-20 DIAGNOSIS — E119 Type 2 diabetes mellitus without complications: Secondary | ICD-10-CM | POA: Diagnosis not present

## 2021-05-20 DIAGNOSIS — Z7689 Persons encountering health services in other specified circumstances: Secondary | ICD-10-CM | POA: Diagnosis not present

## 2021-05-20 DIAGNOSIS — Z09 Encounter for follow-up examination after completed treatment for conditions other than malignant neoplasm: Secondary | ICD-10-CM | POA: Diagnosis not present

## 2021-05-20 DIAGNOSIS — Z6841 Body Mass Index (BMI) 40.0 and over, adult: Secondary | ICD-10-CM

## 2021-05-20 DIAGNOSIS — Z760 Encounter for issue of repeat prescription: Secondary | ICD-10-CM

## 2021-05-20 DIAGNOSIS — J4520 Mild intermittent asthma, uncomplicated: Secondary | ICD-10-CM | POA: Diagnosis not present

## 2021-05-20 NOTE — Patient Instructions (Signed)
Influenza, Adult °Influenza is also called "the flu." It is an infection in the lungs, nose, and throat (respiratory tract). It spreads easily from person to person (is contagious). The flu causes symptoms that are like a cold, along with high fever and body aches. °What are the causes? °This condition is caused by the influenza virus. You can get the virus by: °Breathing in droplets that are in the air after a person infected with the flu coughed or sneezed. °Touching something that has the virus on it and then touching your mouth, nose, or eyes. °What increases the risk? °Certain things may make you more likely to get the flu. These include: °Not washing your hands often. °Having close contact with many people during cold and flu season. °Touching your mouth, eyes, or nose without first washing your hands. °Not getting a flu shot every year. °You may have a higher risk for the flu, and serious problems, such as a lung infection (pneumonia), if you: °Are older than 65. °Are pregnant. °Have a weakened disease-fighting system (immune system) because of a disease or because you are taking certain medicines. °Have a long-term (chronic) condition, such as: °Heart, kidney, or lung disease. °Diabetes. °Asthma. °Have a liver disorder. °Are very overweight (morbidly obese). °Have anemia. °What are the signs or symptoms? °Symptoms usually begin suddenly and last 4-14 days. They may include: °Fever and chills. °Headaches, body aches, or muscle aches. °Sore throat. °Cough. °Runny or stuffy (congested) nose. °Feeling discomfort in your chest. °Not wanting to eat as much as normal. °Feeling weak or tired. °Feeling dizzy. °Feeling sick to your stomach or throwing up. °How is this treated? °If the flu is found early, you can be treated with antiviral medicine. This can help to reduce how bad the illness is and how long it lasts. This may be given by mouth or through an IV tube. °Taking care of yourself at home can help your  symptoms get better. Your doctor may want you to: °Take over-the-counter medicines. °Drink plenty of fluids. °The flu often goes away on its own. If you have very bad symptoms or other problems, you may be treated in a hospital. °Follow these instructions at home: °  °Activity °Rest as needed. Get plenty of sleep. °Stay home from work or school as told by your doctor. °Do not leave home until you do not have a fever for 24 hours without taking medicine. °Leave home only to go to your doctor. °Eating and drinking °Take an ORS (oral rehydration solution). This is a drink that is sold at pharmacies and stores. °Drink enough fluid to keep your pee pale yellow. °Drink clear fluids in small amounts as you are able. Clear fluids include: °Water. °Ice chips. °Fruit juice mixed with water. °Low-calorie sports drinks. °Eat bland foods that are easy to digest. Eat small amounts as you are able. These foods include: °Bananas. °Applesauce. °Rice. °Lean meats. °Toast. °Crackers. °Do not eat or drink: °Fluids that have a lot of sugar or caffeine. °Alcohol. °Spicy or fatty foods. °General instructions °Take over-the-counter and prescription medicines only as told by your doctor. °Use a cool mist humidifier to add moisture to the air in your home. This can make it easier for you to breathe. °When using a cool mist humidifier, clean it daily. Empty water and replace with clean water. °Cover your mouth and nose when you cough or sneeze. °Wash your hands with soap and water often and for at least 20 seconds. This is also important after   you cough or sneeze. If you cannot use soap and water, use alcohol-based hand sanitizer. °Keep all follow-up visits. °How is this prevented? ° °Get a flu shot every year. You may get the flu shot in late summer, fall, or winter. Ask your doctor when you should get your flu shot. °Avoid contact with people who are sick during fall and winter. This is cold and flu season. °Contact a doctor if: °You get  new symptoms. °You have: °Chest pain. °Watery poop (diarrhea). °A fever. °Your cough gets worse. °You start to have more mucus. °You feel sick to your stomach. °You throw up. °Get help right away if you: °Have shortness of breath. °Have trouble breathing. °Have skin or nails that turn a bluish color. °Have very bad pain or stiffness in your neck. °Get a sudden headache. °Get sudden pain in your face or ear. °Cannot eat or drink without throwing up. °These symptoms may represent a serious problem that is an emergency. Get medical help right away. Call your local emergency services (911 in the [place] °Do not wait to see if the symptoms will go away. °Do not drive yourself to the hospital. °Summary °Influenza is also called "the flu." It is an infection in the lungs, nose, and throat. It spreads easily from person to person. °Take over-the-counter and prescription medicines only as told by your doctor. °Getting a flu shot every year is the best way to not get the flu. °This information is not intended to replace advice given to you by your health care provider. Make sure you discuss any questions you have with your health care provider. °Document Revised: 01/19/2020 Document Reviewed: 01/19/2020 °Elsevier Patient Education © 2022 [employer] ° °

## 2021-05-21 MED ORDER — METFORMIN HCL 1000 MG PO TABS: 1000.0000 mg | ORAL_TABLET | Freq: Two times a day (BID) | ORAL | 1 refills | Status: DC

## 2021-05-21 MED ORDER — TRULICITY 0.75 MG/0.5ML ~~LOC~~ SOAJ: 0.7500 mg | SUBCUTANEOUS | 3 refills | Status: AC

## 2021-05-21 MED ORDER — ALBUTEROL SULFATE HFA 108 (90 BASE) MCG/ACT IN AERS: 1.0000 | INHALATION_SPRAY | Freq: Four times a day (QID) | RESPIRATORY_TRACT | 3 refills | Status: DC | PRN

## 2021-05-21 MED ORDER — INSULIN GLARGINE 100 UNIT/ML SOLOSTAR PEN: 45.0000 [IU] | PEN_INJECTOR | Freq: Every day | SUBCUTANEOUS | 0 refills | Status: DC

## 2021-05-21 NOTE — Progress Notes (Signed)
[facility]   Subjective:   [name] is a 30 y.o. female presents for hospital follow up.  She presented to the ED on 04/30/21 with  for body aches, chills and fever and tested positive for flu- discharge to home stable. Return to ED with shortness of breath, chest pain . She was admitted to the hospital was 05/04/21, patient was discharged from the hospital on 05/07/21, patient was admitted for: Morbid obesity (HCC),OSA (obstructive sleep apnea), Influenza, Obesity hypoventilation syndrome (HCC), Heart failure with preserved ejection fraction (HCC), Acute exacerbation of CHF (congestive heart failure) (HCC) and hypoxia. Today she is feeling a lot better no complaints of body aches, chills ,fever,shortness of breath,or chest pain.  Past Medical History:  Diagnosis Date   Acute exacerbation of CHF (congestive heart failure) (HCC) 05/04/2021   Asthma    Depression    Diabetes mellitus type 2, uncontrolled    Extreme obesity    Family history of adverse reaction to anesthesia    " my grandmother had an allergic reaction   Hypertension    Mental disorder    Obesity    OSA (obstructive sleep apnea)      Allergies  Allergen Reactions   Bee Pollen Other (See Comments)    Sneezing and eye burning   Fish Allergy Itching and Rash   Lisinopril Rash    Caused a rash and caused her to sweat a lot.      Current Outpatient Medications on File Prior to Visit  Medication Sig Dispense Refill   Accu-Chek Softclix Lancets lancets Use as instructed 100 each 0   acetaminophen (TYLENOL) 500 MG tablet Take 2 tablets (1,000 mg total) by mouth every 6 (six) hours as needed for fever, mild pain, moderate pain or headache. 30 tablet 0   albuterol (VENTOLIN HFA) 108 (90 Base) MCG/ACT inhaler Inhale 1-2 puffs into the lungs every 6 (six) hours as needed for wheezing or shortness of breath. 18 g 6   amLODipine (NORVASC) 10 MG tablet Take 1 tablet (10 mg total) by mouth daily. 30  tablet 0   benzonatate (TESSALON) 100 MG capsule Take 1 capsule (100 mg total) by mouth every 8 (eight) hours. 21 capsule 0   Dulaglutide (TRULICITY) 0.75 MG/0.5ML SOPN Inject 0.75 mg into the skin once a week. 2 mL 0   famotidine (PEPCID) 20 MG tablet TAKE 1 TABLET BY MOUTH TWICE A DAY 60 tablet 0   insulin glargine (LANTUS) 100 UNIT/ML Solostar Pen Inject 45 Units into the skin daily. 15 mL 0   Insulin Pen Needle 32G X 4 MM MISC Use as directed 100 each 0   metFORMIN (GLUCOPHAGE) 1000 MG tablet Take 1 tablet (1,000 mg total) by mouth 2 (two) times daily with a meal. 60 tablet 0   ondansetron (ZOFRAN ODT) 4 MG disintegrating tablet Take 1 tablet (4 mg total) by mouth every 8 (eight) hours as needed for nausea or vomiting. 10 tablet 0   pantoprazole (PROTONIX) 40 MG tablet TAKE 1 TABLET BY MOUTH EVERY DAY 30 tablet 0   torsemide (DEMADEX) 20 MG tablet Take 2 tablets (40 mg total) by mouth 2 (two) times daily. 120 tablet 0   No current facility-administered medications on file prior to visit.     Review of System: Pertinent positive and negatives noted in HPI  Objective:  BP (!) 139/95 (BP Location: Right Arm, Patient Position: Sitting, Cuff Size: Large)   Pulse 94   Temp (!) 97.3 F (36.3 C) (Temporal)  Ht 5' (1.524 m)   Wt (!) 380 lb (172.4 kg)   LMP 05/07/2021 (Exact Date)   SpO2 98%   BMI 74.21 kg/m   Filed Weights   05/20/21 1449  Weight: (!) 380 lb (172.4 kg)    Physical Exam: General Appearance: Well nourished,severe morbid obesity  in no apparent distress. Eyes: PERRLA, EOMs, conjunctiva no swelling or erythema Sinuses: No Frontal/maxillary tenderness ENT/Mouth: Ext aud canals clear, TMs without erythema, bulging. Hearing normal.  Neck: Supple thick , thyroid normal.  Respiratory: Respiratory effort normal, BS equal bilaterally with rhonchi and wheezing.  Cardio: RRR with no MRGs. Brisk peripheral pulses without edema.  Abdomen: Soft, + BS.  Non tender, no guarding,  rebound, hernias, masses. Lymphatics: Non tender without lymphadenopathy.  Musculoskeletal: Full ROM, 5/5 strength, normal gait.  Skin: Warm, dry without rashes, lesions, ecchymosis.  Neuro: Cranial nerves intact. Normal muscle tone, no cerebellar symptoms. Sensation intact.  Psych: Awake and oriented X 3, normal affect, Insight and Judgment appropriate.    Assessment:  Yvette Gibbs was seen today for hospitalization follow-up.  Diagnoses and all orders for this visit:  Hospital discharge follow-up Retrieved from discharge  Follow up with [facility]; please call to arrange transportation with your insurance provider Follow up with [facility]; please call to service your Trilogy Go to [facility] (Cardiology); Tuesday, November 29 @ 10AM for HV TOC appt [facility]. Completed  Bring all medications with you.  FREE valet parking at [employer], off [address]. (or use Medicaid tranportation services). Go to Marcine Matar, MD (Internal Medicine) in 58 days (07/04/2021); New Primary Care Appointment scheduled with Dr. Laural Benes 07/04/21 at 8:50am at [facility]. Seen PCP [name] today .   Morbid obesity (HCC) Morbid Obesity > 40 indicating an excess in caloric intake and underlining conditions. Obesity hypoventilation syndrome (HCC), OSA, CHF  Mild intermittent asthma without complication -     albuterol (VENTOLIN HFA) 108 (90 Base) MCG/ACT inhaler; Inhale 1-2 puffs into the lungs every 6 (six) hours as needed for wheezing or shortness of breath. Followed by Dr. Craige Cotta pulmonologist   Type 2 diabetes mellitus without complication, without long-term current use of insulin (HCC) 05/05/21 A!C 8.8. monitor foods that are high in carbohydrates are the following rice, potatoes, breads, sugars, and pastas.  Reduction in the intake (eating) will assist in lowering your blood sugars.  -      Dulaglutide (TRULICITY) 0.75 MG/0.5ML SOPN; Inject 0.75 mg into the skin once a week. -     metFORMIN (GLUCOPHAGE) 1000 MG tablet; Take 1 tablet (1,000 mg total) by mouth 2 (two) times daily with a meal.  Medication refill -     albuterol (VENTOLIN HFA) 108 (90 Base) MCG/ACT inhaler; Inhale 1-2 puffs into the lungs every 6 (six) hours as needed for wheezing or shortness of breath. -     insulin glargine (LANTUS) 100 UNIT/ML Solostar Pen; Inject 45 Units into the skin daily. -     Dulaglutide (TRULICITY) 0.75 MG/0.5ML SOPN; Inject 0.75 mg into the skin once a week. -     metFORMIN (GLUCOPHAGE) 1000 MG tablet; Take 1 tablet (1,000 mg total) by mouth 2 (two) times daily with a meal.   This note has been created with Education officer, environmental. Any transcriptional errors are unintentional.   Grayce Sessions, NP 05/21/2021, 12:05 PM

## 2021-05-22 DIAGNOSIS — J9601 Acute respiratory failure with hypoxia: Secondary | ICD-10-CM | POA: Diagnosis not present

## 2021-05-22 DIAGNOSIS — D649 Anemia, unspecified: Secondary | ICD-10-CM | POA: Diagnosis not present

## 2021-05-22 DIAGNOSIS — J45901 Unspecified asthma with (acute) exacerbation: Secondary | ICD-10-CM | POA: Diagnosis not present

## 2021-05-22 DIAGNOSIS — E662 Morbid (severe) obesity with alveolar hypoventilation: Secondary | ICD-10-CM | POA: Diagnosis not present

## 2021-05-26 DIAGNOSIS — J45901 Unspecified asthma with (acute) exacerbation: Secondary | ICD-10-CM | POA: Diagnosis not present

## 2021-05-26 DIAGNOSIS — J9601 Acute respiratory failure with hypoxia: Secondary | ICD-10-CM | POA: Diagnosis not present

## 2021-05-26 DIAGNOSIS — D649 Anemia, unspecified: Secondary | ICD-10-CM | POA: Diagnosis not present

## 2021-05-26 DIAGNOSIS — E662 Morbid (severe) obesity with alveolar hypoventilation: Secondary | ICD-10-CM | POA: Diagnosis not present

## 2021-05-30 ENCOUNTER — Telehealth: Payer: Self-pay | Admitting: Pulmonary Disease

## 2021-05-30 DIAGNOSIS — Z7689 Persons encountering health services in other specified circumstances: Secondary | ICD-10-CM | POA: Diagnosis not present

## 2021-05-30 DIAGNOSIS — G4733 Obstructive sleep apnea (adult) (pediatric): Secondary | ICD-10-CM | POA: Diagnosis not present

## 2021-05-30 DIAGNOSIS — R0609 Other forms of dyspnea: Secondary | ICD-10-CM

## 2021-05-30 DIAGNOSIS — Z6841 Body Mass Index (BMI) 40.0 and over, adult: Secondary | ICD-10-CM

## 2021-05-30 DIAGNOSIS — R0683 Snoring: Secondary | ICD-10-CM

## 2021-05-30 NOTE — Telephone Encounter (Addendum)
Spoke to patient.  Patient is requesting an order to d/c Trilogy machine. Insurance will not pay for bipap and Trilogy.  Dr. Craige Cotta, please advise. Thanks

## 2021-06-02 NOTE — Telephone Encounter (Signed)
Okay to send order to d/c trilogy machine.

## 2021-06-03 ENCOUNTER — Telehealth: Payer: Self-pay | Admitting: Pulmonary Disease

## 2021-06-03 NOTE — Telephone Encounter (Signed)
ATC patient to get her scheduled for OV after CPAP set up, unable to leave message due to mailbox not being set up.  If patient calls back please schedule her for OV with Dr. Craige Cotta in February.

## 2021-06-03 NOTE — Telephone Encounter (Signed)
Order placed.   LM informing patient order has been placed.   Nothing further needed at this time.  

## 2021-06-15 DIAGNOSIS — G4733 Obstructive sleep apnea (adult) (pediatric): Secondary | ICD-10-CM | POA: Diagnosis not present

## 2021-06-15 DIAGNOSIS — Z419 Encounter for procedure for purposes other than remedying health state, unspecified: Secondary | ICD-10-CM | POA: Diagnosis not present

## 2021-06-19 ENCOUNTER — Inpatient Hospital Stay: Payer: Medicaid Other | Admitting: Internal Medicine

## 2021-06-26 DIAGNOSIS — D649 Anemia, unspecified: Secondary | ICD-10-CM | POA: Diagnosis not present

## 2021-06-26 DIAGNOSIS — J9601 Acute respiratory failure with hypoxia: Secondary | ICD-10-CM | POA: Diagnosis not present

## 2021-06-26 DIAGNOSIS — E662 Morbid (severe) obesity with alveolar hypoventilation: Secondary | ICD-10-CM | POA: Diagnosis not present

## 2021-06-26 DIAGNOSIS — J45901 Unspecified asthma with (acute) exacerbation: Secondary | ICD-10-CM | POA: Diagnosis not present

## 2021-06-27 NOTE — Telephone Encounter (Signed)
Attempted to call patient to schedule OV, but received a busy dial tone. Sent mychart message for patient to call to set up an appointment in February.

## 2021-07-04 ENCOUNTER — Ambulatory Visit: Payer: Medicaid Other | Admitting: Internal Medicine

## 2021-07-11 NOTE — Telephone Encounter (Signed)
ATC patient to schedule OV after CPAP set up, unable to leave message- received busy dial tone.

## 2021-07-16 DIAGNOSIS — G4733 Obstructive sleep apnea (adult) (pediatric): Secondary | ICD-10-CM | POA: Diagnosis not present

## 2021-07-16 DIAGNOSIS — Z419 Encounter for procedure for purposes other than remedying health state, unspecified: Secondary | ICD-10-CM | POA: Diagnosis not present

## 2021-07-27 DIAGNOSIS — E662 Morbid (severe) obesity with alveolar hypoventilation: Secondary | ICD-10-CM | POA: Diagnosis not present

## 2021-07-27 DIAGNOSIS — J45901 Unspecified asthma with (acute) exacerbation: Secondary | ICD-10-CM | POA: Diagnosis not present

## 2021-07-27 DIAGNOSIS — J9601 Acute respiratory failure with hypoxia: Secondary | ICD-10-CM | POA: Diagnosis not present

## 2021-07-27 DIAGNOSIS — D649 Anemia, unspecified: Secondary | ICD-10-CM | POA: Diagnosis not present

## 2021-08-05 ENCOUNTER — Ambulatory Visit (INDEPENDENT_AMBULATORY_CARE_PROVIDER_SITE_OTHER): Payer: Medicaid Other | Admitting: Primary Care

## 2021-08-13 DIAGNOSIS — G4733 Obstructive sleep apnea (adult) (pediatric): Secondary | ICD-10-CM | POA: Diagnosis not present

## 2021-08-13 DIAGNOSIS — Z419 Encounter for procedure for purposes other than remedying health state, unspecified: Secondary | ICD-10-CM | POA: Diagnosis not present

## 2021-08-15 ENCOUNTER — Other Ambulatory Visit: Payer: Self-pay

## 2021-08-15 ENCOUNTER — Emergency Department (HOSPITAL_COMMUNITY)
Admission: EM | Admit: 2021-08-15 | Discharge: 2021-08-16 | Discharge status: Against Medical Advice | Payer: Medicaid Other | Attending: Emergency Medicine | Admitting: Emergency Medicine

## 2021-08-15 ENCOUNTER — Encounter (HOSPITAL_COMMUNITY): Payer: Self-pay | Admitting: Emergency Medicine

## 2021-08-15 DIAGNOSIS — N9489 Other specified conditions associated with female genital organs and menstrual cycle: Secondary | ICD-10-CM | POA: Diagnosis not present

## 2021-08-15 DIAGNOSIS — Z5321 Procedure and treatment not carried out due to patient leaving prior to being seen by health care provider: Secondary | ICD-10-CM | POA: Diagnosis not present

## 2021-08-15 DIAGNOSIS — Z20822 Contact with and (suspected) exposure to covid-19: Secondary | ICD-10-CM | POA: Diagnosis not present

## 2021-08-15 DIAGNOSIS — I517 Cardiomegaly: Secondary | ICD-10-CM | POA: Diagnosis not present

## 2021-08-15 DIAGNOSIS — I499 Cardiac arrhythmia, unspecified: Secondary | ICD-10-CM | POA: Diagnosis not present

## 2021-08-15 DIAGNOSIS — R0602 Shortness of breath: Secondary | ICD-10-CM | POA: Insufficient documentation

## 2021-08-15 DIAGNOSIS — R0789 Other chest pain: Secondary | ICD-10-CM | POA: Insufficient documentation

## 2021-08-15 DIAGNOSIS — Z3201 Encounter for pregnancy test, result positive: Secondary | ICD-10-CM | POA: Insufficient documentation

## 2021-08-15 DIAGNOSIS — R079 Chest pain, unspecified: Secondary | ICD-10-CM | POA: Diagnosis not present

## 2021-08-15 DIAGNOSIS — Z743 Need for continuous supervision: Secondary | ICD-10-CM | POA: Diagnosis not present

## 2021-08-15 DIAGNOSIS — O2690 Pregnancy related conditions, unspecified, unspecified trimester: Secondary | ICD-10-CM | POA: Diagnosis not present

## 2021-08-15 DIAGNOSIS — R739 Hyperglycemia, unspecified: Secondary | ICD-10-CM | POA: Diagnosis not present

## 2021-08-15 NOTE — ED Provider Triage Note (Signed)
Emergency Medicine Provider Triage Evaluation Note ? ?Yvette Gibbs , a 31 y.o. female  was evaluated in triage.  Pt complains of central chest pain onset yesterday.  Patient was at rest when she noted the chest pain described as a pressure sensation.  Her chest pain is intermittent.  She noted that she lifted her mattress while cleaning 5 days ago.  Has associated shortness of breath, nausea, vomiting.  Has not tried medications for his symptoms.  Denies fever, chills.  Denies past medical history of MI, cardiac catheterization, stents.  Patient notes that she was told she has diabetes and high blood pressure, she is not taking any medications for these at this time.  Patient also notes that she took 2 home pregnancy test that were positive. ? ?Review of Systems  ?Positive: As per HPI above ?Negative: As per HPI above ? ?Physical Exam  ?BP (!) 168/100   Pulse (!) 104   Temp 99.6 ?F (37.6 ?C) (Oral)   Resp 20   Ht 5\' 2"  (1.575 m)   Wt (!) 174 kg   LMP 05/07/2021 (Exact Date)   SpO2 98%   BMI 70.16 kg/m?  ?Gen:   Awake, no distress   ?Resp:  Normal effort  ?MSK:   Moves extremities without difficulty  ?Other:  Sternal chest wall tenderness to palpation without obvious deformity, step-offs, crepitus.  No abdominal tenderness to palpation. ? ?Medical Decision Making  ?Medically screening exam initiated at 11:37 PM.  Appropriate orders placed.  Shanele Tonkinson was informed that the remainder of the evaluation will be completed by another provider, this initial triage assessment does not replace that evaluation, and the importance of remaining in the ED until their evaluation is complete. ?  ?Keil Pickering A, PA-C ?08/15/21 2341 ? ?

## 2021-08-15 NOTE — ED Triage Notes (Signed)
Patient arrived with EMS from home reports central chest pain this evening with SOB , no emesis or diaphoresis , denies cough or fever , hypertensive 180/110 , CBG= 374 , patient stated positive pregnancy test .  ?

## 2021-08-16 ENCOUNTER — Emergency Department (HOSPITAL_COMMUNITY): Payer: Medicaid Other

## 2021-08-16 DIAGNOSIS — R079 Chest pain, unspecified: Secondary | ICD-10-CM | POA: Diagnosis not present

## 2021-08-16 DIAGNOSIS — I517 Cardiomegaly: Secondary | ICD-10-CM | POA: Diagnosis not present

## 2021-08-16 LAB — BASIC METABOLIC PANEL
Anion gap: 7 (ref 5–15)
BUN: 8 mg/dL (ref 6–20)
CO2: 31 mmol/L (ref 22–32)
Calcium: 9 mg/dL (ref 8.9–10.3)
Chloride: 98 mmol/L (ref 98–111)
Creatinine, Ser: 0.69 mg/dL (ref 0.44–1.00)
GFR, Estimated: >60 mL/min (ref >60)
Glucose, Bld: 282 mg/dL — ABNORMAL HIGH (ref 70–99)
Potassium: 4.2 mmol/L (ref 3.5–5.1)
Sodium: 136 mmol/L (ref 135–145)

## 2021-08-16 LAB — CBC
HCT: 37.4 % (ref 36.0–46.0)
Hemoglobin: 11.7 g/dL — ABNORMAL LOW (ref 12.0–15.0)
MCH: 28.2 pg (ref 26.0–34.0)
MCHC: 31.3 g/dL (ref 30.0–36.0)
MCV: 90.1 fL (ref 80.0–100.0)
Platelets: 229 10*3/uL (ref 150–400)
RBC: 4.15 MIL/uL (ref 3.87–5.11)
RDW: 14 % (ref 11.5–15.5)
WBC: 6.2 10*3/uL (ref 4.0–10.5)
nRBC: 0 % (ref 0.0–0.2)

## 2021-08-16 LAB — RESP PANEL BY RT-PCR (FLU A&B, COVID) ARPGX2
Influenza A by PCR: NEGATIVE
Influenza B by PCR: NEGATIVE
SARS Coronavirus 2 by RT PCR: NEGATIVE

## 2021-08-16 LAB — TROPONIN I (HIGH SENSITIVITY)
Troponin I (High Sensitivity): 5 ng/L (ref <18)
Troponin I (High Sensitivity): 5 ng/L (ref <18)

## 2021-08-16 LAB — I-STAT BETA HCG BLOOD, ED (MC, WL, AP ONLY): I-stat hCG, quantitative: <5 m[IU]/mL (ref <5)

## 2021-08-16 NOTE — ED Notes (Signed)
Iv remove before patient left ?

## 2021-08-24 DIAGNOSIS — E662 Morbid (severe) obesity with alveolar hypoventilation: Secondary | ICD-10-CM | POA: Diagnosis not present

## 2021-08-24 DIAGNOSIS — J45901 Unspecified asthma with (acute) exacerbation: Secondary | ICD-10-CM | POA: Diagnosis not present

## 2021-08-24 DIAGNOSIS — D649 Anemia, unspecified: Secondary | ICD-10-CM | POA: Diagnosis not present

## 2021-08-24 DIAGNOSIS — J9601 Acute respiratory failure with hypoxia: Secondary | ICD-10-CM | POA: Diagnosis not present

## 2021-09-13 DIAGNOSIS — Z419 Encounter for procedure for purposes other than remedying health state, unspecified: Secondary | ICD-10-CM | POA: Diagnosis not present

## 2021-09-13 DIAGNOSIS — G4733 Obstructive sleep apnea (adult) (pediatric): Secondary | ICD-10-CM | POA: Diagnosis not present

## 2021-09-18 ENCOUNTER — Ambulatory Visit (INDEPENDENT_AMBULATORY_CARE_PROVIDER_SITE_OTHER): Payer: Medicaid Other | Admitting: Primary Care

## 2021-09-24 DIAGNOSIS — J45901 Unspecified asthma with (acute) exacerbation: Secondary | ICD-10-CM | POA: Diagnosis not present

## 2021-09-24 DIAGNOSIS — D649 Anemia, unspecified: Secondary | ICD-10-CM | POA: Diagnosis not present

## 2021-09-24 DIAGNOSIS — E662 Morbid (severe) obesity with alveolar hypoventilation: Secondary | ICD-10-CM | POA: Diagnosis not present

## 2021-09-24 DIAGNOSIS — J9601 Acute respiratory failure with hypoxia: Secondary | ICD-10-CM | POA: Diagnosis not present

## 2021-09-25 DIAGNOSIS — Z7689 Persons encountering health services in other specified circumstances: Secondary | ICD-10-CM | POA: Diagnosis not present

## 2021-10-11 ENCOUNTER — Encounter (HOSPITAL_COMMUNITY): Payer: Self-pay | Admitting: Emergency Medicine

## 2021-10-11 ENCOUNTER — Emergency Department (HOSPITAL_COMMUNITY): Payer: Medicaid Other

## 2021-10-11 ENCOUNTER — Emergency Department (HOSPITAL_COMMUNITY)
Admission: EM | Admit: 2021-10-11 | Discharge: 2021-10-11 | Disposition: A | Discharge status: Home / Self Care | Payer: Medicaid Other | Attending: Emergency Medicine | Admitting: Emergency Medicine

## 2021-10-11 ENCOUNTER — Other Ambulatory Visit: Payer: Self-pay

## 2021-10-11 DIAGNOSIS — Z794 Long term (current) use of insulin: Secondary | ICD-10-CM | POA: Diagnosis not present

## 2021-10-11 DIAGNOSIS — E119 Type 2 diabetes mellitus without complications: Secondary | ICD-10-CM | POA: Diagnosis not present

## 2021-10-11 DIAGNOSIS — R1030 Lower abdominal pain, unspecified: Secondary | ICD-10-CM

## 2021-10-11 DIAGNOSIS — R1084 Generalized abdominal pain: Secondary | ICD-10-CM | POA: Diagnosis not present

## 2021-10-11 DIAGNOSIS — Z79899 Other long term (current) drug therapy: Secondary | ICD-10-CM | POA: Diagnosis not present

## 2021-10-11 DIAGNOSIS — N3000 Acute cystitis without hematuria: Secondary | ICD-10-CM | POA: Diagnosis not present

## 2021-10-11 DIAGNOSIS — I10 Essential (primary) hypertension: Secondary | ICD-10-CM | POA: Insufficient documentation

## 2021-10-11 DIAGNOSIS — R739 Hyperglycemia, unspecified: Secondary | ICD-10-CM | POA: Diagnosis not present

## 2021-10-11 DIAGNOSIS — R1032 Left lower quadrant pain: Secondary | ICD-10-CM | POA: Diagnosis not present

## 2021-10-11 DIAGNOSIS — Z7951 Long term (current) use of inhaled steroids: Secondary | ICD-10-CM | POA: Diagnosis not present

## 2021-10-11 DIAGNOSIS — Z743 Need for continuous supervision: Secondary | ICD-10-CM | POA: Diagnosis not present

## 2021-10-11 DIAGNOSIS — Z7984 Long term (current) use of oral hypoglycemic drugs: Secondary | ICD-10-CM | POA: Diagnosis not present

## 2021-10-11 DIAGNOSIS — J45909 Unspecified asthma, uncomplicated: Secondary | ICD-10-CM | POA: Insufficient documentation

## 2021-10-11 LAB — I-STAT BETA HCG BLOOD, ED (MC, WL, AP ONLY): I-stat hCG, quantitative: <5 m[IU]/mL (ref <5)

## 2021-10-11 LAB — URINALYSIS, ROUTINE W REFLEX MICROSCOPIC
Glucose, UA: NEGATIVE mg/dL
Ketones, ur: NEGATIVE mg/dL
Nitrite: POSITIVE — AB
Protein, ur: >300 mg/dL — AB
Specific Gravity, Urine: >1.03 — ABNORMAL HIGH (ref 1.005–1.030)
pH: 5 (ref 5.0–8.0)

## 2021-10-11 LAB — LIPASE, BLOOD: Lipase: 34 U/L (ref 11–51)

## 2021-10-11 LAB — CBC
HCT: 35.5 % — ABNORMAL LOW (ref 36.0–46.0)
Hemoglobin: 10.9 g/dL — ABNORMAL LOW (ref 12.0–15.0)
MCH: 28.3 pg (ref 26.0–34.0)
MCHC: 30.7 g/dL (ref 30.0–36.0)
MCV: 92.2 fL (ref 80.0–100.0)
Platelets: 216 10*3/uL (ref 150–400)
RBC: 3.85 MIL/uL — ABNORMAL LOW (ref 3.87–5.11)
RDW: 13.9 % (ref 11.5–15.5)
WBC: 4.7 10*3/uL (ref 4.0–10.5)
nRBC: 0 % (ref 0.0–0.2)

## 2021-10-11 LAB — COMPREHENSIVE METABOLIC PANEL
ALT: 45 U/L — ABNORMAL HIGH (ref 0–44)
AST: 36 U/L (ref 15–41)
Albumin: 3.1 g/dL — ABNORMAL LOW (ref 3.5–5.0)
Alkaline Phosphatase: 68 U/L (ref 38–126)
Anion gap: 8 (ref 5–15)
BUN: 5 mg/dL — ABNORMAL LOW (ref 6–20)
CO2: 28 mmol/L (ref 22–32)
Calcium: 8.6 mg/dL — ABNORMAL LOW (ref 8.9–10.3)
Chloride: 100 mmol/L (ref 98–111)
Creatinine, Ser: 0.61 mg/dL (ref 0.44–1.00)
GFR, Estimated: >60 mL/min (ref >60)
Glucose, Bld: 228 mg/dL — ABNORMAL HIGH (ref 70–99)
Potassium: 3.5 mmol/L (ref 3.5–5.1)
Sodium: 136 mmol/L (ref 135–145)
Total Bilirubin: 0.3 mg/dL (ref 0.3–1.2)
Total Protein: 6.8 g/dL (ref 6.5–8.1)

## 2021-10-11 LAB — URINALYSIS, MICROSCOPIC (REFLEX)
Bacteria, UA: NONE SEEN
RBC / HPF: >50 RBC/hpf (ref 0–5)
WBC, UA: >50 WBC/hpf (ref 0–5)

## 2021-10-11 MED ORDER — CEPHALEXIN 500 MG PO CAPS: 500.0000 mg | ORAL_CAPSULE | Freq: Four times a day (QID) | ORAL | 0 refills | Status: AC

## 2021-10-11 MED ORDER — SODIUM CHLORIDE 0.9 % IV SOLN
1.0000 g | Freq: Once | INTRAVENOUS | Status: AC
  Administered 2021-10-11: 1 g via INTRAVENOUS
  Filled 2021-10-11: qty 10

## 2021-10-11 MED ORDER — IOHEXOL 300 MG/ML  SOLN
100.0000 mL | Freq: Once | INTRAMUSCULAR | Status: AC | PRN
  Administered 2021-10-11: 100 mL via INTRAVENOUS

## 2021-10-11 NOTE — ED Notes (Signed)
This nurse assumes care of patient. Patient is resting in bed using cell phone. Patient reports she is currently on her period and has experienced lower abdominal cramping for three days. Patient reports she was on metformin for prediabetes but was taken off d/t a rash, over a month ago. Patient is alert and oriented and calm. Call bell in reach. ?

## 2021-10-11 NOTE — ED Notes (Signed)
Patient refuses vaginal swabs, provider aware. ?

## 2021-10-11 NOTE — ED Triage Notes (Addendum)
Pt to triage via [facility] from home.  C/o groin and lower abd pain/pressure x 3 days.  Menstrual cycle heavier than normal and frequent urination with a foul smell.  Also reports nausea, vomiting, and diarrhea. ? ?CBG 252 ?

## 2021-10-11 NOTE — Discharge Instructions (Addendum)
You have been evaluated for lower abdominal pain. ? ?Your work-up is concerning for possible urinary tract infection.  Your CT abdomen pelvis did not show acute finding that would require acute intervention at this time. ? ?Please follow-up with your primary care provider in 1 to 2 days for reevaluation.  Return to the emergency department for fever, worsening symptoms, severe abdominal pain or if you need emergent evaluation. ?

## 2021-10-11 NOTE — ED Provider Notes (Signed)
?[place] ?Provider Note ? ? ?CSN: [id number] ?Arrival date & time: 10/11/21  1803 ? ?  ? ?History ? ?Chief Complaint  ?Patient presents with  ? Abdominal Pain  ? ? ?Yvette Gibbs is a 31 y.o. female. ? ? ?Abdominal Pain ? ?Patient is a 31 year old female with multiple medical history including asthma, hypertension, type 2 diabetes, obesity, NAFLD who presents to the emergency department for 3 days of intermittent lower abdominal pain.  She describes the pain as cramping and radiating to her lower back.  Denies any injury to her abdomen.  She is currently on her period and reports mild increase in vaginal discharge.  She describes her discharge is clear.  Denies associated dysuria or frequency.  She denies any similar symptoms in the past.  No fever.  She did have 1 episode of emesis and one episode of diarrhea initially however it has resolved since.  Denies chest pain or shortness of breath.  She reports being sexually active with 1 partner.  Denies excessive EtOH use or tobacco use. Otherwise no other complaints. ? ? ?Home Medications ?Prior to Admission medications   ?Medication Sig Start Date End Date Taking? Authorizing Provider  ?cephALEXin (KEFLEX) 500 MG capsule Take 1 capsule (500 mg total) by mouth 4 (four) times daily for 5 days. 10/11/21 10/16/21 Yes Donnamarie Poag, MD  ?Accu-Chek Softclix Lancets lancets Use as instructed 05/07/21   Velna Ochs, MD  ?acetaminophen (TYLENOL) 500 MG tablet Take 2 tablets (1,000 mg total) by mouth every 6 (six) hours as needed for fever, mild pain, moderate pain or headache. 04/30/21   Domenic Moras, PA-C  ?albuterol (VENTOLIN HFA) 108 (90 Base) MCG/ACT inhaler Inhale 1-2 puffs into the lungs every 6 (six) hours as needed for wheezing or shortness of breath. 05/21/21   Kerin Perna, NP  ?amLODipine (NORVASC) 10 MG tablet Take 1 tablet (10 mg total) by mouth daily. 05/08/21 06/07/21  Velna Ochs, MD  ?benzonatate  (TESSALON) 100 MG capsule Take 1 capsule (100 mg total) by mouth every 8 (eight) hours. 04/30/21   Domenic Moras, PA-C  ?famotidine (PEPCID) 20 MG tablet TAKE 1 TABLET BY MOUTH TWICE A DAY 01/20/21   Chesley Mires, MD  ?insulin glargine (LANTUS) 100 UNIT/ML Solostar Pen Inject 45 Units into the skin daily. 05/21/21 06/20/21  Kerin Perna, NP  ?Insulin Pen Needle 32G X 4 MM MISC Use as directed 05/07/21   Velna Ochs, MD  ?metFORMIN (GLUCOPHAGE) 1000 MG tablet Take 1 tablet (1,000 mg total) by mouth 2 (two) times daily with a meal. 05/21/21 06/20/21  Kerin Perna, NP  ?ondansetron (ZOFRAN ODT) 4 MG disintegrating tablet Take 1 tablet (4 mg total) by mouth every 8 (eight) hours as needed for nausea or vomiting. 04/22/21   Blue, Soijett A, PA-C  ?pantoprazole (PROTONIX) 40 MG tablet TAKE 1 TABLET BY MOUTH EVERY DAY 01/20/21   Chesley Mires, MD  ?torsemide (DEMADEX) 20 MG tablet Take 2 tablets (40 mg total) by mouth 2 (two) times daily. 05/07/21 06/06/21  Velna Ochs, MD  ?   ? ?Allergies    ?Bee pollen, Fish allergy, and Lisinopril   ? ?Review of Systems   ?Review of Systems  ?Gastrointestinal:  Positive for abdominal pain.  ? ?Physical Exam ?Updated Vital Signs ?BP (!) 145/91   Pulse 93   Temp 99.6 ?F (37.6 ?C) (Oral)   Resp 17   LMP 10/08/2021   SpO2 98%  ?Physical Exam ?Constitutional:   ?  General: She is not in acute distress. ?   Appearance: She is obese. She is not ill-appearing.  ?HENT:  ?   Head: Normocephalic.  ?Eyes:  ?   Extraocular Movements: Extraocular movements intact.  ?   Pupils: Pupils are equal, round, and reactive to light.  ?Cardiovascular:  ?   Rate and Rhythm: Normal rate.  ?Pulmonary:  ?   Breath sounds: No wheezing.  ?   Comments: Distant lung sounds bilaterally secondary to body habitus ?Abdominal:  ?   Tenderness: There is no abdominal tenderness. There is no guarding or rebound.  ?   Comments: Abdominal exam limited secondary to body habitus  ?Skin: ?   General: Skin is  warm.  ?   Capillary Refill: Capillary refill takes less than 2 seconds.  ?Neurological:  ?   General: No focal deficit present.  ?   Mental Status: She is oriented to person, place, and time.  ?Psychiatric:     ?   Mood and Affect: Mood normal.  ? ? ?ED Results / Procedures / Treatments   ?Labs ?(all labs ordered are listed, but only abnormal results are displayed) ?Labs Reviewed  ?COMPREHENSIVE METABOLIC PANEL - Abnormal; Notable for the following components:  ?    Result Value  ? Glucose, Bld 228 (*)   ? BUN 5 (*)   ? Calcium 8.6 (*)   ? Albumin 3.1 (*)   ? ALT 45 (*)   ? All other components within normal limits  ?CBC - Abnormal; Notable for the following components:  ? RBC 3.85 (*)   ? Hemoglobin 10.9 (*)   ? HCT 35.5 (*)   ? All other components within normal limits  ?URINALYSIS, ROUTINE W REFLEX MICROSCOPIC - Abnormal; Notable for the following components:  ? Color, Urine RED (*)   ? APPearance CLOUDY (*)   ? Specific Gravity, Urine >1.030 (*)   ? Hgb urine dipstick LARGE (*)   ? Bilirubin Urine SMALL (*)   ? Protein, ur >300 (*)   ? Nitrite POSITIVE (*)   ? Leukocytes,Ua TRACE (*)   ? All other components within normal limits  ?WET PREP, GENITAL  ?LIPASE, BLOOD  ?URINALYSIS, MICROSCOPIC (REFLEX)  ?I-STAT BETA HCG BLOOD, ED ([facility], [facility], [facility] ONLY)  ?GC/CHLAMYDIA PROBE AMP ([place]) NOT AT [facility]  ? ? ?EKG ?None ? ?Radiology ?CT ABDOMEN PELVIS W CONTRAST ? ?Addendum Date: 10/11/2021   ?ADDENDUM REPORT: 10/11/2021 21:56 ADDENDUM: Patchy infiltrates at the right lung base, suspicious for pneumonia. Electronically Signed   By: Brett Fairy M.D.   On: 10/11/2021 21:56  ? ?Result Date: 10/11/2021 ?CLINICAL DATA:  Left lower quadrant abdominal pain. EXAM: CT ABDOMEN AND PELVIS WITH CONTRAST TECHNIQUE: Multidetector CT imaging of the abdomen and pelvis was performed using the standard protocol following bolus administration of intravenous contrast. RADIATION DOSE REDUCTION: This exam was performed according to the  departmental dose-optimization program which includes automated exposure control, adjustment of the mA and/or kV according to patient size and/or use of iterative reconstruction technique. CONTRAST:  168mL OMNIPAQUE IOHEXOL 300 MG/ML  SOLN COMPARISON:  01/03/2021. FINDINGS: Lower chest: Patchy infiltrates are present at the right lung base. Hepatobiliary: No focal liver abnormality is seen. No biliary ductal dilatation. The gallbladder is not visualized on exam. Pancreas: Unremarkable. No pancreatic ductal dilatation or surrounding inflammatory changes. Spleen: Normal in size without focal abnormality. Adrenals/Urinary Tract: Adrenal glands are unremarkable. Kidneys are normal, without renal calculi, focal lesion, or hydronephrosis. Bladder is unremarkable. Stomach/Bowel:  Stomach is within normal limits. The appendix is not visualized on exam and surgical changes are noted at the cecum. No evidence of bowel wall thickening, distention, or inflammatory changes. Vascular/Lymphatic: No significant vascular findings are present. No enlarged abdominal or pelvic lymph nodes. Reproductive: Uterus and bilateral adnexa are unremarkable. Other: Small fat containing umbilical hernia.  No ascites. Musculoskeletal: Subcutaneous fat stranding is noted in the left lower quadrant anterior abdominal wall. No abscess or fluid collection is seen. 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 No acute osseous abnormality. IMPRESSION: 1. No acute intra-abdominal process. 2. Mild subcutaneous fat stranding in the anterior abdominal wall on the left. Correlate clinically to exclude infectious or inflammatory process. Electronically Signed: By: Brett Fairy M.D. On: 10/11/2021 21:53   ? ?Procedures ?Procedures  ? ?Medications Ordered in ED ?Medications  ?iohexol (OMNIPAQUE) 300 MG/ML solution 100 mL (100 mLs Intravenous Contrast Given 10/11/21 2121)  ?cefTRIAXone (ROCEPHIN) 1 g in sodium chloride 0.9 % 100 mL IVPB (1 g  Intravenous New Bag/Given 10/11/21 2140)  ? ? ?ED Course/ Medical Decision Making/ A&P ?  ?                        ?Medical Decision Making ?Problems Addressed: ?Acute cystitis without hematuria: acute illness or injury that p

## 2021-10-13 DIAGNOSIS — G4733 Obstructive sleep apnea (adult) (pediatric): Secondary | ICD-10-CM | POA: Diagnosis not present

## 2021-10-13 DIAGNOSIS — Z419 Encounter for procedure for purposes other than remedying health state, unspecified: Secondary | ICD-10-CM | POA: Diagnosis not present

## 2021-10-24 ENCOUNTER — Ambulatory Visit (INDEPENDENT_AMBULATORY_CARE_PROVIDER_SITE_OTHER): Payer: Medicaid Other | Admitting: Primary Care

## 2021-11-13 DIAGNOSIS — Z419 Encounter for procedure for purposes other than remedying health state, unspecified: Secondary | ICD-10-CM | POA: Diagnosis not present

## 2021-12-13 DIAGNOSIS — Z419 Encounter for procedure for purposes other than remedying health state, unspecified: Secondary | ICD-10-CM | POA: Diagnosis not present

## 2022-01-11 IMAGING — DX DG CHEST 1V PORT
1 series · 1 of 1 positions shown · non-contrast
Comparison: 05/21/2020

CLINICAL DATA: Shortness of breath

EXAM:
PORTABLE CHEST 1 VIEW

[chest]
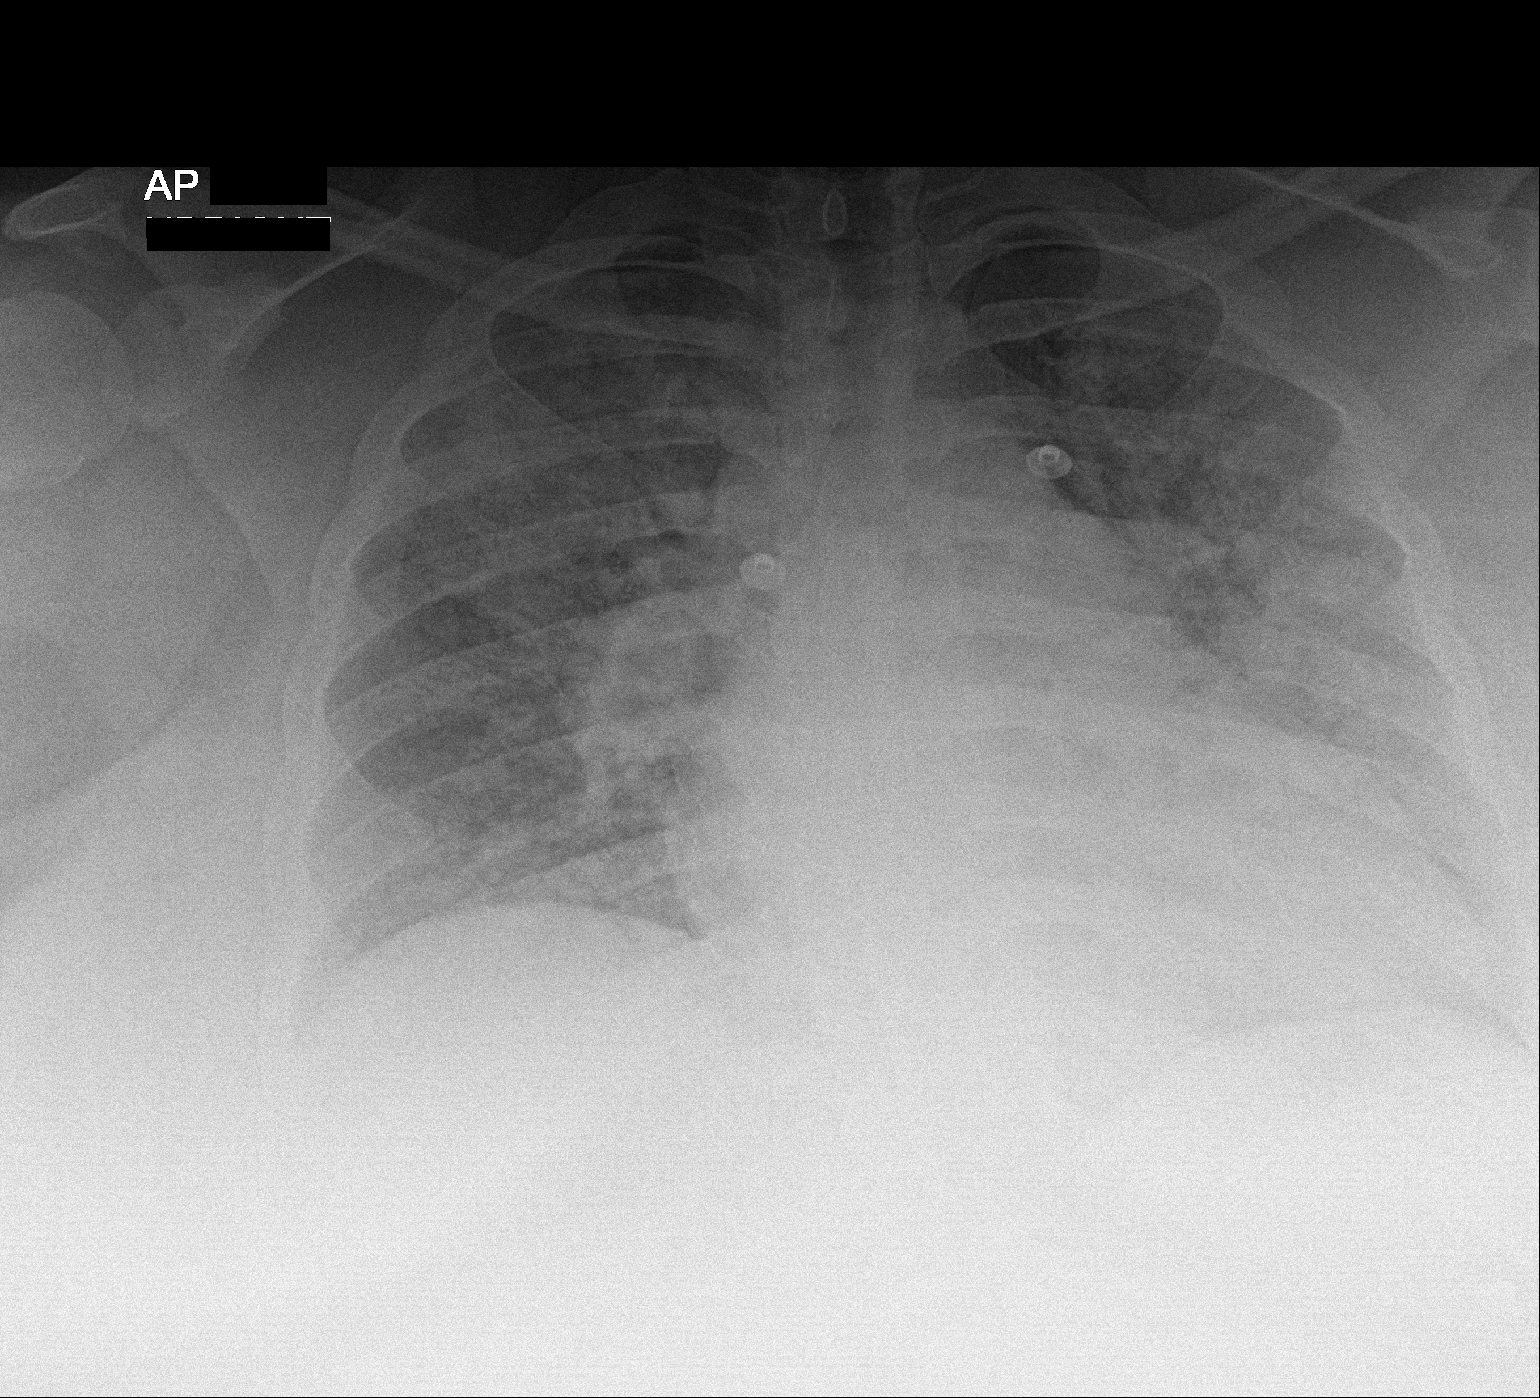

[1 of 1 positions shown; findings below may reference images not displayed]

FINDINGS: Bilateral parahilar opacities with mild cardiomegaly. No
pneumothorax or sizable pleural effusion.
IMPRESSION: Cardiomegaly and bilateral parahilar opacities, likely pulmonary
edema.

## 2022-01-13 DIAGNOSIS — Z419 Encounter for procedure for purposes other than remedying health state, unspecified: Secondary | ICD-10-CM | POA: Diagnosis not present

## 2022-02-06 ENCOUNTER — Other Ambulatory Visit: Payer: Self-pay

## 2022-02-06 ENCOUNTER — Encounter (HOSPITAL_COMMUNITY): Payer: Self-pay | Admitting: Emergency Medicine

## 2022-02-06 ENCOUNTER — Emergency Department (HOSPITAL_COMMUNITY)
Admission: EM | Admit: 2022-02-06 | Discharge: 2022-02-06 | Disposition: A | Discharge status: Home / Self Care | Payer: Medicaid Other | Attending: Emergency Medicine | Admitting: Emergency Medicine

## 2022-02-06 DIAGNOSIS — H1031 Unspecified acute conjunctivitis, right eye: Secondary | ICD-10-CM | POA: Insufficient documentation

## 2022-02-06 DIAGNOSIS — R609 Edema, unspecified: Secondary | ICD-10-CM | POA: Diagnosis not present

## 2022-02-06 DIAGNOSIS — Z743 Need for continuous supervision: Secondary | ICD-10-CM | POA: Diagnosis not present

## 2022-02-06 DIAGNOSIS — H5711 Ocular pain, right eye: Secondary | ICD-10-CM | POA: Diagnosis present

## 2022-02-06 MED ORDER — FLUORESCEIN SODIUM 1 MG OP STRP
1.0000 | ORAL_STRIP | Freq: Once | OPHTHALMIC | Status: AC
  Administered 2022-02-06: 1 via OPHTHALMIC
  Filled 2022-02-06: qty 1

## 2022-02-06 MED ORDER — TETRACAINE HCL 0.5 % OP SOLN
1.0000 [drp] | Freq: Once | OPHTHALMIC | Status: AC
Start: 2022-02-06 — End: 2022-02-06
  Administered 2022-02-06: 1 [drp] via OPHTHALMIC
  Filled 2022-02-06: qty 4

## 2022-02-06 MED ORDER — TOBRAMYCIN 0.3 % OP SOLN: 1.0000 [drp] | OPHTHALMIC | 0 refills | Status: AC

## 2022-02-06 NOTE — ED Provider Notes (Signed)
[facility] EMERGENCY DEPARTMENT Provider Note   CSN: [id number] Arrival date & time: 02/06/22  0935     History  Chief Complaint  Patient presents with   Eye Pain    Yvette Gibbs is a 31 y.o. female.  The history is provided by the patient. No language interpreter was used.  Eye Pain This is a new problem. The current episode started yesterday. The problem occurs constantly. The problem has not changed since onset.Pertinent negatives include no shortness of breath. Nothing aggravates the symptoms. Nothing relieves the symptoms. She has tried nothing for the symptoms. The treatment provided no relief.       Home Medications Prior to Admission medications   Medication Sig Start Date End Date Taking? Authorizing Provider  tobramycin (TOBREX) 0.3 % ophthalmic solution Place 1 drop into the right eye every 4 (four) hours for 10 days. 02/06/22 02/16/22 Yes Elson Areas, PA-C  Accu-Chek Softclix Lancets lancets Use as instructed 05/07/21   Reymundo Poll, MD  acetaminophen (TYLENOL) 500 MG tablet Take 2 tablets (1,000 mg total) by mouth every 6 (six) hours as needed for fever, mild pain, moderate pain or headache. 04/30/21   Fayrene Helper, PA-C  albuterol (VENTOLIN HFA) 108 (90 Base) MCG/ACT inhaler Inhale 1-2 puffs into the lungs every 6 (six) hours as needed for wheezing or shortness of breath. 05/21/21   Grayce Sessions, NP  amLODipine (NORVASC) 10 MG tablet Take 1 tablet (10 mg total) by mouth daily. 05/08/21 06/07/21  Reymundo Poll, MD  benzonatate (TESSALON) 100 MG capsule Take 1 capsule (100 mg total) by mouth every 8 (eight) hours. 04/30/21   Fayrene Helper, PA-C  famotidine (PEPCID) 20 MG tablet TAKE 1 TABLET BY MOUTH TWICE A DAY 01/20/21   Coralyn Helling, MD  insulin glargine (LANTUS) 100 UNIT/ML Solostar Pen Inject 45 Units into the skin daily. 05/21/21 06/20/21  Grayce Sessions, NP  Insulin Pen Needle 32G X 4 MM MISC Use as directed 05/07/21    Reymundo Poll, MD  metFORMIN (GLUCOPHAGE) 1000 MG tablet Take 1 tablet (1,000 mg total) by mouth 2 (two) times daily with a meal. 05/21/21 06/20/21  Grayce Sessions, NP  ondansetron (ZOFRAN ODT) 4 MG disintegrating tablet Take 1 tablet (4 mg total) by mouth every 8 (eight) hours as needed for nausea or vomiting. 04/22/21   Blue, Soijett A, PA-C  pantoprazole (PROTONIX) 40 MG tablet TAKE 1 TABLET BY MOUTH EVERY DAY 01/20/21   Coralyn Helling, MD  torsemide (DEMADEX) 20 MG tablet Take 2 tablets (40 mg total) by mouth 2 (two) times daily. 05/07/21 06/06/21  Reymundo Poll, MD      Allergies    Bee pollen, Fish allergy, and Lisinopril    Review of Systems   Review of Systems  Eyes:  Positive for pain.  Respiratory:  Negative for shortness of breath.   All other systems reviewed and are negative.   Physical Exam Updated Vital Signs BP (!) 167/115 (BP Location: Left Wrist)   Pulse 83   Temp 98.4 F (36.9 C) (Oral)   Resp 20   SpO2 95%  Physical Exam Vitals and nursing note reviewed.  Constitutional:      Appearance: She is well-developed.  HENT:     Head: Normocephalic.     Right Ear: External ear normal.     Left Ear: External ear normal.     Nose: Nose normal.     Mouth/Throat:     Mouth: Mucous membranes are moist.  Eyes:     General:        Right eye: Discharge present.     Pupils: Pupils are equal, round, and reactive to light.     Comments: Injected, erythematous right eye,  tearing   fluro/woods light   no uptake   Pulmonary:     Effort: Pulmonary effort is normal.  Abdominal:     General: There is no distension.  Musculoskeletal:        General: Normal range of motion.     Cervical back: Normal range of motion.  Neurological:     Mental Status: She is alert and oriented to person, place, and time.  Psychiatric:        Mood and Affect: Mood normal.     ED Results / Procedures / Treatments   Labs (all labs ordered are listed, but only abnormal results are  displayed) Labs Reviewed - No data to display  EKG None  Radiology No results found.  Procedures Procedures    Medications Ordered in ED Medications  fluorescein ophthalmic strip 1 strip (has no administration in time range)  tetracaine (PONTOCAINE) 0.5 % ophthalmic solution 1 drop (has no administration in time range)    ED Course/ Medical Decision Making/ A&P                           Medical Decision Making Pt complains of right eye redness and tearing  Amount and/or Complexity of Data Reviewed External Data Reviewed: notes.    Details: [facility] pulmonary notes reviewed   Risk Prescription drug management. Risk Details: Pt counseled on conjunctivitis.  Pt advised to use tobrex.  Return if any problems.     MDM:  I suspect conjunctivitis.          Final Clinical Impression(s) / ED Diagnoses Final diagnoses:  Acute conjunctivitis of right eye, unspecified acute conjunctivitis type    Rx / DC Orders ED Discharge Orders          Ordered    tobramycin (TOBREX) 0.3 % ophthalmic solution  Every 4 hours        02/06/22 1102           An After Visit Summary was printed and given to the patient.    Elson Areas, [place] 02/06/22 1121    Tegeler, Canary Brim, MD 02/06/22 [phone]

## 2022-02-06 NOTE — ED Triage Notes (Signed)
Right eye swelling and pain BIB [facility].  Was here last night and not seen.  Has flushed eye and taken tylenol this morning.  States eye is draining.  VSS  94 HR 97% on RA, RR 16

## 2022-02-06 NOTE — Discharge Instructions (Addendum)
Return if any problems.

## 2022-02-13 DIAGNOSIS — Z419 Encounter for procedure for purposes other than remedying health state, unspecified: Secondary | ICD-10-CM | POA: Diagnosis not present

## 2022-03-15 DIAGNOSIS — Z419 Encounter for procedure for purposes other than remedying health state, unspecified: Secondary | ICD-10-CM | POA: Diagnosis not present

## 2022-03-31 IMAGING — CR DG CHEST 2V
2 series · 2 of 2 positions shown · non-contrast
Comparison: 10/03/2020

CLINICAL DATA: Shortness of breath

EXAM:
CHEST - 2 VIEW

[chest pa]
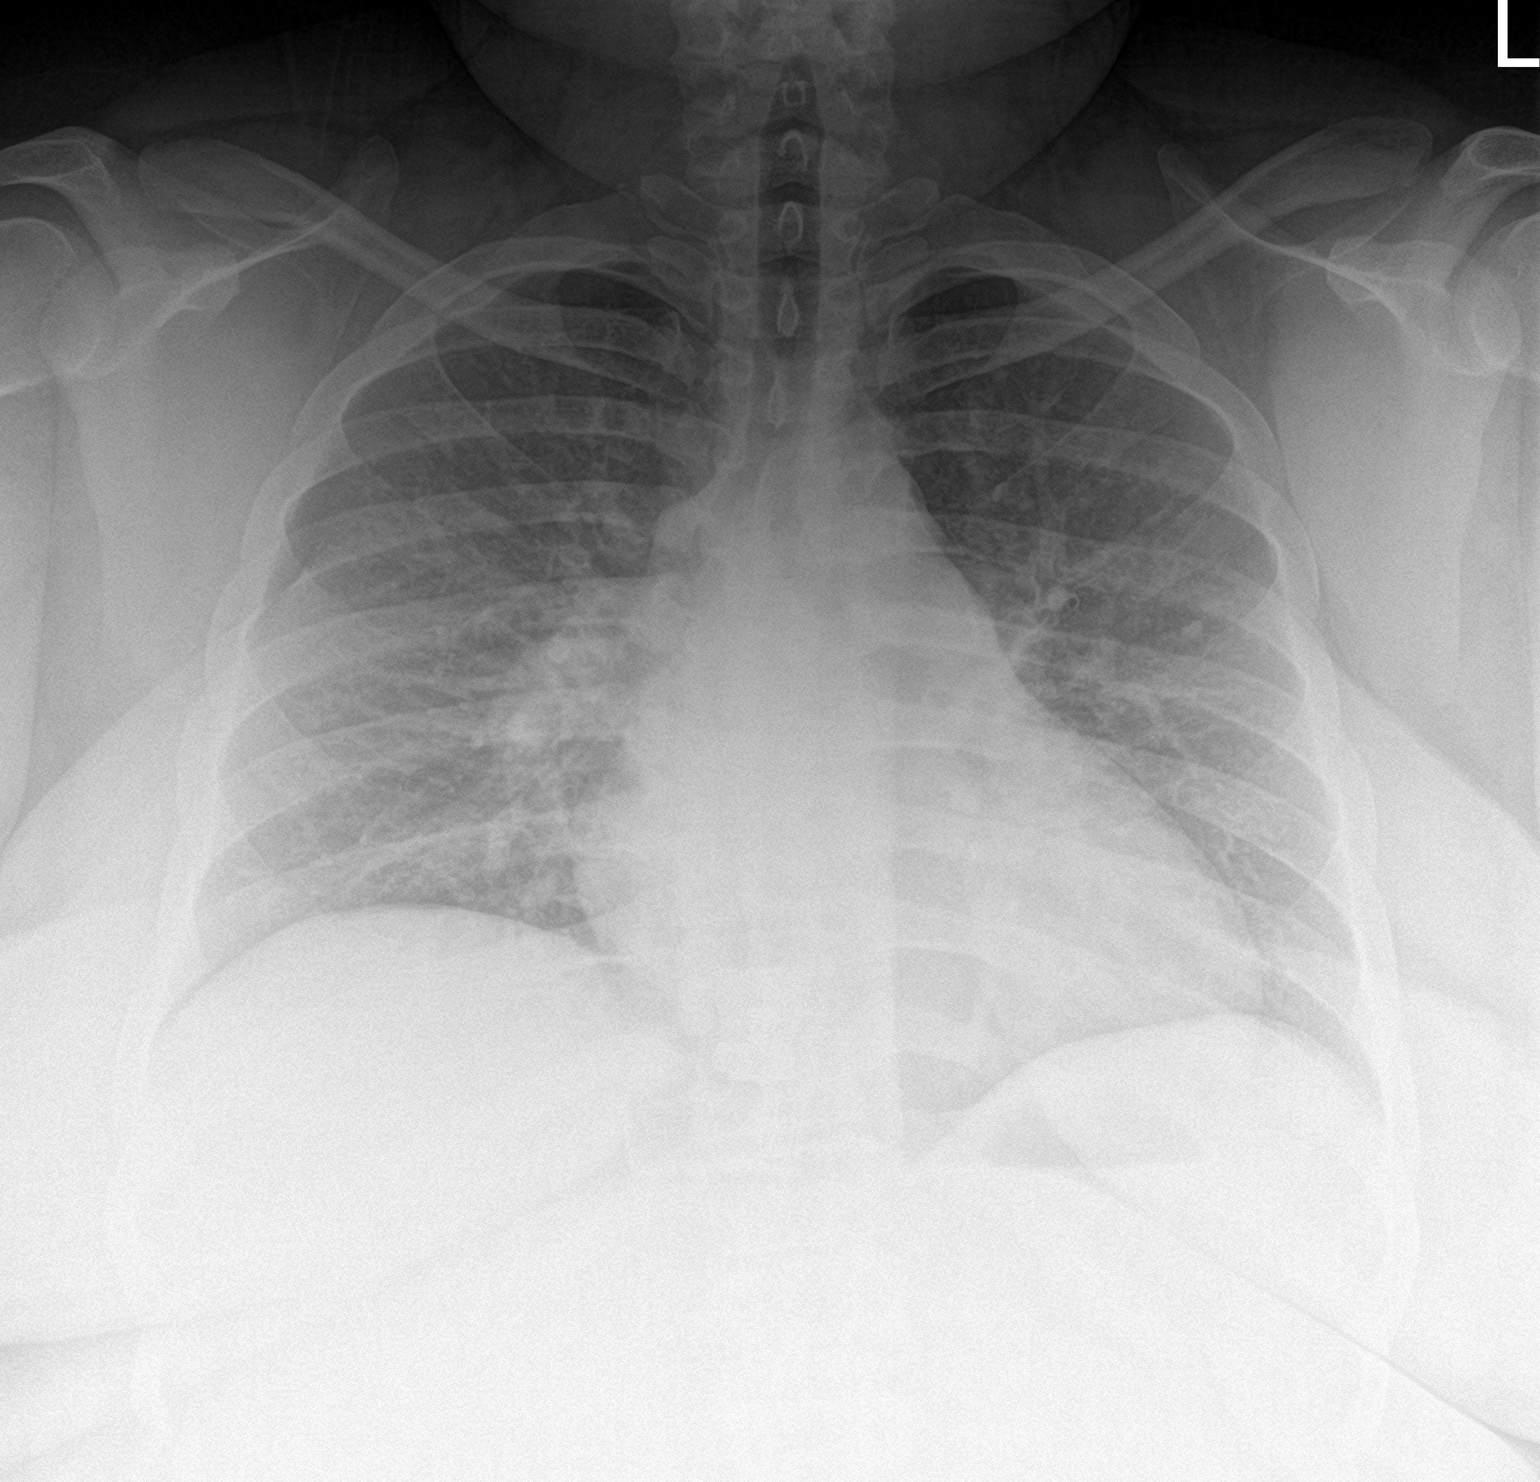

[chest lat]
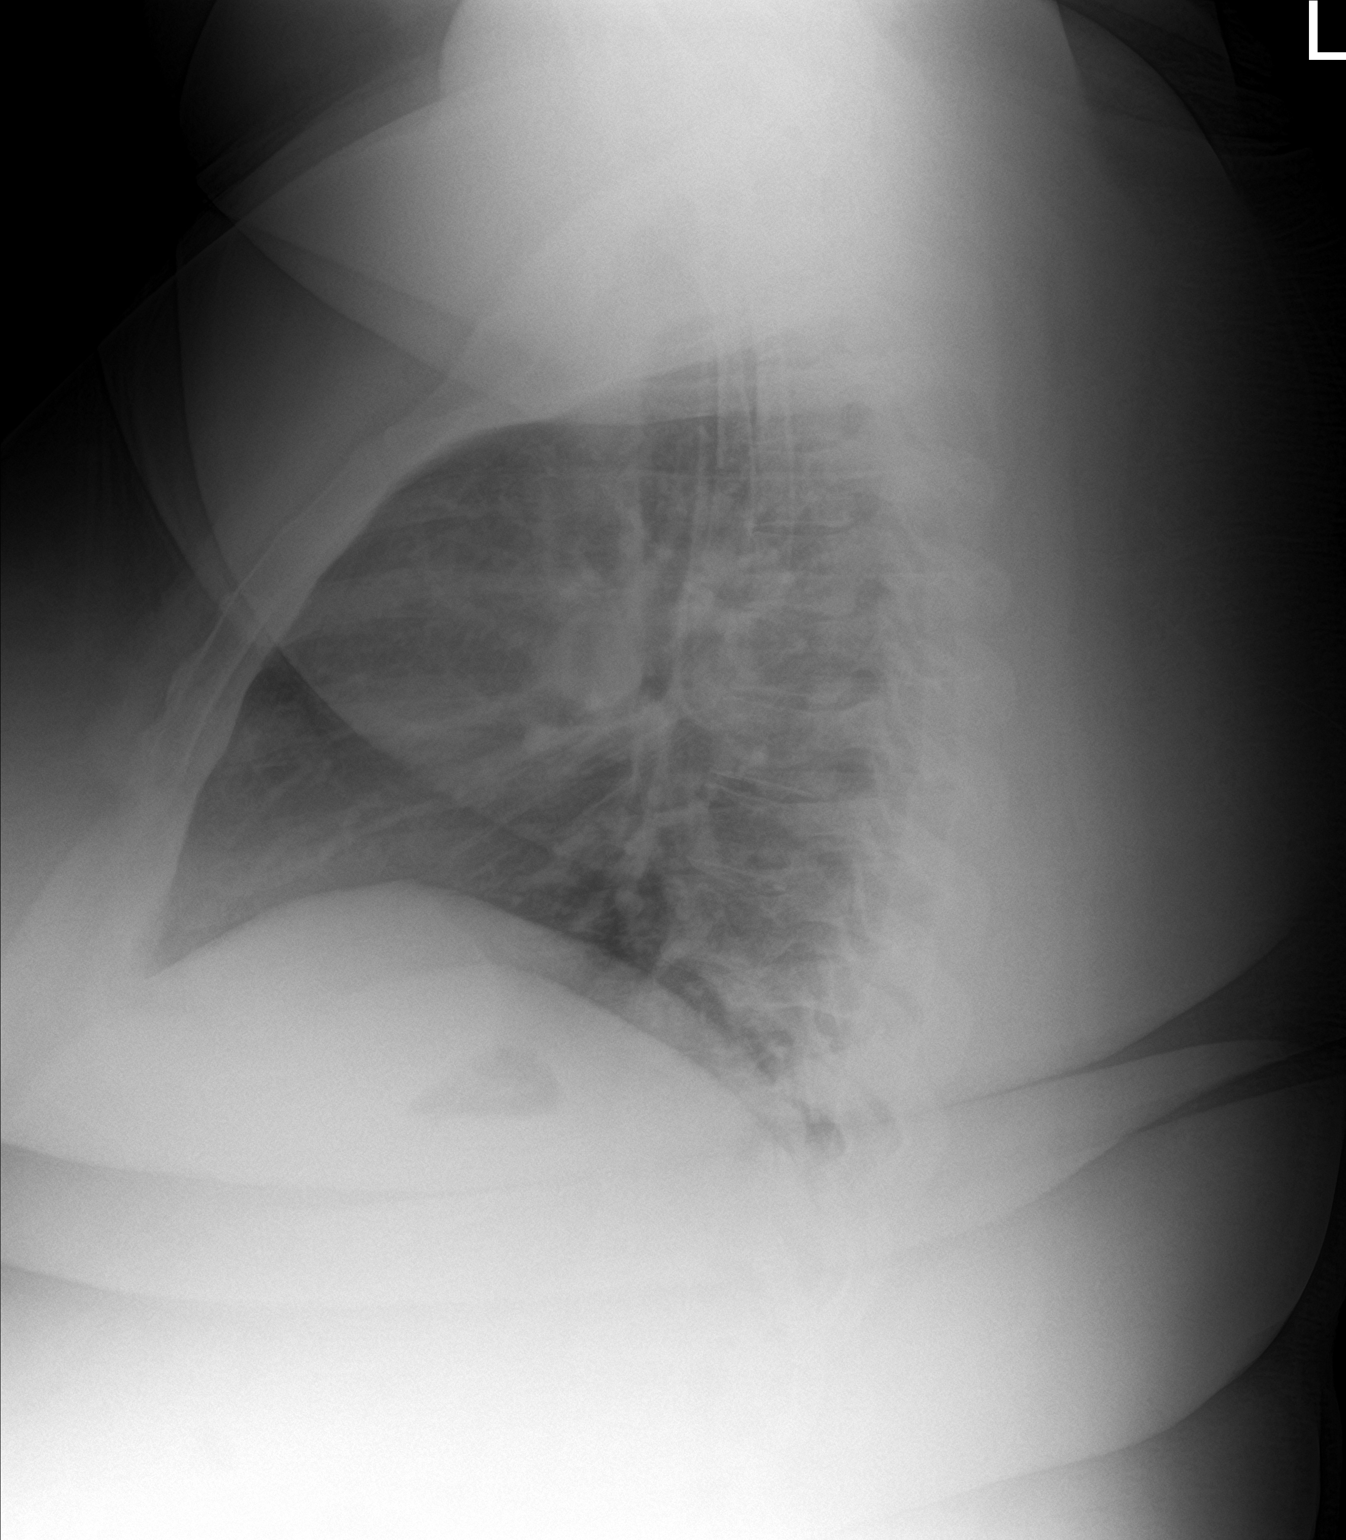

[2 of 2 positions shown; findings below may reference images not displayed]

FINDINGS: Mild cardiomegaly with vascular congestion. No focal opacity or
pleural effusion. No pneumothorax.
IMPRESSION: Mild cardiomegaly with vascular congestion

## 2022-04-15 DIAGNOSIS — Z419 Encounter for procedure for purposes other than remedying health state, unspecified: Secondary | ICD-10-CM | POA: Diagnosis not present

## 2022-04-21 ENCOUNTER — Emergency Department (HOSPITAL_COMMUNITY)
Admission: EM | Admit: 2022-04-21 | Discharge: 2022-04-21 | Discharge status: Against Medical Advice | Payer: Commercial Managed Care - HMO | Attending: Emergency Medicine | Admitting: Emergency Medicine

## 2022-04-21 ENCOUNTER — Other Ambulatory Visit: Payer: Self-pay

## 2022-04-21 ENCOUNTER — Emergency Department (HOSPITAL_COMMUNITY): Payer: Commercial Managed Care - HMO

## 2022-04-21 ENCOUNTER — Encounter (HOSPITAL_COMMUNITY): Payer: Self-pay

## 2022-04-21 DIAGNOSIS — R0789 Other chest pain: Secondary | ICD-10-CM | POA: Diagnosis not present

## 2022-04-21 DIAGNOSIS — R059 Cough, unspecified: Secondary | ICD-10-CM | POA: Diagnosis not present

## 2022-04-21 DIAGNOSIS — R079 Chest pain, unspecified: Secondary | ICD-10-CM | POA: Diagnosis not present

## 2022-04-21 DIAGNOSIS — Z5321 Procedure and treatment not carried out due to patient leaving prior to being seen by health care provider: Secondary | ICD-10-CM | POA: Insufficient documentation

## 2022-04-21 DIAGNOSIS — R071 Chest pain on breathing: Secondary | ICD-10-CM | POA: Diagnosis not present

## 2022-04-21 DIAGNOSIS — R0602 Shortness of breath: Secondary | ICD-10-CM | POA: Diagnosis not present

## 2022-04-21 LAB — BASIC METABOLIC PANEL
Anion gap: 11 (ref 5–15)
BUN: 5 mg/dL — ABNORMAL LOW (ref 6–20)
CO2: 29 mmol/L (ref 22–32)
Calcium: 9.1 mg/dL (ref 8.9–10.3)
Chloride: 99 mmol/L (ref 98–111)
Creatinine, Ser: 0.58 mg/dL (ref 0.44–1.00)
GFR, Estimated: >60 mL/min (ref >60)
Glucose, Bld: 164 mg/dL — ABNORMAL HIGH (ref 70–99)
Potassium: 3.3 mmol/L — ABNORMAL LOW (ref 3.5–5.1)
Sodium: 139 mmol/L (ref 135–145)

## 2022-04-21 LAB — CBC
HCT: 39.3 % (ref 36.0–46.0)
Hemoglobin: 11.1 g/dL — ABNORMAL LOW (ref 12.0–15.0)
MCH: 24.1 pg — ABNORMAL LOW (ref 26.0–34.0)
MCHC: 28.2 g/dL — ABNORMAL LOW (ref 30.0–36.0)
MCV: 85.4 fL (ref 80.0–100.0)
Platelets: 281 10*3/uL (ref 150–400)
RBC: 4.6 MIL/uL (ref 3.87–5.11)
RDW: 16.9 % — ABNORMAL HIGH (ref 11.5–15.5)
WBC: 4.7 10*3/uL (ref 4.0–10.5)
nRBC: 0 % (ref 0.0–0.2)

## 2022-04-21 LAB — I-STAT BETA HCG BLOOD, ED (MC, WL, AP ONLY): I-stat hCG, quantitative: <5 m[IU]/mL (ref <5)

## 2022-04-21 LAB — D-DIMER, QUANTITATIVE: D-Dimer, Quant: <0.27 ug/mL-FEU (ref 0.00–0.50)

## 2022-04-21 LAB — TROPONIN I (HIGH SENSITIVITY): Troponin I (High Sensitivity): 6 ng/L (ref <18)

## 2022-04-21 NOTE — ED Provider Triage Note (Signed)
Emergency Medicine Provider Triage Evaluation Note  Yvette Gibbs , a 31 y.o. female  was evaluated in triage.  Pt complains of chest pain x3 days.  Is worse when she is walking around, also having pain with inspiration and coughing.  Does not feel like her typical asthma.  No oral birth, no recent travel or surgeries..  Review of Systems  Per HPI  Physical Exam  BP (!) 169/110 (BP Location: Right Arm)   Pulse (!) 110   Temp 98.6 F (37 C)   Resp (!) 22   SpO2 100%  Gen:   Awake, no distress   Resp:  Normal effort  MSK:   Moves extremities without difficulty  Other:  Tachycardic   Medical Decision Making  Medically screening exam initiated at 3:14 PM.  Appropriate orders placed.  Yvette Gibbs was informed that the remainder of the evaluation will be completed by another provider, this initial triage assessment does not replace that evaluation, and the importance of remaining in the ED until their evaluation is complete.     Sherrill Raring, PA-C 04/21/22 1514

## 2022-04-21 NOTE — ED Notes (Signed)
Pt states that she is leaving because she has to go to work.

## 2022-04-21 NOTE — ED Triage Notes (Signed)
Patient complains of cough since Friday with bilateral rib pain with cough and inspiration. Alert and oriented. Patient using inhaler with minimal relief. Alert and oriented, NAD

## 2022-05-15 DIAGNOSIS — Z419 Encounter for procedure for purposes other than remedying health state, unspecified: Secondary | ICD-10-CM | POA: Diagnosis not present

## 2022-05-17 ENCOUNTER — Emergency Department (HOSPITAL_COMMUNITY)
Admission: EM | Admit: 2022-05-17 | Discharge: 2022-05-18 | Disposition: A | Discharge status: Home / Self Care | Payer: Medicaid Other | Attending: Emergency Medicine | Admitting: Emergency Medicine

## 2022-05-17 ENCOUNTER — Encounter (HOSPITAL_COMMUNITY): Payer: Self-pay | Admitting: *Deleted

## 2022-05-17 ENCOUNTER — Other Ambulatory Visit: Payer: Self-pay

## 2022-05-17 DIAGNOSIS — R0989 Other specified symptoms and signs involving the circulatory and respiratory systems: Secondary | ICD-10-CM | POA: Diagnosis not present

## 2022-05-17 DIAGNOSIS — Z794 Long term (current) use of insulin: Secondary | ICD-10-CM | POA: Insufficient documentation

## 2022-05-17 DIAGNOSIS — E119 Type 2 diabetes mellitus without complications: Secondary | ICD-10-CM | POA: Diagnosis not present

## 2022-05-17 DIAGNOSIS — Z20822 Contact with and (suspected) exposure to covid-19: Secondary | ICD-10-CM | POA: Diagnosis not present

## 2022-05-17 DIAGNOSIS — Z7984 Long term (current) use of oral hypoglycemic drugs: Secondary | ICD-10-CM | POA: Insufficient documentation

## 2022-05-17 DIAGNOSIS — Z743 Need for continuous supervision: Secondary | ICD-10-CM | POA: Diagnosis not present

## 2022-05-17 DIAGNOSIS — R0981 Nasal congestion: Secondary | ICD-10-CM | POA: Diagnosis not present

## 2022-05-17 DIAGNOSIS — R509 Fever, unspecified: Secondary | ICD-10-CM | POA: Insufficient documentation

## 2022-05-17 DIAGNOSIS — R112 Nausea with vomiting, unspecified: Secondary | ICD-10-CM | POA: Insufficient documentation

## 2022-05-17 DIAGNOSIS — T68XXXA Hypothermia, initial encounter: Secondary | ICD-10-CM | POA: Diagnosis not present

## 2022-05-17 DIAGNOSIS — J111 Influenza due to unidentified influenza virus with other respiratory manifestations: Secondary | ICD-10-CM | POA: Diagnosis not present

## 2022-05-17 DIAGNOSIS — R6889 Other general symptoms and signs: Secondary | ICD-10-CM

## 2022-05-17 LAB — COMPREHENSIVE METABOLIC PANEL
ALT: 24 U/L (ref 0–44)
AST: 25 U/L (ref 15–41)
Albumin: 3.4 g/dL — ABNORMAL LOW (ref 3.5–5.0)
Alkaline Phosphatase: 69 U/L (ref 38–126)
Anion gap: 10 (ref 5–15)
BUN: 5 mg/dL — ABNORMAL LOW (ref 6–20)
CO2: 28 mmol/L (ref 22–32)
Calcium: 9.1 mg/dL (ref 8.9–10.3)
Chloride: 98 mmol/L (ref 98–111)
Creatinine, Ser: 0.6 mg/dL (ref 0.44–1.00)
GFR, Estimated: >60 mL/min (ref >60)
Glucose, Bld: 138 mg/dL — ABNORMAL HIGH (ref 70–99)
Potassium: 3.8 mmol/L (ref 3.5–5.1)
Sodium: 136 mmol/L (ref 135–145)
Total Bilirubin: 0.5 mg/dL (ref 0.3–1.2)
Total Protein: 7.5 g/dL (ref 6.5–8.1)

## 2022-05-17 LAB — CBC WITH DIFFERENTIAL/PLATELET
Abs Immature Granulocytes: 0.01 10*3/uL (ref 0.00–0.07)
Basophils Absolute: 0 10*3/uL (ref 0.0–0.1)
Basophils Relative: 1 %
Eosinophils Absolute: 0.1 10*3/uL (ref 0.0–0.5)
Eosinophils Relative: 2 %
HCT: 40.4 % (ref 36.0–46.0)
Hemoglobin: 12.4 g/dL (ref 12.0–15.0)
Immature Granulocytes: 0 %
Lymphocytes Relative: 19 %
Lymphs Abs: 0.9 10*3/uL (ref 0.7–4.0)
MCH: 25.3 pg — ABNORMAL LOW (ref 26.0–34.0)
MCHC: 30.7 g/dL (ref 30.0–36.0)
MCV: 82.4 fL (ref 80.0–100.0)
Monocytes Absolute: 0.6 10*3/uL (ref 0.1–1.0)
Monocytes Relative: 13 %
Neutro Abs: 3 10*3/uL (ref 1.7–7.7)
Neutrophils Relative %: 65 %
Platelets: 240 10*3/uL (ref 150–400)
RBC: 4.9 MIL/uL (ref 3.87–5.11)
RDW: 15.7 % — ABNORMAL HIGH (ref 11.5–15.5)
WBC: 4.5 10*3/uL (ref 4.0–10.5)
nRBC: 0 % (ref 0.0–0.2)

## 2022-05-17 LAB — LIPASE, BLOOD: Lipase: 31 U/L (ref 11–51)

## 2022-05-17 LAB — RESP PANEL BY RT-PCR (FLU A&B, COVID) ARPGX2
Influenza A by PCR: NEGATIVE
Influenza B by PCR: NEGATIVE
SARS Coronavirus 2 by RT PCR: NEGATIVE

## 2022-05-17 LAB — I-STAT BETA HCG BLOOD, ED (MC, WL, AP ONLY): I-stat hCG, quantitative: <5 m[IU]/mL (ref <5)

## 2022-05-17 MED ORDER — IBUPROFEN 400 MG PO TABS
600.0000 mg | ORAL_TABLET | Freq: Once | ORAL | Status: AC
  Administered 2022-05-17: 600 mg via ORAL
  Filled 2022-05-17: qty 1

## 2022-05-17 NOTE — ED Provider Triage Note (Signed)
Emergency Medicine Provider Triage Evaluation Note  Yvette Gibbs , a 31 y.o. female  was evaluated in triage.  Pt complains of nausea, occasional vomiting and upper abdominal pain over the past 5 days.  She states that she has not had a menstrual period in the past 3 months and took a test today that was positive.  She also reports nasal congestion, runny nose and sore throat.  She has had chills.  No documented fevers.  No vaginal bleeding or discharge.  Review of Systems  Positive: Upper abdominal pain, vomiting Negative: Fever  Physical Exam  BP (!) 177/104 (BP Location: Right Arm)   Pulse (!) 111   Temp 99.2 F (37.3 C) (Oral)   Resp (!) 24   SpO2 95%  Gen:   Awake, no distress   Resp:  Normal effort  MSK:   Moves extremities without difficulty  Other:  Abdomen mild epigastric abdominal tenderness, ENT runny nose and congestion  Medical Decision Making  Medically screening exam initiated at 5:58 PM.  Appropriate orders placed.  Yvette Gibbs was informed that the remainder of the evaluation will be completed by another provider, this initial triage assessment does not replace that evaluation, and the importance of remaining in the ED until their evaluation is complete.     Renne Crigler, PA-C 05/17/22 1759

## 2022-05-17 NOTE — ED Triage Notes (Signed)
The pt has a cold for r5 days and multiple other symptoms she had a pos preg test this am she came in by ems she last had tylenol  1200 today  lmp 3 months ago

## 2022-05-18 MED ORDER — AMOXICILLIN-POT CLAVULANATE 875-125 MG PO TABS: 1.0000 | ORAL_TABLET | Freq: Two times a day (BID) | ORAL | 0 refills | Status: DC

## 2022-05-18 MED ORDER — ONDANSETRON HCL 4 MG PO TABS: 4.0000 mg | ORAL_TABLET | Freq: Four times a day (QID) | ORAL | 0 refills | Status: DC

## 2022-05-18 MED ORDER — ONDANSETRON 4 MG PO TBDP: 4.0000 mg | ORAL_TABLET | Freq: Once | ORAL | Status: DC

## 2022-05-18 NOTE — Discharge Instructions (Signed)
Overall suspect you have viral type process but given that your symptoms have been for the last several days will prescribe antibiotic.  Have prescribed Zofran as needed for nausea and vomiting.

## 2022-05-18 NOTE — ED Provider Notes (Signed)
[facility] EMERGENCY DEPARTMENT Provider Note   CSN: [id number] Arrival date & time: 05/17/22  1752     History  Chief Complaint  Patient presents with   Generalized Body Aches    Yvette Gibbs is a 31 y.o. female.  Patient here with bodyaches and chills for the last couple days.  She thought maybe positive pregnancy test at home recently.  History of diabetes.  She has had some nausea and vomiting.  Denies any vaginal bleeding or discharge.  She has had runny nose and congestion.  Flulike symptoms.  No other sick contacts.  Denies any nausea.  No abdominal pain.  The history is provided by the patient.       Home Medications Prior to Admission medications   Medication Sig Start Date End Date Taking? Authorizing Provider  amoxicillin-clavulanate (AUGMENTIN) 875-125 MG tablet Take 1 tablet by mouth every 12 (twelve) hours. 05/18/22  Yes Tarin Johndrow, DO  ondansetron (ZOFRAN) 4 MG tablet Take 1 tablet (4 mg total) by mouth every 6 (six) hours. 05/18/22  Yes Theo Reither, DO  Accu-Chek Softclix Lancets lancets Use as instructed 05/07/21   Reymundo Poll, MD  acetaminophen (TYLENOL) 500 MG tablet Take 2 tablets (1,000 mg total) by mouth every 6 (six) hours as needed for fever, mild pain, moderate pain or headache. 04/30/21   Fayrene Helper, PA-C  albuterol (VENTOLIN HFA) 108 (90 Base) MCG/ACT inhaler Inhale 1-2 puffs into the lungs every 6 (six) hours as needed for wheezing or shortness of breath. 05/21/21   Grayce Sessions, NP  amLODipine (NORVASC) 10 MG tablet Take 1 tablet (10 mg total) by mouth daily. 05/08/21 06/07/21  Reymundo Poll, MD  benzonatate (TESSALON) 100 MG capsule Take 1 capsule (100 mg total) by mouth every 8 (eight) hours. 04/30/21   Fayrene Helper, PA-C  famotidine (PEPCID) 20 MG tablet TAKE 1 TABLET BY MOUTH TWICE A DAY 01/20/21   Coralyn Helling, MD  insulin glargine (LANTUS) 100 UNIT/ML Solostar Pen Inject 45 Units into the skin daily.  05/21/21 06/20/21  Grayce Sessions, NP  Insulin Pen Needle 32G X 4 MM MISC Use as directed 05/07/21   Reymundo Poll, MD  metFORMIN (GLUCOPHAGE) 1000 MG tablet Take 1 tablet (1,000 mg total) by mouth 2 (two) times daily with a meal. 05/21/21 06/20/21  Grayce Sessions, NP  ondansetron (ZOFRAN ODT) 4 MG disintegrating tablet Take 1 tablet (4 mg total) by mouth every 8 (eight) hours as needed for nausea or vomiting. 04/22/21   Blue, Soijett A, PA-C  pantoprazole (PROTONIX) 40 MG tablet TAKE 1 TABLET BY MOUTH EVERY DAY 01/20/21   Coralyn Helling, MD  torsemide (DEMADEX) 20 MG tablet Take 2 tablets (40 mg total) by mouth 2 (two) times daily. 05/07/21 06/06/21  Reymundo Poll, MD      Allergies    Bee pollen, Fish allergy, and Lisinopril    Review of Systems   Review of Systems  Physical Exam Updated Vital Signs BP (!) 146/102 (BP Location: Right Arm)   Pulse (!) 101   Temp 98.9 F (37.2 C)   Resp 20   Ht 5\' 2"  (1.575 m)   Wt (!) 174 kg   SpO2 92%   BMI 70.16 kg/m  Physical Exam Vitals and nursing note reviewed.  Constitutional:      General: She is not in acute distress.    Appearance: She is well-developed. She is not ill-appearing.  HENT:     Head: Normocephalic and atraumatic.  Nose: Congestion present.     Mouth/Throat:     Mouth: Mucous membranes are moist.  Eyes:     Extraocular Movements: Extraocular movements intact.     Conjunctiva/sclera: Conjunctivae normal.     Pupils: Pupils are equal, round, and reactive to light.  Cardiovascular:     Rate and Rhythm: Normal rate and regular rhythm.     Pulses: Normal pulses.     Heart sounds: Normal heart sounds. No murmur heard. Pulmonary:     Effort: Pulmonary effort is normal. No respiratory distress.     Breath sounds: Normal breath sounds.  Abdominal:     General: Abdomen is flat.     Palpations: Abdomen is soft.     Tenderness: There is no abdominal tenderness.  Musculoskeletal:        General: No swelling.      Cervical back: Normal range of motion and neck supple.  Skin:    General: Skin is warm and dry.     Capillary Refill: Capillary refill takes less than 2 seconds.  Neurological:     Mental Status: She is alert.  Psychiatric:        Mood and Affect: Mood normal.     ED Results / Procedures / Treatments   Labs (all labs ordered are listed, but only abnormal results are displayed) Labs Reviewed  CBC WITH DIFFERENTIAL/PLATELET - Abnormal; Notable for the following components:      Result Value   MCH 25.3 (*)    RDW 15.7 (*)    All other components within normal limits  COMPREHENSIVE METABOLIC PANEL - Abnormal; Notable for the following components:   Glucose, Bld 138 (*)    BUN 5 (*)    Albumin 3.4 (*)    All other components within normal limits  RESP PANEL BY RT-PCR (FLU A&B, COVID) ARPGX2  LIPASE, BLOOD  URINALYSIS, ROUTINE W REFLEX MICROSCOPIC  I-STAT BETA HCG BLOOD, ED ([facility], [facility], [facility] ONLY)    EKG None  Radiology No results found.  Procedures Procedures    Medications Ordered in ED Medications  ondansetron (ZOFRAN-ODT) disintegrating tablet 4 mg (has no administration in time range)  ibuprofen (ADVIL) tablet 600 mg (600 mg Oral Given 05/17/22 2336)    ED Course/ Medical Decision Making/ A&P                           Medical Decision Making Risk Prescription drug management.   Yvette Gibbs is here with flulike symptoms.  She thought maybe she had a positive pregnancy test.  She has had nasal congestion and chills and fevers.  Patient had a fever upon arrival here.  But otherwise normal vitals.  COVID and flu test negative.  No significant anemia or electrolyte abnormality or kidney injury.  She has had no cough or sputum production.  She has had nausea vomiting diarrhea.  Her pregnancy test today is negative.  Overall suspect viral process but symptoms have been ongoing for few days we will treat with antibiotics.  Will provide Zofran for symptomatic control.   Recommend Tylenol and ibuprofen as needed for fever.  She has not had any cough and doubt pneumonia.  No pain with urination and doubt UTI.  With any abdominal pain.  No concern for intra-abdominal infection.  Discharged in good condition.  Understands return precautions.  This chart was dictated using voice recognition software.  Despite best efforts to proofread,  errors can occur which can change  the documentation meaning.         Final Clinical Impression(s) / ED Diagnoses Final diagnoses:  Flu-like symptoms    Rx / DC Orders ED Discharge Orders          Ordered    amoxicillin-clavulanate (AUGMENTIN) 875-125 MG tablet  Every 12 hours        05/18/22 0743    ondansetron (ZOFRAN) 4 MG tablet  Every 6 hours        05/18/22 0743              Virgina Norfolk, DO 05/18/22 0745

## 2022-06-09 ENCOUNTER — Other Ambulatory Visit: Payer: Self-pay

## 2022-06-09 ENCOUNTER — Emergency Department (HOSPITAL_COMMUNITY)
Admission: EM | Admit: 2022-06-09 | Discharge: 2022-06-10 | Disposition: A | Discharge status: Home / Self Care | Payer: Commercial Managed Care - HMO | Attending: Emergency Medicine | Admitting: Emergency Medicine

## 2022-06-09 DIAGNOSIS — R1084 Generalized abdominal pain: Secondary | ICD-10-CM | POA: Diagnosis not present

## 2022-06-09 DIAGNOSIS — J101 Influenza due to other identified influenza virus with other respiratory manifestations: Secondary | ICD-10-CM | POA: Diagnosis not present

## 2022-06-09 DIAGNOSIS — R58 Hemorrhage, not elsewhere classified: Secondary | ICD-10-CM | POA: Diagnosis not present

## 2022-06-09 DIAGNOSIS — R110 Nausea: Secondary | ICD-10-CM | POA: Diagnosis not present

## 2022-06-09 DIAGNOSIS — R109 Unspecified abdominal pain: Secondary | ICD-10-CM | POA: Diagnosis not present

## 2022-06-09 DIAGNOSIS — Z794 Long term (current) use of insulin: Secondary | ICD-10-CM | POA: Insufficient documentation

## 2022-06-09 DIAGNOSIS — R509 Fever, unspecified: Secondary | ICD-10-CM | POA: Diagnosis not present

## 2022-06-09 DIAGNOSIS — R059 Cough, unspecified: Secondary | ICD-10-CM | POA: Diagnosis not present

## 2022-06-09 DIAGNOSIS — Z1152 Encounter for screening for COVID-19: Secondary | ICD-10-CM | POA: Diagnosis not present

## 2022-06-09 DIAGNOSIS — J45909 Unspecified asthma, uncomplicated: Secondary | ICD-10-CM | POA: Diagnosis not present

## 2022-06-09 DIAGNOSIS — R000 Tachycardia, unspecified: Secondary | ICD-10-CM | POA: Diagnosis not present

## 2022-06-09 LAB — CBC WITH DIFFERENTIAL/PLATELET
Abs Immature Granulocytes: 0.03 10*3/uL (ref 0.00–0.07)
Basophils Absolute: 0 10*3/uL (ref 0.0–0.1)
Basophils Relative: 1 %
Eosinophils Absolute: 0.1 10*3/uL (ref 0.0–0.5)
Eosinophils Relative: 2 %
HCT: 41.8 % (ref 36.0–46.0)
Hemoglobin: 12.5 g/dL (ref 12.0–15.0)
Immature Granulocytes: 1 %
Lymphocytes Relative: 18 %
Lymphs Abs: 0.9 10*3/uL (ref 0.7–4.0)
MCH: 25.8 pg — ABNORMAL LOW (ref 26.0–34.0)
MCHC: 29.9 g/dL — ABNORMAL LOW (ref 30.0–36.0)
MCV: 86.4 fL (ref 80.0–100.0)
Monocytes Absolute: 0.6 10*3/uL (ref 0.1–1.0)
Monocytes Relative: 12 %
Neutro Abs: 3.5 10*3/uL (ref 1.7–7.7)
Neutrophils Relative %: 66 %
Platelets: 296 10*3/uL (ref 150–400)
RBC: 4.84 MIL/uL (ref 3.87–5.11)
RDW: 14.7 % (ref 11.5–15.5)
WBC: 5.1 10*3/uL (ref 4.0–10.5)
nRBC: 0 % (ref 0.0–0.2)

## 2022-06-09 LAB — COMPREHENSIVE METABOLIC PANEL
ALT: 17 U/L (ref 0–44)
AST: 15 U/L (ref 15–41)
Albumin: 3 g/dL — ABNORMAL LOW (ref 3.5–5.0)
Alkaline Phosphatase: 72 U/L (ref 38–126)
Anion gap: 7 (ref 5–15)
BUN: 6 mg/dL (ref 6–20)
CO2: 31 mmol/L (ref 22–32)
Calcium: 9.2 mg/dL (ref 8.9–10.3)
Chloride: 99 mmol/L (ref 98–111)
Creatinine, Ser: 0.63 mg/dL (ref 0.44–1.00)
GFR, Estimated: >60 mL/min (ref >60)
Glucose, Bld: 162 mg/dL — ABNORMAL HIGH (ref 70–99)
Potassium: 4.3 mmol/L (ref 3.5–5.1)
Sodium: 137 mmol/L (ref 135–145)
Total Bilirubin: 0.3 mg/dL (ref 0.3–1.2)
Total Protein: 7.5 g/dL (ref 6.5–8.1)

## 2022-06-09 LAB — URINALYSIS, ROUTINE W REFLEX MICROSCOPIC
Bacteria, UA: NONE SEEN
Bilirubin Urine: NEGATIVE
Glucose, UA: NEGATIVE mg/dL
Ketones, ur: NEGATIVE mg/dL
Leukocytes,Ua: NEGATIVE
Nitrite: NEGATIVE
Protein, ur: >=300 mg/dL — AB
RBC / HPF: >50 RBC/hpf — ABNORMAL HIGH (ref 0–5)
Specific Gravity, Urine: 1.019 (ref 1.005–1.030)
pH: 7 (ref 5.0–8.0)

## 2022-06-09 LAB — I-STAT CHEM 8, ED
BUN: 6 mg/dL (ref 6–20)
Calcium, Ion: 1.13 mmol/L — ABNORMAL LOW (ref 1.15–1.40)
Chloride: 94 mmol/L — ABNORMAL LOW (ref 98–111)
Creatinine, Ser: 0.6 mg/dL (ref 0.44–1.00)
Glucose, Bld: 162 mg/dL — ABNORMAL HIGH (ref 70–99)
HCT: 42 % (ref 36.0–46.0)
Hemoglobin: 14.3 g/dL (ref 12.0–15.0)
Potassium: 4.1 mmol/L (ref 3.5–5.1)
Sodium: 136 mmol/L (ref 135–145)
TCO2: 32 mmol/L (ref 22–32)

## 2022-06-09 LAB — I-STAT BETA HCG BLOOD, ED (MC, WL, AP ONLY): I-stat hCG, quantitative: <5 m[IU]/mL (ref <5)

## 2022-06-09 LAB — LIPASE, BLOOD: Lipase: 33 U/L (ref 11–51)

## 2022-06-09 NOTE — ED Triage Notes (Addendum)
Presents via EMS R sided abd pain that started earlier today and feels like a tearing and ripping sensation.  Endorses being [redacted] weeks pregnant and vaginal bleeding that also started today.   H/o appendectomy and cholecystectomy previously.  EMS SBP 150, 112bpm, CBG 206. No meds enroute.

## 2022-06-09 NOTE — ED Provider Triage Note (Signed)
Emergency Medicine Provider Triage Evaluation Note  Yvette Gibbs , a 31 y.o. female  was evaluated in triage.  Pt complains of "ripping" abdominal pain which began last night.  Patient states pain has been worsening since onset.  She currently rates pain at 10 out of 10 in severity.  Pain is mostly in the right lower quadrant but also radiates across the lower abdomen.  Patient with history of previous appendectomy and cholecystectomy.  Patient states had a positive pregnancy test but there was a negative i-STAT beta-hCG within the past few weeks.  She does endorse vaginal bleeding that began today.  Review of Systems  Positive: As above Negative: As above  Physical Exam  There were no vitals taken for this visit. Gen:   Awake, no distress   Resp:  Normal effort  MSK:   Moves extremities without difficulty  Other:  Generalized abdominal tenderness to palpation  Medical Decision Making  Medically screening exam initiated at 9:42 PM.  Appropriate orders placed.  Yvette Gibbs was informed that the remainder of the evaluation will be completed by another provider, this initial triage assessment does not replace that evaluation, and the importance of remaining in the ED until their evaluation is complete.     Yvette Grinder, PA-C 06/09/22 2144

## 2022-06-10 ENCOUNTER — Emergency Department (HOSPITAL_COMMUNITY): Payer: Commercial Managed Care - HMO

## 2022-06-10 DIAGNOSIS — R109 Unspecified abdominal pain: Secondary | ICD-10-CM | POA: Diagnosis not present

## 2022-06-10 DIAGNOSIS — R059 Cough, unspecified: Secondary | ICD-10-CM | POA: Diagnosis not present

## 2022-06-10 DIAGNOSIS — J101 Influenza due to other identified influenza virus with other respiratory manifestations: Secondary | ICD-10-CM | POA: Diagnosis not present

## 2022-06-10 DIAGNOSIS — R509 Fever, unspecified: Secondary | ICD-10-CM | POA: Diagnosis not present

## 2022-06-10 LAB — RESP PANEL BY RT-PCR (RSV, FLU A&B, COVID)  RVPGX2
Influenza A by PCR: NEGATIVE
Influenza B by PCR: POSITIVE — AB
Resp Syncytial Virus by PCR: NEGATIVE
SARS Coronavirus 2 by RT PCR: NEGATIVE

## 2022-06-10 MED ORDER — ALBUTEROL SULFATE (2.5 MG/3ML) 0.083% IN NEBU
2.5000 mg | INHALATION_SOLUTION | Freq: Once | RESPIRATORY_TRACT | Status: AC
  Administered 2022-06-10: 2.5 mg via RESPIRATORY_TRACT
  Filled 2022-06-10: qty 3

## 2022-06-10 MED ORDER — ACETAMINOPHEN 500 MG PO TABS: 500.0000 mg | ORAL_TABLET | Freq: Four times a day (QID) | ORAL | 0 refills | Status: DC | PRN

## 2022-06-10 MED ORDER — IBUPROFEN 600 MG PO TABS: 600.0000 mg | ORAL_TABLET | Freq: Four times a day (QID) | ORAL | 0 refills | Status: DC | PRN

## 2022-06-10 MED ORDER — IOHEXOL 350 MG/ML SOLN
100.0000 mL | Freq: Once | INTRAVENOUS | Status: AC | PRN
  Administered 2022-06-10: 100 mL via INTRAVENOUS

## 2022-06-10 MED ORDER — ONDANSETRON HCL 8 MG PO TABS: 8.0000 mg | ORAL_TABLET | Freq: Three times a day (TID) | ORAL | 0 refills | Status: DC | PRN

## 2022-06-10 MED ORDER — ACETAMINOPHEN 500 MG PO TABS
1000.0000 mg | ORAL_TABLET | Freq: Once | ORAL | Status: AC
  Administered 2022-06-10: 1000 mg via ORAL
  Filled 2022-06-10: qty 2

## 2022-06-10 MED ORDER — IBUPROFEN 800 MG PO TABS
800.0000 mg | ORAL_TABLET | Freq: Once | ORAL | Status: AC
  Administered 2022-06-10: 800 mg via ORAL
  Filled 2022-06-10: qty 1

## 2022-06-10 MED ORDER — OXYCODONE-ACETAMINOPHEN 5-325 MG PO TABS
2.0000 | ORAL_TABLET | Freq: Once | ORAL | Status: AC
  Administered 2022-06-10: 2 via ORAL
  Filled 2022-06-10: qty 2

## 2022-06-10 MED ORDER — LACTATED RINGERS IV BOLUS
1000.0000 mL | Freq: Once | INTRAVENOUS | Status: AC
  Administered 2022-06-10: 1000 mL via INTRAVENOUS

## 2022-06-10 NOTE — ED Notes (Signed)
Patient placed on 2L nasal cannula per RN.

## 2022-06-10 NOTE — ED Provider Notes (Signed)
[facility] EMERGENCY DEPARTMENT Provider Note   CSN: [id number] Arrival date & time: 06/09/22  2124     History  Chief Complaint  Patient presents with   Abdominal Pain    Yvette Gibbs is a 31 y.o. female.  HPI 31 yo female with 2 day ho sore throat, rhinnorhea, nausea vomiting, diarrhea fever, chills, body aches  HO asthma, not pregnant, currently menstruating     Home Medications Prior to Admission medications   Medication Sig Start Date End Date Taking? Authorizing Provider  acetaminophen (TYLENOL) 500 MG tablet Take 2 tablets (1,000 mg total) by mouth every 6 (six) hours as needed for fever, mild pain, moderate pain or headache. 04/30/21  Yes Fayrene Helper, PA-C  albuterol (VENTOLIN HFA) 108 (90 Base) MCG/ACT inhaler Inhale 1-2 puffs into the lungs every 6 (six) hours as needed for wheezing or shortness of breath. 05/21/21  Yes Grayce Sessions, NP  Accu-Chek Softclix Lancets lancets Use as instructed 05/07/21   Reymundo Poll, MD  amLODipine (NORVASC) 10 MG tablet Take 1 tablet (10 mg total) by mouth daily. Patient not taking: Reported on 06/10/2022 05/08/21 06/07/21  Reymundo Poll, MD  amoxicillin-clavulanate (AUGMENTIN) 875-125 MG tablet Take 1 tablet by mouth every 12 (twelve) hours. Patient not taking: Reported on 06/10/2022 05/18/22   Virgina Norfolk, DO  benzonatate (TESSALON) 100 MG capsule Take 1 capsule (100 mg total) by mouth every 8 (eight) hours. Patient not taking: Reported on 06/10/2022 04/30/21   Fayrene Helper, PA-C  insulin glargine (LANTUS) 100 UNIT/ML Solostar Pen Inject 45 Units into the skin daily. Patient not taking: Reported on 06/10/2022 05/21/21 06/20/21  Grayce Sessions, NP  Insulin Pen Needle 32G X 4 MM MISC Use as directed 05/07/21   Reymundo Poll, MD  metFORMIN (GLUCOPHAGE) 1000 MG tablet Take 1 tablet (1,000 mg total) by mouth 2 (two) times daily with a meal. Patient not taking: Reported on 06/10/2022 05/21/21  06/20/21  Grayce Sessions, NP  ondansetron (ZOFRAN ODT) 4 MG disintegrating tablet Take 1 tablet (4 mg total) by mouth every 8 (eight) hours as needed for nausea or vomiting. Patient not taking: Reported on 06/10/2022 04/22/21   Blue, Soijett A, PA-C  ondansetron (ZOFRAN) 8 MG tablet Take 1 tablet (8 mg total) by mouth every 8 (eight) hours as needed for nausea or vomiting. 06/10/22  Yes Margarita Grizzle, MD  pantoprazole (PROTONIX) 40 MG tablet TAKE 1 TABLET BY MOUTH EVERY DAY Patient not taking: Reported on 06/10/2022 01/20/21   Coralyn Helling, MD  torsemide (DEMADEX) 20 MG tablet Take 2 tablets (40 mg total) by mouth 2 (two) times daily. Patient not taking: Reported on 06/10/2022 05/07/21 06/06/21  Reymundo Poll, MD      Allergies    Bee pollen, Fish allergy, Lisinopril, and Metformin and related    Review of Systems   Review of Systems  Physical Exam Updated Vital Signs BP 131/77   Pulse (!) 116   Temp (!) 102.3 F (39.1 C) (Oral)   Resp (!) 21   Wt (!) 174 kg   SpO2 92%   BMI 70.16 kg/m  Physical Exam Vitals reviewed.  Constitutional:      Appearance: She is well-developed. She is obese.  HENT:     Head: Normocephalic.     Mouth/Throat:     Mouth: Mucous membranes are moist.  Eyes:     Extraocular Movements: Extraocular movements intact.  Cardiovascular:     Rate and Rhythm: Tachycardia present.  Neurological:  Mental Status: She is alert.     ED Results / Procedures / Treatments   Labs (all labs ordered are listed, but only abnormal results are displayed) Labs Reviewed  RESP PANEL BY RT-PCR (RSV, FLU A&B, COVID)  RVPGX2 - Abnormal; Notable for the following components:      Result Value   Influenza B by PCR POSITIVE (*)    All other components within normal limits  CBC WITH DIFFERENTIAL/PLATELET - Abnormal; Notable for the following components:   MCH 25.8 (*)    MCHC 29.9 (*)    All other components within normal limits  COMPREHENSIVE METABOLIC PANEL -  Abnormal; Notable for the following components:   Glucose, Bld 162 (*)    Albumin 3.0 (*)    All other components within normal limits  URINALYSIS, ROUTINE W REFLEX MICROSCOPIC - Abnormal; Notable for the following components:   APPearance CLOUDY (*)    Hgb urine dipstick LARGE (*)    Protein, ur >=300 (*)    RBC / HPF >50 (*)    All other components within normal limits  I-STAT CHEM 8, ED - Abnormal; Notable for the following components:   Chloride 94 (*)    Glucose, Bld 162 (*)    Calcium, Ion 1.13 (*)    All other components within normal limits  LIPASE, BLOOD  I-STAT BETA HCG BLOOD, ED ([facility], [facility], [facility] ONLY)    EKG None  Radiology DG Chest Port 1 View  Result Date: 06/10/2022 CLINICAL DATA:  Cough.  Fever. EXAM: PORTABLE CHEST 1 VIEW COMPARISON:  CXR 04/21/22 FINDINGS: No pleural effusion. No pneumothorax. Low lung volumes. Likely unchanged cardiac and mediastinal contours when accounting for differences in volume and positioning. Compared to prior exam there is a new focal pulmonary opacity in the peripheral right mid lung. No displaced rib fractures. The upper abdomen is poorly visualized. IMPRESSION: New focal pulmonary opacity in the peripheral right mid lung, suspicious for pneumonia. Recommend follow-up chest radiograph in 4-6 weeks to ensure resolution. Electronically Signed   By: Lorenza Cambridge M.D.   On: 06/10/2022 11:31   CT ABDOMEN PELVIS W CONTRAST  Result Date: 06/10/2022 CLINICAL DATA:  Abdominal pain. EXAM: CT ABDOMEN AND PELVIS WITH CONTRAST TECHNIQUE: Multidetector CT imaging of the abdomen and pelvis was performed using the standard protocol following bolus administration of intravenous contrast. RADIATION DOSE REDUCTION: This exam was performed according to the departmental dose-optimization program which includes automated exposure control, adjustment of the mA and/or kV according to patient size and/or use of iterative reconstruction technique. CONTRAST:   OMNIPAQUE IOHEXOL 350 MG/ML SOLN COMPARISON:  CT abdomen pelvis dated 10/11/2021. FINDINGS: Lower chest: A small cluster of nodular density in the right lower lobe may represent post inflammatory changes or recurrent infiltrate. Follow-up recommended. The left lung base is clear. No intra-abdominal free air or free fluid. Hepatobiliary: Fatty liver. No biliary dilatation. Cholecystectomy. Pancreas: Unremarkable. No pancreatic ductal dilatation or surrounding inflammatory changes. Spleen: Normal in size without focal abnormality. Adrenals/Urinary Tract: The adrenal glands are unremarkable. The kidneys, visualized ureters appear unremarkable. The urinary bladder is collapsed. Stomach/Bowel: There is no bowel obstruction or active inflammation. Appendectomy. Vascular/Lymphatic: The abdominal aorta and IVC unremarkable. No portal venous gas. There is no adenopathy. Reproductive: The uterus and ovaries are grossly unremarkable. No pelvic mass. Other: None Musculoskeletal: Bilateral L5 pars defects. No listhesis. No acute osseous pathology. IMPRESSION: 1. No acute intra-abdominal or pelvic pathology. 2. Fatty liver. 3. Cluster of nodular density in the right  lower lobe may represent post inflammatory changes or recurrent infiltrate. Follow-up recommended. Electronically Signed   By: Elgie Collard M.D.   On: 06/10/2022 02:32    Procedures Procedures    Medications Ordered in ED Medications  ibuprofen (ADVIL) tablet 800 mg (has no administration in time range)  acetaminophen (TYLENOL) tablet 1,000 mg (1,000 mg Oral Given 06/10/22 0158)  iohexol (OMNIPAQUE) 350 MG/ML injection 100 mL (100 mLs Intravenous Contrast Given 06/10/22 0222)  lactated ringers bolus 1,000 mL (1,000 mLs Intravenous New Bag/Given 06/10/22 1221)  albuterol (PROVENTIL) (2.5 MG/3ML) 0.083% nebulizer solution 2.5 mg (2.5 mg Nebulization Given 06/10/22 1227)  oxyCODONE-acetaminophen (PERCOCET/ROXICET) 5-325 MG per tablet 2 tablet (2 tablets  Oral Given 06/10/22 1221)    ED Course/ Medical Decision Making/ A&P Clinical Course as of 06/10/22 1449  Wed Jun 10, 2022  1445 Respiratory panel reviewed and interpreted and influenza B positive [DR]    Clinical Course User Index [DR] Margarita Grizzle, MD                           Medical Decision Making 31 year old female presents today complaining of nasal congestion, sore throat, body aches, nausea, vomiting, diarrhea. Patient was initially tachycardic but has been significantly febrile.  She does defervesce with antipyretics. Labs were obtained with some hyperglycemia but otherwise essentially within normal limits Urinalysis has some red blood cells in it but patient is currently menstruating Chest x-Nyx [place] is clear Flu B is positive Patient last vital signs show tachycardia of 116 consistent with her temperature of 102.3 with stable blood pressure of 131/77 Patient is not wheezing Oxygen saturations are 93% Patient appears stable for discharge home  Amount and/or Complexity of Data Reviewed Radiology: ordered.  Risk Prescription drug management.           Final Clinical Impression(s) / ED Diagnoses Final diagnoses:  Influenza B    Rx / DC Orders ED Discharge Orders          Ordered    ondansetron (ZOFRAN) 8 MG tablet  Every 8 hours PRN        06/10/22 1449              Margarita Grizzle, MD 06/10/22 1449

## 2022-06-15 DIAGNOSIS — Z419 Encounter for procedure for purposes other than remedying health state, unspecified: Secondary | ICD-10-CM | POA: Diagnosis not present

## 2022-06-20 DIAGNOSIS — F321 Major depressive disorder, single episode, moderate: Secondary | ICD-10-CM | POA: Diagnosis not present

## 2022-07-01 ENCOUNTER — Other Ambulatory Visit: Payer: Self-pay | Admitting: Pharmacist

## 2022-07-01 ENCOUNTER — Other Ambulatory Visit (INDEPENDENT_AMBULATORY_CARE_PROVIDER_SITE_OTHER): Payer: Self-pay | Admitting: Primary Care

## 2022-07-01 ENCOUNTER — Encounter (INDEPENDENT_AMBULATORY_CARE_PROVIDER_SITE_OTHER): Payer: Self-pay | Admitting: Primary Care

## 2022-07-01 ENCOUNTER — Ambulatory Visit (INDEPENDENT_AMBULATORY_CARE_PROVIDER_SITE_OTHER): Payer: Medicaid Other | Admitting: Primary Care

## 2022-07-01 VITALS — BP 148/98 | HR 88 | Resp 16 | Ht 62.0 in | Wt 361.8 lb

## 2022-07-01 DIAGNOSIS — Z1159 Encounter for screening for other viral diseases: Secondary | ICD-10-CM | POA: Diagnosis not present

## 2022-07-01 DIAGNOSIS — E119 Type 2 diabetes mellitus without complications: Secondary | ICD-10-CM

## 2022-07-01 DIAGNOSIS — R519 Headache, unspecified: Secondary | ICD-10-CM

## 2022-07-01 DIAGNOSIS — J4520 Mild intermittent asthma, uncomplicated: Secondary | ICD-10-CM | POA: Diagnosis not present

## 2022-07-01 DIAGNOSIS — Z7689 Persons encountering health services in other specified circumstances: Secondary | ICD-10-CM | POA: Diagnosis not present

## 2022-07-01 DIAGNOSIS — I10 Essential (primary) hypertension: Secondary | ICD-10-CM | POA: Diagnosis not present

## 2022-07-01 DIAGNOSIS — F4323 Adjustment disorder with mixed anxiety and depressed mood: Secondary | ICD-10-CM | POA: Diagnosis not present

## 2022-07-01 DIAGNOSIS — R918 Other nonspecific abnormal finding of lung field: Secondary | ICD-10-CM | POA: Diagnosis not present

## 2022-07-01 DIAGNOSIS — Z09 Encounter for follow-up examination after completed treatment for conditions other than malignant neoplasm: Secondary | ICD-10-CM | POA: Diagnosis not present

## 2022-07-01 DIAGNOSIS — G8929 Other chronic pain: Secondary | ICD-10-CM

## 2022-07-01 LAB — POCT GLYCOSYLATED HEMOGLOBIN (HGB A1C): HbA1c, POC (controlled diabetic range): 7.7 % — AB (ref 0.0–7.0)

## 2022-07-01 MED ORDER — BUDESONIDE-FORMOTEROL FUMARATE 160-4.5 MCG/ACT IN AERO: 2.0000 | INHALATION_SPRAY | Freq: Two times a day (BID) | RESPIRATORY_TRACT | 12 refills | Status: DC

## 2022-07-01 MED ORDER — LANTUS SOLOSTAR 100 UNIT/ML ~~LOC~~ SOPN: 25.0000 [IU] | PEN_INJECTOR | Freq: Every day | SUBCUTANEOUS | 99 refills | Status: DC

## 2022-07-01 MED ORDER — OZEMPIC (0.25 OR 0.5 MG/DOSE) 2 MG/3ML ~~LOC~~ SOPN: 0.2500 mg | PEN_INJECTOR | SUBCUTANEOUS | 2 refills | Status: DC

## 2022-07-01 MED ORDER — PROPRANOLOL HCL 10 MG PO TABS: 10.0000 mg | ORAL_TABLET | Freq: Two times a day (BID) | ORAL | 1 refills | Status: DC

## 2022-07-01 NOTE — Progress Notes (Addendum)
[facility]   Subjective:   Yvette Gibbs is a 32 y.o. female presents for ED follow up.  Presented on  06/09/22, with a  2 day onset of  sore throat, rhinnorhea, nausea vomiting, diarrhea fever, chills, body aches with patient was discharged from the hospital on 06/10/22, Respiratory panel performed and positive for influenza A. She is here today to start taking better care of her self and requesting medication refills. Patient has No chest pain, No abdominal pain - No Nausea, No new weakness tingling or numbness  . She is having  headache that become worst with light , spending, but not before.  Also ,shortness of breath and Cough. Past Medical History:  Diagnosis Date   Acute exacerbation of CHF (congestive heart failure) ([place]) 05/04/2021   Asthma    Depression    Diabetes mellitus type 2, uncontrolled    Extreme obesity    Family history of adverse reaction to anesthesia    " my grandmother had an allergic reaction   Hypertension    Mental disorder    Obesity    OSA (obstructive sleep apnea)      Allergies  Allergen Reactions   Bee Pollen Other (See Comments)    Sneezing and eye burning   Fish Allergy Itching and Rash   Lisinopril Rash    Caused a rash and caused her to sweat a lot.   Metformin And Related Itching and Rash      Current Outpatient Medications on File Prior to Visit  Medication Sig Dispense Refill   Accu-Chek Softclix Lancets lancets Use as instructed 100 each 0   acetaminophen (TYLENOL) 500 MG tablet Take 1 tablet (500 mg total) by mouth every 6 (six) hours as needed. 30 tablet 0   albuterol (VENTOLIN HFA) 108 (90 Base) MCG/ACT inhaler Inhale 1-2 puffs into the lungs every 6 (six) hours as needed for wheezing or shortness of breath. 18 g 3   amLODipine (NORVASC) 10 MG tablet Take 1 tablet (10 mg total) by mouth daily. (Patient not taking: Reported on 06/10/2022) 30 tablet 0   amoxicillin-clavulanate (AUGMENTIN) 875-125 MG tablet Take 1  tablet by mouth every 12 (twelve) hours. (Patient not taking: Reported on 06/10/2022) 14 tablet 0   benzonatate (TESSALON) 100 MG capsule Take 1 capsule (100 mg total) by mouth every 8 (eight) hours. (Patient not taking: Reported on 06/10/2022) 21 capsule 0   ibuprofen (ADVIL) 600 MG tablet Take 1 tablet (600 mg total) by mouth every 6 (six) hours as needed. 30 tablet 0   insulin glargine (LANTUS) 100 UNIT/ML Solostar Pen Inject 45 Units into the skin daily. (Patient not taking: Reported on 06/10/2022) 15 mL 0   Insulin Pen Needle 32G X 4 MM MISC Use as directed 100 each 0   metFORMIN (GLUCOPHAGE) 1000 MG tablet Take 1 tablet (1,000 mg total) by mouth 2 (two) times daily with a meal. (Patient not taking: Reported on 06/10/2022) 180 tablet 1   ondansetron (ZOFRAN ODT) 4 MG disintegrating tablet Take 1 tablet (4 mg total) by mouth every 8 (eight) hours as needed for nausea or vomiting. (Patient not taking: Reported on 06/10/2022) 10 tablet 0   ondansetron (ZOFRAN) 8 MG tablet Take 1 tablet (8 mg total) by mouth every 8 (eight) hours as needed for nausea or vomiting. 20 tablet 0   pantoprazole (PROTONIX) 40 MG tablet TAKE 1 TABLET BY MOUTH EVERY DAY (Patient not taking: Reported on 06/10/2022) 30 tablet 0   torsemide (DEMADEX) 20  MG tablet Take 2 tablets (40 mg total) by mouth 2 (two) times daily. (Patient not taking: Reported on 06/10/2022) 120 tablet 0   No current facility-administered medications on file prior to visit.     Review of System: Comprehensive ROS Pertinent positive and negative noted in HPI    Objective:   Physical Exam:  General Appearance: Well nourished,sever morbid obesity in mild  distress. Eyes: PERRLA, EOMs, conjunctiva no swelling or erythema Sinuses: No Frontal/maxillary tenderness ENT/Mouth: Ext aud canals clear, TMs without erythema, bulging. No erythema, swelling, or exudate on post pharynx.  Tonsils not swollen or erythematous. Hearing normal.  Neck: Supple,  thyroid normal.  Respiratory: Respiratory effort normal, BS equal bilaterally without rales, rhonchi, wheezing or stridor.  Cardio: RRR with no MRGs. Brisk peripheral pulses without edema.  Abdomen: Soft, + BS.  Non tender, no guarding, rebound, hernias, masses. Lymphatics: Non tender without lymphadenopathy.  Musculoskeletal: Full ROM, 5/5 strength, normal gait.  Skin: Warm, dry without rashes, lesions, ecchymosis.  Neuro: Cranial nerves intact. Normal muscle tone, no cerebellar symptoms. Sensation intact.  Psych: Awake and oriented X 3, normal affect, Insight and Judgment appropriate.    Assessment:  Savvy was seen today for diabetes and weight management screening.  Diagnoses and all orders for this visit:  Type 2 diabetes mellitus without complication, without long-term current use of insulin (HCC) - educated on lifestyle modifications, including but not limited to diet choices and adding exercise to daily routine.   Starting on Lantus 25 units and Ozempic  -     POCT glycosylated hemoglobin (Hb A1C) 7.7 -     Microalbumin / creatinine urine ratio -     Lipid Panel -     Ambulatory referral to Ophthalmology  Hospital discharge follow-up/ 2/2 Infiltrate of right lung present on chest x-ray Per imaging and note -New focal pulmonary opacity in the peripheral right mid lung, suspicious for pneumonia. Recommend follow-up chest radiograph in 4-6 weeks to ensure resolution.  -     DG Chest 2 View; Future  Essential hypertension BP goal - < 130/80 Explained that having normal blood pressure is the goal and medications are helping to get to goal and maintain normal blood pressure. DIET: Limit salt intake, read nutrition labels to check salt content, limit fried and high fatty foods  Avoid using multisymptom OTC cold preparations that generally contain sudafed which can rise BP. Consult with pharmacist on best cold relief products to use for persons with HTN EXERCISE Discussed  incorporating exercise such as walking - 30 minutes most days of the week and can do in 10 minute intervals     Intermittent asthma without complication, unspecified asthma severity Manage by  Symbicort 160/4.5 MCG/ACT inhaler and nebulizer treatment  Encounter for HCV screening test for low risk patient -     HCV Ab w Reflex to Quant PCR  Morbid obesity (HCC) Discussed diet and exercise for person with BMI >25. Instructed: You must burn more calories than you eat. Losing 5 percent of your body weight should be considered a success. In the longer term, losing more than 15 percent of your body weight and staying at this weight is an extremely good result. However, keep in mind that even losing 5 percent of your body weight leads to important health benefits, so try not to get discouraged if you're not able to lose more than this. Will recheck weight in 3-6 months.    Adjustment disorder with mixed anxiety and depressed mood  No orders of the defined types were placed in this encounter.   This note has been created with Education officer, environmental. Any transcriptional errors are unintentional.   Grayce Sessions, NP 07/01/2022, 1:48 PM

## 2022-07-01 NOTE — Patient Instructions (Signed)
Semaglutide Injection What is this medication? SEMAGLUTIDE (SEM a GLOO tide) treats type 2 diabetes. It works by increasing insulin levels in your body, which decreases your blood sugar (glucose). It also reduces the amount of sugar released into the blood and slows down your digestion. It can also be used to lower the risk of heart attack and stroke in people with type 2 diabetes. Changes to diet and exercise are often combined with this medication. This medicine may be used for other purposes; ask your health care provider or pharmacist if you have questions. COMMON BRAND NAME(S): OZEMPIC What should I tell my care team before I take this medication? They need to know if you have any of these conditions: Endocrine tumors (MEN 2) or if someone in your family had these tumors Eye disease, vision problems History of pancreatitis Kidney disease Stomach problems Thyroid cancer or if someone in your family had thyroid cancer An unusual or allergic reaction to semaglutide, other medications, foods, dyes, or preservatives Pregnant or trying to get pregnant Breast-feeding How should I use this medication? This medication is for injection under the skin of your upper leg (thigh), stomach area, or upper arm. It is given once every week (every 7 days). You will be taught how to prepare and give this medication. Use exactly as directed. Take your medication at regular intervals. Do not take it more often than directed. If you use this medication with insulin, you should inject this medication and the insulin separately. Do not mix them together. Do not give the injections right next to each other. Change (rotate) injection sites with each injection. It is important that you put your used needles and syringes in a special sharps container. Do not put them in a trash can. If you do not have a sharps container, call your pharmacist or care team to get one. A special MedGuide will be given to you by the  pharmacist with each prescription and refill. Be sure to read this information carefully each time. This medication comes with INSTRUCTIONS FOR USE. Ask your pharmacist for directions on how to use this medication. Read the information carefully. Talk to your pharmacist or care team if you have questions. Talk to your care team about the use of this medication in children. Special care may be needed. Overdosage: If you think you have taken too much of this medicine contact a poison control center or emergency room at once. NOTE: This medicine is only for you. Do not share this medicine with others. What if I miss a dose? If you miss a dose, take it as soon as you can within 5 days after the missed dose. Then take your next dose at your regular weekly time. If it has been longer than 5 days after the missed dose, do not take the missed dose. Take the next dose at your regular time. Do not take double or extra doses. If you have questions about a missed dose, contact your care team for advice. What may interact with this medication? Other medications for diabetes Many medications may cause changes in blood sugar, these include: Alcohol containing beverages Antiviral medications for HIV or AIDS Aspirin and aspirin-like medications Certain medications for blood pressure, heart disease, irregular heart beat Chromium Diuretics Female hormones, such as estrogens or progestins, birth control pills Fenofibrate Gemfibrozil Isoniazid Lanreotide Female hormones or anabolic steroids MAOIs like Carbex, Eldepryl, Marplan, Nardil, and Parnate Medications for weight loss Medications for allergies, asthma, cold, or cough Medications for depression,  anxiety, or psychotic disturbances Niacin Nicotine NSAIDs, medications for pain and inflammation, like ibuprofen or naproxen Octreotide Pasireotide Pentamidine Phenytoin Probenecid Quinolone antibiotics such as ciprofloxacin, levofloxacin, ofloxacin Some  herbal dietary supplements Steroid medications such as prednisone or cortisone Sulfamethoxazole; trimethoprim Thyroid hormones Some medications can hide the warning symptoms of low blood sugar (hypoglycemia). You may need to monitor your blood sugar more closely if you are taking one of these medications. These include: Beta-blockers, often used for high blood pressure or heart problems (examples include atenolol, metoprolol, propranolol) Clonidine Guanethidine Reserpine This list may not describe all possible interactions. Give your health care provider a list of all the medicines, herbs, non-prescription drugs, or dietary supplements you use. Also tell them if you smoke, drink alcohol, or use illegal drugs. Some items may interact with your medicine. What should I watch for while using this medication? Visit your care team for regular checks on your progress. Drink plenty of fluids while taking this medication. Check with your care team if you get an attack of severe diarrhea, nausea, and vomiting. The loss of too much body fluid can make it dangerous for you to take this medication. A test called the HbA1C (A1C) will be monitored. This is a simple blood test. It measures your blood sugar control over the last 2 to 3 months. You will receive this test every 3 to 6 months. Learn how to check your blood sugar. Learn the symptoms of low and high blood sugar and how to manage them. Always carry a quick-source of sugar with you in case you have symptoms of low blood sugar. Examples include hard sugar candy or glucose tablets. Make sure others know that you can choke if you eat or drink when you develop serious symptoms of low blood sugar, such as seizures or unconsciousness. They must get medical help at once. Tell your care team if you have high blood sugar. You might need to change the dose of your medication. If you are sick or exercising more than usual, you might need to change the dose of your  medication. Do not skip meals. Ask your care team if you should avoid alcohol. Many nonprescription cough and cold products contain sugar or alcohol. These can affect blood sugar. Pens should never be shared. Even if the needle is changed, sharing may result in passing of viruses like hepatitis or HIV. Wear a medical ID bracelet or chain, and carry a card that describes your disease and details of your medication and dosage times. Do not become pregnant while taking this medication. Women should inform their care team if they wish to become pregnant or think they might be pregnant. There is a potential for serious side effects to an unborn child. Talk to your care team for more information. What side effects may I notice from receiving this medication? Side effects that you should report to your care team as soon as possible: Allergic reactions--skin rash, itching, hives, swelling of the face, lips, tongue, or throat Change in vision Dehydration--increased thirst, dry mouth, feeling faint or lightheaded, headache, dark yellow or brown urine Gallbladder problems--severe stomach pain, nausea, vomiting, fever Heart palpitations--rapid, pounding, or irregular heartbeat Kidney injury--decrease in the amount of urine, swelling of the ankles, hands, or feet Pancreatitis--severe stomach pain that spreads to your back or gets worse after eating or when touched, fever, nausea, vomiting Thyroid cancer--new mass or lump in the neck, pain or trouble swallowing, trouble breathing, hoarseness Side effects that usually do not require medical  attention (report to your care team if they continue or are bothersome): Diarrhea Loss of appetite Nausea Stomach pain Vomiting This list may not describe all possible side effects. Call your doctor for medical advice about side effects. You may report side effects to FDA at 1-800-FDA-1088. Where should I keep my medication? Keep out of the reach of children. Store  unopened pens in a refrigerator between 2 and 8 degrees C (36 and 46 degrees F). Do not freeze. Protect from light and heat. After you first use the pen, it can be stored for 56 days at room temperature between 15 and 30 degrees C (59 and 86 degrees F) or in a refrigerator. Throw away your used pen after 56 days or after the expiration date, whichever comes first. Do not store your pen with the needle attached. If the needle is left on, medication may leak from the pen. NOTE: This sheet is a summary. It may not cover all possible information. If you have questions about this medicine, talk to your doctor, pharmacist, or health care provider.  2023 Elsevier/Gold Standard (2020-08-15 00:00:00)  

## 2022-07-01 NOTE — Telephone Encounter (Signed)
Requested medication (s) are due for refill today: yes  Requested medication (s) are on the active medication list: yes  Last refill:  05/07/21  Future visit scheduled: yes  Notes to clinic:  Unable to refill per protocol, last refill by another provider.      Requested Prescriptions  Pending Prescriptions Disp Refills   GLOBAL EASE INJECT PEN NEEDLES 32G X 4 MM MISC [Pharmacy Med Name: Pen Needle 32 gauge x 5/32"] 100 each PRN    Sig: Use as directed TO INJECT lantus insulin     Endocrinology: Diabetes - Testing Supplies Passed - 07/01/2022  2:42 PM      Passed - Valid encounter within last 12 months    Recent Outpatient Visits           Today Type 2 diabetes mellitus without complication, without long-term current use of insulin ([place])   [place] [facility] Kerin Perna, NP   1 year ago Hospital discharge follow-up   [place], [place], NP   1 year ago Hospital discharge follow-up   [place] Kerin Perna, NP   1 year ago Type 2 diabetes mellitus without complication, without long-term current use of insulin ([place])   [place] [facility] Kerin Perna, NP   3 years ago Type 2 diabetes mellitus without complication, without long-term current use of insulin ([place])   [place], MD       Future Appointments             In 1 month Oletta Lamas, [place] Cage, NP [place]

## 2022-07-02 LAB — LIPID PANEL
Chol/HDL Ratio: 3.4 ratio (ref 0.0–4.4)
Cholesterol, Total: 228 mg/dL — ABNORMAL HIGH (ref 100–199)
HDL: 68 mg/dL (ref >39)
LDL Chol Calc (NIH): 122 mg/dL — ABNORMAL HIGH (ref 0–99)
Triglycerides: 217 mg/dL — ABNORMAL HIGH (ref 0–149)
VLDL Cholesterol Cal: 38 mg/dL (ref 5–40)

## 2022-07-02 LAB — MICROALBUMIN / CREATININE URINE RATIO
Creatinine, Urine: 41.9 mg/dL
Microalb/Creat Ratio: 7 mg/g creat (ref 0–29)
Microalbumin, Urine: 3.1 ug/mL

## 2022-07-02 LAB — HCV INTERPRETATION

## 2022-07-02 LAB — HCV AB W REFLEX TO QUANT PCR: HCV Ab: NONREACTIVE

## 2022-07-03 ENCOUNTER — Other Ambulatory Visit (INDEPENDENT_AMBULATORY_CARE_PROVIDER_SITE_OTHER): Payer: Self-pay | Admitting: Primary Care

## 2022-07-06 ENCOUNTER — Other Ambulatory Visit (INDEPENDENT_AMBULATORY_CARE_PROVIDER_SITE_OTHER): Payer: Self-pay | Admitting: Primary Care

## 2022-07-06 MED ORDER — ROSUVASTATIN CALCIUM 20 MG PO TABS: 20.0000 mg | ORAL_TABLET | Freq: Every day | ORAL | 1 refills | Status: DC

## 2022-07-15 DIAGNOSIS — F321 Major depressive disorder, single episode, moderate: Secondary | ICD-10-CM | POA: Diagnosis not present

## 2022-07-16 ENCOUNTER — Telehealth: Payer: Self-pay | Admitting: General Practice

## 2022-07-16 DIAGNOSIS — Z419 Encounter for procedure for purposes other than remedying health state, unspecified: Secondary | ICD-10-CM | POA: Diagnosis not present

## 2022-07-16 NOTE — Telephone Encounter (Signed)
Spoke with patient, she declined counseling services with [facility] at this time due to time conflict. However the following resources were provided via mychart.   [place]  Phone:[phone]  Address: [address], [place], [place] [address]  (Accepts Medicaid and Medicare)   [employer]  Address: [phone], [place] [address]  Phone: [phone]  [facility])   [facility] (Psychiatry only)  [phone]  Address: [address], [place], [place] [address]  (Accepts Medicaid and Medicare)   [facility] 2 locations  [place] [address], [place] [address]  Phone: [phone]   Address: [address]  [place], [place] [address]  Phone: [phone]  (Accepts Medicaid)        5. [facility] [place] [facility] (at [employer], [place] [address]  Phone: [phone]  (Accepts Medicaid and Medicare)     6. [employer] (Therapy and psychiatry)  Address: [address] [facility] (near [place])  [address], [place]  [place], [place] [address]  Phone: [phone]

## 2022-07-16 NOTE — Telephone Encounter (Signed)
-----  Message from Shann Medal, [place] sent at 07/02/2022  2:53 PM EST ----- Regarding: follow up Please follow up and evaluate clients needs from notes and client. Provide resources as needed.  ----- Message ----- From: Kerin Perna, NP Sent: 07/01/2022   2:39 PM EST To: Shann Medal, LCSW  Patient would benefit from LCSW intervention

## 2022-07-23 ENCOUNTER — Emergency Department (HOSPITAL_COMMUNITY)
Admission: EM | Admit: 2022-07-23 | Discharge: 2022-07-24 | Disposition: A | Discharge status: Home / Self Care | Payer: Medicaid Other | Attending: Emergency Medicine | Admitting: Emergency Medicine

## 2022-07-23 ENCOUNTER — Emergency Department (HOSPITAL_COMMUNITY): Payer: Medicaid Other

## 2022-07-23 ENCOUNTER — Other Ambulatory Visit: Payer: Self-pay

## 2022-07-23 DIAGNOSIS — R0789 Other chest pain: Secondary | ICD-10-CM | POA: Insufficient documentation

## 2022-07-23 DIAGNOSIS — R079 Chest pain, unspecified: Secondary | ICD-10-CM

## 2022-07-23 DIAGNOSIS — R0602 Shortness of breath: Secondary | ICD-10-CM | POA: Diagnosis not present

## 2022-07-23 DIAGNOSIS — Z1152 Encounter for screening for COVID-19: Secondary | ICD-10-CM | POA: Insufficient documentation

## 2022-07-23 DIAGNOSIS — M25562 Pain in left knee: Secondary | ICD-10-CM | POA: Insufficient documentation

## 2022-07-23 DIAGNOSIS — R600 Localized edema: Secondary | ICD-10-CM | POA: Diagnosis not present

## 2022-07-23 NOTE — ED Triage Notes (Signed)
Pt arrived from home via [facility] c/o chest pain 10/10 on pain scale. Per pt feels like stabbing and an elephant. Pain has been going onm for 2 months tonight she woke up and was SOB with the pain.

## 2022-07-24 ENCOUNTER — Encounter (HOSPITAL_COMMUNITY): Payer: Self-pay

## 2022-07-24 ENCOUNTER — Emergency Department (HOSPITAL_COMMUNITY): Payer: Medicaid Other

## 2022-07-24 DIAGNOSIS — R079 Chest pain, unspecified: Secondary | ICD-10-CM | POA: Diagnosis not present

## 2022-07-24 DIAGNOSIS — R600 Localized edema: Secondary | ICD-10-CM | POA: Diagnosis not present

## 2022-07-24 LAB — CBC
HCT: 40.2 % (ref 36.0–46.0)
Hemoglobin: 11.8 g/dL — ABNORMAL LOW (ref 12.0–15.0)
MCH: 25.7 pg — ABNORMAL LOW (ref 26.0–34.0)
MCHC: 29.4 g/dL — ABNORMAL LOW (ref 30.0–36.0)
MCV: 87.6 fL (ref 80.0–100.0)
Platelets: 335 10*3/uL (ref 150–400)
RBC: 4.59 MIL/uL (ref 3.87–5.11)
RDW: 14.1 % (ref 11.5–15.5)
WBC: 6.4 10*3/uL (ref 4.0–10.5)
nRBC: 0 % (ref 0.0–0.2)

## 2022-07-24 LAB — RESP PANEL BY RT-PCR (RSV, FLU A&B, COVID)  RVPGX2
Influenza A by PCR: NEGATIVE
Influenza B by PCR: NEGATIVE
Resp Syncytial Virus by PCR: NEGATIVE
SARS Coronavirus 2 by RT PCR: NEGATIVE

## 2022-07-24 LAB — BASIC METABOLIC PANEL
Anion gap: 11 (ref 5–15)
BUN: 7 mg/dL (ref 6–20)
CO2: 29 mmol/L (ref 22–32)
Calcium: 9.2 mg/dL (ref 8.9–10.3)
Chloride: 92 mmol/L — ABNORMAL LOW (ref 98–111)
Creatinine, Ser: 0.57 mg/dL (ref 0.44–1.00)
GFR, Estimated: >60 mL/min (ref >60)
Glucose, Bld: 299 mg/dL — ABNORMAL HIGH (ref 70–99)
Potassium: 4.2 mmol/L (ref 3.5–5.1)
Sodium: 132 mmol/L — ABNORMAL LOW (ref 135–145)

## 2022-07-24 LAB — TROPONIN I (HIGH SENSITIVITY)
Troponin I (High Sensitivity): 5 ng/L (ref <18)
Troponin I (High Sensitivity): 6 ng/L (ref <18)

## 2022-07-24 LAB — I-STAT BETA HCG BLOOD, ED (MC, WL, AP ONLY): I-stat hCG, quantitative: <5 m[IU]/mL (ref <5)

## 2022-07-24 LAB — BRAIN NATRIURETIC PEPTIDE: B Natriuretic Peptide: 10.1 pg/mL (ref 0.0–100.0)

## 2022-07-24 LAB — D-DIMER, QUANTITATIVE: D-Dimer, Quant: <0.27 ug/mL-FEU (ref 0.00–0.50)

## 2022-07-24 MED ORDER — ACETAMINOPHEN 325 MG PO TABS
650.0000 mg | ORAL_TABLET | Freq: Four times a day (QID) | ORAL | Status: DC | PRN
  Administered 2022-07-24: 650 mg via ORAL
  Filled 2022-07-24: qty 2

## 2022-07-24 MED ORDER — IOHEXOL 350 MG/ML SOLN
75.0000 mL | Freq: Once | INTRAVENOUS | Status: AC | PRN
  Administered 2022-07-24: 75 mL via INTRAVENOUS

## 2022-07-24 NOTE — ED Notes (Signed)
Patient has sleep apnea and was desatting into high 70s and low 80s on RA. Placed patient on 2L [place] at this time.

## 2022-07-24 NOTE — ED Provider Triage Note (Signed)
Emergency Medicine Provider Triage Evaluation Note  Yvette Gibbs is a 32 y.o. female with asthma, hypertension, type 2 diabetes, obesity, NAFLD who presents with CP.   P/w stabbing left sided chest pain 10/10 on pain scale, has been ongoing x 2 months but worsening and associated with SOB. Denies f/c. States that she had been seen in December for influenza and saw on her mychart she might have pneumonia but no one mentioned that to her. Endorses SOB as well. Also endorsing L knee pain, atraumatic, with swelling. Denies numbness/tingling.   Review of Systems  Positive: CP, SOB, left leg swelling Negative: F/c, N/V  Physical Exam  BP (!) 160/91   Pulse (!) 107   Temp 98.6 F (37 C) (Oral)   Resp 20   SpO2 97%  Gen:   Awake, no distress   Resp:  Normal effort  MSK:   Moves extremities without difficulty  Other:  Sinus tachycardia on monitor  Medical Decision Making  Medically screening exam initiated at 12:00 AM.  Appropriate orders placed.  Yvette Gibbs was informed that the remainder of the evaluation will be completed by another provider, this initial triage assessment does not replace that evaluation, and the importance of remaining in the ED until their evaluation is complete.     Audley Hose, MD 07/24/22 [place]

## 2022-07-24 NOTE — Discharge Instructions (Addendum)
No certain cause of your symptoms was found tonight.  Your tests were reassuring.    You need to follow-up with you doctor and probably need to have a sleep study and use a CPAP machine for sleep apnea.  Your oxygen level drops when you are sleeping and I believe this is from obstructive sleep apnea.  I've also placed a referral to cardiology.  They will contact you.

## 2022-07-24 NOTE — ED Notes (Signed)
Patient transported to CT in NAD.

## 2022-07-24 NOTE — ED Provider Notes (Signed)
[facility] Emergency Department Provider Note MRN:  [id number]  Arrival date & time: 07/24/22     Chief Complaint   Chest Pain   History of Present Illness   Yvette Gibbs is a 32 y.o. year-old female presents to the ED with chief complaint of stabbing left sided chest pain for the past 2 months.  She complains of some SOB.  States that she has had flu and pneumonia before and is concerned about that.  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  Denies hx of PE.  Reports hx of sleep apnea, but doesn't have a CPAP.  History provided by patient.   Review of Systems  Pertinent positive and negative review of systems noted in HPI.    Physical Exam   Vitals:   07/24/22 0335 07/24/22 0356  BP:  (!) 159/102  Pulse:    Resp:    Temp: 98.5 F (36.9 C)   SpO2:      CONSTITUTIONAL:  non toxic-appearing, NAD NEURO:  Alert and oriented x 3, CN 3-12 grossly intact EYES:  eyes equal and reactive ENT/NECK:  Supple, no stridor  CARDIO:  normal rate, regular rhythm, appears well-perfused  PULM:  No respiratory distress, CTAB GI/GU:  non-distended,  MSK/SPINE:  No gross deformities, no edema, moves all extremities  SKIN:  no rash, atraumatic   *Additional and/or pertinent findings included in MDM below  Diagnostic and Interventional Summary    EKG Interpretation  Date/Time:  Thursday July 23 2022 23:54:48 EST Ventricular Rate:  106 PR Interval:  152 QRS Duration: 111 QT Interval:  370 QTC Calculation: 492 R Axis:   -35 Text Interpretation: Sinus tachycardia Incomplete RBBB and LAFB Consider right ventricular hypertrophy Borderline prolonged QT interval Confirmed by Gerlene Fee [id number]) on 07/24/2022 1:52:12 AM       Labs Reviewed  BASIC METABOLIC PANEL - Abnormal; Notable for the following components:      Result Value   Sodium 132 (*)    Chloride 92 (*)    Glucose, Bld 299 (*)    All other components within  normal limits  CBC - Abnormal; Notable for the following components:   Hemoglobin 11.8 (*)    MCH 25.7 (*)    MCHC 29.4 (*)    All other components within normal limits  RESP PANEL BY RT-PCR (RSV, FLU A&B, COVID)  RVPGX2  D-DIMER, QUANTITATIVE  BRAIN NATRIURETIC PEPTIDE  I-STAT BETA HCG BLOOD, ED ([facility], [facility], [facility] ONLY)  TROPONIN I (HIGH SENSITIVITY)  TROPONIN I (HIGH SENSITIVITY)    CT Angio Chest PE W and/or Wo Contrast  Final Result    DG Knee Complete 4 Views Left  Final Result    DG Chest 2 View  Final Result      Medications  acetaminophen (TYLENOL) tablet 650 mg (650 mg Oral Given 07/24/22 0403)  iohexol (OMNIPAQUE) 350 MG/ML injection 75 mL (75 mLs Intravenous Contrast Given 07/24/22 0230)     Procedures  /  Critical Care Procedures  ED Course and Medical Decision Making  I have reviewed the triage vital signs, the nursing notes, and pertinent available records from the EMR.  Social Determinants Affecting Complexity of Care: Patient has no clinically significant social determinants affecting this chief complaint..   ED Course: Clinical Course as of 07/24/22 0420  Thu Jul 23, 2022  2346 DG Chest 2 View No active cardiopulmonary disease. Borderline to mild cardiomegaly. [HN]    Clinical Course User  Index [HN] Audley Hose, MD    Medical Decision Making Patient here with chest pain and SOB.  ACS thought less likely because symptoms have been ongoing for a few weeks.  PE is considered, and while d-dimer is negative, due to hypoxia and tachycardia will check PE study.  CT PE negative.  Hypoxic only while asleep and snoring loudly.  Likely obstructive sleep apnea, which could be the cause of many of her symptoms.  I've encouraged her to get a PCP and do a sleep study and use a CPAP.  Covid and flu negative.  Trops negative, doubt ACS. BNP normal, doubt CHF, no fluid on legs  Outpatient cardiology referral.  Amount and/or Complexity of Data Reviewed Labs:  ordered. Radiology: ordered. Decision-making details documented in ED Course.  Risk OTC drugs. Prescription drug management.     Consultants: No consultations were needed in caring for this patient.   Treatment and Plan: I considered admission due to patient's initial presentation, but after considering the examination and diagnostic results, patient will not require admission and can be discharged with outpatient follow-up.    Final Clinical Impressions(s) / ED Diagnoses     ICD-10-CM   1. Nonspecific chest pain  R07.9 Ambulatory referral to Cardiology      ED Discharge Orders          Ordered    Ambulatory referral to Cardiology       Comments: If you have not heard from the Cardiology office within the next 72 hours please call [phone].   07/24/22 0347              Discharge Instructions Discussed with and Provided to Patient:    Discharge Instructions      No certain cause of your symptoms was found tonight.  Your tests were reassuring.    You need to follow-up with you doctor and probably need to have a sleep study and use a CPAP machine for sleep apnea.  Your oxygen level drops when you are sleeping and I believe this is from obstructive sleep apnea.  I've also placed a referral to cardiology.  They will contact you.      Montine Circle, PA-C 07/24/22 0424    Maudie Flakes, MD 07/24/22 [phone]

## 2022-07-28 DIAGNOSIS — F321 Major depressive disorder, single episode, moderate: Secondary | ICD-10-CM | POA: Diagnosis not present

## 2022-08-03 DIAGNOSIS — F321 Major depressive disorder, single episode, moderate: Secondary | ICD-10-CM | POA: Diagnosis not present

## 2022-08-10 DIAGNOSIS — F321 Major depressive disorder, single episode, moderate: Secondary | ICD-10-CM | POA: Diagnosis not present

## 2022-08-12 ENCOUNTER — Ambulatory Visit (INDEPENDENT_AMBULATORY_CARE_PROVIDER_SITE_OTHER): Payer: Medicaid Other | Admitting: Primary Care

## 2022-08-13 ENCOUNTER — Ambulatory Visit (INDEPENDENT_AMBULATORY_CARE_PROVIDER_SITE_OTHER): Payer: Medicaid Other | Admitting: Primary Care

## 2022-08-14 DIAGNOSIS — Z419 Encounter for procedure for purposes other than remedying health state, unspecified: Secondary | ICD-10-CM | POA: Diagnosis not present

## 2022-08-18 DIAGNOSIS — F321 Major depressive disorder, single episode, moderate: Secondary | ICD-10-CM | POA: Diagnosis not present

## 2022-08-19 ENCOUNTER — Ambulatory Visit (INDEPENDENT_AMBULATORY_CARE_PROVIDER_SITE_OTHER): Payer: Medicaid Other | Admitting: Primary Care

## 2022-08-23 ENCOUNTER — Encounter: Payer: Self-pay | Admitting: Cardiovascular Disease

## 2022-08-23 NOTE — Progress Notes (Signed)
This encounter was created in error - please disregard.

## 2022-08-24 ENCOUNTER — Emergency Department (HOSPITAL_COMMUNITY): Payer: No Typology Code available for payment source

## 2022-08-24 ENCOUNTER — Other Ambulatory Visit: Payer: Self-pay

## 2022-08-24 ENCOUNTER — Inpatient Hospital Stay (HOSPITAL_COMMUNITY)
Admission: EM | Admit: 2022-08-24 | Discharge: 2022-08-27 | DRG: 202 | Disposition: A | Discharge status: Home / Self Care | Payer: No Typology Code available for payment source | Attending: Family Medicine | Admitting: Family Medicine

## 2022-08-24 ENCOUNTER — Encounter: Payer: Medicaid Other | Admitting: Cardiovascular Disease

## 2022-08-24 DIAGNOSIS — I10 Essential (primary) hypertension: Secondary | ICD-10-CM | POA: Diagnosis present

## 2022-08-24 DIAGNOSIS — Z87891 Personal history of nicotine dependence: Secondary | ICD-10-CM

## 2022-08-24 DIAGNOSIS — R0602 Shortness of breath: Secondary | ICD-10-CM | POA: Diagnosis not present

## 2022-08-24 DIAGNOSIS — J45909 Unspecified asthma, uncomplicated: Secondary | ICD-10-CM | POA: Diagnosis present

## 2022-08-24 DIAGNOSIS — I110 Hypertensive heart disease with heart failure: Secondary | ICD-10-CM | POA: Diagnosis present

## 2022-08-24 DIAGNOSIS — E1165 Type 2 diabetes mellitus with hyperglycemia: Secondary | ICD-10-CM | POA: Diagnosis present

## 2022-08-24 DIAGNOSIS — Z7951 Long term (current) use of inhaled steroids: Secondary | ICD-10-CM

## 2022-08-24 DIAGNOSIS — Z6841 Body Mass Index (BMI) 40.0 and over, adult: Secondary | ICD-10-CM

## 2022-08-24 DIAGNOSIS — T380X5A Adverse effect of glucocorticoids and synthetic analogues, initial encounter: Secondary | ICD-10-CM | POA: Diagnosis present

## 2022-08-24 DIAGNOSIS — K760 Fatty (change of) liver, not elsewhere classified: Secondary | ICD-10-CM | POA: Diagnosis present

## 2022-08-24 DIAGNOSIS — J45901 Unspecified asthma with (acute) exacerbation: Principal | ICD-10-CM | POA: Diagnosis present

## 2022-08-24 DIAGNOSIS — I509 Heart failure, unspecified: Secondary | ICD-10-CM | POA: Diagnosis present

## 2022-08-24 DIAGNOSIS — K219 Gastro-esophageal reflux disease without esophagitis: Secondary | ICD-10-CM | POA: Diagnosis present

## 2022-08-24 DIAGNOSIS — Z794 Long term (current) use of insulin: Secondary | ICD-10-CM

## 2022-08-24 DIAGNOSIS — Z1152 Encounter for screening for COVID-19: Secondary | ICD-10-CM

## 2022-08-24 DIAGNOSIS — E785 Hyperlipidemia, unspecified: Secondary | ICD-10-CM | POA: Diagnosis present

## 2022-08-24 DIAGNOSIS — D649 Anemia, unspecified: Secondary | ICD-10-CM | POA: Diagnosis present

## 2022-08-24 DIAGNOSIS — Z7985 Long-term (current) use of injectable non-insulin antidiabetic drugs: Secondary | ICD-10-CM

## 2022-08-24 DIAGNOSIS — Z833 Family history of diabetes mellitus: Secondary | ICD-10-CM

## 2022-08-24 DIAGNOSIS — Z91013 Allergy to seafood: Secondary | ICD-10-CM

## 2022-08-24 DIAGNOSIS — E119 Type 2 diabetes mellitus without complications: Secondary | ICD-10-CM

## 2022-08-24 DIAGNOSIS — Z888 Allergy status to other drugs, medicaments and biological substances status: Secondary | ICD-10-CM

## 2022-08-24 DIAGNOSIS — Z79899 Other long term (current) drug therapy: Secondary | ICD-10-CM

## 2022-08-24 DIAGNOSIS — Z8249 Family history of ischemic heart disease and other diseases of the circulatory system: Secondary | ICD-10-CM

## 2022-08-24 DIAGNOSIS — G4733 Obstructive sleep apnea (adult) (pediatric): Secondary | ICD-10-CM | POA: Diagnosis present

## 2022-08-24 DIAGNOSIS — Z91030 Bee allergy status: Secondary | ICD-10-CM

## 2022-08-24 DIAGNOSIS — R739 Hyperglycemia, unspecified: Secondary | ICD-10-CM

## 2022-08-24 DIAGNOSIS — J9601 Acute respiratory failure with hypoxia: Secondary | ICD-10-CM | POA: Diagnosis present

## 2022-08-24 DIAGNOSIS — E662 Morbid (severe) obesity with alveolar hypoventilation: Secondary | ICD-10-CM | POA: Diagnosis present

## 2022-08-24 NOTE — ED Provider Notes (Signed)
[facility] Provider Note   CSN: [id number] Arrival date & time: 08/24/22  2233     History {Add pertinent medical, surgical, social history, OB history to HPI:1} Chief Complaint  Patient presents with   Shortness of Breath    Yvette Gibbs is a 32 y.o. female.  Patient brought to the emergency department from home by ambulance for evaluation of shortness of breath.  Patient with history of asthma.  Patient complaining of shortness of breath and tightness in her chest.  EMS provided 10 mg of albuterol, 1 mg of Atrovent, 125 mg of Solu-Medrol and 2 g of magnesium during transport.       Home Medications Prior to Admission medications   Medication Sig Start Date End Date Taking? Authorizing Provider  budesonide-formoterol (SYMBICORT) 160-4.5 MCG/ACT inhaler Inhale 2 puffs into the lungs 2 (two) times daily. 07/01/22   Kerin Perna, NP  GLOBAL EASE INJECT PEN NEEDLES 32G X 4 MM MISC USE TO INJECT lantus EVERY DAY AS DIRECTED 07/03/22   Kerin Perna, NP  insulin glargine (LANTUS SOLOSTAR) 100 UNIT/ML Solostar Pen Inject 25 Units into the skin daily. 07/01/22   Kerin Perna, NP  propranolol (INDERAL) 10 MG tablet Take 1 tablet (10 mg total) by mouth 2 (two) times daily. 07/01/22   Kerin Perna, NP  rosuvastatin (CRESTOR) 20 MG tablet Take 1 tablet (20 mg total) by mouth daily. 07/06/22   Kerin Perna, NP  Semaglutide,0.25 or 0.'5MG'$ /DOS, (OZEMPIC, 0.25 OR 0.5 MG/DOSE,) 2 MG/3ML SOPN Inject 0.25 mg into the skin once a week. For 4 weeks. Then, increase to 0.5 mg into the skin once a week thereafter. 07/01/22   Charlott Rakes, MD      Allergies    Bee pollen, Fish allergy, Lisinopril, and Metformin and related    Review of Systems   Review of Systems  Physical Exam Updated Vital Signs BP (!) 159/79 (BP Location: Right Arm)   Pulse 96   Temp 98 F (36.7 C) (Oral)   Ht '5\' 2"'$  (1.575 m)   Wt (!) 170.1 kg    SpO2 97%   BMI 68.59 kg/m  Physical Exam Vitals and nursing note reviewed.  Constitutional:      General: She is not in acute distress.    Appearance: She is well-developed. She is obese.  HENT:     Head: Normocephalic and atraumatic.     Mouth/Throat:     Mouth: Mucous membranes are moist.  Eyes:     General: Vision grossly intact. Gaze aligned appropriately.     Extraocular Movements: Extraocular movements intact.     Conjunctiva/sclera: Conjunctivae normal.  Cardiovascular:     Rate and Rhythm: Normal rate and regular rhythm.     Pulses: Normal pulses.     Heart sounds: Normal heart sounds, S1 normal and S2 normal. No murmur heard.    No friction rub. No gallop.  Pulmonary:     Effort: Accessory muscle usage present. No respiratory distress.     Breath sounds: Decreased breath sounds and wheezing present.  Abdominal:     General: Bowel sounds are normal.     Palpations: Abdomen is soft.     Tenderness: There is no abdominal tenderness. There is no guarding or rebound.     Hernia: No hernia is present.  Musculoskeletal:        General: No swelling.     Cervical back: Full passive range of motion without  pain, normal range of motion and neck supple. No spinous process tenderness or muscular tenderness. Normal range of motion.     Right lower leg: No edema.     Left lower leg: No edema.  Skin:    General: Skin is warm and dry.     Capillary Refill: Capillary refill takes less than 2 seconds.     Findings: No ecchymosis, erythema, rash or wound.  Neurological:     General: No focal deficit present.     Mental Status: She is oriented to person, place, and time.     GCS: GCS eye subscore is 4. GCS verbal subscore is 5. GCS motor subscore is 6.     Cranial Nerves: Cranial nerves 2-12 are intact.     Sensory: Sensation is intact.     Motor: Motor function is intact.     Coordination: Coordination is intact.  Psychiatric:        Attention and Perception: Attention normal.         Mood and Affect: Mood normal.        Speech: Speech normal.        Behavior: Behavior normal.     ED Results / Procedures / Treatments   Labs (all labs ordered are listed, but only abnormal results are displayed) Labs Reviewed - No data to display  EKG None  Radiology No results found.  Procedures Procedures  {Document cardiac monitor, telemetry assessment procedure when appropriate:1}  Medications Ordered in ED Medications - No data to display  ED Course/ Medical Decision Making/ A&P   {   Click here for ABCD2, HEART and other calculatorsREFRESH Note before signing :1}                          Medical Decision Making Amount and/or Complexity of Data Reviewed Labs: ordered. Radiology: ordered.   ***  {Document critical care time when appropriate:1} {Document review of labs and clinical decision tools ie heart score, Chads2Vasc2 etc:1}  {Document your independent review of radiology images, and any outside records:1} {Document your discussion with family members, caretakers, and with consultants:1} {Document social determinants of health affecting pt's care:1} {Document your decision making why or why not admission, treatments were needed:1} Final Clinical Impression(s) / ED Diagnoses Final diagnoses:  None    Rx / DC Orders ED Discharge Orders     None

## 2022-08-24 NOTE — ED Triage Notes (Signed)
Pt from w/asthma exacerbation. C/o chest tightness and slight sob. 10 of albuterol, '1mg'$  atrovert, 125 solumedrol and 2g magnesium given in route. Pt very obese. Bs 212. Nonproductive cough.

## 2022-08-25 ENCOUNTER — Emergency Department (HOSPITAL_COMMUNITY): Payer: No Typology Code available for payment source

## 2022-08-25 ENCOUNTER — Encounter (HOSPITAL_COMMUNITY): Payer: Self-pay | Admitting: Internal Medicine

## 2022-08-25 DIAGNOSIS — F321 Major depressive disorder, single episode, moderate: Secondary | ICD-10-CM | POA: Diagnosis not present

## 2022-08-25 DIAGNOSIS — J4541 Moderate persistent asthma with (acute) exacerbation: Secondary | ICD-10-CM | POA: Diagnosis not present

## 2022-08-25 DIAGNOSIS — J9622 Acute and chronic respiratory failure with hypercapnia: Secondary | ICD-10-CM

## 2022-08-25 DIAGNOSIS — J9621 Acute and chronic respiratory failure with hypoxia: Secondary | ICD-10-CM | POA: Diagnosis not present

## 2022-08-25 DIAGNOSIS — R0602 Shortness of breath: Principal | ICD-10-CM

## 2022-08-25 LAB — BRAIN NATRIURETIC PEPTIDE: B Natriuretic Peptide: 13.6 pg/mL (ref 0.0–100.0)

## 2022-08-25 LAB — RESPIRATORY PANEL BY PCR

## 2022-08-25 LAB — I-STAT VENOUS BLOOD GAS, ED
Acid-Base Excess: 5 mmol/L — ABNORMAL HIGH (ref 0.0–2.0)
Bicarbonate: 34.9 mmol/L — ABNORMAL HIGH (ref 20.0–28.0)
Calcium, Ion: 1.21 mmol/L (ref 1.15–1.40)
HCT: 39 % (ref 36.0–46.0)
Hemoglobin: 13.3 g/dL (ref 12.0–15.0)
O2 Saturation: 100 %
Potassium: 4.5 mmol/L (ref 3.5–5.1)
Sodium: 136 mmol/L (ref 135–145)
TCO2: 37 mmol/L — ABNORMAL HIGH (ref 22–32)
pCO2, Ven: 78.8 mmHg (ref 44–60)
pH, Ven: 7.255 (ref 7.25–7.43)
pO2, Ven: 212 mmHg — ABNORMAL HIGH (ref 32–45)

## 2022-08-25 LAB — BLOOD GAS, VENOUS
Acid-Base Excess: 7.8 mmol/L — ABNORMAL HIGH (ref 0.0–2.0)
Bicarbonate: 36.1 mmol/L — ABNORMAL HIGH (ref 20.0–28.0)
Drawn by: 62681
O2 Saturation: 81.9 %
Patient temperature: 37
pCO2, Ven: 67 mmHg — ABNORMAL HIGH (ref 44–60)
pH, Ven: 7.34 (ref 7.25–7.43)
pO2, Ven: 49 mmHg — ABNORMAL HIGH (ref 32–45)

## 2022-08-25 LAB — GLUCOSE, CAPILLARY: Glucose-Capillary: 326 mg/dL — ABNORMAL HIGH (ref 70–99)

## 2022-08-25 LAB — COMPREHENSIVE METABOLIC PANEL
ALT: 26 U/L (ref 0–44)
AST: 26 U/L (ref 15–41)
Albumin: 3 g/dL — ABNORMAL LOW (ref 3.5–5.0)
Alkaline Phosphatase: 73 U/L (ref 38–126)
Anion gap: 9 (ref 5–15)
BUN: 8 mg/dL (ref 6–20)
CO2: 30 mmol/L (ref 22–32)
Calcium: 8.8 mg/dL — ABNORMAL LOW (ref 8.9–10.3)
Chloride: 97 mmol/L — ABNORMAL LOW (ref 98–111)
Creatinine, Ser: 0.57 mg/dL (ref 0.44–1.00)
GFR, Estimated: >60 mL/min (ref >60)
Glucose, Bld: 208 mg/dL — ABNORMAL HIGH (ref 70–99)
Potassium: 4.4 mmol/L (ref 3.5–5.1)
Sodium: 136 mmol/L (ref 135–145)
Total Bilirubin: 0.3 mg/dL (ref 0.3–1.2)
Total Protein: 7.6 g/dL (ref 6.5–8.1)

## 2022-08-25 LAB — CBC WITH DIFFERENTIAL/PLATELET
Abs Immature Granulocytes: 0.06 10*3/uL (ref 0.00–0.07)
Basophils Absolute: 0 10*3/uL (ref 0.0–0.1)
Basophils Relative: 1 %
Eosinophils Absolute: 0.1 10*3/uL (ref 0.0–0.5)
Eosinophils Relative: 2 %
HCT: 38.6 % (ref 36.0–46.0)
Hemoglobin: 11.4 g/dL — ABNORMAL LOW (ref 12.0–15.0)
Immature Granulocytes: 1 %
Lymphocytes Relative: 18 %
Lymphs Abs: 1.3 10*3/uL (ref 0.7–4.0)
MCH: 25.7 pg — ABNORMAL LOW (ref 26.0–34.0)
MCHC: 29.5 g/dL — ABNORMAL LOW (ref 30.0–36.0)
MCV: 87.1 fL (ref 80.0–100.0)
Monocytes Absolute: 0.4 10*3/uL (ref 0.1–1.0)
Monocytes Relative: 6 %
Neutro Abs: 5.3 10*3/uL (ref 1.7–7.7)
Neutrophils Relative %: 72 %
Platelets: 274 10*3/uL (ref 150–400)
RBC: 4.43 MIL/uL (ref 3.87–5.11)
RDW: 14.8 % (ref 11.5–15.5)
WBC: 7.2 10*3/uL (ref 4.0–10.5)
nRBC: 0 % (ref 0.0–0.2)

## 2022-08-25 LAB — RESP PANEL BY RT-PCR (RSV, FLU A&B, COVID)  RVPGX2
Influenza A by PCR: NEGATIVE
Influenza B by PCR: NEGATIVE
Resp Syncytial Virus by PCR: NEGATIVE
SARS Coronavirus 2 by RT PCR: NEGATIVE

## 2022-08-25 LAB — CBG MONITORING, ED
Glucose-Capillary: 299 mg/dL — ABNORMAL HIGH (ref 70–99)
Glucose-Capillary: 277 mg/dL — ABNORMAL HIGH (ref 70–99)
Glucose-Capillary: 320 mg/dL — ABNORMAL HIGH (ref 70–99)
Glucose-Capillary: 296 mg/dL — ABNORMAL HIGH (ref 70–99)

## 2022-08-25 LAB — CBC
HCT: 39.3 % (ref 36.0–46.0)
Hemoglobin: 11.5 g/dL — ABNORMAL LOW (ref 12.0–15.0)
MCH: 25.5 pg — ABNORMAL LOW (ref 26.0–34.0)
MCHC: 29.3 g/dL — ABNORMAL LOW (ref 30.0–36.0)
MCV: 87.1 fL (ref 80.0–100.0)
Platelets: 284 10*3/uL (ref 150–400)
RBC: 4.51 MIL/uL (ref 3.87–5.11)
RDW: 14.8 % (ref 11.5–15.5)
WBC: 9.4 10*3/uL (ref 4.0–10.5)
nRBC: 0 % (ref 0.0–0.2)

## 2022-08-25 LAB — HIV ANTIBODY (ROUTINE TESTING W REFLEX): HIV Screen 4th Generation wRfx: NONREACTIVE

## 2022-08-25 LAB — CREATININE, SERUM
Creatinine, Ser: 0.7 mg/dL (ref 0.44–1.00)
GFR, Estimated: >60 mL/min (ref >60)

## 2022-08-25 LAB — LACTIC ACID, PLASMA: Lactic Acid, Venous: 1 mmol/L (ref 0.5–1.9)

## 2022-08-25 LAB — TROPONIN I (HIGH SENSITIVITY)
Troponin I (High Sensitivity): 6 ng/L (ref <18)
Troponin I (High Sensitivity): 7 ng/L (ref <18)

## 2022-08-25 MED ORDER — IPRATROPIUM-ALBUTEROL 0.5-2.5 (3) MG/3ML IN SOLN
3.0000 mL | Freq: Four times a day (QID) | RESPIRATORY_TRACT | Status: AC
  Administered 2022-08-25 – 2022-08-26 (×4): 3 mL via RESPIRATORY_TRACT
  Filled 2022-08-25 (×4): qty 3

## 2022-08-25 MED ORDER — ROSUVASTATIN CALCIUM 20 MG PO TABS
20.0000 mg | ORAL_TABLET | Freq: Every day | ORAL | Status: DC
  Administered 2022-08-25 – 2022-08-27 (×3): 20 mg via ORAL
  Filled 2022-08-25 (×3): qty 1

## 2022-08-25 MED ORDER — PROPRANOLOL HCL 10 MG PO TABS
10.0000 mg | ORAL_TABLET | Freq: Two times a day (BID) | ORAL | Status: DC
  Administered 2022-08-25 – 2022-08-27 (×5): 10 mg via ORAL
  Filled 2022-08-25 (×5): qty 1

## 2022-08-25 MED ORDER — ALBUTEROL SULFATE (2.5 MG/3ML) 0.083% IN NEBU: 2.5000 mg | INHALATION_SOLUTION | RESPIRATORY_TRACT | Status: DC | PRN

## 2022-08-25 MED ORDER — HYDRALAZINE HCL 20 MG/ML IJ SOLN
10.0000 mg | Freq: Once | INTRAMUSCULAR | Status: AC
  Administered 2022-08-25: 10 mg via INTRAVENOUS
  Filled 2022-08-25: qty 1

## 2022-08-25 MED ORDER — METHYLPREDNISOLONE SODIUM SUCC 125 MG IJ SOLR
120.0000 mg | Freq: Every day | INTRAMUSCULAR | Status: DC
  Administered 2022-08-25 – 2022-08-26 (×2): 120 mg via INTRAVENOUS
  Filled 2022-08-25 (×2): qty 2

## 2022-08-25 MED ORDER — HYDRALAZINE HCL 20 MG/ML IJ SOLN: 10.0000 mg | Freq: Four times a day (QID) | INTRAMUSCULAR | Status: DC | PRN

## 2022-08-25 MED ORDER — ENOXAPARIN SODIUM 80 MG/0.8ML IJ SOSY
80.0000 mg | PREFILLED_SYRINGE | INTRAMUSCULAR | Status: DC
  Administered 2022-08-25 – 2022-08-27 (×3): 80 mg via SUBCUTANEOUS
  Filled 2022-08-25 (×3): qty 0.8

## 2022-08-25 MED ORDER — MOMETASONE FURO-FORMOTEROL FUM 200-5 MCG/ACT IN AERO
2.0000 | INHALATION_SPRAY | Freq: Two times a day (BID) | RESPIRATORY_TRACT | Status: DC
  Administered 2022-08-25 – 2022-08-27 (×4): 2 via RESPIRATORY_TRACT
  Filled 2022-08-25 (×3): qty 8.8

## 2022-08-25 MED ORDER — ONDANSETRON HCL 4 MG/2ML IJ SOLN
4.0000 mg | Freq: Four times a day (QID) | INTRAMUSCULAR | Status: DC | PRN
  Filled 2022-08-25: qty 2

## 2022-08-25 MED ORDER — IPRATROPIUM-ALBUTEROL 0.5-2.5 (3) MG/3ML IN SOLN: 3.0000 mL | RESPIRATORY_TRACT | Status: DC | PRN

## 2022-08-25 MED ORDER — INSULIN ASPART 100 UNIT/ML IJ SOLN
0.0000 [IU] | Freq: Every day | INTRAMUSCULAR | Status: DC
  Administered 2022-08-25: 4 [IU] via SUBCUTANEOUS
  Administered 2022-08-26: 5 [IU] via SUBCUTANEOUS

## 2022-08-25 MED ORDER — METHYLPREDNISOLONE SODIUM SUCC 125 MG IJ SOLR: 120.0000 mg | Freq: Two times a day (BID) | INTRAMUSCULAR | Status: DC

## 2022-08-25 MED ORDER — OXYCODONE HCL 5 MG PO TABS
5.0000 mg | ORAL_TABLET | Freq: Three times a day (TID) | ORAL | Status: AC | PRN
  Administered 2022-08-25 (×2): 5 mg via ORAL
  Filled 2022-08-25 (×2): qty 1

## 2022-08-25 MED ORDER — ACETAMINOPHEN 325 MG PO TABS
650.0000 mg | ORAL_TABLET | Freq: Four times a day (QID) | ORAL | Status: DC | PRN
  Administered 2022-08-25 – 2022-08-27 (×5): 650 mg via ORAL
  Filled 2022-08-25 (×5): qty 2

## 2022-08-25 MED ORDER — SODIUM CHLORIDE 0.9% FLUSH
3.0000 mL | Freq: Two times a day (BID) | INTRAVENOUS | Status: DC
  Administered 2022-08-25 – 2022-08-26 (×3): 3 mL via INTRAVENOUS

## 2022-08-25 MED ORDER — INSULIN ASPART 100 UNIT/ML IJ SOLN
0.0000 [IU] | Freq: Three times a day (TID) | INTRAMUSCULAR | Status: DC
  Administered 2022-08-25: 11 [IU] via SUBCUTANEOUS
  Administered 2022-08-25: 15 [IU] via SUBCUTANEOUS
  Administered 2022-08-26: 11 [IU] via SUBCUTANEOUS
  Administered 2022-08-26: 15 [IU] via SUBCUTANEOUS
  Administered 2022-08-26 – 2022-08-27 (×2): 20 [IU] via SUBCUTANEOUS
  Administered 2022-08-27: 15 [IU] via SUBCUTANEOUS
  Administered 2022-08-27: 7 [IU] via SUBCUTANEOUS

## 2022-08-25 MED ORDER — ONDANSETRON HCL 4 MG PO TABS: 4.0000 mg | ORAL_TABLET | Freq: Four times a day (QID) | ORAL | Status: DC | PRN

## 2022-08-25 MED ORDER — INSULIN GLARGINE-YFGN 100 UNIT/ML ~~LOC~~ SOLN
25.0000 [IU] | Freq: Every day | SUBCUTANEOUS | Status: DC
  Administered 2022-08-25 – 2022-08-26 (×2): 25 [IU] via SUBCUTANEOUS
  Filled 2022-08-25 (×2): qty 0.25

## 2022-08-25 MED ORDER — BUDESONIDE 0.5 MG/2ML IN SUSP
0.5000 mg | Freq: Two times a day (BID) | RESPIRATORY_TRACT | Status: DC
  Administered 2022-08-25 – 2022-08-27 (×5): 0.5 mg via RESPIRATORY_TRACT
  Filled 2022-08-25 (×5): qty 2

## 2022-08-25 MED ORDER — FUROSEMIDE 10 MG/ML IJ SOLN
40.0000 mg | Freq: Once | INTRAMUSCULAR | Status: AC
  Administered 2022-08-25: 40 mg via INTRAVENOUS
  Filled 2022-08-25: qty 4

## 2022-08-25 MED ORDER — ACETAMINOPHEN 650 MG RE SUPP: 650.0000 mg | Freq: Four times a day (QID) | RECTAL | Status: DC | PRN

## 2022-08-25 NOTE — Inpatient Diabetes Management (Signed)
Inpatient Diabetes Program Recommendations  AACE/ADA: New Consensus Statement on Inpatient Glycemic Control (2015)  Target Ranges:  Prepandial:   less than 140 mg/dL      Peak postprandial:   less than 180 mg/dL (1-2 hours)      Critically ill patients:  140 - 180 mg/dL   Lab Results  Component Value Date   GLUCAP 277 (H) 08/25/2022   HGBA1C 7.7 (A) 07/01/2022    Review of Glycemic Control  Latest Reference Range & Units 08/25/22 08:05 08/25/22 11:13  Glucose-Capillary 70 - 99 mg/dL 299 (H) 277 (H)    Diabetes history: DM 2 Outpatient Diabetes medications: Ozempic 0.25 mg weekly, Lantus 25 units Daily Current orders for Inpatient glycemic control:  Semglee 25 units Daily Novolog 0-20 units tid + hs  Solumedrol 120 mg Daily  Inpatient Diabetes Program Recommendations:    -   while on steroids also consider Novolog 4 units tid meal coverage if eating >50% of meals.  Thanks,  Tama Headings RN, MSN, BC-ADM Inpatient Diabetes Coordinator Team Pager [phone] (8a-5p)

## 2022-08-25 NOTE — ED Notes (Signed)
ED TO INPATIENT HANDOFF REPORT  ED Nurse Name and Phone #: Gaylord Shih [phone]  S Name/Age/Gender Yvette Gibbs 32 y.o. female Room/Bed: 008C/008C  Code Status   Code Status: Full Code  Home/SNF/Other Home Patient oriented to: self, place, time, and situation Is this baseline? No   Triage Complete: Triage complete  Chief Complaint Asthma exacerbation [J45.901] Acute asthma exacerbation [J45.901]  Triage Note Pt from w/asthma exacerbation. C/o chest tightness and slight sob. 10 of albuterol, '1mg'$  atrovert, 125 solumedrol and 2g magnesium given in route. Pt very obese. Bs 212. Nonproductive cough.   Allergies Allergies  Allergen Reactions   Bee Pollen Other (See Comments)    Sneezing and eye burning   Fish Allergy Itching and Rash   Lisinopril Rash    Caused a rash and caused her to sweat a lot.   Metformin And Related Itching and Rash    Level of Care/Admitting Diagnosis ED Disposition     ED Disposition  Admit   Condition  --   [place]: [place] [100100]  Level of Care: Progressive [102]  Admit to Progressive based on following criteria: RESPIRATORY PROBLEMS hypoxemic/hypercapnic respiratory failure that is responsive to NIPPV (BiPAP) or High Flow Nasal Cannula (6-80 lpm). Frequent assessment/intervention, no > Q2 hrs < Q4 hrs, to maintain oxygenation and pulmonary hygiene.  May place patient in observation at [facility] or [facility] if equivalent level of care is available:: Yes  Covid Evaluation: Confirmed COVID Negative  Diagnosis: Acute asthma exacerbation [id number]  Admitting Physician: Modena Jansky [place]  Attending Physician: Vernell Leep D [[id number]]          B Medical/Surgery History Past Medical History:  Diagnosis Date   Acute exacerbation of CHF (congestive heart failure) ([place]) 05/04/2021   Asthma    Depression    Diabetes mellitus type 2, uncontrolled    Extreme obesity    Family history of  adverse reaction to anesthesia    " my grandmother had an allergic reaction   Hypertension    Mental disorder    Obesity    OSA (obstructive sleep apnea)    Past Surgical History:  Procedure Laterality Date   APPENDECTOMY     CHOLECYSTECTOMY N/A 09/05/2014   Procedure: LAPAROSCOPIC CHOLECYSTECTOMY;  Surgeon: Ralene Ok, MD;  Location: [facility];  Service: General;  Laterality: N/A;   TONSILLECTOMY       A IV Location/Drains/Wounds Patient Lines/Drains/Airways Status     Active Line/Drains/Airways     Name Placement date Placement time Site Days   Peripheral IV 08/24/22 18 G Left Antecubital 08/24/22  2230  Antecubital  1            Intake/Output Last 24 hours  Intake/Output Summary (Last 24 hours) at 08/25/2022 1644 Last data filed at 08/25/2022 0902 Gross per 24 hour  Intake --  Output 1200 ml  Net -1200 ml    Labs/Imaging Results for orders placed or performed during the hospital encounter of 08/24/22 (from the past 48 hour(s))  CBC with Differential/Platelet     Status: Abnormal   Collection Time: 08/24/22 11:45 PM  Result Value Ref Range   WBC 7.2 4.0 - 10.5 K/uL   RBC 4.43 3.87 - 5.11 MIL/uL   Hemoglobin 11.4 (L) 12.0 - 15.0 g/dL   HCT 38.6 36.0 - 46.0 %   MCV 87.1 80.0 - 100.0 fL   MCH 25.7 (L) 26.0 - 34.0 pg   MCHC 29.5 (L) 30.0 - 36.0  g/dL   RDW 14.8 11.5 - 15.5 %   Platelets 274 150 - 400 K/uL   nRBC 0.0 0.0 - 0.2 %   Neutrophils Relative % 72 %   Neutro Abs 5.3 1.7 - 7.7 K/uL   Lymphocytes Relative 18 %   Lymphs Abs 1.3 0.7 - 4.0 K/uL   Monocytes Relative 6 %   Monocytes Absolute 0.4 0.1 - 1.0 K/uL   Eosinophils Relative 2 %   Eosinophils Absolute 0.1 0.0 - 0.5 K/uL   Basophils Relative 1 %   Basophils Absolute 0.0 0.0 - 0.1 K/uL   Immature Granulocytes 1 %   Abs Immature Granulocytes 0.06 0.00 - 0.07 K/uL    Comment: Performed at [facility] [address], [place], [place] [address]  Comprehensive metabolic panel     Status:  Abnormal   Collection Time: 08/24/22 11:45 PM  Result Value Ref Range   Sodium 136 135 - 145 mmol/L   Potassium 4.4 3.5 - 5.1 mmol/L   Chloride 97 (L) 98 - 111 mmol/L   CO2 30 22 - 32 mmol/L   Glucose, Bld 208 (H) 70 - 99 mg/dL    Comment: Glucose reference range applies only to samples taken after fasting for at least 8 hours.   BUN 8 6 - 20 mg/dL   Creatinine, Ser 0.57 0.44 - 1.00 mg/dL   Calcium 8.8 (L) 8.9 - 10.3 mg/dL   Total Protein 7.6 6.5 - 8.1 g/dL   Albumin 3.0 (L) 3.5 - 5.0 g/dL   AST 26 15 - 41 U/L   ALT 26 0 - 44 U/L   Alkaline Phosphatase 73 38 - 126 U/L   Total Bilirubin 0.3 0.3 - 1.2 mg/dL   GFR, Estimated >60 >60 mL/min    Comment: (NOTE) Calculated using the CKD-EPI Creatinine Equation (2021)    Anion gap 9 5 - 15    Comment: Performed at [facility], [place] [address] [place], [place] [address]  Lactic acid, plasma     Status: None   Collection Time: 08/24/22 11:45 PM  Result Value Ref Range   Lactic Acid, Venous 1.0 0.5 - 1.9 mmol/L    Comment: Performed at [facility] [address], [place], [place] [address]  Troponin I (High Sensitivity)     Status: None   Collection Time: 08/24/22 11:45 PM  Result Value Ref Range   Troponin I (High Sensitivity) 7 <18 ng/L    Comment: (NOTE) Elevated high sensitivity troponin I (hsTnI) values and significant  changes across serial measurements may suggest ACS but many other  chronic and acute conditions are known to elevate hsTnI results.  Refer to the "Links" section for chest pain algorithms and additional  guidance. Performed at [facility], [place] [address], [place], [place] [address]   I-Stat venous blood gas, [facility], [facility], [facility])     Status: Abnormal   Collection Time: 08/25/22  1:01 AM  Result Value Ref Range   pH, Ven 7.255 7.25 - 7.43   pCO2, Ven 78.8 (HH) 44 - 60 mmHg   pO2, Ven 212 (H) 32 - 45 mmHg   Bicarbonate 34.9 (H) 20.0 - 28.0 mmol/L   TCO2 37 (H) 22 - 32 mmol/L   O2 Saturation  100 %   Acid-Base Excess 5.0 (H) 0.0 - 2.0 mmol/L   Sodium 136 135 - 145 mmol/L   Potassium 4.5 3.5 - 5.1 mmol/L   Calcium, Ion 1.21 1.15 -  1.40 mmol/L   HCT 39.0 36.0 - 46.0 %   Hemoglobin 13.3 12.0 - 15.0 g/dL   Sample type VENOUS    Comment NOTIFIED PHYSICIAN   Troponin I (High Sensitivity)     Status: None   Collection Time: 08/25/22  2:05 AM  Result Value Ref Range   Troponin I (High Sensitivity) 6 <18 ng/L    Comment: (NOTE) Elevated high sensitivity troponin I (hsTnI) values and significant  changes across serial measurements may suggest ACS but many other  chronic and acute conditions are known to elevate hsTnI results.  Refer to the "Links" section for chest pain algorithms and additional  guidance. Performed at [facility], [place] [address] [place], [place] [address]   Resp panel by RT-PCR (RSV, Flu A&B, Covid) Anterior Nasal Swab     Status: None   Collection Time: 08/25/22  2:54 AM   Specimen: Anterior Nasal Swab  Result Value Ref Range   SARS Coronavirus 2 by RT PCR NEGATIVE NEGATIVE   Influenza A by PCR NEGATIVE NEGATIVE   Influenza B by PCR NEGATIVE NEGATIVE    Comment: (NOTE) The Xpert Xpress SARS-CoV-2/FLU/RSV plus assay is intended as an aid in the diagnosis of influenza from Nasopharyngeal swab specimens and should not be used as a sole basis for treatment. Nasal washings and aspirates are unacceptable for Xpert Xpress SARS-CoV-2/FLU/RSV testing.  Fact Sheet for Patients: EntrepreneurPulse.com.au  Fact Sheet for Healthcare Providers: IncredibleEmployment.be  This test is not yet approved or cleared by the [place] FDA and has been authorized for detection and/or diagnosis of SARS-CoV-2 by FDA under an Emergency Use Authorization (EUA). This EUA will remain in effect (meaning this test can be used) for the duration of the COVID-19 declaration under Section 564(b)(1) of the Act, 21 U.S.C. section  360bbb-3(b)(1), unless the authorization is terminated or revoked.     Resp Syncytial Virus by PCR NEGATIVE NEGATIVE    Comment: (NOTE) Fact Sheet for Patients: EntrepreneurPulse.com.au  Fact Sheet for Healthcare Providers: IncredibleEmployment.be  This test is not yet approved or cleared by the [place] FDA and has been authorized for detection and/or diagnosis of SARS-CoV-2 by FDA under an Emergency Use Authorization (EUA). This EUA will remain in effect (meaning this test can be used) for the duration of the COVID-19 declaration under Section 564(b)(1) of the Act, 21 U.S.C. section 360bbb-3(b)(1), unless the authorization is terminated or revoked.  Performed at [facility], [place] [address], [place], [place] [address]   Brain natriuretic peptide     Status: None   Collection Time: 08/25/22  3:00 AM  Result Value Ref Range   B Natriuretic Peptide 13.6 0.0 - 100.0 pg/mL    Comment: Performed at [facility] [address], [place], [place] [address]  HIV Antibody (routine testing w rflx)     Status: None   Collection Time: 08/25/22  8:05 AM  Result Value Ref Range   HIV Screen 4th Generation wRfx Non Reactive Non Reactive    Comment: Performed at [facility], [place] [address], [place] [address]  CBC     Status: Abnormal   Collection Time: 08/25/22  8:05 AM  Result Value Ref Range   WBC 9.4 4.0 - 10.5 K/uL   RBC 4.51 3.87 - 5.11 MIL/uL   Hemoglobin 11.5 (L) 12.0 - 15.0 g/dL   HCT 39.3 36.0 - 46.0 %   MCV 87.1 80.0 - 100.0 fL   MCH 25.5 (  L) 26.0 - 34.0 pg   MCHC 29.3 (L) 30.0 - 36.0 g/dL   RDW 14.8 11.5 - 15.5 %   Platelets 284 150 - 400 K/uL   nRBC 0.0 0.0 - 0.2 %    Comment: Performed at [facility], [place] [address] [place], [place] [address]  Creatinine, serum     Status: None   Collection Time: 08/25/22  8:05 AM  Result Value Ref Range   Creatinine, Ser 0.70 0.44 - 1.00 mg/dL   GFR,  Estimated >60 >60 mL/min    Comment: (NOTE) Calculated using the CKD-EPI Creatinine Equation (2021) Performed at [facility] [address], [place], [place] [address]   Blood gas, venous     Status: Abnormal   Collection Time: 08/25/22  8:05 AM  Result Value Ref Range   pH, Ven 7.34 7.25 - 7.43   pCO2, Ven 67 (H) 44 - 60 mmHg   pO2, Ven 49 (H) 32 - 45 mmHg   Bicarbonate 36.1 (H) 20.0 - 28.0 mmol/L   Acid-Base Excess 7.8 (H) 0.0 - 2.0 mmol/L   O2 Saturation 81.9 %   Patient temperature 37.0    Drawn by [id number]     Comment: Performed at [facility] [address] [place], [place] [address]  CBG monitoring, ED     Status: Abnormal   Collection Time: 08/25/22  8:05 AM  Result Value Ref Range   Glucose-Capillary 299 (H) 70 - 99 mg/dL    Comment: Glucose reference range applies only to samples taken after fasting for at least 8 hours.  CBG monitoring, ED     Status: Abnormal   Collection Time: 08/25/22 11:13 AM  Result Value Ref Range   Glucose-Capillary 277 (H) 70 - 99 mg/dL    Comment: Glucose reference range applies only to samples taken after fasting for at least 8 hours.  CBG monitoring, ED     Status: Abnormal   Collection Time: 08/25/22  3:01 PM  Result Value Ref Range   Glucose-Capillary 320 (H) 70 - 99 mg/dL    Comment: Glucose reference range applies only to samples taken after fasting for at least 8 hours.   CT HEAD WO CONTRAST (5MM)  Result Date: 08/25/2022 CLINICAL DATA:  Mental status change, unknown cause EXAM: CT HEAD WITHOUT CONTRAST TECHNIQUE: Contiguous axial images were obtained from the base of the skull through the vertex without intravenous contrast. RADIATION DOSE REDUCTION: This exam was performed according to the departmental dose-optimization program which includes automated exposure control, adjustment of the mA and/or kV according to patient size and/or use of iterative reconstruction technique. COMPARISON:  None Available. FINDINGS: Brain:  No evidence of large-territorial acute infarction. No parenchymal hemorrhage. No mass lesion. No extra-axial collection. No mass effect or midline shift. No hydrocephalus. Basilar cisterns are patent. Vascular: No hyperdense vessel. Skull: No acute fracture or focal lesion. Sinuses/Orbits: Paranasal sinuses and mastoid air cells are clear. The orbits are unremarkable. Other: None. IMPRESSION: No acute intracranial abnormality. Electronically Signed   By: Iven Finn M.D.   On: 08/25/2022 02:07   DG Chest Port 1 View  Result Date: 08/24/2022 CLINICAL DATA:  Short of breath EXAM: PORTABLE CHEST 1 VIEW COMPARISON:  07/23/2022 FINDINGS: Single frontal view of the chest demonstrates stable enlargement of the cardiac silhouette. Lung volumes are diminished, with increased central vascular congestion since prior exam. Patchy bilateral perihilar airspace disease. No effusion or pneumothorax. IMPRESSION: 1. Increased vascular congestion, with bilateral perihilar  airspace disease most consistent with edema. 2. Stable enlarged cardiac silhouette. Electronically Signed   By: Randa Ngo M.D.   On: 08/24/2022 23:33    Pending Labs Unresulted Labs (From admission, onward)     Start     Ordered   09/01/22 0500  Creatinine, serum  (enoxaparin (LOVENOX)    CrCl >/= 30 ml/min)  Weekly,   R     Comments: while on enoxaparin therapy    08/25/22 0629   08/26/22 [id number]  Basic metabolic panel  Tomorrow morning,   R        08/25/22 0629   08/26/22 0500  CBC  Tomorrow morning,   R        08/25/22 0629   08/25/22 1307  Respiratory (~20 pathogens) panel by PCR  (Respiratory panel by PCR (~20 pathogens, ~24 hr TAT)  w precautions)  Once,   R        08/25/22 1307   08/25/22 0627  Hemoglobin A1c  Once,   R       Comments: To assess prior glycemic control    08/25/22 0626            Vitals/Pain Today's Vitals   08/25/22 1414 08/25/22 1415 08/25/22 1430 08/25/22 1445  BP:      Pulse:  100 99 (!) 103  Resp:   (!) 22 (!) 34 (!) 22  Temp:      TempSrc:      SpO2:  97% 96% 100%  Weight:      Height:      PainSc: 0-No pain       Isolation Precautions Droplet precaution  Medications Medications  propranolol (INDERAL) tablet 10 mg (10 mg Oral Given 08/25/22 0945)  rosuvastatin (CRESTOR) tablet 20 mg (20 mg Oral Given 08/25/22 0945)  insulin glargine-yfgn (SEMGLEE) injection 25 Units (25 Units Subcutaneous Given 08/25/22 0945)  ipratropium-albuterol (DUONEB) 0.5-2.5 (3) MG/3ML nebulizer solution 3 mL (3 mLs Nebulization Given 08/25/22 1436)  budesonide (PULMICORT) nebulizer solution 0.5 mg (0.5 mg Nebulization Given 08/25/22 0737)  insulin aspart (novoLOG) injection 0-20 Units (15 Units Subcutaneous Given 08/25/22 1510)  insulin aspart (novoLOG) injection 0-5 Units (has no administration in time range)  enoxaparin (LOVENOX) injection 80 mg (80 mg Subcutaneous Given 08/25/22 0945)  sodium chloride flush (NS) 0.9 % injection 3 mL (3 mLs Intravenous Given 08/25/22 0945)  acetaminophen (TYLENOL) tablet 650 mg (650 mg Oral Given 08/25/22 0945)    Or  acetaminophen (TYLENOL) suppository 650 mg ( Rectal See Alternative 08/25/22 0945)  ondansetron (ZOFRAN) tablet 4 mg (has no administration in time range)    Or  ondansetron (ZOFRAN) injection 4 mg (has no administration in time range)  methylPREDNISolone sodium succinate (SOLU-MEDROL) 125 mg/2 mL injection 120 mg (120 mg Intravenous Given 08/25/22 0806)  mometasone-formoterol (DULERA) 200-5 MCG/ACT inhaler 2 puff (2 puffs Inhalation Given 08/25/22 0943)  hydrALAZINE (APRESOLINE) injection 10 mg (has no administration in time range)  albuterol (PROVENTIL) (2.5 MG/3ML) 0.083% nebulizer solution 2.5 mg (has no administration in time range)  oxyCODONE (Oxy IR/ROXICODONE) immediate release tablet 5 mg (5 mg Oral Given 08/25/22 1256)  hydrALAZINE (APRESOLINE) injection 10 mg (10 mg Intravenous Given 08/25/22 0138)  furosemide (LASIX) injection 40 mg (40 mg Intravenous  Given 08/25/22 0806)    Mobility walks     Focused Assessments Pulmonary Assessment Handoff:  Lung sounds: Bilateral Breath Sounds: Diminished, Expiratory wheezes L Breath Sounds: Expiratory wheezes, Inspiratory wheezes R Breath Sounds: Expiratory wheezes, Inspiratory wheezes O2 Device:  Nasal Cannula O2 Flow Rate (L/min): 3 L/min    R Recommendations: See Admitting Provider Note  Report given to:  Marland Kitchen Additional Notes: .

## 2022-08-25 NOTE — ED Notes (Signed)
Breakfast tray has still not shown up for the patient; unit secretary has made two calls down to dietary since 0845 trying to get a tray delivered for the patient. Patient was updated and offered snacks/food that was available in the ED. Pt declined at this time

## 2022-08-25 NOTE — H&P (Signed)
History and Physical    Patient: Yvette Gibbs [id number] DOB: October 10, 1990 DOA: 08/24/2022 DOS: the patient was seen and examined on 08/25/2022 PCP: Kerin Perna, NP  Patient coming from: Home-lives with sister and niece  Chief Complaint:  Chief Complaint  Patient presents with   Shortness of Breath   HPI: Yvette Gibbs is a 32 y.o. female with medical history significant of asthma, morbid obesity with a BMI of 68, diabetes mellitus 2, uncontrolled on insulin, dyslipidemia, mild hypertension, OSA and OHS, fatty liver disease.  Patient presented to the ED via EMS after developing abrupt shortness of breath with chest tightness yesterday evening.  And route she received 1 nebulizer treatment, Solu-Medrol and IV magnesium.  She reported that she has had about a 15 to 20 pound weight gain over the past few weeks.  In the ER her chest x-ray and CTA of the chest were negative for any acute process including PE.  She was hypoxemic and has required oxygen.  Her VBG did show some mild hypercarbia but patient is alert and oriented and having no increased work of breathing.  Hospitalist team consulted to evaluate the patient for admission.  Currently patient is improved from time of presentation but she still becomes short of breath and has wheezing when sitting forward in the bed for pulmonary exam.  Patient has some difficulty recalling what her pulmonary meds are at home.  She did state that she has been unable to obtain a CPAP or a BiPAP machine.  She does state that she uses her rescue inhaler at least 4 times per week.  She was unclear as to whether she was on a preventative inhaler for her asthma.  She does not have a pulmonologist.  When asked if she had diabetes she stated no-she was reminded that she does take insulin and this is a drug given for diabetes.  She is still taking her Ozempic and does have GI symptoms with nausea and some abdominal cramping and decreased oral intake.  She  states she has not lost any weight since starting Ozempic but has not been on this medication more than 1 month.  She was recently prescribed Crestor by her PCP but states that this medication was not sent to her pharmacy.  Her pharmacy preferences were reviewed under provider navigator and there are 4 different pharmacies listed. Review of Systems: As mentioned in the history of present illness. All other systems reviewed and are negative. Past Medical History:  Diagnosis Date   Acute exacerbation of CHF (congestive heart failure) ([place]) 05/04/2021   Asthma    Depression    Diabetes mellitus type 2, uncontrolled    Extreme obesity    Family history of adverse reaction to anesthesia    " my grandmother had an allergic reaction   Hypertension    Mental disorder    Obesity    OSA (obstructive sleep apnea)    Past Surgical History:  Procedure Laterality Date   APPENDECTOMY     CHOLECYSTECTOMY N/A 09/05/2014   Procedure: LAPAROSCOPIC CHOLECYSTECTOMY;  Surgeon: Ralene Ok, MD;  Location: [place];  Service: General;  Laterality: N/A;   TONSILLECTOMY     Social History:  reports that she quit smoking about 4 years ago. Her smoking use included cigarettes. She has a 5.00 pack-year smoking history. She has never used smokeless tobacco. She reports that she does not currently use alcohol. She reports that she does not currently use drugs after having used the following drugs: Marijuana.  Allergies  Allergen Reactions   Bee Pollen Other (See Comments)    Sneezing and eye burning   Fish Allergy Itching and Rash   Lisinopril Rash    Caused a rash and caused her to sweat a lot.   Metformin And Related Itching and Rash    Family History  Problem Relation Age of Onset   Diabetes Mellitus II Maternal Grandmother     Prior to Admission medications   Medication Sig Start Date End Date Taking? Authorizing Provider  budesonide-formoterol (SYMBICORT) 160-4.5 MCG/ACT inhaler Inhale 2 puffs into  the lungs 2 (two) times daily. 07/01/22   Kerin Perna, NP  GLOBAL EASE INJECT PEN NEEDLES 32G X 4 MM MISC USE TO INJECT lantus EVERY DAY AS DIRECTED 07/03/22   Kerin Perna, NP  insulin glargine (LANTUS SOLOSTAR) 100 UNIT/ML Solostar Pen Inject 25 Units into the skin daily. 07/01/22   Kerin Perna, NP  propranolol (INDERAL) 10 MG tablet Take 1 tablet (10 mg total) by mouth 2 (two) times daily. 07/01/22   Kerin Perna, NP  rosuvastatin (CRESTOR) 20 MG tablet Take 1 tablet (20 mg total) by mouth daily. 07/06/22   Kerin Perna, NP  Semaglutide,0.25 or 0.'5MG'$ /DOS, (OZEMPIC, 0.25 OR 0.5 MG/DOSE,) 2 MG/3ML SOPN Inject 0.25 mg into the skin once a week. For 4 weeks. Then, increase to 0.5 mg into the skin once a week thereafter. 07/01/22   Charlott Rakes, MD    Physical Exam: Vitals:   08/25/22 0400 08/25/22 0609 08/25/22 0737 08/25/22 0741  BP: (!) 155/91     Pulse: (!) 104  (!) 118   Resp: (!) 22  17   Temp:  98.2 F (36.8 C)    TempSrc:  Oral    SpO2: 100%  100% 99%  Weight:      Height:       Constitutional: NAD, calm, comfortable Eyes: PERRL, lids and conjunctivae normal ENMT: Mucous membranes are moist. Posterior pharynx clear of any exudate or lesions.Normal dentition.  Neck: normal, supple, no masses, no thyromegaly Respiratory: Body habitus limits exam to a degree.  Has faint expiratory wheezing on anterior exam, when patient sat forward for posterior exam noticed audible upper airway wheezing and increased work of breathing.  Bilateral wheezing noted and somewhat diminished in the bases.  Stable on 3 L of oxygen with O2 sats 98 to 99% Cardiovascular: Regular rate and rhythm although does have resting tachycardia less than 110 bpm., no murmurs / rubs / gallops. No obvious extremity edema but exam limited by body habitus. 2+ pedal pulses.  Abdomen: Obese with pannus no tenderness, no masses palpated.  Given body habitus unable to appreciate if any  hepatosplenomegaly. Bowel sounds positive.  Musculoskeletal: no clubbing / cyanosis. No joint deformity upper and lower extremities. Good ROM, no contractures. Normal muscle tone.  Skin: no rashes, lesions, ulcers. No induration Neurologic: CN 2-12 grossly intact. Sensation intact, Strength 5/5 x all 4 extremities.  Psychiatric: Normal judgment and insight. Alert and oriented x 3. Normal mood.  Does seem to have some difficulty with recall regarding her past medical history and medications. Data Reviewed:  ABG reveals a pCO2 of 78.8 and a pO2 of 212. Sodium 136, potassium 4.4, chloride 97, CO2 30, glucose 208, BUN 8, creatinine 0.57, albumin 3.0, LFTs normal BNP 13.6, troponin 7 and 6 Lactic acid 1.0 WBC 7.2, hemoglobin 11.4, hematocrit 38.6, platelets 274,000 neutrophils 72 PCR for influenza, RSV and SARS negative CT of the head without  contrast no acute abnormalities Chest x-ray consistent with mild edema CTA of chest no obvious abnormalities including no PE but exam limited by body habitus.  Assessment and Plan: Acute respiratory failure with hypoxemia secondary to asthma exacerbation Stable Has had asthma since childhood; has never been intubated Oxygen at home therefore we will as tolerated to room air IV Solu-Medrol with eventual transition to prednisone taper Utilize scheduled DuoNebs 24 hours and changed to as needed Budesonide nebs every 12 hours Incentive spirometry or flutter valve Continue Dulera  No clinical signs of pneumonia: afebrile, no glucose chest x-ray or CT of the chest Repeat VBG at 1 PM  Discharge planning: Make sure patient has prescription for Symbicort MDI (covered by Medicaid), albuterol rescue inhaler and consider Singulair.  Ambulatory referral has been sent to pulmonary medicine  Morbid obesity with a BMI of 68/OHS/OSA Upon review of records patient had previously been seen by pulmonologist Dr. Halford Chessman who recommended BiPAP and or trilogy machine with an  IPAP of 25, and EPAP of 10 and pressure support of 4. Patient has been unable to obtain CPAP or BiPAP machine in the outpatient setting Will allow for BiPAP as needed and utilize CPAP and/or BiPAP at at bedtime Discussed weight reduction with patient.  She is currently on Ozempic to help both with weight reduction and diabetes management Patient reports independent ambulation without assistive devices but does slow especially up and down the stairs due to knee discomfort Unclear if would benefit from pulmonary rehab  Discharge planning: Ambulatory referral sent to pulmonary medicine; ambulatory referral sent to diabetes/nutrition clinic; [facility] consulted regarding assistance with obtaining trilogy machine upon discharge  Diabetes mellitus 2 on insulin with hyperglycemia Currently CBGs elevated in context of acute physiological stress from hyperglycemia and asthma exacerbation Follow CBGs AC/at bedtime and provide SSI Semglee 25 units daily which is substituted for patient's preadmission insulin Anticipate elevation in CBGs while on steroids Hemoglobin A1c point-of-care January 2024 was 7.7-repeat hemoglobin A1c this admission Also on Ozempic for diabetes management as well as weight reduction  Discharge planning: Ambulatory referral sent to diabetes clinic/nutrition management;TOC also consulted regarding home health RN follow-up  Hypertension Current blood pressure not well-controlled Was not on any medications prior to admission other than Inderal which typically does not control blood pressure well Inderal continued for now.  Will provide Apresoline IV as needed with parameters for administration Patient reporting weight gain of about 15 to 20 pounds over the past few weeks despite having poor appetite and intake secondary to Ozempic BNP normal but patient still could have right heart failure and/or HFpEF. Will give Lasix 40 mg IV x 1 Obtain echocardiogram noting last echocardiogram in  2022  Dyslipidemia Recently prescribed Crestor although patient states medication never sent to her pharmacy  Discharge planning: Patient has multiple pharmacies listed in her provider navigator information.  Need to clarify which pharmacy patient actually uses and will need to resend her Crestor prescription at time of discharge  Fatty liver disease Current LFTs are normal   Advance Care Planning:   Code Status: Full Code   DVT prophylaxis: Lovenox  Consults: None  Family Communication: Patient only  Severity of Illness: The appropriate patient status for this patient is OBSERVATION. Observation status is judged to be reasonable and necessary in order to provide the required intensity of service to ensure the patient's safety. The patient's presenting symptoms, physical exam findings, and initial radiographic and laboratory data in the context of their medical condition is felt to  place them at decreased risk for further clinical deterioration. Furthermore, it is anticipated that the patient will be medically stable for discharge from the hospital within 2 midnights of admission.   Author: Erin Hearing, NP 08/25/2022 8:09 AM  For on call review [email or web address].

## 2022-08-25 NOTE — Consult Note (Signed)
NAMEJnai Gibbs, MRN:  [id number], DOB:  11-04-1990, LOS: 1 ADMISSION DATE:  08/24/2022, CONSULTATION DATE:  08/25/22 REFERRING MD:  Yvette Gibbs, CHIEF COMPLAINT:  shortness of breath   History of Present Illness:  Yvette Gibbs is a 32 y.Gibbs. F with PMH significant for Asthma, OSA, OHS, type 2 DM, HTN and depression who has been followed by Dr. Halford Chessman at [facility] and had a home sleep study and PFT's in 01/2021 which showed moderate OSA and mild restrictive defect without BD response.  She then had a PAP titration study and was recommended to start Bipap 29/24, however pt was never able to obtain this and has been just using a rescue inhaler 4 times per day.  She states she has never obtained Trilogy, CPAP or Bipap at home  She presented to the ED on 3/11 after the onset of dizziness, wheezing and dyspnea on 3/10.  She has not had recent URI symptoms and does not use tobacco.  Her CXR was consistent with mild pulmonary edema, she had no leukocytosis or lactic acidosis pH 7.2 on VBG, pCO2 78, BNP 13.    She was treated with steroids, duobnebs and Lasix and PCCM consulted in the his setting.  She thinks her RLE may be slightly swollen compared to the L  Pertinent  Medical History   has a past medical history of Acute exacerbation of CHF (congestive heart failure) ([place]) (05/04/2021), Asthma, Depression, Diabetes mellitus type 2, uncontrolled, Extreme obesity, Family history of adverse reaction to anesthesia, Hypertension, Mental disorder, Obesity, and OSA (obstructive sleep apnea).   Significant Hospital Events: Including procedures, antibiotic start and stop dates in addition to other pertinent events   3/11 admitted to [facility] with shortness of breath 3/12 PCCM consult  Interim History / Subjective:  Pt stable on [place], concerned about getting meals and more comfortable bed  Objective   Blood pressure (!) 142/82, pulse (!) 102, temperature 98.2 F (36.8 C), temperature source Oral, resp.  rate 14, height '5\' 2"'$  (1.575 m), weight (!) 170.1 kg, SpO2 100 %.        Intake/Output Summary (Last 24 hours) at 08/25/2022 1131 Last data filed at 08/25/2022 [id number] Gross per 24 hour  Intake --  Output 1200 ml  Net -1200 ml   Filed Weights   08/24/22 2244  Weight: (!) 170.1 kg    General:  obese F, sitting up in bed eating lunch in no acute distress HEENT: MM pink/moist, sclera anicteric Neuro: awake, alert and oriented  CV: s1s2 rrr, no m/r/g PULM:  mildly decreased air entry bilaterally without wheezing or rhonchi, no accessory muscle use GI: soft, obese, non-tender Extremities: warm/dry, 1+ edema  Skin: no rashes or lesions   Resolved Hospital Problem list     Assessment & Plan:     Acute Hypoxic Respiratory Failure likely secondary to mild asthma exacerbation and volume overload Baseline morbid obesity, OSA and likely OHS States was never able to obtain CPAP, Bipap or Trilogy as the office did not follow up with her Low suspicion PE without tachycardia, elevated troponin or BNP -she is comfortable now on 3L [place], continue inhalers and steroids, can likely taper rapidly -will need office follow up for assistance obtaining Bipap, will request office visit through Epic  -she is comfortable here, start bipap qhs, will discuss settings with Dr. Lamonte Sakai -RVP -Lower extremity dopplers     Best Practice (right click and "Reselect all SmartList Selections" daily)   Per primary  Labs  CBC: Recent Labs  Lab 08/24/22 2345 08/25/22 0101 08/25/22 0805  WBC 7.2  --  9.4  NEUTROABS 5.3  --   --   HGB 11.4* 13.3 11.5*  HCT 38.6 39.0 39.3  MCV 87.1  --  87.1  PLT 274  --  [id number]    Basic Metabolic Panel: Recent Labs  Lab 08/24/22 2345 08/25/22 0101 08/25/22 0805  NA 136 136  --   K 4.4 4.5  --   CL 97*  --   --   CO2 30  --   --   GLUCOSE 208*  --   --   BUN 8  --   --   CREATININE 0.57  --  0.70  CALCIUM 8.8*  --   --    GFR: Estimated Creatinine Clearance:  160.7 mL/min (by C-G formula based on SCr of 0.7 mg/dL). Recent Labs  Lab 08/24/22 2345 08/25/22 0805  WBC 7.2 9.4  LATICACIDVEN 1.0  --     Liver Function Tests: Recent Labs  Lab 08/24/22 2345  AST 26  ALT 26  ALKPHOS 73  BILITOT 0.3  PROT 7.6  ALBUMIN 3.0*   No results for input(s): "LIPASE", "AMYLASE" in the last 168 hours. No results for input(s): "AMMONIA" in the last 168 hours.  ABG    Component Value Date/Time   PHART 7.277 (L) 12/17/2020 0612   PCO2ART 76.0 (HH) 12/17/2020 0612   PO2ART 91 12/17/2020 0612   HCO3 36.1 (H) 08/25/2022 0805   TCO2 37 (H) 08/25/2022 0101   O2SAT 81.9 08/25/2022 0805     Coagulation Profile: No results for input(s): "INR", "PROTIME" in the last 168 hours.  Cardiac Enzymes: No results for input(s): "CKTOTAL", "CKMB", "CKMBINDEX", "TROPONINI" in the last 168 hours.  HbA1C: HbA1c, POC (controlled diabetic range)  Date/Time Value Ref Range Status  07/01/2022 02:03 PM 7.7 (A) 0.0 - 7.0 % Final   Hgb A1c MFr Bld  Date/Time Value Ref Range Status  05/05/2021 04:57 AM 8.8 (H) 4.8 - 5.6 % Final    Comment:    (NOTE) Pre diabetes:          5.7%-6.4%  Diabetes:              >6.4%  Glycemic control for   <7.0% adults with diabetes   12/17/2020 07:19 PM 8.8 (H) 4.8 - 5.6 % Final    Comment:    (NOTE) Pre diabetes:          5.7%-6.4%  Diabetes:              >6.4%  Glycemic control for   <7.0% adults with diabetes     CBG: Recent Labs  Lab 08/25/22 0805 08/25/22 1113  GLUCAP 299* 277*    Review of Systems:   Please see the history of present illness. All other systems reviewed and are negative    Past Medical History:  She,  has a past medical history of Acute exacerbation of CHF (congestive heart failure) ([place]) (05/04/2021), Asthma, Depression, Diabetes mellitus type 2, uncontrolled, Extreme obesity, Family history of adverse reaction to anesthesia, Hypertension, Mental disorder, Obesity, and OSA (obstructive  sleep apnea).   Surgical History:   Past Surgical History:  Procedure Laterality Date   APPENDECTOMY     CHOLECYSTECTOMY N/A 09/05/2014   Procedure: LAPAROSCOPIC CHOLECYSTECTOMY;  Surgeon: Yvette Ok, MD;  Location: [place];  Service: General;  Laterality: N/A;   TONSILLECTOMY       Social History:   reports  that she quit smoking about 4 years ago. Her smoking use included cigarettes. She has a 5.00 pack-year smoking history. She has never used smokeless tobacco. She reports that she does not currently use alcohol. She reports that she does not currently use drugs after having used the following drugs: Marijuana.   Family History:  Her family history includes Diabetes Mellitus II in her maternal grandmother.   Allergies Allergies  Allergen Reactions   Bee Pollen Other (See Comments)    Sneezing and eye burning   Fish Allergy Itching and Rash   Lisinopril Rash    Caused a rash and caused her to sweat a lot.   Metformin And Related Itching and Rash     Home Medications  Prior to Admission medications   Medication Sig Start Date End Date Taking? Authorizing Provider  acetaminophen (TYLENOL) 500 MG tablet Take 1,000 mg by mouth every 6 (six) hours as needed for mild pain, fever or headache.   Yes [provider]  budesonide-formoterol (SYMBICORT) 160-4.5 MCG/ACT inhaler Inhale 2 puffs into the lungs 2 (two) times daily. 07/01/22  Yes Kerin Perna, NP  ibuprofen (ADVIL) 200 MG tablet Take 200 mg by mouth every 6 (six) hours as needed for fever, headache or mild pain.   Yes [provider]  insulin glargine (LANTUS SOLOSTAR) 100 UNIT/ML Solostar Pen Inject 25 Units into the skin daily. 07/01/22  Yes Kerin Perna, NP  GLOBAL EASE INJECT PEN NEEDLES 32G X 4 MM MISC USE TO INJECT lantus EVERY DAY AS DIRECTED 07/03/22   Kerin Perna, NP  propranolol (INDERAL) 10 MG tablet Take 1 tablet (10 mg total) by mouth 2 (two) times daily. 07/01/22   Kerin Perna, NP  rosuvastatin (CRESTOR) 20 MG tablet Take 1 tablet (20 mg total) by mouth daily. 07/06/22   Kerin Perna, NP  Semaglutide,0.25 or 0.'5MG'$ /DOS, (OZEMPIC, 0.25 OR 0.5 MG/DOSE,) 2 MG/3ML SOPN Inject 0.25 mg into the skin once a week. For 4 weeks. Then, increase to 0.5 mg into the skin once a week thereafter. 07/01/22   Charlott Rakes, MD     Critical care time: n/a     Otilio Carpen Shanicka Oldenkamp, PA-C [facility] See Amion for pager If no response to pager , please call [phone] until 7pm After 7:00 pm call Elink  [phone]?[place]

## 2022-08-25 NOTE — Progress Notes (Signed)
RT attempted to draw ABG. Pt refused ABG to be drawn. Pt said she only wants blood to be drawn from her IV. Pt is alert and oriented at  this time.

## 2022-08-26 ENCOUNTER — Observation Stay (HOSPITAL_COMMUNITY): Payer: No Typology Code available for payment source

## 2022-08-26 DIAGNOSIS — J45901 Unspecified asthma with (acute) exacerbation: Principal | ICD-10-CM

## 2022-08-26 DIAGNOSIS — Z794 Long term (current) use of insulin: Secondary | ICD-10-CM

## 2022-08-26 DIAGNOSIS — E119 Type 2 diabetes mellitus without complications: Secondary | ICD-10-CM

## 2022-08-26 DIAGNOSIS — Z888 Allergy status to other drugs, medicaments and biological substances status: Secondary | ICD-10-CM | POA: Diagnosis not present

## 2022-08-26 DIAGNOSIS — E662 Morbid (severe) obesity with alveolar hypoventilation: Secondary | ICD-10-CM | POA: Diagnosis present

## 2022-08-26 DIAGNOSIS — J9601 Acute respiratory failure with hypoxia: Secondary | ICD-10-CM

## 2022-08-26 DIAGNOSIS — K760 Fatty (change of) liver, not elsewhere classified: Secondary | ICD-10-CM

## 2022-08-26 DIAGNOSIS — Z79899 Other long term (current) drug therapy: Secondary | ICD-10-CM | POA: Diagnosis not present

## 2022-08-26 DIAGNOSIS — Z6841 Body Mass Index (BMI) 40.0 and over, adult: Secondary | ICD-10-CM | POA: Diagnosis not present

## 2022-08-26 DIAGNOSIS — R609 Edema, unspecified: Secondary | ICD-10-CM | POA: Diagnosis not present

## 2022-08-26 DIAGNOSIS — I110 Hypertensive heart disease with heart failure: Secondary | ICD-10-CM | POA: Diagnosis present

## 2022-08-26 DIAGNOSIS — Z87891 Personal history of nicotine dependence: Secondary | ICD-10-CM | POA: Diagnosis not present

## 2022-08-26 DIAGNOSIS — E785 Hyperlipidemia, unspecified: Secondary | ICD-10-CM | POA: Diagnosis present

## 2022-08-26 DIAGNOSIS — K219 Gastro-esophageal reflux disease without esophagitis: Secondary | ICD-10-CM | POA: Diagnosis present

## 2022-08-26 DIAGNOSIS — Z91030 Bee allergy status: Secondary | ICD-10-CM | POA: Diagnosis not present

## 2022-08-26 DIAGNOSIS — I509 Heart failure, unspecified: Secondary | ICD-10-CM | POA: Diagnosis present

## 2022-08-26 DIAGNOSIS — R0602 Shortness of breath: Secondary | ICD-10-CM | POA: Diagnosis present

## 2022-08-26 DIAGNOSIS — T380X5A Adverse effect of glucocorticoids and synthetic analogues, initial encounter: Secondary | ICD-10-CM | POA: Diagnosis present

## 2022-08-26 DIAGNOSIS — I5030 Unspecified diastolic (congestive) heart failure: Secondary | ICD-10-CM

## 2022-08-26 DIAGNOSIS — G4733 Obstructive sleep apnea (adult) (pediatric): Secondary | ICD-10-CM

## 2022-08-26 DIAGNOSIS — Z8249 Family history of ischemic heart disease and other diseases of the circulatory system: Secondary | ICD-10-CM | POA: Diagnosis not present

## 2022-08-26 DIAGNOSIS — D649 Anemia, unspecified: Secondary | ICD-10-CM | POA: Diagnosis present

## 2022-08-26 DIAGNOSIS — Z91013 Allergy to seafood: Secondary | ICD-10-CM | POA: Diagnosis not present

## 2022-08-26 DIAGNOSIS — Z7985 Long-term (current) use of injectable non-insulin antidiabetic drugs: Secondary | ICD-10-CM | POA: Diagnosis not present

## 2022-08-26 DIAGNOSIS — Z7951 Long term (current) use of inhaled steroids: Secondary | ICD-10-CM | POA: Diagnosis not present

## 2022-08-26 DIAGNOSIS — Z833 Family history of diabetes mellitus: Secondary | ICD-10-CM | POA: Diagnosis not present

## 2022-08-26 DIAGNOSIS — Z1152 Encounter for screening for COVID-19: Secondary | ICD-10-CM | POA: Diagnosis not present

## 2022-08-26 DIAGNOSIS — E1165 Type 2 diabetes mellitus with hyperglycemia: Secondary | ICD-10-CM | POA: Diagnosis present

## 2022-08-26 LAB — GLUCOSE, CAPILLARY
Glucose-Capillary: 292 mg/dL — ABNORMAL HIGH (ref 70–99)
Glucose-Capillary: 304 mg/dL — ABNORMAL HIGH (ref 70–99)
Glucose-Capillary: 363 mg/dL — ABNORMAL HIGH (ref 70–99)
Glucose-Capillary: 374 mg/dL — ABNORMAL HIGH (ref 70–99)

## 2022-08-26 LAB — BASIC METABOLIC PANEL
Anion gap: 11 (ref 5–15)
BUN: 13 mg/dL (ref 6–20)
CO2: 29 mmol/L (ref 22–32)
Calcium: 9.3 mg/dL (ref 8.9–10.3)
Chloride: 96 mmol/L — ABNORMAL LOW (ref 98–111)
Creatinine, Ser: 0.65 mg/dL (ref 0.44–1.00)
GFR, Estimated: >60 mL/min (ref >60)
Glucose, Bld: 239 mg/dL — ABNORMAL HIGH (ref 70–99)
Potassium: 4.6 mmol/L (ref 3.5–5.1)
Sodium: 136 mmol/L (ref 135–145)

## 2022-08-26 LAB — ECHOCARDIOGRAM COMPLETE
AR max vel: 2.67 cm2
AV Area VTI: 2.32 cm2
AV Area mean vel: 2.35 cm2
AV Mean grad: 6 mmHg
AV Peak grad: 10.4 mmHg
Ao pk vel: 1.61 m/s
Area-P 1/2: 4.74 cm2
Calc EF: 63.6 %
Height: 62 in
S' Lateral: 2.7 cm
Single Plane A2C EF: 55.6 %
Single Plane A4C EF: 66.8 %
Weight: 6000 oz

## 2022-08-26 LAB — CBC
HCT: 37.5 % (ref 36.0–46.0)
Hemoglobin: 11.1 g/dL — ABNORMAL LOW (ref 12.0–15.0)
MCH: 25.5 pg — ABNORMAL LOW (ref 26.0–34.0)
MCHC: 29.6 g/dL — ABNORMAL LOW (ref 30.0–36.0)
MCV: 86.2 fL (ref 80.0–100.0)
Platelets: 301 10*3/uL (ref 150–400)
RBC: 4.35 MIL/uL (ref 3.87–5.11)
RDW: 14.9 % (ref 11.5–15.5)
WBC: 9.4 10*3/uL (ref 4.0–10.5)
nRBC: 0 % (ref 0.0–0.2)

## 2022-08-26 LAB — HEMOGLOBIN A1C
Hgb A1c MFr Bld: 9.6 % — ABNORMAL HIGH (ref 4.8–5.6)
Mean Plasma Glucose: 229 mg/dL

## 2022-08-26 MED ORDER — INSULIN GLARGINE-YFGN 100 UNIT/ML ~~LOC~~ SOLN
35.0000 [IU] | Freq: Every day | SUBCUTANEOUS | Status: DC
  Administered 2022-08-27: 35 [IU] via SUBCUTANEOUS
  Filled 2022-08-26: qty 0.35

## 2022-08-26 MED ORDER — PREDNISONE 20 MG PO TABS
40.0000 mg | ORAL_TABLET | Freq: Every day | ORAL | Status: DC
  Administered 2022-08-27: 40 mg via ORAL
  Filled 2022-08-26: qty 2

## 2022-08-26 MED ORDER — LIVING WELL WITH DIABETES BOOK
Freq: Once | Status: AC
  Filled 2022-08-26: qty 1

## 2022-08-26 MED ORDER — ENSURE MAX PROTEIN PO LIQD
11.0000 [oz_av] | Freq: Every day | ORAL | Status: DC
  Administered 2022-08-26 – 2022-08-27 (×2): 11 [oz_av] via ORAL
  Filled 2022-08-26 (×2): qty 330

## 2022-08-26 MED ORDER — PERFLUTREN LIPID MICROSPHERE
1.0000 mL | INTRAVENOUS | Status: AC | PRN
  Administered 2022-08-26: 2 mL via INTRAVENOUS

## 2022-08-26 NOTE — Progress Notes (Signed)
Pt oxygen weaned off to RA and pt tolerating well. Pt oxygenation remains in the 90's at 94 RA. Pt ambulated around room, bathed without oxygen and ambulated around the unit about 150 ft on RA and oxygen remained at the 90's and oxygen dropped to 92% RA with ambulation and sustaining between 94% and 95% at rest. Pt back to room sitting up in chair with call light within reach and family at bedside. Will continue to monitor. Delia Heady RN

## 2022-08-26 NOTE — Progress Notes (Signed)
Initial Nutrition Assessment  DOCUMENTATION CODES:   Morbid obesity  INTERVENTION:   - Ensure Max po daily, each supplement provides 150 kcal and 30 grams of protein  - Diabetes diet education and handouts provided  NUTRITION DIAGNOSIS:   Increased nutrient needs related to acute illness as evidenced by estimated needs.  GOAL:   Patient will meet greater than or equal to 90% of their needs  MONITOR:   PO intake, Supplement acceptance, Labs, Weight trends, I & O's  REASON FOR ASSESSMENT:   Malnutrition Screening Tool, Consult Diet education  ASSESSMENT:   32 year old female who presented to the ED on 2/11 with asthma exacerbation. PMH of asthma, OSA, uncontrolled T2DM, HTN, fatty liver disease, dyslipidemia.  Spoke with pt at bedside. Pt reports good appetite at baseline. Pt shares that she has a lot going on with family and work and is under a lot of stress. Pt states that she has a Psychologist, sport and exercise who is going to help with her grocery shopping and meal prepping.  Pt requesting diet education materials. Several handouts about DM diet were provided to pt from the Academy of [employer] including "Plate Method for Diabetes" and "Carbohydrate Counting for People with Diabetes." Reviewed handouts with pt at bedside.  Reviewed pt's dietary recall. Discussed different food groups and their effects on blood sugar, emphasizing carbohydrate-containing foods. Provided list of carbohydrates and recommended serving sizes of common foods. Discussed importance of controlled and consistent carbohydrate intake throughout the day. Provided examples of ways to balance meals/snacks and encouraged intake of high-fiber, whole grain complex carbohydrates. Teach back method used.  Discussed oral nutrition supplements and recommendation for high-protein, lower carbohydrate supplement. Pt willing to try Ensure Max during admission. RD to order.  Meal Completion: 100%  Medications  reviewed and include: SSI, semglee 25 units, IV solu-medrol  Labs reviewed: hemoglobin A1C 9.6 CBG's: 227-326 x 24 hours  UOP: 1200 ml x 24 hours I/O's: -1.0 L since admit  NUTRITION - FOCUSED PHYSICAL EXAM:  Flowsheet Row Most Recent Value  Orbital Region No depletion  Upper Arm Region No depletion  Thoracic and Lumbar Region No depletion  Buccal Region No depletion  Temple Region No depletion  Clavicle Bone Region No depletion  Clavicle and Acromion Bone Region No depletion  Scapular Bone Region No depletion  Dorsal Hand No depletion  Patellar Region No depletion  Anterior Thigh Region No depletion  Posterior Calf Region No depletion  Edema (RD Assessment) Mild  Hair Reviewed  Eyes Reviewed  Mouth Reviewed  Skin Reviewed  Nails Reviewed       Diet Order:   Diet Order             Diet Carb Modified Fluid consistency: Thin; Room service appropriate? Yes  Diet effective now                   EDUCATION NEEDS:   Education needs have been addressed  Skin:  Skin Assessment: Reviewed RN Assessment  Last BM:  08/26/22 large type 2  Height:   Ht Readings from Last 1 Encounters:  08/24/22 '5\' 2"'$  (1.575 m)    Weight:   Wt Readings from Last 1 Encounters:  08/24/22 (!) 170.1 kg    Ideal Body Weight:  50 kg  BMI:  Body mass index is 68.59 kg/m.  Estimated Nutritional Needs:   Kcal:  2000-2200  Protein:  100-120 grams  Fluid:  >1.8 L    Gustavus Bryant, MS, RD, LDN Inpatient Clinical  Dietitian Please see AMiON for contact information.

## 2022-08-26 NOTE — Progress Notes (Cosign Needed Addendum)
Noted referral regarding home Trilogy- reviewed notes from Dr. Halford Chessman office dating 2022- call made to [employer] to clarify DME in the home- Per Rica Koyanagi with [employer]- pt does not have Bipap- Pt does currently have a home Trilogy NIV with them dating from 12/2020- pt also has home 02 w/ [employer].

## 2022-08-26 NOTE — Progress Notes (Signed)
  Echocardiogram 2D Echocardiogram has been performed.  Yvette Gibbs 08/26/2022, 8:27 AM

## 2022-08-26 NOTE — Progress Notes (Signed)
Lower extremity venous bilateral study completed.   Please see CV Proc for preliminary results.   Jamirra Curnow, RDMS, RVT  

## 2022-08-26 NOTE — Inpatient Diabetes Management (Addendum)
Inpatient Diabetes Program Recommendations  AACE/ADA: New Consensus Statement on Inpatient Glycemic Control (2015)  Target Ranges:  Prepandial:   less than 140 mg/dL      Peak postprandial:   less than 180 mg/dL (1-2 hours)      Critically ill patients:  140 - 180 mg/dL   Lab Results  Component Value Date   GLUCAP 292 (H) 08/26/2022   HGBA1C 9.6 (H) 08/25/2022    Review of Glycemic Control  Latest Reference Range & Units 08/25/22 20:51 08/26/22 07:50  Glucose-Capillary 70 - 99 mg/dL 326 (H) 292 (H)  (H): Data is abnormally high Diabetes history: DM 2 Outpatient Diabetes medications: Ozempic 0.25 mg weekly, Lantus 25 units Daily Current orders for Inpatient glycemic control:  Semglee 25 units Daily Novolog 0-20 units tid + hs   Solumedrol 120 mg Daily   Inpatient Diabetes Program Recommendations:     While on steroids consider: - Novolog 8 units tid meal coverage if eating >50% of meals. -Increasing Semglee to 35 units QD  Spoke with patient regarding outpatient diabetes management. Verified home medications, Ozempic was recently started and patient feels that the dose needs increasing. Also, at that time patient was told to take Lantus PRN. She reports that she had been continuing to take daily.  Reviewed patient's current A1c of 9.6%. Explained what a A1c is and what it measures. Also reviewed goal A1c with patient, importance of good glucose control @ home, and blood sugar goals. Reviewed patho of DM, need for insulin, role of pancreas, impact of steroids, survival skills, interventions, vascular changes, and commorbidities.  Patient will need a meter at discharge. Has not been checking blood sugars but states, "I know I need to start." Reviewed recommended frequency and attached information to discharge summary. Also, outpatient education referral placed. Stressed importance of follow up with PCP and attached endocrinology list to discharge summary.  Denies drinking sugary  beverages. Reviewed other foods that are higher in CHO and encouraged addition of protein with snacks to help curb hunger. Patient in agreement.     Thanks, Bronson Curb, MSN, RNC-OB Diabetes Coordinator [phone] (8a-5p)

## 2022-08-26 NOTE — Progress Notes (Signed)
Yvette Gibbs, MRN:  [id number], DOB:  25-Aug-1990, LOS: 1 ADMISSION DATE:  08/24/2022, CONSULTATION DATE:  08/26/22 REFERRING MD:  Algis Liming, CHIEF COMPLAINT:  shortness of breath   History of Present Illness:  Yvette Gibbs is a 32 y.o. F with PMH significant for Asthma, OSA, OHS, type 2 DM, HTN and depression who has been followed by Dr. Halford Chessman at [facility] and had a home sleep study and PFT's in 01/2021 which showed moderate OSA and mild restrictive defect without BD response.  She then had a PAP titration study and was recommended to start Bipap 29/24, however pt was never able to obtain this and has been just using a rescue inhaler 4 times per day.  She states she has never obtained Trilogy, CPAP or Bipap at home  She presented to the ED on 3/11 after the onset of dizziness, wheezing and dyspnea on 3/10.  She has not had recent URI symptoms and does not use tobacco.  Her CXR was consistent with mild pulmonary edema, she had no leukocytosis or lactic acidosis pH 7.2 on VBG, pCO2 78, BNP 13.    She was treated with steroids, duobnebs and Lasix and PCCM consulted in the his setting.  She thinks her RLE may be slightly swollen compared to the L  Pertinent  Medical History   has a past medical history of Acute exacerbation of CHF (congestive heart failure) ([place]) (05/04/2021), Asthma, Depression, Diabetes mellitus type 2, uncontrolled, Extreme obesity, Family history of adverse reaction to anesthesia, Hypertension, Mental disorder, Obesity, and OSA (obstructive sleep apnea).   Significant Hospital Events: Including procedures, antibiotic start and stop dates in addition to other pertinent events   3/11 admitted to [facility] with shortness of breath 3/12 PCCM consult  Interim History / Subjective:  No acute distress at rest  Objective   Blood pressure (!) 144/88, pulse 95, temperature 98 F (36.7 C), temperature source Oral, resp. rate (!) 25, height '5\' 2"'$  (1.575 m), weight (!) 170.1  kg, SpO2 93 %.    FiO2 (%):  [28 %] 28 %   Intake/Output Summary (Last 24 hours) at 08/26/2022 1012 Last data filed at 08/26/2022 0800 Gross per 24 hour  Intake 480 ml  Output 300 ml  Net 180 ml   Filed Weights   08/24/22 2244  Weight: (!) 170.1 kg    General: Morbidly obese female no acute distress HEENT: MM pink/moist no JVD Neuro: Grossly intact without focal defect CV: Heart sounds are regular PULM: Diminished throughout GI: soft, bsx4 active obese GU: Voids Extremities: warm/dry, 2+ edema  Skin: no rashes or lesions    Resolved Hospital Problem list     Assessment & Plan:     Acute Hypoxic Respiratory Failure likely secondary to mild asthma exacerbation and volume overload Baseline morbid obesity, OSA and likely OHS States was never able to obtain CPAP, Bipap or Trilogy as the office did not follow up with her Low suspicion PE without tachycardia, elevated troponin or BNP Monitor lower extremity Doppler studies Arrange for BiPAP as outpatient Follow-up with Dr. Halford Chessman as an outpatient Weight loss    Best Practice (right click and "Reselect all SmartList Selections" daily)   Per primary  Labs   CBC: Recent Labs  Lab 08/24/22 2345 08/25/22 0101 08/25/22 0805 08/26/22 0250  WBC 7.2  --  9.4 9.4  NEUTROABS 5.3  --   --   --   HGB 11.4* 13.3 11.5* 11.1*  HCT 38.6 39.0 39.3 37.5  MCV 87.1  --  87.1 86.2  PLT 274  --  284 [id number]    Basic Metabolic Panel: Recent Labs  Lab 08/24/22 2345 08/25/22 0101 08/25/22 0805 08/26/22 0519  NA 136 136  --  136  K 4.4 4.5  --  4.6  CL 97*  --   --  96*  CO2 30  --   --  29  GLUCOSE 208*  --   --  239*  BUN 8  --   --  13  CREATININE 0.57  --  0.70 0.65  CALCIUM 8.8*  --   --  9.3   GFR: Estimated Creatinine Clearance: 160.7 mL/min (by C-G formula based on SCr of 0.65 mg/dL). Recent Labs  Lab 08/24/22 2345 08/25/22 0805 08/26/22 0250  WBC 7.2 9.4 9.4  LATICACIDVEN 1.0  --   --     Liver Function  Tests: Recent Labs  Lab 08/24/22 2345  AST 26  ALT 26  ALKPHOS 73  BILITOT 0.3  PROT 7.6  ALBUMIN 3.0*   No results for input(s): "LIPASE", "AMYLASE" in the last 168 hours. No results for input(s): "AMMONIA" in the last 168 hours.  ABG    Component Value Date/Time   PHART 7.277 (L) 12/17/2020 0612   PCO2ART 76.0 (HH) 12/17/2020 0612   PO2ART 91 12/17/2020 0612   HCO3 36.1 (H) 08/25/2022 0805   TCO2 37 (H) 08/25/2022 0101   O2SAT 81.9 08/25/2022 0805     Coagulation Profile: No results for input(s): "INR", "PROTIME" in the last 168 hours.  Cardiac Enzymes: No results for input(s): "CKTOTAL", "CKMB", "CKMBINDEX", "TROPONINI" in the last 168 hours.  HbA1C: HbA1c, POC (controlled diabetic range)  Date/Time Value Ref Range Status  07/01/2022 02:03 PM 7.7 (A) 0.0 - 7.0 % Final   Hgb A1c MFr Bld  Date/Time Value Ref Range Status  08/25/2022 08:05 AM 9.6 (H) 4.8 - 5.6 % Final    Comment:    (NOTE)         Prediabetes: 5.7 - 6.4         Diabetes: >6.4         Glycemic control for adults with diabetes: <7.0   05/05/2021 04:57 AM 8.8 (H) 4.8 - 5.6 % Final    Comment:    (NOTE) Pre diabetes:          5.7%-6.4%  Diabetes:              >6.4%  Glycemic control for   <7.0% adults with diabetes     CBG: Recent Labs  Lab 08/25/22 1113 08/25/22 1501 08/25/22 1809 08/25/22 2051 08/26/22 0750  GLUCAP 277* 320* 296* 326* 292*     Critical care time: n/a    Richardson Landry Litsy Epting ACNP Acute Care Nurse Practitioner [place] Please consult Amion 08/26/2022, 10:12 AM

## 2022-08-26 NOTE — Progress Notes (Signed)
PROGRESS NOTE    Yvette Gibbs  [id number] DOB: Apr 19, 1991 DOA: 08/24/2022 PCP: Kerin Perna, NP   Brief Narrative: Yvette Gibbs is a 32 y.o. female with a history of asthma, morbid obesity, diabetes mellitus type 2, NAFLD, hyperlipidemia, hypertension, OSA, OHS. Patient presented secondary to chest tightness and shortness of breath, consistent with an asthma exacerbation. No CTA chest obtained this admission. Patient managed with supplemental oxygen/BiPAP, steroids and albuterol with improvement of symptoms, not yet at baseline.   Assessment and Plan:  Acute respiratory failure with hypoxia Present on admission requiring BiPAP for respiratory distress. Secondary to acute asthma. Pulmonology consulted. Oxygen weaned to room air at rest. -Ambulatory pulse oximetry  Asthma exacerbation Present on admission. Patient with significant respiratory distress and hypoxia requiring BiPAP. Steroids and breathing treatments initiated. Symptoms are improved but patient is not at baseline currently. -Transition from solu-medrol to prednisone tomorrow -Continue Dulera, Pulmicort, Albuterol  Pulmonary edema Noted on imaging. Unclear acuity. Unclear if this finding is contributing to presentation. Patient given Lasix IV x1. Transthoracic Echocardiogram confirms a normal-high LVEF with normal LV diastolic parameters and moderate LV hypertrophy.   OSA Concern for likely OHS. Patient follows with pulmonology as an outpatient with plans for BiPAP/Trilogy. -BiPAP while inpatient  Diabetes mellitus type 2 Uncontrolled with hyperglycemia. Most recent hemoglobin A1C of 9.6%. Worse control likely secondary to IV steroids. Patient is managed on insulin glargine 25 units daily and semaglutide as an outpatient. -Increase to Semglee 35 units daily -Continue SSI  Primary hypertension -Continue propranolol  Hyperlipidemia -Continue Crestor  Anemia Mild. Intermittent. Menstruating female.  No report of hemorrhage noted.  NAFLD Likely secondary to severe morbid obesity. LFTs within normal limits. Patient will need to continue work on weight loss efforts.   Morbid obesity Estimated body mass index is 68.59 kg/m as calculated from the following:   Height as of this encounter: '5\' 2"'$  (1.575 m).   Weight as of this encounter: 170.1 kg.  DVT prophylaxis: Lovenox Code Status:   Code Status: Full Code Family Communication: None at bedside Disposition Plan: Discharge home likely in 24 hours pending continued improvement of functional capacity, ambulatory pulse ox, continued pulmonology recommendations   Consultants:  Pulmonology  Procedures:  3/13: Transthoracic Echocardiogram   Antimicrobials: None    Subjective: Patient reports continued dyspnea with movement as simple as moving within the bed. Patient states she continues to have wheezing but just recently received a breathing treatment (per [facility], this appears to have been Dulera/Pulmicort.  Objective: BP (!) 144/88 (BP Location: Right Wrist)   Pulse 95   Temp 98 F (36.7 C) (Oral)   Resp (!) 25   Ht '5\' 2"'$  (1.575 m)   Wt (!) 170.1 kg   SpO2 93%   BMI 68.59 kg/m   Examination:  General exam: Appears calm and comfortable. Respiratory system: Mild wheezing. Respiratory effort normal. Cardiovascular system: S1 & S2 heard. Mild tachycardia with normal rhythm. No murmurs, rubs, gallops or clicks. Gastrointestinal system: Abdomen is nondistended, soft and nontender. Normal bowel sounds heard. Central nervous system: Alert and oriented. No focal neurological deficits. Musculoskeletal: No edema. No calf tenderness Skin: No cyanosis. No rashes Psychiatry: Judgement and insight appear normal. Mood & affect appropriate.    Data Reviewed: I have personally reviewed following labs and imaging studies  CBC Lab Results  Component Value Date   WBC 9.4 08/26/2022   RBC 4.35 08/26/2022   HGB 11.1 (L) 08/26/2022   HCT  37.5 08/26/2022   MCV  86.2 08/26/2022   MCH 25.5 (L) 08/26/2022   PLT 301 08/26/2022   MCHC 29.6 (L) 08/26/2022   RDW 14.9 08/26/2022   LYMPHSABS 1.3 08/24/2022   MONOABS 0.4 08/24/2022   EOSABS 0.1 08/24/2022   BASOSABS 0.0 0000000     Last metabolic panel Lab Results  Component Value Date   NA 136 08/26/2022   K 4.6 08/26/2022   CL 96 (L) 08/26/2022   CO2 29 08/26/2022   BUN 13 08/26/2022   CREATININE 0.65 08/26/2022   GLUCOSE 239 (H) 08/26/2022   GFRNONAA >60 08/26/2022   GFRAA >60 02/25/2020   CALCIUM 9.3 08/26/2022   PHOS 4.1 12/21/2020   PROT 7.6 08/24/2022   ALBUMIN 3.0 (L) 08/24/2022   BILITOT 0.3 08/24/2022   ALKPHOS 73 08/24/2022   AST 26 08/24/2022   ALT 26 08/24/2022   ANIONGAP 11 08/26/2022    GFR: Estimated Creatinine Clearance: 160.7 mL/min (by C-G formula based on SCr of 0.65 mg/dL).  Recent Results (from the past 240 hour(s))  Resp panel by RT-PCR (RSV, Flu A&B, Covid) Anterior Nasal Swab     Status: None   Collection Time: 08/25/22  2:54 AM   Specimen: Anterior Nasal Swab  Result Value Ref Range Status   SARS Coronavirus 2 by RT PCR NEGATIVE NEGATIVE Final   Influenza A by PCR NEGATIVE NEGATIVE Final   Influenza B by PCR NEGATIVE NEGATIVE Final    Comment: (NOTE) The Xpert Xpress SARS-CoV-2/FLU/RSV plus assay is intended as an aid in the diagnosis of influenza from Nasopharyngeal swab specimens and should not be used as a sole basis for treatment. Nasal washings and aspirates are unacceptable for Xpert Xpress SARS-CoV-2/FLU/RSV testing.  Fact Sheet for Patients: EntrepreneurPulse.com.au  Fact Sheet for Healthcare Providers: IncredibleEmployment.be  This test is not yet approved or cleared by the [place] FDA and has been authorized for detection and/or diagnosis of SARS-CoV-2 by FDA under an Emergency Use Authorization (EUA). This EUA will remain in effect (meaning this test can be used) for  the duration of the COVID-19 declaration under Section 564(b)(1) of the Act, 21 U.S.C. section 360bbb-3(b)(1), unless the authorization is terminated or revoked.     Resp Syncytial Virus by PCR NEGATIVE NEGATIVE Final    Comment: (NOTE) Fact Sheet for Patients: EntrepreneurPulse.com.au  Fact Sheet for Healthcare Providers: IncredibleEmployment.be  This test is not yet approved or cleared by the [place] FDA and has been authorized for detection and/or diagnosis of SARS-CoV-2 by FDA under an Emergency Use Authorization (EUA). This EUA will remain in effect (meaning this test can be used) for the duration of the COVID-19 declaration under Section 564(b)(1) of the Act, 21 U.S.C. section 360bbb-3(b)(1), unless the authorization is terminated or revoked.  Performed at [facility], [place] [address] [place], [place] [address]   Respiratory (~20 pathogens) panel by PCR     Status: None   Collection Time: 08/25/22  1:07 PM   Specimen: Nasopharyngeal Swab; Respiratory  Result Value Ref Range Status   Adenovirus NOT DETECTED NOT DETECTED Final   Coronavirus 229E NOT DETECTED NOT DETECTED Final    Comment: (NOTE) The Coronavirus on the Respiratory Panel, DOES NOT test for the novel  Coronavirus (2019 nCoV)    Coronavirus HKU1 NOT DETECTED NOT DETECTED Final   Coronavirus NL63 NOT DETECTED NOT DETECTED Final   Coronavirus OC43 NOT DETECTED NOT DETECTED Final   Metapneumovirus NOT DETECTED NOT DETECTED Final   Rhinovirus / Enterovirus NOT DETECTED NOT DETECTED  Final   Influenza A NOT DETECTED NOT DETECTED Final   Influenza B NOT DETECTED NOT DETECTED Final   Parainfluenza Virus 1 NOT DETECTED NOT DETECTED Final   Parainfluenza Virus 2 NOT DETECTED NOT DETECTED Final   Parainfluenza Virus 3 NOT DETECTED NOT DETECTED Final   Parainfluenza Virus 4 NOT DETECTED NOT DETECTED Final   Respiratory Syncytial Virus NOT DETECTED NOT DETECTED Final    Bordetella pertussis NOT DETECTED NOT DETECTED Final   Bordetella Parapertussis NOT DETECTED NOT DETECTED Final   Chlamydophila pneumoniae NOT DETECTED NOT DETECTED Final   Mycoplasma pneumoniae NOT DETECTED NOT DETECTED Final    Comment: Performed at [facility], [place] [address] [place], [place] [address]      Radiology Studies: ECHOCARDIOGRAM COMPLETE  Result Date: 08/26/2022    ECHOCARDIOGRAM REPORT   Patient Name:   Latanya Barberi Date of Exam: 08/26/2022 Medical Rec #:  [id number]        Height:       62.0 in Accession #:    [id number]       Weight:       375.0 lb Date of Birth:  [birth year]        BSA:          2.497 m Patient Age:    31 years         BP:           140/77 mmHg Patient Gender: F                HR:           96 bpm. Exam Location:  Inpatient Procedure: 2D Echo and Intracardiac Opacification Agent Indications:    CHF  History:        Patient has prior history of Echocardiogram examinations, most                 recent 05/05/2021. Risk Factors:Hypertension.  Sonographer:    Harvie Junior Referring Phys: Samella Parr  Sonographer Comments: Technically difficult study due to poor echo windows, no subcostal window and patient is obese. Image acquisition challenging due to patient body habitus. IMPRESSIONS  1. Left ventricular ejection fraction, by estimation, is 60 to 65%. The left ventricle has normal function. The left ventricle has no regional wall motion abnormalities. There is moderate left ventricular hypertrophy. Left ventricular diastolic parameters were normal.  2. Respiratory related ventricular septal shift may be secondary to pulmonary process vs constrictive physiology.  3. Right ventricular systolic function is normal. The right ventricular size is normal. Tricuspid regurgitation signal is inadequate for assessing PA pressure.  4. The mitral valve is grossly normal. Trivial mitral valve regurgitation. No evidence of mitral stenosis.  5. The aortic valve is grossly  normal. Unable to determine aortic valve morphology due to image quality. Aortic valve regurgitation is not visualized. No aortic stenosis is present. FINDINGS  Left Ventricle: Left ventricular ejection fraction, by estimation, is 60 to 65%. The left ventricle has normal function. The left ventricle has no regional wall motion abnormalities. Definity contrast agent was given IV to delineate the left ventricular  endocardial borders. The left ventricular internal cavity size was normal in size. There is moderate left ventricular hypertrophy. Left ventricular diastolic parameters were normal. Right Ventricle: The right ventricular size is normal. Right vetricular wall thickness was not well visualized. Right ventricular systolic function is normal. Tricuspid regurgitation signal is inadequate for assessing PA pressure. Left Atrium: Left atrial size was normal in size.  Right Atrium: Right atrial size was not well visualized. Pericardium: Trivial pericardial effusion is present. Mitral Valve: The mitral valve is grossly normal. Trivial mitral valve regurgitation. No evidence of mitral valve stenosis. Tricuspid Valve: The tricuspid valve is not well visualized. Tricuspid valve regurgitation is trivial. Aortic Valve: The aortic valve is grossly normal. Aortic valve regurgitation is not visualized. No aortic stenosis is present. Aortic valve mean gradient measures 6.0 mmHg. Aortic valve peak gradient measures 10.4 mmHg. Aortic valve area, by VTI measures  2.32 cm. Pulmonic Valve: The pulmonic valve was not well visualized. Pulmonic valve regurgitation is trivial. Aorta: The aortic root is normal in size and structure. Venous: The inferior vena cava was not well visualized. IAS/Shunts: The interatrial septum was not well visualized.  LEFT VENTRICLE PLAX 2D LVIDd:         3.80 cm   Diastology LVIDs:         2.70 cm   LV e' medial:    10.00 cm/s LV PW:         1.30 cm   LV E/e' medial:  12.7 LV IVS:        1.30 cm   LV e'  lateral:   16.80 cm/s LVOT diam:     2.00 cm   LV E/e' lateral: 7.6 LV SV:         70 LV SV Index:   28 LVOT Area:     3.14 cm  RIGHT VENTRICLE RV S prime:     12.20 cm/s TAPSE (M-mode): 1.9 cm LEFT ATRIUM         Index LA diam:    4.00 cm 1.60 cm/m  AORTIC VALVE                     PULMONIC VALVE AV Area (Vmax):    2.67 cm      PV Vmax:       1.23 m/s AV Area (Vmean):   2.35 cm      PV Peak grad:  6.1 mmHg AV Area (VTI):     2.32 cm AV Vmax:           161.00 cm/s AV Vmean:          116.000 cm/s AV VTI:            0.300 m AV Peak Grad:      10.4 mmHg AV Mean Grad:      6.0 mmHg LVOT Vmax:         137.00 cm/s LVOT Vmean:        86.800 cm/s LVOT VTI:          0.222 m LVOT/AV VTI ratio: 0.74  AORTA Ao Root diam: 3.20 cm MITRAL VALVE MV Area (PHT): 4.74 cm     SHUNTS MV Decel Time: 160 msec     Systemic VTI:  0.22 m MV E velocity: 127.00 cm/s  Systemic Diam: 2.00 cm MV A velocity: 79.60 cm/s MV E/A ratio:  1.60 Cherlynn Kaiser MD Electronically signed by Cherlynn Kaiser MD Signature Date/Time: 08/26/2022/9:48:00 AM    Final    CT HEAD WO CONTRAST (5MM)  Result Date: 08/25/2022 CLINICAL DATA:  Mental status change, unknown cause EXAM: CT HEAD WITHOUT CONTRAST TECHNIQUE: Contiguous axial images were obtained from the base of the skull through the vertex without intravenous contrast. RADIATION DOSE REDUCTION: This exam was performed according to the departmental dose-optimization program which includes automated exposure control, adjustment of the mA and/or kV according  to patient size and/or use of iterative reconstruction technique. COMPARISON:  None Available. FINDINGS: Brain: No evidence of large-territorial acute infarction. No parenchymal hemorrhage. No mass lesion. No extra-axial collection. No mass effect or midline shift. No hydrocephalus. Basilar cisterns are patent. Vascular: No hyperdense vessel. Skull: No acute fracture or focal lesion. Sinuses/Orbits: Paranasal sinuses and mastoid air cells are  clear. The orbits are unremarkable. Other: None. IMPRESSION: No acute intracranial abnormality. Electronically Signed   By: Iven Finn M.D.   On: 08/25/2022 02:07   DG Chest Port 1 View  Result Date: 08/24/2022 CLINICAL DATA:  Short of breath EXAM: PORTABLE CHEST 1 VIEW COMPARISON:  07/23/2022 FINDINGS: Single frontal view of the chest demonstrates stable enlargement of the cardiac silhouette. Lung volumes are diminished, with increased central vascular congestion since prior exam. Patchy bilateral perihilar airspace disease. No effusion or pneumothorax. IMPRESSION: 1. Increased vascular congestion, with bilateral perihilar airspace disease most consistent with edema. 2. Stable enlarged cardiac silhouette. Electronically Signed   By: Randa Ngo M.D.   On: 08/24/2022 23:33      LOS: 1 day    Cordelia Poche, MD [facility] 08/26/2022, 10:05 AM   If 7PM-7AM, please contact night-coverage [email or web address]

## 2022-08-26 NOTE — Discharge Instructions (Signed)
Local Endocrinologists [facility] [phone]) Dr. Philemon Kingdom Dr. Jan[facility] [phone]) Dr. Delrae Rend [facility] [phone]) Dr. Jacelyn Pi Dr. Anda Kraft [facility] [phone]) Dr. Daneil Dolin [facility] [phone]) [place] office]  [phone]) [facility]ari Prows office] Dr. Lavone Orn Dr. Mee Hives [facility]) [phone]) Autumn Barbaraann Barthel Ronnald Ramp), PA Dr. Amalia Greenhouse Dr. Marsh Dolly. [facility] [phone]) Dr. Glade Lloyd [facility] [phone]) Dr. Orville Govern Dr. Lelon Huh Dr. Lavone Nian, FNP Dr. Carolynn Serve. [place] in [place] [phone])   Checking blood glucose:  With meter check blood sugar 3-4 times per day.  Once prior to eating/drinking in the morning Once prior to lunch Once 1-2 hours after eating dinner  IF blood sugars exceed 180 mg/dL consistently reach out to primary doctor for appointment IF more than one low blood sugar a week (<70 mg/dL) reach out to primary doctor for appointment.  Take medications consistently. Make appointment for follow up regarding Ozempic dose within the next two weeks. Keep in mind steroids can increase glucose and this may want discussion with primary doctor for insulin adjustments.

## 2022-08-26 NOTE — Hospital Course (Addendum)
Yvette Gibbs is a 32 y.o. female with a history of asthma, morbid obesity, diabetes mellitus type 2, NAFLD, hyperlipidemia, hypertension, OSA, OHS. Patient presented secondary to chest tightness and shortness of breath, consistent with an asthma exacerbation. No CTA chest obtained this admission. Patient managed with supplemental oxygen/BiPAP, steroids and albuterol with improvement of symptoms, not yet at baseline.

## 2022-08-27 ENCOUNTER — Telehealth: Payer: Self-pay | Admitting: Emergency Medicine

## 2022-08-27 DIAGNOSIS — I10 Essential (primary) hypertension: Secondary | ICD-10-CM

## 2022-08-27 DIAGNOSIS — K219 Gastro-esophageal reflux disease without esophagitis: Secondary | ICD-10-CM

## 2022-08-27 LAB — GLUCOSE, CAPILLARY
Glucose-Capillary: 223 mg/dL — ABNORMAL HIGH (ref 70–99)
Glucose-Capillary: 310 mg/dL — ABNORMAL HIGH (ref 70–99)
Glucose-Capillary: 418 mg/dL — ABNORMAL HIGH (ref 70–99)
Glucose-Capillary: 426 mg/dL — ABNORMAL HIGH (ref 70–99)

## 2022-08-27 MED ORDER — ALBUTEROL SULFATE HFA 108 (90 BASE) MCG/ACT IN AERS: 2.0000 | INHALATION_SPRAY | Freq: Four times a day (QID) | RESPIRATORY_TRACT | 2 refills | Status: AC | PRN

## 2022-08-27 MED ORDER — LOSARTAN POTASSIUM 25 MG PO TABS: 25.0000 mg | ORAL_TABLET | Freq: Every day | ORAL | 2 refills | Status: DC

## 2022-08-27 MED ORDER — ENSURE MAX PROTEIN PO LIQD: 11.0000 [oz_av] | Freq: Every day | ORAL | 0 refills | Status: AC

## 2022-08-27 MED ORDER — LOSARTAN POTASSIUM 50 MG PO TABS
25.0000 mg | ORAL_TABLET | Freq: Every day | ORAL | Status: DC
  Administered 2022-08-27: 25 mg via ORAL
  Filled 2022-08-27: qty 1

## 2022-08-27 MED ORDER — ALBUTEROL SULFATE (2.5 MG/3ML) 0.083% IN NEBU: 2.5000 mg | INHALATION_SOLUTION | RESPIRATORY_TRACT | 2 refills | Status: DC | PRN

## 2022-08-27 MED ORDER — PREDNISONE 20 MG PO TABS: 40.0000 mg | ORAL_TABLET | Freq: Every day | ORAL | 0 refills | Status: DC

## 2022-08-27 NOTE — Telephone Encounter (Signed)
Please make the patient an office visit with Dr. Halford Chessman in [place] for continued management of OSA/OHS.  Thank you

## 2022-08-27 NOTE — Progress Notes (Signed)
Pt discharge home, pt ambulated off unit independently with RN and NT beside her to assist her with her belongings. Pt discharged home with her ride to transport her home. Delia Heady RN

## 2022-08-27 NOTE — Progress Notes (Signed)
Pt discharge education and instructions completed with pt. Pt voices understanding and denies any questions. Pt educated on the importance of following up with referred pulmonology. Pt also educated on the importance of controlling her blood sugars as her CBG was 418 prior to discharge, MD was notified and RN was instructed to give pt 20 units of novolog. Pt educated on the benefits of taking her home ordered Ozempic. Pt encouraged to take her ordered prescribed medications including the ordered ozempic per prescription and to follow up with her PCP. Pt IV and telemetry removed. Pt to pick up prescriptions from preferred pharmacy on file. Pt to be transported off unit via wheelchair with belongings to the side. Pt waiting on her ride to come pick her up. Will continue to monitor pt till pick up. P. Angelica Pou RN

## 2022-08-27 NOTE — Discharge Summary (Signed)
Physician Discharge Summary   Patient: Yvette Gibbs MRN: [id number] DOB: January 07, 1991  Admit date:     08/24/2022  Discharge date: 08/27/22  Discharge Physician: Cordelia Poche, MD   PCP: Kerin Perna, NP   Recommendations at discharge:  PCP follow-up Pulmonology follow-up  Discharge Diagnoses: Principal Problem:   Acute asthma exacerbation Active Problems:   Morbid obesity (HCC)   OSA (obstructive sleep apnea)   Asthma   HTN (hypertension)   Diabetes mellitus type 2, insulin dependent (HCC)   Gastroesophageal reflux disease without esophagitis   Asthma exacerbation   Obesity hypoventilation syndrome (HCC)   Acute respiratory failure with hypoxia (HCC)   NAFLD (nonalcoholic fatty liver disease)   Shortness of breath  Resolved Problems:   * No resolved hospital problems. *  Hospital Course: Yvette Gibbs is a 32 y.o. female with a history of asthma, morbid obesity, diabetes mellitus type 2, NAFLD, hyperlipidemia, hypertension, OSA, OHS. Patient presented secondary to chest tightness and shortness of breath, consistent with an asthma exacerbation. No CTA chest obtained this admission. Patient managed with supplemental oxygen/BiPAP, steroids and albuterol with improvement of symptoms and return to baseline. Patient not requiring oxygen with ambulation.   Assessment and Plan:  Acute respiratory failure with hypoxia Present on admission requiring BiPAP for respiratory distress. Secondary to acute asthma. Pulmonology consulted. Oxygen weaned to room air at rest.   Asthma exacerbation Present on admission. Patient with significant respiratory distress and hypoxia requiring BiPAP. Steroids and breathing treatments initiated. Symptoms are improved. Discharge on Symbicort. Albuterol prescription provided on discharge. Patient to follow-up with pulmonology as an outpatient. Prednisone discontinued secondary to uncontrolled blood sugar and resolved wheezing/bronchospasm.    Pulmonary edema Noted on imaging. Unclear acuity. Unclear if this finding is contributing to presentation. Patient given Lasix IV x1. Transthoracic Echocardiogram confirms a normal-high LVEF with normal LV diastolic parameters and moderate LV hypertrophy.    OSA/OHS Patient follows with pulmonology as an outpatient with plans for BiPAP/Trilogy. Unable to obtain BiPAP prior to discharge. Patient to follow-up with outpatient pulmonology.   Diabetes mellitus type 2 Uncontrolled with hyperglycemia. Most recent hemoglobin A1C of 9.6%. Worse control likely secondary to IV steroids. Patient is managed on insulin glargine 25 units daily and semaglutide as an outpatient. Worse control secondary to steroids. Continue home regimen and will discontinue prednisone on discharge.   Primary hypertension Continue propranolol   Hyperlipidemia Continue Crestor   Anemia Mild. Intermittent. Menstruating female. No report of hemorrhage noted.   NAFLD Likely secondary to severe morbid obesity. LFTs within normal limits. Patient will need to continue work on weight loss efforts.   Morbid obesity Estimated body mass index is 68.59 kg/m as calculated from the following:   Height as of this encounter: '5\' 2"'$  (1.575 m).   Weight as of this encounter: 170.1 kg.   Consultants: Pulmonology Procedures performed: None  Disposition: Home Diet recommendation: Carb modified  DISCHARGE MEDICATION: Allergies as of 08/27/2022       Reactions   Bee Pollen Other (See Comments)   Sneezing and eye burning   Fish Allergy Itching, Rash   Lisinopril Rash   Caused a rash and caused her to sweat a lot.   Metformin And Related Itching, Rash        Medication List     TAKE these medications    acetaminophen 500 MG tablet Commonly known as: TYLENOL Take 1,000 mg by mouth every 6 (six) hours as needed for mild pain, fever or headache.   albuterol (  2.5 MG/3ML) 0.083% nebulizer solution Commonly known as:  PROVENTIL Take 3 mLs (2.5 mg total) by nebulization every 4 (four) hours as needed for wheezing or shortness of breath (Do not use in addition to albuterol inhaler).   albuterol 108 (90 Base) MCG/ACT inhaler Commonly known as: VENTOLIN HFA Inhale 2 puffs into the lungs every 6 (six) hours as needed for wheezing or shortness of breath (Do not use in addition to albuterol nebulizer).   budesonide-formoterol 160-4.5 MCG/ACT inhaler Commonly known as: Symbicort Inhale 2 puffs into the lungs 2 (two) times daily.   Ensure Max Protein Liqd Take 330 mLs (11 oz total) by mouth daily. Start taking on: August 28, 2022   Global Ease Inject Pen Needles 32G X 4 MM Misc Generic drug: Insulin Pen Needle USE TO INJECT lantus EVERY DAY AS DIRECTED   ibuprofen 200 MG tablet Commonly known as: ADVIL Take 200 mg by mouth every 6 (six) hours as needed for fever, headache or mild pain.   Lantus SoloStar 100 UNIT/ML Solostar Pen Generic drug: insulin glargine Inject 25 Units into the skin daily.   losartan 25 MG tablet Commonly known as: COZAAR Take 1 tablet (25 mg total) by mouth daily. Start taking on: August 28, 2022   Ozempic (0.25 or 0.5 MG/DOSE) 2 MG/3ML Sopn Generic drug: Semaglutide(0.25 or 0.'5MG'$ /DOS) Inject 0.25 mg into the skin once a week. For 4 weeks. Then, increase to 0.5 mg into the skin once a week thereafter.   propranolol 10 MG tablet Commonly known as: INDERAL Take 1 tablet (10 mg total) by mouth 2 (two) times daily.   rosuvastatin 20 MG tablet Commonly known as: CRESTOR Take 1 tablet (20 mg total) by mouth daily.               Durable Medical Equipment  (From admission, onward)           Start     Ordered   08/27/22 1603  For home use only DME Nebulizer machine  Once       Question Answer Comment  Patient needs a nebulizer to treat with the following condition Asthma exacerbation   Length of Need Lifetime      08/27/22 1603            Follow-up  Information     Kerin Perna, NP. Schedule an appointment as soon as possible for a visit in 1 week(s).   Specialty: Internal Medicine Why: For hospital follow-up Contact information: [place] [address] [phone]         Chesley Mires, MD. Schedule an appointment as soon as possible for a visit.   Specialties: Pulmonary Disease, Sleep Medicine Why: For hospital follow-up Contact information: [address] [place] [address] [phone]                Discharge Exam: BP (!) 146/90 (BP Location: Right Wrist)   Pulse 88   Temp 97.9 F (36.6 C) (Oral)   Resp 18   Ht '5\' 2"'$  (1.575 m)   Wt (!) 170.1 kg   SpO2 93%   BMI 68.59 kg/m    General exam: Appears calm and comfortable Respiratory system: Clear to auscultation. Respiratory effort normal. Cardiovascular system: S1 & S2 heard, RRR. No murmurs, rubs, gallops or clicks. Gastrointestinal system: Abdomen is nondistended, soft and nontender.  Normal bowel sounds heard. Central nervous system: Alert and oriented. No focal neurological deficits. Skin: No cyanosis. No rashes  Psychiatry: Judgement and insight appear normal. Mood & affect appropriate.   Condition at discharge: stable  The results of significant diagnostics from this hospitalization (including imaging, microbiology, ancillary and laboratory) are listed below for reference.   Imaging Studies: VAS [place] LOWER EXTREMITY VENOUS (DVT)  Result Date: 08/26/2022  Lower Venous DVT Study Patient Name:  Yvette Gibbs  Date of Exam:   08/26/2022 Medical Rec #: [id number]         Accession #:    [id number] Date of Birth: 18-Mar-1991         Patient Gender: F Patient Age:   60 years Exam Location:  [facility] Procedure:      VAS [place] LOWER EXTREMITY VENOUS (DVT) Referring Phys: Elease Etienne --------------------------------------------------------------------------------  Indications: Edema.  Limitations: Patient  pain with probe contact, more severe with guarding with compression attempts and body habitus. Comparison Study: No prior studies. Performing Technologist: Darlin Coco RDMS, RVT  Examination Guidelines: A complete evaluation includes B-mode imaging, spectral Doppler, color Doppler, and power Doppler as needed of all accessible portions of each vessel. Bilateral testing is considered an integral part of a complete examination. Limited examinations for reoccurring indications may be performed as noted. The reflux portion of the exam is performed with the patient in reverse Trendelenburg.  +---------+---------------+---------+-----------+---------------+--------------+ RIGHT    CompressibilityPhasicitySpontaneityProperties     Thrombus Aging +---------+---------------+---------+-----------+---------------+--------------+ CFV                     Yes      Yes        Patent by color               +---------+---------------+---------+-----------+---------------+--------------+ SFJ                     Yes      Yes        Patent by color               +---------+---------------+---------+-----------+---------------+--------------+ FV Prox                 Yes      Yes        Patent by color               +---------+---------------+---------+-----------+---------------+--------------+ FV Mid                  Yes      Yes        Patent by color               +---------+---------------+---------+-----------+---------------+--------------+ FV Distal               Yes      Yes        Patent by color               +---------+---------------+---------+-----------+---------------+--------------+ PFV                     Yes      Yes        Patent by color               +---------+---------------+---------+-----------+---------------+--------------+ POP      Full           Yes      Yes                                       +---------+---------------+---------+-----------+---------------+--------------+  PTV                     Yes      Yes        Patent by color               +---------+---------------+---------+-----------+---------------+--------------+ PERO                    Yes      Yes        Patent by color               +---------+---------------+---------+-----------+---------------+--------------+   +---------+---------------+---------+-----------+---------------+--------------+ LEFT     CompressibilityPhasicitySpontaneityProperties     Thrombus Aging +---------+---------------+---------+-----------+---------------+--------------+ CFV                     Yes      Yes        Patent by color               +---------+---------------+---------+-----------+---------------+--------------+ SFJ                     Yes      Yes        Patent by color               +---------+---------------+---------+-----------+---------------+--------------+ FV Prox                 Yes      Yes        Patent by color               +---------+---------------+---------+-----------+---------------+--------------+ FV Mid                  Yes      Yes        Patent by color               +---------+---------------+---------+-----------+---------------+--------------+ FV Distal               Yes      Yes        Patent by color               +---------+---------------+---------+-----------+---------------+--------------+ PFV                     Yes      Yes        Patent by color               +---------+---------------+---------+-----------+---------------+--------------+ POP      Full           Yes      Yes                                      +---------+---------------+---------+-----------+---------------+--------------+ PTV                     Yes      Yes        Patent by color               +---------+---------------+---------+-----------+---------------+--------------+  PERO                    Yes      Yes        Patent by color               +---------+---------------+---------+-----------+---------------+--------------+  Summary: RIGHT: - There is no evidence of deep vein thrombosis in the lower extremity. However, portions of this examination were limited- see technologist comments above.  - No cystic structure found in the popliteal fossa.  LEFT: - There is no evidence of deep vein thrombosis in the lower extremity. However, portions of this examination were limited- see technologist comments above.  - No cystic structure found in the popliteal fossa.  *See table(s) above for measurements and observations. Electronically signed by Monica Martinez MD on 08/26/2022 at 10:00:00 PM.    Final    ECHOCARDIOGRAM COMPLETE  Result Date: 08/26/2022    ECHOCARDIOGRAM REPORT   Patient Name:   Yvette Gibbs Date of Exam: 08/26/2022 Medical Rec #:  [id number]        Height:       62.0 in Accession #:    [id number]       Weight:       375.0 lb Date of Birth:  07-Feb-1991        BSA:          2.497 m Patient Age:    31 years         BP:           140/77 mmHg Patient Gender: F                HR:           96 bpm. Exam Location:  Inpatient Procedure: 2D Echo and Intracardiac Opacification Agent Indications:    CHF  History:        Patient has prior history of Echocardiogram examinations, most                 recent 05/05/2021. Risk Factors:Hypertension.  Sonographer:    Harvie Junior Referring Phys: Samella Parr  Sonographer Comments: Technically difficult study due to poor echo windows, no subcostal window and patient is obese. Image acquisition challenging due to patient body habitus. IMPRESSIONS  1. Left ventricular ejection fraction, by estimation, is 60 to 65%. The left ventricle has normal function. The left ventricle has no regional wall motion abnormalities. There is moderate left ventricular hypertrophy. Left ventricular diastolic parameters were normal.  2. Respiratory  related ventricular septal shift may be secondary to pulmonary process vs constrictive physiology.  3. Right ventricular systolic function is normal. The right ventricular size is normal. Tricuspid regurgitation signal is inadequate for assessing PA pressure.  4. The mitral valve is grossly normal. Trivial mitral valve regurgitation. No evidence of mitral stenosis.  5. The aortic valve is grossly normal. Unable to determine aortic valve morphology due to image quality. Aortic valve regurgitation is not visualized. No aortic stenosis is present. FINDINGS  Left Ventricle: Left ventricular ejection fraction, by estimation, is 60 to 65%. The left ventricle has normal function. The left ventricle has no regional wall motion abnormalities. Definity contrast agent was given IV to delineate the left ventricular  endocardial borders. The left ventricular internal cavity size was normal in size. There is moderate left ventricular hypertrophy. Left ventricular diastolic parameters were normal. Right Ventricle: The right ventricular size is normal. Right vetricular wall thickness was not well visualized. Right ventricular systolic function is normal. Tricuspid regurgitation signal is inadequate for assessing PA pressure. Left Atrium: Left atrial size was normal in size. Right Atrium: Right atrial size was not well visualized. Pericardium: Trivial pericardial effusion is present. Mitral Valve: The mitral valve is grossly normal. Trivial mitral valve regurgitation. No evidence of mitral valve  stenosis. Tricuspid Valve: The tricuspid valve is not well visualized. Tricuspid valve regurgitation is trivial. Aortic Valve: The aortic valve is grossly normal. Aortic valve regurgitation is not visualized. No aortic stenosis is present. Aortic valve mean gradient measures 6.0 mmHg. Aortic valve peak gradient measures 10.4 mmHg. Aortic valve area, by VTI measures  2.32 cm. Pulmonic Valve: The pulmonic valve was not well visualized.  Pulmonic valve regurgitation is trivial. Aorta: The aortic root is normal in size and structure. Venous: The inferior vena cava was not well visualized. IAS/Shunts: The interatrial septum was not well visualized.  LEFT VENTRICLE PLAX 2D LVIDd:         3.80 cm   Diastology LVIDs:         2.70 cm   LV e' medial:    10.00 cm/s LV PW:         1.30 cm   LV E/e' medial:  12.7 LV IVS:        1.30 cm   LV e' lateral:   16.80 cm/s LVOT diam:     2.00 cm   LV E/e' lateral: 7.6 LV SV:         70 LV SV Index:   28 LVOT Area:     3.14 cm  RIGHT VENTRICLE RV S prime:     12.20 cm/s TAPSE (M-mode): 1.9 cm LEFT ATRIUM         Index LA diam:    4.00 cm 1.60 cm/m  AORTIC VALVE                     PULMONIC VALVE AV Area (Vmax):    2.67 cm      PV Vmax:       1.23 m/s AV Area (Vmean):   2.35 cm      PV Peak grad:  6.1 mmHg AV Area (VTI):     2.32 cm AV Vmax:           161.00 cm/s AV Vmean:          116.000 cm/s AV VTI:            0.300 m AV Peak Grad:      10.4 mmHg AV Mean Grad:      6.0 mmHg LVOT Vmax:         137.00 cm/s LVOT Vmean:        86.800 cm/s LVOT VTI:          0.222 m LVOT/AV VTI ratio: 0.74  AORTA Ao Root diam: 3.20 cm MITRAL VALVE MV Area (PHT): 4.74 cm     SHUNTS MV Decel Time: 160 msec     Systemic VTI:  0.22 m MV E velocity: 127.00 cm/s  Systemic Diam: 2.00 cm MV A velocity: 79.60 cm/s MV E/A ratio:  1.60 Cherlynn Kaiser MD Electronically signed by Cherlynn Kaiser MD Signature Date/Time: 08/26/2022/9:48:00 AM    Final    CT HEAD WO CONTRAST (5MM)  Result Date: 08/25/2022 CLINICAL DATA:  Mental status change, unknown cause EXAM: CT HEAD WITHOUT CONTRAST TECHNIQUE: Contiguous axial images were obtained from the base of the skull through the vertex without intravenous contrast. RADIATION DOSE REDUCTION: This exam was performed according to the departmental dose-optimization program which includes automated exposure control, adjustment of the mA and/or kV according to patient size and/or use of iterative  reconstruction technique. COMPARISON:  None Available. FINDINGS: Brain: No evidence of large-territorial acute infarction. No parenchymal hemorrhage. No mass lesion. No extra-axial collection. No  mass effect or midline shift. No hydrocephalus. Basilar cisterns are patent. Vascular: No hyperdense vessel. Skull: No acute fracture or focal lesion. Sinuses/Orbits: Paranasal sinuses and mastoid air cells are clear. The orbits are unremarkable. Other: None. IMPRESSION: No acute intracranial abnormality. Electronically Signed   By: Iven Finn M.D.   On: 08/25/2022 02:07   DG Chest Port 1 View  Result Date: 08/24/2022 CLINICAL DATA:  Short of breath EXAM: PORTABLE CHEST 1 VIEW COMPARISON:  07/23/2022 FINDINGS: Single frontal view of the chest demonstrates stable enlargement of the cardiac silhouette. Lung volumes are diminished, with increased central vascular congestion since prior exam. Patchy bilateral perihilar airspace disease. No effusion or pneumothorax. IMPRESSION: 1. Increased vascular congestion, with bilateral perihilar airspace disease most consistent with edema. 2. Stable enlarged cardiac silhouette. Electronically Signed   By: Randa Ngo M.D.   On: 08/24/2022 23:33    Microbiology: Results for orders placed or performed during the hospital encounter of 08/24/22  Resp panel by RT-PCR (RSV, Flu A&B, Covid) Anterior Nasal Swab     Status: None   Collection Time: 08/25/22  2:54 AM   Specimen: Anterior Nasal Swab  Result Value Ref Range Status   SARS Coronavirus 2 by RT PCR NEGATIVE NEGATIVE Final   Influenza A by PCR NEGATIVE NEGATIVE Final   Influenza B by PCR NEGATIVE NEGATIVE Final    Comment: (NOTE) The Xpert Xpress SARS-CoV-2/FLU/RSV plus assay is intended as an aid in the diagnosis of influenza from Nasopharyngeal swab specimens and should not be used as a sole basis for treatment. Nasal washings and aspirates are unacceptable for Xpert Xpress SARS-CoV-2/FLU/RSV testing.  Fact  Sheet for Patients: EntrepreneurPulse.com.au  Fact Sheet for Healthcare Providers: IncredibleEmployment.be  This test is not yet approved or cleared by the [place] FDA and has been authorized for detection and/or diagnosis of SARS-CoV-2 by FDA under an Emergency Use Authorization (EUA). This EUA will remain in effect (meaning this test can be used) for the duration of the COVID-19 declaration under Section 564(b)(1) of the Act, 21 U.S.C. section 360bbb-3(b)(1), unless the authorization is terminated or revoked.     Resp Syncytial Virus by PCR NEGATIVE NEGATIVE Final    Comment: (NOTE) Fact Sheet for Patients: EntrepreneurPulse.com.au  Fact Sheet for Healthcare Providers: IncredibleEmployment.be  This test is not yet approved or cleared by the [place] FDA and has been authorized for detection and/or diagnosis of SARS-CoV-2 by FDA under an Emergency Use Authorization (EUA). This EUA will remain in effect (meaning this test can be used) for the duration of the COVID-19 declaration under Section 564(b)(1) of the Act, 21 U.S.C. section 360bbb-3(b)(1), unless the authorization is terminated or revoked.  Performed at [facility], [place] [address], [place], [place] [address]   Respiratory (~20 pathogens) panel by PCR     Status: None   Collection Time: 08/25/22  1:07 PM   Specimen: Nasopharyngeal Swab; Respiratory  Result Value Ref Range Status   Adenovirus NOT DETECTED NOT DETECTED Final   Coronavirus 229E NOT DETECTED NOT DETECTED Final    Comment: (NOTE) The Coronavirus on the Respiratory Panel, DOES NOT test for the novel  Coronavirus (2019 nCoV)    Coronavirus HKU1 NOT DETECTED NOT DETECTED Final   Coronavirus NL63 NOT DETECTED NOT DETECTED Final   Coronavirus OC43 NOT DETECTED NOT DETECTED Final   Metapneumovirus NOT DETECTED NOT DETECTED Final   Rhinovirus / Enterovirus NOT DETECTED  NOT DETECTED Final   Influenza A NOT DETECTED NOT DETECTED Final  Influenza B NOT DETECTED NOT DETECTED Final   Parainfluenza Virus 1 NOT DETECTED NOT DETECTED Final   Parainfluenza Virus 2 NOT DETECTED NOT DETECTED Final   Parainfluenza Virus 3 NOT DETECTED NOT DETECTED Final   Parainfluenza Virus 4 NOT DETECTED NOT DETECTED Final   Respiratory Syncytial Virus NOT DETECTED NOT DETECTED Final   Bordetella pertussis NOT DETECTED NOT DETECTED Final   Bordetella Parapertussis NOT DETECTED NOT DETECTED Final   Chlamydophila pneumoniae NOT DETECTED NOT DETECTED Final   Mycoplasma pneumoniae NOT DETECTED NOT DETECTED Final    Comment: Performed at [facility], [place] [address] [place], [place] [address]    Labs: CBC: Recent Labs  Lab 08/24/22 2345 08/25/22 0101 08/25/22 0805 08/26/22 0250  WBC 7.2  --  9.4 9.4  NEUTROABS 5.3  --   --   --   HGB 11.4* 13.3 11.5* 11.1*  HCT 38.6 39.0 39.3 37.5  MCV 87.1  --  87.1 86.2  PLT 274  --  284 [id number]   Basic Metabolic Panel: Recent Labs  Lab 08/24/22 2345 08/25/22 0101 08/25/22 0805 08/26/22 0519  NA 136 136  --  136  K 4.4 4.5  --  4.6  CL 97*  --   --  96*  CO2 30  --   --  29  GLUCOSE 208*  --   --  239*  BUN 8  --   --  13  CREATININE 0.57  --  0.70 0.65  CALCIUM 8.8*  --   --  9.3   Liver Function Tests: Recent Labs  Lab 08/24/22 2345  AST 26  ALT 26  ALKPHOS 73  BILITOT 0.3  PROT 7.6  ALBUMIN 3.0*   CBG: Recent Labs  Lab 08/26/22 2125 08/27/22 0739 08/27/22 1153 08/27/22 1557 08/27/22 1605  GLUCAP 374* 223* 310* 418* 426*    Discharge time spent: 35 minutes.  Signed: Cordelia Poche, MD [facility] 08/27/2022

## 2022-08-28 NOTE — TOC Transition Note (Signed)
Transition of Care (TOC) - CM/SW Discharge Note Marvetta Gibbons RN,BSN Transitions of Care Unit 4NP (Non Trauma)- RN Case Manager See Treatment Team for direct Phone #   Patient Details  Name: Christopher Giang MRN: [id number] Date of Birth: 12-Nov-1990  Transition of Care [facility]) CM/SW Contact:  Dawayne Patricia, RN Phone Number: 08/28/2022, 10:33 AM   Clinical Narrative:    CM spoke with pt at bedside regarding Bipap, sister also present. Per pt she has not had a Bipap or Trilogy since 2022. Pt reports that the Bipap was "broken" and she returned it, she also returned the Trilogy. Pt also states that she does not have home 02 at this time either. Pt states that she prefers the Bipap over the Trilogy for home.  CM will reach out to [employer] to further investigate Bipap vs Trilogy situation in the home.   After further review by [employer]- pt does not have Trilogy- was discontinued by Dr Halford Chessman in 2022, not sure what happened with the Bipap and home 02-they are still looking into that.  Per [employer] they can re-auth pt with either Bipap or NIV- but would need further testing done prior to discharge- For NIV- would need to repeat PFT For Bipap- would need both PFT and overnight pulse oximetry done on [facility]  Info has been shared with attending, and he is also speaking with Pulmonary- Dr. Lamonte Sakai- will get back with CM for plan NIV vs Bipap need  1600- Pulmonary has decided that pt can f/u with them outpt for Bipap needs- no further needs from [facility] at this time- MD to transition pt home with outpt follow up.    Final next level of care: Home/Self Care Barriers to Discharge: No Barriers Identified   Patient Goals and CMS Choice CMS Medicare.gov Compare Post Acute Care list provided to:: Patient Choice offered to / list presented to : NA  Discharge Placement                 Home        Discharge Plan and Services Additional resources added to the After Visit Summary for     Discharge  Planning Services: CM Consult Post Acute Care Choice: Durable Medical Equipment          DME Arranged: Bipap DME Agency: [employer]       HH Arranged: NA HH Agency: NA        Social Determinants of Health (SDOH) Interventions SDOH Screenings   Food Insecurity: Food Insecurity Present (08/25/2022)  Housing: Low Risk  (08/25/2022)  Transportation Needs: Unmet Transportation Needs (08/25/2022)  Utilities: Not At Risk (08/25/2022)  Depression (PHQ2-9): High Risk (07/01/2022)  Financial Resource Strain: Low Risk  (05/05/2021)  Tobacco Use: Medium Risk (08/25/2022)     Readmission Risk Interventions    12/21/2020   11:54 AM  Readmission Risk Prevention Plan  Transportation Screening Complete  Medication Review Press photographer) Complete  HRI or [place] Complete  SW Recovery Care/Counseling Consult Complete  [place] Not Applicable

## 2022-08-28 NOTE — Telephone Encounter (Signed)
Called and spoke with patient. Patient verbalized understanding. Got patient scheduled with Dr. Halford Chessman.   Nothing further needed.

## 2022-09-01 DIAGNOSIS — F321 Major depressive disorder, single episode, moderate: Secondary | ICD-10-CM | POA: Diagnosis not present

## 2022-09-02 ENCOUNTER — Telehealth: Payer: Self-pay

## 2022-09-02 NOTE — Transitions of Care (Post Inpatient/ED Visit) (Signed)
   09/02/2022  Name: Ranasia Massarelli MRN: [id number] DOB: 17-Apr-1991  Today's TOC FU Call Status: Today's TOC FU Call Status:: Unsuccessul Call (1st Attempt) Unsuccessful Call (1st Attempt) Date: 09/02/22  Attempted to reach the patient regarding the most recent Inpatient/ED visit.  Follow Up Plan: Additional outreach attempts will be made to reach the patient to complete the Transitions of Care (Post Inpatient/ED visit) call.   Signature Eden Lathe, RN

## 2022-09-03 ENCOUNTER — Telehealth: Payer: Self-pay

## 2022-09-03 DIAGNOSIS — G8929 Other chronic pain: Secondary | ICD-10-CM

## 2022-09-03 DIAGNOSIS — I10 Essential (primary) hypertension: Secondary | ICD-10-CM

## 2022-09-03 DIAGNOSIS — E119 Type 2 diabetes mellitus without complications: Secondary | ICD-10-CM

## 2022-09-03 MED ORDER — OZEMPIC (0.25 OR 0.5 MG/DOSE) 2 MG/3ML ~~LOC~~ SOPN: 0.2500 mg | PEN_INJECTOR | SUBCUTANEOUS | 2 refills | Status: DC

## 2022-09-03 MED ORDER — ROSUVASTATIN CALCIUM 20 MG PO TABS: 20.0000 mg | ORAL_TABLET | Freq: Every day | ORAL | 1 refills | Status: DC

## 2022-09-03 MED ORDER — PROPRANOLOL HCL 10 MG PO TABS: 10.0000 mg | ORAL_TABLET | Freq: Two times a day (BID) | ORAL | 1 refills | Status: DC

## 2022-09-03 MED ORDER — ACCU-CHEK GUIDE VI STRP: ORAL_STRIP | 2 refills | Status: DC

## 2022-09-03 MED ORDER — ACCU-CHEK SOFTCLIX LANCETS MISC: 2 refills | Status: DC

## 2022-09-03 MED ORDER — ACCU-CHEK GUIDE W/DEVICE KIT: PACK | 0 refills | Status: DC

## 2022-09-03 NOTE — Telephone Encounter (Signed)
From the discharge call:   She said she is feeling better.   She has the albuterol inhaler and neb solution, lantus, losartan and symbicort. she said her pharmacy is waiting for auth from Ms Edwards,NP for ozempic, inderal and crestor. She also needs a nebulizer and glucometer.  She does not have the Ensure and I explained to her that insurance usually does not cover that when taken orally.  Follow-up Provider: Juluis Mire, NP 09/17/2022.

## 2022-09-03 NOTE — Transitions of Care (Post Inpatient/ED Visit) (Signed)
   09/03/2022  Name: Yvette Gibbs MRN: [id number] DOB: Dec 30, 1990  Today's TOC FU Call Status: Today's TOC FU Call Status:: Successful TOC FU Call Competed Unsuccessful Call (1st Attempt) Date: 09/02/22 [facility] FU Call Complete Date: 09/03/22  Transition Care Management Follow-up Telephone Call Date of Discharge: 08/27/22 Discharge Facility: [facility]) Type of Discharge: Inpatient Admission Primary Inpatient Discharge Diagnosis:: asthma exacerbation How have you been since you were released from the hospital?: Better Any questions or concerns?: No  Items Reviewed: Did you receive and understand the discharge instructions provided?: Yes Medications obtained and verified?: No Medications Not Reviewed Reasons:: Patient Needs Assistance Obtaining Medications (med list reviewed. She has the albuterol inhaler and neb solution, lantus, losartan and symbicort. she said her pharmacy is waiting for auth from Ms Edwards,NP for ozempic, inderal and crestor. She also needs a nebulizer and glucometer.) Any new allergies since your discharge?: No Dietary orders reviewed?: Yes Type of Diet Ordered:: heart healthy diabetic (She does not have the Ensure and I explained to her that insurance usually does not cover that when taken orally.) Do you have support at home?: Yes  Home Care and Equipment/Supplies: [place] Ordered?: No Any new equipment or medical supplies ordered?: No  Functional Questionnaire: Do you need assistance with bathing/showering or dressing?: No Do you need assistance with meal preparation?: No Do you need assistance with eating?: No Do you have difficulty maintaining continence: No Do you need assistance with getting out of bed/getting out of a chair/moving?: No Do you have difficulty managing or taking your medications?: No  Follow up appointments reviewed: PCP Follow-up appointment confirmed?: Yes Date of PCP follow-up appointment?: 09/17/22 Follow-up  Provider: Juluis Mire, NP Specialist Hospital Follow-up appointment confirmed?: Yes Date of Specialist follow-up appointment?: 08/27/22 Follow-Up Specialty Provider:: Dr Halford Chessman, 09/28/2022- cardiology Do you need transportation to your follow-up appointment?: No (She contacts her insurance company for rides to her appointments) Do you understand care options if your condition(s) worsen?: Yes-patient verbalized understanding    SIGNATURE Eden Lathe, RN

## 2022-09-03 NOTE — Telephone Encounter (Signed)
Yes ma'am, rxs for Accu Chek glucometer and supplies sent. Also sent Ozempic, propranolol, and rosuvastatin refills.

## 2022-09-05 ENCOUNTER — Other Ambulatory Visit (INDEPENDENT_AMBULATORY_CARE_PROVIDER_SITE_OTHER): Payer: Self-pay | Admitting: Primary Care

## 2022-09-05 DIAGNOSIS — J45901 Unspecified asthma with (acute) exacerbation: Secondary | ICD-10-CM

## 2022-09-07 ENCOUNTER — Other Ambulatory Visit: Payer: Self-pay

## 2022-09-14 DIAGNOSIS — Z419 Encounter for procedure for purposes other than remedying health state, unspecified: Secondary | ICD-10-CM | POA: Diagnosis not present

## 2022-09-14 DIAGNOSIS — F321 Major depressive disorder, single episode, moderate: Secondary | ICD-10-CM | POA: Diagnosis not present

## 2022-09-15 ENCOUNTER — Telehealth: Payer: Self-pay

## 2022-09-15 NOTE — Telephone Encounter (Signed)
Copied from [place] [phone]. Topic: General - Other >> Sep 15, 2022  2:30 PM [place] B wrote: Reason for CRM: Helene Kelp from [employer] called about needing office notes or face to face notes showing reason pt needs nebulizer. Fx # is [phone]

## 2022-09-15 NOTE — Telephone Encounter (Signed)
Returned [place] call and made aware that they are able to pull note from epic and they will need to pull hospital note. Helene Kelp don't have any questions or concerns

## 2022-09-17 ENCOUNTER — Ambulatory Visit (INDEPENDENT_AMBULATORY_CARE_PROVIDER_SITE_OTHER): Payer: No Typology Code available for payment source | Admitting: Primary Care

## 2022-09-17 NOTE — Telephone Encounter (Signed)
Tried contacting pt to schedule a virtual appt for her nebulizer machine pt didn't answer and was unable to lvm

## 2022-09-21 DIAGNOSIS — F321 Major depressive disorder, single episode, moderate: Secondary | ICD-10-CM | POA: Diagnosis not present

## 2022-09-23 ENCOUNTER — Telehealth: Payer: Self-pay | Admitting: Primary Care

## 2022-09-23 NOTE — Telephone Encounter (Signed)
Copied from CRM [id number]. Topic: General - Other >> Sep 23, 2022  9:17 AM [place] C wrote: Reason for CRM: Ethelene Browns with [employer] has called to share that the patient has bene unable to be reached to further confirm their order for a nebulizer   Order [id number]  Please contact the patient further if needed

## 2022-09-24 NOTE — Telephone Encounter (Signed)
Spoke to patient. She was given message from [employer]. Gave patient  order and phone number.

## 2022-09-25 ENCOUNTER — Ambulatory Visit (HOSPITAL_BASED_OUTPATIENT_CLINIC_OR_DEPARTMENT_OTHER): Payer: No Typology Code available for payment source | Admitting: Pulmonary Disease

## 2022-09-28 ENCOUNTER — Ambulatory Visit
Payer: No Typology Code available for payment source | Attending: Cardiovascular Disease | Admitting: Cardiovascular Disease

## 2022-10-01 ENCOUNTER — Ambulatory Visit (INDEPENDENT_AMBULATORY_CARE_PROVIDER_SITE_OTHER): Payer: No Typology Code available for payment source | Admitting: Primary Care

## 2022-10-06 ENCOUNTER — Emergency Department (HOSPITAL_COMMUNITY)
Admission: EM | Admit: 2022-10-06 | Discharge: 2022-10-07 | Disposition: A | Discharge status: Home / Self Care | Payer: No Typology Code available for payment source | Attending: Emergency Medicine | Admitting: Emergency Medicine

## 2022-10-06 ENCOUNTER — Encounter (HOSPITAL_COMMUNITY): Payer: Self-pay | Admitting: Emergency Medicine

## 2022-10-06 ENCOUNTER — Emergency Department (HOSPITAL_COMMUNITY): Payer: No Typology Code available for payment source

## 2022-10-06 DIAGNOSIS — R600 Localized edema: Secondary | ICD-10-CM | POA: Insufficient documentation

## 2022-10-06 DIAGNOSIS — Z794 Long term (current) use of insulin: Secondary | ICD-10-CM | POA: Diagnosis not present

## 2022-10-06 DIAGNOSIS — M79672 Pain in left foot: Secondary | ICD-10-CM

## 2022-10-06 NOTE — ED Triage Notes (Addendum)
Pain on top of L foot starting yesterday getting worse. Pt has significant pitting edema in both feet but worse in left foot. CHF hx. She denies injury that she is aware of. Strong pedal pulse intact.

## 2022-10-07 ENCOUNTER — Ambulatory Visit (HOSPITAL_COMMUNITY): Payer: No Typology Code available for payment source

## 2022-10-07 ENCOUNTER — Other Ambulatory Visit: Payer: Self-pay

## 2022-10-07 DIAGNOSIS — R600 Localized edema: Secondary | ICD-10-CM | POA: Diagnosis not present

## 2022-10-07 MED ORDER — OXYCODONE-ACETAMINOPHEN 5-325 MG PO TABS: 1.0000 | ORAL_TABLET | Freq: Three times a day (TID) | ORAL | 0 refills | Status: DC | PRN

## 2022-10-07 MED ORDER — CEPHALEXIN 250 MG PO CAPS
1000.0000 mg | ORAL_CAPSULE | Freq: Once | ORAL | Status: AC
  Administered 2022-10-07: 1000 mg via ORAL
  Filled 2022-10-07: qty 4

## 2022-10-07 MED ORDER — OXYCODONE-ACETAMINOPHEN 5-325 MG PO TABS
1.0000 | ORAL_TABLET | Freq: Once | ORAL | Status: AC
  Administered 2022-10-07: 1 via ORAL
  Filled 2022-10-07: qty 1

## 2022-10-07 MED ORDER — CEPHALEXIN 500 MG PO CAPS: 500.0000 mg | ORAL_CAPSULE | Freq: Four times a day (QID) | ORAL | 0 refills | Status: DC

## 2022-10-07 NOTE — ED Notes (Signed)
Ortho tech notified of order for crutches  

## 2022-10-07 NOTE — ED Provider Notes (Signed)
[place] EMERGENCY DEPARTMENT AT [facility] Provider Note   CSN: [id number] Arrival date & time: 10/06/22  2250     History  Chief Complaint  Patient presents with   Foot Pain    Yvette Gibbs is a 32 y.o. female.  32 year old female who presents ER today with atraumatic left foot swelling and pain and redness.  Patient states started a few days ago progressively worsened.  Its mostly the dorsum of her left foot.  Does not really extend up her leg.  She has no calf pain.  She states it does seem to be swollen.   Foot Pain       Home Medications Prior to Admission medications   Medication Sig Start Date End Date Taking? Authorizing Provider  cephALEXin (KEFLEX) 500 MG capsule Take 1 capsule (500 mg total) by mouth 4 (four) times daily. 10/07/22  Yes Carlisle Torgeson, Barbara Cower, MD  oxyCODONE-acetaminophen (PERCOCET) 5-325 MG tablet Take 1 tablet by mouth every 8 (eight) hours as needed. 10/07/22  Yes Everet Flagg, Barbara Cower, MD  Accu-Chek Softclix Lancets lancets Use to check blood sugar TID. E11.9 09/03/22   Hoy Register, MD  acetaminophen (TYLENOL) 500 MG tablet Take 1,000 mg by mouth every 6 (six) hours as needed for mild pain, fever or headache.    [provider]  albuterol (PROVENTIL) (2.5 MG/3ML) 0.083% nebulizer solution Take 3 mLs (2.5 mg total) by nebulization every 4 (four) hours as needed for wheezing or shortness of breath (Do not use in addition to albuterol inhaler). 08/27/22   Narda Bonds, MD  albuterol (VENTOLIN HFA) 108 (90 Base) MCG/ACT inhaler Inhale 2 puffs into the lungs every 6 (six) hours as needed for wheezing or shortness of breath (Do not use in addition to albuterol nebulizer). 08/27/22   Narda Bonds, MD  Blood Glucose Monitoring Suppl (ACCU-CHEK GUIDE) w/Device KIT Use to check blood sugar TID. E11.9 09/03/22   Hoy Register, MD  budesonide-formoterol (SYMBICORT) 160-4.5 MCG/ACT inhaler Inhale 2 puffs into the lungs 2 (two) times daily. 07/01/22    Grayce Sessions, NP  GLOBAL EASE INJECT PEN NEEDLES 32G X 4 MM MISC USE TO INJECT lantus EVERY DAY AS DIRECTED 07/03/22   Grayce Sessions, NP  glucose blood (ACCU-CHEK GUIDE) test strip Use to check blood sugar TID. E11.9 09/03/22   Hoy Register, MD  ibuprofen (ADVIL) 200 MG tablet Take 200 mg by mouth every 6 (six) hours as needed for fever, headache or mild pain.    [provider]  insulin glargine (LANTUS SOLOSTAR) 100 UNIT/ML Solostar Pen Inject 25 Units into the skin daily. 07/01/22   Grayce Sessions, NP  losartan (COZAAR) 25 MG tablet Take 1 tablet (25 mg total) by mouth daily. 08/28/22 11/26/22  Narda Bonds, MD  propranolol (INDERAL) 10 MG tablet Take 1 tablet (10 mg total) by mouth 2 (two) times daily. 09/03/22   Hoy Register, MD  rosuvastatin (CRESTOR) 20 MG tablet Take 1 tablet (20 mg total) by mouth daily. 09/03/22   Hoy Register, MD  Semaglutide,0.25 or 0.5MG /DOS, (OZEMPIC, 0.25 OR 0.5 MG/DOSE,) 2 MG/3ML SOPN Inject 0.25 mg into the skin once a week. For 4 weeks. Then, increase to 0.5 mg into the skin once a week thereafter. 09/03/22   Hoy Register, MD      Allergies    Bee pollen, Fish allergy, Lisinopril, and Metformin and related    Review of Systems   Review of Systems  Physical Exam Updated Vital Signs BP  138/71 (BP Location: Right Arm)   Pulse 97   Temp 98.2 F (36.8 C) (Oral)   Resp (!) 22   LMP 08/27/2022   SpO2 100%  Physical Exam Vitals and nursing note reviewed.  Constitutional:      Appearance: She is well-developed.  HENT:     Head: Normocephalic and atraumatic.  Eyes:     Pupils: Pupils are equal, round, and reactive to light.  Cardiovascular:     Rate and Rhythm: Normal rate and regular rhythm.  Pulmonary:     Effort: No respiratory distress.     Breath sounds: No stridor.  Abdominal:     General: There is no distension.  Musculoskeletal:     Cervical back: Normal range of motion.     Left lower leg: Edema (With  erythema, and patient palpation.  No fluctuant areas) present.  Skin:    General: Skin is warm and dry.  Neurological:     Mental Status: She is alert.     ED Results / Procedures / Treatments   Labs (all labs ordered are listed, but only abnormal results are displayed) Labs Reviewed - No data to display  EKG None  Radiology DG Foot Complete Left  Result Date: 10/06/2022 CLINICAL DATA:  Anterior left foot pain. EXAM: LEFT FOOT - COMPLETE 3+ VIEW COMPARISON:  None Available. FINDINGS: There is no evidence of fracture or dislocation. There is no evidence of arthropathy or other focal bone abnormality. Moderate severity dorsal soft tissue swelling is seen which may be, in part, related to the patient's body habitus. IMPRESSION: Moderate severity dorsal soft tissue swelling without evidence of acute fracture or dislocation. Electronically Signed   By: Aram Candela M.D.   On: 10/06/2022 23:17    Procedures Procedures    Medications Ordered in ED Medications  oxyCODONE-acetaminophen (PERCOCET/ROXICET) 5-325 MG per tablet 1 tablet (1 tablet Oral Given 10/07/22 0618)  cephALEXin (KEFLEX) capsule 1,000 mg (1,000 mg Oral Given 10/07/22 1610)    ED Course/ Medical Decision Making/ A&P                             Medical Decision Making Amount and/or Complexity of Data Reviewed Radiology: ordered.  Risk Prescription drug management.   32 year old female with atraumatic left foot redness, pain and swelling.  Likely cellulitis but cannot rule out DVT/provide get ultrasound.  Antibiotics and pain meds provided.  Ace wrap and crutches for comfort.  PCP follow-up if not improving in 4 to 5 days.  Final Clinical Impression(s) / ED Diagnoses Final diagnoses:  Foot pain, left    Rx / DC Orders ED Discharge Orders          Ordered    LE VENOUS        10/07/22 0604    cephALEXin (KEFLEX) 500 MG capsule  4 times daily        10/07/22 0611    oxyCODONE-acetaminophen (PERCOCET)  5-325 MG tablet  Every 8 hours PRN        10/07/22 0611              Katerine Morua, Barbara Cower, MD 10/07/22 [phone]

## 2022-10-07 NOTE — Progress Notes (Signed)
Orthopedic Tech Progress Note Patient Details:  Yvette Gibbs [birth year] [id number]  Ortho Devices Type of Ortho Device: Crutches Ortho Device/Splint Interventions: Ordered      Al Decant 10/07/2022, 6:22 AM

## 2022-10-13 DIAGNOSIS — F321 Major depressive disorder, single episode, moderate: Secondary | ICD-10-CM | POA: Diagnosis not present

## 2022-10-14 DIAGNOSIS — Z419 Encounter for procedure for purposes other than remedying health state, unspecified: Secondary | ICD-10-CM | POA: Diagnosis not present

## 2022-10-19 DIAGNOSIS — F321 Major depressive disorder, single episode, moderate: Secondary | ICD-10-CM | POA: Diagnosis not present

## 2022-10-22 ENCOUNTER — Ambulatory Visit: Payer: No Typology Code available for payment source | Admitting: Pharmacist

## 2022-10-23 ENCOUNTER — Ambulatory Visit: Payer: No Typology Code available for payment source | Admitting: Dietician

## 2022-10-28 ENCOUNTER — Emergency Department (HOSPITAL_COMMUNITY)
Admission: EM | Admit: 2022-10-28 | Discharge: 2022-10-29 | Disposition: A | Discharge status: Home / Self Care | Payer: No Typology Code available for payment source | Attending: Emergency Medicine | Admitting: Emergency Medicine

## 2022-10-28 ENCOUNTER — Other Ambulatory Visit: Payer: Self-pay

## 2022-10-28 DIAGNOSIS — X509XXA Other and unspecified overexertion or strenuous movements or postures, initial encounter: Secondary | ICD-10-CM | POA: Insufficient documentation

## 2022-10-28 DIAGNOSIS — S63502A Unspecified sprain of left wrist, initial encounter: Secondary | ICD-10-CM | POA: Insufficient documentation

## 2022-10-28 DIAGNOSIS — S6992XA Unspecified injury of left wrist, hand and finger(s), initial encounter: Secondary | ICD-10-CM | POA: Diagnosis present

## 2022-10-28 NOTE — ED Triage Notes (Signed)
Patient BIB EMS from home due to left wrist and left hand pain. Patient states yesterday she picked up her daughter and stated ever since then has had pain. Patient states she tried heat and cold compress as well as tylenol with no relief. Patient rates pain 10/10. Patient is A&Ox4. Patient states pain with rotation and moving fingers.

## 2022-10-29 ENCOUNTER — Emergency Department (HOSPITAL_COMMUNITY): Payer: No Typology Code available for payment source

## 2022-10-29 DIAGNOSIS — S63502A Unspecified sprain of left wrist, initial encounter: Secondary | ICD-10-CM | POA: Diagnosis not present

## 2022-10-29 MED ORDER — IBUPROFEN 800 MG PO TABS
800.0000 mg | ORAL_TABLET | Freq: Once | ORAL | Status: AC
  Administered 2022-10-29: 800 mg via ORAL
  Filled 2022-10-29: qty 1

## 2022-10-29 MED ORDER — IBUPROFEN 600 MG PO TABS: 600.0000 mg | ORAL_TABLET | Freq: Four times a day (QID) | ORAL | 0 refills | Status: DC | PRN

## 2022-10-29 NOTE — Progress Notes (Signed)
Orthopedic Tech Progress Note Patient Details:  Yvette Gibbs [birth year] [id number]  Ortho Devices Type of Ortho Device: Velcro wrist splint Ortho Device/Splint Location: LUE Ortho Device/Splint Interventions: Ordered, Application, Adjustment   Post Interventions Patient Tolerated: Well, Fair Instructions Provided: Care of device  Donald Pore 10/29/2022, 1:13 AM

## 2022-10-29 NOTE — ED Provider Notes (Signed)
[facility]PT [facility] Emergency Department Provider Note MRN:  [id number]  Arrival date & time: 10/29/22     Chief Complaint   Left Wrist Pain   History of Present Illness   Yvette Gibbs is a 32 y.o. year-old female presents to the ED with chief complaint of left wrist and hand pain.  States that she picked up her child yesterday and felt a pop. She says that she has had trouble moving the hand and wrist today because of pain.  Has tried heat and ice.  Has tried compresses and Tylenol without relief.  History provided by patient.   Review of Systems  Pertinent positive and negative review of systems noted in HPI.    Physical Exam   Vitals:   10/28/22 2336 10/28/22 2339  BP:  (!) 148/106  Pulse:  94  Resp:  20  Temp:  98.3 F (36.8 C)  SpO2: 99% 100%    CONSTITUTIONAL:  well-appearing, NAD NEURO:  Alert and oriented x 3, CN 3-12 grossly intact EYES:  eyes equal and reactive ENT/NECK:  Supple, no stridor  CARDIO:  appears well-perfused, normal cap refill, normal radial pulse PULM:  No respiratory distress,  GI/GU:  non-distended,  MSK/SPINE: Strength testing deferred due to pain, no obvious swelling, redness, or deformity SKIN:  no rash, atraumatic   *Additional and/or pertinent findings included in MDM below  Diagnostic and Interventional Summary    EKG Interpretation  Date/Time:    Ventricular Rate:    PR Interval:    QRS Duration:   QT Interval:    QTC Calculation:   R Axis:     Text Interpretation:         Labs Reviewed - No data to display  DG Hand Complete Left  Final Result    DG Wrist Complete Left  Final Result      Medications  ibuprofen (ADVIL) tablet 800 mg (800 mg Oral Given 10/29/22 0107)     Procedures  /  Critical Care Procedures  ED Course and Medical Decision Making  I have reviewed the triage vital signs, the nursing notes, and pertinent available records from the EMR.  Social Determinants Affecting  Complexity of Care: Patient has no clinically significant social determinants affecting this chief complaint..   ED Course:    Medical Decision Making Patient presents with injury to left wrist and hand.  DDx includes, fracture, strain, or sprain.  Consultants: none  Plain films reveal no fracture or dislocation.  Pt advised to follow up with PCP and/or orthopedics. Patient given splint while in ED, conservative therapy such as RICE recommended and discussed.   Patient will be discharged home & is agreeable with above plan. Returns precautions discussed. Pt appears safe for discharge.   Amount and/or Complexity of Data Reviewed Radiology: ordered and independent interpretation performed.    Details: No fractures seen  Risk Prescription drug management.     Consultants: No consultations were needed in caring for this patient.   Treatment and Plan: Emergency department workup does not suggest an emergent condition requiring admission or immediate intervention beyond  what has been performed at this time. The patient is safe for discharge and has  been instructed to return immediately for worsening symptoms, change in  symptoms or any other concerns    Final Clinical Impressions(s) / ED Diagnoses     ICD-10-CM   1. Sprain of left wrist, initial encounter  Y78.295A       ED Discharge Orders  Ordered    ibuprofen (ADVIL) 600 MG tablet  Every 6 hours PRN        10/29/22 0109              Discharge Instructions Discussed with and Provided to Patient:   Discharge Instructions   None      Roxy Horseman, PA-C 10/29/22 0109    Sabas Sous, MD 10/29/22 0730

## 2022-11-11 ENCOUNTER — Ambulatory Visit (HOSPITAL_BASED_OUTPATIENT_CLINIC_OR_DEPARTMENT_OTHER): Payer: No Typology Code available for payment source | Admitting: Pulmonary Disease

## 2022-11-14 DIAGNOSIS — Z419 Encounter for procedure for purposes other than remedying health state, unspecified: Secondary | ICD-10-CM | POA: Diagnosis not present

## 2022-11-19 ENCOUNTER — Ambulatory Visit (HOSPITAL_BASED_OUTPATIENT_CLINIC_OR_DEPARTMENT_OTHER): Payer: No Typology Code available for payment source | Admitting: Pulmonary Disease

## 2022-11-22 ENCOUNTER — Other Ambulatory Visit: Payer: Self-pay

## 2022-11-22 ENCOUNTER — Inpatient Hospital Stay (HOSPITAL_COMMUNITY)
Admission: EM | Admit: 2022-11-22 | Discharge: 2022-11-27 | DRG: 202 | Disposition: A | Discharge status: Home / Self Care | Payer: No Typology Code available for payment source | Attending: Internal Medicine | Admitting: Internal Medicine

## 2022-11-22 ENCOUNTER — Emergency Department (HOSPITAL_COMMUNITY): Payer: No Typology Code available for payment source

## 2022-11-22 DIAGNOSIS — I110 Hypertensive heart disease with heart failure: Secondary | ICD-10-CM | POA: Diagnosis present

## 2022-11-22 DIAGNOSIS — E1165 Type 2 diabetes mellitus with hyperglycemia: Secondary | ICD-10-CM | POA: Diagnosis present

## 2022-11-22 DIAGNOSIS — I5033 Acute on chronic diastolic (congestive) heart failure: Secondary | ICD-10-CM | POA: Diagnosis present

## 2022-11-22 DIAGNOSIS — I10 Essential (primary) hypertension: Secondary | ICD-10-CM | POA: Diagnosis present

## 2022-11-22 DIAGNOSIS — E662 Morbid (severe) obesity with alveolar hypoventilation: Secondary | ICD-10-CM

## 2022-11-22 DIAGNOSIS — E119 Type 2 diabetes mellitus without complications: Secondary | ICD-10-CM

## 2022-11-22 DIAGNOSIS — J9621 Acute and chronic respiratory failure with hypoxia: Secondary | ICD-10-CM | POA: Diagnosis present

## 2022-11-22 DIAGNOSIS — J9622 Acute and chronic respiratory failure with hypercapnia: Secondary | ICD-10-CM | POA: Diagnosis present

## 2022-11-22 DIAGNOSIS — E8729 Other acidosis: Secondary | ICD-10-CM | POA: Diagnosis present

## 2022-11-22 DIAGNOSIS — G4733 Obstructive sleep apnea (adult) (pediatric): Secondary | ICD-10-CM | POA: Diagnosis present

## 2022-11-22 DIAGNOSIS — Z87891 Personal history of nicotine dependence: Secondary | ICD-10-CM

## 2022-11-22 DIAGNOSIS — Z91013 Allergy to seafood: Secondary | ICD-10-CM

## 2022-11-22 DIAGNOSIS — E785 Hyperlipidemia, unspecified: Secondary | ICD-10-CM | POA: Diagnosis present

## 2022-11-22 DIAGNOSIS — J45901 Unspecified asthma with (acute) exacerbation: Secondary | ICD-10-CM | POA: Diagnosis not present

## 2022-11-22 DIAGNOSIS — Z7985 Long-term (current) use of injectable non-insulin antidiabetic drugs: Secondary | ICD-10-CM

## 2022-11-22 DIAGNOSIS — Z91030 Bee allergy status: Secondary | ICD-10-CM

## 2022-11-22 DIAGNOSIS — Z888 Allergy status to other drugs, medicaments and biological substances status: Secondary | ICD-10-CM

## 2022-11-22 DIAGNOSIS — Z833 Family history of diabetes mellitus: Secondary | ICD-10-CM

## 2022-11-22 DIAGNOSIS — Z7951 Long term (current) use of inhaled steroids: Secondary | ICD-10-CM

## 2022-11-22 DIAGNOSIS — J4520 Mild intermittent asthma, uncomplicated: Secondary | ICD-10-CM

## 2022-11-22 DIAGNOSIS — I4510 Unspecified right bundle-branch block: Secondary | ICD-10-CM | POA: Diagnosis present

## 2022-11-22 DIAGNOSIS — Z794 Long term (current) use of insulin: Secondary | ICD-10-CM

## 2022-11-22 DIAGNOSIS — Z79899 Other long term (current) drug therapy: Secondary | ICD-10-CM

## 2022-11-22 DIAGNOSIS — K219 Gastro-esophageal reflux disease without esophagitis: Secondary | ICD-10-CM | POA: Diagnosis present

## 2022-11-22 DIAGNOSIS — Z6841 Body Mass Index (BMI) 40.0 and over, adult: Secondary | ICD-10-CM

## 2022-11-22 MED ORDER — IPRATROPIUM BROMIDE 0.02 % IN SOLN
RESPIRATORY_TRACT | Status: AC
  Administered 2022-11-22: 0.5 mg via RESPIRATORY_TRACT
  Filled 2022-11-22: qty 2.5

## 2022-11-22 MED ORDER — ACETAMINOPHEN 500 MG PO TABS
1000.0000 mg | ORAL_TABLET | Freq: Once | ORAL | Status: DC
  Filled 2022-11-22: qty 2

## 2022-11-22 MED ORDER — ALBUTEROL SULFATE (2.5 MG/3ML) 0.083% IN NEBU: 10.0000 mg/h | INHALATION_SOLUTION | RESPIRATORY_TRACT | Status: AC

## 2022-11-22 MED ORDER — IPRATROPIUM BROMIDE 0.02 % IN SOLN: 0.5000 mg | Freq: Once | RESPIRATORY_TRACT | Status: AC

## 2022-11-22 MED ORDER — ALBUTEROL SULFATE (2.5 MG/3ML) 0.083% IN NEBU
INHALATION_SOLUTION | RESPIRATORY_TRACT | Status: AC
  Administered 2022-11-22: 10 mg
  Filled 2022-11-22: qty 12

## 2022-11-22 MED ORDER — LORAZEPAM 2 MG/ML IJ SOLN
0.5000 mg | Freq: Once | INTRAMUSCULAR | Status: AC
  Administered 2022-11-22: 0.5 mg via INTRAVENOUS
  Filled 2022-11-22: qty 1

## 2022-11-22 MED ORDER — PROCHLORPERAZINE EDISYLATE 10 MG/2ML IJ SOLN
5.0000 mg | Freq: Once | INTRAMUSCULAR | Status: AC
  Administered 2022-11-22: 5 mg via INTRAVENOUS
  Filled 2022-11-22: qty 2

## 2022-11-22 NOTE — ED Provider Notes (Signed)
[place] EMERGENCY DEPARTMENT AT [facility] Provider Note   CSN: [id number] Arrival date & time: 11/22/22  2154     History {Add pertinent medical, surgical, social history, OB history to HPI:1} Chief Complaint  Patient presents with   Shortness of Breath    Yvette Gibbs is a 32 y.o. female.  HPI   Patient with medical history including asthma, obesity, diabetes, hyperlipidemia, sleep apnea, presenting with asthma exacerbation.  Patient states that started about 3 days ago, states she was having chest tightness wheezing, cough congestion, states that she was taking her asthma medication without much relief.  She has no history of PEs or DVTs currently not on oral birth control no recent surgeries no long immobilizations.  She does notice that she has possible congestive heart failure but is not on any diuretics, she states that this feels like her typical asthma exacerbation, states that she is never had to be intubated before, she is in understanding fevers chills some pains nausea vomiting general body aches.  Review patient's chart has been assessed for similar presentation, back in March patient was admitted, placed on BiPAP, bronchodilators, steroids, she improved was discharged home on continued steroid inhalers, she did have an echocardiogram performed which was concern for possible CHF, she did have some vascular congestion noted on chest x-ray.    Home Medications Prior to Admission medications   Medication Sig Start Date End Date Taking? Authorizing Provider  Accu-Chek Softclix Lancets lancets Use to check blood sugar TID. E11.9 09/03/22   Hoy Register, MD  acetaminophen (TYLENOL) 500 MG tablet Take 1,000 mg by mouth every 6 (six) hours as needed for mild pain, fever or headache.    [provider]  albuterol (PROVENTIL) (2.5 MG/3ML) 0.083% nebulizer solution Take 3 mLs (2.5 mg total) by nebulization every 4 (four) hours as needed for wheezing or  shortness of breath (Do not use in addition to albuterol inhaler). 08/27/22   Narda Bonds, MD  albuterol (VENTOLIN HFA) 108 (90 Base) MCG/ACT inhaler Inhale 2 puffs into the lungs every 6 (six) hours as needed for wheezing or shortness of breath (Do not use in addition to albuterol nebulizer). 08/27/22   Narda Bonds, MD  Blood Glucose Monitoring Suppl (ACCU-CHEK GUIDE) w/Device KIT Use to check blood sugar TID. E11.9 09/03/22   Hoy Register, MD  budesonide-formoterol (SYMBICORT) 160-4.5 MCG/ACT inhaler Inhale 2 puffs into the lungs 2 (two) times daily. 07/01/22   Grayce Sessions, NP  cephALEXin (KEFLEX) 500 MG capsule Take 1 capsule (500 mg total) by mouth 4 (four) times daily. 10/07/22   Mesner, Barbara Cower, MD  GLOBAL EASE INJECT PEN NEEDLES 32G X 4 MM MISC USE TO INJECT lantus EVERY DAY AS DIRECTED 07/03/22   Grayce Sessions, NP  glucose blood (ACCU-CHEK GUIDE) test strip Use to check blood sugar TID. E11.9 09/03/22   Hoy Register, MD  ibuprofen (ADVIL) 200 MG tablet Take 200 mg by mouth every 6 (six) hours as needed for fever, headache or mild pain.    [provider]  ibuprofen (ADVIL) 600 MG tablet Take 1 tablet (600 mg total) by mouth every 6 (six) hours as needed. 10/29/22   Roxy Horseman, PA-C  insulin glargine (LANTUS SOLOSTAR) 100 UNIT/ML Solostar Pen Inject 25 Units into the skin daily. 07/01/22   Grayce Sessions, NP  losartan (COZAAR) 25 MG tablet Take 1 tablet (25 mg total) by mouth daily. 08/28/22 11/26/22  Narda Bonds, MD  oxyCODONE-acetaminophen (PERCOCET) [phone]  MG tablet Take 1 tablet by mouth every 8 (eight) hours as needed. 10/07/22   Mesner, Barbara Cower, MD  propranolol (INDERAL) 10 MG tablet Take 1 tablet (10 mg total) by mouth 2 (two) times daily. 09/03/22   Hoy Register, MD  rosuvastatin (CRESTOR) 20 MG tablet Take 1 tablet (20 mg total) by mouth daily. 09/03/22   Hoy Register, MD  Semaglutide,0.25 or 0.5MG /DOS, (OZEMPIC, 0.25 OR 0.5 MG/DOSE,) 2 MG/3ML SOPN  Inject 0.25 mg into the skin once a week. For 4 weeks. Then, increase to 0.5 mg into the skin once a week thereafter. 09/03/22   Hoy Register, MD      Allergies    Bee pollen, Fish allergy, Lisinopril, and Metformin and related    Review of Systems   Review of Systems  Constitutional:  Negative for chills and fever.  Respiratory:  Positive for chest tightness, shortness of breath and wheezing.   Cardiovascular:  Negative for chest pain.  Gastrointestinal:  Negative for abdominal pain.  Neurological:  Negative for headaches.    Physical Exam Updated Vital Signs BP (!) 141/70   Pulse 100   Temp 98.1 F (36.7 C) (Oral)   Resp 16   Wt (!) 164.7 kg   SpO2 100%   BMI 66.39 kg/m  Physical Exam Vitals and nursing note reviewed.  Constitutional:      General: She is not in acute distress.    Appearance: She is not ill-appearing.  HENT:     Head: Normocephalic and atraumatic.     Nose: No congestion.  Eyes:     Conjunctiva/sclera: Conjunctivae normal.  Neck:     Comments: No noted JVD Cardiovascular:     Rate and Rhythm: Regular rhythm. Tachycardia present.     Pulses: Normal pulses.     Heart sounds: No murmur heard.    No friction rub. No gallop.  Pulmonary:     Effort: Respiratory distress present.     Breath sounds: Wheezing present. No rhonchi or rales.     Comments: Patient is tachypneic, able to speak in short phrases, patient has audible wheezing, but is maintaining on room air at 94%, lung sounds are tight sounding with expiratory wheezing no rales or rhonchi present. Musculoskeletal:     Right lower leg: No edema.     Left lower leg: No edema.     Comments: No unilateral leg swelling no calf tenderness no palpable cords.  Skin:    General: Skin is warm and dry.  Neurological:     Mental Status: She is alert.  Psychiatric:        Mood and Affect: Mood normal.     ED Results / Procedures / Treatments   Labs (all labs ordered are listed, but only abnormal  results are displayed) Labs Reviewed  COMPREHENSIVE METABOLIC PANEL  CBC WITH DIFFERENTIAL/PLATELET  BLOOD GAS, VENOUS  BRAIN NATRIURETIC PEPTIDE  I-STAT BETA HCG BLOOD, ED ([facility], [facility], [facility] ONLY)  TROPONIN I (HIGH SENSITIVITY)    EKG EKG Interpretation  Date/Time:  Sunday November 22 2022 22:03:24 EDT Ventricular Rate:  104 PR Interval:  167 QRS Duration: 106 QT Interval:  378 QTC Calculation: 498 R Axis:   -37 Text Interpretation: Sinus tachycardia Probable left atrial enlargement Left axis deviation Consider RVH w/ secondary repol abnormality Prolonged QT interval Abnormal ECG Confirmed by Gerhard Munch [phone]) on 11/22/2022 10:24:26 PM  Radiology DG Chest Portable 1 View  Result Date: 11/22/2022 CLINICAL DATA:  Shortness of breath. EXAM: PORTABLE CHEST 1  VIEW COMPARISON:  August 24, 2022 FINDINGS: The cardiac silhouette is mildly enlarged and unchanged in size. There is stable prominence of the perihilar pulmonary vasculature. Mild atelectasis is seen along the infrahilar region on the right. There is no evidence of focal consolidation, pleural effusion or pneumothorax. The visualized skeletal structures are unremarkable. IMPRESSION: 1. Stable cardiomegaly with mild pulmonary vascular congestion. 2. Mild right infrahilar atelectasis. Electronically Signed   By: Aram Candela M.D.   On: 11/22/2022 22:27    Procedures Procedures  {Document cardiac monitor, telemetry assessment procedure when appropriate:1}  Medications Ordered in ED Medications  albuterol (PROVENTIL) (2.5 MG/3ML) 0.083% nebulizer solution ( Nebulization Canceled Entry 11/22/22 2226)  acetaminophen (TYLENOL) tablet 1,000 mg (1,000 mg Oral Not Given 11/22/22 2309)  ipratropium (ATROVENT) nebulizer solution 0.5 mg (0.5 mg Nebulization Given 11/22/22 2225)  albuterol (PROVENTIL) (2.5 MG/3ML) 0.083% nebulizer solution (10 mg  Given 11/22/22 2225)  prochlorperazine (COMPAZINE) injection 5 mg (5 mg Intravenous Given 11/22/22 2313)     ED Course/ Medical Decision Making/ A&P   {   Click here for ABCD2, HEART and other calculatorsREFRESH Note before signing :1}                          Medical Decision Making Amount and/or Complexity of Data Reviewed Labs: ordered. Radiology: ordered.  Risk OTC drugs. Prescription drug management.   This patient presents to the ED for concern of asthma exacerbation, this involves an extensive number of treatment options, and is a complaint that carries with it a high risk of complications and morbidity.  The differential diagnosis includes asthma exacerbation, COPD exacerbation, pneumonia, PE, ACS    Additional history obtained:  Additional history obtained from EMS External records from outside source obtained and reviewed including recent hospitalization   Co morbidities that complicate the patient evaluation  Diabetes, asthma, hyperlipidemia  Social Determinants of Health:  N/A    Lab Tests:  I Ordered, and personally interpreted labs.  The pertinent results include:  ***   Imaging Studies ordered:  I ordered imaging studies including chest x-ray I independently visualized and interpreted imaging which showed mild vascular congestion I agree with the radiologist interpretation   Cardiac Monitoring:  The patient was maintained on a cardiac monitor.  I personally viewed and interpreted the cardiac monitored which showed an underlying rhythm of: Sinus tach that signs of ischemia   Medicines ordered and prescription drug management:  I ordered medication including bronchodilator I have reviewed the patients home medicines and have made adjustments as needed  Critical Interventions:  Presents with mild respiratory distress, tight sounding chest with expiratory wheezing, we will start her on BiPAP. Patient was reassessed while BiPAP seems to be improving we will continue to monitor   Reevaluation:  Patient presented with asthma exacerbation my exam  she was tachycardic, hypertensive, no evidence of JVD or peripheral edema, she is very tight sounding with expiratory wheezing likely this is an  asthma asthma exacerbation, will start on BiPAP and continued nebulizer  Patient was found resting comfortably, states she is feeling slightly anxious with a mask on, will provide her with a small dose of Ativan.   Consultations Obtained:  I requested consultation with the ***,  and discussed lab and imaging findings as well as pertinent plan - they recommend: ***    Test Considered:  ***    Rule out ****    Dispostion and problem list  After consideration of the diagnostic  results and the patients response to treatment, I feel that the patent would benefit from ***.       {Document critical care time when appropriate:1} {Document review of labs and clinical decision tools ie heart score, Chads2Vasc2 etc:1}  {Document your independent review of radiology images, and any outside records:1} {Document your discussion with family members, caretakers, and with consultants:1} {Document social determinants of health affecting pt's care:1} {Document your decision making why or why not admission, treatments were needed:1} Final Clinical Impression(s) / ED Diagnoses Final diagnoses:  None    Rx / DC Orders ED Discharge Orders     None

## 2022-11-22 NOTE — ED Triage Notes (Signed)
Pt BIB EMS from home. presents with SOB/CP x3 days. Pain worsens with movement, no relief from inhaler.  HX asthma. Has had bipap previously  EMS VS: 190/100, P: 98, 100% on neb, 94% room air EMS noted audible wheezing, 10mg  albuterol, 1mg  Atrovent, 125 solumedrol, 2g mag

## 2022-11-23 ENCOUNTER — Encounter (HOSPITAL_COMMUNITY): Payer: Self-pay | Admitting: Internal Medicine

## 2022-11-23 DIAGNOSIS — J9622 Acute and chronic respiratory failure with hypercapnia: Secondary | ICD-10-CM | POA: Diagnosis present

## 2022-11-23 DIAGNOSIS — Z6841 Body Mass Index (BMI) 40.0 and over, adult: Secondary | ICD-10-CM | POA: Diagnosis not present

## 2022-11-23 DIAGNOSIS — G4733 Obstructive sleep apnea (adult) (pediatric): Secondary | ICD-10-CM | POA: Diagnosis not present

## 2022-11-23 DIAGNOSIS — Z91013 Allergy to seafood: Secondary | ICD-10-CM | POA: Diagnosis not present

## 2022-11-23 DIAGNOSIS — E8729 Other acidosis: Secondary | ICD-10-CM | POA: Diagnosis present

## 2022-11-23 DIAGNOSIS — E785 Hyperlipidemia, unspecified: Secondary | ICD-10-CM | POA: Diagnosis present

## 2022-11-23 DIAGNOSIS — Z7951 Long term (current) use of inhaled steroids: Secondary | ICD-10-CM | POA: Diagnosis not present

## 2022-11-23 DIAGNOSIS — Z91030 Bee allergy status: Secondary | ICD-10-CM | POA: Diagnosis not present

## 2022-11-23 DIAGNOSIS — J45901 Unspecified asthma with (acute) exacerbation: Secondary | ICD-10-CM | POA: Diagnosis present

## 2022-11-23 DIAGNOSIS — K219 Gastro-esophageal reflux disease without esophagitis: Secondary | ICD-10-CM | POA: Diagnosis present

## 2022-11-23 DIAGNOSIS — I4510 Unspecified right bundle-branch block: Secondary | ICD-10-CM | POA: Diagnosis present

## 2022-11-23 DIAGNOSIS — Z794 Long term (current) use of insulin: Secondary | ICD-10-CM | POA: Diagnosis not present

## 2022-11-23 DIAGNOSIS — E662 Morbid (severe) obesity with alveolar hypoventilation: Secondary | ICD-10-CM | POA: Diagnosis present

## 2022-11-23 DIAGNOSIS — I110 Hypertensive heart disease with heart failure: Secondary | ICD-10-CM | POA: Diagnosis present

## 2022-11-23 DIAGNOSIS — I5033 Acute on chronic diastolic (congestive) heart failure: Secondary | ICD-10-CM | POA: Diagnosis present

## 2022-11-23 DIAGNOSIS — Z87891 Personal history of nicotine dependence: Secondary | ICD-10-CM | POA: Diagnosis not present

## 2022-11-23 DIAGNOSIS — I10 Essential (primary) hypertension: Secondary | ICD-10-CM | POA: Diagnosis not present

## 2022-11-23 DIAGNOSIS — E1165 Type 2 diabetes mellitus with hyperglycemia: Secondary | ICD-10-CM | POA: Diagnosis present

## 2022-11-23 DIAGNOSIS — J9621 Acute and chronic respiratory failure with hypoxia: Secondary | ICD-10-CM | POA: Diagnosis present

## 2022-11-23 DIAGNOSIS — Z7985 Long-term (current) use of injectable non-insulin antidiabetic drugs: Secondary | ICD-10-CM | POA: Diagnosis not present

## 2022-11-23 DIAGNOSIS — Z79899 Other long term (current) drug therapy: Secondary | ICD-10-CM | POA: Diagnosis not present

## 2022-11-23 DIAGNOSIS — Z833 Family history of diabetes mellitus: Secondary | ICD-10-CM | POA: Diagnosis not present

## 2022-11-23 DIAGNOSIS — Z888 Allergy status to other drugs, medicaments and biological substances status: Secondary | ICD-10-CM | POA: Diagnosis not present

## 2022-11-23 LAB — TROPONIN I (HIGH SENSITIVITY)
Troponin I (High Sensitivity): 7 ng/L (ref <18)
Troponin I (High Sensitivity): 8 ng/L (ref <18)

## 2022-11-23 LAB — CBC WITH DIFFERENTIAL/PLATELET
Abs Immature Granulocytes: 0.02 10*3/uL (ref 0.00–0.07)
Basophils Absolute: 0 10*3/uL (ref 0.0–0.1)
Basophils Relative: 1 %
Eosinophils Absolute: 0.1 10*3/uL (ref 0.0–0.5)
Eosinophils Relative: 2 %
HCT: 41.7 % (ref 36.0–46.0)
Hemoglobin: 12 g/dL (ref 12.0–15.0)
Immature Granulocytes: 0 %
Lymphocytes Relative: 40 %
Lymphs Abs: 2.5 10*3/uL (ref 0.7–4.0)
MCH: 25.6 pg — ABNORMAL LOW (ref 26.0–34.0)
MCHC: 28.8 g/dL — ABNORMAL LOW (ref 30.0–36.0)
MCV: 89.1 fL (ref 80.0–100.0)
Monocytes Absolute: 0.7 10*3/uL (ref 0.1–1.0)
Monocytes Relative: 11 %
Neutro Abs: 2.9 10*3/uL (ref 1.7–7.7)
Neutrophils Relative %: 46 %
Platelets: 240 10*3/uL (ref 150–400)
RBC: 4.68 MIL/uL (ref 3.87–5.11)
RDW: 15 % (ref 11.5–15.5)
WBC: 6.3 10*3/uL (ref 4.0–10.5)
nRBC: 0 % (ref 0.0–0.2)

## 2022-11-23 LAB — CBG MONITORING, ED
Glucose-Capillary: 259 mg/dL — ABNORMAL HIGH (ref 70–99)
Glucose-Capillary: 274 mg/dL — ABNORMAL HIGH (ref 70–99)
Glucose-Capillary: 273 mg/dL — ABNORMAL HIGH (ref 70–99)

## 2022-11-23 LAB — COMPREHENSIVE METABOLIC PANEL
ALT: 24 U/L (ref 0–44)
AST: 29 U/L (ref 15–41)
Albumin: 3.2 g/dL — ABNORMAL LOW (ref 3.5–5.0)
Alkaline Phosphatase: 68 U/L (ref 38–126)
Anion gap: 8 (ref 5–15)
BUN: 6 mg/dL (ref 6–20)
CO2: 30 mmol/L (ref 22–32)
Calcium: 8.9 mg/dL (ref 8.9–10.3)
Chloride: 98 mmol/L (ref 98–111)
Creatinine, Ser: 0.67 mg/dL (ref 0.44–1.00)
GFR, Estimated: >60 mL/min (ref >60)
Glucose, Bld: 211 mg/dL — ABNORMAL HIGH (ref 70–99)
Potassium: 4.2 mmol/L (ref 3.5–5.1)
Sodium: 136 mmol/L (ref 135–145)
Total Bilirubin: 0.5 mg/dL (ref 0.3–1.2)
Total Protein: 7.1 g/dL (ref 6.5–8.1)

## 2022-11-23 LAB — BLOOD GAS, VENOUS
Acid-Base Excess: 5.8 mmol/L — ABNORMAL HIGH (ref 0.0–2.0)
Bicarbonate: 32.1 mmol/L — ABNORMAL HIGH (ref 20.0–28.0)
Drawn by: 66579
O2 Saturation: 93.2 %
Patient temperature: 36.6
pCO2, Ven: 52 mmHg (ref 44–60)
pH, Ven: 7.4 (ref 7.25–7.43)
pO2, Ven: 60 mmHg — ABNORMAL HIGH (ref 32–45)

## 2022-11-23 LAB — I-STAT VENOUS BLOOD GAS, ED
Acid-Base Excess: 0 mmol/L (ref 0.0–2.0)
Acid-Base Excess: 2 mmol/L (ref 0.0–2.0)
Bicarbonate: 25.6 mmol/L (ref 20.0–28.0)
Bicarbonate: 30.4 mmol/L — ABNORMAL HIGH (ref 20.0–28.0)
Calcium, Ion: 1.09 mmol/L — ABNORMAL LOW (ref 1.15–1.40)
Calcium, Ion: 1.15 mmol/L (ref 1.15–1.40)
HCT: 40 % (ref 36.0–46.0)
HCT: 42 % (ref 36.0–46.0)
Hemoglobin: 13.6 g/dL (ref 12.0–15.0)
Hemoglobin: 14.3 g/dL (ref 12.0–15.0)
O2 Saturation: 91 %
O2 Saturation: 94 %
Potassium: 4 mmol/L (ref 3.5–5.1)
Potassium: 4.9 mmol/L (ref 3.5–5.1)
Sodium: 135 mmol/L (ref 135–145)
Sodium: 137 mmol/L (ref 135–145)
TCO2: 27 mmol/L (ref 22–32)
TCO2: 32 mmol/L (ref 22–32)
pCO2, Ven: 45.2 mmHg (ref 44–60)
pCO2, Ven: 62.5 mmHg — ABNORMAL HIGH (ref 44–60)
pH, Ven: 7.294 (ref 7.25–7.43)
pH, Ven: 7.362 (ref 7.25–7.43)
pO2, Ven: 70 mmHg — ABNORMAL HIGH (ref 32–45)
pO2, Ven: 73 mmHg — ABNORMAL HIGH (ref 32–45)

## 2022-11-23 LAB — BRAIN NATRIURETIC PEPTIDE: B Natriuretic Peptide: 18 pg/mL (ref 0.0–100.0)

## 2022-11-23 LAB — GLUCOSE, CAPILLARY: Glucose-Capillary: 293 mg/dL — ABNORMAL HIGH (ref 70–99)

## 2022-11-23 LAB — I-STAT BETA HCG BLOOD, ED (MC, WL, AP ONLY): I-stat hCG, quantitative: <5 m[IU]/mL (ref <5)

## 2022-11-23 LAB — HEMOGLOBIN A1C
Hgb A1c MFr Bld: 8.7 % — ABNORMAL HIGH (ref 4.8–5.6)
Mean Plasma Glucose: 202.99 mg/dL

## 2022-11-23 MED ORDER — ENOXAPARIN SODIUM 60 MG/0.6ML IJ SOSY
60.0000 mg | PREFILLED_SYRINGE | INTRAMUSCULAR | Status: DC
  Administered 2022-11-23: 60 mg via SUBCUTANEOUS
  Filled 2022-11-23: qty 0.6

## 2022-11-23 MED ORDER — INSULIN ASPART 100 UNIT/ML IJ SOLN
5.0000 [IU] | Freq: Three times a day (TID) | INTRAMUSCULAR | Status: DC
  Administered 2022-11-23: 5 [IU] via SUBCUTANEOUS

## 2022-11-23 MED ORDER — INSULIN ASPART 100 UNIT/ML IJ SOLN
0.0000 [IU] | INTRAMUSCULAR | Status: DC
  Administered 2022-11-23: 3 [IU] via SUBCUTANEOUS
  Administered 2022-11-23 (×4): 5 [IU] via SUBCUTANEOUS
  Administered 2022-11-24: 7 [IU] via SUBCUTANEOUS
  Administered 2022-11-24: 9 [IU] via SUBCUTANEOUS
  Administered 2022-11-24: 7 [IU] via SUBCUTANEOUS
  Administered 2022-11-24: 5 [IU] via SUBCUTANEOUS
  Administered 2022-11-24: 9 [IU] via SUBCUTANEOUS
  Administered 2022-11-25: 7 [IU] via SUBCUTANEOUS
  Administered 2022-11-25: 9 [IU] via SUBCUTANEOUS
  Administered 2022-11-25: 2 [IU] via SUBCUTANEOUS
  Administered 2022-11-25 (×2): 5 [IU] via SUBCUTANEOUS
  Administered 2022-11-26: 3 [IU] via SUBCUTANEOUS
  Administered 2022-11-26: 2 [IU] via SUBCUTANEOUS
  Administered 2022-11-26 (×2): 3 [IU] via SUBCUTANEOUS
  Administered 2022-11-26 (×2): 5 [IU] via SUBCUTANEOUS
  Administered 2022-11-27 (×2): 2 [IU] via SUBCUTANEOUS

## 2022-11-23 MED ORDER — IPRATROPIUM-ALBUTEROL 0.5-2.5 (3) MG/3ML IN SOLN
3.0000 mL | RESPIRATORY_TRACT | Status: DC
  Administered 2022-11-23 – 2022-11-24 (×6): 3 mL via RESPIRATORY_TRACT
  Filled 2022-11-23 (×7): qty 3

## 2022-11-23 MED ORDER — FUROSEMIDE 10 MG/ML IJ SOLN
40.0000 mg | Freq: Once | INTRAMUSCULAR | Status: AC
  Administered 2022-11-23: 40 mg via INTRAVENOUS
  Filled 2022-11-23: qty 4

## 2022-11-23 MED ORDER — ROSUVASTATIN CALCIUM 20 MG PO TABS
20.0000 mg | ORAL_TABLET | Freq: Every day | ORAL | Status: DC
  Administered 2022-11-24 – 2022-11-27 (×4): 20 mg via ORAL
  Filled 2022-11-23 (×4): qty 1

## 2022-11-23 MED ORDER — PROCHLORPERAZINE EDISYLATE 10 MG/2ML IJ SOLN: 5.0000 mg | Freq: Four times a day (QID) | INTRAMUSCULAR | Status: DC | PRN

## 2022-11-23 MED ORDER — FUROSEMIDE 10 MG/ML IJ SOLN
40.0000 mg | Freq: Two times a day (BID) | INTRAMUSCULAR | Status: AC
  Administered 2022-11-23 – 2022-11-24 (×2): 40 mg via INTRAVENOUS
  Filled 2022-11-23 (×2): qty 4

## 2022-11-23 MED ORDER — MOMETASONE FURO-FORMOTEROL FUM 200-5 MCG/ACT IN AERO
2.0000 | INHALATION_SPRAY | Freq: Two times a day (BID) | RESPIRATORY_TRACT | Status: DC
  Administered 2022-11-24 – 2022-11-27 (×7): 2 via RESPIRATORY_TRACT
  Filled 2022-11-23: qty 8.8

## 2022-11-23 MED ORDER — INSULIN GLARGINE-YFGN 100 UNIT/ML ~~LOC~~ SOLN
20.0000 [IU] | Freq: Every day | SUBCUTANEOUS | Status: DC
  Administered 2022-11-23: 20 [IU] via SUBCUTANEOUS
  Filled 2022-11-23 (×2): qty 0.2

## 2022-11-23 MED ORDER — ACETAMINOPHEN 500 MG PO TABS
500.0000 mg | ORAL_TABLET | Freq: Four times a day (QID) | ORAL | Status: AC | PRN
  Administered 2022-11-23 – 2022-11-25 (×2): 500 mg via ORAL
  Filled 2022-11-23 (×2): qty 1

## 2022-11-23 MED ORDER — METHYLPREDNISOLONE SODIUM SUCC 40 MG IJ SOLR
40.0000 mg | Freq: Two times a day (BID) | INTRAMUSCULAR | Status: DC
  Administered 2022-11-23 – 2022-11-24 (×3): 40 mg via INTRAVENOUS
  Filled 2022-11-23 (×3): qty 1

## 2022-11-23 MED ORDER — POLYETHYLENE GLYCOL 3350 17 G PO PACK: 17.0000 g | PACK | Freq: Every day | ORAL | Status: DC | PRN

## 2022-11-23 NOTE — H&P (Signed)
History and Physical  [name] ZOX:[id number] DOB: [birth year] DOA: 11/22/2022  Referring physician: Berle Mull, PA-EDP PCP: Grayce Sessions, NP  Outpatient Specialists: Cardiology, pulmonary. Patient coming from: Home.  Chief Complaint: Shortness of breath with  HPI: Yvette Gibbs is a 32 y.o. female with medical history significant for severe morbid obesity, asthma, type 2 diabetes, hyperlipidemia, hypertension, OSA, OHS, who presented to [facility] ED from home via EMS due to progressive shortness of breath for the past 3 days.  Associated with pleuritic chest pain worse with coughing and audible wheezing.  She received albuterol nebs, Atrovent nebs IV Solu-Medrol 125 mg and 2 g magnesium en route via EMS.  In the ED, due to increased work of breathing and hypercarbia with respiratory acidosis she was placed on BiPAP.  Chest x-ray revealed the following findings:  1. Stable cardiomegaly with mild pulmonary vascular congestion. 2. Mild right infrahilar atelectasis.  The patient received additional nebulizer treatments with some improvement of her wheezing.  TR[facility], hospitalist service, was asked to admit for further management of acute asthma exacerbation.  ED Course: Temperature 98.2.  BP 137/99, pulse 101, respiratory 21, saturation 100% on BiPAP.  CBC essentially unremarkable.  CMP remarkable for elevated blood glucose 211.  Low albumin 3.2.  Review of Systems: Review of systems as noted in the HPI. All other systems reviewed and are negative.   Past Medical History:  Diagnosis Date   Acute exacerbation of CHF (congestive heart failure) (HCC) 05/04/2021   Asthma    Depression    Diabetes mellitus type 2, uncontrolled    Extreme obesity    Family history of adverse reaction to anesthesia    " my grandmother had an allergic reaction   Hypertension    Mental disorder    Obesity    OSA (obstructive sleep apnea)    Past Surgical History:  Procedure Laterality Date    APPENDECTOMY     CHOLECYSTECTOMY N/A 09/05/2014   Procedure: LAPAROSCOPIC CHOLECYSTECTOMY;  Surgeon: Axel Filler, MD;  Location: [facility] OR;  Service: General;  Laterality: N/A;   TONSILLECTOMY      Social History:  reports that she quit smoking about 4 years ago. Her smoking use included cigarettes. She has a 5.00 pack-year smoking history. She has never used smokeless tobacco. She reports that she does not currently use alcohol. She reports that she does not currently use drugs after having used the following drugs: Marijuana.   Allergies  Allergen Reactions   Bee Pollen Other (See Comments)    Sneezing and eye burning   Fish Allergy Itching and Rash   Lisinopril Rash    Caused a rash and caused her to sweat a lot.   Metformin And Related Itching and Rash    Family History  Problem Relation Age of Onset   Diabetes Mellitus II Maternal Grandmother       Prior to Admission medications   Medication Sig Start Date End Date Taking? Authorizing Provider  Accu-Chek Softclix Lancets lancets Use to check blood sugar TID. E11.9 09/03/22   Hoy Register, MD  acetaminophen (TYLENOL) 500 MG tablet Take 1,000 mg by mouth every 6 (six) hours as needed for mild pain, fever or headache.    [provider]  albuterol (PROVENTIL) (2.5 MG/3ML) 0.083% nebulizer solution Take 3 mLs (2.5 mg total) by nebulization every 4 (four) hours as needed for wheezing or shortness of breath (Do not use in addition to albuterol inhaler). 08/27/22   Narda Bonds, MD  albuterol (  VENTOLIN HFA) 108 (90 Base) MCG/ACT inhaler Inhale 2 puffs into the lungs every 6 (six) hours as needed for wheezing or shortness of breath (Do not use in addition to albuterol nebulizer). 08/27/22   Narda Bonds, MD  Blood Glucose Monitoring Suppl (ACCU-CHEK GUIDE) w/Device KIT Use to check blood sugar TID. E11.9 09/03/22   Hoy Register, MD  budesonide-formoterol (SYMBICORT) 160-4.5 MCG/ACT inhaler Inhale 2 puffs into the lungs  2 (two) times daily. 07/01/22   Grayce Sessions, NP  cephALEXin (KEFLEX) 500 MG capsule Take 1 capsule (500 mg total) by mouth 4 (four) times daily. 10/07/22   Mesner, Barbara Cower, MD  GLOBAL EASE INJECT PEN NEEDLES 32G X 4 MM MISC USE TO INJECT lantus EVERY DAY AS DIRECTED 07/03/22   Grayce Sessions, NP  glucose blood (ACCU-CHEK GUIDE) test strip Use to check blood sugar TID. E11.9 09/03/22   Hoy Register, MD  ibuprofen (ADVIL) 200 MG tablet Take 200 mg by mouth every 6 (six) hours as needed for fever, headache or mild pain.    [provider]  ibuprofen (ADVIL) 600 MG tablet Take 1 tablet (600 mg total) by mouth every 6 (six) hours as needed. 10/29/22   Roxy Horseman, PA-C  insulin glargine (LANTUS SOLOSTAR) 100 UNIT/ML Solostar Pen Inject 25 Units into the skin daily. 07/01/22   Grayce Sessions, NP  losartan (COZAAR) 25 MG tablet Take 1 tablet (25 mg total) by mouth daily. 08/28/22 11/26/22  Narda Bonds, MD  oxyCODONE-acetaminophen (PERCOCET) 5-325 MG tablet Take 1 tablet by mouth every 8 (eight) hours as needed. 10/07/22   Mesner, Barbara Cower, MD  propranolol (INDERAL) 10 MG tablet Take 1 tablet (10 mg total) by mouth 2 (two) times daily. 09/03/22   Hoy Register, MD  rosuvastatin (CRESTOR) 20 MG tablet Take 1 tablet (20 mg total) by mouth daily. 09/03/22   Hoy Register, MD  Semaglutide,0.25 or 0.5MG /DOS, (OZEMPIC, 0.25 OR 0.5 MG/DOSE,) 2 MG/3ML SOPN Inject 0.25 mg into the skin once a week. For 4 weeks. Then, increase to 0.5 mg into the skin once a week thereafter. 09/03/22   Hoy Register, MD    Physical Exam: BP (!) 146/77   Pulse (!) 102   Temp 98.1 F (36.7 C) (Oral)   Resp 19   Wt (!) 164.7 kg   SpO2 99%   BMI 66.39 kg/m   General: 32 y.o. year-old female well developed well nourished in no acute distress.  Somnolent but is easily arousable to voices.  On BiPAP. Cardiovascular: Regular rate and rhythm with no rubs or gallops.  No thyromegaly or JVD noted.  Trace  lower extremity edema bilaterally. Respiratory: Diffuse wheezing bilaterally.  Poor inspiratory effort. Abdomen: Soft nontender nondistended with normal bowel sounds x4 quadrants. Muskuloskeletal: No cyanosis or clubbing noted bilaterally Neuro: CN II-XII intact, strength, sensation, reflexes Skin: No ulcerative lesions noted or rashes Psychiatry: Mood is appropriate for condition and setting          Labs on Admission:  Basic Metabolic Panel: Recent Labs  Lab 11/22/22 2202 11/23/22 0019  NA 136 137  K 4.2 4.0  CL 98  --   CO2 30  --   GLUCOSE 211*  --   BUN 6  --   CREATININE 0.67  --   CALCIUM 8.9  --    Liver Function Tests: Recent Labs  Lab 11/22/22 2202  AST 29  ALT 24  ALKPHOS 68  BILITOT 0.5  PROT 7.1  ALBUMIN 3.2*  No results for input(s): "LIPASE", "AMYLASE" in the last 168 hours. No results for input(s): "AMMONIA" in the last 168 hours. CBC: Recent Labs  Lab 11/22/22 2202 11/23/22 0019  WBC 6.3  --   NEUTROABS 2.9  --   HGB 12.0 13.6  HCT 41.7 40.0  MCV 89.1  --   PLT 240  --    Cardiac Enzymes: No results for input(s): "CKTOTAL", "CKMB", "CKMBINDEX", "TROPONINI" in the last 168 hours.  BNP (last 3 results) Recent Labs    07/24/22 0007 08/25/22 0300 11/22/22 2202  BNP 10.1 13.6 18.0    ProBNP (last 3 results) No results for input(s): "PROBNP" in the last 8760 hours.  CBG: No results for input(s): "GLUCAP" in the last 168 hours.  Radiological Exams on Admission: DG Chest Portable 1 View  Result Date: 11/22/2022 CLINICAL DATA:  Shortness of breath. EXAM: PORTABLE CHEST 1 VIEW COMPARISON:  August 24, 2022 FINDINGS: The cardiac silhouette is mildly enlarged and unchanged in size. There is stable prominence of the perihilar pulmonary vasculature. Mild atelectasis is seen along the infrahilar region on the right. There is no evidence of focal consolidation, pleural effusion or pneumothorax. The visualized skeletal structures are unremarkable.  IMPRESSION: 1. Stable cardiomegaly with mild pulmonary vascular congestion. 2. Mild right infrahilar atelectasis. Electronically Signed   By: Aram Candela M.D.   On: 11/22/2022 22:27    EKG: I independently viewed the EKG done and my findings are as followed: Sinus tachycardia rate of 104.  Nonspecific ST-T changes.  QTc 498.  Assessment/Plan Present on Admission:  Acute asthma exacerbation  Principal Problem:   Acute asthma exacerbation  Acute asthma exacerbation, unknown trigger Obtain acute respiratory panel. Continue IV Solu-Medrol 40 mg twice daily Continue DuoNebs every 4 hours Resume home asthma regimen Wean off BiPAP as tolerated.  Mild pulmonary edema History of chronic HFpEF IV Lasix 40 mg x 1 Monitor strict I's and O's and daily weight  Acute on chronic hypoxic and hypercarbic respiratory failure secondary to above and underlying OSA/OHS Unclear if the patient has been compliant with her home BiPAP or home medications VBG with pH 7.294, pCO2 62.5 Continue BiPAP for now and then nightly Resume asthma regimen and CHF regimen when no longer n.p.o. Repeat VBG to assess progress while on BiPAP  Type 2 diabetes with hyperglycemia Last hemoglobin A1c 9.6 on 08/25/2022. Repeat A1c and start insulin sliding scale every 4 hours while NPO  Severe morbid obesity OSA OHS BMI 66 Recommend weight loss outpatient Recommend compliance with home NIV  Hypertension Resume home losartan when no longer n.p.o. The patient is on propranolol which could exacerbate her asthma, will hold off on it for now  Hyperlipidemia Resume home Crestor   DVT prophylaxis: Subcu Lovenox daily  Code Status: Full code  Family Communication: None at bedside.  Disposition Plan: Admitted to progressive care unit  Consults called: None.  Admission status: Inpatient status.   Status is: Inpatient The patient requires at least 2 midnights for further evaluation and treatment of present  condition.   Darlin Drop MD [facility] Pager [phone]  If 7PM-7AM, please contact night-coverage [email or web address] Password [email or web address]  11/23/2022, 1:49 AM

## 2022-11-23 NOTE — ED Notes (Signed)
ED TO INPATIENT HANDOFF REPORT  ED Nurse Name and Phone #: Rodney Booze [phone]  S Name/Age/Gender Yvette Gibbs 32 y.o. female Room/Bed: 025C/025C  Code Status   Code Status: Full Code  Home/SNF/Other Home Patient oriented to: self, place, time, and situation Is this baseline? Yes   Triage Complete: Triage complete  Chief Complaint Acute asthma exacerbation [J45.901]  Triage Note Pt BIB EMS from home. presents with SOB/CP x3 days. Pain worsens with movement, no relief from inhaler.  HX asthma. Has had bipap previously  EMS VS: 190/100, P: 98, 100% on neb, 94% room air EMS noted audible wheezing, 10mg  albuterol, 1mg  Atrovent, 125 solumedrol, 2g mag    Allergies Allergies  Allergen Reactions   Bee Pollen Other (See Comments)    Sneezing and eye burning   Fish Allergy Itching and Rash   Metformin And Related Itching and Rash   Shellfish Allergy Itching and Rash   Zestril [Lisinopril] Rash and Other (See Comments)    Diaphoresis     Level of Care/Admitting Diagnosis ED Disposition     ED Disposition  Admit   Condition  --   Comment  Hospital Area: [facility] [[id number]]  Level of Care: Progressive [102]  Admit to Progressive based on following criteria: RESPIRATORY PROBLEMS hypoxemic/hypercapnic respiratory failure that is responsive to NIPPV (BiPAP) or High Flow Nasal Cannula (6-80 lpm). Frequent assessment/intervention, no > Q2 hrs < Q4 hrs, to maintain oxygenation and pulmonary hygiene.  May admit patient to [facility] or [facility] if equivalent level of care is available:: No  Covid Evaluation: Asymptomatic - no recent exposure (last 10 days) testing not required  Diagnosis: Acute asthma exacerbation [829562]  Admitting Physician: Darlin Drop [[id number]]  Attending Physician: Darlin Drop [[id number]]  Certification:: I certify this patient will need inpatient services for at least 2 midnights  Estimated Length of Stay: 2           B Medical/Surgery History Past Medical History:  Diagnosis Date   Acute exacerbation of CHF (congestive heart failure) (HCC) 05/04/2021   Asthma    Depression    Diabetes mellitus type 2, uncontrolled    Extreme obesity    Family history of adverse reaction to anesthesia    " my grandmother had an allergic reaction   Hypertension    Mental disorder    Obesity    OSA (obstructive sleep apnea)    Past Surgical History:  Procedure Laterality Date   APPENDECTOMY     CHOLECYSTECTOMY N/A 09/05/2014   Procedure: LAPAROSCOPIC CHOLECYSTECTOMY;  Surgeon: Axel Filler, MD;  Location: [facility] OR;  Service: General;  Laterality: N/A;   TONSILLECTOMY       A IV Location/Drains/Wounds Patient Lines/Drains/Airways Status     Active Line/Drains/Airways     Name Placement date Placement time Site Days   Peripheral IV 11/22/22 20 G Left Antecubital 11/22/22  2245  Antecubital  1            Intake/Output Last 24 hours  Intake/Output Summary (Last 24 hours) at 11/23/2022 1659 Last data filed at 11/23/2022 5284 Gross per 24 hour  Intake --  Output 800 ml  Net -800 ml    Labs/Imaging Results for orders placed or performed during the hospital encounter of 11/22/22 (from the past 48 hour(s))  Comprehensive metabolic panel     Status: Abnormal   Collection Time: 11/22/22 10:02 PM  Result Value Ref Range   Sodium 136 135 - 145 mmol/L   Potassium  4.2 3.5 - 5.1 mmol/L   Chloride 98 98 - 111 mmol/L   CO2 30 22 - 32 mmol/L   Glucose, Bld 211 (H) 70 - 99 mg/dL    Comment: Glucose reference range applies only to samples taken after fasting for at least 8 hours.   BUN 6 6 - 20 mg/dL   Creatinine, Ser 1.61 0.44 - 1.00 mg/dL   Calcium 8.9 8.9 - 09.6 mg/dL   Total Protein 7.1 6.5 - 8.1 g/dL   Albumin 3.2 (L) 3.5 - 5.0 g/dL   AST 29 15 - 41 U/L   ALT 24 0 - 44 U/L   Alkaline Phosphatase 68 38 - 126 U/L   Total Bilirubin 0.5 0.3 - 1.2 mg/dL   GFR, Estimated >04 >54 mL/min    Comment:  (NOTE) Calculated using the CKD-EPI Creatinine Equation (2021)    Anion gap 8 5 - 15    Comment: Performed at [facility], [address], [place], [place] [address]  CBC with Differential     Status: Abnormal   Collection Time: 11/22/22 10:02 PM  Result Value Ref Range   WBC 6.3 4.0 - 10.5 K/uL   RBC 4.68 3.87 - 5.11 MIL/uL   Hemoglobin 12.0 12.0 - 15.0 g/dL   HCT 91.4 78.2 - 95.6 %   MCV 89.1 80.0 - 100.0 fL   MCH 25.6 (L) 26.0 - 34.0 pg   MCHC 28.8 (L) 30.0 - 36.0 g/dL   RDW 21.3 08.6 - 57.8 %   Platelets 240 150 - 400 K/uL   nRBC 0.0 0.0 - 0.2 %   Neutrophils Relative % 46 %   Neutro Abs 2.9 1.7 - 7.7 K/uL   Lymphocytes Relative 40 %   Lymphs Abs 2.5 0.7 - 4.0 K/uL   Monocytes Relative 11 %   Monocytes Absolute 0.7 0.1 - 1.0 K/uL   Eosinophils Relative 2 %   Eosinophils Absolute 0.1 0.0 - 0.5 K/uL   Basophils Relative 1 %   Basophils Absolute 0.0 0.0 - 0.1 K/uL   Immature Granulocytes 0 %   Abs Immature Granulocytes 0.02 0.00 - 0.07 K/uL    Comment: Performed at [facility], [address], [place], [place] [address]  Brain natriuretic peptide     Status: None   Collection Time: 11/22/22 10:02 PM  Result Value Ref Range   B Natriuretic Peptide 18.0 0.0 - 100.0 pg/mL    Comment: Performed at [facility], [address] [place], [place] [address]  Troponin I (High Sensitivity)     Status: None   Collection Time: 11/22/22 10:02 PM  Result Value Ref Range   Troponin I (High Sensitivity) 8 <18 ng/L    Comment: (NOTE) Elevated high sensitivity troponin I (hsTnI) values and significant  changes across serial measurements may suggest ACS but many other  chronic and acute conditions are known to elevate hsTnI results.  Refer to the "Links" section for chest pain algorithms and additional  guidance. Performed at [facility], [address], [place], [place] [address]   Troponin I (High Sensitivity)     Status: None   Collection Time: 11/23/22  12:10 AM  Result Value Ref Range   Troponin I (High Sensitivity) 7 <18 ng/L    Comment: (NOTE) Elevated high sensitivity troponin I (hsTnI) values and significant  changes across serial measurements may suggest ACS but many other  chronic and acute conditions are known to elevate hsTnI results.  Refer to the "Links" section for chest pain algorithms and additional  guidance. Performed at [facility], [address] [place], [place] [address]   I-Stat beta hCG blood, ED ([facility], [facility], [facility] only)     Status: None   Collection Time: 11/23/22 12:16 AM  Result Value Ref Range   I-stat hCG, quantitative <5.0 <5 mIU/mL   Comment 3            Comment:   GEST. AGE      CONC.  (mIU/mL)   <=1 WEEK        5 - 50     2 WEEKS       50 - 500     3 WEEKS       100 - 10,000     4 WEEKS     1,000 - 30,000        FEMALE AND NON-PREGNANT FEMALE:     LESS THAN 5 mIU/mL   I-Stat venous blood gas, ED     Status: Abnormal   Collection Time: 11/23/22 12:19 AM  Result Value Ref Range   pH, Ven 7.294 7.25 - 7.43   pCO2, Ven 62.5 (H) 44 - 60 mmHg   pO2, Ven 70 (H) 32 - 45 mmHg   Bicarbonate 30.4 (H) 20.0 - 28.0 mmol/L   TCO2 32 22 - 32 mmol/L   O2 Saturation 91 %   Acid-Base Excess 2.0 0.0 - 2.0 mmol/L   Sodium 137 135 - 145 mmol/L   Potassium 4.0 3.5 - 5.1 mmol/L   Calcium, Ion 1.15 1.15 - 1.40 mmol/L   HCT 40.0 36.0 - 46.0 %   Hemoglobin 13.6 12.0 - 15.0 g/dL   Sample type VENOUS   I-Stat venous blood gas, ED     Status: Abnormal   Collection Time: 11/23/22  6:51 AM  Result Value Ref Range   pH, Ven 7.362 7.25 - 7.43   pCO2, Ven 45.2 44 - 60 mmHg   pO2, Ven 73 (H) 32 - 45 mmHg   Bicarbonate 25.6 20.0 - 28.0 mmol/L   TCO2 27 22 - 32 mmol/L   O2 Saturation 94 %   Acid-Base Excess 0.0 0.0 - 2.0 mmol/L   Sodium 135 135 - 145 mmol/L   Potassium 4.9 3.5 - 5.1 mmol/L   Calcium, Ion 1.09 (L) 1.15 - 1.40 mmol/L   HCT 42.0 36.0 - 46.0 %   Hemoglobin 14.3 12.0 - 15.0 g/dL   Sample type VENOUS    CBG monitoring, ED     Status: Abnormal   Collection Time: 11/23/22  8:58 AM  Result Value Ref Range   Glucose-Capillary 259 (H) 70 - 99 mg/dL    Comment: Glucose reference range applies only to samples taken after fasting for at least 8 hours.  CBG monitoring, ED     Status: Abnormal   Collection Time: 11/23/22 12:15 PM  Result Value Ref Range   Glucose-Capillary 274 (H) 70 - 99 mg/dL    Comment: Glucose reference range applies only to samples taken after fasting for at least 8 hours.  CBG monitoring, ED     Status: Abnormal   Collection Time: 11/23/22  3:40 PM  Result Value Ref Range   Glucose-Capillary 273 (H) 70 - 99 mg/dL    Comment: Glucose reference range applies only to samples taken after fasting for at least 8 hours.   DG Chest Portable 1 View  Result Date: 11/22/2022 CLINICAL DATA:  Shortness of breath.  EXAM: PORTABLE CHEST 1 VIEW COMPARISON:  August 24, 2022 FINDINGS: The cardiac silhouette is mildly enlarged and unchanged in size. There is stable prominence of the perihilar pulmonary vasculature. Mild atelectasis is seen along the infrahilar region on the right. There is no evidence of focal consolidation, pleural effusion or pneumothorax. The visualized skeletal structures are unremarkable. IMPRESSION: 1. Stable cardiomegaly with mild pulmonary vascular congestion. 2. Mild right infrahilar atelectasis. Electronically Signed   By: Aram Candela M.D.   On: 11/22/2022 22:27    Pending Labs Unresulted Labs (From admission, onward)     Start     Ordered   11/30/22 0500  Creatinine, serum  (enoxaparin (LOVENOX)    CrCl >/= 30 ml/min)  Weekly,   R     Comments: while on enoxaparin therapy    11/23/22 0148   11/24/22 0500  Basic metabolic panel  Tomorrow morning,   R        11/23/22 0622   11/24/22 0500  Magnesium  Tomorrow morning,   R        11/23/22 0622   11/24/22 0500  CBC  Tomorrow morning,   R        11/23/22 1234   11/23/22 1204  Hemoglobin A1c  Add-on,   AD         11/23/22 1203   11/23/22 0614  Blood gas, venous  Once,   R        11/23/22 0614            Vitals/Pain Today's Vitals   11/23/22 1530 11/23/22 1531 11/23/22 1535 11/23/22 1545  BP: (!) 106/95   131/74  Pulse: (!) 103   96  Resp: 18   19  Temp:      TempSrc:      SpO2: 100%   99%  Weight:   (!) 164.7 kg   Height:   5\' 2"  (1.575 m)   PainSc:  10-Worst pain ever      Isolation Precautions No active isolations  Medications Medications  albuterol (PROVENTIL) (2.5 MG/3ML) 0.083% nebulizer solution ( Nebulization Canceled Entry 11/22/22 2226)  enoxaparin (LOVENOX) injection 60 mg (60 mg Subcutaneous Given 11/23/22 1604)  methylPREDNISolone sodium succinate (SOLU-MEDROL) 40 mg/mL injection 40 mg (40 mg Intravenous Given 11/23/22 0641)  ipratropium-albuterol (DUONEB) 0.5-2.5 (3) MG/3ML nebulizer solution 3 mL (3 mLs Nebulization Given 11/23/22 1622)  mometasone-formoterol (DULERA) 200-5 MCG/ACT inhaler 2 puff (2 puffs Inhalation Not Given 11/23/22 0830)  rosuvastatin (CRESTOR) tablet 20 mg (20 mg Oral Not Given 11/23/22 0907)  acetaminophen (TYLENOL) tablet 500 mg (500 mg Oral Given 11/23/22 1345)  prochlorperazine (COMPAZINE) injection 5 mg (has no administration in time range)  polyethylene glycol (MIRALAX / GLYCOLAX) packet 17 g (has no administration in time range)  insulin aspart (novoLOG) injection 0-9 Units (3 Units Subcutaneous Given 11/23/22 1606)  insulin aspart (novoLOG) injection 5 Units (has no administration in time range)  insulin glargine-yfgn (SEMGLEE) injection 20 Units (20 Units Subcutaneous Given 11/23/22 1541)  furosemide (LASIX) injection 40 mg (has no administration in time range)  ipratropium (ATROVENT) nebulizer solution 0.5 mg (0.5 mg Nebulization Given 11/22/22 2225)  albuterol (PROVENTIL) (2.5 MG/3ML) 0.083% nebulizer solution (10 mg  Given 11/22/22 2225)  prochlorperazine (COMPAZINE) injection 5 mg (5 mg Intravenous Given 11/22/22 2313)  LORazepam (ATIVAN)  injection 0.5 mg (0.5 mg Intravenous Given 11/22/22 2339)  furosemide (LASIX) injection 40 mg (40 mg Intravenous Given 11/23/22 0641)    Mobility walks with person assist  Focused Assessments Cardiac Assessment Handoff:    Lab Results  Component Value Date   TROPONINI <0.03 09/02/2015   Lab Results  Component Value Date   DDIMER <0.27 07/24/2022   Does the Patient currently have chest pain? No   , Pulmonary Assessment Handoff:  Lung sounds: Bilateral Breath Sounds: Diminished, Clear L Breath Sounds: Expiratory wheezes, Inspiratory wheezes R Breath Sounds: Expiratory wheezes O2 Device: Nasal Cannula O2 Flow Rate (L/min): 2 L/min    R Recommendations: See Admitting Provider Note  Report given to:   Additional Notes:

## 2022-11-23 NOTE — Progress Notes (Addendum)
Patient seen and examined, admitted earlier this morning by Dr. Margo Aye please see her H&P for details briefly -Yvette Gibbs is a 32/F with severe morbid obesity, BMI of 68, type 2 diabetes mellitus, asthma, OSA/OHS, hypertension dyslipidemia presented to the ED with progressive dyspnea on exertion cough and wheezing. -In the ER she was hypoxic, placed on supplemental O2, hypercarbic with respiratory acidosis on ABG, placed on BiPAP, chest x-ray noted cardiomegaly and mild pulmonary vascular congestion.  Acute on chronic hypoxic and hypercarbic respiratory failure -Primarily suspect asthma exacerbation at this time, may be a component of diastolic CHF 2, volume status is very difficult to assess -Continue IV Solu-Medrol, DuoNebs  -Weaned off BiPAP  -continue IV Lasix today  Acute on chronic diastolic CHF, mild pulmonary edema -IV Lasix today,  -Echo 3/24 with preserved EF,moderate LVH, RV normal respiratory related ventricular septal shift  OSA OHS -Apparently does not have a functional BiPAP at home now -Will consult TOC, -Resume BiPAP nightly  Type 2 diabetes mellitus -CBGs elevated, add Semglee and meal coverage insulin  Morbid obesity -Needs diet and lifestyle modification, may be a good candidate for GLP-1 agonists  Zannie Cove, MD

## 2022-11-23 NOTE — Progress Notes (Signed)
Pt taken off BiPAP and placed on 2L [place]. Vitals stable at this time. RT will continue to monitor pt.

## 2022-11-23 NOTE — Progress Notes (Signed)
PT Cancellation Note  Patient Details Name: Yvette Gibbs MRN: [id number] DOB: 06-02-1991   Cancelled Treatment:    Reason Eval/Treat Not Completed: Other (comment) Pt declined due to headache.  Will f/u as able.  Anise Salvo, PT Acute Rehab [facility] [phone]   [name] 11/23/2022, 12:23 PM

## 2022-11-23 NOTE — Evaluation (Signed)
Physical Therapy Treatment Patient Details Name: Yvette Gibbs MRN: [id number] DOB: Apr 02, 1991 Today's Date: 11/23/2022   History of Present Illness Pt is 32 yo female admitted on 11/22/22 with acute on chronic hypoxic and hypercarbic resp failure - suspected asthma exacerbation and may be a component of diastolic CHF.  Pt with hx including morbid obesity, DM2, asthma, OSA, HTN, dyslipidemia    PT Comments    Pt admitted with above diagnosis. At baseline, pt lives alone and is independent.  Today, pt was able to ambulate 30' with min guard and no AD but further ambulation limited due to fatigue and lightheaded.  Pt had ambulated to restroom, had BM and some emesis, was then lightheaded and transported back to room in chair.  All VSS. Pt expected to progress and likely no PT needs at d/c.  Will follow acutely.   Pt currently with functional limitations due to the deficits listed below (see PT Problem List). Pt will benefit from acute skilled PT to increase their independence and safety with mobility to allow discharge.      Recommendations for follow up therapy are one component of a multi-disciplinary discharge planning process, led by the attending physician.  Recommendations may be updated based on patient status, additional functional criteria and insurance authorization.  Follow Up Recommendations       Assistance Recommended at Discharge PRN  Patient can return home with the following Assistance with cooking/housework;Help with stairs or ramp for entrance   Equipment Recommendations  None recommended by PT    Recommendations for Other Services       Precautions / Restrictions Precautions Precautions: None     Mobility  Bed Mobility Overal bed mobility: Needs Assistance Bed Mobility: Supine to Sit, Sit to Supine     Supine to sit: Min guard, HOB elevated Sit to supine: Min guard, HOB elevated        Transfers Overall transfer level: Needs assistance Equipment used:  None Transfers: Sit to/from Stand Sit to Stand: Min guard           General transfer comment: Performed from stretcher, toilet , and transport chair with min guard for safety.  With return to sitting on bed , pt with significant lateral lean so that she could bring leg up onto bed and transition straight to supine    Ambulation/Gait Ambulation/Gait assistance: Min guard Gait Distance (Feet): 35 Feet Assistive device: None Gait Pattern/deviations: Wide base of support Gait velocity: decreased     General Gait Details: Wide BOS with increased lateral sway but no LOB; slower speed and fatigued easily; Ambulated to bathroom but then with some emesis and lightheadedness so transport chair back to bed   Stairs             Wheelchair Mobility    Modified Rankin (Stroke Patients Only)       Balance Overall balance assessment: Needs assistance Sitting-balance support: No upper extremity supported Sitting balance-Leahy Scale: Good     Standing balance support: No upper extremity supported Standing balance-Leahy Scale: Fair                              Cognition Arousal/Alertness: Awake/alert Behavior During Therapy: WFL for tasks assessed/performed Overall Cognitive Status: Within Functional Limits for tasks assessed  Exercises      General Comments General comments (skin integrity, edema, etc.): Pt on 3 L O2 with sats 97%. Tried RA and maintained 94% rest and walking.  Walked to bathroom.  Pt had episode of emesis - small amount that appeared like blood tinged sputum (notified nursing).  She had some c/o lightheadedness - O2 sats 96%.  Returned to room in transport chair.  BP was 142/67 with return to EOB. Placed back on 3 L O2      Pertinent Vitals/Pain Pain Assessment Pain Assessment: 0-10 Pain Score: 8  Pain Location: low back Pain Descriptors / Indicators: Discomfort Pain  Intervention(s): Limited activity within patient's tolerance, Monitored during session, Repositioned (on hospital stretcher, some improvemetn with transfers)    Home Living Family/patient expects to be discharged to:: Private residence Living Arrangements: Alone Available Help at Discharge: Family;Available PRN/intermittently Type of Home: Apartment Home Access: Level entry       Home Layout: One level Home Equipment: None      Prior Function            PT Goals (current goals can now be found in the care plan section) Acute Rehab PT Goals Patient Stated Goal: return home PT Goal Formulation: With patient Time For Goal Achievement: 12/07/22 Potential to Achieve Goals: Good    Frequency    Min 3X/week      PT Plan      Co-evaluation              AM-PAC PT "6 Clicks" Mobility   Outcome Measure  Help needed turning from your back to your side while in a flat bed without using bedrails?: A Little Help needed moving from lying on your back to sitting on the side of a flat bed without using bedrails?: A Little Help needed moving to and from a bed to a chair (including a wheelchair)?: A Little Help needed standing up from a chair using your arms (e.g., wheelchair or bedside chair)?: A Little Help needed to walk in hospital room?: A Little Help needed climbing 3-5 steps with a railing? : A Little 6 Click Score: 18    End of Session   Activity Tolerance: Patient limited by fatigue Patient left: in bed;with call bell/phone within reach Nurse Communication: Mobility status;Other (comment) (blood tinged emesis) PT Visit Diagnosis: Other abnormalities of gait and mobility (R26.89)     Time: 0981-1914 PT Time Calculation (min) (ACUTE ONLY): 32 min  Charges:  $Gait Training: 8-22 mins                     Anise Salvo, PT Acute Rehab [facility] [phone]    [name] 11/23/2022, 3:32 PM

## 2022-11-24 DIAGNOSIS — J45901 Unspecified asthma with (acute) exacerbation: Secondary | ICD-10-CM | POA: Diagnosis not present

## 2022-11-24 LAB — GLUCOSE, CAPILLARY
Glucose-Capillary: 282 mg/dL — ABNORMAL HIGH (ref 70–99)
Glucose-Capillary: 291 mg/dL — ABNORMAL HIGH (ref 70–99)
Glucose-Capillary: 304 mg/dL — ABNORMAL HIGH (ref 70–99)
Glucose-Capillary: 327 mg/dL — ABNORMAL HIGH (ref 70–99)
Glucose-Capillary: 362 mg/dL — ABNORMAL HIGH (ref 70–99)
Glucose-Capillary: 424 mg/dL — ABNORMAL HIGH (ref 70–99)

## 2022-11-24 LAB — CBC
HCT: 40.9 % (ref 36.0–46.0)
Hemoglobin: 12.2 g/dL (ref 12.0–15.0)
MCH: 26.5 pg (ref 26.0–34.0)
MCHC: 29.8 g/dL — ABNORMAL LOW (ref 30.0–36.0)
MCV: 88.9 fL (ref 80.0–100.0)
Platelets: 256 10*3/uL (ref 150–400)
RBC: 4.6 MIL/uL (ref 3.87–5.11)
RDW: 15.1 % (ref 11.5–15.5)
WBC: 6 10*3/uL (ref 4.0–10.5)
nRBC: 0 % (ref 0.0–0.2)

## 2022-11-24 LAB — BASIC METABOLIC PANEL
Anion gap: 8 (ref 5–15)
BUN: 15 mg/dL (ref 6–20)
CO2: 29 mmol/L (ref 22–32)
Calcium: 9.3 mg/dL (ref 8.9–10.3)
Chloride: 96 mmol/L — ABNORMAL LOW (ref 98–111)
Creatinine, Ser: 0.8 mg/dL (ref 0.44–1.00)
GFR, Estimated: >60 mL/min (ref >60)
Glucose, Bld: 304 mg/dL — ABNORMAL HIGH (ref 70–99)
Potassium: 4.5 mmol/L (ref 3.5–5.1)
Sodium: 133 mmol/L — ABNORMAL LOW (ref 135–145)

## 2022-11-24 LAB — MAGNESIUM: Magnesium: 1.8 mg/dL (ref 1.7–2.4)

## 2022-11-24 MED ORDER — IPRATROPIUM-ALBUTEROL 0.5-2.5 (3) MG/3ML IN SOLN
3.0000 mL | Freq: Three times a day (TID) | RESPIRATORY_TRACT | Status: DC
  Administered 2022-11-24 – 2022-11-26 (×6): 3 mL via RESPIRATORY_TRACT
  Filled 2022-11-24 (×6): qty 3

## 2022-11-24 MED ORDER — INSULIN ASPART 100 UNIT/ML IJ SOLN
6.0000 [IU] | Freq: Three times a day (TID) | INTRAMUSCULAR | Status: DC
  Administered 2022-11-24 (×2): 6 [IU] via SUBCUTANEOUS

## 2022-11-24 MED ORDER — METHYLPREDNISOLONE SODIUM SUCC 40 MG IJ SOLR
40.0000 mg | INTRAMUSCULAR | Status: DC
  Administered 2022-11-25 – 2022-11-27 (×3): 40 mg via INTRAVENOUS
  Filled 2022-11-24 (×3): qty 1

## 2022-11-24 MED ORDER — INSULIN GLARGINE-YFGN 100 UNIT/ML ~~LOC~~ SOLN
30.0000 [IU] | Freq: Every day | SUBCUTANEOUS | Status: DC
  Administered 2022-11-24: 30 [IU] via SUBCUTANEOUS
  Filled 2022-11-24 (×2): qty 0.3

## 2022-11-24 MED ORDER — FUROSEMIDE 10 MG/ML IJ SOLN
40.0000 mg | Freq: Four times a day (QID) | INTRAMUSCULAR | Status: AC
  Administered 2022-11-24 (×2): 40 mg via INTRAVENOUS
  Filled 2022-11-24 (×2): qty 4

## 2022-11-24 MED ORDER — ENOXAPARIN SODIUM 80 MG/0.8ML IJ SOSY
80.0000 mg | PREFILLED_SYRINGE | INTRAMUSCULAR | Status: DC
  Administered 2022-11-24 – 2022-11-26 (×3): 80 mg via SUBCUTANEOUS
  Filled 2022-11-24 (×2): qty 0.8

## 2022-11-24 MED ORDER — INSULIN GLARGINE-YFGN 100 UNIT/ML ~~LOC~~ SOLN
10.0000 [IU] | Freq: Once | SUBCUTANEOUS | Status: AC
  Administered 2022-11-24: 10 [IU] via SUBCUTANEOUS
  Filled 2022-11-24: qty 0.1

## 2022-11-24 MED ORDER — INSULIN ASPART 100 UNIT/ML IJ SOLN
8.0000 [IU] | Freq: Three times a day (TID) | INTRAMUSCULAR | Status: DC
  Administered 2022-11-24 – 2022-11-27 (×8): 8 [IU] via SUBCUTANEOUS

## 2022-11-24 NOTE — TOC Initial Note (Signed)
Transition of Care (TOC) - Initial/Assessment Note  Donn Pierini RN, BSN Transitions of Care Unit 4E- RN Case Manager See Treatment Team for direct phone #   Patient Details  Name: Yvette Gibbs MRN: [id number] Date of Birth: [birth year]  Transition of Care [facility]) CM/SW Contact:    Darrold Span, RN Phone Number: 11/24/2022, 4:13 PM  Clinical Narrative:                 Referral received for home Bipap- pt is familiar to this CM from Mar. Admit. When home bipap needed at that time as well and pt was to f/u with Pulmonology as outpt  CM spoke with pt at bedside to discuss where she is at with Pulm. Folllow up.   Per pt she states she was to see Pulm. In May however appointment was changed to June 6- pt then states that appointment she missed due to a mix up with transportation - so she needs to re-schedule the appointment again and has not done so yet. Pt acknowledges that she may have to do more testing or a sleep study in order to qualify for a new Bipap.   Pt voiced that she wants a Bipap and not a NIV machine- which is also what she stated in March- (she previously had a NIV and turned it back in to [facility] because she did not like it). Pt confirmed that she does not have any time of machine at this time or home 02.   Per chart review pt was to get a nebulizer which her PCP ordered- however pt voiced she has not received that yet from [facility]-  CM placed call to liaison and confirmed that they have attempted to call pt but have not been able to reach her- requested that nebulizer be delivered to pt here at hospital- [facility] has confirmed that they can use order from PCP to deliver to hospital and will plan to deliver today.   MD updated that pt will need to f/u with Pulmonology outpt for Bipap needs   Expected Discharge Plan: Home/Self Care Barriers to Discharge: Continued Medical Work up   Patient Goals and CMS Choice Patient states their goals for this hospitalization and  ongoing recovery are:: return home CMS Medicare.gov Compare Post Acute Care list provided to:: Patient Choice offered to / list presented to : NA      Expected Discharge Plan and Services   Discharge Planning Services: CM Consult   Living arrangements for the past 2 months: Single Family Home                 DME Arranged: Nebulizer machine DME Agency: [employer] Date DME Agency Contacted: 11/24/22 Time DME Agency Contacted: 1300 Representative spoke with at DME Agency: Mitch            Prior Living Arrangements/Services Living arrangements for the past 2 months: Single Family Home Lives with:: Self, Adult Children, Siblings Patient language and need for interpreter reviewed:: Yes Do you feel safe going back to the place where you live?: Yes      Need for Family Participation in Patient Care: Yes (Comment) Care giver support system in place?: Yes (comment)   Criminal Activity/Legal Involvement Pertinent to Current Situation/Hospitalization: No - Comment as needed  Activities of Daily Living      Permission Sought/Granted Permission sought to share information with : Facility Industrial/product designer granted to share information with : Yes, Verbal Permission Granted     Permission granted  to share info w AGENCY: [facility]        Emotional Assessment Appearance:: Appears stated age Attitude/Demeanor/Rapport: Engaged Affect (typically observed): Accepting, Appropriate Orientation: : Oriented to Self, Oriented to Place, Oriented to  Time, Oriented to Situation Alcohol / Substance Use: Not Applicable Psych Involvement: No (comment)  Admission diagnosis:  Acute asthma exacerbation [J45.901] Severe asthma with exacerbation, unspecified whether persistent [J45.901] Patient Active Problem List   Diagnosis Date Noted   Shortness of breath 08/25/2022   Hypoxia    Acute exacerbation of CHF (congestive heart failure) (HCC) 05/04/2021   Acute asthma exacerbation  12/17/2020   Obstructive lung disease (HCC) 12/17/2020   Acute on chronic respiratory failure with hypoxia and hypercapnia (HCC) 12/17/2020   Heart failure with preserved ejection fraction (HCC) 10/01/2020   Obesity hypoventilation syndrome (HCC) 09/29/2020   Acute respiratory failure with hypoxia (HCC) 09/29/2020   Normocytic anemia 09/29/2020   Mild concentric left ventricular hypertrophy (LVH) 09/29/2020   NAFLD (nonalcoholic fatty liver disease) 16/03/9603   Asthma exacerbation 09/28/2020   Acute respiratory failure due to COVID-19 (HCC) 05/21/2020   Gastroesophageal reflux disease without esophagitis 06/19/2019   Loud snoring 06/19/2019   Diabetes mellitus type 2, insulin dependent (HCC) 03/04/2019   HTN (hypertension) 03/03/2019   Hyperglycemia 03/03/2019   Proteinuria 03/03/2019   HCAP (healthcare-associated pneumonia) 09/07/2015   Asthma 09/07/2015   Pleuritic chest pain 09/01/2015   Morbid obesity (HCC) 09/01/2015   OSA (obstructive sleep apnea) 09/01/2015   Influenza 09/01/2015   Biliary colic 09/04/2014   PCP:  Grayce Sessions, NP Pharmacy:   [facility] - [place], [place] - [address] [place] [address] Phone: [phone] Fax: [phone]     Social Determinants of Health (SDOH) Social History: SDOH Screenings   Food Insecurity: Food Insecurity Present (08/25/2022)  Housing: Low Risk  (08/25/2022)  Transportation Needs: Unmet Transportation Needs (08/25/2022)  Utilities: Not At Risk (08/25/2022)  Depression (PHQ2-9): High Risk (07/01/2022)  Financial Resource Strain: Low Risk  (05/05/2021)  Tobacco Use: Medium Risk (11/23/2022)   SDOH Interventions:     Readmission Risk Interventions    12/21/2020   11:54 AM  Readmission Risk Prevention Plan  Transportation Screening Complete  Medication Review (RN Care Manager) Complete  HRI or Home Care Consult Complete  SW Recovery Care/Counseling Consult Complete   Palliative Care Screening Not Applicable  Skilled Nursing Facility Not Applicable

## 2022-11-24 NOTE — Progress Notes (Signed)
   11/23/22 2309  BiPAP/CPAP/SIPAP  $ Non-Invasive Ventilator  Non-Invasive Vent Set Up  $ Face Mask Medium Yes  BiPAP/CPAP/SIPAP Pt Type Adult  BiPAP/CPAP/SIPAP V60  Mask Type Full face mask  Mask Size Medium  IPAP 16 cmH20  EPAP 8 cmH2O  Pressure Support 8 cmH20  FiO2 (%) 28 %  Leak 16  Auto Titrate No  CPAP/SIPAP surface wiped down Yes  BiPAP/CPAP /SiPAP Vitals  Bilateral Breath Sounds Diminished;Clear

## 2022-11-24 NOTE — Progress Notes (Signed)
PROGRESS NOTE    Yvette Gibbs  BJY:[id number] DOB: 09-13-1990 DOA: 11/22/2022 PCP: Grayce Sessions, NP  Ms. Boulay is a 32/F with severe morbid obesity, BMI of 68, type 2 diabetes mellitus, asthma, OSA/OHS, hypertension dyslipidemia presented to the ED with progressive dyspnea on exertion cough and wheezing. -In the ER she was hypoxic, placed on supplemental O2, hypercarbic with respiratory acidosis on ABG, placed on BiPAP, chest x-ray noted cardiomegaly and mild pulmonary vascular congestion.   Subjective: Feels a little better, breathing is improving  Assessment and Plan:  Acute on chronic hypoxic and hypercarbic respiratory failure -Primarily suspect asthma exacerbation at this time, may be a component of diastolic CHF 2, volume status is very difficult to assess -Cut down Solu-Medrol, continue DuoNebs  -Continue BiPAP nightly -continue IV Lasix today -Increase activity, PT following   Acute on chronic diastolic CHF, mild pulmonary edema -IV Lasix today,  -Echo 3/24 with preserved EF,moderate LVH, RV normal respiratory related ventricular septal shift -Aldactone tomorrow, avoid SGLT2i with skin obesity and uncontrolled diabetes   OSA OHS -Apparently does not have a functional BiPAP at home now -[facility] consult altered -Resume BiPAP nightly   Type 2 diabetes mellitus -Uncontrolled, increase Semglee, increase meal coverage -A1c is 8.7, CBGs worse in the setting of steroids   Morbid obesity -Needs diet and lifestyle modification, may be a good candidate for GLP-1 agonists      DVT prophylaxis: Lovenox Code Status: Full code Family Communication: Disposition Plan: Home likely 1 to 2 days  Consultants:    Procedures:   Antimicrobials:    Objective: Vitals:   11/24/22 0454 11/24/22 0700 11/24/22 0749 11/24/22 1220  BP: (!) 138/93  (!) 142/95 (!) 159/97  Pulse: 83 83 92 97  Resp: 16  20 (!) 21  Temp: 97.6 F (36.4 C)  97.8 F (36.6 C) 98.3 F (36.8 C)   TempSrc: Oral  Oral Oral  SpO2: 94%  93% 98%  Weight:      Height:        Intake/Output Summary (Last 24 hours) at 11/24/2022 1246 Last data filed at 11/24/2022 1222 Gross per 24 hour  Intake 240 ml  Output 2350 ml  Net -2110 ml   Filed Weights   11/22/22 2208 11/23/22 1535  Weight: (!) 164.7 kg (!) 164.7 kg    Examination:  General exam: Morbidly obese chronically ill female sitting up in bed, eating breakfast, AAOx3 Neck obese unable to assess JVD Lungs: Poor air movement, scattered expiratory wheezes Abdomen: Soft, obese, nontender, bowel sounds present Extremities: Trace edema Skin: No rashes Psychiatry:  Mood & affect appropriate.     Data Reviewed:   CBC: Recent Labs  Lab 11/22/22 2202 11/23/22 0019 11/23/22 0651 11/24/22 0123  WBC 6.3  --   --  6.0  NEUTROABS 2.9  --   --   --   HGB 12.0 13.6 14.3 12.2  HCT 41.7 40.0 42.0 40.9  MCV 89.1  --   --  88.9  PLT 240  --   --  256   Basic Metabolic Panel: Recent Labs  Lab 11/22/22 2202 11/23/22 0019 11/23/22 0651 11/24/22 0123  NA 136 137 135 133*  K 4.2 4.0 4.9 4.5  CL 98  --   --  96*  CO2 30  --   --  29  GLUCOSE 211*  --   --  304*  BUN 6  --   --  15  CREATININE 0.67  --   --  0.80  CALCIUM 8.9  --   --  9.3  MG  --   --   --  1.8   GFR: Estimated Creatinine Clearance: 155.7 mL/min (by C-G formula based on SCr of 0.8 mg/dL). Liver Function Tests: Recent Labs  Lab 11/22/22 2202  AST 29  ALT 24  ALKPHOS 68  BILITOT 0.5  PROT 7.1  ALBUMIN 3.2*   No results for input(s): "LIPASE", "AMYLASE" in the last 168 hours. No results for input(s): "AMMONIA" in the last 168 hours. Coagulation Profile: No results for input(s): "INR", "PROTIME" in the last 168 hours. Cardiac Enzymes: No results for input(s): "CKTOTAL", "CKMB", "CKMBINDEX", "TROPONINI" in the last 168 hours. BNP (last 3 results) No results for input(s): "PROBNP" in the last 8760 hours. HbA1C: Recent Labs    11/23/22 1846   HGBA1C 8.7*   CBG: Recent Labs  Lab 11/23/22 2001 11/24/22 0021 11/24/22 0456 11/24/22 0802 11/24/22 1218  GLUCAP 293* 291* 282* 304* 424*   Lipid Profile: No results for input(s): "CHOL", "HDL", "LDLCALC", "TRIG", "CHOLHDL", "LDLDIRECT" in the last 72 hours. Thyroid Function Tests: No results for input(s): "TSH", "T4TOTAL", "FREET4", "T3FREE", "THYROIDAB" in the last 72 hours. Anemia Panel: No results for input(s): "VITAMINB12", "FOLATE", "FERRITIN", "TIBC", "IRON", "RETICCTPCT" in the last 72 hours. Urine analysis:    Component Value Date/Time   COLORURINE YELLOW 06/09/2022 2155   APPEARANCEUR CLOUDY (A) 06/09/2022 2155   LABSPEC 1.019 06/09/2022 2155   PHURINE 7.0 06/09/2022 2155   GLUCOSEU NEGATIVE 06/09/2022 2155   HGBUR LARGE (A) 06/09/2022 2155   BILIRUBINUR NEGATIVE 06/09/2022 2155   KETONESUR NEGATIVE 06/09/2022 2155   PROTEINUR >=300 (A) 06/09/2022 2155   UROBILINOGEN 0.2 09/04/2014 1001   NITRITE NEGATIVE 06/09/2022 2155   LEUKOCYTESUR NEGATIVE 06/09/2022 2155   Sepsis Labs: @LABRCNTIP (procalcitonin:4,lacticidven:4)  )No results found for this or any previous visit (from the past 240 hour(s)).   Radiology Studies: DG Chest Portable 1 View  Result Date: 11/22/2022 CLINICAL DATA:  Shortness of breath. EXAM: PORTABLE CHEST 1 VIEW COMPARISON:  August 24, 2022 FINDINGS: The cardiac silhouette is mildly enlarged and unchanged in size. There is stable prominence of the perihilar pulmonary vasculature. Mild atelectasis is seen along the infrahilar region on the right. There is no evidence of focal consolidation, pleural effusion or pneumothorax. The visualized skeletal structures are unremarkable. IMPRESSION: 1. Stable cardiomegaly with mild pulmonary vascular congestion. 2. Mild right infrahilar atelectasis. Electronically Signed   By: Aram Candela M.D.   On: 11/22/2022 22:27     Scheduled Meds:  enoxaparin (LOVENOX) injection  80 mg Subcutaneous Q24H   insulin  aspart  0-9 Units Subcutaneous Q4H   insulin aspart  8 Units Subcutaneous TID WC   insulin glargine-yfgn  10 Units Subcutaneous Once   insulin glargine-yfgn  30 Units Subcutaneous Daily   ipratropium-albuterol  3 mL Nebulization TID   [START ON 11/25/2022] methylPREDNISolone (SOLU-MEDROL) injection  40 mg Intravenous Q24H   mometasone-formoterol  2 puff Inhalation BID   rosuvastatin  20 mg Oral Daily   Continuous Infusions:   LOS: 1 day    Time spent:33min    Zannie Cove, MD [facility]   11/24/2022, 12:46 PM

## 2022-11-24 NOTE — Progress Notes (Signed)
Mobility Specialist Progress Note:    11/24/22 1647  Mobility  Activity Refused mobility   Pt refused d/t family member in room and plan to shower soon. Will f/u tomorrow.   Thompson Grayer Mobility Specialist  Please contact vis Secure Chat or  Rehab Office [phone]

## 2022-11-25 ENCOUNTER — Encounter (HOSPITAL_COMMUNITY): Payer: Self-pay

## 2022-11-25 DIAGNOSIS — J45901 Unspecified asthma with (acute) exacerbation: Secondary | ICD-10-CM | POA: Diagnosis not present

## 2022-11-25 LAB — CBC
HCT: 44 % (ref 36.0–46.0)
Hemoglobin: 12.9 g/dL (ref 12.0–15.0)
MCH: 25.9 pg — ABNORMAL LOW (ref 26.0–34.0)
MCHC: 29.3 g/dL — ABNORMAL LOW (ref 30.0–36.0)
MCV: 88.4 fL (ref 80.0–100.0)
Platelets: 258 10*3/uL (ref 150–400)
RBC: 4.98 MIL/uL (ref 3.87–5.11)
RDW: 15.1 % (ref 11.5–15.5)
WBC: 7.6 10*3/uL (ref 4.0–10.5)
nRBC: 0 % (ref 0.0–0.2)

## 2022-11-25 LAB — BASIC METABOLIC PANEL
Anion gap: 13 (ref 5–15)
BUN: 21 mg/dL — ABNORMAL HIGH (ref 6–20)
CO2: 34 mmol/L — ABNORMAL HIGH (ref 22–32)
Calcium: 9.7 mg/dL (ref 8.9–10.3)
Chloride: 87 mmol/L — ABNORMAL LOW (ref 98–111)
Creatinine, Ser: 0.78 mg/dL (ref 0.44–1.00)
GFR, Estimated: >60 mL/min (ref >60)
Glucose, Bld: 318 mg/dL — ABNORMAL HIGH (ref 70–99)
Potassium: 3.5 mmol/L (ref 3.5–5.1)
Sodium: 134 mmol/L — ABNORMAL LOW (ref 135–145)

## 2022-11-25 LAB — GLUCOSE, CAPILLARY
Glucose-Capillary: 196 mg/dL — ABNORMAL HIGH (ref 70–99)
Glucose-Capillary: 220 mg/dL — ABNORMAL HIGH (ref 70–99)
Glucose-Capillary: 262 mg/dL — ABNORMAL HIGH (ref 70–99)
Glucose-Capillary: 296 mg/dL — ABNORMAL HIGH (ref 70–99)
Glucose-Capillary: 324 mg/dL — ABNORMAL HIGH (ref 70–99)
Glucose-Capillary: 367 mg/dL — ABNORMAL HIGH (ref 70–99)
Glucose-Capillary: 388 mg/dL — ABNORMAL HIGH (ref 70–99)

## 2022-11-25 MED ORDER — FUROSEMIDE 10 MG/ML IJ SOLN
40.0000 mg | Freq: Two times a day (BID) | INTRAMUSCULAR | Status: AC
  Administered 2022-11-25 (×2): 40 mg via INTRAVENOUS
  Filled 2022-11-25 (×2): qty 4

## 2022-11-25 MED ORDER — SPIRONOLACTONE 25 MG PO TABS
25.0000 mg | ORAL_TABLET | Freq: Every day | ORAL | Status: DC
  Administered 2022-11-25 – 2022-11-27 (×3): 25 mg via ORAL
  Filled 2022-11-25 (×3): qty 1

## 2022-11-25 MED ORDER — POTASSIUM CHLORIDE CRYS ER 20 MEQ PO TBCR
40.0000 meq | EXTENDED_RELEASE_TABLET | Freq: Once | ORAL | Status: AC
  Administered 2022-11-25: 40 meq via ORAL
  Filled 2022-11-25: qty 2

## 2022-11-25 MED ORDER — ACETAMINOPHEN 325 MG PO TABS
650.0000 mg | ORAL_TABLET | Freq: Four times a day (QID) | ORAL | Status: DC | PRN
  Administered 2022-11-25 – 2022-11-26 (×3): 650 mg via ORAL
  Filled 2022-11-25 (×3): qty 2

## 2022-11-25 MED ORDER — INSULIN GLARGINE-YFGN 100 UNIT/ML ~~LOC~~ SOLN
35.0000 [IU] | Freq: Every day | SUBCUTANEOUS | Status: DC
  Administered 2022-11-25 – 2022-11-27 (×3): 35 [IU] via SUBCUTANEOUS
  Filled 2022-11-25 (×3): qty 0.35

## 2022-11-25 NOTE — Progress Notes (Signed)
Physical Therapy Treatment Patient Details Name: Yvette Gibbs MRN: [id number] DOB: [birth year] Today's Date: 11/25/2022   History of Present Illness Pt is 32 yo female admitted on 11/22/22 with acute on chronic hypoxic and hypercarbic resp failure - suspected asthma exacerbation and may be a component of diastolic CHF.  Pt with hx including morbid obesity, DM2, asthma, OSA, HTN, dyslipidemia    PT Comments    Patient making excellent progress with mobility. Completed sit<>stand from recliner and toilet with supervision and no AD. Pt independent with toileting tasks 3/3. Pt amb ~200' with 1x standing rest break in hallway due to fatigue/SOB/slight dizzy sensation. HR in 120's-130's and SpO2 95% on RA. Pt able to amb back to room after break and seated rest in recliner improved symptoms. Will continue to progress pt as able.   Recommendations for follow up therapy are one component of a multi-disciplinary discharge planning process, led by the attending physician.  Recommendations may be updated based on patient status, additional functional criteria and insurance authorization.  Follow Up Recommendations       Assistance Recommended at Discharge PRN  Patient can return home with the following Assistance with cooking/housework;Help with stairs or ramp for entrance   Equipment Recommendations  None recommended by PT    Recommendations for Other Services       Precautions / Restrictions Precautions Precautions: None Restrictions Weight Bearing Restrictions: No     Mobility  Bed Mobility               General bed mobility comments: pt OOB in recliner    Transfers Overall transfer level: Needs assistance Equipment used: None Transfers: Sit to/from Stand Sit to Stand: Supervision           General transfer comment: sup, use of UE's    Ambulation/Gait Ambulation/Gait assistance: Supervision Gait Distance (Feet): 200 Feet Assistive device: None Gait  Pattern/deviations: Wide base of support Gait velocity: decreased     General Gait Details: Wide BOS with increased lateral sway but no LOB; slower speed and fatigued easily; pt taking 1x standing rest break and reporting mild dizzy/lightheaded feeling. improved some with rest break and pt amb back to room. HR in 120's-130's. SpO2 95% on RA.   Stairs             Wheelchair Mobility    Modified Rankin (Stroke Patients Only)       Balance Overall balance assessment: Mild deficits observed, not formally tested   Sitting balance-Leahy Scale: Good       Standing balance-Leahy Scale: Good                              Cognition Arousal/Alertness: Awake/alert Behavior During Therapy: WFL for tasks assessed/performed Overall Cognitive Status: Within Functional Limits for tasks assessed                                          Exercises      General Comments        Pertinent Vitals/Pain      Home Living                          Prior Function            PT Goals (current goals can now be found in the  care plan section) Acute Rehab PT Goals PT Goal Formulation: With patient Time For Goal Achievement: 12/07/22 Potential to Achieve Goals: Good Progress towards PT goals: Progressing toward goals    Frequency    Min 3X/week      PT Plan Current plan remains appropriate    Co-evaluation              AM-PAC PT "6 Clicks" Mobility   Outcome Measure  Help needed turning from your back to your side while in a flat bed without using bedrails?: A Little Help needed moving from lying on your back to sitting on the side of a flat bed without using bedrails?: A Little Help needed moving to and from a bed to a chair (including a wheelchair)?: A Little Help needed standing up from a chair using your arms (e.g., wheelchair or bedside chair)?: A Little Help needed to walk in hospital room?: A Little Help needed  climbing 3-5 steps with a railing? : A Little 6 Click Score: 18    End of Session   Activity Tolerance: Patient tolerated treatment well Patient left: in chair;with call bell/phone within reach;with family/visitor present Nurse Communication: Mobility status PT Visit Diagnosis: Other abnormalities of gait and mobility (R26.89)     Time: 1610-9604 PT Time Calculation (min) (ACUTE ONLY): 24 min  Charges:  $Gait Training: 8-22 mins $Therapeutic Activity: 8-22 mins                     Wynn Maudlin, DPT Acute Rehabilitation Services Office [phone]  11/25/22 3:51 PM

## 2022-11-25 NOTE — Progress Notes (Signed)
Mobility Specialist: Progress Note   11/25/22 1149  Mobility  Activity Ambulated with assistance in hallway  Level of Assistance Contact guard assist, steadying assist  Assistive Device None  Distance Ambulated (ft) 240 ft  Activity Response Tolerated well  Mobility Referral Yes  $Mobility charge 1 Mobility  Mobility Specialist Start Time (ACUTE ONLY) 1119  Mobility Specialist Stop Time (ACUTE ONLY) 1145  Mobility Specialist Time Calculation (min) (ACUTE ONLY) 26 min   Pre-Mobility: 94 HR, 97% SpO2 During Mobility: 122 HR Post-Mobility: 93 HR, 93% SpO2  Pt received in the bed and agreeable to mobility. Mod I with bed mobility and contact guard during ambulation. C/o headache, otherwise asymptomatic. Pt to the chair after session with family present in the room.   Brandyce Dimario Mobility Specialist Please contact via SecureChat or Rehab office at [phone]

## 2022-11-25 NOTE — Inpatient Diabetes Management (Signed)
Inpatient Diabetes Program Recommendations  AACE/ADA: New Consensus Statement on Inpatient Glycemic Control (2015)  Target Ranges:  Prepandial:   less than 140 mg/dL      Peak postprandial:   less than 180 mg/dL (1-2 hours)      Critically ill patients:  140 - 180 mg/dL   Lab Results  Component Value Date   GLUCAP 296 (H) 11/25/2022   HGBA1C 8.7 (H) 11/23/2022    Review of Glycemic Control  Latest Reference Range & Units 11/25/22 08:20 11/25/22 12:02  Glucose-Capillary 70 - 99 mg/dL 409 (H) 811 (H)  (H): Data is abnormally high  Diabetes history: DM2 Outpatient Diabetes medications: Ozempic 0.5 mg weekly Current orders for Inpatient glycemic control: Novolog 0-9 units Q4H, Novolog 8 units TID with meals, Semglee 35 (increased today), Solumedrol 40 mg QD  Inpatient Diabetes Program Recommendations:    Might consider:  Novolog 0-9 units TID and 0-5 units QHS as she is eating. If postprandials remain elevated, might consider increasing meal coverage to Novolog 10 units TID if she consumes at least 50%  Noted Semgle was increased today to 35 units QD.  Will continue to follow while inpatient.  Thank you, Dulce Sellar, MSN, CDCES Diabetes Coordinator Inpatient Diabetes Program [phone] (team pager from 8a-5p)

## 2022-11-25 NOTE — Progress Notes (Signed)
PROGRESS NOTE    Yvette Gibbs  RUE:[id number] DOB: 1991-05-03 DOA: 11/22/2022 PCP: Grayce Sessions, NP  [name] is a 32/F with severe morbid obesity, BMI of 68, type 2 diabetes mellitus, asthma, OSA/OHS, hypertension dyslipidemia presented to the ED with progressive dyspnea on exertion cough and wheezing. -In the ER she was hypoxic, placed on supplemental O2, hypercarbic with respiratory acidosis on ABG, placed on BiPAP, chest x-ray noted cardiomegaly and mild pulmonary vascular congestion. -Improving on treatment for asthma exacerbation and diastolic CHF -TOC working on NIV for OSA/OHS  Subjective: Feels a little better, breathing is improving  Assessment and Plan:  Acute on chronic hypoxic and hypercarbic respiratory failure -Primarily suspect asthma exacerbation at this time, may be a component of diastolic CHF 2, volume status is very difficult to assess -Cut down IV Solu-Medrol, continue DuoNebs, transition to prednisone tomorrow  -Continue nightly BiPAP -Increase activity, discharge planning   Acute on chronic diastolic CHF, mild pulmonary edema -IV Lasix today,  -Echo 3/24 with preserved EF,moderate LVH, RV normal respiratory related ventricular septal shift -Add Aldactone, avoid SGLT2i with morbid obesity and uncontrolled diabetes   OSA OHS -Apparently does not have a functional BiPAP at home now -[facility] consulted, plan for NIV -Will need pulmonary follow-up   Type 2 diabetes mellitus -Uncontrolled, increase Semglee, increase meal coverage -A1c is 8.7, CBGs worse in the setting of steroids   Morbid obesity -Needs diet and lifestyle modification,  -PCP please consider GLP-1 agonist at follow-up   DVT prophylaxis: Lovenox Code Status: Full code Family Communication: No family at bedside Disposition Plan: Home likely 1 to 2 days  Consultants:    Procedures:   Antimicrobials:    Objective: Vitals:   11/24/22 2018 11/25/22 0026 11/25/22 0449 11/25/22  0925  BP: (!) 148/102 (!) 131/93 128/84 (!) 158/103  Pulse: 99 89 77 87  Resp: 20 18 18 18   Temp: 98.1 F (36.7 C) 98.7 F (37.1 C) 98 F (36.7 C) 97.7 F (36.5 C)  TempSrc: Oral Oral Oral   SpO2: 98% 98% 96%   Weight:      Height:        Intake/Output Summary (Last 24 hours) at 11/25/2022 1121 Last data filed at 11/25/2022 1110 Gross per 24 hour  Intake 240 ml  Output 3750 ml  Net -3510 ml   Filed Weights   11/22/22 2208 11/23/22 1535  Weight: (!) 164.7 kg (!) 164.7 kg    Examination:  General exam: Morbidly obese chronically ill female sitting up in bed eating breakfast, AAOx3 HEENT: Neck obese unable to assess JVD Lungs: Improving air movement, rare expiratory wheezes Abdomen: Soft, nontender, bowel sounds present Extremities: Trace edema  Skin: No rashes Psychiatry:  Mood & affect appropriate.     Data Reviewed:   CBC: Recent Labs  Lab 11/22/22 2202 11/23/22 0019 11/23/22 0651 11/24/22 0123 11/25/22 0035  WBC 6.3  --   --  6.0 7.6  NEUTROABS 2.9  --   --   --   --   HGB 12.0 13.6 14.3 12.2 12.9  HCT 41.7 40.0 42.0 40.9 44.0  MCV 89.1  --   --  88.9 88.4  PLT 240  --   --  256 258   Basic Metabolic Panel: Recent Labs  Lab 11/22/22 2202 11/23/22 0019 11/23/22 0651 11/24/22 0123 11/25/22 0035  NA 136 137 135 133* 134*  K 4.2 4.0 4.9 4.5 3.5  CL 98  --   --  96* 87*  CO2  30  --   --  29 34*  GLUCOSE 211*  --   --  304* 318*  BUN 6  --   --  15 21*  CREATININE 0.67  --   --  0.80 0.78  CALCIUM 8.9  --   --  9.3 9.7  MG  --   --   --  1.8  --    GFR: Estimated Creatinine Clearance: 155.7 mL/min (by C-G formula based on SCr of 0.78 mg/dL). Liver Function Tests: Recent Labs  Lab 11/22/22 2202  AST 29  ALT 24  ALKPHOS 68  BILITOT 0.5  PROT 7.1  ALBUMIN 3.2*   No results for input(s): "LIPASE", "AMYLASE" in the last 168 hours. No results for input(s): "AMMONIA" in the last 168 hours. Coagulation Profile: No results for input(s): "INR",  "PROTIME" in the last 168 hours. Cardiac Enzymes: No results for input(s): "CKTOTAL", "CKMB", "CKMBINDEX", "TROPONINI" in the last 168 hours. BNP (last 3 results) No results for input(s): "PROBNP" in the last 8760 hours. HbA1C: Recent Labs    11/23/22 1846  HGBA1C 8.7*   CBG: Recent Labs  Lab 11/24/22 1627 11/24/22 2020 11/25/22 0028 11/25/22 0452 11/25/22 0820  GLUCAP 327* 362* 324* 220* 196*   Lipid Profile: No results for input(s): "CHOL", "HDL", "LDLCALC", "TRIG", "CHOLHDL", "LDLDIRECT" in the last 72 hours. Thyroid Function Tests: No results for input(s): "TSH", "T4TOTAL", "FREET4", "T3FREE", "THYROIDAB" in the last 72 hours. Anemia Panel: No results for input(s): "VITAMINB12", "FOLATE", "FERRITIN", "TIBC", "IRON", "RETICCTPCT" in the last 72 hours. Urine analysis:    Component Value Date/Time   COLORURINE YELLOW 06/09/2022 2155   APPEARANCEUR CLOUDY (A) 06/09/2022 2155   LABSPEC 1.019 06/09/2022 2155   PHURINE 7.0 06/09/2022 2155   GLUCOSEU NEGATIVE 06/09/2022 2155   HGBUR LARGE (A) 06/09/2022 2155   BILIRUBINUR NEGATIVE 06/09/2022 2155   KETONESUR NEGATIVE 06/09/2022 2155   PROTEINUR >=300 (A) 06/09/2022 2155   UROBILINOGEN 0.2 09/04/2014 1001   NITRITE NEGATIVE 06/09/2022 2155   LEUKOCYTESUR NEGATIVE 06/09/2022 2155   Sepsis Labs: @LABRCNTIP (procalcitonin:4,lacticidven:4)  )No results found for this or any previous visit (from the past 240 hour(s)).   Radiology Studies: No results found.   Scheduled Meds:  enoxaparin (LOVENOX) injection  80 mg Subcutaneous Q24H   furosemide  40 mg Intravenous Q12H   insulin aspart  0-9 Units Subcutaneous Q4H   insulin aspart  8 Units Subcutaneous TID WC   insulin glargine-yfgn  35 Units Subcutaneous Daily   ipratropium-albuterol  3 mL Nebulization TID   methylPREDNISolone (SOLU-MEDROL) injection  40 mg Intravenous Q24H   mometasone-formoterol  2 puff Inhalation BID   rosuvastatin  20 mg Oral Daily   Continuous  Infusions:   LOS: 2 days    Time spent:95min    Zannie Cove, MD [facility]   11/25/2022, 11:21 AM

## 2022-11-25 NOTE — Progress Notes (Signed)
   11/25/22 0022  BiPAP/CPAP/SIPAP  $ Non-Invasive Home Ventilator  Subsequent  BiPAP/CPAP/SIPAP Pt Type Adult  BiPAP/CPAP/SIPAP DREAMSTATIOND  Mask Type Full face mask  Mask Size Medium  Set Rate 12 breaths/min  Respiratory Rate 16 breaths/min  IPAP 16 cmH20  EPAP 8 cmH2O  Pressure Support 8 cmH20  FiO2 (%) 28 %  Leak 25  Peak Inspiratory Pressure (PIP) 13  Tidal Volume (Vt) 482  Patient Home Equipment No  Auto Titrate No

## 2022-11-25 NOTE — Progress Notes (Signed)
Patient's chronic respiratory failure due to obesity hypoventilation is life threatening. CPAP and Bilevel devices have been considered and ruled ineffective due to severity of patient's disease state. Patient would benefit from non-invasive ventilation during sleep. NIV will provide set tidal volumes and pressure support. Without this therapy, the patient is at high risk of ending up with worsening symptoms, worsened respiratory failure, need for ER visits and/or recurrent hospitalizations. Patient is able to protect airway and clear secretions adequately.

## 2022-11-26 ENCOUNTER — Other Ambulatory Visit (HOSPITAL_COMMUNITY): Payer: Self-pay

## 2022-11-26 ENCOUNTER — Encounter (HOSPITAL_COMMUNITY): Payer: Self-pay | Admitting: Internal Medicine

## 2022-11-26 DIAGNOSIS — I5033 Acute on chronic diastolic (congestive) heart failure: Secondary | ICD-10-CM | POA: Diagnosis not present

## 2022-11-26 DIAGNOSIS — I10 Essential (primary) hypertension: Secondary | ICD-10-CM

## 2022-11-26 DIAGNOSIS — G4733 Obstructive sleep apnea (adult) (pediatric): Secondary | ICD-10-CM

## 2022-11-26 DIAGNOSIS — J45901 Unspecified asthma with (acute) exacerbation: Secondary | ICD-10-CM | POA: Diagnosis not present

## 2022-11-26 LAB — BASIC METABOLIC PANEL
Anion gap: 10 (ref 5–15)
BUN: 20 mg/dL (ref 6–20)
CO2: 35 mmol/L — ABNORMAL HIGH (ref 22–32)
Calcium: 9.4 mg/dL (ref 8.9–10.3)
Chloride: 89 mmol/L — ABNORMAL LOW (ref 98–111)
Creatinine, Ser: 0.87 mg/dL (ref 0.44–1.00)
GFR, Estimated: >60 mL/min (ref >60)
Glucose, Bld: 320 mg/dL — ABNORMAL HIGH (ref 70–99)
Potassium: 3.6 mmol/L (ref 3.5–5.1)
Sodium: 134 mmol/L — ABNORMAL LOW (ref 135–145)

## 2022-11-26 LAB — GLUCOSE, CAPILLARY
Glucose-Capillary: 209 mg/dL — ABNORMAL HIGH (ref 70–99)
Glucose-Capillary: 222 mg/dL — ABNORMAL HIGH (ref 70–99)
Glucose-Capillary: 200 mg/dL — ABNORMAL HIGH (ref 70–99)
Glucose-Capillary: 268 mg/dL — ABNORMAL HIGH (ref 70–99)
Glucose-Capillary: 261 mg/dL — ABNORMAL HIGH (ref 70–99)
Glucose-Capillary: 246 mg/dL — ABNORMAL HIGH (ref 70–99)

## 2022-11-26 LAB — HCG, SERUM, QUALITATIVE: Preg, Serum: NEGATIVE

## 2022-11-26 MED ORDER — IPRATROPIUM-ALBUTEROL 0.5-2.5 (3) MG/3ML IN SOLN: 3.0000 mL | Freq: Four times a day (QID) | RESPIRATORY_TRACT | Status: DC | PRN

## 2022-11-26 MED ORDER — LOSARTAN POTASSIUM 25 MG PO TABS
25.0000 mg | ORAL_TABLET | Freq: Every day | ORAL | Status: DC
  Administered 2022-11-26 – 2022-11-27 (×2): 25 mg via ORAL
  Filled 2022-11-26 (×2): qty 1

## 2022-11-26 NOTE — Assessment & Plan Note (Signed)
Continue blood pressure control with losartan 

## 2022-11-26 NOTE — Progress Notes (Signed)
   11/26/22 2300  BiPAP/CPAP/SIPAP  BiPAP/CPAP/SIPAP Pt Type Adult  BiPAP/CPAP/SIPAP DREAMSTATIOND  Mask Type Full face mask  Mask Size Medium  Respiratory Rate 20 breaths/min  IPAP 12 cmH20  EPAP 6 cmH2O  Flow Rate 2 lpm  Patient Home Equipment No  Auto Titrate No

## 2022-11-26 NOTE — Progress Notes (Signed)
Mobility Specialist Progress Note:   11/26/22 1500  Mobility  Activity Ambulated with assistance in hallway  Level of Assistance Standby assist, set-up cues, supervision of patient - no hands on  Assistive Device None  Distance Ambulated (ft) 240 ft  Activity Response Tolerated well  Mobility Referral Yes  $Mobility charge 1 Mobility  Mobility Specialist Start Time (ACUTE ONLY) 1509  Mobility Specialist Stop Time (ACUTE ONLY) 1519  Mobility Specialist Time Calculation (min) (ACUTE ONLY) 10 min    Pre Mobility: 94 HR, 92% SpO2, 157/119 BP During Mobility: 120 HR, 157/119 BP Post Mobility: 104 HR, 95% SpO2, 171/119 BP  Pt received in chair, agreeable to mobility. Ambulated in hallway w/ supervision, no c/o. Left Pt in chair with call bell.  [place]ante Earlene Plater Mobility Specialist Please contact via Special educational needs teacher or Rehab office at [phone]

## 2022-11-26 NOTE — TOC Benefit Eligibility Note (Signed)
Pharmacy Patient Advocate Encounter  Insurance verification completed.    The patient is insured through [employer]  Ran test claim for losartan 25 mg and the current 30 day co-pay is $1.23  Ran test claim for rosuvastatin (Crestor) 20 mg and the current 30 day co-pay is $2.99.  Ran test claim for spironolactone 25 mg and the current 30 day co-pay is $1.51.   This test claim was processed through [employer]- copay amounts may vary at other pharmacies due to pharmacy/plan contracts, or as the patient moves through the different stages of their insurance plan.    Yvette Gibbs, CPHT Pharmacy Patient Advocate Specialist [facility] Patient Advocate Team Direct Number: [phone]  Fax: [phone]

## 2022-11-26 NOTE — Assessment & Plan Note (Signed)
Obesity hypoventilation syndrome.  Patient will need Bipap at home.

## 2022-11-26 NOTE — TOC Progression Note (Signed)
Transition of Care (TOC) - Progression Note  Donn Pierini RN, BSN Transitions of Care Unit 4E- RN Case Manager See Treatment Team for direct phone #   Patient Details  Name: Mkenzie Dotts MRN: [id number] Date of Birth: [birth year]  Transition of Care [facility]) CM/SW Contact  Zenda Alpers Lenn Sink, RN Phone Number: 11/26/2022, 2:27 PM  Clinical Narrative:    Per MD pt now agreeable to NIV for home. CM has reached out to [employer] for NIV needs- order form has been signed by MD and faxed back to Fayrene Fearing at [facility] pending- anticipate NIV will be ready for delivery tomorrow to the home.   TOC to continue to follow   Expected Discharge Plan: Home/Self Care Barriers to Discharge: Continued Medical Work up  Expected Discharge Plan and Services   Discharge Planning Services: CM Consult   Living arrangements for the past 2 months: Single Family Home                 DME Arranged: NIV DME Agency: [facility] Date DME Agency Contacted: 11/26/22 Time DME Agency Contacted: 1000 Representative spoke with at DME Agency: Fayrene Fearing             Social Determinants of Health (SDOH) Interventions SDOH Screenings   Food Insecurity: Food Insecurity Present (08/25/2022)  Housing: Low Risk  (08/25/2022)  Transportation Needs: Unmet Transportation Needs (08/25/2022)  Utilities: Not At Risk (08/25/2022)  Depression (PHQ2-9): High Risk (07/01/2022)  Financial Resource Strain: Low Risk  (05/05/2021)  Tobacco Use: Medium Risk (11/25/2022)    Readmission Risk Interventions    12/21/2020   11:54 AM  Readmission Risk Prevention Plan  Transportation Screening Complete  Medication Review Oceanographer) Complete  HRI or Home Care Consult Complete  SW Recovery Care/Counseling Consult Complete  Palliative Care Screening Not Applicable  Skilled Nursing Facility Not Applicable

## 2022-11-26 NOTE — Assessment & Plan Note (Signed)
Continue antiacid therapy  ?

## 2022-11-26 NOTE — Progress Notes (Signed)
   11/26/22 0105  BiPAP/CPAP/SIPAP  $ Non-Invasive Home Ventilator  Subsequent  BiPAP/CPAP/SIPAP Pt Type Adult  BiPAP/CPAP/SIPAP DREAMSTATIOND  Mask Type Full face mask  Mask Size Medium  Set Rate 12 breaths/min  Respiratory Rate 22 breaths/min  IPAP 16 cmH20  EPAP 6 cmH2O  Pressure Support 10 cmH20  FiO2 (%) 28 %  Minute Ventilation 10.3  Leak 15  Peak Inspiratory Pressure (PIP) 12  Tidal Volume (Vt) 488  Patient Home Equipment No  Auto Titrate No  CPAP/SIPAP surface wiped down Yes

## 2022-11-26 NOTE — Assessment & Plan Note (Addendum)
Echocardiogram with preserved LV systolic function with EF 60 to 65%, moderate LVH, RV with preserved systolic function. Trivial pericardial effusion.   Patient was placed on IV furosemide and oral spironolactone for diuresis, negative fluid balance was achieved, - 7,760 ml, with significant improvement in her symptoms.   At the time of her discharge she will continue medical therapy with losartan and spironolactone.  Follow up as outpatient.

## 2022-11-26 NOTE — Assessment & Plan Note (Addendum)
Calculated BMI is 70 Added flexeril for muscle spasms, avoid opioid analgesics.

## 2022-11-26 NOTE — Assessment & Plan Note (Addendum)
Patient with improved oxygenation, her 02 saturation is 100% on room air today.  Plan to continue with non invasive mechanical ventilation at night.  She was treated with methylprednisolone 40 mg IV daily, aggressive bronchodilator therapy and inhaled corticosteroids.   Patient will need close follow up as outpatient. At home she will continue with non invasive mechanical ventilation, for obesity hypoventilation syndrome and sleep apnea.  Follow up as outpatient.

## 2022-11-26 NOTE — Hospital Course (Addendum)
Mrs. Yvette Gibbs was admitted to the working diagnosis of acute on chronic respiratory failure.   32 yo female with the past medical history of obesity class 3, asthma, T2DM, hypertension, dyslipidemia, OSA, and OHS who presented with progressive dyspnea for 3 days. Positive pleuritic chest pain and wheezing. On her initial physical examination her blood pressure was 137/99, HR 101, rr 21 and 02 saturation 100% on Bipap, her lungs had diffuse wheezing, with poor inspiratory effort, heart with S1 and S2 present and rhythmic, abdomen with no distention and trace lower extremity edema.   Na 136, K 4,2 Cl 98 bicarbonate 30, glucose 211, bun 6 cr 0,67 High sensitive troponin 8 and 7  Wbc 6,3 hgb 12.0 plt 240   Chest radiograph with cardiomegaly with bilateral hilar vascular congestion with no effusions.   EKG 104 bom, left axis deviation, right bundle branch block, sinus rhythm with no significant ST segment or T wave changes.  Patient was placed on non invasive mechanical ventilation, bronchodilator therapy and systemic corticosteroids.  IV furosemide for volume overload.   06/14 patient clinically improved, she will continue non invasive mechanical ventilation at home at night.

## 2022-11-26 NOTE — Progress Notes (Signed)
  Progress Note   Patient: Yvette Gibbs ZOX:[id number] DOB: 1991/02/04 DOA: 11/22/2022     3 DOS: the patient was seen and examined on 11/26/2022   Brief hospital course: Mrs. Yvette Gibbs was admitted to the working diagnosis of acute on chronic respiratory failure.   32 yo female with the past medical history of obesity class 3, asthma, T2DM, hypertension, dyslipidemia, OSA, and OHS who presented with progressive dyspnea for 3 days. Positive pleuritic chest pain and wheezing. On her initial physical examination her blood pressure was 137/99, HR 101, rr 21 and 02 saturation 100% on Bipap, her lungs had diffuse wheezing, with poor inspiratory effort, heart with S1 and S2 present and rhythmic, abdomen with no distention and trace lower extremity edema.   Na 136, K 4,2 Cl 98 bicarbonate 30, glucose 211, bun 6 cr 0,67 High sensitive troponin 8 and 7  Wbc 6,3 hgb 12.0 plt 240   Chest radiograph with cardiomegaly with bilateral hilar vascular congestion with no effusions.   EKG 104 bom, left axis deviation, right bundle branch block, sinus rhythm with no significant ST segment or T wave changes.  Patient was placed on non invasive mechanical ventilation, bronchodilator therapy and systemic corticosteroids.  IV furosemide for volume overload.    Assessment and Plan: * Acute asthma exacerbation Patient with improved oxygenation, her 02 saturation is 99% on room air today.  Plan to continue with non invasive mechanical ventilation for sleep.  Continue methylprednisolone 40 mg IV daily. Aggressive bronchodilator therapy and inhaled corticosteroids.  Out of bed to chair tid with meals OT and Pt.  Possible discharge tomorrow.   Acute on chronic diastolic CHF (congestive heart failure) (HCC) Echocardiogram with preserved LV systolic function with EF 60 to 65%, moderate LVH, RV with preserved systolic function. Trivial pericardial effusion.   Urine output documented 3,650 ml Since admission  negative fluid balance -7.440 ml Plan to continue diuresis with oral furosemide.  Continue diuresis with oral spironolactone.   HTN (hypertension) Continue blood pressure control with losartan.  Diabetes mellitus type 2, insulin dependent (HCC) Uncontrolled hyperglycemia with fasting glucose of 320.  Plan to continue insulin sliding scale for glucose cover and monitoring.  Patient will benefit from GLP1 agonist.    Gastroesophageal reflux disease without esophagitis Continue antiacid therapy.   Morbid obesity (HCC) Calculated BMI is 70  OSA (obstructive sleep apnea) Obesity hypoventilation syndrome.  Patient will need Bipap at home.         Subjective: Patient with improvement in dyspnea, no chest pain, this morning with somnolence.   Physical Exam: Vitals:   11/26/22 0500 11/26/22 0823 11/26/22 0829 11/26/22 0834  BP:   138/80   Pulse:  88 85   Resp: 12 19 19    Temp:      TempSrc:   Oral   SpO2:  99% 99% 99%  Weight: (!) 175.4 kg     Height:       Neurology awake and alert ENT with no pallor Cardiovascular with S1 and S2 present and rhythmic with no gallops, rubs or murmurs Respiratory with no wheezing on anterior auscultation, no rhonchi Abdomen with no distention  Trace non pitting lower extremity edema  Data Reviewed:    Family Communication: no family at the bedside   Disposition: Status is: Inpatient Remains inpatient appropriate because: respiratory failure   Planned Discharge Destination: Home    Author: Coralie Keens, MD 11/26/2022 10:10 AM  For on call review [email or web address].

## 2022-11-26 NOTE — Assessment & Plan Note (Addendum)
Uncontrolled T2DM with hyperglycemia.  Plan to continue insulin sliding scale for glucose cover and monitoring.  Resume semaglutide and insulin at the time of her discharge.  Will give prescription for glucometer.

## 2022-11-27 ENCOUNTER — Other Ambulatory Visit (HOSPITAL_COMMUNITY): Payer: Self-pay

## 2022-11-27 DIAGNOSIS — I5033 Acute on chronic diastolic (congestive) heart failure: Secondary | ICD-10-CM | POA: Diagnosis not present

## 2022-11-27 DIAGNOSIS — I10 Essential (primary) hypertension: Secondary | ICD-10-CM | POA: Diagnosis not present

## 2022-11-27 DIAGNOSIS — J45901 Unspecified asthma with (acute) exacerbation: Secondary | ICD-10-CM | POA: Diagnosis not present

## 2022-11-27 DIAGNOSIS — G4733 Obstructive sleep apnea (adult) (pediatric): Secondary | ICD-10-CM | POA: Diagnosis not present

## 2022-11-27 LAB — BASIC METABOLIC PANEL
Anion gap: 10 (ref 5–15)
BUN: 18 mg/dL (ref 6–20)
CO2: 33 mmol/L — ABNORMAL HIGH (ref 22–32)
Calcium: 9.5 mg/dL (ref 8.9–10.3)
Chloride: 93 mmol/L — ABNORMAL LOW (ref 98–111)
Creatinine, Ser: 0.76 mg/dL (ref 0.44–1.00)
GFR, Estimated: >60 mL/min (ref >60)
Glucose, Bld: 212 mg/dL — ABNORMAL HIGH (ref 70–99)
Potassium: 3.9 mmol/L (ref 3.5–5.1)
Sodium: 136 mmol/L (ref 135–145)

## 2022-11-27 LAB — GLUCOSE, CAPILLARY
Glucose-Capillary: 174 mg/dL — ABNORMAL HIGH (ref 70–99)
Glucose-Capillary: 183 mg/dL — ABNORMAL HIGH (ref 70–99)

## 2022-11-27 LAB — MAGNESIUM: Magnesium: 1.9 mg/dL (ref 1.7–2.4)

## 2022-11-27 MED ORDER — OZEMPIC (0.25 OR 0.5 MG/DOSE) 2 MG/3ML ~~LOC~~ SOPN
0.2500 mg | PEN_INJECTOR | SUBCUTANEOUS | 0 refills | Status: DC
  Filled 2022-11-27: qty 3, 30d supply, fill #0

## 2022-11-27 MED ORDER — CYCLOBENZAPRINE HCL 5 MG PO TABS
5.0000 mg | ORAL_TABLET | Freq: Three times a day (TID) | ORAL | 0 refills | Status: DC | PRN
  Filled 2022-11-27: qty 10, 4d supply, fill #0

## 2022-11-27 MED ORDER — CYCLOBENZAPRINE HCL 10 MG PO TABS: 5.0000 mg | ORAL_TABLET | Freq: Three times a day (TID) | ORAL | Status: DC | PRN

## 2022-11-27 MED ORDER — ALBUTEROL SULFATE (2.5 MG/3ML) 0.083% IN NEBU
2.5000 mg | INHALATION_SOLUTION | RESPIRATORY_TRACT | 0 refills | Status: DC | PRN
  Filled 2022-11-27: qty 90, 5d supply, fill #0

## 2022-11-27 MED ORDER — GLUCOSE BLOOD VI STRP
1.0000 | ORAL_STRIP | Freq: Three times a day (TID) | 0 refills | Status: DC
  Filled 2022-11-27: qty 50, 17d supply, fill #0

## 2022-11-27 MED ORDER — BLOOD GLUCOSE MONITOR SYSTEM W/DEVICE KIT
1.0000 | PACK | Freq: Three times a day (TID) | 0 refills | Status: DC
  Filled 2022-11-27: qty 1, 1d supply, fill #0

## 2022-11-27 MED ORDER — LANTUS SOLOSTAR 100 UNIT/ML ~~LOC~~ SOPN
25.0000 [IU] | PEN_INJECTOR | Freq: Every day | SUBCUTANEOUS | 0 refills | Status: DC
  Filled 2022-11-27: qty 6, 24d supply, fill #0

## 2022-11-27 MED ORDER — LOSARTAN POTASSIUM 25 MG PO TABS
25.0000 mg | ORAL_TABLET | Freq: Every day | ORAL | 0 refills | Status: DC
  Filled 2022-11-27: qty 30, 30d supply, fill #0

## 2022-11-27 MED ORDER — SPIRONOLACTONE 25 MG PO TABS
25.0000 mg | ORAL_TABLET | Freq: Every day | ORAL | 0 refills | Status: DC
  Filled 2022-11-27: qty 30, 30d supply, fill #0

## 2022-11-27 MED ORDER — LANCET DEVICE MISC
1.0000 | Freq: Three times a day (TID) | 0 refills | Status: AC
  Filled 2022-11-27: qty 1, 30d supply, fill #0

## 2022-11-27 MED ORDER — BUDESONIDE-FORMOTEROL FUMARATE 160-4.5 MCG/ACT IN AERO
2.0000 | INHALATION_SPRAY | Freq: Two times a day (BID) | RESPIRATORY_TRACT | 0 refills | Status: DC
Start: 2022-11-27 — End: 2022-12-11
  Filled 2022-11-27: qty 10.2, 30d supply, fill #0

## 2022-11-27 MED ORDER — ROSUVASTATIN CALCIUM 20 MG PO TABS
20.0000 mg | ORAL_TABLET | Freq: Every day | ORAL | 0 refills | Status: DC
Start: 2022-11-27 — End: 2022-12-11
  Filled 2022-11-27: qty 30, 30d supply, fill #0

## 2022-11-27 MED ORDER — ACCU-CHEK SOFTCLIX LANCETS MISC
1.0000 | Freq: Three times a day (TID) | 0 refills | Status: DC
  Filled 2022-11-27: qty 100, 30d supply, fill #0

## 2022-11-27 MED ORDER — ACETAMINOPHEN 325 MG PO TABS: 650.0000 mg | ORAL_TABLET | Freq: Four times a day (QID) | ORAL | Status: DC | PRN

## 2022-11-27 NOTE — Discharge Summary (Signed)
Physician Discharge Summary   Patient: Yvette Gibbs MRN: [id number] DOB: Oct 18, 1990  Admit date:     11/22/2022  Discharge date: 11/27/22  Discharge Physician: York Ram Kamaiyah Uselton   PCP: Grayce Sessions, NP   Recommendations at discharge:    Patient has been placed on losartan and spironolactone for hypertension and heart failure. Non invasive mechanical ventilation when sleeping.  Follow up with [name] NP in 7 to 10 days. Follow up renal function and electrolytes in 7 days.  Resume basal insulin and GLP 1 agonist.  Prescription for new glucometer.    Discharge Diagnoses: Principal Problem:   Acute asthma exacerbation Active Problems:   Acute on chronic diastolic CHF (congestive heart failure) (HCC)   HTN (hypertension)   Diabetes mellitus type 2, insulin dependent (HCC)   Gastroesophageal reflux disease without esophagitis   OSA (obstructive sleep apnea)   Class 3 obesity (HCC)  Resolved Problems:   * No resolved hospital problems. [facility] Course: Mrs. Erwin was admitted to the working diagnosis of acute on chronic respiratory failure.   32 yo female with the past medical history of obesity class 3, asthma, T2DM, hypertension, dyslipidemia, OSA, and OHS who presented with progressive dyspnea for 3 days. Positive pleuritic chest pain and wheezing. On her initial physical examination her blood pressure was 137/99, HR 101, rr 21 and 02 saturation 100% on Bipap, her lungs had diffuse wheezing, with poor inspiratory effort, heart with S1 and S2 present and rhythmic, abdomen with no distention and trace lower extremity edema.   Na 136, K 4,2 Cl 98 bicarbonate 30, glucose 211, bun 6 cr 0,67 High sensitive troponin 8 and 7  Wbc 6,3 hgb 12.0 plt 240   Chest radiograph with cardiomegaly with bilateral hilar vascular congestion with no effusions.   EKG 104 bom, left axis deviation, right bundle branch block, sinus rhythm with no significant ST segment or T  wave changes.  Patient was placed on non invasive mechanical ventilation, bronchodilator therapy and systemic corticosteroids.  IV furosemide for volume overload.   06/14 patient clinically improved, she will continue non invasive mechanical ventilation at home at night.    Assessment and Plan: * Acute asthma exacerbation Patient with improved oxygenation, her 02 saturation is 100% on room air today.  Plan to continue with non invasive mechanical ventilation at night.  She was treated with methylprednisolone 40 mg IV daily, aggressive bronchodilator therapy and inhaled corticosteroids.   Patient will need close follow up as outpatient. At home she will continue with non invasive mechanical ventilation, for obesity hypoventilation syndrome and sleep apnea.  Follow up as outpatient.   Acute on chronic diastolic CHF (congestive heart failure) (HCC) Echocardiogram with preserved LV systolic function with EF 60 to 65%, moderate LVH, RV with preserved systolic function. Trivial pericardial effusion.   Patient was placed on IV furosemide and oral spironolactone for diuresis, negative fluid balance was achieved, - 7,760 ml, with significant improvement in her symptoms.   At the time of her discharge she will continue medical therapy with losartan and spironolactone.  Follow up as outpatient.   HTN (hypertension) Continue blood pressure control with losartan.  Diabetes mellitus type 2, insulin dependent (HCC) Uncontrolled T2DM with hyperglycemia.  Plan to continue insulin sliding scale for glucose cover and monitoring.  Resume semaglutide and insulin at the time of her discharge.  Will give prescription for glucometer.   Gastroesophageal reflux disease without esophagitis Continue antiacid therapy.   OSA (obstructive sleep apnea) Obesity hypoventilation  syndrome.  Patient will need Bipap at home.   Class 3 obesity (HCC) Calculated BMI is 70 Added flexeril for muscle spasms, avoid  opioid analgesics.         Consultants: none  Procedures performed: none  Disposition: Home Diet recommendation:  Cardiac and Carb modified diet DISCHARGE MEDICATION: Allergies as of 11/27/2022       Reactions   Bee Pollen Other (See Comments)   Sneezing and eye burning   Fish Allergy Itching, Rash   Metformin And Related Itching, Rash   Shellfish Allergy Itching, Rash   Zestril [lisinopril] Rash, Other (See Comments)   Diaphoresis         Medication List     STOP taking these medications    propranolol 10 MG tablet Commonly known as: INDERAL       TAKE these medications    Accu-Chek Guide test strip Generic drug: glucose blood Use to check blood sugar TID. E11.9 What changed: Another medication with the same name was added. Make sure you understand how and when to take each.   BLOOD GLUCOSE TEST STRIPS Strp 1 each by In Vitro route in the morning, at noon, and at bedtime. May substitute to any manufacturer covered by patient's insurance. What changed: You were already taking a medication with the same name, and this prescription was added. Make sure you understand how and when to take each.   Accu-Chek Guide w/Device Kit Use to check blood sugar TID. E11.9 What changed: Another medication with the same name was added. Make sure you understand how and when to take each.   Blood Glucose Monitoring Suppl Devi 1 each by Does not apply route in the morning, at noon, and at bedtime. May substitute to any manufacturer covered by patient's insurance. What changed: You were already taking a medication with the same name, and this prescription was added. Make sure you understand how and when to take each.   Accu-Chek Softclix Lancets lancets Use to check blood sugar TID. E11.9   acetaminophen 325 MG tablet Commonly known as: TYLENOL Take 2 tablets (650 mg total) by mouth every 6 (six) hours as needed for mild pain or headache. What changed:  medication  strength how much to take reasons to take this   albuterol 108 (90 Base) MCG/ACT inhaler Commonly known as: VENTOLIN HFA Inhale 2 puffs into the lungs every 6 (six) hours as needed for wheezing or shortness of breath (Do not use in addition to albuterol nebulizer).   albuterol (2.5 MG/3ML) 0.083% nebulizer solution Commonly known as: PROVENTIL Take 3 mLs (2.5 mg total) by nebulization every 4 (four) hours as needed for wheezing or shortness of breath. Do not use in addition to albuterol rescue inhaler   budesonide-formoterol 160-4.5 MCG/ACT inhaler Commonly known as: Symbicort Inhale 2 puffs into the lungs 2 (two) times daily.   cyclobenzaprine 5 MG tablet Commonly known as: FLEXERIL Take 1 tablet (5 mg total) by mouth 3 (three) times daily as needed for muscle spasms.   Global Ease Inject Pen Needles 32G X 4 MM Misc Generic drug: Insulin Pen Needle USE TO INJECT lantus EVERY DAY AS DIRECTED   Lancet Device Misc 1 each by Does not apply route in the morning, at noon, and at bedtime. May substitute to any manufacturer covered by patient's insurance.   Lancets Misc. Misc 1 each by Does not apply route in the morning, at noon, and at bedtime. May substitute to any manufacturer covered by patient's insurance.  Lantus SoloStar 100 UNIT/ML Solostar Pen Generic drug: insulin glargine Inject 25 Units into the skin daily.   losartan 25 MG tablet Commonly known as: COZAAR Take 1 tablet (25 mg total) by mouth daily.   Ozempic (0.25 or 0.5 MG/DOSE) 2 MG/3ML Sopn Generic drug: Semaglutide(0.25 or 0.5MG /DOS) Inject 0.25 mg into the skin once a week. For 4 weeks. Then, increase to 0.5 mg into the skin once a week thereafter. What changed:  how much to take additional instructions   rosuvastatin 20 MG tablet Commonly known as: CRESTOR Take 1 tablet (20 mg total) by mouth daily.   spironolactone 25 MG tablet Commonly known as: ALDACTONE Take 1 tablet (25 mg total) by mouth daily.         Discharge Exam: Filed Weights   11/23/22 1535 11/26/22 0500 11/27/22 0348  Weight: (!) 164.7 kg (!) 175.4 kg (!) 174.8 kg   BP (!) 136/98 (BP Location: Left Wrist)   Pulse 88   Temp 98.1 F (36.7 C) (Oral)   Resp 16   Ht 5\' 2"  (1.575 m)   Wt (!) 174.8 kg   LMP 08/27/2022   SpO2 99%   Breastfeeding No   BMI 70.48 kg/m   Patient is feeling well, no chest pain or dyspnea.  Neurology awake and alert ENT with mild pallor  Cardiovascular with S1 and S2 present and rhythmic, no gallops, rubs or murmurs Respiratory with no rales or wheezing, no rhonchi Abdomen with no distention  No lower extremity edema   Condition at discharge: stable  The results of significant diagnostics from this hospitalization (including imaging, microbiology, ancillary and laboratory) are listed below for reference.   Imaging Studies: DG Chest Portable 1 View  Result Date: 11/22/2022 CLINICAL DATA:  Shortness of breath. EXAM: PORTABLE CHEST 1 VIEW COMPARISON:  August 24, 2022 FINDINGS: The cardiac silhouette is mildly enlarged and unchanged in size. There is stable prominence of the perihilar pulmonary vasculature. Mild atelectasis is seen along the infrahilar region on the right. There is no evidence of focal consolidation, pleural effusion or pneumothorax. The visualized skeletal structures are unremarkable. IMPRESSION: 1. Stable cardiomegaly with mild pulmonary vascular congestion. 2. Mild right infrahilar atelectasis. Electronically Signed   By: Aram Candela M.D.   On: 11/22/2022 22:27   DG Hand Complete Left  Result Date: 10/29/2022 CLINICAL DATA:  Left hand pain EXAM: LEFT HAND - COMPLETE 3+ VIEW COMPARISON:  None Available. FINDINGS: There is no evidence of fracture or dislocation. There is no evidence of arthropathy or other focal bone abnormality. Soft tissues are unremarkable. IMPRESSION: Negative. Electronically Signed   By: Helyn Numbers M.D.   On: 10/29/2022 00:36   DG Wrist  Complete Left  Result Date: 10/29/2022 CLINICAL DATA:  Left wrist pain EXAM: LEFT WRIST - COMPLETE 3+ VIEW COMPARISON:  None Available. FINDINGS: There is no evidence of fracture or dislocation. There is no evidence of arthropathy or other focal bone abnormality. Soft tissues are unremarkable. IMPRESSION: Negative. Electronically Signed   By: Helyn Numbers M.D.   On: 10/29/2022 00:35    Microbiology: Results for orders placed or performed during the hospital encounter of 08/24/22  Resp panel by RT-PCR (RSV, Flu A&B, Covid) Anterior Nasal Swab     Status: None   Collection Time: 08/25/22  2:54 AM   Specimen: Anterior Nasal Swab  Result Value Ref Range Status   SARS Coronavirus 2 by RT PCR NEGATIVE NEGATIVE Final   Influenza A by PCR NEGATIVE NEGATIVE Final  Influenza B by PCR NEGATIVE NEGATIVE Final    Comment: (NOTE) The Xpert Xpress SARS-CoV-2/FLU/RSV plus assay is intended as an aid in the diagnosis of influenza from Nasopharyngeal swab specimens and should not be used as a sole basis for treatment. Nasal washings and aspirates are unacceptable for Xpert Xpress SARS-CoV-2/FLU/RSV testing.  Fact Sheet for Patients: BloggerCourse.com  Fact Sheet for Healthcare Providers: SeriousBroker.it  This test is not yet approved or cleared by the [place] FDA and has been authorized for detection and/or diagnosis of SARS-CoV-2 by FDA under an Emergency Use Authorization (EUA). This EUA will remain in effect (meaning this test can be used) for the duration of the COVID-19 declaration under Section 564(b)(1) of the Act, 21 U.S.C. section 360bbb-3(b)(1), unless the authorization is terminated or revoked.     Resp Syncytial Virus by PCR NEGATIVE NEGATIVE Final    Comment: (NOTE) Fact Sheet for Patients: BloggerCourse.com  Fact Sheet for Healthcare Providers: SeriousBroker.it  This test  is not yet approved or cleared by the [place] FDA and has been authorized for detection and/or diagnosis of SARS-CoV-2 by FDA under an Emergency Use Authorization (EUA). This EUA will remain in effect (meaning this test can be used) for the duration of the COVID-19 declaration under Section 564(b)(1) of the Act, 21 U.S.C. section 360bbb-3(b)(1), unless the authorization is terminated or revoked.  Performed at [facility], [address] [place], [place] [address]   Respiratory (~20 pathogens) panel by PCR     Status: None   Collection Time: 08/25/22  1:07 PM   Specimen: Nasopharyngeal Swab; Respiratory  Result Value Ref Range Status   Adenovirus NOT DETECTED NOT DETECTED Final   Coronavirus 229E NOT DETECTED NOT DETECTED Final    Comment: (NOTE) The Coronavirus on the Respiratory Panel, DOES NOT test for the novel  Coronavirus (2019 nCoV)    Coronavirus HKU1 NOT DETECTED NOT DETECTED Final   Coronavirus NL63 NOT DETECTED NOT DETECTED Final   Coronavirus OC43 NOT DETECTED NOT DETECTED Final   Metapneumovirus NOT DETECTED NOT DETECTED Final   Rhinovirus / Enterovirus NOT DETECTED NOT DETECTED Final   Influenza A NOT DETECTED NOT DETECTED Final   Influenza B NOT DETECTED NOT DETECTED Final   Parainfluenza Virus 1 NOT DETECTED NOT DETECTED Final   Parainfluenza Virus 2 NOT DETECTED NOT DETECTED Final   Parainfluenza Virus 3 NOT DETECTED NOT DETECTED Final   Parainfluenza Virus 4 NOT DETECTED NOT DETECTED Final   Respiratory Syncytial Virus NOT DETECTED NOT DETECTED Final   Bordetella pertussis NOT DETECTED NOT DETECTED Final   Bordetella Parapertussis NOT DETECTED NOT DETECTED Final   Chlamydophila pneumoniae NOT DETECTED NOT DETECTED Final   Mycoplasma pneumoniae NOT DETECTED NOT DETECTED Final    Comment: Performed at [facility], [address] [place], [place] [address]    Labs: CBC: Recent Labs  Lab 11/22/22 2202 11/23/22 0019 11/23/22 0651  11/24/22 0123 11/25/22 0035  WBC 6.3  --   --  6.0 7.6  NEUTROABS 2.9  --   --   --   --   HGB 12.0 13.6 14.3 12.2 12.9  HCT 41.7 40.0 42.0 40.9 44.0  MCV 89.1  --   --  88.9 88.4  PLT 240  --   --  256 258   Basic Metabolic Panel: Recent Labs  Lab 11/22/22 2202 11/23/22 0019 11/23/22 0651 11/24/22 0123 11/25/22 0035 11/26/22 0045 11/27/22 0102  NA 136   < > 135 133*  134* 134* 136  K 4.2   < > 4.9 4.5 3.5 3.6 3.9  CL 98  --   --  96* 87* 89* 93*  CO2 30  --   --  29 34* 35* 33*  GLUCOSE 211*  --   --  304* 318* 320* 212*  BUN 6  --   --  15 21* 20 18  CREATININE 0.67  --   --  0.80 0.78 0.87 0.76  CALCIUM 8.9  --   --  9.3 9.7 9.4 9.5  MG  --   --   --  1.8  --   --  1.9   < > = values in this interval not displayed.   Liver Function Tests: Recent Labs  Lab 11/22/22 2202  AST 29  ALT 24  ALKPHOS 68  BILITOT 0.5  PROT 7.1  ALBUMIN 3.2*   CBG: Recent Labs  Lab 11/26/22 1609 11/26/22 1923 11/26/22 2304 11/27/22 0333 11/27/22 0807  GLUCAP 268* 261* 246* 174* 183*    Discharge time spent: greater than 30 minutes.  Signed: Coralie Keens, MD [facility] 11/27/2022

## 2022-11-27 NOTE — TOC Transition Note (Signed)
Transition of Care (TOC) - CM/SW Discharge Note Donn Pierini RN, BSN Transitions of Care Unit 4E- RN Case Manager See Treatment Team for direct phone #   Patient Details  Name: Yvette Gibbs MRN: [id number] Date of Birth: [birth year]  Transition of Care [facility]) CM/SW Contact:  Darrold Span, RN Phone Number: 11/27/2022, 12:24 PM   Clinical Narrative:    Pt stable for transition home today, NIV has been approved and [employer] plans to deliver to home this afternoon and educate with RT in the home.   CM spoke with pt at bedside to instruct that [facility] will be contacting her to schedule home visit for later this afternoon for NIV delivery. Pt voiced her sister has taken her nebulizer home already that [employer] delivered earlier in the week.   Pt voiced her sister will come transport home.   No further TOC needs noted.    Final next level of care: Home/Self Care Barriers to Discharge: Barriers Resolved   Patient Goals and CMS Choice CMS Medicare.gov Compare Post Acute Care list provided to:: Patient Choice offered to / list presented to : NA  Discharge Placement                 Home        Discharge Plan and Services Additional resources added to the After Visit Summary for     Discharge Planning Services: CM Consult Post Acute Care Choice: Durable Medical Equipment          DME Arranged: NIV DME Agency: [facility] Date DME Agency Contacted: 11/26/22 Time DME Agency Contacted: 1000 Representative spoke with at DME Agency: Gaylan Gerold Agency: NA        Social Determinants of Health (SDOH) Interventions SDOH Screenings   Food Insecurity: Patient Declined (11/26/2022)  Housing: High Risk (11/26/2022)  Transportation Needs: Unmet Transportation Needs (11/26/2022)  Utilities: Not At Risk (11/26/2022)  Depression (PHQ2-9): High Risk (07/01/2022)  Financial Resource Strain: Low Risk  (05/05/2021)  Tobacco Use: Medium Risk (11/26/2022)     Readmission Risk  Interventions    11/27/2022   12:24 PM 12/21/2020   11:54 AM  Readmission Risk Prevention Plan  Transportation Screening Complete Complete  HRI or Home Care Consult Complete   Social Work Consult for Recovery Care Planning/Counseling Complete   Palliative Care Screening Not Applicable   Medication Review Oceanographer) Complete Complete  HRI or Home Care Consult  Complete  SW Recovery Care/Counseling Consult  Complete  Palliative Care Screening  Not Applicable  Skilled Nursing Facility  Not Applicable

## 2022-11-30 ENCOUNTER — Telehealth: Payer: Self-pay

## 2022-11-30 NOTE — Transitions of Care (Post Inpatient/ED Visit) (Signed)
   11/30/2022  Name: Yvette Gibbs MRN: [id number] DOB: 20-Apr-1991  Today's TOC FU Call Status: Today's TOC FU Call Status:: Unsuccessul Call (1st Attempt) Unsuccessful Call (1st Attempt) Date: 11/30/22  Attempted to reach the patient regarding the most recent Inpatient/ED visit.  Follow Up Plan: Additional outreach attempts will be made to reach the patient to complete the Transitions of Care (Post Inpatient/ED visit) call.   Signature  Robyne Peers, RN

## 2022-12-01 ENCOUNTER — Other Ambulatory Visit: Payer: Self-pay

## 2022-12-01 ENCOUNTER — Other Ambulatory Visit: Payer: Self-pay | Admitting: Pharmacist

## 2022-12-01 ENCOUNTER — Telehealth: Payer: Self-pay

## 2022-12-01 DIAGNOSIS — E119 Type 2 diabetes mellitus without complications: Secondary | ICD-10-CM

## 2022-12-01 MED ORDER — ACCU-CHEK SOFTCLIX LANCETS MISC: 3 refills | Status: DC

## 2022-12-01 MED ORDER — ACCU-CHEK GUIDE W/DEVICE KIT
PACK | 0 refills | Status: DC
Start: 2022-12-01 — End: 2022-12-11

## 2022-12-01 MED ORDER — ACCU-CHEK GUIDE VI STRP
ORAL_STRIP | 3 refills | Status: DC
Start: 2022-12-01 — End: 2022-12-11

## 2022-12-01 NOTE — Telephone Encounter (Signed)
From the discharge call:   Overall, she stated she is feeling better but she continues to have muscle spasms in her back and under her shoulders. when she lays down or sits up in certian ways. she has not been able to pick up the flexeril yet because she could not afford it.   I reviewed the medication list with her and she said she does not have any of her meds. She later clarified that she has 2 lantus pens She stated that she went to the pharmacy to pick them up and was told that it would be $114 for all of the rx that were filled and she said she could not afford that and did not get any. She then said that she [facility] and has not heard from them.  She is willing to have the rx transferred to [facility]  - [facility]. Lauro Regulus, Pharm Tech notified and will have rx transferred to [facility] and she said they should be ready after 1200 tomorrow.  She also noted that the patient can set up a payment plan if needed because the cost of [facility] meds [facility].  I explained to the patient that the meds should be ready for pick up after noon tomorrow and I also explained about the payment plan. I asked if she has someone who can pick them up for her and she said she will try.  I told her to please call the pharmacy if she needs them delivered or to let me know if she is having problems getting them.    The patient has a nebulizer  but needs a glucometer.   Follow-up Provider: [name], NP - 12/10/2022.

## 2022-12-01 NOTE — Transitions of Care (Post Inpatient/ED Visit) (Signed)
12/01/2022  Name: Yvette Gibbs MRN: [id number] DOB: September 16, 1990  Today's TOC FU Call Status: Today's TOC FU Call Status:: Successful TOC FU Call Competed Unsuccessful Call (1st Attempt) Date: 11/30/22 [facility] FU Call Complete Date: 12/01/22  Transition Care Management Follow-up Telephone Call Date of Discharge: 11/27/22 Discharge Facility: [facility]) Type of Discharge: Inpatient Admission Primary Inpatient Discharge Diagnosis:: acute asthma exacerbation How have you been since you were released from the hospital?: Better (She said she continues to have muscle spasms in her back and under her shoulders. when she lays down or sits up in certian ways. she has not been able to pick up the flexeril yet because she could not afford it.) Any questions or concerns?: Yes Patient Questions/Concerns:: She is concerned about the cost of her medications.  Please see below. Patient Questions/Concerns Addressed: Notified Provider of Patient Questions/Concerns, Other: (also notified Tree surgeon at [facility].)  Items Reviewed: Did you receive and understand the discharge instructions provided?: Yes Medications obtained,verified, and reconciled?: Partial Review Completed Reason for Partial Mediation Review: I reviewed the medication list with her and she said she does not have any of her meds. She later clarified that she has 2 lantus pens She stated that she went to the pharmacy to pick them up and was told that it would be $114 for all of the rx that were filled and she said she could not afford that and did not get any. She then said that she requested the [facility] and has not heard from them.  She is willing to have the rx transferred to [facility]  - [facility]. Lauro Regulus, Pharm Tech notified and will have rx transferred to [facility] and she said they should be ready after 1200 tomorrow.  She also noted that the patient can  set up a payment plan if needed because the cost of the meds could [facility].  I explained to the patient that the meds should be ready for pick up after noon tomorrow and I also explained about the payment plan. I asked if she has someone who can pick them up for her and she said she will try.  I told her to please call the pharmacy if she needs them delivered or to let me know if she is having problems getting them.  The patient has a nebulizer  but needs a glucometer. Any new allergies since your discharge?: No Dietary orders reviewed?: Yes Type of Diet Ordered:: heart healthy low sodium, diabetic Do you have support at home?: Yes People in Home: alone Name of Support/Comfort Primary Source: she said she  receives some assistance from family/friends.  Medications Reviewed Today: Medications Reviewed Today     Reviewed by Tamera Reason, [facility] (Pharmacist) on 11/24/22 at 1621  Med List Status: Complete   Medication Order Taking? Sig Documenting Provider Last Dose Status Informant  Accu-Chek Softclix Lancets lancets [id number]  Use to check blood sugar TID. E11.9 Hoy Register, MD  Active Self, Pharmacy Records  acetaminophen (TYLENOL) 500 MG tablet [id number] Yes Take 1,000 mg by mouth every 6 (six) hours as needed for mild pain, fever or headache. [provider] Past Week Active Self, Pharmacy Records  albuterol (PROVENTIL) (2.5 MG/3ML) 0.083% nebulizer solution [id number] Yes Take 3 mLs (2.5 mg total) by nebulization every 4 (four) hours as needed for wheezing or shortness of breath (Do not use in addition to albuterol inhaler).  Patient taking differently: Take 2.5 mg by nebulization every 4 (four) hours as needed for wheezing or shortness of breath. Do not use in addition to albuterol rescue inhaler   Narda Bonds, MD 11/22/2022 Active Self, Pharmacy Records  albuterol (VENTOLIN HFA) 108 (90 Base) MCG/ACT inhaler [id number] Yes Inhale 2 puffs into the lungs every 6 (six) hours  as needed for wheezing or shortness of breath (Do not use in addition to albuterol nebulizer). Narda Bonds, MD 11/22/2022 Active Self, Pharmacy Records  Blood Glucose Monitoring Suppl (ACCU-CHEK GUIDE) w/Device KIT [id number]  Use to check blood sugar TID. E11.9 Hoy Register, MD  Active Self, Pharmacy Records  budesonide-formoterol [facility]) 160-4.5 MCG/ACT inhaler [id number] No Inhale 2 puffs into the lungs 2 (two) times daily.  Patient not taking: Reported on 11/23/2022   Grayce Sessions, NP Not Taking Active Self, Pharmacy Records  Patient not taking:  Discontinued 11/24/22 1621 (Completed Course) GLOBAL EASE INJECT PEN NEEDLES 32G X 4 MM MISC [id number]  USE TO INJECT lantus EVERY DAY AS DIRECTED Grayce Sessions, NP  Active Self, Pharmacy Records  glucose blood (ACCU-CHEK GUIDE) test strip [id number]  Use to check blood sugar TID. E11.9 Hoy Register, MD  Active Self, Pharmacy Records  Patient not taking:  Discontinued 11/24/22 1621 (Discontinued by provider) insulin glargine (LANTUS SOLOSTAR) 100 UNIT/ML Solostar Pen [id number] No Inject 25 Units into the skin daily.  Patient not taking: Reported on 11/23/2022   Grayce Sessions, NP Not Taking Active Self, Pharmacy Records  losartan (COZAAR) 25 MG tablet [id number] No Take 1 tablet (25 mg total) by mouth daily.  Patient not taking: Reported on 11/23/2022   Narda Bonds, MD Not Taking Active Self, Pharmacy Records  Patient not taking:  Discontinued 11/24/22 1620 (Completed Course) propranolol (INDERAL) 10 MG tablet [id number] No Take 1 tablet (10 mg total) by mouth 2 (two) times daily.  Patient not taking: Reported on 11/23/2022   Hoy Register, MD Not Taking Active Self, Pharmacy Records  rosuvastatin (CRESTOR) 20 MG tablet [id number] No Take 1 tablet (20 mg total) by mouth daily.  Patient not taking: Reported on 11/23/2022   Hoy Register, MD Not Taking Active Self, Pharmacy Records  Semaglutide,0.25 or 0.5MG /DOS, (OZEMPIC,  0.25 OR 0.5 MG/DOSE,) 2 MG/3ML SOPN [id number] Yes Inject 0.25 mg into the skin once a week. For 4 weeks. Then, increase to 0.5 mg into the skin once a week thereafter.  Patient taking differently: Inject 0.5 mg into the skin once a week. Monday   Hoy Register, MD Past Week Active Self, Pharmacy Records           Med Note (COFFELL, Marzella Schlein   Mon Nov 23, 2022  9:55 AM) Pt states she takes this medication. Per dispense report, LF 09/04/2022 #62mL, 42 DS.            Home Care and Equipment/Supplies: Were Home Health Services Ordered?: No Any new equipment or medical supplies ordered?: Yes Name of Medical supply agency?: [facility] Were you able to get the equipment/medical supplies?: Yes Do you have any questions related to the use of the equipment/supplies?: No  Functional Questionnaire: Do you need assistance with bathing/showering or dressing?: No Do you need assistance with meal preparation?: No Do you need assistance with eating?: No Do you have difficulty maintaining continence: No Do you need assistance with getting out of bed/getting out of a chair/moving?: No Do you have difficulty managing or taking your medications?: No  Follow up  appointments reviewed: PCP Follow-up appointment confirmed?: Yes Date of PCP follow-up appointment?: 12/10/22 Follow-up Provider: [name], NP Specialist Hospital Follow-up appointment confirmed?: Yes Date of Specialist follow-up appointment?: 12/04/22 Follow-Up Specialty Provider:: pulmonary Do you need transportation to your follow-up appointment?: No Do you understand care options if your condition(s) worsen?: Yes-patient verbalized understanding    SIGNATURE Robyne Peers, RN

## 2022-12-03 NOTE — Telephone Encounter (Signed)
Contacted pt. Pt states she doesn't have a ride. Pt asked where medications are at. Will reach out to [facility]

## 2022-12-04 ENCOUNTER — Inpatient Hospital Stay (HOSPITAL_BASED_OUTPATIENT_CLINIC_OR_DEPARTMENT_OTHER): Payer: No Typology Code available for payment source | Admitting: Pulmonary Disease

## 2022-12-08 ENCOUNTER — Encounter (HOSPITAL_BASED_OUTPATIENT_CLINIC_OR_DEPARTMENT_OTHER): Payer: Self-pay | Admitting: Pulmonary Disease

## 2022-12-09 NOTE — Telephone Encounter (Signed)
Left VM with pt.

## 2022-12-10 ENCOUNTER — Ambulatory Visit (INDEPENDENT_AMBULATORY_CARE_PROVIDER_SITE_OTHER): Payer: No Typology Code available for payment source | Admitting: Primary Care

## 2022-12-11 ENCOUNTER — Other Ambulatory Visit: Payer: Self-pay | Admitting: Pharmacist

## 2022-12-11 ENCOUNTER — Other Ambulatory Visit: Payer: Self-pay

## 2022-12-11 DIAGNOSIS — J4520 Mild intermittent asthma, uncomplicated: Secondary | ICD-10-CM

## 2022-12-11 DIAGNOSIS — E119 Type 2 diabetes mellitus without complications: Secondary | ICD-10-CM

## 2022-12-11 DIAGNOSIS — F321 Major depressive disorder, single episode, moderate: Secondary | ICD-10-CM | POA: Diagnosis not present

## 2022-12-11 MED ORDER — LANTUS SOLOSTAR 100 UNIT/ML ~~LOC~~ SOPN
25.0000 [IU] | PEN_INJECTOR | Freq: Every day | SUBCUTANEOUS | 0 refills | Status: DC
Start: 2022-12-11 — End: 2023-07-16
  Filled 2022-12-11: qty 6, 24d supply, fill #0

## 2022-12-11 MED ORDER — OZEMPIC (0.25 OR 0.5 MG/DOSE) 2 MG/3ML ~~LOC~~ SOPN
0.2500 mg | PEN_INJECTOR | SUBCUTANEOUS | 0 refills | Status: DC
Start: 2022-12-11 — End: 2023-09-07
  Filled 2022-12-11: qty 3, 42d supply, fill #0

## 2022-12-11 MED ORDER — ROSUVASTATIN CALCIUM 20 MG PO TABS
20.0000 mg | ORAL_TABLET | Freq: Every day | ORAL | 0 refills | Status: DC
Start: 2022-12-11 — End: 2023-07-16
  Filled 2022-12-11: qty 30, 30d supply, fill #0

## 2022-12-11 MED ORDER — CYCLOBENZAPRINE HCL 5 MG PO TABS
5.0000 mg | ORAL_TABLET | Freq: Three times a day (TID) | ORAL | 0 refills | Status: AC | PRN
  Filled 2022-12-11: qty 10, 4d supply, fill #0

## 2022-12-11 MED ORDER — SPIRONOLACTONE 25 MG PO TABS
25.0000 mg | ORAL_TABLET | Freq: Every day | ORAL | 0 refills | Status: DC
  Filled 2022-12-11: qty 30, 30d supply, fill #0

## 2022-12-11 MED ORDER — LOSARTAN POTASSIUM 25 MG PO TABS
25.0000 mg | ORAL_TABLET | Freq: Every day | ORAL | 0 refills | Status: DC
  Filled 2022-12-11: qty 30, 30d supply, fill #0

## 2022-12-11 MED ORDER — ALBUTEROL SULFATE (2.5 MG/3ML) 0.083% IN NEBU
2.5000 mg | INHALATION_SOLUTION | RESPIRATORY_TRACT | 0 refills | Status: AC | PRN
  Filled 2022-12-11: qty 90, 5d supply, fill #0

## 2022-12-11 MED ORDER — ACCU-CHEK GUIDE W/DEVICE KIT
PACK | 0 refills | Status: DC
Start: 2022-12-11 — End: 2023-07-12
  Filled 2022-12-11: qty 1, 30d supply, fill #0

## 2022-12-11 MED ORDER — ACCU-CHEK GUIDE VI STRP
ORAL_STRIP | 3 refills | Status: DC
  Filled 2022-12-11: qty 100, 33d supply, fill #0

## 2022-12-11 MED ORDER — ACCU-CHEK SOFTCLIX LANCETS MISC
3 refills | Status: DC
Start: 2022-12-11 — End: 2023-07-12
  Filled 2022-12-11: qty 100, 33d supply, fill #0

## 2022-12-11 MED ORDER — BUDESONIDE-FORMOTEROL FUMARATE 160-4.5 MCG/ACT IN AERO
2.0000 | INHALATION_SPRAY | Freq: Two times a day (BID) | RESPIRATORY_TRACT | 0 refills | Status: DC
Start: 2022-12-11 — End: 2023-07-16
  Filled 2022-12-11: qty 10.2, 30d supply, fill #0

## 2022-12-11 NOTE — Telephone Encounter (Signed)
Call placed to patient unable to reach message left on VM.  To advise patient that all her medication is at     [facility] [address], [address], [place] [address] Phone: [phone]

## 2022-12-14 ENCOUNTER — Telehealth: Payer: Self-pay

## 2022-12-14 DIAGNOSIS — Z419 Encounter for procedure for purposes other than remedying health state, unspecified: Secondary | ICD-10-CM | POA: Diagnosis not present

## 2022-12-14 NOTE — Telephone Encounter (Signed)
Addendum to prior note, I was not able to reach the patient and had to leave a message requesting a call back.

## 2022-12-14 NOTE — Telephone Encounter (Signed)
Call placed to patient  [phone]  to inform her that her prescriptions were sent to [facility]

## 2022-12-14 NOTE — Telephone Encounter (Signed)
Opened in error

## 2022-12-15 DIAGNOSIS — F321 Major depressive disorder, single episode, moderate: Secondary | ICD-10-CM | POA: Diagnosis not present

## 2022-12-16 ENCOUNTER — Other Ambulatory Visit: Payer: Self-pay

## 2022-12-16 NOTE — Telephone Encounter (Signed)
Call placed to patient  [phone]  again to inform her that her prescriptions were sent to [facility]. I had to leave a message requesting a call back

## 2022-12-16 NOTE — Telephone Encounter (Signed)
Per April Procita, RPH/ [facility] [place], they  filled everything 5 days ago but she has not picked up her 11 rxs

## 2022-12-18 ENCOUNTER — Other Ambulatory Visit: Payer: Self-pay

## 2022-12-22 NOTE — Telephone Encounter (Signed)
Call placed to patient  [phone]  again to inform her that her prescriptions were sent to [facility] at [facility]. I had to leave a message requesting a call back  

## 2022-12-25 DIAGNOSIS — F321 Major depressive disorder, single episode, moderate: Secondary | ICD-10-CM | POA: Diagnosis not present

## 2023-01-01 DIAGNOSIS — F321 Major depressive disorder, single episode, moderate: Secondary | ICD-10-CM | POA: Diagnosis not present

## 2023-01-04 DIAGNOSIS — F321 Major depressive disorder, single episode, moderate: Secondary | ICD-10-CM | POA: Diagnosis not present

## 2023-01-08 DIAGNOSIS — F321 Major depressive disorder, single episode, moderate: Secondary | ICD-10-CM | POA: Diagnosis not present

## 2023-01-14 DIAGNOSIS — F321 Major depressive disorder, single episode, moderate: Secondary | ICD-10-CM | POA: Diagnosis not present

## 2023-01-14 DIAGNOSIS — Z419 Encounter for procedure for purposes other than remedying health state, unspecified: Secondary | ICD-10-CM | POA: Diagnosis not present

## 2023-01-20 DIAGNOSIS — F321 Major depressive disorder, single episode, moderate: Secondary | ICD-10-CM | POA: Diagnosis not present

## 2023-01-22 DIAGNOSIS — F321 Major depressive disorder, single episode, moderate: Secondary | ICD-10-CM | POA: Diagnosis not present

## 2023-01-28 DIAGNOSIS — F321 Major depressive disorder, single episode, moderate: Secondary | ICD-10-CM | POA: Diagnosis not present

## 2023-02-05 DIAGNOSIS — F321 Major depressive disorder, single episode, moderate: Secondary | ICD-10-CM | POA: Diagnosis not present

## 2023-02-14 DIAGNOSIS — Z419 Encounter for procedure for purposes other than remedying health state, unspecified: Secondary | ICD-10-CM | POA: Diagnosis not present

## 2023-02-18 DIAGNOSIS — F321 Major depressive disorder, single episode, moderate: Secondary | ICD-10-CM | POA: Diagnosis not present

## 2023-02-26 DIAGNOSIS — F321 Major depressive disorder, single episode, moderate: Secondary | ICD-10-CM | POA: Diagnosis not present

## 2023-03-01 DIAGNOSIS — F321 Major depressive disorder, single episode, moderate: Secondary | ICD-10-CM | POA: Diagnosis not present

## 2023-03-04 ENCOUNTER — Telehealth (INDEPENDENT_AMBULATORY_CARE_PROVIDER_SITE_OTHER): Payer: Self-pay

## 2023-03-04 NOTE — Telephone Encounter (Signed)
Contacted pt to schedule a bp check pt didn't answer lvm  If pt calls back please schedule nurse visit for bp check will need to be before the end of the month

## 2023-03-16 DIAGNOSIS — Z419 Encounter for procedure for purposes other than remedying health state, unspecified: Secondary | ICD-10-CM | POA: Diagnosis not present

## 2023-03-18 DIAGNOSIS — F321 Major depressive disorder, single episode, moderate: Secondary | ICD-10-CM | POA: Diagnosis not present

## 2023-03-29 DIAGNOSIS — F321 Major depressive disorder, single episode, moderate: Secondary | ICD-10-CM | POA: Diagnosis not present

## 2023-04-12 DIAGNOSIS — F321 Major depressive disorder, single episode, moderate: Secondary | ICD-10-CM | POA: Diagnosis not present

## 2023-04-16 DIAGNOSIS — Z419 Encounter for procedure for purposes other than remedying health state, unspecified: Secondary | ICD-10-CM | POA: Diagnosis not present

## 2023-04-17 ENCOUNTER — Emergency Department (HOSPITAL_COMMUNITY): Payer: Medicaid Other

## 2023-04-17 ENCOUNTER — Emergency Department (HOSPITAL_COMMUNITY)
Admission: EM | Admit: 2023-04-17 | Discharge: 2023-04-17 | Disposition: A | Discharge status: Home / Self Care | Payer: Medicaid Other | Attending: Emergency Medicine | Admitting: Emergency Medicine

## 2023-04-17 ENCOUNTER — Encounter (HOSPITAL_COMMUNITY): Payer: Self-pay

## 2023-04-17 DIAGNOSIS — R519 Headache, unspecified: Secondary | ICD-10-CM | POA: Diagnosis not present

## 2023-04-17 DIAGNOSIS — I110 Hypertensive heart disease with heart failure: Secondary | ICD-10-CM | POA: Diagnosis not present

## 2023-04-17 DIAGNOSIS — I509 Heart failure, unspecified: Secondary | ICD-10-CM | POA: Insufficient documentation

## 2023-04-17 DIAGNOSIS — R0789 Other chest pain: Secondary | ICD-10-CM | POA: Diagnosis not present

## 2023-04-17 DIAGNOSIS — R200 Anesthesia of skin: Secondary | ICD-10-CM | POA: Diagnosis not present

## 2023-04-17 DIAGNOSIS — R0781 Pleurodynia: Secondary | ICD-10-CM | POA: Diagnosis present

## 2023-04-17 DIAGNOSIS — Z79899 Other long term (current) drug therapy: Secondary | ICD-10-CM | POA: Insufficient documentation

## 2023-04-17 DIAGNOSIS — E119 Type 2 diabetes mellitus without complications: Secondary | ICD-10-CM | POA: Diagnosis not present

## 2023-04-17 DIAGNOSIS — Z794 Long term (current) use of insulin: Secondary | ICD-10-CM | POA: Insufficient documentation

## 2023-04-17 LAB — CBC
HCT: 36.9 % (ref 36.0–46.0)
Hemoglobin: 11.6 g/dL — ABNORMAL LOW (ref 12.0–15.0)
MCH: 28.4 pg (ref 26.0–34.0)
MCHC: 31.4 g/dL (ref 30.0–36.0)
MCV: 90.4 fL (ref 80.0–100.0)
Platelets: 250 10*3/uL (ref 150–400)
RBC: 4.08 MIL/uL (ref 3.87–5.11)
RDW: 13.1 % (ref 11.5–15.5)
WBC: 5 10*3/uL (ref 4.0–10.5)
nRBC: 0 % (ref 0.0–0.2)

## 2023-04-17 LAB — I-STAT CHEM 8, ED
BUN: <3 mg/dL — ABNORMAL LOW (ref 6–20)
Calcium, Ion: 1.06 mmol/L — ABNORMAL LOW (ref 1.15–1.40)
Chloride: 104 mmol/L (ref 98–111)
Creatinine, Ser: 0.6 mg/dL (ref 0.44–1.00)
Glucose, Bld: 90 mg/dL (ref 70–99)
HCT: 36 % (ref 36.0–46.0)
Hemoglobin: 12.2 g/dL (ref 12.0–15.0)
Potassium: 4 mmol/L (ref 3.5–5.1)
Sodium: 138 mmol/L (ref 135–145)
TCO2: 25 mmol/L (ref 22–32)

## 2023-04-17 LAB — BASIC METABOLIC PANEL
Anion gap: 7 (ref 5–15)
BUN: <5 mg/dL — ABNORMAL LOW (ref 6–20)
CO2: 28 mmol/L (ref 22–32)
Calcium: 9.1 mg/dL (ref 8.9–10.3)
Chloride: 103 mmol/L (ref 98–111)
Creatinine, Ser: 0.63 mg/dL (ref 0.44–1.00)
GFR, Estimated: >60 mL/min (ref >60)
Glucose, Bld: 101 mg/dL — ABNORMAL HIGH (ref 70–99)
Potassium: 3.9 mmol/L (ref 3.5–5.1)
Sodium: 138 mmol/L (ref 135–145)

## 2023-04-17 LAB — TROPONIN I (HIGH SENSITIVITY)
Troponin I (High Sensitivity): 4 ng/L (ref <18)
Troponin I (High Sensitivity): 4 ng/L (ref <18)

## 2023-04-17 LAB — BRAIN NATRIURETIC PEPTIDE: B Natriuretic Peptide: 39.1 pg/mL (ref 0.0–100.0)

## 2023-04-17 MED ORDER — METOCLOPRAMIDE HCL 5 MG/ML IJ SOLN
10.0000 mg | Freq: Once | INTRAMUSCULAR | Status: AC
  Administered 2023-04-17: 10 mg via INTRAVENOUS
  Filled 2023-04-17: qty 2

## 2023-04-17 MED ORDER — LOSARTAN POTASSIUM 25 MG PO TABS: 25.0000 mg | ORAL_TABLET | Freq: Every day | ORAL | 0 refills | Status: DC

## 2023-04-17 MED ORDER — IPRATROPIUM-ALBUTEROL 0.5-2.5 (3) MG/3ML IN SOLN
3.0000 mL | Freq: Once | RESPIRATORY_TRACT | Status: AC
  Administered 2023-04-17: 3 mL via RESPIRATORY_TRACT
  Filled 2023-04-17: qty 3

## 2023-04-17 MED ORDER — METHYLPREDNISOLONE SODIUM SUCC 125 MG IJ SOLR
125.0000 mg | Freq: Once | INTRAMUSCULAR | Status: AC
  Administered 2023-04-17: 125 mg via INTRAVENOUS
  Filled 2023-04-17: qty 2

## 2023-04-17 MED ORDER — BUTALBITAL-APAP-CAFFEINE 50-325-40 MG PO TABS
1.0000 | ORAL_TABLET | Freq: Once | ORAL | Status: DC
  Filled 2023-04-17: qty 1

## 2023-04-17 MED ORDER — IOHEXOL 350 MG/ML SOLN
75.0000 mL | Freq: Once | INTRAVENOUS | Status: AC | PRN
  Administered 2023-04-17: 75 mL via INTRAVENOUS

## 2023-04-17 MED ORDER — DIPHENHYDRAMINE HCL 50 MG/ML IJ SOLN
25.0000 mg | Freq: Once | INTRAMUSCULAR | Status: AC
  Administered 2023-04-17: 25 mg via INTRAVENOUS
  Filled 2023-04-17: qty 1

## 2023-04-17 MED ORDER — ASPIRIN 81 MG PO CHEW
324.0000 mg | CHEWABLE_TABLET | Freq: Once | ORAL | Status: AC
  Administered 2023-04-17: 324 mg via ORAL
  Filled 2023-04-17: qty 4

## 2023-04-17 MED ORDER — IBUPROFEN 800 MG PO TABS
800.0000 mg | ORAL_TABLET | Freq: Once | ORAL | Status: DC
  Filled 2023-04-17: qty 1

## 2023-04-17 MED ORDER — SPIRONOLACTONE 25 MG PO TABS: 25.0000 mg | ORAL_TABLET | Freq: Every day | ORAL | 0 refills | Status: DC

## 2023-04-17 MED ORDER — KETOROLAC TROMETHAMINE 30 MG/ML IJ SOLN
30.0000 mg | Freq: Once | INTRAMUSCULAR | Status: AC
  Administered 2023-04-17: 30 mg via INTRAVENOUS
  Filled 2023-04-17: qty 1

## 2023-04-17 NOTE — ED Triage Notes (Signed)
[facility] reports pt coming from home for chest pain for the past two weeks. Hurts on exertion, denies any n/v or sob.

## 2023-04-17 NOTE — Discharge Instructions (Addendum)
We worked you up for chest pain and left-sided numbness.  Your testing was unremarkable did not show concern for heart attack or fluid overload.  Your imaging showed no evidence of stroke or blood clot in your lungs.  Your blood pressure remained high therefore I refilled your losartan and spironolactone and sent this to your pharmacy.  Please follow-up with your PCP within 1 month for further care regarding this.

## 2023-04-17 NOTE — ED Notes (Signed)
IM paged for [facility] RN

## 2023-04-17 NOTE — ED Provider Notes (Signed)
[place] EMERGENCY DEPARTMENT AT [facility] Provider Note   CSN: [id number] Arrival date & time: 04/17/23  1415     History  Chief Complaint  Patient presents with   Chest Pain    Yvette Gibbs is a 32 y.o. female.  She presents today with pleuritic chest pain which is also tender to palpation.  Secondarily, has headache and left-sided upper and lower extremity numbness beginning at 11 AM on 04/16/2023.  This has been present for 2 weeks however recently worsened in the last 24 hours.  She tried using her albuterol inhaler which was not beneficial.  She does endorse being out of her blood pressure medications for the last 8 to 9 days. She does have medical history of obesity, T2DM, HTN, GERD, CHF.   Chest Pain Associated symptoms: headache, numbness and shortness of breath       Home Medications Prior to Admission medications   Medication Sig Start Date End Date Taking? Authorizing Provider  losartan (COZAAR) 25 MG tablet Take 1 tablet (25 mg total) by mouth daily. 04/17/23  Yes Shelby Mattocks, DO  spironolactone (ALDACTONE) 25 MG tablet Take 1 tablet (25 mg total) by mouth daily. 04/17/23 04/16/24 Yes Shelby Mattocks, DO  Accu-Chek Softclix Lancets lancets Use to check blood sugar 3 times daily. 12/11/22   Hoy Register, MD  acetaminophen (TYLENOL) 325 MG tablet Take 2 tablets (650 mg total) by mouth every 6 (six) hours as needed for mild pain or headache. 11/27/22   Arrien, York Ram, MD  albuterol (PROVENTIL) (2.5 MG/3ML) 0.083% nebulizer solution Use 1 vial (2.5 mg total) by nebulization every 4 (four) hours as needed for wheezing or shortness of breath. Do not use in addition to albuterol rescue inhaler 12/11/22 01/10/23  Hoy Register, MD  albuterol (VENTOLIN HFA) 108 (90 Base) MCG/ACT inhaler Inhale 2 puffs into the lungs every 6 (six) hours as needed for wheezing or shortness of breath (Do not use in addition to albuterol nebulizer). 08/27/22   Narda Bonds, MD  Blood Glucose Monitoring Suppl (ACCU-CHEK GUIDE) w/Device KIT Use to check blood sugar 3 times daily. 12/11/22   Hoy Register, MD  budesonide-formoterol (SYMBICORT) 160-4.5 MCG/ACT inhaler Inhale 2 puffs into the lungs 2 (two) times daily. 12/11/22 01/10/23  Hoy Register, MD  cyclobenzaprine (FLEXERIL) 5 MG tablet Take 1 tablet (5 mg total) by mouth 3 (three) times daily as needed for muscle spasms. 12/11/22   Hoy Register, MD  GLOBAL EASE INJECT PEN NEEDLES 32G X 4 MM MISC USE TO INJECT lantus EVERY DAY AS DIRECTED 07/03/22   Grayce Sessions, NP  glucose blood (ACCU-CHEK GUIDE) test strip Use to check blood sugar 3 times daily. 12/11/22   Hoy Register, MD  insulin glargine (LANTUS SOLOSTAR) 100 UNIT/ML Solostar Pen Inject 25 Units into the skin daily. 12/11/22   Hoy Register, MD  rosuvastatin (CRESTOR) 20 MG tablet Take 1 tablet (20 mg total) by mouth daily. 12/11/22   Hoy Register, MD  Semaglutide,0.25 or 0.5MG /DOS, (OZEMPIC, 0.25 OR 0.5 MG/DOSE,) 2 MG/3ML SOPN Inject 0.25 mg into the skin once a week. For 4 weeks. Then, increase to 0.5 mg into the skin once a week thereafter. 12/11/22   Hoy Register, MD      Allergies    Bee pollen, Fish allergy, Metformin and related, Shellfish allergy, and Zestril [lisinopril]    Review of Systems   Review of Systems  Respiratory:  Positive for chest tightness and shortness of breath.   Cardiovascular:  Positive for chest pain.  Neurological:  Positive for numbness and headaches.   Physical Exam Updated Vital Signs BP (!) 145/112   Pulse 84   Temp 98.3 F (36.8 C) (Oral)   Resp (!) 22   SpO2 100%  General: Calm appearing, NAD, laying in bed CV: RRR, no murmurs auscultated Pulm: Diminished breath sounds in lower lobes bilaterally with expiratory wheezing Neuro: CN II through XII intact, 4/5 grip strength of left hand, 5/5 strength of upper and lower extremities bilaterally otherwise, like sensation to left upper and lower  extremity  ED Results / Procedures / Treatments   Labs (all labs ordered are listed, but only abnormal results are displayed) Labs Reviewed  BASIC METABOLIC PANEL - Abnormal; Notable for the following components:      Result Value   Glucose, Bld 101 (*)    BUN <5 (*)    All other components within normal limits  CBC - Abnormal; Notable for the following components:   Hemoglobin 11.6 (*)    All other components within normal limits  I-STAT CHEM 8, ED - Abnormal; Notable for the following components:   BUN <3 (*)    Calcium, Ion 1.06 (*)    All other components within normal limits  BRAIN NATRIURETIC PEPTIDE  TROPONIN I (HIGH SENSITIVITY)  TROPONIN I (HIGH SENSITIVITY)    EKG None  Radiology CT Angio Chest Pulmonary Embolism (PE) W or WO Contrast  Result Date: 04/17/2023 CLINICAL DATA:  Chest pain.  Concern for pulmonary superior EXAM: CT ANGIOGRAPHY CHEST WITH CONTRAST TECHNIQUE: Multidetector CT imaging of the chest was performed using the standard protocol during bolus administration of intravenous contrast. Multiplanar CT image reconstructions and MIPs were obtained to evaluate the vascular anatomy. RADIATION DOSE REDUCTION: This exam was performed according to the departmental dose-optimization program which includes automated exposure control, adjustment of the mA and/or kV according to patient size and/or use of iterative reconstruction technique. CONTRAST:  75mL OMNIPAQUE IOHEXOL 350 MG/ML SOLN COMPARISON:  Chest CT dated 07/24/2022. FINDINGS: Evaluation is limited due to body habitus. Cardiovascular: Top-normal cardiac size. No pericardial effusion. The thoracic aorta is unremarkable. Evaluation of the pulmonary arteries is limited due to suboptimal opacification. No large or central pulmonary artery embolus identified. Mediastinum/Nodes: No hilar or mediastinal adenopathy. The esophagus is grossly unremarkable. No mediastinal fluid collection. Lungs/Pleura: Several scattered  ground-glass nodules measuring up to 1 cm in the superior segment of the left lobe of the liver most consistent with an infectious process, likely atypical etiology. Clinical correlation and follow-up recommended. No consolidative changes. There is no pleural effusion or pneumothorax. The central airways are patent. Upper Abdomen: No acute abnormality. Musculoskeletal: No acute osseous pathology. Review of the MIP images confirms the above findings. IMPRESSION: 1. No CT evidence of large or central pulmonary artery embolus. 2. Multilobar pneumonia, likely atypical in etiology. Clinical correlation is recommended. Electronically Signed   By: Elgie Collard M.D.   On: 04/17/2023 19:05   MR Brain Wo Contrast (neuro protocol)  Result Date: 04/17/2023 CLINICAL DATA:  Initial evaluation for neuro deficit, stroke suspected. EXAM: MRI HEAD WITHOUT CONTRAST TECHNIQUE: Multiplanar, multiecho pulse sequences of the brain and surrounding structures were obtained without intravenous contrast. COMPARISON:  None Available. FINDINGS: Brain: Examination degraded by motion artifact. Cerebral volume within normal limits. No focal parenchymal signal abnormality. No evidence for acute or subacute ischemia. Gray-white matter differentiation maintained. No areas of chronic cortical infarction. No acute or chronic intracranial blood product. No mass lesion, midline shift  or mass effect. No hydrocephalus or extra-axial fluid collection. Pituitary gland and suprasellar region within normal limits. Vascular: Major intracranial vascular flow voids are maintained. Skull and upper cervical spine: Craniocervical junction within normal limits. Diffuse loss of normal bone marrow signal, nonspecific but can be seen with anemia, smoking, obesity, and infiltrative/myelofibrotic marrow processes. No scalp soft tissue abnormality. Sinuses/Orbits: Globes and orbital soft tissues within normal limits. Paranasal sinuses are clear. No mastoid  effusion. Other: None. IMPRESSION: Normal brain MRI.  No acute intracranial abnormality. Electronically Signed   By: Rise Mu M.D.   On: 04/17/2023 18:59   DG Chest Port 1 View  Result Date: 04/17/2023 CLINICAL DATA:  Dyspnea EXAM: PORTABLE CHEST 1 VIEW COMPARISON:  11/22/2022 FINDINGS: Stable cardiomegaly. Pulmonary vascular congestion. Low lung volumes. Mild diffuse interstitial opacities. No pleural effusion or pneumothorax. IMPRESSION: Cardiomegaly with pulmonary vascular congestion and mild diffuse interstitial opacities, suggestive of pulmonary edema. Electronically Signed   By: Duanne Guess D.O.   On: 04/17/2023 16:36    Procedures Procedures    Medications Ordered in ED Medications  butalbital-acetaminophen-caffeine (FIORICET) 50-325-40 MG per tablet 1 tablet (has no administration in time range)  ipratropium-albuterol (DUONEB) 0.5-2.5 (3) MG/3ML nebulizer solution 3 mL (3 mLs Nebulization Given 04/17/23 1626)  methylPREDNISolone sodium succinate (SOLU-MEDROL) 125 mg/2 mL injection 125 mg (125 mg Intravenous Given 04/17/23 1650)  aspirin chewable tablet 324 mg (324 mg Oral Given 04/17/23 1650)  iohexol (OMNIPAQUE) 350 MG/ML injection 75 mL (75 mLs Intravenous Contrast Given 04/17/23 1847)  metoCLOPramide (REGLAN) injection 10 mg (10 mg Intravenous Given 04/17/23 2004)  diphenhydrAMINE (BENADRYL) injection 25 mg (25 mg Intravenous Given 04/17/23 2004)  ketorolac (TORADOL) 30 MG/ML injection 30 mg (30 mg Intravenous Given 04/17/23 2005)  ipratropium-albuterol (DUONEB) 0.5-2.5 (3) MG/3ML nebulizer solution 3 mL (3 mLs Nebulization Given 04/17/23 2012)    ED Course/ Medical Decision Making/ A&P                                 Medical Decision Making 32 year old female with asthma, HTN, CHF, T2DM, GERD, OSA on CPAP, obesity presenting for 2 weeks of pleuritic chest pain in the setting of antihypertensive nonadherence and no benefit with albuterol.  Presentation is concerning  for pulmonary embolism, acute on chronic CHF, pneumonia, acute asthma exacerbation, stroke, ACS.  She has been compliant with her BiPAP therefore less concern for obesity hypoventilation syndrome.  Lack of infectious symptoms not likely to be pneumonia.  Chest x-ray shows pulmonary vascular congestion concerning for CHF exacerbation.  However, last echo was 08/26/2022 with LVEF 60 to 65% with moderate LVH.  Of note this was before her recent hospitalization for CHF exacerbation.  Troponin 4, rules out ACS.  BNP normal.  MRI brain ruled out stroke, CTA chest without PE.  Her left-sided numbness spontaneously improved and additionally her breathing improved with DuoNebs.  Headache improved with migraine cocktail.  She is stable for discharge.  Workup reflects atypical chest pain and likely migraine complex.  Recommend restarting blood pressure medications, sent Rx to pharmacy for her home losartan and spironolactone for 1 month and advised follow-up with PCP.  Amount and/or Complexity of Data Reviewed Labs: ordered. Radiology: ordered.  Risk Prescription drug management.         Final Clinical Impression(s) / ED Diagnoses Final diagnoses:  Atypical chest pain  Acute nonintractable headache, unspecified headache type    Rx / DC Orders ED Discharge Orders  Ordered    losartan (COZAAR) 25 MG tablet  Daily        04/17/23 2131    spironolactone (ALDACTONE) 25 MG tablet  Daily        04/17/23 2131              Shelby Mattocks, DO 04/17/23 2135    Charlynne Pander, MD 04/22/23 [phone]

## 2023-04-20 DIAGNOSIS — F321 Major depressive disorder, single episode, moderate: Secondary | ICD-10-CM | POA: Diagnosis not present

## 2023-05-16 DIAGNOSIS — Z419 Encounter for procedure for purposes other than remedying health state, unspecified: Secondary | ICD-10-CM | POA: Diagnosis not present

## 2023-05-18 DIAGNOSIS — F321 Major depressive disorder, single episode, moderate: Secondary | ICD-10-CM | POA: Diagnosis not present

## 2023-05-25 DIAGNOSIS — F321 Major depressive disorder, single episode, moderate: Secondary | ICD-10-CM | POA: Diagnosis not present

## 2023-06-16 DIAGNOSIS — Z419 Encounter for procedure for purposes other than remedying health state, unspecified: Secondary | ICD-10-CM | POA: Diagnosis not present

## 2023-06-18 DIAGNOSIS — F321 Major depressive disorder, single episode, moderate: Secondary | ICD-10-CM | POA: Diagnosis not present

## 2023-06-29 DIAGNOSIS — F321 Major depressive disorder, single episode, moderate: Secondary | ICD-10-CM | POA: Diagnosis not present

## 2023-07-11 ENCOUNTER — Emergency Department (HOSPITAL_COMMUNITY): Payer: Medicaid Other

## 2023-07-11 ENCOUNTER — Encounter (HOSPITAL_COMMUNITY): Payer: Self-pay

## 2023-07-11 ENCOUNTER — Other Ambulatory Visit: Payer: Self-pay

## 2023-07-11 ENCOUNTER — Inpatient Hospital Stay (HOSPITAL_COMMUNITY)
Admission: EM | Admit: 2023-07-11 | Discharge: 2023-07-16 | DRG: 193 | Disposition: A | Discharge status: Home / Self Care | Payer: Medicaid Other | Attending: Family Medicine | Admitting: Family Medicine

## 2023-07-11 DIAGNOSIS — Z833 Family history of diabetes mellitus: Secondary | ICD-10-CM

## 2023-07-11 DIAGNOSIS — Z794 Long term (current) use of insulin: Secondary | ICD-10-CM

## 2023-07-11 DIAGNOSIS — Z7951 Long term (current) use of inhaled steroids: Secondary | ICD-10-CM | POA: Diagnosis not present

## 2023-07-11 DIAGNOSIS — Z1152 Encounter for screening for COVID-19: Secondary | ICD-10-CM | POA: Diagnosis not present

## 2023-07-11 DIAGNOSIS — J4541 Moderate persistent asthma with (acute) exacerbation: Secondary | ICD-10-CM | POA: Diagnosis not present

## 2023-07-11 DIAGNOSIS — J4520 Mild intermittent asthma, uncomplicated: Secondary | ICD-10-CM

## 2023-07-11 DIAGNOSIS — E785 Hyperlipidemia, unspecified: Secondary | ICD-10-CM | POA: Diagnosis present

## 2023-07-11 DIAGNOSIS — J189 Pneumonia, unspecified organism: Secondary | ICD-10-CM

## 2023-07-11 DIAGNOSIS — E119 Type 2 diabetes mellitus without complications: Secondary | ICD-10-CM

## 2023-07-11 DIAGNOSIS — R109 Unspecified abdominal pain: Secondary | ICD-10-CM | POA: Diagnosis not present

## 2023-07-11 DIAGNOSIS — E1165 Type 2 diabetes mellitus with hyperglycemia: Secondary | ICD-10-CM | POA: Diagnosis present

## 2023-07-11 DIAGNOSIS — Z79899 Other long term (current) drug therapy: Secondary | ICD-10-CM

## 2023-07-11 DIAGNOSIS — Z6841 Body Mass Index (BMI) 40.0 and over, adult: Secondary | ICD-10-CM

## 2023-07-11 DIAGNOSIS — R6510 Systemic inflammatory response syndrome (SIRS) of non-infectious origin without acute organ dysfunction: Secondary | ICD-10-CM

## 2023-07-11 DIAGNOSIS — Z91030 Bee allergy status: Secondary | ICD-10-CM | POA: Diagnosis not present

## 2023-07-11 DIAGNOSIS — Z7985 Long-term (current) use of injectable non-insulin antidiabetic drugs: Secondary | ICD-10-CM | POA: Diagnosis not present

## 2023-07-11 DIAGNOSIS — E876 Hypokalemia: Secondary | ICD-10-CM | POA: Diagnosis present

## 2023-07-11 DIAGNOSIS — Z87891 Personal history of nicotine dependence: Secondary | ICD-10-CM

## 2023-07-11 DIAGNOSIS — R918 Other nonspecific abnormal finding of lung field: Secondary | ICD-10-CM | POA: Diagnosis not present

## 2023-07-11 DIAGNOSIS — I5032 Chronic diastolic (congestive) heart failure: Secondary | ICD-10-CM | POA: Diagnosis present

## 2023-07-11 DIAGNOSIS — J9601 Acute respiratory failure with hypoxia: Principal | ICD-10-CM | POA: Diagnosis present

## 2023-07-11 DIAGNOSIS — I10 Essential (primary) hypertension: Secondary | ICD-10-CM | POA: Diagnosis not present

## 2023-07-11 DIAGNOSIS — Z8616 Personal history of COVID-19: Secondary | ICD-10-CM | POA: Diagnosis not present

## 2023-07-11 DIAGNOSIS — Z91013 Allergy to seafood: Secondary | ICD-10-CM

## 2023-07-11 DIAGNOSIS — K760 Fatty (change of) liver, not elsewhere classified: Secondary | ICD-10-CM | POA: Diagnosis present

## 2023-07-11 DIAGNOSIS — E662 Morbid (severe) obesity with alveolar hypoventilation: Secondary | ICD-10-CM | POA: Diagnosis present

## 2023-07-11 DIAGNOSIS — J4551 Severe persistent asthma with (acute) exacerbation: Secondary | ICD-10-CM | POA: Diagnosis present

## 2023-07-11 DIAGNOSIS — R059 Cough, unspecified: Secondary | ICD-10-CM | POA: Diagnosis not present

## 2023-07-11 DIAGNOSIS — R0989 Other specified symptoms and signs involving the circulatory and respiratory systems: Secondary | ICD-10-CM | POA: Diagnosis not present

## 2023-07-11 DIAGNOSIS — R0902 Hypoxemia: Secondary | ICD-10-CM | POA: Diagnosis not present

## 2023-07-11 DIAGNOSIS — J1000 Influenza due to other identified influenza virus with unspecified type of pneumonia: Principal | ICD-10-CM | POA: Diagnosis present

## 2023-07-11 DIAGNOSIS — J45901 Unspecified asthma with (acute) exacerbation: Secondary | ICD-10-CM | POA: Diagnosis present

## 2023-07-11 DIAGNOSIS — Z888 Allergy status to other drugs, medicaments and biological substances status: Secondary | ICD-10-CM

## 2023-07-11 DIAGNOSIS — R0602 Shortness of breath: Secondary | ICD-10-CM | POA: Diagnosis not present

## 2023-07-11 DIAGNOSIS — E66813 Obesity, class 3: Secondary | ICD-10-CM | POA: Diagnosis present

## 2023-07-11 DIAGNOSIS — G4733 Obstructive sleep apnea (adult) (pediatric): Secondary | ICD-10-CM | POA: Diagnosis present

## 2023-07-11 DIAGNOSIS — R079 Chest pain, unspecified: Secondary | ICD-10-CM | POA: Diagnosis not present

## 2023-07-11 DIAGNOSIS — J168 Pneumonia due to other specified infectious organisms: Secondary | ICD-10-CM | POA: Diagnosis not present

## 2023-07-11 DIAGNOSIS — I110 Hypertensive heart disease with heart failure: Secondary | ICD-10-CM | POA: Diagnosis present

## 2023-07-11 LAB — COMPREHENSIVE METABOLIC PANEL
ALT: 28 U/L (ref 0–44)
AST: 24 U/L (ref 15–41)
Albumin: 3.4 g/dL — ABNORMAL LOW (ref 3.5–5.0)
Alkaline Phosphatase: 69 U/L (ref 38–126)
Anion gap: 10 (ref 5–15)
BUN: 9 mg/dL (ref 6–20)
CO2: 25 mmol/L (ref 22–32)
Calcium: 9.4 mg/dL (ref 8.9–10.3)
Chloride: 100 mmol/L (ref 98–111)
Creatinine, Ser: 0.7 mg/dL (ref 0.44–1.00)
GFR, Estimated: >60 mL/min (ref >60)
Glucose, Bld: 177 mg/dL — ABNORMAL HIGH (ref 70–99)
Potassium: 4.1 mmol/L (ref 3.5–5.1)
Sodium: 135 mmol/L (ref 135–145)
Total Bilirubin: 0.2 mg/dL (ref 0.0–1.2)
Total Protein: 7.2 g/dL (ref 6.5–8.1)

## 2023-07-11 LAB — CBC
HCT: 40.7 % (ref 36.0–46.0)
Hemoglobin: 13 g/dL (ref 12.0–15.0)
MCH: 28.8 pg (ref 26.0–34.0)
MCHC: 31.9 g/dL (ref 30.0–36.0)
MCV: 90.2 fL (ref 80.0–100.0)
Platelets: 248 10*3/uL (ref 150–400)
RBC: 4.51 MIL/uL (ref 3.87–5.11)
RDW: 12.5 % (ref 11.5–15.5)
WBC: 5.6 10*3/uL (ref 4.0–10.5)
nRBC: 0 % (ref 0.0–0.2)

## 2023-07-11 LAB — RESP PANEL BY RT-PCR (RSV, FLU A&B, COVID)  RVPGX2
Influenza A by PCR: NEGATIVE
Influenza B by PCR: NEGATIVE
Resp Syncytial Virus by PCR: NEGATIVE
SARS Coronavirus 2 by RT PCR: NEGATIVE

## 2023-07-11 LAB — D-DIMER, QUANTITATIVE: D-Dimer, Quant: <0.27 ug{FEU}/mL (ref 0.00–0.50)

## 2023-07-11 LAB — TROPONIN I (HIGH SENSITIVITY)
Troponin I (High Sensitivity): 5 ng/L (ref <18)
Troponin I (High Sensitivity): 3 ng/L (ref <18)

## 2023-07-11 LAB — BRAIN NATRIURETIC PEPTIDE: B Natriuretic Peptide: 11.9 pg/mL (ref 0.0–100.0)

## 2023-07-11 LAB — HCG, SERUM, QUALITATIVE: Preg, Serum: NEGATIVE

## 2023-07-11 LAB — I-STAT CG4 LACTIC ACID, ED: Lactic Acid, Venous: 1 mmol/L (ref 0.5–1.9)

## 2023-07-11 LAB — CBG MONITORING, ED: Glucose-Capillary: 246 mg/dL — ABNORMAL HIGH (ref 70–99)

## 2023-07-11 MED ORDER — METHYLPREDNISOLONE SODIUM SUCC 125 MG IJ SOLR
125.0000 mg | Freq: Once | INTRAMUSCULAR | Status: AC
  Administered 2023-07-11: 125 mg via INTRAVENOUS
  Filled 2023-07-11: qty 2

## 2023-07-11 MED ORDER — DIPHENHYDRAMINE HCL 50 MG/ML IJ SOLN
12.5000 mg | Freq: Once | INTRAMUSCULAR | Status: AC
  Administered 2023-07-11: 12.5 mg via INTRAVENOUS
  Filled 2023-07-11: qty 1

## 2023-07-11 MED ORDER — METHYLPREDNISOLONE SODIUM SUCC 40 MG IJ SOLR
40.0000 mg | Freq: Every day | INTRAMUSCULAR | Status: DC
  Administered 2023-07-11: 40 mg via INTRAVENOUS
  Filled 2023-07-11: qty 1

## 2023-07-11 MED ORDER — PROCHLORPERAZINE EDISYLATE 10 MG/2ML IJ SOLN
10.0000 mg | Freq: Once | INTRAMUSCULAR | Status: AC
  Administered 2023-07-11: 10 mg via INTRAVENOUS
  Filled 2023-07-11: qty 2

## 2023-07-11 MED ORDER — ENOXAPARIN SODIUM 80 MG/0.8ML IJ SOSY
80.0000 mg | PREFILLED_SYRINGE | INTRAMUSCULAR | Status: DC
Start: 2023-07-11 — End: 2023-07-16
  Administered 2023-07-12 – 2023-07-15 (×4): 80 mg via SUBCUTANEOUS
  Filled 2023-07-11 (×6): qty 0.8

## 2023-07-11 MED ORDER — INSULIN ASPART 100 UNIT/ML IJ SOLN
0.0000 [IU] | Freq: Three times a day (TID) | INTRAMUSCULAR | Status: DC
  Administered 2023-07-12: 7 [IU] via SUBCUTANEOUS

## 2023-07-11 MED ORDER — ROSUVASTATIN CALCIUM 20 MG PO TABS
20.0000 mg | ORAL_TABLET | Freq: Every day | ORAL | Status: DC
  Administered 2023-07-12 – 2023-07-16 (×5): 20 mg via ORAL
  Filled 2023-07-11 (×6): qty 1

## 2023-07-11 MED ORDER — PROCHLORPERAZINE EDISYLATE 10 MG/2ML IJ SOLN
5.0000 mg | Freq: Four times a day (QID) | INTRAMUSCULAR | Status: DC | PRN
  Administered 2023-07-13: 5 mg via INTRAVENOUS
  Filled 2023-07-11: qty 2

## 2023-07-11 MED ORDER — MOMETASONE FURO-FORMOTEROL FUM 200-5 MCG/ACT IN AERO
2.0000 | INHALATION_SPRAY | Freq: Two times a day (BID) | RESPIRATORY_TRACT | Status: DC
  Administered 2023-07-12 – 2023-07-16 (×6): 2 via RESPIRATORY_TRACT
  Filled 2023-07-11 (×3): qty 8.8

## 2023-07-11 MED ORDER — MELATONIN 5 MG PO TABS: 5.0000 mg | ORAL_TABLET | Freq: Every evening | ORAL | Status: DC | PRN

## 2023-07-11 MED ORDER — POLYETHYLENE GLYCOL 3350 17 G PO PACK: 17.0000 g | PACK | Freq: Every day | ORAL | Status: DC | PRN

## 2023-07-11 MED ORDER — IPRATROPIUM-ALBUTEROL 0.5-2.5 (3) MG/3ML IN SOLN
3.0000 mL | Freq: Once | RESPIRATORY_TRACT | Status: DC
  Filled 2023-07-11: qty 3

## 2023-07-11 MED ORDER — SODIUM CHLORIDE 0.9 % IV BOLUS
1000.0000 mL | Freq: Once | INTRAVENOUS | Status: AC
  Administered 2023-07-11: 1000 mL via INTRAVENOUS

## 2023-07-11 MED ORDER — MAGNESIUM SULFATE 2 GM/50ML IV SOLN
2.0000 g | Freq: Once | INTRAVENOUS | Status: DC
  Filled 2023-07-11: qty 50

## 2023-07-11 MED ORDER — SODIUM CHLORIDE 0.9 % IV SOLN
2.0000 g | INTRAVENOUS | Status: AC
  Administered 2023-07-11 – 2023-07-14 (×4): 2 g via INTRAVENOUS
  Filled 2023-07-11 (×4): qty 20

## 2023-07-11 MED ORDER — SODIUM CHLORIDE 0.9 % IV SOLN
500.0000 mg | INTRAVENOUS | Status: DC
  Administered 2023-07-11: 500 mg via INTRAVENOUS
  Filled 2023-07-11: qty 5

## 2023-07-11 MED ORDER — SODIUM CHLORIDE 0.9 % IV SOLN
1.0000 g | Freq: Once | INTRAVENOUS | Status: DC
  Filled 2023-07-11: qty 10

## 2023-07-11 MED ORDER — ACETAMINOPHEN 325 MG PO TABS
650.0000 mg | ORAL_TABLET | Freq: Four times a day (QID) | ORAL | Status: DC | PRN
  Administered 2023-07-12 – 2023-07-13 (×2): 650 mg via ORAL
  Filled 2023-07-11 (×2): qty 2

## 2023-07-11 MED ORDER — METOCLOPRAMIDE HCL 5 MG/ML IJ SOLN
5.0000 mg | INTRAMUSCULAR | Status: AC
  Administered 2023-07-11: 5 mg via INTRAVENOUS
  Filled 2023-07-11: qty 2

## 2023-07-11 MED ORDER — IPRATROPIUM-ALBUTEROL 0.5-2.5 (3) MG/3ML IN SOLN
3.0000 mL | Freq: Once | RESPIRATORY_TRACT | Status: AC
  Administered 2023-07-11: 3 mL via RESPIRATORY_TRACT
  Filled 2023-07-11: qty 3

## 2023-07-11 MED ORDER — IPRATROPIUM-ALBUTEROL 0.5-2.5 (3) MG/3ML IN SOLN
3.0000 mL | Freq: Four times a day (QID) | RESPIRATORY_TRACT | Status: DC
  Administered 2023-07-11 – 2023-07-14 (×9): 3 mL via RESPIRATORY_TRACT
  Filled 2023-07-11 (×11): qty 3

## 2023-07-11 MED ORDER — GUAIFENESIN-DM 100-10 MG/5ML PO SYRP
5.0000 mL | ORAL_SOLUTION | ORAL | Status: DC | PRN
  Administered 2023-07-13 – 2023-07-15 (×7): 5 mL via ORAL
  Filled 2023-07-11 (×7): qty 5

## 2023-07-11 MED ORDER — INSULIN ASPART 100 UNIT/ML IJ SOLN
0.0000 [IU] | Freq: Every day | INTRAMUSCULAR | Status: DC
  Administered 2023-07-11: 2 [IU] via SUBCUTANEOUS

## 2023-07-11 MED ORDER — SPIRONOLACTONE 25 MG PO TABS
25.0000 mg | ORAL_TABLET | Freq: Every day | ORAL | Status: DC
  Administered 2023-07-11 – 2023-07-16 (×6): 25 mg via ORAL
  Filled 2023-07-11 (×2): qty 1
  Filled 2023-07-11: qty 2
  Filled 2023-07-11 (×2): qty 1
  Filled 2023-07-11: qty 2

## 2023-07-11 MED ORDER — IOHEXOL 350 MG/ML SOLN
100.0000 mL | Freq: Once | INTRAVENOUS | Status: AC | PRN
  Administered 2023-07-11: 100 mL via INTRAVENOUS

## 2023-07-11 MED ORDER — SODIUM CHLORIDE 0.9 % IV SOLN
500.0000 mg | Freq: Once | INTRAVENOUS | Status: DC
  Filled 2023-07-11: qty 5

## 2023-07-11 MED ORDER — KETOROLAC TROMETHAMINE 15 MG/ML IJ SOLN
15.0000 mg | INTRAMUSCULAR | Status: AC
  Administered 2023-07-11: 15 mg via INTRAVENOUS
  Filled 2023-07-11: qty 1

## 2023-07-11 MED ORDER — DIPHENHYDRAMINE HCL 50 MG/ML IJ SOLN
12.5000 mg | INTRAMUSCULAR | Status: AC
  Administered 2023-07-11: 12.5 mg via INTRAVENOUS
  Filled 2023-07-11: qty 1

## 2023-07-11 NOTE — ED Notes (Signed)
Patient transported to CT

## 2023-07-11 NOTE — ED Provider Triage Note (Signed)
Emergency Medicine Provider Triage Evaluation Note  Yvette Gibbs , a 33 y.o. female  was evaluated in triage.  Pt complains of chest discomfort, shortness of breath.  Shortness of breath has been worsening over the last 2 weeks.  She indicates that she has history of asthma and also CHF.  Also indicates that she feels the same as she did when she had COVID and flu in the  Review of Systems  Positive: Shortness of breath, chest pain Negative: Dizziness, near fainting  Physical Exam  BP 136/88 (BP Location: Right Wrist)   Pulse (!) 113   Temp 98.2 F (36.8 C) (Oral)   Resp (!) 22   Ht 5\' 2"  (1.575 m)   Wt (!) 174.8 kg   SpO2 96%   BMI 70.48 kg/m  Gen:   Awake, no distress   Resp:  Normal effort  MSK:   Moves extremities without difficulty  Other:  Slight wheezing noted  Medical Decision Making  Medically screening exam initiated at 12:18 PM.  Appropriate orders placed.  Maddilynn Schillaci was informed that the remainder of the evaluation will be completed by another provider, this initial triage assessment does not replace that evaluation, and the importance of remaining in the ED until their evaluation is complete.  Recent CT PE in November was negative for PE.  We will get basic blood work and reassess.   Derwood Kaplan, MD 07/11/23 [phone]

## 2023-07-11 NOTE — ED Notes (Signed)
Pt refusing neb tx and mag at this time. Pt stating she is hungry and wanting to leave. Provider made aware and pt offered AMA. Pt states she will stay for CT but does not want meds offered.

## 2023-07-11 NOTE — H&P (Signed)
History and Physical  Yvette Gibbs WUJ:[id number] DOB: 11/28/1990 DOA: 07/11/2023  Referring physician: Ralph Leyden, PA_EDP  PCP: Grayce Sessions, NP  Outpatient Specialists: Cardiology Patient coming from: Home  Chief Complaint: Shortness of breath   HPI: Yvette Gibbs is a 33 y.o. female with medical history significant for severe morbid obesity BMI 70, OSA on BiPAP, asthma, DM2, HTN, chronic HFpEF, chronic depression, GERD who presents to ER via EMS from home due to complaints of shortness of breath, progressively worsening for the past 2 weeks.  Associated with a nonproductive cough and diffuse wheezing.  Early on in her illness she had nausea vomiting and diarrhea which have now resolved.  Cough and audible wheezing persist.  She received bronchodilators en route via EMS.  In the ER, tachycardic and tachypneic with O2 saturation in the 80s.  Not on oxygen supplementation at baseline.  The patient received IV Solu-Medrol, and multiple rounds of nebulizer treatment.  CTA PE was negative for acute pulmonary embolism however showed subsegmental mucous plugging in the left lower lobe and interval development of new patchy groundglass opacity in the left lower lobe concerning for pneumonia.  The patient was started on empiric IV antibiotics in the ER, Rocephin and IV azithromycin.  [facility], hospitalist service, was asked to admit.  ED Course: Temperature 98.9.  BP 141/96, pulse 113, respiratory 20, saturation of 100% on 2 L nasal cannula.  CBC unremarkable.  CMP notable for elevated glucose 177.  Lactic acid 1.0, troponin 5, 3.  BNP 11.9.  Review of Systems: Review of systems as noted in the HPI. All other systems reviewed and are negative.   Past Medical History:  Diagnosis Date   Acute exacerbation of CHF (congestive heart failure) (HCC) 05/04/2021   Asthma    Depression    Diabetes mellitus type 2, uncontrolled    Extreme obesity    Family history of adverse reaction to  anesthesia    " my grandmother had an allergic reaction   Hypertension    Mental disorder    Obesity    OSA (obstructive sleep apnea)    Past Surgical History:  Procedure Laterality Date   APPENDECTOMY     CHOLECYSTECTOMY N/A 09/05/2014   Procedure: LAPAROSCOPIC CHOLECYSTECTOMY;  Surgeon: Axel Filler, MD;  Location: [facility] OR;  Service: General;  Laterality: N/A;   TONSILLECTOMY      Social History:  reports that she quit smoking about 5 years ago. Her smoking use included cigarettes. She started smoking about 15 years ago. She has a 5 pack-year smoking history. She has never used smokeless tobacco. She reports that she does not currently use alcohol. She reports that she does not currently use drugs after having used the following drugs: Marijuana.   Allergies  Allergen Reactions   Bee Pollen Other (See Comments)    Sneezing and eye burning   Fish Allergy Itching and Rash   Metformin And Related Itching and Rash   Shellfish Allergy Itching and Rash   Zestril [Lisinopril] Rash and Other (See Comments)    Diaphoresis     Family History  Problem Relation Age of Onset   Diabetes Mellitus II Maternal Grandmother       Prior to Admission medications   Medication Sig Start Date End Date Taking? Authorizing Provider  Accu-Chek Softclix Lancets lancets Use to check blood sugar 3 times daily. 12/11/22   Hoy Register, MD  acetaminophen (TYLENOL) 325 MG tablet Take 2 tablets (650 mg total) by mouth every 6 (six) hours  as needed for mild pain or headache. 11/27/22   Arrien, York Ram, MD  albuterol (PROVENTIL) (2.5 MG/3ML) 0.083% nebulizer solution Use 1 vial (2.5 mg total) by nebulization every 4 (four) hours as needed for wheezing or shortness of breath. Do not use in addition to albuterol rescue inhaler 12/11/22 01/10/23  Hoy Register, MD  albuterol (VENTOLIN HFA) 108 (90 Base) MCG/ACT inhaler Inhale 2 puffs into the lungs every 6 (six) hours as needed for wheezing or shortness  of breath (Do not use in addition to albuterol nebulizer). 08/27/22   Narda Bonds, MD  Blood Glucose Monitoring Suppl (ACCU-CHEK GUIDE) w/Device KIT Use to check blood sugar 3 times daily. 12/11/22   Hoy Register, MD  budesonide-formoterol (SYMBICORT) 160-4.5 MCG/ACT inhaler Inhale 2 puffs into the lungs 2 (two) times daily. 12/11/22 01/10/23  Hoy Register, MD  cyclobenzaprine (FLEXERIL) 5 MG tablet Take 1 tablet (5 mg total) by mouth 3 (three) times daily as needed for muscle spasms. 12/11/22   Hoy Register, MD  GLOBAL EASE INJECT PEN NEEDLES 32G X 4 MM MISC USE TO INJECT lantus EVERY DAY AS DIRECTED 07/03/22   Grayce Sessions, NP  glucose blood (ACCU-CHEK GUIDE) test strip Use to check blood sugar 3 times daily. 12/11/22   Hoy Register, MD  insulin glargine (LANTUS SOLOSTAR) 100 UNIT/ML Solostar Pen Inject 25 Units into the skin daily. 12/11/22   Hoy Register, MD  losartan (COZAAR) 25 MG tablet Take 1 tablet (25 mg total) by mouth daily. 04/17/23   Shelby Mattocks, DO  rosuvastatin (CRESTOR) 20 MG tablet Take 1 tablet (20 mg total) by mouth daily. 12/11/22   Hoy Register, MD  Semaglutide,0.25 or 0.5MG /DOS, (OZEMPIC, 0.25 OR 0.5 MG/DOSE,) 2 MG/3ML SOPN Inject 0.25 mg into the skin once a week. For 4 weeks. Then, increase to 0.5 mg into the skin once a week thereafter. 12/11/22   Hoy Register, MD  spironolactone (ALDACTONE) 25 MG tablet Take 1 tablet (25 mg total) by mouth daily. 04/17/23 04/16/24  Shelby Mattocks, DO    Physical Exam: BP (!) 158/93   Pulse 96   Temp 98.9 F (37.2 C) (Oral)   Resp (!) 27   Ht 5\' 2"  (1.575 m)   Wt (!) 174.8 kg   SpO2 93%   BMI 70.48 kg/m   General: 33 y.o. year-old female well developed well nourished in no acute distress.  Alert and oriented x3. Cardiovascular: Regular rate and rhythm with no rubs or gallops.  No thyromegaly or JVD noted.  Trace lower extremity edema bilaterally. Respiratory: Mild rales at bases.  Mild diffuse wheezing. Good  inspiratory effort. Abdomen: Soft nontender nondistended with normal bowel sounds x4 quadrants. Muskuloskeletal: No cyanosis or clubbing noted bilaterally Neuro: CN II-XII intact, strength, sensation, reflexes Skin: No ulcerative lesions noted or rashes Psychiatry: Judgement and insight appear normal. Mood is appropriate for condition and setting          Labs on Admission:  Basic Metabolic Panel: Recent Labs  Lab 07/11/23 1203  NA 135  K 4.1  CL 100  CO2 25  GLUCOSE 177*  BUN 9  CREATININE 0.70  CALCIUM 9.4   Liver Function Tests: Recent Labs  Lab 07/11/23 1203  AST 24  ALT 28  ALKPHOS 69  BILITOT 0.2  PROT 7.2  ALBUMIN 3.4*   No results for input(s): "LIPASE", "AMYLASE" in the last 168 hours. No results for input(s): "AMMONIA" in the last 168 hours. CBC: Recent Labs  Lab 07/11/23 1203  WBC 5.6  HGB 13.0  HCT 40.7  MCV 90.2  PLT 248   Cardiac Enzymes: No results for input(s): "CKTOTAL", "CKMB", "CKMBINDEX", "TROPONINI" in the last 168 hours.  BNP (last 3 results) Recent Labs    11/22/22 2202 04/17/23 1652 07/11/23 1607  BNP 18.0 39.1 11.9    ProBNP (last 3 results) No results for input(s): "PROBNP" in the last 8760 hours.  CBG: No results for input(s): "GLUCAP" in the last 168 hours.  Radiological Exams on Admission: CT Angio Chest PE W and/or Wo Contrast Result Date: 07/11/2023 CLINICAL DATA:  Two-week history of worsening shortness of breath associated with chest pain and cough. Abdominal pain and emesis EXAM: CT ANGIOGRAPHY CHEST CT ABDOMEN AND PELVIS WITH CONTRAST TECHNIQUE: Multidetector CT imaging of the chest was performed using the standard protocol during bolus administration of intravenous contrast. Multiplanar CT image reconstructions and MIPs were obtained to evaluate the vascular anatomy. Multidetector CT imaging of the abdomen and pelvis was performed using the standard protocol during bolus administration of intravenous contrast.  RADIATION DOSE REDUCTION: This exam was performed according to the departmental dose-optimization program which includes automated exposure control, adjustment of the mA and/or kV according to patient size and/or use of iterative reconstruction technique. CONTRAST:  OMNIPAQUE IOHEXOL 350 MG/ML SOLN COMPARISON:  CT chest dated 04/17/2023, CT abdomen and pelvis dated 06/10/2022 FINDINGS: CTA CHEST FINDINGS Cardiovascular: The study is adequate for the evaluation of pulmonary embolism to the level of the proximal segmental arteries. Decreased sensitivity and specificity for detailed findings due to photon starvation and contrast bolus timing. There are no filling defects in the central, lobar, or proximal segmental pulmonary artery branches to suggest acute pulmonary embolism. Mild dilation of main pulmonary artery measuring 3.4 cm. Normal heart size. No significant pericardial fluid/thickening. Mediastinum/Nodes: Imaged thyroid gland without nodules meeting criteria for imaging follow-up by size. Normal esophagus. No pathologically enlarged axillary, supraclavicular, mediastinal, or hilar lymph nodes. Lungs/Pleura: The central airways are patent. AP effacement of the trachea and bilateral bronchi. Subsegmental mucous plugging in the left lower lobe. Previously noted left lower lobe ground-glass nodules are no longer seen. Interval development of new patchy ground-glass opacities in the left lower lobe. Background of diffuse mosaic attenuation. No pneumothorax. No pleural effusion. Musculoskeletal: No acute or abnormal lytic or blastic osseous lesions. Review of the MIP images confirms the above findings. CT ABDOMEN and PELVIS FINDINGS Hepatobiliary: No focal hepatic lesions. No intra or extrahepatic biliary ductal dilation. Normal gallbladder. Pancreas: No focal lesions or main ductal dilation. Spleen: Normal in size without focal abnormality. Adrenals/Urinary Tract: No adrenal nodules. No suspicious renal mass  or hydronephrosis. Punctate nonobstructing right interpolar stone. No focal bladder wall thickening. Stomach/Bowel: Normal appearance of the stomach. No evidence of bowel wall thickening, distention, or inflammatory changes. Appendectomy. Vascular/Lymphatic: No significant vascular findings are present. No enlarged abdominal or pelvic lymph nodes. Reproductive: No adnexal masses. Other: No free fluid, fluid collection, or free air. Musculoskeletal: No acute or abnormal lytic or blastic osseous lesions. 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 IMPRESSION: 1. No evidence of pulmonary embolism to the level of the proximal segmental arteries. Vessels distal to this level are suboptimally evaluated due to photon starvation artifact and contrast bolus timing. 2. 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 3. Subsegmental mucous plugging in the left lower lobe and interval development of new patchy ground-glass opacities in the left lower lobe, likely infectious/inflammatory. 4. Background of diffuse mosaic attenuation, which  can be seen in the setting of small airways disease. 5. Mild dilation of main pulmonary artery measuring 3.4 cm, which can be seen in the setting of pulmonary hypertension. 6. No acute abdominopelvic findings. Electronically Signed   By: Agustin Cree M.D.   On: 07/11/2023 19:39   CT ABDOMEN PELVIS W CONTRAST Result Date: 07/11/2023 CLINICAL DATA:  Two-week history of worsening shortness of breath associated with chest pain and cough. Abdominal pain and emesis EXAM: CT ANGIOGRAPHY CHEST CT ABDOMEN AND PELVIS WITH CONTRAST TECHNIQUE: Multidetector CT imaging of the chest was performed using the standard protocol during bolus administration of intravenous contrast. Multiplanar CT image reconstructions and MIPs were obtained to evaluate the vascular anatomy. Multidetector CT imaging of the abdomen and pelvis was performed using the  standard protocol during bolus administration of intravenous contrast. RADIATION DOSE REDUCTION: This exam was performed according to the departmental dose-optimization program which includes automated exposure control, adjustment of the mA and/or kV according to patient size and/or use of iterative reconstruction technique. CONTRAST:  OMNIPAQUE IOHEXOL 350 MG/ML SOLN COMPARISON:  CT chest dated 04/17/2023, CT abdomen and pelvis dated 06/10/2022 FINDINGS: CTA CHEST FINDINGS Cardiovascular: The study is adequate for the evaluation of pulmonary embolism to the level of the proximal segmental arteries. Decreased sensitivity and specificity for detailed findings due to photon starvation and contrast bolus timing. There are no filling defects in the central, lobar, or proximal segmental pulmonary artery branches to suggest acute pulmonary embolism. Mild dilation of main pulmonary artery measuring 3.4 cm. Normal heart size. No significant pericardial fluid/thickening. Mediastinum/Nodes: Imaged thyroid gland without nodules meeting criteria for imaging follow-up by size. Normal esophagus. No pathologically enlarged axillary, supraclavicular, mediastinal, or hilar lymph nodes. Lungs/Pleura: The central airways are patent. AP effacement of the trachea and bilateral bronchi. Subsegmental mucous plugging in the left lower lobe. Previously noted left lower lobe ground-glass nodules are no longer seen. Interval development of new patchy ground-glass opacities in the left lower lobe. Background of diffuse mosaic attenuation. No pneumothorax. No pleural effusion. Musculoskeletal: No acute or abnormal lytic or blastic osseous lesions. Review of the MIP images confirms the above findings. CT ABDOMEN and PELVIS FINDINGS Hepatobiliary: No focal hepatic lesions. No intra or extrahepatic biliary ductal dilation. Normal gallbladder. Pancreas: No focal lesions or main ductal dilation. Spleen: Normal in size without focal  abnormality. Adrenals/Urinary Tract: No adrenal nodules. No suspicious renal mass or hydronephrosis. Punctate nonobstructing right interpolar stone. No focal bladder wall thickening. Stomach/Bowel: Normal appearance of the stomach. No evidence of bowel wall thickening, distention, or inflammatory changes. Appendectomy. Vascular/Lymphatic: No significant vascular findings are present. No enlarged abdominal or pelvic lymph nodes. Reproductive: No adnexal masses. Other: No free fluid, fluid collection, or free air. Musculoskeletal: No acute or abnormal lytic or blastic osseous lesions. 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 IMPRESSION: 1. No evidence of pulmonary embolism to the level of the proximal segmental arteries. Vessels distal to this level are suboptimally evaluated due to photon starvation artifact and contrast bolus timing. 2. 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 3. Subsegmental mucous plugging in the left lower lobe and interval development of new patchy ground-glass opacities in the left lower lobe, likely infectious/inflammatory. 4. Background of diffuse mosaic attenuation, which can be seen in the setting of small airways disease. 5. Mild dilation of main pulmonary artery measuring 3.4 cm, which can be seen in the setting of pulmonary hypertension. 6. No acute abdominopelvic findings. Electronically Signed  By: Agustin Cree M.D.   On: 07/11/2023 19:39   DG Chest 2 View Result Date: 07/11/2023 CLINICAL DATA:  Two-week history of chest pain and congestion EXAM: CHEST - 2 VIEW COMPARISON:  Chest radiograph dated 04/17/2023 FINDINGS: Low lung volumes with bronchovascular crowding. Asymmetric peripheral right lower lung hazy and patchy opacity. No pleural effusion or pneumothorax. The heart size and mediastinal contours are within normal limits. No acute osseous abnormality. IMPRESSION: 1. Asymmetric peripheral right lower lung hazy  and patchy opacity, which may represent atelectasis or pneumonia. 2.  Low lung volumes with bronchovascular crowding. Electronically Signed   By: Agustin Cree M.D.   On: 07/11/2023 13:18    EKG: I independently viewed the EKG done and my findings are as followed: Sinus tachycardia rate of 111.  Nonspecific ST-T changes.  QTc 496.  Assessment/Plan Present on Admission:  Acute hypoxic respiratory failure (HCC)  Principal Problem:   Acute hypoxic respiratory failure (HCC)  Acute hypoxic respiratory failure secondary to acute asthma exacerbation versus left lower lobe community-acquired pneumonia, POA Not on oxygen supplementation at baseline Continue to treat underlying conditions Maintain O2 saturation above 90%  OSA and suspected OHS, on BiPAP nightly Chronic hypercarbia in the setting of OSA and likely OHS Self reports on BiPAP nightly, resume Obtain baseline pH and pCO2 via VBG Keep n.p.o. when on BiPAP, care order added for nursing. Strict n.p.o. while on BiPAP to avoid aspiration.  This patient wears BiPAP nightly at home due to hypercarbia in the setting of OSA and likely due to obesity hypoventilation syndrome.  Acute asthma exacerbation Possible left lower lobe community-acquired pneumonia Obtain baseline procalcitonin Continue Rocephin and azithromycin initiated in the ED Continue IV Solu-Medrol Continue bronchodilators, as needed antitussives, and pulmonary toilet Resume home asthma medications Early mobilization  Type 2 diabetes with hyperglycemia Last hemoglobin A1c 8.7 on 11/23/2022 Heart healthy carb modified diet Start insulin sliding scale  Chronic HFpEF Last 2D echo done on 08/26/2022 revealed LVEF 60-65%, moderate left ventricular hypertrophy. Resume home regimen Monitor strict I's and O's and daily weight  Hyperlipidemia Resume home Crestor  Severe morbid obesity BMI 70 Recommend weight loss outpatient with regular physical activity and healthy  dieting.   Critical care time: 65 minutes.   DVT prophylaxis: Subcu Lovenox daily.  Code Status: Full code.  Family Communication: None at bedside.  Disposition Plan: Admitted to progressive care unit.  Consults called: None.  Admission status: Inpatient status.   Status is: Inpatient The patient requires at least 2 midnights for further evaluation and treatment of present condition.   Darlin Drop MD [facility] Pager [phone]  If 7PM-7AM, please contact night-coverage [email or web address] Password [facility]  07/11/2023, 8:32 PM

## 2023-07-11 NOTE — ED Triage Notes (Signed)
Pt arrived via [facility] from home for c/o [id number] and getting worse. Pt c/o pain across entire chest w/palpation and coughing. Per EMS, pt has wheezes in lower lobes. EMS gave albuterol 2.5 mg and atrovent 0.5mg .

## 2023-07-11 NOTE — ED Provider Notes (Signed)
[place] EMERGENCY DEPARTMENT AT [facility] Provider Note   CSN: [id number] Arrival date & time: 07/11/23  1130    History  Chief Complaint  Patient presents with   Shortness of Breath    Yvette Gibbs is a 33 y.o. female extensive past medical history here for evaluation of multiple complaints.  Patient has had cough, shortness of breath over the last 2 weeks.  Feels like it is getting worse.  She has pain across her entire chest.  She is coughing of yellow sputum.  States she had a fever of 103 this morning.  Took ibuprofen around 2 AM.  She also notes 4 days ago she had nausea, vomiting and diarrhea.  She had 2 episodes of hematemesis 2 days ago, no further episodes since.  States she hurts all over.  No history of PE or DVT.  She has been using her albuterol inhaler at home without relief.  History of CHF, not on Lasix.  No sick contacts. No blood in stool  HPI     Home Medications Prior to Admission medications   Medication Sig Start Date End Date Taking? Authorizing Provider  Accu-Chek Softclix Lancets lancets Use to check blood sugar 3 times daily. 12/11/22   Hoy Register, MD  acetaminophen (TYLENOL) 325 MG tablet Take 2 tablets (650 mg total) by mouth every 6 (six) hours as needed for mild pain or headache. 11/27/22   Arrien, York Ram, MD  albuterol (PROVENTIL) (2.5 MG/3ML) 0.083% nebulizer solution Use 1 vial (2.5 mg total) by nebulization every 4 (four) hours as needed for wheezing or shortness of breath. Do not use in addition to albuterol rescue inhaler 12/11/22 01/10/23  Hoy Register, MD  albuterol (VENTOLIN HFA) 108 (90 Base) MCG/ACT inhaler Inhale 2 puffs into the lungs every 6 (six) hours as needed for wheezing or shortness of breath (Do not use in addition to albuterol nebulizer). 08/27/22   Narda Bonds, MD  Blood Glucose Monitoring Suppl (ACCU-CHEK GUIDE) w/Device KIT Use to check blood sugar 3 times daily. 12/11/22   Hoy Register, MD   budesonide-formoterol (SYMBICORT) 160-4.5 MCG/ACT inhaler Inhale 2 puffs into the lungs 2 (two) times daily. 12/11/22 01/10/23  Hoy Register, MD  cyclobenzaprine (FLEXERIL) 5 MG tablet Take 1 tablet (5 mg total) by mouth 3 (three) times daily as needed for muscle spasms. 12/11/22   Hoy Register, MD  GLOBAL EASE INJECT PEN NEEDLES 32G X 4 MM MISC USE TO INJECT lantus EVERY DAY AS DIRECTED 07/03/22   Grayce Sessions, NP  glucose blood (ACCU-CHEK GUIDE) test strip Use to check blood sugar 3 times daily. 12/11/22   Hoy Register, MD  insulin glargine (LANTUS SOLOSTAR) 100 UNIT/ML Solostar Pen Inject 25 Units into the skin daily. 12/11/22   Hoy Register, MD  losartan (COZAAR) 25 MG tablet Take 1 tablet (25 mg total) by mouth daily. 04/17/23   Shelby Mattocks, DO  rosuvastatin (CRESTOR) 20 MG tablet Take 1 tablet (20 mg total) by mouth daily. 12/11/22   Hoy Register, MD  Semaglutide,0.25 or 0.5MG /DOS, (OZEMPIC, 0.25 OR 0.5 MG/DOSE,) 2 MG/3ML SOPN Inject 0.25 mg into the skin once a week. For 4 weeks. Then, increase to 0.5 mg into the skin once a week thereafter. 12/11/22   Hoy Register, MD  spironolactone (ALDACTONE) 25 MG tablet Take 1 tablet (25 mg total) by mouth daily. 04/17/23 04/16/24  Shelby Mattocks, DO      Allergies    Bee pollen, Fish allergy, Metformin and related, Shellfish  allergy, and Zestril [lisinopril]    Review of Systems   Review of Systems  Constitutional:  Positive for activity change, appetite change and fever.  Respiratory:  Positive for cough, chest tightness, shortness of breath and wheezing.   Cardiovascular:  Positive for chest pain. Negative for leg swelling.  Gastrointestinal:  Positive for abdominal pain, diarrhea, nausea and vomiting.  Genitourinary: Negative.   Musculoskeletal:  Positive for myalgias.  Neurological:  Positive for weakness (generalized).  All other systems reviewed and are negative.   Physical Exam Updated Vital Signs BP (!) 158/93    Pulse 96   Temp 98.9 F (37.2 C) (Oral)   Resp (!) 27   Ht 5\' 2"  (1.575 m)   Wt (!) 174.8 kg   SpO2 93%   BMI 70.48 kg/m  Physical Exam Vitals and nursing note reviewed.  Constitutional:      General: She is not in acute distress.    Appearance: She is well-developed. She is obese. She is ill-appearing. She is not toxic-appearing or diaphoretic.  HENT:     Head: Atraumatic.  Eyes:     Pupils: Pupils are equal, round, and reactive to light.  Cardiovascular:     Rate and Rhythm: Tachycardia present.     Pulses: Normal pulses.          Radial pulses are 2+ on the right side and 2+ on the left side.       Dorsalis pedis pulses are 2+ on the right side and 2+ on the left side.     Heart sounds: Normal heart sounds.  Pulmonary:     Effort: Tachypnea present. No respiratory distress.     Breath sounds: Wheezing present.  Chest:     Chest wall: Tenderness present. No mass or crepitus.  Abdominal:     General: Bowel sounds are normal. There is no distension.     Palpations: There is no mass.     Tenderness: There is no abdominal tenderness. There is no guarding or rebound.  Musculoskeletal:        General: Normal range of motion.     Cervical back: Normal range of motion.     Right lower leg: No tenderness. No edema.     Left lower leg: No tenderness. No edema.  Skin:    General: Skin is warm and dry.     Capillary Refill: Capillary refill takes less than 2 seconds.  Neurological:     General: No focal deficit present.     Mental Status: She is alert.     Cranial Nerves: No cranial nerve deficit.     Motor: No weakness.  Psychiatric:        Mood and Affect: Mood normal.     ED Results / Procedures / Treatments   Labs (all labs ordered are listed, but only abnormal results are displayed) Labs Reviewed  COMPREHENSIVE METABOLIC PANEL - Abnormal; Notable for the following components:      Result Value   Glucose, Bld 177 (*)    Albumin 3.4 (*)    All other components  within normal limits  RESP PANEL BY RT-PCR (RSV, FLU A&B, COVID)  RVPGX2  CULTURE, BLOOD (ROUTINE X 2)  CULTURE, BLOOD (ROUTINE X 2)  CBC  HCG, SERUM, QUALITATIVE  BRAIN NATRIURETIC PEPTIDE  D-DIMER, QUANTITATIVE  I-STAT CG4 LACTIC ACID, ED  TROPONIN I (HIGH SENSITIVITY)  TROPONIN I (HIGH SENSITIVITY)    EKG EKG Interpretation Date/Time:  Sunday July 11 2023 14:44:13 EST Ventricular  Rate:  111 PR Interval:  159 QRS Duration:  119 QT Interval:  365 QTC Calculation: 496 R Axis:   -19  Text Interpretation: Sinus tachycardia Incomplete right bundle branch block Baseline wander in lead(s) V5 Since last tracing of earlier today No significant change was found Confirmed by Elayne Snare ([id number]) on 07/11/2023 4:23:19 PM  Radiology CT Angio Chest PE W and/or Wo Contrast Result Date: 07/11/2023 CLINICAL DATA:  Two-week history of worsening shortness of breath associated with chest pain and cough. Abdominal pain and emesis EXAM: CT ANGIOGRAPHY CHEST CT ABDOMEN AND PELVIS WITH CONTRAST TECHNIQUE: Multidetector CT imaging of the chest was performed using the standard protocol during bolus administration of intravenous contrast. Multiplanar CT image reconstructions and MIPs were obtained to evaluate the vascular anatomy. Multidetector CT imaging of the abdomen and pelvis was performed using the standard protocol during bolus administration of intravenous contrast. RADIATION DOSE REDUCTION: This exam was performed according to the departmental dose-optimization program which includes automated exposure control, adjustment of the mA and/or kV according to patient size and/or use of iterative reconstruction technique. CONTRAST:  OMNIPAQUE IOHEXOL 350 MG/ML SOLN COMPARISON:  CT chest dated 04/17/2023, CT abdomen and pelvis dated 06/10/2022 FINDINGS: CTA CHEST FINDINGS Cardiovascular: The study is adequate for the evaluation of pulmonary embolism to the level of the proximal segmental arteries.  Decreased sensitivity and specificity for detailed findings due to photon starvation and contrast bolus timing. There are no filling defects in the central, lobar, or proximal segmental pulmonary artery branches to suggest acute pulmonary embolism. Mild dilation of main pulmonary artery measuring 3.4 cm. Normal heart size. No significant pericardial fluid/thickening. Mediastinum/Nodes: Imaged thyroid gland without nodules meeting criteria for imaging follow-up by size. Normal esophagus. No pathologically enlarged axillary, supraclavicular, mediastinal, or hilar lymph nodes. Lungs/Pleura: The central airways are patent. AP effacement of the trachea and bilateral bronchi. Subsegmental mucous plugging in the left lower lobe. Previously noted left lower lobe ground-glass nodules are no longer seen. Interval development of new patchy ground-glass opacities in the left lower lobe. Background of diffuse mosaic attenuation. No pneumothorax. No pleural effusion. Musculoskeletal: No acute or abnormal lytic or blastic osseous lesions. Review of the MIP images confirms the above findings. CT ABDOMEN and PELVIS FINDINGS Hepatobiliary: No focal hepatic lesions. No intra or extrahepatic biliary ductal dilation. Normal gallbladder. Pancreas: No focal lesions or main ductal dilation. Spleen: Normal in size without focal abnormality. Adrenals/Urinary Tract: No adrenal nodules. No suspicious renal mass or hydronephrosis. Punctate nonobstructing right interpolar stone. No focal bladder wall thickening. Stomach/Bowel: Normal appearance of the stomach. No evidence of bowel wall thickening, distention, or inflammatory changes. Appendectomy. Vascular/Lymphatic: No significant vascular findings are present. No enlarged abdominal or pelvic lymph nodes. Reproductive: No adnexal masses. Other: No free fluid, fluid collection, or free air. Musculoskeletal: No acute or abnormal lytic or blastic osseous lesions. Bilateral L5 pars interarticularis  defects with without listhesis. IMPRESSION: 1. No evidence of pulmonary embolism to the level of the proximal segmental arteries. Vessels distal to this level are suboptimally evaluated due to photon starvation artifact and contrast bolus timing. 2. 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 3. Subsegmental mucous plugging in the left lower lobe and interval development of new patchy ground-glass opacities in the left lower lobe, likely infectious/inflammatory. 4. Background of diffuse mosaic attenuation, which can be seen in the setting of small airways disease. 5. Mild dilation of main pulmonary artery measuring 3.4 cm, which can be seen in the  setting of pulmonary hypertension. 6. No acute abdominopelvic findings. Electronically Signed   By: Agustin Cree M.D.   On: 07/11/2023 19:39   CT ABDOMEN PELVIS W CONTRAST Result Date: 07/11/2023 CLINICAL DATA:  Two-week history of worsening shortness of breath associated with chest pain and cough. Abdominal pain and emesis EXAM: CT ANGIOGRAPHY CHEST CT ABDOMEN AND PELVIS WITH CONTRAST TECHNIQUE: Multidetector CT imaging of the chest was performed using the standard protocol during bolus administration of intravenous contrast. Multiplanar CT image reconstructions and MIPs were obtained to evaluate the vascular anatomy. Multidetector CT imaging of the abdomen and pelvis was performed using the standard protocol during bolus administration of intravenous contrast. RADIATION DOSE REDUCTION: This exam was performed according to the departmental dose-optimization program which includes automated exposure control, adjustment of the mA and/or kV according to patient size and/or use of iterative reconstruction technique. CONTRAST:  OMNIPAQUE IOHEXOL 350 MG/ML SOLN COMPARISON:  CT chest dated 04/17/2023, CT abdomen and pelvis dated 06/10/2022 FINDINGS: CTA CHEST FINDINGS Cardiovascular: The study is adequate for the evaluation of  pulmonary embolism to the level of the proximal segmental arteries. Decreased sensitivity and specificity for detailed findings due to photon starvation and contrast bolus timing. There are no filling defects in the central, lobar, or proximal segmental pulmonary artery branches to suggest acute pulmonary embolism. Mild dilation of main pulmonary artery measuring 3.4 cm. Normal heart size. No significant pericardial fluid/thickening. Mediastinum/Nodes: Imaged thyroid gland without nodules meeting criteria for imaging follow-up by size. Normal esophagus. No pathologically enlarged axillary, supraclavicular, mediastinal, or hilar lymph nodes. Lungs/Pleura: The central airways are patent. AP effacement of the trachea and bilateral bronchi. Subsegmental mucous plugging in the left lower lobe. Previously noted left lower lobe ground-glass nodules are no longer seen. Interval development of new patchy ground-glass opacities in the left lower lobe. Background of diffuse mosaic attenuation. No pneumothorax. No pleural effusion. Musculoskeletal: No acute or abnormal lytic or blastic osseous lesions. Review of the MIP images confirms the above findings. CT ABDOMEN and PELVIS FINDINGS Hepatobiliary: No focal hepatic lesions. No intra or extrahepatic biliary ductal dilation. Normal gallbladder. Pancreas: No focal lesions or main ductal dilation. Spleen: Normal in size without focal abnormality. Adrenals/Urinary Tract: No adrenal nodules. No suspicious renal mass or hydronephrosis. Punctate nonobstructing right interpolar stone. No focal bladder wall thickening. Stomach/Bowel: Normal appearance of the stomach. No evidence of bowel wall thickening, distention, or inflammatory changes. Appendectomy. Vascular/Lymphatic: No significant vascular findings are present. No enlarged abdominal or pelvic lymph nodes. Reproductive: No adnexal masses. Other: No free fluid, fluid collection, or free air. Musculoskeletal: No acute or abnormal  lytic or blastic osseous lesions. 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 IMPRESSION: 1. No evidence of pulmonary embolism to the level of the proximal segmental arteries. Vessels distal to this level are suboptimally evaluated due to photon starvation artifact and contrast bolus timing. 2. 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 3. Subsegmental mucous plugging in the left lower lobe and interval development of new patchy ground-glass opacities in the left lower lobe, likely infectious/inflammatory. 4. Background of diffuse mosaic attenuation, which can be seen in the setting of small airways disease. 5. Mild dilation of main pulmonary artery measuring 3.4 cm, which can be seen in the setting of pulmonary hypertension. 6. No acute abdominopelvic findings. Electronically Signed   By: Agustin Cree M.D.   On: 07/11/2023 19:39   DG Chest 2 View Result Date: 07/11/2023 CLINICAL DATA:  Two-week history of  chest pain and congestion EXAM: CHEST - 2 VIEW COMPARISON:  Chest radiograph dated 04/17/2023 FINDINGS: Low lung volumes with bronchovascular crowding. Asymmetric peripheral right lower lung hazy and patchy opacity. No pleural effusion or pneumothorax. The heart size and mediastinal contours are within normal limits. No acute osseous abnormality. IMPRESSION: 1. Asymmetric peripheral right lower lung hazy and patchy opacity, which may represent atelectasis or pneumonia. 2.  Low lung volumes with bronchovascular crowding. Electronically Signed   By: Agustin Cree M.D.   On: 07/11/2023 13:18    Procedures .Critical Care  Performed by: Linwood Dibbles, PA-C Authorized by: Linwood Dibbles, PA-C   Critical care provider statement:    Critical care time (minutes):  35   Critical care was necessary to treat or prevent imminent or life-threatening deterioration of the following conditions:  Respiratory failure and sepsis   Critical care was time  spent personally by me on the following activities:  Development of treatment plan with patient or surrogate, discussions with consultants, evaluation of patient's response to treatment, examination of patient, ordering and review of laboratory studies, ordering and review of radiographic studies, ordering and performing treatments and interventions, pulse oximetry, re-evaluation of patient's condition and review of old charts     Medications Ordered in ED Medications  ipratropium-albuterol (DUONEB) 0.5-2.5 (3) MG/3ML nebulizer solution 3 mL (3 mLs Nebulization Not Given 07/11/23 1742)  magnesium sulfate IVPB 2 g 50 mL (2 g Intravenous Not Given 07/11/23 1741)  cefTRIAXone (ROCEPHIN) 1 g in sodium chloride 0.9 % 100 mL IVPB (has no administration in time range)  azithromycin (ZITHROMAX) 500 mg in sodium chloride 0.9 % 250 mL IVPB (has no administration in time range)  methylPREDNISolone sodium succinate (SOLU-MEDROL) 125 mg/2 mL injection 125 mg (125 mg Intravenous Given 07/11/23 1604)  ipratropium-albuterol (DUONEB) 0.5-2.5 (3) MG/3ML nebulizer solution 3 mL (3 mLs Nebulization Given 07/11/23 1557)  sodium chloride 0.9 % bolus 1,000 mL (0 mLs Intravenous Stopped 07/11/23 1917)  prochlorperazine (COMPAZINE) injection 10 mg (10 mg Intravenous Given 07/11/23 1710)  diphenhydrAMINE (BENADRYL) injection 12.5 mg (12.5 mg Intravenous Given 07/11/23 1710)  iohexol (OMNIPAQUE) 350 MG/ML injection 100 mL (100 mLs Intravenous Contrast Given 07/11/23 1849)    ED Course/ Medical Decision Making/ A&P Clinical Course as of 07/11/23 2026  Sun Jul 11, 2023  1716 Patient with HA. Non focal neuro exam. Will give migraine cocktail. Still with wheeze however improved. Will give Mag and additional duoneb [BH]  1807 Pt declined Duoneb, mag. Wants to "wait" to see if steroids will "kick in" [BH]  2025 Dr. Margo Aye with medicine to admit [BH]    Clinical Course User Index [BH] Assia Meanor A, PA-C   33 year old  extensive past medical history here for evaluation of multiple complaints.  On arrival she is afebrile however does appear to not feel well.  She is tachycardic, tachypneic.  She has diffuse rales throughout.  Received breathing treatment with EMS.  No clinical evidence of VTE on exam.  She does endorse some chest tightness however no lower extremity pain or swelling.  No history of PE or DVT.  Apparently few days ago she also had some nausea, vomiting, diarrhea which subsequently stopped.  Will plan on labs, imaging and reassess  Labs and imaging personally viewed interpreted:  CBC without leukocytosis 9 metabolic panel glucose 177 Pregnancy test negative Viral panel negative Troponin 5 X-ray with right lower lobe opacity, with vascular crowding EKG without ischemic changes  Plan on steroids, she does appear mildly  dry will plan on IV fluids, DuoNeb.  Will scan chest and abdomen.  CTA chest shows no PE however does show a left lobe pneumonia CT abdomen pelvis does not show any significant findings  Discussed results with patient.  She is hypoxic to 88-89% on RA.  Placed on 2 L via nasal cannula.  She still has some wheezing.  Will proceed with additional DuoNeb, mag, which she declined previously.    Will be admitted for acute hypoxic respiratory failure she does need SIRS criteria with source infection, tachycardia, tachypnea, hypoxia.  Thankfully lactic acid not elevated.  She was placed on 2 L via nasal cannula.  Started on Rocephin, azithromycin.  CONSULT with Dr. Margo Aye with medicine who is agreeable to evaluate patient for admission.  The patient appears reasonably stabilized for admission considering the current resources, flow, and capabilities available in the ED at this time, and I doubt any other [facility] requiring further screening and/or treatment in the ED prior to admission.                                Medical Decision Making Amount and/or Complexity of Data  Reviewed Independent Historian: EMS External Data Reviewed: labs, radiology, ECG and notes. Labs: ordered. Decision-making details documented in ED Course. Radiology: ordered and independent interpretation performed. Decision-making details documented in ED Course. ECG/medicine tests: ordered and independent interpretation performed. Decision-making details documented in ED Course.  Risk OTC drugs. Prescription drug management. Parenteral controlled substances. Decision regarding hospitalization. Diagnosis or treatment significantly limited by social determinants of health.          Final Clinical Impression(s) / ED Diagnoses Final diagnoses:  Severe persistent asthma with exacerbation  Pneumonia of left lower lobe due to infectious organism  SIRS (systemic inflammatory response syndrome) (HCC)  Acute respiratory failure with hypoxia [facility])    Rx / DC Orders ED Discharge Orders     None         Alexis Reber A, PA-C 07/11/23 2026    Rexford Maus, DO 07/11/23 2228

## 2023-07-12 LAB — RESPIRATORY PANEL BY PCR

## 2023-07-12 LAB — BLOOD CULTURE ID PANEL (REFLEXED) - BCID2

## 2023-07-12 LAB — CBC
HCT: 38 % (ref 36.0–46.0)
Hemoglobin: 12.1 g/dL (ref 12.0–15.0)
MCH: 29.1 pg (ref 26.0–34.0)
MCHC: 31.8 g/dL (ref 30.0–36.0)
MCV: 91.3 fL (ref 80.0–100.0)
Platelets: 233 10*3/uL (ref 150–400)
RBC: 4.16 MIL/uL (ref 3.87–5.11)
RDW: 12.8 % (ref 11.5–15.5)
WBC: 4.1 10*3/uL (ref 4.0–10.5)
nRBC: 0 % (ref 0.0–0.2)

## 2023-07-12 LAB — HEMOGLOBIN A1C
Hgb A1c MFr Bld: 7.3 % — ABNORMAL HIGH (ref 4.8–5.6)
Mean Plasma Glucose: 162.81 mg/dL

## 2023-07-12 LAB — PROCALCITONIN: Procalcitonin: <0.1 ng/mL

## 2023-07-12 LAB — BASIC METABOLIC PANEL
Anion gap: 12 (ref 5–15)
BUN: 9 mg/dL (ref 6–20)
CO2: 19 mmol/L — ABNORMAL LOW (ref 22–32)
Calcium: 8.8 mg/dL — ABNORMAL LOW (ref 8.9–10.3)
Chloride: 103 mmol/L (ref 98–111)
Creatinine, Ser: 0.84 mg/dL (ref 0.44–1.00)
GFR, Estimated: >60 mL/min (ref >60)
Glucose, Bld: 411 mg/dL — ABNORMAL HIGH (ref 70–99)
Potassium: 4.1 mmol/L (ref 3.5–5.1)
Sodium: 134 mmol/L — ABNORMAL LOW (ref 135–145)

## 2023-07-12 LAB — BLOOD GAS, VENOUS
Acid-base deficit: 0.4 mmol/L (ref 0.0–2.0)
Bicarbonate: 25.9 mmol/L (ref 20.0–28.0)
O2 Saturation: 71.4 %
Patient temperature: 37.1
pCO2, Ven: 48 mm[Hg] (ref 44–60)
pH, Ven: 7.34 (ref 7.25–7.43)
pO2, Ven: 44 mm[Hg] (ref 32–45)

## 2023-07-12 LAB — CBG MONITORING, ED
Glucose-Capillary: 330 mg/dL — ABNORMAL HIGH (ref 70–99)
Glucose-Capillary: 308 mg/dL — ABNORMAL HIGH (ref 70–99)

## 2023-07-12 LAB — GLUCOSE, CAPILLARY
Glucose-Capillary: 306 mg/dL — ABNORMAL HIGH (ref 70–99)
Glucose-Capillary: 277 mg/dL — ABNORMAL HIGH (ref 70–99)

## 2023-07-12 LAB — MAGNESIUM: Magnesium: 1.6 mg/dL — ABNORMAL LOW (ref 1.7–2.4)

## 2023-07-12 LAB — PHOSPHORUS: Phosphorus: 1.5 mg/dL — ABNORMAL LOW (ref 2.5–4.6)

## 2023-07-12 MED ORDER — SODIUM CHLORIDE 0.9 % IV SOLN
500.0000 mg | INTRAVENOUS | Status: AC
  Administered 2023-07-12 – 2023-07-13 (×2): 500 mg via INTRAVENOUS
  Filled 2023-07-12 (×2): qty 5

## 2023-07-12 MED ORDER — INSULIN ASPART 100 UNIT/ML IJ SOLN
0.0000 [IU] | Freq: Three times a day (TID) | INTRAMUSCULAR | Status: DC
  Administered 2023-07-12 (×2): 15 [IU] via SUBCUTANEOUS
  Administered 2023-07-13: 11 [IU] via SUBCUTANEOUS
  Administered 2023-07-13: 7 [IU] via SUBCUTANEOUS
  Administered 2023-07-13 – 2023-07-14 (×2): 4 [IU] via SUBCUTANEOUS
  Administered 2023-07-14: 11 [IU] via SUBCUTANEOUS
  Administered 2023-07-14: 4 [IU] via SUBCUTANEOUS
  Administered 2023-07-15: 3 [IU] via SUBCUTANEOUS
  Administered 2023-07-15: 4 [IU] via SUBCUTANEOUS
  Administered 2023-07-15: 15 [IU] via SUBCUTANEOUS
  Administered 2023-07-16: 7 [IU] via SUBCUTANEOUS

## 2023-07-12 MED ORDER — INSULIN ASPART 100 UNIT/ML IJ SOLN
0.0000 [IU] | Freq: Every day | INTRAMUSCULAR | Status: DC
  Administered 2023-07-12 – 2023-07-14 (×3): 3 [IU] via SUBCUTANEOUS
  Administered 2023-07-15: 4 [IU] via SUBCUTANEOUS

## 2023-07-12 MED ORDER — KETOROLAC TROMETHAMINE 15 MG/ML IJ SOLN
15.0000 mg | INTRAMUSCULAR | Status: AC
  Administered 2023-07-12: 15 mg via INTRAVENOUS
  Filled 2023-07-12: qty 1

## 2023-07-12 MED ORDER — METOCLOPRAMIDE HCL 5 MG/ML IJ SOLN
5.0000 mg | INTRAMUSCULAR | Status: AC
  Administered 2023-07-12: 5 mg via INTRAVENOUS
  Filled 2023-07-12: qty 2

## 2023-07-12 MED ORDER — DIPHENHYDRAMINE HCL 25 MG PO CAPS
25.0000 mg | ORAL_CAPSULE | Freq: Once | ORAL | Status: AC
  Administered 2023-07-12: 25 mg via ORAL
  Filled 2023-07-12: qty 1

## 2023-07-12 MED ORDER — PREDNISONE 20 MG PO TABS
40.0000 mg | ORAL_TABLET | Freq: Every day | ORAL | Status: DC
Start: 2023-07-12 — End: 2023-07-15
  Administered 2023-07-12 – 2023-07-15 (×4): 40 mg via ORAL
  Filled 2023-07-12 (×4): qty 2

## 2023-07-12 MED ORDER — DIPHENHYDRAMINE HCL 50 MG/ML IJ SOLN
12.5000 mg | Freq: Once | INTRAMUSCULAR | Status: AC
  Administered 2023-07-12: 12.5 mg via INTRAVENOUS
  Filled 2023-07-12: qty 1

## 2023-07-12 MED ORDER — MAGNESIUM SULFATE 2 GM/50ML IV SOLN: 2.0000 g | Freq: Once | INTRAVENOUS | Status: DC

## 2023-07-12 MED ORDER — CYCLOBENZAPRINE HCL 10 MG PO TABS
10.0000 mg | ORAL_TABLET | Freq: Three times a day (TID) | ORAL | Status: DC | PRN
  Administered 2023-07-12: 10 mg via ORAL
  Filled 2023-07-12: qty 1

## 2023-07-12 MED ORDER — INSULIN GLARGINE-YFGN 100 UNIT/ML ~~LOC~~ SOLN
25.0000 [IU] | Freq: Every day | SUBCUTANEOUS | Status: DC
  Administered 2023-07-12 – 2023-07-16 (×5): 25 [IU] via SUBCUTANEOUS
  Filled 2023-07-12 (×5): qty 0.25

## 2023-07-12 MED ORDER — IBUPROFEN 200 MG PO TABS
600.0000 mg | ORAL_TABLET | Freq: Four times a day (QID) | ORAL | Status: DC | PRN
  Administered 2023-07-12 – 2023-07-15 (×2): 600 mg via ORAL
  Filled 2023-07-12 (×2): qty 3

## 2023-07-12 MED ORDER — ALBUTEROL SULFATE (2.5 MG/3ML) 0.083% IN NEBU: 2.5000 mg | INHALATION_SOLUTION | RESPIRATORY_TRACT | Status: DC | PRN

## 2023-07-12 MED ORDER — MAGNESIUM SULFATE 2 GM/50ML IV SOLN
2.0000 g | Freq: Once | INTRAVENOUS | Status: AC
  Administered 2023-07-12: 2 g via INTRAVENOUS
  Filled 2023-07-12: qty 50

## 2023-07-12 NOTE — Progress Notes (Signed)
Arrived to room. Patient up on the chair with other staff providing care at this time. Instructed that she will need to be back to bed. Requested they notify nurse when patient is back in bed. To reconsult [facility]. VU. Tomasita Morrow, RN V[facility]

## 2023-07-12 NOTE — Plan of Care (Signed)

## 2023-07-12 NOTE — Progress Notes (Addendum)
PHARMACY - PHYSICIAN COMMUNICATION CRITICAL VALUE ALERT - BLOOD CULTURE IDENTIFICATION (BCID)  Yvette Gibbs is an 33 y.o. female who presented to [facility] on 07/11/2023 with a chief complaint of asthma exacerbation and possible pneumonia.  Assessment:  blood cultures 1/3 aerobic bottle with staph epi no resistance, possible contamination.    Current antibiotics: azithromycin, ceftriaxone  Changes to prescribed antibiotics recommended:  Patient is on recommended antibiotics - No changes needed  No results found for this or any previous visit.   Alphia Moh, PharmD, BCPS, BCCP Clinical Pharmacist  Please check AMION for all [facility] phone numbers After 10:00 PM, call Main Pharmacy [phone]

## 2023-07-12 NOTE — Progress Notes (Addendum)
PROGRESS NOTE    Yvette Gibbs  ZOX:[id number] DOB: April 28, 1991 DOA: 07/11/2023 PCP: Grayce Sessions, NP     Brief Narrative:   Yvette Gibbs is a 33 y.o. female with medical history significant for severe morbid obesity BMI 70, OSA on BiPAP, asthma, DM2, HTN, chronic HFpEF, chronic depression, GERD who presents to ER via EMS from home due to complaints of shortness of breath, progressively worsening for the past 2 weeks.  Associated with a nonproductive cough and diffuse wheezing.  Early on in her illness she had nausea vomiting and diarrhea which have now resolved.  Cough and audible wheezing persist.  She received bronchodilators en route via EMS.   In the ER, tachycardic and tachypneic with O2 saturation in the 80s.  Not on oxygen supplementation at baseline.  The patient received IV Solu-Medrol, and multiple rounds of nebulizer treatment.  CTA PE was negative for acute pulmonary embolism however showed subsegmental mucous plugging in the left lower lobe and interval development of new patchy groundglass opacity in the left lower lobe concerning for pneumonia.  The patient was started on empiric IV antibiotics in the ER, Rocephin and IV azithromycin.  [facility], hospitalist service, was asked to admit.   Assessment & Plan:   Principal Problem:   Acute hypoxic respiratory failure (HCC) Active Problems:   Acute asthma exacerbation   HTN (hypertension)   Diabetes mellitus type 2, insulin dependent (HCC)   OSA (obstructive sleep apnea)   Class 3 obesity   Obesity hypoventilation syndrome (HCC)   NAFLD (nonalcoholic fatty liver disease)   # Asthma with exacerbation Reports mild improvement today from admission, reports still feeling very short of breath with ambulation. Refused magnesium initially but accepts it this morning (also helpful as Mg level is low). Trop and bnp wnl, cta neg for PE.  - continue breathing treatments, steroids  # CAP? No leukocytosis or documented fever  but CTA with possible infiltrate, and patient with over 2 weeks of symptoms. Covid/flu/rsv neg - continue ceftriaxone/azithromycin - f/u rvp  # Acute hypoxic respiratory failure 80s on arrival, normalized with o2 supplementation - continue [place] O2, wean as able  # OSA/OHS No hypercarbia noted on vbg - continue home bipap at bedtime  # T2DM A1c in the 7s, here 400s, started on sensitive sliding scale and no home long-acting ordered - semglee 25 for home lantus 25 - switch ssi to resistant  # HTN Bp appropriate - home spiro, statin  DVT prophylaxis: lovenox Code Status: full Family Communication: none at bedside  Level of care: Progressive Status is: Inpatient Remains inpatient appropriate because: severity of illness    Consultants:  none  Procedures: none  Antimicrobials:  Ceftriaxone/azithromycin    Subjective: Reports breathing a bit more easily but continues to endorse chest tightness and dyspnea with exertion  Objective: Vitals:   07/12/23 0400 07/12/23 0447 07/12/23 0740 07/12/23 0741  BP: 137/64  (!) 141/83   Pulse: (!) 104  89   Resp: (!) 24  (!) 21   Temp:  99.2 F (37.3 C)  98.2 F (36.8 C)  TempSrc:  Oral  Oral  SpO2: 94%  93%   Weight:      Height:        Intake/Output Summary (Last 24 hours) at 07/12/2023 0819 Last data filed at 07/11/2023 1917 Gross per 24 hour  Intake 1000 ml  Output --  Net 1000 ml   Filed Weights   07/11/23 1143  Weight: (!) 174.8 kg  Examination:  General exam: Appears calm and comfortable  Respiratory system: decreased air entry, faint exp wheeze. Cardiovascular system: S1 & S2 heard, RRR.   Gastrointestinal system: Abdomen is obese, soft and nontender. N . Central nervous system: Alert and oriented. No focal neurological deficits. Extremities: Symmetric 5 x 5 power. Trace LE edema Skin: No rashes, lesions or ulcers Psychiatry: Judgement and insight appear normal. Mood & affect appropriate.     Data  Reviewed: I have personally reviewed following labs and imaging studies  CBC: Recent Labs  Lab 07/11/23 1203 07/12/23 0350  WBC 5.6 4.1  HGB 13.0 12.1  HCT 40.7 38.0  MCV 90.2 91.3  PLT 248 233   Basic Metabolic Panel: Recent Labs  Lab 07/11/23 1203 07/12/23 0350  NA 135 134*  K 4.1 4.1  CL 100 103  CO2 25 19*  GLUCOSE 177* 411*  BUN 9 9  CREATININE 0.70 0.84  CALCIUM 9.4 8.8*  MG  --  1.6*  PHOS  --  1.5*   GFR: Estimated Creatinine Clearance: 154.6 mL/min (by C-G formula based on SCr of 0.84 mg/dL). Liver Function Tests: Recent Labs  Lab 07/11/23 1203  AST 24  ALT 28  ALKPHOS 69  BILITOT 0.2  PROT 7.2  ALBUMIN 3.4*   No results for input(s): "LIPASE", "AMYLASE" in the last 168 hours. No results for input(s): "AMMONIA" in the last 168 hours. Coagulation Profile: No results for input(s): "INR", "PROTIME" in the last 168 hours. Cardiac Enzymes: No results for input(s): "CKTOTAL", "CKMB", "CKMBINDEX", "TROPONINI" in the last 168 hours. BNP (last 3 results) No results for input(s): "PROBNP" in the last 8760 hours. HbA1C: Recent Labs    07/12/23 0350  HGBA1C 7.3*   CBG: Recent Labs  Lab 07/11/23 2137 07/12/23 0733  GLUCAP 246* 330*   Lipid Profile: No results for input(s): "CHOL", "HDL", "LDLCALC", "TRIG", "CHOLHDL", "LDLDIRECT" in the last 72 hours. Thyroid Function Tests: No results for input(s): "TSH", "T4TOTAL", "FREET4", "T3FREE", "THYROIDAB" in the last 72 hours. Anemia Panel: No results for input(s): "VITAMINB12", "FOLATE", "FERRITIN", "TIBC", "IRON", "RETICCTPCT" in the last 72 hours. Urine analysis:    Component Value Date/Time   COLORURINE YELLOW 06/09/2022 2155   APPEARANCEUR CLOUDY (A) 06/09/2022 2155   LABSPEC 1.019 06/09/2022 2155   PHURINE 7.0 06/09/2022 2155   GLUCOSEU NEGATIVE 06/09/2022 2155   HGBUR LARGE (A) 06/09/2022 2155   BILIRUBINUR NEGATIVE 06/09/2022 2155   KETONESUR NEGATIVE 06/09/2022 2155   PROTEINUR >=300 (A)  06/09/2022 2155   UROBILINOGEN 0.2 09/04/2014 1001   NITRITE NEGATIVE 06/09/2022 2155   LEUKOCYTESUR NEGATIVE 06/09/2022 2155   Sepsis Labs: @LABRCNTIP (procalcitonin:4,lacticidven:4)  ) Recent Results (from the past 240 hours)  Resp panel by RT-PCR (RSV, Flu A&B, Covid) Anterior Nasal Swab     Status: None   Collection Time: 07/11/23 12:03 PM   Specimen: Anterior Nasal Swab  Result Value Ref Range Status   SARS Coronavirus 2 by RT PCR NEGATIVE NEGATIVE Final   Influenza A by PCR NEGATIVE NEGATIVE Final   Influenza B by PCR NEGATIVE NEGATIVE Final    Comment: (NOTE) The Xpert Xpress SARS-CoV-2/FLU/RSV plus assay is intended as an aid in the diagnosis of influenza from Nasopharyngeal swab specimens and should not be used as a sole basis for treatment. Nasal washings and aspirates are unacceptable for Xpert Xpress SARS-CoV-2/FLU/RSV testing.  Fact Sheet for Patients: BloggerCourse.com  Fact Sheet for Healthcare Providers: SeriousBroker.it  This test is not yet approved or cleared by the [place] FDA  and has been authorized for detection and/or diagnosis of SARS-CoV-2 by FDA under an Emergency Use Authorization (EUA). This EUA will remain in effect (meaning this test can be used) for the duration of the COVID-19 declaration under Section 564(b)(1) of the Act, 21 U.S.C. section 360bbb-3(b)(1), unless the authorization is terminated or revoked.     Resp Syncytial Virus by PCR NEGATIVE NEGATIVE Final    Comment: (NOTE) Fact Sheet for Patients: BloggerCourse.com  Fact Sheet for Healthcare Providers: SeriousBroker.it  This test is not yet approved or cleared by the [place] FDA and has been authorized for detection and/or diagnosis of SARS-CoV-2 by FDA under an Emergency Use Authorization (EUA). This EUA will remain in effect (meaning this test can be used) for the  duration of the COVID-19 declaration under Section 564(b)(1) of the Act, 21 U.S.C. section 360bbb-3(b)(1), unless the authorization is terminated or revoked.  Performed at [facility], [address] [place], [place] [address]          Radiology Studies: CT Angio Chest PE W and/or Wo Contrast Result Date: 07/11/2023 CLINICAL DATA:  Two-week history of worsening shortness of breath associated with chest pain and cough. Abdominal pain and emesis EXAM: CT ANGIOGRAPHY CHEST CT ABDOMEN AND PELVIS WITH CONTRAST TECHNIQUE: Multidetector CT imaging of the chest was performed using the standard protocol during bolus administration of intravenous contrast. Multiplanar CT image reconstructions and MIPs were obtained to evaluate the vascular anatomy. Multidetector CT imaging of the abdomen and pelvis was performed using the standard protocol during bolus administration of intravenous contrast. RADIATION DOSE REDUCTION: This exam was performed according to the departmental dose-optimization program which includes automated exposure control, adjustment of the mA and/or kV according to patient size and/or use of iterative reconstruction technique. CONTRAST:  OMNIPAQUE IOHEXOL 350 MG/ML SOLN COMPARISON:  CT chest dated 04/17/2023, CT abdomen and pelvis dated 06/10/2022 FINDINGS: CTA CHEST FINDINGS Cardiovascular: The study is adequate for the evaluation of pulmonary embolism to the level of the proximal segmental arteries. Decreased sensitivity and specificity for detailed findings due to photon starvation and contrast bolus timing. There are no filling defects in the central, lobar, or proximal segmental pulmonary artery branches to suggest acute pulmonary embolism. Mild dilation of main pulmonary artery measuring 3.4 cm. Normal heart size. No significant pericardial fluid/thickening. Mediastinum/Nodes: Imaged thyroid gland without nodules meeting criteria for imaging follow-up by size. Normal  esophagus. No pathologically enlarged axillary, supraclavicular, mediastinal, or hilar lymph nodes. Lungs/Pleura: The central airways are patent. AP effacement of the trachea and bilateral bronchi. Subsegmental mucous plugging in the left lower lobe. Previously noted left lower lobe ground-glass nodules are no longer seen. Interval development of new patchy ground-glass opacities in the left lower lobe. Background of diffuse mosaic attenuation. No pneumothorax. No pleural effusion. Musculoskeletal: No acute or abnormal lytic or blastic osseous lesions. Review of the MIP images confirms the above findings. CT ABDOMEN and PELVIS FINDINGS Hepatobiliary: No focal hepatic lesions. No intra or extrahepatic biliary ductal dilation. Normal gallbladder. Pancreas: No focal lesions or main ductal dilation. Spleen: Normal in size without focal abnormality. Adrenals/Urinary Tract: No adrenal nodules. No suspicious renal mass or hydronephrosis. Punctate nonobstructing right interpolar stone. No focal bladder wall thickening. Stomach/Bowel: Normal appearance of the stomach. No evidence of bowel wall thickening, distention, or inflammatory changes. Appendectomy. Vascular/Lymphatic: No significant vascular findings are present. No enlarged abdominal or pelvic lymph nodes. Reproductive: No adnexal masses. Other: No free fluid, fluid collection, or free air. Musculoskeletal: No acute  or abnormal lytic or blastic osseous lesions. 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 IMPRESSION: 1. No evidence of pulmonary embolism to the level of the proximal segmental arteries. Vessels distal to this level are suboptimally evaluated due to photon starvation artifact and contrast bolus timing. 2. 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 3. Subsegmental mucous plugging in the left lower lobe and interval development of new patchy ground-glass opacities in the left lower lobe,  likely infectious/inflammatory. 4. Background of diffuse mosaic attenuation, which can be seen in the setting of small airways disease. 5. Mild dilation of main pulmonary artery measuring 3.4 cm, which can be seen in the setting of pulmonary hypertension. 6. No acute abdominopelvic findings. Electronically Signed   By: Agustin Cree M.D.   On: 07/11/2023 19:39   CT ABDOMEN PELVIS W CONTRAST Result Date: 07/11/2023 CLINICAL DATA:  Two-week history of worsening shortness of breath associated with chest pain and cough. Abdominal pain and emesis EXAM: CT ANGIOGRAPHY CHEST CT ABDOMEN AND PELVIS WITH CONTRAST TECHNIQUE: Multidetector CT imaging of the chest was performed using the standard protocol during bolus administration of intravenous contrast. Multiplanar CT image reconstructions and MIPs were obtained to evaluate the vascular anatomy. Multidetector CT imaging of the abdomen and pelvis was performed using the standard protocol during bolus administration of intravenous contrast. RADIATION DOSE REDUCTION: This exam was performed according to the departmental dose-optimization program which includes automated exposure control, adjustment of the mA and/or kV according to patient size and/or use of iterative reconstruction technique. CONTRAST:  OMNIPAQUE IOHEXOL 350 MG/ML SOLN COMPARISON:  CT chest dated 04/17/2023, CT abdomen and pelvis dated 06/10/2022 FINDINGS: CTA CHEST FINDINGS Cardiovascular: The study is adequate for the evaluation of pulmonary embolism to the level of the proximal segmental arteries. Decreased sensitivity and specificity for detailed findings due to photon starvation and contrast bolus timing. There are no filling defects in the central, lobar, or proximal segmental pulmonary artery branches to suggest acute pulmonary embolism. Mild dilation of main pulmonary artery measuring 3.4 cm. Normal heart size. No significant pericardial fluid/thickening. Mediastinum/Nodes: Imaged thyroid gland  without nodules meeting criteria for imaging follow-up by size. Normal esophagus. No pathologically enlarged axillary, supraclavicular, mediastinal, or hilar lymph nodes. Lungs/Pleura: The central airways are patent. AP effacement of the trachea and bilateral bronchi. Subsegmental mucous plugging in the left lower lobe. Previously noted left lower lobe ground-glass nodules are no longer seen. Interval development of new patchy ground-glass opacities in the left lower lobe. Background of diffuse mosaic attenuation. No pneumothorax. No pleural effusion. Musculoskeletal: No acute or abnormal lytic or blastic osseous lesions. Review of the MIP images confirms the above findings. CT ABDOMEN and PELVIS FINDINGS Hepatobiliary: No focal hepatic lesions. No intra or extrahepatic biliary ductal dilation. Normal gallbladder. Pancreas: No focal lesions or main ductal dilation. Spleen: Normal in size without focal abnormality. Adrenals/Urinary Tract: No adrenal nodules. No suspicious renal mass or hydronephrosis. Punctate nonobstructing right interpolar stone. No focal bladder wall thickening. Stomach/Bowel: Normal appearance of the stomach. No evidence of bowel wall thickening, distention, or inflammatory changes. Appendectomy. Vascular/Lymphatic: No significant vascular findings are present. No enlarged abdominal or pelvic lymph nodes. Reproductive: No adnexal masses. Other: No free fluid, fluid collection, or free air. Musculoskeletal: No acute or abnormal lytic or blastic osseous lesions. 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 IMPRESSION: 1. No evidence of pulmonary embolism to the level of the proximal segmental arteries. Vessels distal to this level are suboptimally evaluated  due to photon starvation artifact and contrast bolus timing. 2. 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 3. Subsegmental mucous plugging in the left lower lobe and interval  development of new patchy ground-glass opacities in the left lower lobe, likely infectious/inflammatory. 4. Background of diffuse mosaic attenuation, which can be seen in the setting of small airways disease. 5. Mild dilation of main pulmonary artery measuring 3.4 cm, which can be seen in the setting of pulmonary hypertension. 6. No acute abdominopelvic findings. Electronically Signed   By: Agustin Cree M.D.   On: 07/11/2023 19:39   DG Chest 2 View Result Date: 07/11/2023 CLINICAL DATA:  Two-week history of chest pain and congestion EXAM: CHEST - 2 VIEW COMPARISON:  Chest radiograph dated 04/17/2023 FINDINGS: Low lung volumes with bronchovascular crowding. Asymmetric peripheral right lower lung hazy and patchy opacity. No pleural effusion or pneumothorax. The heart size and mediastinal contours are within normal limits. No acute osseous abnormality. IMPRESSION: 1. Asymmetric peripheral right lower lung hazy and patchy opacity, which may represent atelectasis or pneumonia. 2.  Low lung volumes with bronchovascular crowding. Electronically Signed   By: Agustin Cree M.D.   On: 07/11/2023 13:18        Scheduled Meds:  enoxaparin (LOVENOX) injection  80 mg Subcutaneous Q24H   insulin aspart  0-5 Units Subcutaneous QHS   insulin aspart  0-9 Units Subcutaneous TID WC   ipratropium-albuterol  3 mL Nebulization Once   ipratropium-albuterol  3 mL Nebulization Q6H   methylPREDNISolone (SOLU-MEDROL) injection  40 mg Intravenous Daily   mometasone-formoterol  2 puff Inhalation BID   rosuvastatin  20 mg Oral Daily   spironolactone  25 mg Oral Daily   Continuous Infusions:  azithromycin Stopped (07/12/23 0031)   cefTRIAXone (ROCEPHIN)  IV Stopped (07/11/23 2319)   magnesium sulfate bolus IVPB       LOS: 1 day     Silvano Bilis, MD [facility]   If 7PM-7AM, please contact night-coverage [email or web address] Password [facility] 07/12/2023, 8:19 AM

## 2023-07-12 NOTE — ED Notes (Signed)
Pt states she has a headache and would like benadryl instead of tylenol. Notified attending physician and will continue to monitor while awaiting orders/feedback.

## 2023-07-12 NOTE — Progress Notes (Signed)
   07/12/23 0135  BiPAP/CPAP/SIPAP  $ Non-Invasive Home Ventilator  Initial  BiPAP/CPAP/SIPAP Pt Type Adult  BiPAP/CPAP/SIPAP Resmed  Mask Type Full face mask  Mask Size Medium  Respiratory Rate 18 breaths/min  IPAP 20 cmH20  EPAP 10 cmH2O  FiO2 (%) 21 %  Patient Home Equipment No  Auto Titrate No  BiPAP/CPAP /SiPAP Vitals  Resp 20

## 2023-07-13 ENCOUNTER — Ambulatory Visit (INDEPENDENT_AMBULATORY_CARE_PROVIDER_SITE_OTHER): Payer: Self-pay | Admitting: Primary Care

## 2023-07-13 DIAGNOSIS — J9601 Acute respiratory failure with hypoxia: Secondary | ICD-10-CM | POA: Diagnosis not present

## 2023-07-13 LAB — GLUCOSE, CAPILLARY
Glucose-Capillary: 193 mg/dL — ABNORMAL HIGH (ref 70–99)
Glucose-Capillary: 210 mg/dL — ABNORMAL HIGH (ref 70–99)
Glucose-Capillary: 282 mg/dL — ABNORMAL HIGH (ref 70–99)
Glucose-Capillary: 299 mg/dL — ABNORMAL HIGH (ref 70–99)

## 2023-07-13 LAB — CULTURE, BLOOD (ROUTINE X 2): Special Requests: ADEQUATE

## 2023-07-13 MED ORDER — MAGNESIUM SULFATE 2 GM/50ML IV SOLN
2.0000 g | Freq: Once | INTRAVENOUS | Status: AC
  Administered 2023-07-13: 2 g via INTRAVENOUS
  Filled 2023-07-13: qty 50

## 2023-07-13 MED ORDER — SODIUM PHOSPHATES 45 MMOLE/15ML IV SOLN
30.0000 mmol | Freq: Once | INTRAVENOUS | Status: AC
  Administered 2023-07-13: 30 mmol via INTRAVENOUS
  Filled 2023-07-13: qty 10

## 2023-07-13 MED ORDER — OXYCODONE HCL 5 MG PO TABS
5.0000 mg | ORAL_TABLET | Freq: Four times a day (QID) | ORAL | Status: DC | PRN
  Administered 2023-07-13 – 2023-07-15 (×7): 5 mg via ORAL
  Filled 2023-07-13 (×7): qty 1

## 2023-07-13 NOTE — Discharge Instructions (Addendum)
[facility] assistance programs. If you are behind on your bills and expenses, and need some help to make it through a short term hardship or financial emergency, there are several organizations and charities in the [place] and [place] area that may be able to help. They range from the [employer], [employer], Landscape architect of [employer] and the local community action agency, the [employer], [employer]. These groups may be able to provide you resources to help pay your utility bills, rent, and they even offer housing assistance.  Crisis assistance program Find help for paying your rent, electric bills, free food, and even funds to pay your mortgage. The [employer] (603)144-9431) offers several services to local families, as funding allows. The Emergency Assistance Program (EAP), which they administer, provides household goods, free food, clothing, and financial aid to people in need in the [facility] area. The EAP program does have some qualification, and counselors will interview clients for financial assistance by written referral only. Referrals need to be made by the Department of Social Services or by other EAP approved human services agencies or charities in the area.  Money for resources for emergency assistance are available for security deposits for rent, water, electric, and gas, past due rent, utility bills, past due mortgage payments, food, and clothing. The [employer] also operates a Programme researcher, broadcasting/film/video on the site. More [employer].  [employer] of [employer], which can be reached at ([phone], offers emergency assistance programs for those in need of help, such as food, rent assistance, a soup kitchen, shelter, and clothing. They are based in [facility] but provide a number of services to those that qualify for assistance. Continue with [employer]  programs.  [facility] Department of Social Services may be able to offer temporary financial assistance and cash grants for paying rent and utilities. Help may be provided for local county residents who may be experiencing personal crisis when other resources, including government programs, are not available. Call [phone]  [place] [employer], which is based in [employer], provides financial assistance of up to $50.00 to help pay for rent, utilities, cooling bills, rent, and prescription medications. The program also provides secondhand furniture to those in need. [phone]  [employer] is a Geneticist, molecular. The organization can offer emergency assistance for paying rent, electric bills, utilities, food, household products and furniture. They offer extensive emergency and transitional housing for families, children and single women, and also run a Boy's and [employer]. [employer], [employer], and other aid offered too. [address], [place], [place] [address], [phone]  Additional locations of the [employer] are in [place] and other nearby communities. When you have an emergency, need free food, money for basic needs, or just need assistance around Christmas, then the [employer] may have the resources you need. Or they can refer you to nearby agencies. Learn more.  [place] Low Income Risk manager - This is offered for [facility] families. The federal government created [employer] Program provides a one-time cash grant payment to help eligible low-income families pay their electric and heating bills. [address], [place], [place] [address], ([phone]  Government and [employer] - The county administers several emergency and self-sufficiency programs. Residents of [place] can get help with energy bills and food, rent, and  other expenses. In addition,  work with a Sports coach who may be able to help you find a job or improve your employment skills. More [place] public assistance.  [place] Emergency Assistance - A program offers emergency utility and rent funds for greater [place] area residents. The program can also provide counseling and referrals to charities and government programs. Also provides food and a free meal program that serves lunch Mondays - Saturdays and dinner seven days per week to individuals in the community. [address] [place], [place] [address], [phone]  [employer] - Offers affordable apartment and housing communities across [place] and [place]. The low income and seniors can access public housing, rental assistance to qualified applicants, and apply for the section 8 rent subsidy program. Other programs include Chiropractor and Engineer, maintenance. [address], [place], [place] [address], dial [phone].  Basic needs such as clothing - Low income families can receive free items (school supplies, clothes, holiday assistance, etc.) from clothing closets while more moderate income [address] families can shop at [employer]. Locations across the area help the needy. Get information on [place] free clothing centers.  The [facility] provides transitional housing to veterans and the disabled. Clients will also access other services too, including life skills classes, case management, and assistance in finding permanent housing. [address], [place], [place] [address], call [phone]  [facility] Transitional Housing in [place] is for people who were just evicted or that are formerly homeless. The non-profit will also help then gain self-sufficiency, find a home or apartment to live in, and also provides information on rent assistance when needed. Dial [phone]  [employer] Partnership to End Homelessness is available in [place]. Families that were evicted or that are homeless can gain shelter, food, clothing, furniture, and also emergency financial assistance. Other services include financial skills and life skills coaching, job training, and case management. [address] [place], [place] [address]. Telephone [phone].  The [employer], [employer]. runs the [employer]. This can help people save money on their heating and summer cooling bills, and is free to low income families. Free upgrades can be made to your home. Phone [phone]  Many of the non-profits and programs mentioned above are all inclusive, meaning they can meet many needs of the low income, such as energy bills, food, rent, and more. However there are several organizations that focus just on rent and housing. Read more on rent assistance in [place] region.  Food pantry and assistance Some of the local food pantries and distribution centers to call for free food and groceries include [employer] [facility] [place] (phone [phone]), The [facility] (phone [phone]) and also [employer]. Dial [phone].  Several other food banks in the region provide clothing, free food and meals, access to soup kitchens and other help. Find the addresses and phone numbers of more food pantries in [place]. http://www.needhelppayingbills.com/html/[place]_assistance_pro.html  Transportation  If you have a Medicaid "blue card" or "pink card" and have no other means for transportation to [employer], clinics, dentists, hospitals, and other health related trip needs.  -Transportation services are available to all [place] locations. Trips to [facility] and [place] are provided in association with PART. -Services are provided between 6:00AM and 9:00PM Monday-Friday. -Call [phone] to schedule a trip or request  further information.  Day Tax adviser  [facility])   Day Center: M-F 8a-3p; Contact for night shelter hours.  [address] [place], [place] [address] [phone]    HOMELESS SHELTERS   - [facility] at [facility]- Call [phone] ext. [phone] or ext. [phone].  Located at [address], [place], [place] [address]   - [facility]- Women's Shelter. Call [phone]. Office located at [address] [place] [address].   - [facility] through [place] [phone].   - [facility]- Call [phone]. Located at [address] [place], [place], [place] [address].   - [facility]For Pregnant mothers. Call [phone]. Located at [address] [place], [address].   - Home of [employer] for [employer] [phone]. Office located at [address] [place], [address].   - [employer] be agreeable to help with chores. Call [phone] ext. [phone].   Men's: [address] [place],  [address].  Women's: [facility]  [address] [place], [place] [address]

## 2023-07-13 NOTE — Plan of Care (Signed)

## 2023-07-13 NOTE — Progress Notes (Addendum)
PROGRESS NOTE    Yvette Gibbs  ZOX:[id number] DOB: 1990/06/29 DOA: 07/11/2023 PCP: Grayce Sessions, NP   Brief Narrative:  This 33 yrs old female with PMH significant for severe morbid obesity BMI 70, OSA on BiPAP, asthma, DM2, HTN, chronic HFpEF, chronic depression, GERD who presents to ED with complaints of shortness of breath, progressively worsening for the past 2 weeks.  She also reports nonproductive cough and diffuse wheezing.  Early on in her illness she had nausea vomiting and diarrhea which have now resolved.  Cough and audible wheezing persist.  She received bronchodilators en route via EMS. She was hypoxic requiring supplemental oxygen.  At baseline she does not use oxygen.  CTA chest ruled out pulmonary embolism however showed subsegmental mucous plugging in the left lower lobe and interval development of new patchy groundglass opacity in the left lower lobe concerning for pneumonia.  Patient was started on empiric antibiotics ceftriaxone and Zithromax.  Patient was admitted for further evaluation.    Assessment & Plan:   Principal Problem:   Acute hypoxic respiratory failure (HCC) Active Problems:   Acute asthma exacerbation   HTN (hypertension)   Diabetes mellitus type 2, insulin dependent (HCC)   OSA (obstructive sleep apnea)   Class 3 obesity   Obesity hypoventilation syndrome (HCC)   NAFLD (nonalcoholic fatty liver disease)  Acute asthma exacerbation: Patient reports still feeling very short of breath with ambulation. Patient was given magnesium sulfate in the ED. Troponin WNL , BNP WNL , CTA chest negative for PE. Continue breathing treatment and Solu-Medrol.  Community-acquired pneumonia: No leukocytosis or documented fever . CTA with possible infiltrate, and patient  reports 2 weeks of symptoms. Covid/flu/rsv neg Continue ceftriaxone/azithromycin. RVP shows influenza A+   Acute hypoxic respiratory failure: Likely due to influenza A and asthma  exacerbation. Spo2 80s on arrival, normalized with o2 supplementation. Continue supplemental oxygen and wean as tolerated   OSA/OHS: No hypercarbia noted on vbg Continue home bipap at bedtime   DM type II: Hb A1c in the 7s, CBG 400s, started on sensitive sliding scale  Continue Semglee 25 for home lantus 25 Switched ssi to resistant   Essential HTN: BP appropriate Continue  spironolactone, statin.  Hypophosphatemia/hypomagnesemia Replaced.  Continue to monitor  DVT prophylaxis: Lovenox Code Status: Full code Family Communication: No family at bedside Disposition Plan:   Status is: Inpatient Remains inpatient appropriate because: Admitted for asthma exacerbation.    Consultants:  None  Procedures: None  Antimicrobials: Anti-infectives (From admission, onward)    Start     Dose/Rate Route Frequency Ordered Stop   07/12/23 2045  azithromycin (ZITHROMAX) 500 mg in sodium chloride 0.9 % 250 mL IVPB        500 mg 250 mL/hr over 60 Minutes Intravenous Every 24 hours 07/12/23 0826 07/14/23 2044   07/11/23 2045  cefTRIAXone (ROCEPHIN) 2 g in sodium chloride 0.9 % 100 mL IVPB        2 g 200 mL/hr over 30 Minutes Intravenous Every 24 hours 07/11/23 2031 07/15/23 2044   07/11/23 2045  azithromycin (ZITHROMAX) 500 mg in sodium chloride 0.9 % 250 mL IVPB  Status:  Discontinued        500 mg 250 mL/hr over 60 Minutes Intravenous Every 24 hours 07/11/23 2031 07/12/23 0826   07/11/23 2015  cefTRIAXone (ROCEPHIN) 1 g in sodium chloride 0.9 % 100 mL IVPB  Status:  Discontinued        1 g 200 mL/hr over 30 Minutes Intravenous  Once 07/11/23 2005 07/11/23 2035   07/11/23 2015  azithromycin (ZITHROMAX) 500 mg in sodium chloride 0.9 % 250 mL IVPB  Status:  Discontinued        500 mg 250 mL/hr over 60 Minutes Intravenous  Once 07/11/23 2005 07/11/23 2035      Subjective: Patient was seen and examined at bedside.  Overnight events noted.   Patient reports feeling improved. She still  feels tight, still reports having cough.  Objective: Vitals:   07/13/23 0008 07/13/23 0158 07/13/23 0356 07/13/23 0737  BP:   (!) 163/82 (!) 140/87  Pulse: 89  82 86  Resp:   20 20  Temp:   98.1 F (36.7 C) 98.4 F (36.9 C)  TempSrc:   Oral Oral  SpO2: 94% 95% 93% 100%  Weight:   (!) 170.4 kg   Height:        Intake/Output Summary (Last 24 hours) at 07/13/2023 1216 Last data filed at 07/13/2023 0847 Gross per 24 hour  Intake 720 ml  Output 350 ml  Net 370 ml   Filed Weights   07/11/23 1143 07/12/23 1641 07/13/23 0356  Weight: (!) 174.8 kg (!) 170.7 kg (!) 170.4 kg    Examination:  General exam: Appears calm and comfortable, not in any acute distress, morbidly obese Respiratory system: Clear to auscultation. Respiratory effort normal.  RR 16 Cardiovascular system: S1 & S2 heard, RRR. No JVD, murmurs, rubs, gallops or clicks. No pedal edema. Gastrointestinal system: Abdomen is non distended, soft and non tender. . Normal bowel sounds heard. Central nervous system: Alert and oriented x 3. No focal neurological deficits. Extremities: No edema, no cyanosis, no clubbing Skin: No rashes, lesions or ulcers Psychiatry: Judgement and insight appear normal. Mood & affect appropriate.     Data Reviewed: I have personally reviewed following labs and imaging studies  CBC: Recent Labs  Lab 07/11/23 1203 07/12/23 0350  WBC 5.6 4.1  HGB 13.0 12.1  HCT 40.7 38.0  MCV 90.2 91.3  PLT 248 233   Basic Metabolic Panel: Recent Labs  Lab 07/11/23 1203 07/12/23 0350  NA 135 134*  K 4.1 4.1  CL 100 103  CO2 25 19*  GLUCOSE 177* 411*  BUN 9 9  CREATININE 0.70 0.84  CALCIUM 9.4 8.8*  MG  --  1.6*  PHOS  --  1.5*   GFR: Estimated Creatinine Clearance: 151.8 mL/min (by C-G formula based on SCr of 0.84 mg/dL). Liver Function Tests: Recent Labs  Lab 07/11/23 1203  AST 24  ALT 28  ALKPHOS 69  BILITOT 0.2  PROT 7.2  ALBUMIN 3.4*   No results for input(s): "LIPASE",  "AMYLASE" in the last 168 hours. No results for input(s): "AMMONIA" in the last 168 hours. Coagulation Profile: No results for input(s): "INR", "PROTIME" in the last 168 hours. Cardiac Enzymes: No results for input(s): "CKTOTAL", "CKMB", "CKMBINDEX", "TROPONINI" in the last 168 hours. BNP (last 3 results) No results for input(s): "PROBNP" in the last 8760 hours. HbA1C: Recent Labs    07/12/23 0350  HGBA1C 7.3*   CBG: Recent Labs  Lab 07/12/23 0733 07/12/23 1206 07/12/23 1700 07/12/23 2237 07/13/23 0659  GLUCAP 330* 308* 306* 277* 193*   Lipid Profile: No results for input(s): "CHOL", "HDL", "LDLCALC", "TRIG", "CHOLHDL", "LDLDIRECT" in the last 72 hours. Thyroid Function Tests: No results for input(s): "TSH", "T4TOTAL", "FREET4", "T3FREE", "THYROIDAB" in the last 72 hours. Anemia Panel: No results for input(s): "VITAMINB12", "FOLATE", "FERRITIN", "TIBC", "IRON", "RETICCTPCT" in the last  72 hours. Sepsis Labs: Recent Labs  Lab 07/11/23 1616 07/12/23 0350  PROCALCITON  --  <0.10  LATICACIDVEN 1.0  --     Recent Results (from the past 240 hours)  Resp panel by RT-PCR (RSV, Flu A&B, Covid) Anterior Nasal Swab     Status: None   Collection Time: 07/11/23 12:03 PM   Specimen: Anterior Nasal Swab  Result Value Ref Range Status   SARS Coronavirus 2 by RT PCR NEGATIVE NEGATIVE Final   Influenza A by PCR NEGATIVE NEGATIVE Final   Influenza B by PCR NEGATIVE NEGATIVE Final    Comment: (NOTE) The Xpert Xpress SARS-CoV-2/FLU/RSV plus assay is intended as an aid in the diagnosis of influenza from Nasopharyngeal swab specimens and should not be used as a sole basis for treatment. Nasal washings and aspirates are unacceptable for Xpert Xpress SARS-CoV-2/FLU/RSV testing.  Fact Sheet for Patients: BloggerCourse.com  Fact Sheet for Healthcare Providers: SeriousBroker.it  This test is not yet approved or cleared by the [place] FDA and has been authorized for detection and/or diagnosis of SARS-CoV-2 by FDA under an Emergency Use Authorization (EUA). This EUA will remain in effect (meaning this test can be used) for the duration of the COVID-19 declaration under Section 564(b)(1) of the Act, 21 U.S.C. section 360bbb-3(b)(1), unless the authorization is terminated or revoked.     Resp Syncytial Virus by PCR NEGATIVE NEGATIVE Final    Comment: (NOTE) Fact Sheet for Patients: BloggerCourse.com  Fact Sheet for Healthcare Providers: SeriousBroker.it  This test is not yet approved or cleared by the [place] FDA and has been authorized for detection and/or diagnosis of SARS-CoV-2 by FDA under an Emergency Use Authorization (EUA). This EUA will remain in effect (meaning this test can be used) for the duration of the COVID-19 declaration under Section 564(b)(1) of the Act, 21 U.S.C. section 360bbb-3(b)(1), unless the authorization is terminated or revoked.  Performed at [facility], [address], [place], [place] [address]   Blood culture (routine x 2)     Status: None (Preliminary result)   Collection Time: 07/11/23  4:07 PM   Specimen: BLOOD LEFT ARM  Result Value Ref Range Status   Specimen Description BLOOD LEFT ARM  Final   Special Requests   Final    BOTTLES DRAWN AEROBIC AND ANAEROBIC Blood Culture results may not be optimal due to an inadequate volume of blood received in culture bottles   Culture   Final    NO GROWTH 2 DAYS Performed at [facility], [address], [place], [place] [address]    Report Status PENDING  Incomplete  Blood culture (routine x 2)     Status: None (Preliminary result)   Collection Time: 07/11/23  8:50 PM   Specimen: BLOOD RIGHT ARM  Result Value Ref Range Status   Specimen Description BLOOD RIGHT ARM  Final   Special Requests   Final    BOTTLES DRAWN AEROBIC ONLY Blood Culture adequate volume    Culture  Setup Time   Final    GRAM POSITIVE COCCI AEROBIC BOTTLE ONLY CRITICAL RESULT CALLED TO, READ BACK BY AND VERIFIED WITH: PHARMD L. Imogene Burn [id number] @ [phone] FH Performed at [facility], [address] [place], [place] [address]    Culture [facility] POSITIVE COCCI  Final   Report Status PENDING  Incomplete  Blood Culture ID Panel (Reflexed)     Status: Abnormal   Collection Time: 07/11/23  8:50 PM  Result Value  Ref Range Status   Enterococcus faecalis NOT DETECTED NOT DETECTED Final   Enterococcus Faecium NOT DETECTED NOT DETECTED Final   Listeria monocytogenes NOT DETECTED NOT DETECTED Final   Staphylococcus species DETECTED (A) NOT DETECTED Final    Comment: CRITICAL RESULT CALLED TO, READ BACK BY AND VERIFIED WITH: PHARMD L. CHEN [id number] @ 1913 FH    Staphylococcus aureus (BCID) NOT DETECTED NOT DETECTED Final   Staphylococcus epidermidis DETECTED (A) NOT DETECTED Final    Comment: CRITICAL RESULT CALLED TO, READ BACK BY AND VERIFIED WITH: PHARMD L. CHEN [id number] @ 1913 FH    Staphylococcus lugdunensis NOT DETECTED NOT DETECTED Final   Streptococcus species NOT DETECTED NOT DETECTED Final   Streptococcus agalactiae NOT DETECTED NOT DETECTED Final   Streptococcus pneumoniae NOT DETECTED NOT DETECTED Final   Streptococcus pyogenes NOT DETECTED NOT DETECTED Final   A.calcoaceticus-baumannii NOT DETECTED NOT DETECTED Final   Bacteroides fragilis NOT DETECTED NOT DETECTED Final   Enterobacterales NOT DETECTED NOT DETECTED Final   Enterobacter cloacae complex NOT DETECTED NOT DETECTED Final   Escherichia coli NOT DETECTED NOT DETECTED Final   Klebsiella aerogenes NOT DETECTED NOT DETECTED Final   Klebsiella oxytoca NOT DETECTED NOT DETECTED Final   Klebsiella pneumoniae NOT DETECTED NOT DETECTED Final   Proteus species NOT DETECTED NOT DETECTED Final   Salmonella species NOT DETECTED NOT DETECTED Final   Serratia marcescens NOT DETECTED NOT DETECTED Final   Haemophilus  influenzae NOT DETECTED NOT DETECTED Final   Neisseria meningitidis NOT DETECTED NOT DETECTED Final   Pseudomonas aeruginosa NOT DETECTED NOT DETECTED Final   Stenotrophomonas maltophilia NOT DETECTED NOT DETECTED Final   Candida albicans NOT DETECTED NOT DETECTED Final   Candida auris NOT DETECTED NOT DETECTED Final   Candida glabrata NOT DETECTED NOT DETECTED Final   Candida krusei NOT DETECTED NOT DETECTED Final   Candida parapsilosis NOT DETECTED NOT DETECTED Final   Candida tropicalis NOT DETECTED NOT DETECTED Final   Cryptococcus neoformans/gattii NOT DETECTED NOT DETECTED Final   Methicillin resistance mecA/C NOT DETECTED NOT DETECTED Final    Comment: Performed at [facility], [address], [place], [place] [address]  Respiratory (~20 pathogens) panel by PCR     Status: Abnormal   Collection Time: 07/12/23  9:14 AM   Specimen: Nasopharyngeal Swab; Respiratory  Result Value Ref Range Status   Adenovirus NOT DETECTED NOT DETECTED Final   Coronavirus 229E NOT DETECTED NOT DETECTED Final    Comment: (NOTE) The Coronavirus on the Respiratory Panel, DOES NOT test for the novel  Coronavirus (2019 nCoV)    Coronavirus HKU1 NOT DETECTED NOT DETECTED Final   Coronavirus NL63 NOT DETECTED NOT DETECTED Final   Coronavirus OC43 NOT DETECTED NOT DETECTED Final   Metapneumovirus NOT DETECTED NOT DETECTED Final   Rhinovirus / Enterovirus NOT DETECTED NOT DETECTED Final   Influenza A H1 2009 DETECTED (A) NOT DETECTED Final   Influenza B NOT DETECTED NOT DETECTED Final   Parainfluenza Virus 1 NOT DETECTED NOT DETECTED Final   Parainfluenza Virus 2 NOT DETECTED NOT DETECTED Final   Parainfluenza Virus 3 NOT DETECTED NOT DETECTED Final   Parainfluenza Virus 4 NOT DETECTED NOT DETECTED Final   Respiratory Syncytial Virus NOT DETECTED NOT DETECTED Final   Bordetella pertussis NOT DETECTED NOT DETECTED Final   Bordetella Parapertussis NOT DETECTED NOT DETECTED Final   Chlamydophila  pneumoniae NOT DETECTED NOT DETECTED Final   Mycoplasma pneumoniae NOT DETECTED NOT DETECTED Final    Comment:  Performed at [facility], [address] [place], [place] [address]   Radiology Studies: CT Angio Chest PE W and/or Wo Contrast Result Date: 07/11/2023 CLINICAL DATA:  Two-week history of worsening shortness of breath associated with chest pain and cough. Abdominal pain and emesis EXAM: CT ANGIOGRAPHY CHEST CT ABDOMEN AND PELVIS WITH CONTRAST TECHNIQUE: Multidetector CT imaging of the chest was performed using the standard protocol during bolus administration of intravenous contrast. Multiplanar CT image reconstructions and MIPs were obtained to evaluate the vascular anatomy. Multidetector CT imaging of the abdomen and pelvis was performed using the standard protocol during bolus administration of intravenous contrast. RADIATION DOSE REDUCTION: This exam was performed according to the departmental dose-optimization program which includes automated exposure control, adjustment of the mA and/or kV according to patient size and/or use of iterative reconstruction technique. CONTRAST:  OMNIPAQUE IOHEXOL 350 MG/ML SOLN COMPARISON:  CT chest dated 04/17/2023, CT abdomen and pelvis dated 06/10/2022 FINDINGS: CTA CHEST FINDINGS Cardiovascular: The study is adequate for the evaluation of pulmonary embolism to the level of the proximal segmental arteries. Decreased sensitivity and specificity for detailed findings due to photon starvation and contrast bolus timing. There are no filling defects in the central, lobar, or proximal segmental pulmonary artery branches to suggest acute pulmonary embolism. Mild dilation of main pulmonary artery measuring 3.4 cm. Normal heart size. No significant pericardial fluid/thickening. Mediastinum/Nodes: Imaged thyroid gland without nodules meeting criteria for imaging follow-up by size. Normal esophagus. No pathologically enlarged axillary, supraclavicular,  mediastinal, or hilar lymph nodes. Lungs/Pleura: The central airways are patent. AP effacement of the trachea and bilateral bronchi. Subsegmental mucous plugging in the left lower lobe. Previously noted left lower lobe ground-glass nodules are no longer seen. Interval development of new patchy ground-glass opacities in the left lower lobe. Background of diffuse mosaic attenuation. No pneumothorax. No pleural effusion. Musculoskeletal: No acute or abnormal lytic or blastic osseous lesions. Review of the MIP images confirms the above findings. CT ABDOMEN and PELVIS FINDINGS Hepatobiliary: No focal hepatic lesions. No intra or extrahepatic biliary ductal dilation. Normal gallbladder. Pancreas: No focal lesions or main ductal dilation. Spleen: Normal in size without focal abnormality. Adrenals/Urinary Tract: No adrenal nodules. No suspicious renal mass or hydronephrosis. Punctate nonobstructing right interpolar stone. No focal bladder wall thickening. Stomach/Bowel: Normal appearance of the stomach. No evidence of bowel wall thickening, distention, or inflammatory changes. Appendectomy. Vascular/Lymphatic: No significant vascular findings are present. No enlarged abdominal or pelvic lymph nodes. Reproductive: No adnexal masses. Other: No free fluid, fluid collection, or free air. Musculoskeletal: No acute or abnormal lytic or blastic osseous lesions. 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 IMPRESSION: 1. No evidence of pulmonary embolism to the level of the proximal segmental arteries. Vessels distal to this level are suboptimally evaluated due to photon starvation artifact and contrast bolus timing. 2. 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 3. Subsegmental mucous plugging in the left lower lobe and interval development of new patchy ground-glass opacities in the left lower lobe, likely infectious/inflammatory. 4. Background of diffuse mosaic  attenuation, which can be seen in the setting of small airways disease. 5. Mild dilation of main pulmonary artery measuring 3.4 cm, which can be seen in the setting of pulmonary hypertension. 6. No acute abdominopelvic findings. Electronically Signed   By: Agustin Cree M.D.   On: 07/11/2023 19:39   CT ABDOMEN PELVIS W CONTRAST Result Date: 07/11/2023 CLINICAL DATA:  Two-week history of worsening shortness of breath  associated with chest pain and cough. Abdominal pain and emesis EXAM: CT ANGIOGRAPHY CHEST CT ABDOMEN AND PELVIS WITH CONTRAST TECHNIQUE: Multidetector CT imaging of the chest was performed using the standard protocol during bolus administration of intravenous contrast. Multiplanar CT image reconstructions and MIPs were obtained to evaluate the vascular anatomy. Multidetector CT imaging of the abdomen and pelvis was performed using the standard protocol during bolus administration of intravenous contrast. RADIATION DOSE REDUCTION: This exam was performed according to the departmental dose-optimization program which includes automated exposure control, adjustment of the mA and/or kV according to patient size and/or use of iterative reconstruction technique. CONTRAST:  OMNIPAQUE IOHEXOL 350 MG/ML SOLN COMPARISON:  CT chest dated 04/17/2023, CT abdomen and pelvis dated 06/10/2022 FINDINGS: CTA CHEST FINDINGS Cardiovascular: The study is adequate for the evaluation of pulmonary embolism to the level of the proximal segmental arteries. Decreased sensitivity and specificity for detailed findings due to photon starvation and contrast bolus timing. There are no filling defects in the central, lobar, or proximal segmental pulmonary artery branches to suggest acute pulmonary embolism. Mild dilation of main pulmonary artery measuring 3.4 cm. Normal heart size. No significant pericardial fluid/thickening. Mediastinum/Nodes: Imaged thyroid gland without nodules meeting criteria for imaging follow-up by size.  Normal esophagus. No pathologically enlarged axillary, supraclavicular, mediastinal, or hilar lymph nodes. Lungs/Pleura: The central airways are patent. AP effacement of the trachea and bilateral bronchi. Subsegmental mucous plugging in the left lower lobe. Previously noted left lower lobe ground-glass nodules are no longer seen. Interval development of new patchy ground-glass opacities in the left lower lobe. Background of diffuse mosaic attenuation. No pneumothorax. No pleural effusion. Musculoskeletal: No acute or abnormal lytic or blastic osseous lesions. Review of the MIP images confirms the above findings. CT ABDOMEN and PELVIS FINDINGS Hepatobiliary: No focal hepatic lesions. No intra or extrahepatic biliary ductal dilation. Normal gallbladder. Pancreas: No focal lesions or main ductal dilation. Spleen: Normal in size without focal abnormality. Adrenals/Urinary Tract: No adrenal nodules. No suspicious renal mass or hydronephrosis. Punctate nonobstructing right interpolar stone. No focal bladder wall thickening. Stomach/Bowel: Normal appearance of the stomach. No evidence of bowel wall thickening, distention, or inflammatory changes. Appendectomy. Vascular/Lymphatic: No significant vascular findings are present. No enlarged abdominal or pelvic lymph nodes. Reproductive: No adnexal masses. Other: No free fluid, fluid collection, or free air. Musculoskeletal: No acute or abnormal lytic or blastic osseous lesions. 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 IMPRESSION: 1. No evidence of pulmonary embolism to the level of the proximal segmental arteries. Vessels distal to this level are suboptimally evaluated due to photon starvation artifact and contrast bolus timing. 2. 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 3. Subsegmental mucous plugging in the left lower lobe and interval development of new patchy ground-glass opacities in the left lower  lobe, likely infectious/inflammatory. 4. Background of diffuse mosaic attenuation, which can be seen in the setting of small airways disease. 5. Mild dilation of main pulmonary artery measuring 3.4 cm, which can be seen in the setting of pulmonary hypertension. 6. No acute abdominopelvic findings. Electronically Signed   By: Agustin Cree M.D.   On: 07/11/2023 19:39   DG Chest 2 View Result Date: 07/11/2023 CLINICAL DATA:  Two-week history of chest pain and congestion EXAM: CHEST - 2 VIEW COMPARISON:  Chest radiograph dated 04/17/2023 FINDINGS: Low lung volumes with bronchovascular crowding. Asymmetric peripheral right lower lung hazy and patchy opacity. No pleural effusion or pneumothorax. The heart size and mediastinal contours are  within normal limits. No acute osseous abnormality. IMPRESSION: 1. Asymmetric peripheral right lower lung hazy and patchy opacity, which may represent atelectasis or pneumonia. 2.  Low lung volumes with bronchovascular crowding. Electronically Signed   By: Agustin Cree M.D.   On: 07/11/2023 13:18   Scheduled Meds:  enoxaparin (LOVENOX) injection  80 mg Subcutaneous Q24H   insulin aspart  0-20 Units Subcutaneous TID WC   insulin aspart  0-5 Units Subcutaneous QHS   insulin glargine-yfgn  25 Units Subcutaneous Daily   ipratropium-albuterol  3 mL Nebulization Once   ipratropium-albuterol  3 mL Nebulization Q6H   mometasone-formoterol  2 puff Inhalation BID   predniSONE  40 mg Oral Q breakfast   rosuvastatin  20 mg Oral Daily   spironolactone  25 mg Oral Daily   Continuous Infusions:  azithromycin 500 mg (07/12/23 2210)   cefTRIAXone (ROCEPHIN)  IV 2 g (07/12/23 2319)     LOS: 2 days    Time spent: 50 mins    Willeen Niece, MD [facility]   If 7PM-7AM, please contact night-coverage

## 2023-07-13 NOTE — Progress Notes (Signed)
BiPAP (resmed) at bedside on stby. Pt in no distress at this time.

## 2023-07-13 NOTE — TOC Initial Note (Addendum)
Transition of Care [facility]) - Inpatient Brief Assessment   Patient Details  Name: Yvette Gibbs MRN: [id number] Date of Birth: [birth year]  Transition of Care [facility]) CM/SW Contact:    Michaela Corner, LCSWA Phone Number: 07/13/2023, 3:04 PM   Clinical Narrative: CSW met pt at bedside and introduced self/role. Pt inquired about her fiance becoming HCPOA, CSW explained that the Chaplain can assist in that process. CSW sent Chaplain on call a message via Amion. CSW provided pt resources to address SDOH needs and placed resources on pts AVS.   3:19PM: CSW spoke with on call Chaplain. Chaplain stated a consult will need to be placed. CSW notified RN and MD about consult.   3:51PM: MD placed consult for Chaplain.   Transition of Care Asessment: Insurance and Status: Insurance coverage has been reviewed Patient has primary care physician: Yes Home environment has been reviewed: Unable to assess Prior level of function:: IND Prior/Current Home Services: No current home services Social Drivers of Health Review: SDOH reviewed interventions complete Readmission risk has been reviewed: Yes Transition of care needs: no transition of care needs at this time

## 2023-07-13 NOTE — Progress Notes (Signed)
Chaplain took HCPOA to pt and was ready to explain it to pt.  Pt. Asked Chaplain to wait until her fiance arrived.  Chaplain checked in with pt 2x's until visiting hours were over but fiance had not arrived.  Chaplain left ACP with pt.  Chaplain will advise day Chaplain service to follow up.  Vernell Morgans Chaplain

## 2023-07-14 DIAGNOSIS — J9601 Acute respiratory failure with hypoxia: Secondary | ICD-10-CM | POA: Diagnosis not present

## 2023-07-14 LAB — CBC
HCT: 36.3 % (ref 36.0–46.0)
Hemoglobin: 11.4 g/dL — ABNORMAL LOW (ref 12.0–15.0)
MCH: 28.6 pg (ref 26.0–34.0)
MCHC: 31.4 g/dL (ref 30.0–36.0)
MCV: 91.2 fL (ref 80.0–100.0)
Platelets: 234 10*3/uL (ref 150–400)
RBC: 3.98 MIL/uL (ref 3.87–5.11)
RDW: 12.8 % (ref 11.5–15.5)
WBC: 4.9 10*3/uL (ref 4.0–10.5)
nRBC: 0 % (ref 0.0–0.2)

## 2023-07-14 LAB — PHOSPHORUS: Phosphorus: 3.6 mg/dL (ref 2.5–4.6)

## 2023-07-14 LAB — MAGNESIUM: Magnesium: 1.9 mg/dL (ref 1.7–2.4)

## 2023-07-14 LAB — BASIC METABOLIC PANEL
Anion gap: 12 (ref 5–15)
BUN: 11 mg/dL (ref 6–20)
CO2: 25 mmol/L (ref 22–32)
Calcium: 8.7 mg/dL — ABNORMAL LOW (ref 8.9–10.3)
Chloride: 100 mmol/L (ref 98–111)
Creatinine, Ser: 0.66 mg/dL (ref 0.44–1.00)
GFR, Estimated: >60 mL/min (ref >60)
Glucose, Bld: 178 mg/dL — ABNORMAL HIGH (ref 70–99)
Potassium: 3.4 mmol/L — ABNORMAL LOW (ref 3.5–5.1)
Sodium: 137 mmol/L (ref 135–145)

## 2023-07-14 LAB — GLUCOSE, CAPILLARY
Glucose-Capillary: 157 mg/dL — ABNORMAL HIGH (ref 70–99)
Glucose-Capillary: 191 mg/dL — ABNORMAL HIGH (ref 70–99)
Glucose-Capillary: 285 mg/dL — ABNORMAL HIGH (ref 70–99)
Glucose-Capillary: 251 mg/dL — ABNORMAL HIGH (ref 70–99)

## 2023-07-14 MED ORDER — POTASSIUM CHLORIDE 20 MEQ PO PACK
40.0000 meq | PACK | Freq: Once | ORAL | Status: DC
  Filled 2023-07-14: qty 2

## 2023-07-14 MED ORDER — IPRATROPIUM-ALBUTEROL 0.5-2.5 (3) MG/3ML IN SOLN
3.0000 mL | Freq: Two times a day (BID) | RESPIRATORY_TRACT | Status: DC
  Administered 2023-07-14 – 2023-07-16 (×3): 3 mL via RESPIRATORY_TRACT
  Filled 2023-07-14 (×4): qty 3

## 2023-07-14 MED ORDER — POTASSIUM CHLORIDE CRYS ER 20 MEQ PO TBCR
40.0000 meq | EXTENDED_RELEASE_TABLET | Freq: Two times a day (BID) | ORAL | Status: DC
  Administered 2023-07-14 – 2023-07-16 (×5): 40 meq via ORAL
  Filled 2023-07-14 (×5): qty 2

## 2023-07-14 NOTE — Plan of Care (Signed)
  Problem: Clinical Measurements: Goal: Respiratory complications will improve Outcome: Completed/Met   Problem: Activity: Goal: Risk for activity intolerance will decrease Outcome: Completed/Met   Problem: Elimination: Goal: Will not experience complications related to bowel motility Outcome: Completed/Met Goal: Will not experience complications related to urinary retention Outcome: Completed/Met   Problem: Pain Managment: Goal: General experience of comfort will improve and/or be controlled Outcome: Completed/Met   Problem: Skin Integrity: Goal: Risk for impaired skin integrity will decrease Outcome: Completed/Met

## 2023-07-14 NOTE — Plan of Care (Signed)
  Problem: Education: Goal: Ability to describe self-care measures that may prevent or decrease complications (Diabetes Survival Skills Education) will improve Outcome: Progressing Goal: Individualized Educational Video(s) Outcome: Progressing   Problem: Coping: Goal: Ability to adjust to condition or change in health will improve Outcome: Progressing   Problem: Fluid Volume: Goal: Ability to maintain a balanced intake and output will improve Outcome: Progressing   Problem: Health Behavior/Discharge Planning: Goal: Ability to identify and utilize available resources and services will improve Outcome: Progressing Goal: Ability to manage health-related needs will improve Outcome: Progressing   Problem: Metabolic: Goal: Ability to maintain appropriate glucose levels will improve Outcome: Progressing   Problem: Nutritional: Goal: Maintenance of adequate nutrition will improve Outcome: Progressing Goal: Progress toward achieving an optimal weight will improve Outcome: Progressing   Problem: Skin Integrity: Goal: Risk for impaired skin integrity will decrease Outcome: Progressing   Problem: Tissue Perfusion: Goal: Adequacy of tissue perfusion will improve Outcome: Progressing   Problem: Education: Goal: Knowledge of General Education information will improve Description: Including pain rating scale, medication(s)/side effects and non-pharmacologic comfort measures Outcome: Progressing   Problem: Health Behavior/Discharge Planning: Goal: Ability to manage health-related needs will improve Outcome: Progressing   Problem: Clinical Measurements: Goal: Ability to maintain clinical measurements within normal limits will improve Outcome: Progressing Goal: Will remain free from infection Outcome: Progressing Goal: Diagnostic test results will improve Outcome: Progressing Goal: Cardiovascular complication will be avoided Outcome: Progressing   Problem: Nutrition: Goal:  Adequate nutrition will be maintained Outcome: Progressing   Problem: Coping: Goal: Level of anxiety will decrease Outcome: Progressing   Problem: Safety: Goal: Ability to remain free from injury will improve Outcome: Progressing

## 2023-07-14 NOTE — Progress Notes (Signed)
   07/14/23 1630  Spiritual Encounters  Type of Visit Initial  Care provided to: Patient  Referral source Clinical staff  Reason for visit Advance directives  OnCall Visit No  Advance Directives (For Healthcare)  Does Patient Have a Medical Advance Directive? No    Provided AD education and documents.  Pt expressed loneliness and frustration, feeling as though she was not heard by interdisciplinary team. Chaplain provided compassionate presence and reflective listening.  Chaplains will follow up.

## 2023-07-14 NOTE — Progress Notes (Signed)
PROGRESS NOTE    Yvette Gibbs  UJW:[id number] DOB: 1990/10/26 DOA: 07/11/2023 PCP: Grayce Sessions, NP   Brief Narrative:  This 33 yrs old female with PMH significant for severe morbid obesity BMI 70, OSA on BiPAP, asthma, DM2, HTN, chronic HFpEF, chronic depression, GERD who presents to ED with complaints of shortness of breath, progressively worsening for the past 2 weeks.  She also reports nonproductive cough and diffuse wheezing.  Early on in her illness she had nausea vomiting and diarrhea which have now resolved.  Cough and audible wheezing persist.  She received bronchodilators en route via EMS. She was hypoxic requiring supplemental oxygen.  At baseline she does not use oxygen.  CTA chest ruled out pulmonary embolism however showed subsegmental mucous plugging in the left lower lobe and interval development of new patchy groundglass opacity in the left lower lobe concerning for pneumonia.  Patient was started on empiric antibiotics ceftriaxone and Zithromax.  Patient was admitted for further evaluation.   Assessment & Plan:   Principal Problem:   Acute hypoxic respiratory failure (HCC) Active Problems:   Acute asthma exacerbation   HTN (hypertension)   Diabetes mellitus type 2, insulin dependent (HCC)   OSA (obstructive sleep apnea)   Class 3 obesity   Obesity hypoventilation syndrome (HCC)   NAFLD (nonalcoholic fatty liver disease)  Acute asthma exacerbation: Patient reports still feeling very short of breath with ambulation. Patient was given magnesium sulfate in the ED. Troponin WNL , BNP WNL , CTA chest negative for PE. Continue breathing treatment and Solu-Medrol. Solu-Medrol transitioned to prednisone.  Community-acquired pneumonia: No leukocytosis or documented fever . CTA with possible infiltrate, and patient reports 2 weeks of symptoms. Covid/flu/rsv neg Continue ceftriaxone/azithromycin. RVP shows influenza A+   Acute hypoxic respiratory failure: Likely  due to influenza A and asthma exacerbation. Spo2 80s on arrival, normalized with o2 supplementation. Continue supplemental oxygen and wean as tolerated   OSA/OHS: No hypercarbia noted on vbg Continue home bipap at bedtime   DM type II: Hb A1c in the 7s, CBG 400s, started on sensitive sliding scale  Continue Semglee 25 for home lantus 25 Switched ssi to resistant   Essential HTN: BP appropriate. Continue  spironolactone, statin.  Hypophosphatemia / Hypomagnesemia: Hypokalemia : Replaced.  Continue to monitor  DVT prophylaxis: Lovenox. Code Status: Full code Family Communication: No family at bedside Disposition Plan:   Status is: Inpatient Remains inpatient appropriate because: Admitted for asthma exacerbation.    Consultants:  None  Procedures: None  Antimicrobials: Anti-infectives (From admission, onward)    Start     Dose/Rate Route Frequency Ordered Stop   07/12/23 2045  azithromycin (ZITHROMAX) 500 mg in sodium chloride 0.9 % 250 mL IVPB        500 mg 250 mL/hr over 60 Minutes Intravenous Every 24 hours 07/12/23 0826 07/13/23 2219   07/11/23 2045  cefTRIAXone (ROCEPHIN) 2 g in sodium chloride 0.9 % 100 mL IVPB        2 g 200 mL/hr over 30 Minutes Intravenous Every 24 hours 07/11/23 2031 07/15/23 2044   07/11/23 2045  azithromycin (ZITHROMAX) 500 mg in sodium chloride 0.9 % 250 mL IVPB  Status:  Discontinued        500 mg 250 mL/hr over 60 Minutes Intravenous Every 24 hours 07/11/23 2031 07/12/23 0826   07/11/23 2015  cefTRIAXone (ROCEPHIN) 1 g in sodium chloride 0.9 % 100 mL IVPB  Status:  Discontinued        1 g 200  mL/hr over 30 Minutes Intravenous  Once 07/11/23 2005 07/11/23 2035   07/11/23 2015  azithromycin (ZITHROMAX) 500 mg in sodium chloride 0.9 % 250 mL IVPB  Status:  Discontinued        500 mg 250 mL/hr over 60 Minutes Intravenous  Once 07/11/23 2005 07/11/23 2035      Subjective: Patient was seen and examined at bedside.Overnight events noted.    Patient reports feeling better,  still reports feeling short of breath while moving. Patient remains on 2 L of supplemental oxygen.  Objective: Vitals:   07/14/23 0343 07/14/23 0713 07/14/23 0759 07/14/23 1131  BP: 125/74 112/67  127/84  Pulse: 90 92  (!) 109  Resp: 20 20  (!) 24  Temp: 98.3 F (36.8 C) 98.6 F (37 C)  99.9 F (37.7 C)  TempSrc: Oral Oral  Oral  SpO2: 95% 94% 96% 91%  Weight: (!) 169 kg     Height:        Intake/Output Summary (Last 24 hours) at 07/14/2023 1413 Last data filed at 07/14/2023 1300 Gross per 24 hour  Intake 800 ml  Output 1150 ml  Net -350 ml   Filed Weights   07/12/23 1641 07/13/23 0356 07/14/23 0343  Weight: (!) 170.7 kg (!) 170.4 kg (!) 169 kg    Examination:  General exam: Appears comfortable, not in any acute distress, morbidly obese. Respiratory system: Wheezing bilaterally . respiratory effort normal.  RR 16 Cardiovascular system: S1 & S2 heard, RRR. No JVD, murmurs, rubs, gallops or clicks. No pedal edema. Gastrointestinal system: Abdomen is non distended, soft and non tender. . Normal bowel sounds heard. Central nervous system: Alert and oriented x 3. No focal neurological deficits. Extremities: No edema, no cyanosis, no clubbing Skin: No rashes, lesions or ulcers Psychiatry: Judgement and insight appear normal. Mood & affect appropriate.     Data Reviewed: I have personally reviewed following labs and imaging studies  CBC: Recent Labs  Lab 07/11/23 1203 07/12/23 0350 07/14/23 0804  WBC 5.6 4.1 4.9  HGB 13.0 12.1 11.4*  HCT 40.7 38.0 36.3  MCV 90.2 91.3 91.2  PLT 248 233 234   Basic Metabolic Panel: Recent Labs  Lab 07/11/23 1203 07/12/23 0350 07/14/23 0804  NA 135 134* 137  K 4.1 4.1 3.4*  CL 100 103 100  CO2 25 19* 25  GLUCOSE 177* 411* 178*  BUN 9 9 11   CREATININE 0.70 0.84 0.66  CALCIUM 9.4 8.8* 8.7*  MG  --  1.6* 1.9  PHOS  --  1.5* 3.6   GFR: Estimated Creatinine Clearance: 158.6 mL/min (by C-G  formula based on SCr of 0.66 mg/dL). Liver Function Tests: Recent Labs  Lab 07/11/23 1203  AST 24  ALT 28  ALKPHOS 69  BILITOT 0.2  PROT 7.2  ALBUMIN 3.4*   No results for input(s): "LIPASE", "AMYLASE" in the last 168 hours. No results for input(s): "AMMONIA" in the last 168 hours. Coagulation Profile: No results for input(s): "INR", "PROTIME" in the last 168 hours. Cardiac Enzymes: No results for input(s): "CKTOTAL", "CKMB", "CKMBINDEX", "TROPONINI" in the last 168 hours. BNP (last 3 results) No results for input(s): "PROBNP" in the last 8760 hours. HbA1C: Recent Labs    07/12/23 0350  HGBA1C 7.3*   CBG: Recent Labs  Lab 07/13/23 1159 07/13/23 1636 07/13/23 2119 07/14/23 0611 07/14/23 1134  GLUCAP 210* 282* 299* 157* 191*   Lipid Profile: No results for input(s): "CHOL", "HDL", "LDLCALC", "TRIG", "CHOLHDL", "LDLDIRECT" in the last 72  hours. Thyroid Function Tests: No results for input(s): "TSH", "T4TOTAL", "FREET4", "T3FREE", "THYROIDAB" in the last 72 hours. Anemia Panel: No results for input(s): "VITAMINB12", "FOLATE", "FERRITIN", "TIBC", "IRON", "RETICCTPCT" in the last 72 hours. Sepsis Labs: Recent Labs  Lab 07/11/23 1616 07/12/23 0350  PROCALCITON  --  <0.10  LATICACIDVEN 1.0  --     Recent Results (from the past 240 hours)  Resp panel by RT-PCR (RSV, Flu A&B, Covid) Anterior Nasal Swab     Status: None   Collection Time: 07/11/23 12:03 PM   Specimen: Anterior Nasal Swab  Result Value Ref Range Status   SARS Coronavirus 2 by RT PCR NEGATIVE NEGATIVE Final   Influenza A by PCR NEGATIVE NEGATIVE Final   Influenza B by PCR NEGATIVE NEGATIVE Final    Comment: (NOTE) The Xpert Xpress SARS-CoV-2/FLU/RSV plus assay is intended as an aid in the diagnosis of influenza from Nasopharyngeal swab specimens and should not be used as a sole basis for treatment. Nasal washings and aspirates are unacceptable for Xpert Xpress SARS-CoV-2/FLU/RSV testing.  Fact  Sheet for Patients: BloggerCourse.com  Fact Sheet for Healthcare Providers: SeriousBroker.it  This test is not yet approved or cleared by the [place] FDA and has been authorized for detection and/or diagnosis of SARS-CoV-2 by FDA under an Emergency Use Authorization (EUA). This EUA will remain in effect (meaning this test can be used) for the duration of the COVID-19 declaration under Section 564(b)(1) of the Act, 21 U.S.C. section 360bbb-3(b)(1), unless the authorization is terminated or revoked.     Resp Syncytial Virus by PCR NEGATIVE NEGATIVE Final    Comment: (NOTE) Fact Sheet for Patients: BloggerCourse.com  Fact Sheet for Healthcare Providers: SeriousBroker.it  This test is not yet approved or cleared by the [place] FDA and has been authorized for detection and/or diagnosis of SARS-CoV-2 by FDA under an Emergency Use Authorization (EUA). This EUA will remain in effect (meaning this test can be used) for the duration of the COVID-19 declaration under Section 564(b)(1) of the Act, 21 U.S.C. section 360bbb-3(b)(1), unless the authorization is terminated or revoked.  Performed at [facility], [address] [place], [place] [address]   Blood culture (routine x 2)     Status: None (Preliminary result)   Collection Time: 07/11/23  4:07 PM   Specimen: BLOOD LEFT ARM  Result Value Ref Range Status   Specimen Description BLOOD LEFT ARM  Final   Special Requests   Final    BOTTLES DRAWN AEROBIC AND ANAEROBIC Blood Culture results may not be optimal due to an inadequate volume of blood received in culture bottles   Culture   Final    NO GROWTH 3 DAYS Performed at [facility], [address], [place], [place] [address]    Report Status PENDING  Incomplete  Blood culture (routine x 2)     Status: Abnormal   Collection Time: 07/11/23  8:50 PM    Specimen: BLOOD RIGHT ARM  Result Value Ref Range Status   Specimen Description BLOOD RIGHT ARM  Final   Special Requests   Final    BOTTLES DRAWN AEROBIC ONLY Blood Culture adequate volume   Culture  Setup Time   Final    GRAM POSITIVE COCCI AEROBIC BOTTLE ONLY CRITICAL RESULT CALLED TO, READ BACK BY AND VERIFIED WITH: PHARMD L. CHEN [id number] @ 1913 FH    Culture (A)  Final    STAPHYLOCOCCUS EPIDERMIDIS THE SIGNIFICANCE OF ISOLATING THIS ORGANISM FROM A SINGLE  SET OF BLOOD CULTURES WHEN MULTIPLE SETS ARE DRAWN IS UNCERTAIN. PLEASE NOTIFY THE MICROBIOLOGY DEPARTMENT WITHIN ONE WEEK IF SPECIATION AND SENSITIVITIES ARE REQUIRED. Performed at [facility], [address] [place], [place] [address]    Report Status 07/13/2023 FINAL  Final  Blood Culture ID Panel (Reflexed)     Status: Abnormal   Collection Time: 07/11/23  8:50 PM  Result Value Ref Range Status   Enterococcus faecalis NOT DETECTED NOT DETECTED Final   Enterococcus Faecium NOT DETECTED NOT DETECTED Final   Listeria monocytogenes NOT DETECTED NOT DETECTED Final   Staphylococcus species DETECTED (A) NOT DETECTED Final    Comment: CRITICAL RESULT CALLED TO, READ BACK BY AND VERIFIED WITH: PHARMD L. CHEN [id number] @ 1913 FH    Staphylococcus aureus (BCID) NOT DETECTED NOT DETECTED Final   Staphylococcus epidermidis DETECTED (A) NOT DETECTED Final    Comment: CRITICAL RESULT CALLED TO, READ BACK BY AND VERIFIED WITH: PHARMD L. CHEN [id number] @ 1913 FH    Staphylococcus lugdunensis NOT DETECTED NOT DETECTED Final   Streptococcus species NOT DETECTED NOT DETECTED Final   Streptococcus agalactiae NOT DETECTED NOT DETECTED Final   Streptococcus pneumoniae NOT DETECTED NOT DETECTED Final   Streptococcus pyogenes NOT DETECTED NOT DETECTED Final   A.calcoaceticus-baumannii NOT DETECTED NOT DETECTED Final   Bacteroides fragilis NOT DETECTED NOT DETECTED Final   Enterobacterales NOT DETECTED NOT DETECTED Final   Enterobacter  cloacae complex NOT DETECTED NOT DETECTED Final   Escherichia coli NOT DETECTED NOT DETECTED Final   Klebsiella aerogenes NOT DETECTED NOT DETECTED Final   Klebsiella oxytoca NOT DETECTED NOT DETECTED Final   Klebsiella pneumoniae NOT DETECTED NOT DETECTED Final   Proteus species NOT DETECTED NOT DETECTED Final   Salmonella species NOT DETECTED NOT DETECTED Final   Serratia marcescens NOT DETECTED NOT DETECTED Final   Haemophilus influenzae NOT DETECTED NOT DETECTED Final   Neisseria meningitidis NOT DETECTED NOT DETECTED Final   Pseudomonas aeruginosa NOT DETECTED NOT DETECTED Final   Stenotrophomonas maltophilia NOT DETECTED NOT DETECTED Final   Candida albicans NOT DETECTED NOT DETECTED Final   Candida auris NOT DETECTED NOT DETECTED Final   Candida glabrata NOT DETECTED NOT DETECTED Final   Candida krusei NOT DETECTED NOT DETECTED Final   Candida parapsilosis NOT DETECTED NOT DETECTED Final   Candida tropicalis NOT DETECTED NOT DETECTED Final   Cryptococcus neoformans/gattii NOT DETECTED NOT DETECTED Final   Methicillin resistance mecA/C NOT DETECTED NOT DETECTED Final    Comment: Performed at [facility], [address], [place], [place] [address]  Respiratory (~20 pathogens) panel by PCR     Status: Abnormal   Collection Time: 07/12/23  9:14 AM   Specimen: Nasopharyngeal Swab; Respiratory  Result Value Ref Range Status   Adenovirus NOT DETECTED NOT DETECTED Final   Coronavirus 229E NOT DETECTED NOT DETECTED Final    Comment: (NOTE) The Coronavirus on the Respiratory Panel, DOES NOT test for the novel  Coronavirus (2019 nCoV)    Coronavirus HKU1 NOT DETECTED NOT DETECTED Final   Coronavirus NL63 NOT DETECTED NOT DETECTED Final   Coronavirus OC43 NOT DETECTED NOT DETECTED Final   Metapneumovirus NOT DETECTED NOT DETECTED Final   Rhinovirus / Enterovirus NOT DETECTED NOT DETECTED Final   Influenza A H1 2009 DETECTED (A) NOT DETECTED Final   Influenza B NOT DETECTED NOT  DETECTED Final   Parainfluenza Virus 1 NOT DETECTED NOT DETECTED Final   Parainfluenza Virus 2 NOT DETECTED NOT DETECTED Final  Parainfluenza Virus 3 NOT DETECTED NOT DETECTED Final   Parainfluenza Virus 4 NOT DETECTED NOT DETECTED Final   Respiratory Syncytial Virus NOT DETECTED NOT DETECTED Final   Bordetella pertussis NOT DETECTED NOT DETECTED Final   Bordetella Parapertussis NOT DETECTED NOT DETECTED Final   Chlamydophila pneumoniae NOT DETECTED NOT DETECTED Final   Mycoplasma pneumoniae NOT DETECTED NOT DETECTED Final    Comment: Performed at [facility], [address], [place], [place] [address]   Radiology Studies: No results found.  Scheduled Meds:  enoxaparin (LOVENOX) injection  80 mg Subcutaneous Q24H   insulin aspart  0-20 Units Subcutaneous TID WC   insulin aspart  0-5 Units Subcutaneous QHS   insulin glargine-yfgn  25 Units Subcutaneous Daily   ipratropium-albuterol  3 mL Nebulization Once   ipratropium-albuterol  3 mL Nebulization Q6H   mometasone-formoterol  2 puff Inhalation BID   potassium chloride  40 mEq Oral BID   predniSONE  40 mg Oral Q breakfast   rosuvastatin  20 mg Oral Daily   spironolactone  25 mg Oral Daily   Continuous Infusions:  cefTRIAXone (ROCEPHIN)  IV 2 g (07/13/23 1959)     LOS: 3 days    Time spent: 35 mins    Willeen Niece, MD [facility]   If 7PM-7AM, please contact night-coverage

## 2023-07-14 NOTE — Plan of Care (Signed)

## 2023-07-15 DIAGNOSIS — J9601 Acute respiratory failure with hypoxia: Secondary | ICD-10-CM | POA: Diagnosis not present

## 2023-07-15 LAB — BASIC METABOLIC PANEL
Anion gap: 11 (ref 5–15)
BUN: 11 mg/dL (ref 6–20)
CO2: 28 mmol/L (ref 22–32)
Calcium: 9 mg/dL (ref 8.9–10.3)
Chloride: 99 mmol/L (ref 98–111)
Creatinine, Ser: 0.67 mg/dL (ref 0.44–1.00)
GFR, Estimated: >60 mL/min (ref >60)
Glucose, Bld: 193 mg/dL — ABNORMAL HIGH (ref 70–99)
Potassium: 3.5 mmol/L (ref 3.5–5.1)
Sodium: 138 mmol/L (ref 135–145)

## 2023-07-15 LAB — GLUCOSE, CAPILLARY
Glucose-Capillary: 126 mg/dL — ABNORMAL HIGH (ref 70–99)
Glucose-Capillary: 185 mg/dL — ABNORMAL HIGH (ref 70–99)
Glucose-Capillary: 322 mg/dL — ABNORMAL HIGH (ref 70–99)
Glucose-Capillary: 326 mg/dL — ABNORMAL HIGH (ref 70–99)

## 2023-07-15 MED ORDER — METHYLPREDNISOLONE SODIUM SUCC 125 MG IJ SOLR
120.0000 mg | Freq: Every day | INTRAMUSCULAR | Status: DC
  Administered 2023-07-15 – 2023-07-16 (×2): 120 mg via INTRAVENOUS
  Filled 2023-07-15 (×2): qty 2

## 2023-07-15 NOTE — Progress Notes (Signed)
PROGRESS NOTE    Yvette Gibbs  GNF:[id number] DOB: May 19, 1991 DOA: 07/11/2023 PCP: Grayce Sessions, NP   Brief Narrative:  This 33 yrs old female with PMH significant for severe morbid obesity BMI 70, OSA on BiPAP, asthma, DM2, HTN, chronic HFpEF, chronic depression, GERD who presents to ED with complaints of shortness of breath, progressively worsening for the past 2 weeks.  She also reports nonproductive cough and diffuse wheezing.  Early on in her illness she had nausea vomiting and diarrhea which have now resolved.  Cough and audible wheezing persist.  She received bronchodilators en route via EMS. She was hypoxic requiring supplemental oxygen.  At baseline she does not use oxygen.  CTA chest ruled out pulmonary embolism however showed subsegmental mucous plugging in the left lower lobe and interval development of new patchy groundglass opacity in the left lower lobe concerning for pneumonia.  Patient was started on empiric antibiotics ceftriaxone and Zithromax.  Patient was admitted for further evaluation.   Assessment & Plan:   Principal Problem:   Acute hypoxic respiratory failure (HCC) Active Problems:   Acute asthma exacerbation   HTN (hypertension)   Diabetes mellitus type 2, insulin dependent (HCC)   OSA (obstructive sleep apnea)   Class 3 obesity   Obesity hypoventilation syndrome (HCC)   NAFLD (nonalcoholic fatty liver disease)  Acute asthma exacerbation: Patient reports still feeling very short of breath with ambulation. Patient was given magnesium sulfate in the ED. Troponin WNL , BNP WNL , CTA chest negative for PE. Continue breathing treatment and Solu-Medrol. Solu-Medrol transitioned to prednisone. Patient continued to have shortness of breath and wheezing. She is placed back on Solu-Medrol 60 every 12 hours for 1 day.  Community-acquired pneumonia: No leukocytosis or documented fever . CTA with possible infiltrate, and patient reports 2 weeks of symptoms.  Covid/flu/rsv neg Continue ceftriaxone/azithromycin x 5 days. RVP shows influenza A+   Acute hypoxic respiratory failure: Likely due to influenza A and asthma exacerbation. Spo2 80s on arrival, normalized with o2 supplementation. Continue supplemental oxygen and wean as tolerated   OSA/OHS: No hypercarbia noted on vbg Continue home bipap at bedtime   DM type II: Hb A1c in the 7s, CBG 400s, started on sensitive sliding scale  Continue Semglee 25 for home lantus 25 Switched ssi to resistant   Essential HTN: BP appropriate. Continue  spironolactone, statin.  Hypophosphatemia / Hypomagnesemia: Hypokalemia : Replaced.  Continue to monitor  DVT prophylaxis: Lovenox. Code Status: Full code Family Communication: No family at bedside Disposition Plan:   Status is: Inpatient Remains inpatient appropriate because: Admitted for asthma exacerbation.    Consultants:  None  Procedures: None  Antimicrobials: Anti-infectives (From admission, onward)    Start     Dose/Rate Route Frequency Ordered Stop   07/12/23 2045  azithromycin (ZITHROMAX) 500 mg in sodium chloride 0.9 % 250 mL IVPB        500 mg 250 mL/hr over 60 Minutes Intravenous Every 24 hours 07/12/23 0826 07/13/23 2219   07/11/23 2045  cefTRIAXone (ROCEPHIN) 2 g in sodium chloride 0.9 % 100 mL IVPB        2 g 200 mL/hr over 30 Minutes Intravenous Every 24 hours 07/11/23 2031 07/15/23 0721   07/11/23 2045  azithromycin (ZITHROMAX) 500 mg in sodium chloride 0.9 % 250 mL IVPB  Status:  Discontinued        500 mg 250 mL/hr over 60 Minutes Intravenous Every 24 hours 07/11/23 2031 07/12/23 0826   07/11/23 2015  cefTRIAXone (  ROCEPHIN) 1 g in sodium chloride 0.9 % 100 mL IVPB  Status:  Discontinued        1 g 200 mL/hr over 30 Minutes Intravenous  Once 07/11/23 2005 07/11/23 2035   07/11/23 2015  azithromycin (ZITHROMAX) 500 mg in sodium chloride 0.9 % 250 mL IVPB  Status:  Discontinued        500 mg 250 mL/hr over 60  Minutes Intravenous  Once 07/11/23 2005 07/11/23 2035      Subjective: Patient was seen and examined at bedside.Overnight events noted.   Patient still reports short of breath while moving around, has significant wheezing. Patient remains on 2 L of supplemental oxygen.  Objective: Vitals:   07/14/23 2349 07/15/23 0400 07/15/23 0717 07/15/23 1123  BP: (!) 147/73 (!) 146/90 135/78 137/84  Pulse: 93 89 94 94  Resp: 20 20 20 20   Temp: 98.5 F (36.9 C) 98.4 F (36.9 C) 99.4 F (37.4 C) 98.8 F (37.1 C)  TempSrc: Oral Oral Oral Oral  SpO2: 96% 91% 93% 94%  Weight:      Height:        Intake/Output Summary (Last 24 hours) at 07/15/2023 1159 Last data filed at 07/15/2023 0831 Gross per 24 hour  Intake 1627 ml  Output 1052 ml  Net 575 ml   Filed Weights   07/12/23 1641 07/13/23 0356 07/14/23 0343  Weight: (!) 170.7 kg (!) 170.4 kg (!) 169 kg    Examination:  General exam: Appears comfortable, not in any acute distress, morbidly obese. Respiratory system: Wheezing bilaterally . respiratory effort normal.  RR 18 Cardiovascular system: S1 & S2 heard, RRR. No JVD, murmurs, rubs, gallops or clicks. No pedal edema. Gastrointestinal system: Abdomen is non distended, soft and non tender. . Normal bowel sounds heard. Central nervous system: Alert and oriented x 3. No focal neurological deficits. Extremities: No edema, no cyanosis, no clubbing Skin: No rashes, lesions or ulcers Psychiatry: Judgement and insight appear normal. Mood & affect appropriate.     Data Reviewed: I have personally reviewed following labs and imaging studies  CBC: Recent Labs  Lab 07/11/23 1203 07/12/23 0350 07/14/23 0804  WBC 5.6 4.1 4.9  HGB 13.0 12.1 11.4*  HCT 40.7 38.0 36.3  MCV 90.2 91.3 91.2  PLT 248 233 234   Basic Metabolic Panel: Recent Labs  Lab 07/11/23 1203 07/12/23 0350 07/14/23 0804  NA 135 134* 137  K 4.1 4.1 3.4*  CL 100 103 100  CO2 25 19* 25  GLUCOSE 177* 411* 178*  BUN  9 9 11   CREATININE 0.70 0.84 0.66  CALCIUM 9.4 8.8* 8.7*  MG  --  1.6* 1.9  PHOS  --  1.5* 3.6   GFR: Estimated Creatinine Clearance: 158.6 mL/min (by C-G formula based on SCr of 0.66 mg/dL). Liver Function Tests: Recent Labs  Lab 07/11/23 1203  AST 24  ALT 28  ALKPHOS 69  BILITOT 0.2  PROT 7.2  ALBUMIN 3.4*   No results for input(s): "LIPASE", "AMYLASE" in the last 168 hours. No results for input(s): "AMMONIA" in the last 168 hours. Coagulation Profile: No results for input(s): "INR", "PROTIME" in the last 168 hours. Cardiac Enzymes: No results for input(s): "CKTOTAL", "CKMB", "CKMBINDEX", "TROPONINI" in the last 168 hours. BNP (last 3 results) No results for input(s): "PROBNP" in the last 8760 hours. HbA1C: No results for input(s): "HGBA1C" in the last 72 hours.  CBG: Recent Labs  Lab 07/14/23 1134 07/14/23 1543 07/14/23 2136 07/15/23 0558 07/15/23 1043  GLUCAP 191* 285* 251* 126* 185*   Lipid Profile: No results for input(s): "CHOL", "HDL", "LDLCALC", "TRIG", "CHOLHDL", "LDLDIRECT" in the last 72 hours. Thyroid Function Tests: No results for input(s): "TSH", "T4TOTAL", "FREET4", "T3FREE", "THYROIDAB" in the last 72 hours. Anemia Panel: No results for input(s): "VITAMINB12", "FOLATE", "FERRITIN", "TIBC", "IRON", "RETICCTPCT" in the last 72 hours. Sepsis Labs: Recent Labs  Lab 07/11/23 1616 07/12/23 0350  PROCALCITON  --  <0.10  LATICACIDVEN 1.0  --     Recent Results (from the past 240 hours)  Resp panel by RT-PCR (RSV, Flu A&B, Covid) Anterior Nasal Swab     Status: None   Collection Time: 07/11/23 12:03 PM   Specimen: Anterior Nasal Swab  Result Value Ref Range Status   SARS Coronavirus 2 by RT PCR NEGATIVE NEGATIVE Final   Influenza A by PCR NEGATIVE NEGATIVE Final   Influenza B by PCR NEGATIVE NEGATIVE Final    Comment: (NOTE) The Xpert Xpress SARS-CoV-2/FLU/RSV plus assay is intended as an aid in the diagnosis of influenza from Nasopharyngeal  swab specimens and should not be used as a sole basis for treatment. Nasal washings and aspirates are unacceptable for Xpert Xpress SARS-CoV-2/FLU/RSV testing.  Fact Sheet for Patients: BloggerCourse.com  Fact Sheet for Healthcare Providers: SeriousBroker.it  This test is not yet approved or cleared by the [place] FDA and has been authorized for detection and/or diagnosis of SARS-CoV-2 by FDA under an Emergency Use Authorization (EUA). This EUA will remain in effect (meaning this test can be used) for the duration of the COVID-19 declaration under Section 564(b)(1) of the Act, 21 U.S.C. section 360bbb-3(b)(1), unless the authorization is terminated or revoked.     Resp Syncytial Virus by PCR NEGATIVE NEGATIVE Final    Comment: (NOTE) Fact Sheet for Patients: BloggerCourse.com  Fact Sheet for Healthcare Providers: SeriousBroker.it  This test is not yet approved or cleared by the [place] FDA and has been authorized for detection and/or diagnosis of SARS-CoV-2 by FDA under an Emergency Use Authorization (EUA). This EUA will remain in effect (meaning this test can be used) for the duration of the COVID-19 declaration under Section 564(b)(1) of the Act, 21 U.S.C. section 360bbb-3(b)(1), unless the authorization is terminated or revoked.  Performed at [facility], [address], [place], [place] [address]   Blood culture (routine x 2)     Status: None (Preliminary result)   Collection Time: 07/11/23  4:07 PM   Specimen: BLOOD LEFT ARM  Result Value Ref Range Status   Specimen Description BLOOD LEFT ARM  Final   Special Requests   Final    BOTTLES DRAWN AEROBIC AND ANAEROBIC Blood Culture results may not be optimal due to an inadequate volume of blood received in culture bottles   Culture   Final    NO GROWTH 4 DAYS Performed at [facility], [address] [place], [place] [address]    Report Status PENDING  Incomplete  Blood culture (routine x 2)     Status: Abnormal   Collection Time: 07/11/23  8:50 PM   Specimen: BLOOD RIGHT ARM  Result Value Ref Range Status   Specimen Description BLOOD RIGHT ARM  Final   Special Requests   Final    BOTTLES DRAWN AEROBIC ONLY Blood Culture adequate volume   Culture  Setup Time   Final    GRAM POSITIVE COCCI AEROBIC BOTTLE ONLY CRITICAL RESULT CALLED TO, READ BACK BY AND VERIFIED WITH: PHARMD L. CHEN [id number] @  15 FH    Culture (A)  Final    STAPHYLOCOCCUS EPIDERMIDIS THE SIGNIFICANCE OF ISOLATING THIS ORGANISM FROM A SINGLE SET OF BLOOD CULTURES WHEN MULTIPLE SETS ARE DRAWN IS UNCERTAIN. PLEASE NOTIFY THE MICROBIOLOGY DEPARTMENT WITHIN ONE WEEK IF SPECIATION AND SENSITIVITIES ARE REQUIRED. Performed at [facility], [address] [place], [place] [address]    Report Status 07/13/2023 FINAL  Final  Blood Culture ID Panel (Reflexed)     Status: Abnormal   Collection Time: 07/11/23  8:50 PM  Result Value Ref Range Status   Enterococcus faecalis NOT DETECTED NOT DETECTED Final   Enterococcus Faecium NOT DETECTED NOT DETECTED Final   Listeria monocytogenes NOT DETECTED NOT DETECTED Final   Staphylococcus species DETECTED (A) NOT DETECTED Final    Comment: CRITICAL RESULT CALLED TO, READ BACK BY AND VERIFIED WITH: PHARMD L. CHEN [id number] @ 1913 FH    Staphylococcus aureus (BCID) NOT DETECTED NOT DETECTED Final   Staphylococcus epidermidis DETECTED (A) NOT DETECTED Final    Comment: CRITICAL RESULT CALLED TO, READ BACK BY AND VERIFIED WITH: PHARMD L. CHEN [id number] @ 1913 FH    Staphylococcus lugdunensis NOT DETECTED NOT DETECTED Final   Streptococcus species NOT DETECTED NOT DETECTED Final   Streptococcus agalactiae NOT DETECTED NOT DETECTED Final   Streptococcus pneumoniae NOT DETECTED NOT DETECTED Final   Streptococcus pyogenes NOT DETECTED NOT DETECTED Final    A.calcoaceticus-baumannii NOT DETECTED NOT DETECTED Final   Bacteroides fragilis NOT DETECTED NOT DETECTED Final   Enterobacterales NOT DETECTED NOT DETECTED Final   Enterobacter cloacae complex NOT DETECTED NOT DETECTED Final   Escherichia coli NOT DETECTED NOT DETECTED Final   Klebsiella aerogenes NOT DETECTED NOT DETECTED Final   Klebsiella oxytoca NOT DETECTED NOT DETECTED Final   Klebsiella pneumoniae NOT DETECTED NOT DETECTED Final   Proteus species NOT DETECTED NOT DETECTED Final   Salmonella species NOT DETECTED NOT DETECTED Final   Serratia marcescens NOT DETECTED NOT DETECTED Final   Haemophilus influenzae NOT DETECTED NOT DETECTED Final   Neisseria meningitidis NOT DETECTED NOT DETECTED Final   Pseudomonas aeruginosa NOT DETECTED NOT DETECTED Final   Stenotrophomonas maltophilia NOT DETECTED NOT DETECTED Final   Candida albicans NOT DETECTED NOT DETECTED Final   Candida auris NOT DETECTED NOT DETECTED Final   Candida glabrata NOT DETECTED NOT DETECTED Final   Candida krusei NOT DETECTED NOT DETECTED Final   Candida parapsilosis NOT DETECTED NOT DETECTED Final   Candida tropicalis NOT DETECTED NOT DETECTED Final   Cryptococcus neoformans/gattii NOT DETECTED NOT DETECTED Final   Methicillin resistance mecA/C NOT DETECTED NOT DETECTED Final    Comment: Performed at [facility], [address] [place], [place] [address]  Respiratory (~20 pathogens) panel by PCR     Status: Abnormal   Collection Time: 07/12/23  9:14 AM   Specimen: Nasopharyngeal Swab; Respiratory  Result Value Ref Range Status   Adenovirus NOT DETECTED NOT DETECTED Final   Coronavirus 229E NOT DETECTED NOT DETECTED Final    Comment: (NOTE) The Coronavirus on the Respiratory Panel, DOES NOT test for the novel  Coronavirus (2019 nCoV)    Coronavirus HKU1 NOT DETECTED NOT DETECTED Final   Coronavirus NL63 NOT DETECTED NOT DETECTED Final   Coronavirus OC43 NOT DETECTED NOT DETECTED Final    Metapneumovirus NOT DETECTED NOT DETECTED Final   Rhinovirus / Enterovirus NOT DETECTED NOT DETECTED Final   Influenza A H1 2009 DETECTED (A) NOT DETECTED Final   Influenza B NOT DETECTED  NOT DETECTED Final   Parainfluenza Virus 1 NOT DETECTED NOT DETECTED Final   Parainfluenza Virus 2 NOT DETECTED NOT DETECTED Final   Parainfluenza Virus 3 NOT DETECTED NOT DETECTED Final   Parainfluenza Virus 4 NOT DETECTED NOT DETECTED Final   Respiratory Syncytial Virus NOT DETECTED NOT DETECTED Final   Bordetella pertussis NOT DETECTED NOT DETECTED Final   Bordetella Parapertussis NOT DETECTED NOT DETECTED Final   Chlamydophila pneumoniae NOT DETECTED NOT DETECTED Final   Mycoplasma pneumoniae NOT DETECTED NOT DETECTED Final    Comment: Performed at [facility], [address], [place], [place] [address]   Radiology Studies: No results found.  Scheduled Meds:  enoxaparin (LOVENOX) injection  80 mg Subcutaneous Q24H   insulin aspart  0-20 Units Subcutaneous TID WC   insulin aspart  0-5 Units Subcutaneous QHS   insulin glargine-yfgn  25 Units Subcutaneous Daily   ipratropium-albuterol  3 mL Nebulization Once   ipratropium-albuterol  3 mL Nebulization BID   methylPREDNISolone (SOLU-MEDROL) injection  60 mg Intravenous Q12H   mometasone-formoterol  2 puff Inhalation BID   potassium chloride  40 mEq Oral BID   rosuvastatin  20 mg Oral Daily   spironolactone  25 mg Oral Daily   Continuous Infusions:     LOS: 4 days    Time spent: 35 mins    Willeen Niece, MD [facility]   If 7PM-7AM, please contact night-coverage

## 2023-07-15 NOTE — Progress Notes (Signed)
   07/15/23 2137  BiPAP/CPAP/SIPAP  Reason BIPAP/CPAP not in use Non-compliant (refused)

## 2023-07-15 NOTE — Progress Notes (Signed)
Nutrition Education Note  RD consulted for nutrition education per patient request. Upon meeting with patient, she would like weight loss education. Reports she is getting married in two weeks and would like to change the way she eats," start exercising more, and tracking her intake.  She currently takes in one meal per day, which is often fried and at 11PM in the evening. States wedding planning and motherhood are main barriers to lifestyle modification.   Body mass index is 68.13 kg/m. Pt meets criteria for morbid obesity based with current BMI.  RD provided "Weight Loss Tips" handout from the Academy of Nutrition and Dietetics. Emphasized the importance of serving sizes and provided examples of correct portions of common foods. Discussed importance of controlled and consistent intake throughout the day. Provided examples of ways to balance meals/snacks and encouraged intake of high-fiber, whole grain complex carbohydrates. Emphasized the importance of hydration with calorie-free beverages and limiting sugar-sweetened beverages. Encouraged pt to discuss physical activity options with physician. Teach back method used.  Expect questionable compliance.  Upon further discussion, patient interested in speaking to her doctor about going back on Ozempic for weight loss. Also brought up bariatric surgery and diet pills. Reinforced that this writer is unable to offer any prescription for weight loss-related medications, and would suggest lifestyle adjustments to meet goal.  Current diet order is heart healthy/CHO modified, and patient is consuming approximately 100% of meals at this time. Labs and medications reviewed. No further nutrition interventions warranted at this time. RD contact information provided. If additional nutrition issues arise, please re-consult RD.  Myrtie Cruise MS, RD, LDN Registered Dietitian Clinical Nutrition RD Inpatient Contact Info in Amion

## 2023-07-15 NOTE — Plan of Care (Signed)
  Problem: Coping: Goal: Ability to adjust to condition or change in health will improve Outcome: Progressing   Problem: Skin Integrity: Goal: Risk for impaired skin integrity will decrease Outcome: Progressing   Problem: Metabolic: Goal: Ability to maintain appropriate glucose levels will improve Outcome: Not Progressing

## 2023-07-15 NOTE — Progress Notes (Signed)
Mobility Specialist Progress Note:   07/15/23 1430  Mobility  Activity Ambulated with assistance in hallway  Level of Assistance Standby assist, set-up cues, supervision of patient - no hands on  Assistive Device None  Distance Ambulated (ft) 150 ft  Activity Response Tolerated well  Mobility Referral Yes  Mobility visit 1 Mobility  Mobility Specialist Start Time (ACUTE ONLY) 1430  Mobility Specialist Stop Time (ACUTE ONLY) 1445  Mobility Specialist Time Calculation (min) (ACUTE ONLY) 15 min   Pre Mobility: SpO2 95% on RA Post Mobility: SpO2 89-92% on RA  Pt agreeable to mobility session. Required no physical assistance to ambulate. Audible SOB noted as distance increased. Pt back in bed with all needs met.   Addison Lank Mobility Specialist Please contact via SecureChat or  Rehab office at [phone]

## 2023-07-15 NOTE — TOC Progression Note (Signed)
Transition of Care [facility]) - Progression Note    Patient Details  Name: Yvette Gibbs MRN: [id number] Date of Birth: [birth year]  Transition [facility]) CM/SW Contact  Michaela Corner, [place] Phone Number: 07/15/2023, 2:28 PM  Clinical Narrative:  Per chart review, pt remains inpatient appropriate.    Social Determinants of Health (SDOH) Interventions SDOH Screenings   Food Insecurity: Food Insecurity Present (07/12/2023)  Housing: High Risk (07/12/2023)  Transportation Needs: Unmet Transportation Needs (07/12/2023)  Utilities: Not At Risk (07/12/2023)  Depression (PHQ2-9): High Risk (07/01/2022)  Financial Resource Strain: Low Risk  (05/05/2021)  Tobacco Use: Medium Risk (07/11/2023)    Readmission Risk Interventions    11/27/2022   12:24 PM 12/21/2020   11:54 AM  Readmission Risk Prevention Plan  Transportation Screening Complete Complete  HRI or Home Care Consult Complete   Social Work Consult for Recovery Care Planning/Counseling Complete   Palliative Care Screening Not Applicable   Medication Review Oceanographer) Complete Complete  HRI or Home Care Consult  Complete  SW Recovery Care/Counseling Consult  Complete  Palliative Care Screening  Not Applicable  Skilled Nursing Facility  Not Applicable

## 2023-07-16 DIAGNOSIS — J9601 Acute respiratory failure with hypoxia: Secondary | ICD-10-CM | POA: Diagnosis not present

## 2023-07-16 LAB — GLUCOSE, CAPILLARY
Glucose-Capillary: 242 mg/dL — ABNORMAL HIGH (ref 70–99)
Glucose-Capillary: 212 mg/dL — ABNORMAL HIGH (ref 70–99)

## 2023-07-16 LAB — CULTURE, BLOOD (ROUTINE X 2): Culture: NO GROWTH

## 2023-07-16 LAB — CBC
HCT: 36.8 % (ref 36.0–46.0)
Hemoglobin: 12.1 g/dL (ref 12.0–15.0)
MCH: 28.6 pg (ref 26.0–34.0)
MCHC: 32.9 g/dL (ref 30.0–36.0)
MCV: 87 fL (ref 80.0–100.0)
Platelets: 259 10*3/uL (ref 150–400)
RBC: 4.23 MIL/uL (ref 3.87–5.11)
RDW: 12.3 % (ref 11.5–15.5)
WBC: 6.5 10*3/uL (ref 4.0–10.5)
nRBC: 0 % (ref 0.0–0.2)

## 2023-07-16 LAB — BASIC METABOLIC PANEL WITH GFR
Anion gap: 8 (ref 5–15)
BUN: 14 mg/dL (ref 6–20)
CO2: 25 mmol/L (ref 22–32)
Calcium: 9.3 mg/dL (ref 8.9–10.3)
Chloride: 101 mmol/L (ref 98–111)
Creatinine, Ser: 0.6 mg/dL (ref 0.44–1.00)
GFR, Estimated: >60 mL/min
Glucose, Bld: 266 mg/dL — ABNORMAL HIGH (ref 70–99)
Potassium: 4.3 mmol/L (ref 3.5–5.1)
Sodium: 134 mmol/L — ABNORMAL LOW (ref 135–145)

## 2023-07-16 MED ORDER — BUDESONIDE-FORMOTEROL FUMARATE 160-4.5 MCG/ACT IN AERO: 2.0000 | INHALATION_SPRAY | Freq: Two times a day (BID) | RESPIRATORY_TRACT | 0 refills | Status: AC

## 2023-07-16 MED ORDER — INSULIN ASPART 100 UNIT/ML IJ SOLN: 0.0000 [IU] | INTRAMUSCULAR | Status: DC

## 2023-07-16 MED ORDER — LOSARTAN POTASSIUM 25 MG PO TABS: 25.0000 mg | ORAL_TABLET | Freq: Every day | ORAL | 0 refills | Status: DC

## 2023-07-16 MED ORDER — SPIRONOLACTONE 25 MG PO TABS: 25.0000 mg | ORAL_TABLET | Freq: Every day | ORAL | 0 refills | Status: DC

## 2023-07-16 MED ORDER — ROSUVASTATIN CALCIUM 20 MG PO TABS: 20.0000 mg | ORAL_TABLET | Freq: Every day | ORAL | 0 refills | Status: DC

## 2023-07-16 MED ORDER — GUAIFENESIN-DM 100-10 MG/5ML PO SYRP: 5.0000 mL | ORAL_SOLUTION | ORAL | 0 refills | Status: AC | PRN

## 2023-07-16 MED ORDER — LANTUS SOLOSTAR 100 UNIT/ML ~~LOC~~ SOPN: 25.0000 [IU] | PEN_INJECTOR | Freq: Every day | SUBCUTANEOUS | 0 refills | Status: AC

## 2023-07-16 NOTE — TOC Transition Note (Addendum)
Transition of Care [facility]) - Discharge Note   Patient Details  Name: Yvette Gibbs MRN: [id number] Date of Birth: Nov 29, 1990  Transition of Care [facility]) CM/SW Contact:  Lockie Pares, RN Phone Number: 07/16/2023, 11:05 AM   Clinical Narrative:     Patient discharging home today, SDOH screenings addressed with social work. Cab voucher given to patient as she cannot get a hold of anyone to pick her up  No further needs identified. TOC signing off  Final next level of care: Home/Self Care Barriers to Discharge: No Barriers Identified   Patient Goals and CMS Choice            Discharge Placement             Home self care          Discharge Plan and Services Additional resources added to the After Visit Summary for                                       Social Drivers of Health (SDOH) Interventions SDOH Screenings   Food Insecurity: Food Insecurity Present (07/12/2023)  Housing: High Risk (07/12/2023)  Transportation Needs: Unmet Transportation Needs (07/12/2023)  Utilities: Not At Risk (07/12/2023)  Depression (PHQ2-9): High Risk (07/01/2022)  Financial Resource Strain: Low Risk  (05/05/2021)  Tobacco Use: Medium Risk (07/11/2023)     Readmission Risk Interventions    11/27/2022   12:24 PM 12/21/2020   11:54 AM  Readmission Risk Prevention Plan  Transportation Screening Complete Complete  HRI or Home Care Consult Complete   Social Work Consult for Recovery Care Planning/Counseling Complete   Palliative Care Screening Not Applicable   Medication Review Oceanographer) Complete Complete  HRI or Home Care Consult  Complete  SW Recovery Care/Counseling Consult  Complete  Palliative Care Screening  Not Applicable  Skilled Nursing Facility  Not Applicable

## 2023-07-16 NOTE — Discharge Summary (Signed)
Physician Discharge Summary  Yvette Gibbs ZOX:[id number] DOB: 06-06-1991 DOA: 07/11/2023  PCP: Grayce Sessions, NP  Admit date: 07/11/2023  Discharge date: 07/16/2023  Admitted From: Home  Disposition: Home.  Recommendations for Outpatient Follow-up:  Follow up with PCP in 1-2 weeks. Please obtain BMP/CBC in one week. Advised to be compliant with her medications. Advised take losartan for hypertension and Crestor for hyperlipidemia.  Home Health: None Equipment/Devices:None  Discharge Condition: Stable CODE STATUS:Full code Diet recommendation: Heart Healthy  Brief [facility] Course: This 33 yrs old female with PMH significant for severe morbid obesity BMI 70, OSA on BiPAP, asthma, DM2, HTN, chronic HFpEF, chronic depression, GERD who presents to ED with complaints of shortness of breath, progressively worsening for the past 2 weeks.  She also reports nonproductive cough and diffuse wheezing.  Early on in her illness she had nausea vomiting and diarrhea which have now resolved.  Cough and audible wheezing persist.  She received bronchodilators en route via EMS. She was hypoxic requiring supplemental oxygen.  At baseline she does not use oxygen.  CTA chest ruled out pulmonary embolism however showed subsegmental mucous plugging in the left lower lobe and interval development of new patchy groundglass opacity in the left lower lobe concerning for pneumonia.  Patient was started on empiric antibiotics ceftriaxone and Zithromax.  Patient was admitted for further evaluation. Patient was continued on Solu-Medrol and scheduled and as needed nebulized bronchodilators.  Patient also continued on antibiotics.  Subsequently she has made significant improvement and felt much better and ambulated well. Patient wants to be discharged.  Patient is being discharged home.   Discharge Diagnoses:  Principal Problem:   Acute hypoxic respiratory failure (HCC) Active Problems:   Acute asthma  exacerbation   HTN (hypertension)   Diabetes mellitus type 2, insulin dependent (HCC)   OSA (obstructive sleep apnea)   Class 3 obesity   Obesity hypoventilation syndrome (HCC)   NAFLD (nonalcoholic fatty liver disease)  Acute asthma exacerbation: Patient reports still feeling very short of breath with ambulation. Patient was given magnesium sulfate in the ED. Troponin WNL , BNP WNL , CTA chest negative for PE. Continue breathing treatment and Solu-Medrol. Solu-Medrol transitioned to prednisone. Patient continued to have shortness of breath and wheezing. She is placed back on Solu-Medrol 60 every 12 hours for 1 day. She completed 5 days of IV steroids.   Community-acquired pneumonia: No leukocytosis or documented fever . CTA with possible infiltrate, and patient reports 2 weeks of symptoms. Covid/flu/rsv neg Continue ceftriaxone/azithromycin x 5 days. RVP shows influenza A+ She completed 5 days of antibiotics.   Acute hypoxic respiratory failure: Likely due to influenza A and asthma exacerbation. Spo2 80s on arrival, normalized with o2 supplementation. Continue supplemental oxygen and wean as tolerated. She successfully weaned down to room air.   OSA/OHS: No hypercarbia noted on vbg Continue home bipap at bedtime.   DM type II: Hb A1c in the 7s, CBG 400s, started on sensitive sliding scale  Continue Semglee 25 for home lantus 25 Switched ssi to resistant   Essential HTN: BP appropriate. Continue  spironolactone, statin.   Hypophosphatemia / Hypomagnesemia: Hypokalemia : Replaced.  Continue to monitor  Discharge Instructions  Discharge Instructions     Call MD for:  difficulty breathing, headache or visual disturbances   Complete by: As directed    Call MD for:  persistant dizziness or light-headedness   Complete by: As directed    Call MD for:  persistant nausea and vomiting  Complete by: As directed    Diet - low sodium heart healthy   Complete by: As  directed    Diet Carb Modified   Complete by: As directed    Discharge instructions   Complete by: As directed    Advised to follow-up with primary care physician in 1 week. Advised to be compliant with her medications. Advised take losartan for hypertension and Crestor for hyperlipidemia.   Increase activity slowly   Complete by: As directed       Allergies as of 07/16/2023       Reactions   Shellfish Allergy Itching, Rash   Bee Pollen Other (See Comments)   Sneezing and eye burning   Fish Allergy Itching, Rash   Metformin And Related Itching, Rash   Zestril [lisinopril] Rash, Other (See Comments)   Diaphoresis         Medication List     TAKE these medications    acetaminophen 500 MG tablet Commonly known as: TYLENOL Take 1,000 mg by mouth every 6 (six) hours as needed for mild pain (pain score 1-3), fever or headache.   albuterol 108 (90 Base) MCG/ACT inhaler Commonly known as: VENTOLIN HFA Inhale 2 puffs into the lungs every 6 (six) hours as needed for wheezing or shortness of breath (Do not use in addition to albuterol nebulizer).   albuterol (2.5 MG/3ML) 0.083% nebulizer solution Commonly known as: PROVENTIL Use 1 vial (2.5 mg total) by nebulization every 4 (four) hours as needed for wheezing or shortness of breath. Do not use in addition to albuterol rescue inhaler   budesonide-formoterol 160-4.5 MCG/ACT inhaler Commonly known as: Symbicort Inhale 2 puffs into the lungs 2 (two) times daily.   cyclobenzaprine 5 MG tablet Commonly known as: FLEXERIL Take 1 tablet (5 mg total) by mouth 3 (three) times daily as needed for muscle spasms.   guaiFENesin-dextromethorphan 100-10 MG/5ML syrup Commonly known as: ROBITUSSIN DM Take 5 mLs by mouth every 4 (four) hours as needed for cough.   ibuprofen 200 MG tablet Commonly known as: ADVIL Take 400 mg by mouth every 6 (six) hours as needed for mild pain (pain score 1-3), headache or fever.   Lantus SoloStar 100  UNIT/ML Solostar Pen Generic drug: insulin glargine Inject 25 Units into the skin daily.   losartan 25 MG tablet Commonly known as: Cozaar Take 1 tablet (25 mg total) by mouth daily.   Ozempic (0.25 or 0.5 MG/DOSE) 2 MG/3ML Sopn Generic drug: Semaglutide(0.25 or 0.5MG /DOS) Inject 0.25 mg into the skin once a week. For 4 weeks. Then, increase to 0.5 mg into the skin once a week thereafter.   rosuvastatin 20 MG tablet Commonly known as: CRESTOR Take 1 tablet (20 mg total) by mouth daily.   spironolactone 25 MG tablet Commonly known as: Aldactone Take 1 tablet (25 mg total) by mouth daily. Start taking on: July 17, 2023        Follow-up Information     Grayce Sessions, NP. Go on 07/20/2023.   Specialty: Internal Medicine Why: @1 :30pm Contact information: [address] [place] [address] [phone]         Grayce Sessions, NP .   Specialty: Internal Medicine Contact information: [address] [place] [address] [phone]                Allergies  Allergen Reactions   Shellfish Allergy Itching and Rash   Bee Pollen Other (See Comments)    Sneezing  and eye burning   Fish Allergy Itching and Rash   Metformin And Related Itching and Rash   Zestril [Lisinopril] Rash and Other (See Comments)    Diaphoresis     Consultations: None   Procedures/Studies: CT Angio Chest PE W and/or Wo Contrast Result Date: 07/11/2023 CLINICAL DATA:  Two-week history of worsening shortness of breath associated with chest pain and cough. Abdominal pain and emesis EXAM: CT ANGIOGRAPHY CHEST CT ABDOMEN AND PELVIS WITH CONTRAST TECHNIQUE: Multidetector CT imaging of the chest was performed using the standard protocol during bolus administration of intravenous contrast. Multiplanar CT image reconstructions and MIPs were obtained to evaluate the vascular anatomy. Multidetector CT imaging of the abdomen and pelvis was performed using the  standard protocol during bolus administration of intravenous contrast. RADIATION DOSE REDUCTION: This exam was performed according to the departmental dose-optimization program which includes automated exposure control, adjustment of the mA and/or kV according to patient size and/or use of iterative reconstruction technique. CONTRAST:  OMNIPAQUE IOHEXOL 350 MG/ML SOLN COMPARISON:  CT chest dated 04/17/2023, CT abdomen and pelvis dated 06/10/2022 FINDINGS: CTA CHEST FINDINGS Cardiovascular: The study is adequate for the evaluation of pulmonary embolism to the level of the proximal segmental arteries. Decreased sensitivity and specificity for detailed findings due to photon starvation and contrast bolus timing. There are no filling defects in the central, lobar, or proximal segmental pulmonary artery branches to suggest acute pulmonary embolism. Mild dilation of main pulmonary artery measuring 3.4 cm. Normal heart size. No significant pericardial fluid/thickening. Mediastinum/Nodes: Imaged thyroid gland without nodules meeting criteria for imaging follow-up by size. Normal esophagus. No pathologically enlarged axillary, supraclavicular, mediastinal, or hilar lymph nodes. Lungs/Pleura: The central airways are patent. AP effacement of the trachea and bilateral bronchi. Subsegmental mucous plugging in the left lower lobe. Previously noted left lower lobe ground-glass nodules are no longer seen. Interval development of new patchy ground-glass opacities in the left lower lobe. Background of diffuse mosaic attenuation. No pneumothorax. No pleural effusion. Musculoskeletal: No acute or abnormal lytic or blastic osseous lesions. Review of the MIP images confirms the above findings. CT ABDOMEN and PELVIS FINDINGS Hepatobiliary: No focal hepatic lesions. No intra or extrahepatic biliary ductal dilation. Normal gallbladder. Pancreas: No focal lesions or main ductal dilation. Spleen: Normal in size without focal  abnormality. Adrenals/Urinary Tract: No adrenal nodules. No suspicious renal mass or hydronephrosis. Punctate nonobstructing right interpolar stone. No focal bladder wall thickening. Stomach/Bowel: Normal appearance of the stomach. No evidence of bowel wall thickening, distention, or inflammatory changes. Appendectomy. Vascular/Lymphatic: No significant vascular findings are present. No enlarged abdominal or pelvic lymph nodes. Reproductive: No adnexal masses. Other: No free fluid, fluid collection, or free air. Musculoskeletal: No acute or abnormal lytic or blastic osseous lesions. 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 IMPRESSION: 1. No evidence of pulmonary embolism to the level of the proximal segmental arteries. Vessels distal to this level are suboptimally evaluated due to photon starvation artifact and contrast bolus timing. 2. 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 3. Subsegmental mucous plugging in the left lower lobe and interval development of new patchy ground-glass opacities in the left lower lobe, likely infectious/inflammatory. 4. Background of diffuse mosaic attenuation, which can be seen in the setting of small airways disease. 5. Mild dilation of main pulmonary artery measuring 3.4 cm, which can be seen in the setting of pulmonary hypertension. 6. No acute abdominopelvic findings. Electronically Signed   By: Milus Height.D.  On: 07/11/2023 19:39   CT ABDOMEN PELVIS W CONTRAST Result Date: 07/11/2023 CLINICAL DATA:  Two-week history of worsening shortness of breath associated with chest pain and cough. Abdominal pain and emesis EXAM: CT ANGIOGRAPHY CHEST CT ABDOMEN AND PELVIS WITH CONTRAST TECHNIQUE: Multidetector CT imaging of the chest was performed using the standard protocol during bolus administration of intravenous contrast. Multiplanar CT image reconstructions and MIPs were obtained to evaluate the vascular anatomy.  Multidetector CT imaging of the abdomen and pelvis was performed using the standard protocol during bolus administration of intravenous contrast. RADIATION DOSE REDUCTION: This exam was performed according to the departmental dose-optimization program which includes automated exposure control, adjustment of the mA and/or kV according to patient size and/or use of iterative reconstruction technique. CONTRAST:  OMNIPAQUE IOHEXOL 350 MG/ML SOLN COMPARISON:  CT chest dated 04/17/2023, CT abdomen and pelvis dated 06/10/2022 FINDINGS: CTA CHEST FINDINGS Cardiovascular: The study is adequate for the evaluation of pulmonary embolism to the level of the proximal segmental arteries. Decreased sensitivity and specificity for detailed findings due to photon starvation and contrast bolus timing. There are no filling defects in the central, lobar, or proximal segmental pulmonary artery branches to suggest acute pulmonary embolism. Mild dilation of main pulmonary artery measuring 3.4 cm. Normal heart size. No significant pericardial fluid/thickening. Mediastinum/Nodes: Imaged thyroid gland without nodules meeting criteria for imaging follow-up by size. Normal esophagus. No pathologically enlarged axillary, supraclavicular, mediastinal, or hilar lymph nodes. Lungs/Pleura: The central airways are patent. AP effacement of the trachea and bilateral bronchi. Subsegmental mucous plugging in the left lower lobe. Previously noted left lower lobe ground-glass nodules are no longer seen. Interval development of new patchy ground-glass opacities in the left lower lobe. Background of diffuse mosaic attenuation. No pneumothorax. No pleural effusion. Musculoskeletal: No acute or abnormal lytic or blastic osseous lesions. Review of the MIP images confirms the above findings. CT ABDOMEN and PELVIS FINDINGS Hepatobiliary: No focal hepatic lesions. No intra or extrahepatic biliary ductal dilation. Normal gallbladder. Pancreas: No focal  lesions or main ductal dilation. Spleen: Normal in size without focal abnormality. Adrenals/Urinary Tract: No adrenal nodules. No suspicious renal mass or hydronephrosis. Punctate nonobstructing right interpolar stone. No focal bladder wall thickening. Stomach/Bowel: Normal appearance of the stomach. No evidence of bowel wall thickening, distention, or inflammatory changes. Appendectomy. Vascular/Lymphatic: No significant vascular findings are present. No enlarged abdominal or pelvic lymph nodes. Reproductive: No adnexal masses. Other: No free fluid, fluid collection, or free air. Musculoskeletal: No acute or abnormal lytic or blastic osseous lesions. 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 IMPRESSION: 1. No evidence of pulmonary embolism to the level of the proximal segmental arteries. Vessels distal to this level are suboptimally evaluated due to photon starvation artifact and contrast bolus timing. 2. 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 3. Subsegmental mucous plugging in the left lower lobe and interval development of new patchy ground-glass opacities in the left lower lobe, likely infectious/inflammatory. 4. Background of diffuse mosaic attenuation, which can be seen in the setting of small airways disease. 5. Mild dilation of main pulmonary artery measuring 3.4 cm, which can be seen in the setting of pulmonary hypertension. 6. No acute abdominopelvic findings. Electronically Signed   By: Agustin Cree M.D.   On: 07/11/2023 19:39   DG Chest 2 View Result Date: 07/11/2023 CLINICAL DATA:  Two-week history of chest pain and congestion EXAM: CHEST - 2 VIEW COMPARISON:  Chest radiograph dated 04/17/2023 FINDINGS: Low lung volumes with  bronchovascular crowding. Asymmetric peripheral right lower lung hazy and patchy opacity. No pleural effusion or pneumothorax. The heart size and mediastinal contours are within normal limits. No acute osseous  abnormality. IMPRESSION: 1. Asymmetric peripheral right lower lung hazy and patchy opacity, which may represent atelectasis or pneumonia. 2.  Low lung volumes with bronchovascular crowding. Electronically Signed   By: Agustin Cree M.D.   On: 07/11/2023 13:18     Subjective: Patient was seen and examined at bedside.  Overnight events noted.   Patient reports doing much better, She wants to be discharged.  Patient is being discharged home.  Discharge Exam: Vitals:   07/16/23 0730 07/16/23 0855  BP: (!) 143/88   Pulse: 70   Resp: 19   Temp: 97.9 F (36.6 C)   SpO2:  94%   Vitals:   07/16/23 0005 07/16/23 0505 07/16/23 0730 07/16/23 0855  BP: (!) 163/92 (!) 141/89 (!) 143/88   Pulse: 68 71 70   Resp:  20 19   Temp:  98.8 F (37.1 C) 97.9 F (36.6 C)   TempSrc:  Oral Oral   SpO2: 99% 92%  94%  Weight:  (!) 170.4 kg    Height:        General: Pt is alert, awake, not in acute distress Cardiovascular: RRR, S1/S2 +, no rubs, no gallops Respiratory: CTA bilaterally, no wheezing, no rhonchi Abdominal: Soft, NT, ND, bowel sounds + Extremities: no edema, no cyanosis    The results of significant diagnostics from this hospitalization (including imaging, microbiology, ancillary and laboratory) are listed below for reference.     Microbiology: Recent Results (from the past 240 hours)  Resp panel by RT-PCR (RSV, Flu A&B, Covid) Anterior Nasal Swab     Status: None   Collection Time: 07/11/23 12:03 PM   Specimen: Anterior Nasal Swab  Result Value Ref Range Status   SARS Coronavirus 2 by RT PCR NEGATIVE NEGATIVE Final   Influenza A by PCR NEGATIVE NEGATIVE Final   Influenza B by PCR NEGATIVE NEGATIVE Final    Comment: (NOTE) The Xpert Xpress SARS-CoV-2/FLU/RSV plus assay is intended as an aid in the diagnosis of influenza from Nasopharyngeal swab specimens and should not be used as a sole basis for treatment. Nasal washings and aspirates are unacceptable for Xpert Xpress  SARS-CoV-2/FLU/RSV testing.  Fact Sheet for Patients: BloggerCourse.com  Fact Sheet for Healthcare Providers: SeriousBroker.it  This test is not yet approved or cleared by the [place] FDA and has been authorized for detection and/or diagnosis of SARS-CoV-2 by FDA under an Emergency Use Authorization (EUA). This EUA will remain in effect (meaning this test can be used) for the duration of the COVID-19 declaration under Section 564(b)(1) of the Act, 21 U.S.C. section 360bbb-3(b)(1), unless the authorization is terminated or revoked.     Resp Syncytial Virus by PCR NEGATIVE NEGATIVE Final    Comment: (NOTE) Fact Sheet for Patients: BloggerCourse.com  Fact Sheet for Healthcare Providers: SeriousBroker.it  This test is not yet approved or cleared by the [place] FDA and has been authorized for detection and/or diagnosis of SARS-CoV-2 by FDA under an Emergency Use Authorization (EUA). This EUA will remain in effect (meaning this test can be used) for the duration of the COVID-19 declaration under Section 564(b)(1) of the Act, 21 U.S.C. section 360bbb-3(b)(1), unless the authorization is terminated or revoked.  Performed at [facility], [address] [place], [place] [address]   Blood culture (routine x 2)  Status: None   Collection Time: 07/11/23  4:07 PM   Specimen: BLOOD LEFT ARM  Result Value Ref Range Status   Specimen Description BLOOD LEFT ARM  Final   Special Requests   Final    BOTTLES DRAWN AEROBIC AND ANAEROBIC Blood Culture results may not be optimal due to an inadequate volume of blood received in culture bottles   Culture   Final    NO GROWTH 5 DAYS Performed at [facility], [address] [place], [place] [address]    Report Status 07/16/2023 FINAL  Final  Blood culture (routine x 2)     Status: Abnormal   Collection Time: 07/11/23   8:50 PM   Specimen: BLOOD RIGHT ARM  Result Value Ref Range Status   Specimen Description BLOOD RIGHT ARM  Final   Special Requests   Final    BOTTLES DRAWN AEROBIC ONLY Blood Culture adequate volume   Culture  Setup Time   Final    GRAM POSITIVE COCCI AEROBIC BOTTLE ONLY CRITICAL RESULT CALLED TO, READ BACK BY AND VERIFIED WITH: PHARMD L. CHEN [id number] @ 1913 FH    Culture (A)  Final    STAPHYLOCOCCUS EPIDERMIDIS THE SIGNIFICANCE OF ISOLATING THIS ORGANISM FROM A SINGLE SET OF BLOOD CULTURES WHEN MULTIPLE SETS ARE DRAWN IS UNCERTAIN. PLEASE NOTIFY THE MICROBIOLOGY DEPARTMENT WITHIN ONE WEEK IF SPECIATION AND SENSITIVITIES ARE REQUIRED. Performed at [facility], [address], [place], [place] [address]    Report Status 07/13/2023 FINAL  Final  Blood Culture ID Panel (Reflexed)     Status: Abnormal   Collection Time: 07/11/23  8:50 PM  Result Value Ref Range Status   Enterococcus faecalis NOT DETECTED NOT DETECTED Final   Enterococcus Faecium NOT DETECTED NOT DETECTED Final   Listeria monocytogenes NOT DETECTED NOT DETECTED Final   Staphylococcus species DETECTED (A) NOT DETECTED Final    Comment: CRITICAL RESULT CALLED TO, READ BACK BY AND VERIFIED WITH: PHARMD L. CHEN [id number] @ 1913 FH    Staphylococcus aureus (BCID) NOT DETECTED NOT DETECTED Final   Staphylococcus epidermidis DETECTED (A) NOT DETECTED Final    Comment: CRITICAL RESULT CALLED TO, READ BACK BY AND VERIFIED WITH: PHARMD L. CHEN [id number] @ 1913 FH    Staphylococcus lugdunensis NOT DETECTED NOT DETECTED Final   Streptococcus species NOT DETECTED NOT DETECTED Final   Streptococcus agalactiae NOT DETECTED NOT DETECTED Final   Streptococcus pneumoniae NOT DETECTED NOT DETECTED Final   Streptococcus pyogenes NOT DETECTED NOT DETECTED Final   A.calcoaceticus-baumannii NOT DETECTED NOT DETECTED Final   Bacteroides fragilis NOT DETECTED NOT DETECTED Final   Enterobacterales NOT DETECTED NOT DETECTED Final    Enterobacter cloacae complex NOT DETECTED NOT DETECTED Final   Escherichia coli NOT DETECTED NOT DETECTED Final   Klebsiella aerogenes NOT DETECTED NOT DETECTED Final   Klebsiella oxytoca NOT DETECTED NOT DETECTED Final   Klebsiella pneumoniae NOT DETECTED NOT DETECTED Final   Proteus species NOT DETECTED NOT DETECTED Final   Salmonella species NOT DETECTED NOT DETECTED Final   Serratia marcescens NOT DETECTED NOT DETECTED Final   Haemophilus influenzae NOT DETECTED NOT DETECTED Final   Neisseria meningitidis NOT DETECTED NOT DETECTED Final   Pseudomonas aeruginosa NOT DETECTED NOT DETECTED Final   Stenotrophomonas maltophilia NOT DETECTED NOT DETECTED Final   Candida albicans NOT DETECTED NOT DETECTED Final   Candida auris NOT DETECTED NOT DETECTED Final   Candida glabrata NOT DETECTED NOT DETECTED Final   Candida krusei NOT DETECTED  NOT DETECTED Final   Candida parapsilosis NOT DETECTED NOT DETECTED Final   Candida tropicalis NOT DETECTED NOT DETECTED Final   Cryptococcus neoformans/gattii NOT DETECTED NOT DETECTED Final   Methicillin resistance mecA/C NOT DETECTED NOT DETECTED Final    Comment: Performed at [facility], [address] [place], [place] [address]  Respiratory (~20 pathogens) panel by PCR     Status: Abnormal   Collection Time: 07/12/23  9:14 AM   Specimen: Nasopharyngeal Swab; Respiratory  Result Value Ref Range Status   Adenovirus NOT DETECTED NOT DETECTED Final   Coronavirus 229E NOT DETECTED NOT DETECTED Final    Comment: (NOTE) The Coronavirus on the Respiratory Panel, DOES NOT test for the novel  Coronavirus (2019 nCoV)    Coronavirus HKU1 NOT DETECTED NOT DETECTED Final   Coronavirus NL63 NOT DETECTED NOT DETECTED Final   Coronavirus OC43 NOT DETECTED NOT DETECTED Final   Metapneumovirus NOT DETECTED NOT DETECTED Final   Rhinovirus / Enterovirus NOT DETECTED NOT DETECTED Final   Influenza A H1 2009 DETECTED (A) NOT DETECTED Final   Influenza B NOT  DETECTED NOT DETECTED Final   Parainfluenza Virus 1 NOT DETECTED NOT DETECTED Final   Parainfluenza Virus 2 NOT DETECTED NOT DETECTED Final   Parainfluenza Virus 3 NOT DETECTED NOT DETECTED Final   Parainfluenza Virus 4 NOT DETECTED NOT DETECTED Final   Respiratory Syncytial Virus NOT DETECTED NOT DETECTED Final   Bordetella pertussis NOT DETECTED NOT DETECTED Final   Bordetella Parapertussis NOT DETECTED NOT DETECTED Final   Chlamydophila pneumoniae NOT DETECTED NOT DETECTED Final   Mycoplasma pneumoniae NOT DETECTED NOT DETECTED Final    Comment: Performed at [facility], [address] [place], [place] [address]     Labs: BNP (last 3 results) Recent Labs    11/22/22 2202 04/17/23 1652 07/11/23 1607  BNP 18.0 39.1 11.9   Basic Metabolic Panel: Recent Labs  Lab 07/11/23 1203 07/12/23 0350 07/14/23 0804 07/15/23 1114 07/16/23 0255  NA 135 134* 137 138 134*  K 4.1 4.1 3.4* 3.5 4.3  CL 100 103 100 99 101  CO2 25 19* 25 28 25   GLUCOSE 177* 411* 178* 193* 266*  BUN 9 9 11 11 14   CREATININE 0.70 0.84 0.66 0.67 0.60  CALCIUM 9.4 8.8* 8.7* 9.0 9.3  MG  --  1.6* 1.9  --   --   PHOS  --  1.5* 3.6  --   --    Liver Function Tests: Recent Labs  Lab 07/11/23 1203  AST 24  ALT 28  ALKPHOS 69  BILITOT 0.2  PROT 7.2  ALBUMIN 3.4*   No results for input(s): "LIPASE", "AMYLASE" in the last 168 hours. No results for input(s): "AMMONIA" in the last 168 hours. CBC: Recent Labs  Lab 07/11/23 1203 07/12/23 0350 07/14/23 0804 07/16/23 0255  WBC 5.6 4.1 4.9 6.5  HGB 13.0 12.1 11.4* 12.1  HCT 40.7 38.0 36.3 36.8  MCV 90.2 91.3 91.2 87.0  PLT 248 233 234 259   Cardiac Enzymes: No results for input(s): "CKTOTAL", "CKMB", "CKMBINDEX", "TROPONINI" in the last 168 hours. BNP: Invalid input(s): "POCBNP" CBG: Recent Labs  Lab 07/15/23 1043 07/15/23 1601 07/15/23 2103 07/16/23 0350 07/16/23 0727  GLUCAP 185* 322* 326* 242* 212*   D-Dimer No results for  input(s): "DDIMER" in the last 72 hours. Hgb A1c No results for input(s): "HGBA1C" in the last 72 hours. Lipid Profile No results for input(s): "CHOL", "HDL", "LDLCALC", "TRIG", "CHOLHDL", "LDLDIRECT"  in the last 72 hours. Thyroid function studies No results for input(s): "TSH", "T4TOTAL", "T3FREE", "THYROIDAB" in the last 72 hours.  Invalid input(s): "FREET3" Anemia work up No results for input(s): "VITAMINB12", "FOLATE", "FERRITIN", "TIBC", "IRON", "RETICCTPCT" in the last 72 hours. Urinalysis    Component Value Date/Time   COLORURINE YELLOW 06/09/2022 2155   APPEARANCEUR CLOUDY (A) 06/09/2022 2155   LABSPEC 1.019 06/09/2022 2155   PHURINE 7.0 06/09/2022 2155   GLUCOSEU NEGATIVE 06/09/2022 2155   HGBUR LARGE (A) 06/09/2022 2155   BILIRUBINUR NEGATIVE 06/09/2022 2155   KETONESUR NEGATIVE 06/09/2022 2155   PROTEINUR >=300 (A) 06/09/2022 2155   UROBILINOGEN 0.2 09/04/2014 1001   NITRITE NEGATIVE 06/09/2022 2155   LEUKOCYTESUR NEGATIVE 06/09/2022 2155   Sepsis Labs Recent Labs  Lab 07/11/23 1203 07/12/23 0350 07/14/23 0804 07/16/23 0255  WBC 5.6 4.1 4.9 6.5   Microbiology Recent Results (from the past 240 hours)  Resp panel by RT-PCR (RSV, Flu A&B, Covid) Anterior Nasal Swab     Status: None   Collection Time: 07/11/23 12:03 PM   Specimen: Anterior Nasal Swab  Result Value Ref Range Status   SARS Coronavirus 2 by RT PCR NEGATIVE NEGATIVE Final   Influenza A by PCR NEGATIVE NEGATIVE Final   Influenza B by PCR NEGATIVE NEGATIVE Final    Comment: (NOTE) The Xpert Xpress SARS-CoV-2/FLU/RSV plus assay is intended as an aid in the diagnosis of influenza from Nasopharyngeal swab specimens and should not be used as a sole basis for treatment. Nasal washings and aspirates are unacceptable for Xpert Xpress SARS-CoV-2/FLU/RSV testing.  Fact Sheet for Patients: BloggerCourse.com  Fact Sheet for Healthcare  Providers: SeriousBroker.it  This test is not yet approved or cleared by the [place] FDA and has been authorized for detection and/or diagnosis of SARS-CoV-2 by FDA under an Emergency Use Authorization (EUA). This EUA will remain in effect (meaning this test can be used) for the duration of the COVID-19 declaration under Section 564(b)(1) of the Act, 21 U.S.C. section 360bbb-3(b)(1), unless the authorization is terminated or revoked.     Resp Syncytial Virus by PCR NEGATIVE NEGATIVE Final    Comment: (NOTE) Fact Sheet for Patients: BloggerCourse.com  Fact Sheet for Healthcare Providers: SeriousBroker.it  This test is not yet approved or cleared by the [place] FDA and has been authorized for detection and/or diagnosis of SARS-CoV-2 by FDA under an Emergency Use Authorization (EUA). This EUA will remain in effect (meaning this test can be used) for the duration of the COVID-19 declaration under Section 564(b)(1) of the Act, 21 U.S.C. section 360bbb-3(b)(1), unless the authorization is terminated or revoked.  Performed at [facility], [address] [place], [place] [address]   Blood culture (routine x 2)     Status: None   Collection Time: 07/11/23  4:07 PM   Specimen: BLOOD LEFT ARM  Result Value Ref Range Status   Specimen Description BLOOD LEFT ARM  Final   Special Requests   Final    BOTTLES DRAWN AEROBIC AND ANAEROBIC Blood Culture results may not be optimal due to an inadequate volume of blood received in culture bottles   Culture   Final    NO GROWTH 5 DAYS Performed at [facility], [address], [place], [place] [address]    Report Status 07/16/2023 FINAL  Final  Blood culture (routine x 2)     Status: Abnormal   Collection Time: 07/11/23  8:50 PM   Specimen: BLOOD RIGHT ARM  Result Value Ref Range Status   Specimen Description BLOOD RIGHT ARM  Final   Special  Requests   Final    BOTTLES DRAWN AEROBIC ONLY Blood Culture adequate volume   Culture  Setup Time   Final    GRAM POSITIVE COCCI AEROBIC BOTTLE ONLY CRITICAL RESULT CALLED TO, READ BACK BY AND VERIFIED WITH: PHARMD L. CHEN [id number] @ 1913 FH    Culture (A)  Final    STAPHYLOCOCCUS EPIDERMIDIS THE SIGNIFICANCE OF ISOLATING THIS ORGANISM FROM A SINGLE SET OF BLOOD CULTURES WHEN MULTIPLE SETS ARE DRAWN IS UNCERTAIN. PLEASE NOTIFY THE MICROBIOLOGY DEPARTMENT WITHIN ONE WEEK IF SPECIATION AND SENSITIVITIES ARE REQUIRED. Performed at [facility], [address] [place], [place] [address]    Report Status 07/13/2023 FINAL  Final  Blood Culture ID Panel (Reflexed)     Status: Abnormal   Collection Time: 07/11/23  8:50 PM  Result Value Ref Range Status   Enterococcus faecalis NOT DETECTED NOT DETECTED Final   Enterococcus Faecium NOT DETECTED NOT DETECTED Final   Listeria monocytogenes NOT DETECTED NOT DETECTED Final   Staphylococcus species DETECTED (A) NOT DETECTED Final    Comment: CRITICAL RESULT CALLED TO, READ BACK BY AND VERIFIED WITH: PHARMD L. CHEN [id number] @ 1913 FH    Staphylococcus aureus (BCID) NOT DETECTED NOT DETECTED Final   Staphylococcus epidermidis DETECTED (A) NOT DETECTED Final    Comment: CRITICAL RESULT CALLED TO, READ BACK BY AND VERIFIED WITH: PHARMD L. CHEN [id number] @ 1913 FH    Staphylococcus lugdunensis NOT DETECTED NOT DETECTED Final   Streptococcus species NOT DETECTED NOT DETECTED Final   Streptococcus agalactiae NOT DETECTED NOT DETECTED Final   Streptococcus pneumoniae NOT DETECTED NOT DETECTED Final   Streptococcus pyogenes NOT DETECTED NOT DETECTED Final   A.calcoaceticus-baumannii NOT DETECTED NOT DETECTED Final   Bacteroides fragilis NOT DETECTED NOT DETECTED Final   Enterobacterales NOT DETECTED NOT DETECTED Final   Enterobacter cloacae complex NOT DETECTED NOT DETECTED Final   Escherichia coli NOT DETECTED NOT DETECTED Final   Klebsiella  aerogenes NOT DETECTED NOT DETECTED Final   Klebsiella oxytoca NOT DETECTED NOT DETECTED Final   Klebsiella pneumoniae NOT DETECTED NOT DETECTED Final   Proteus species NOT DETECTED NOT DETECTED Final   Salmonella species NOT DETECTED NOT DETECTED Final   Serratia marcescens NOT DETECTED NOT DETECTED Final   Haemophilus influenzae NOT DETECTED NOT DETECTED Final   Neisseria meningitidis NOT DETECTED NOT DETECTED Final   Pseudomonas aeruginosa NOT DETECTED NOT DETECTED Final   Stenotrophomonas maltophilia NOT DETECTED NOT DETECTED Final   Candida albicans NOT DETECTED NOT DETECTED Final   Candida auris NOT DETECTED NOT DETECTED Final   Candida glabrata NOT DETECTED NOT DETECTED Final   Candida krusei NOT DETECTED NOT DETECTED Final   Candida parapsilosis NOT DETECTED NOT DETECTED Final   Candida tropicalis NOT DETECTED NOT DETECTED Final   Cryptococcus neoformans/gattii NOT DETECTED NOT DETECTED Final   Methicillin resistance mecA/C NOT DETECTED NOT DETECTED Final    Comment: Performed at [facility], [address], [place], [place] [address]  Respiratory (~20 pathogens) panel by PCR     Status: Abnormal   Collection Time: 07/12/23  9:14 AM   Specimen: Nasopharyngeal Swab; Respiratory  Result Value Ref Range Status   Adenovirus NOT DETECTED NOT DETECTED Final   Coronavirus 229E NOT DETECTED NOT DETECTED Final    Comment: (NOTE) The Coronavirus on the Respiratory Panel, DOES NOT test for the novel  Coronavirus (  2019 nCoV)    Coronavirus HKU1 NOT DETECTED NOT DETECTED Final   Coronavirus NL63 NOT DETECTED NOT DETECTED Final   Coronavirus OC43 NOT DETECTED NOT DETECTED Final   Metapneumovirus NOT DETECTED NOT DETECTED Final   Rhinovirus / Enterovirus NOT DETECTED NOT DETECTED Final   Influenza A H1 2009 DETECTED (A) NOT DETECTED Final   Influenza B NOT DETECTED NOT DETECTED Final   Parainfluenza Virus 1 NOT DETECTED NOT DETECTED Final   Parainfluenza Virus 2 NOT DETECTED NOT  DETECTED Final   Parainfluenza Virus 3 NOT DETECTED NOT DETECTED Final   Parainfluenza Virus 4 NOT DETECTED NOT DETECTED Final   Respiratory Syncytial Virus NOT DETECTED NOT DETECTED Final   Bordetella pertussis NOT DETECTED NOT DETECTED Final   Bordetella Parapertussis NOT DETECTED NOT DETECTED Final   Chlamydophila pneumoniae NOT DETECTED NOT DETECTED Final   Mycoplasma pneumoniae NOT DETECTED NOT DETECTED Final    Comment: Performed at [facility], [address] [place], [place] [address]     Time coordinating discharge: Over 30 minutes  SIGNED:   Willeen Niece, MD  [facility] 07/16/2023, 1:49 PM Pager   If 7PM-7AM, please contact night-coverage

## 2023-07-16 NOTE — Plan of Care (Signed)
  Problem: Education: Goal: Ability to describe self-care measures that may prevent or decrease complications (Diabetes Survival Skills Education) will improve Outcome: Progressing Goal: Individualized Educational Video(s) Outcome: Progressing   Problem: Coping: Goal: Ability to adjust to condition or change in health will improve Outcome: Progressing   Problem: Fluid Volume: Goal: Ability to maintain a balanced intake and output will improve Outcome: Progressing   Problem: Health Behavior/Discharge Planning: Goal: Ability to identify and utilize available resources and services will improve Outcome: Progressing Goal: Ability to manage health-related needs will improve Outcome: Progressing   Problem: Metabolic: Goal: Ability to maintain appropriate glucose levels will improve Outcome: Progressing   Problem: Nutritional: Goal: Maintenance of adequate nutrition will improve Outcome: Progressing Goal: Progress toward achieving an optimal weight will improve Outcome: Progressing   Problem: Skin Integrity: Goal: Risk for impaired skin integrity will decrease Outcome: Progressing   Problem: Tissue Perfusion: Goal: Adequacy of tissue perfusion will improve Outcome: Progressing   Problem: Education: Goal: Knowledge of General Education information will improve Description: Including pain rating scale, medication(s)/side effects and non-pharmacologic comfort measures Outcome: Progressing   Problem: Health Behavior/Discharge Planning: Goal: Ability to manage health-related needs will improve Outcome: Progressing   Problem: Clinical Measurements: Goal: Ability to maintain clinical measurements within normal limits will improve Outcome: Progressing Goal: Will remain free from infection Outcome: Progressing Goal: Diagnostic test results will improve Outcome: Progressing Goal: Cardiovascular complication will be avoided Outcome: Progressing   Problem: Nutrition: Goal:  Adequate nutrition will be maintained Outcome: Progressing   Problem: Coping: Goal: Level of anxiety will decrease Outcome: Progressing   Problem: Safety: Goal: Ability to remain free from injury will improve Outcome: Progressing

## 2023-07-16 NOTE — Inpatient Diabetes Management (Signed)
Inpatient Diabetes Program Recommendations  AACE/ADA: New Consensus Statement on Inpatient Glycemic Control (2015)  Target Ranges:  Prepandial:   less than 140 mg/dL      Peak postprandial:   less than 180 mg/dL (1-2 hours)      Critically ill patients:  140 - 180 mg/dL   Lab Results  Component Value Date   GLUCAP 212 (H) 07/16/2023   HGBA1C 7.3 (H) 07/12/2023    Review of Glycemic Control  Latest Reference Range & Units 07/15/23 21:03 07/16/23 03:50 07/16/23 07:27  Glucose-Capillary 70 - 99 mg/dL 086 (H) 578 (H) 469 (H)   Diabetes history: DM  Outpatient Diabetes medications:  Lantus 25 units daily- not taking Ozempic 0.25 mg weekly  Current orders for Inpatient glycemic control:  Solumedrol 120 mg daily Novolog 0-20 units tid with meals and HS Semglee 25 units daily  Inpatient Diabetes Program Recommendations:    Consider increasing Novolog 0-20 units to q 4 hours.   Thanks,  Lorenza Cambridge, RN, BC-ADM Inpatient Diabetes Coordinator Pager [phone]  (8a-5p)

## 2023-07-17 DIAGNOSIS — Z419 Encounter for procedure for purposes other than remedying health state, unspecified: Secondary | ICD-10-CM | POA: Diagnosis not present

## 2023-07-19 ENCOUNTER — Telehealth: Payer: Self-pay

## 2023-07-19 NOTE — Transitions of Care (Post Inpatient/ED Visit) (Signed)
   07/19/2023  Name: Sofya Moustafa MRN: [id number] DOB: 01/27/1991  Today's TOC FU Call Status: Today's TOC FU Call Status:: Unsuccessful Call (1st Attempt) Unsuccessful Call (1st Attempt) Date: 07/19/23  Attempted to reach the patient regarding the most recent Inpatient/ED visit.  Follow Up Plan: Additional outreach attempts will be made to reach the patient to complete the Transitions of Care (Post Inpatient/ED visit) call.   Signature  Robyne Peers, RN

## 2023-07-20 ENCOUNTER — Inpatient Hospital Stay (INDEPENDENT_AMBULATORY_CARE_PROVIDER_SITE_OTHER): Payer: Medicaid Other | Admitting: Primary Care

## 2023-07-20 ENCOUNTER — Telehealth: Payer: Self-pay

## 2023-07-20 DIAGNOSIS — F321 Major depressive disorder, single episode, moderate: Secondary | ICD-10-CM | POA: Diagnosis not present

## 2023-07-20 NOTE — Transitions of Care (Post Inpatient/ED Visit) (Signed)
   07/20/2023  Name: Yvette Gibbs MRN: [id number] DOB: June 06, 1991  Today's TOC FU Call Status: Today's TOC FU Call Status:: Unsuccessful Call (2nd Attempt) Unsuccessful Call (1st Attempt) Date: 07/19/23 Unsuccessful Call (2nd Attempt) Date: 07/20/23  Attempted to reach the patient regarding the most recent Inpatient/ED visit.  Follow Up Plan: Additional outreach attempts will be made to reach the patient to complete the Transitions of Care (Post Inpatient/ED visit) call.    The patient has an appointment with Rosaline Celestia PIETY at [facility] on 07/29/2023.   Signature Slater Diesel, RN

## 2023-07-21 ENCOUNTER — Telehealth: Payer: Self-pay

## 2023-07-21 NOTE — Transitions of Care (Post Inpatient/ED Visit) (Signed)
   07/21/2023  Name: Yvette Gibbs MRN: [id number] DOB: 1990/12/04  Today's TOC FU Call Status: Today's TOC FU Call Status:: Unsuccessful Call (3rd Attempt) Unsuccessful Call (1st Attempt) Date: 07/19/23 Unsuccessful Call (2nd Attempt) Date: 07/20/23 Unsuccessful Call (3rd Attempt) Date: 07/21/23  Attempted to reach the patient regarding the most recent Inpatient/ED visit.  Follow Up Plan: No further outreach attempts will be made at this time. We have been unable to contact the patient.  The patient has an appointment with Rosaline Bohr, NP at [facility] on 07/29/2023.   Signature Slater Diesel, RN

## 2023-07-22 DIAGNOSIS — F321 Major depressive disorder, single episode, moderate: Secondary | ICD-10-CM | POA: Diagnosis not present

## 2023-07-27 DIAGNOSIS — F321 Major depressive disorder, single episode, moderate: Secondary | ICD-10-CM | POA: Diagnosis not present

## 2023-07-28 ENCOUNTER — Telehealth (INDEPENDENT_AMBULATORY_CARE_PROVIDER_SITE_OTHER): Payer: Self-pay | Admitting: Primary Care

## 2023-07-28 DIAGNOSIS — F321 Major depressive disorder, single episode, moderate: Secondary | ICD-10-CM | POA: Diagnosis not present

## 2023-07-28 NOTE — Telephone Encounter (Signed)
Called pt to remind them about atp. VM left with pt.

## 2023-07-29 ENCOUNTER — Inpatient Hospital Stay (INDEPENDENT_AMBULATORY_CARE_PROVIDER_SITE_OTHER): Payer: Medicaid Other | Admitting: Primary Care

## 2023-07-29 ENCOUNTER — Telehealth (INDEPENDENT_AMBULATORY_CARE_PROVIDER_SITE_OTHER): Payer: Self-pay | Admitting: Primary Care

## 2023-07-29 NOTE — Telephone Encounter (Signed)
Called to see if pt was interested in rescheduling apt that was a No Show. Pt's phone number is unavailable.

## 2023-08-11 DIAGNOSIS — F321 Major depressive disorder, single episode, moderate: Secondary | ICD-10-CM | POA: Diagnosis not present

## 2023-08-13 DIAGNOSIS — F321 Major depressive disorder, single episode, moderate: Secondary | ICD-10-CM | POA: Diagnosis not present

## 2023-08-14 DIAGNOSIS — Z419 Encounter for procedure for purposes other than remedying health state, unspecified: Secondary | ICD-10-CM | POA: Diagnosis not present

## 2023-08-22 DIAGNOSIS — F321 Major depressive disorder, single episode, moderate: Secondary | ICD-10-CM | POA: Diagnosis not present

## 2023-08-23 DIAGNOSIS — F321 Major depressive disorder, single episode, moderate: Secondary | ICD-10-CM | POA: Diagnosis not present

## 2023-08-24 DIAGNOSIS — F321 Major depressive disorder, single episode, moderate: Secondary | ICD-10-CM | POA: Diagnosis not present

## 2023-08-27 DIAGNOSIS — J9622 Acute and chronic respiratory failure with hypercapnia: Secondary | ICD-10-CM | POA: Diagnosis not present

## 2023-08-27 DIAGNOSIS — E662 Morbid (severe) obesity with alveolar hypoventilation: Secondary | ICD-10-CM | POA: Diagnosis not present

## 2023-08-31 ENCOUNTER — Telehealth (INDEPENDENT_AMBULATORY_CARE_PROVIDER_SITE_OTHER): Payer: Self-pay | Admitting: Primary Care

## 2023-08-31 DIAGNOSIS — F321 Major depressive disorder, single episode, moderate: Secondary | ICD-10-CM | POA: Diagnosis not present

## 2023-08-31 NOTE — Telephone Encounter (Signed)
 Left VM with pt about their upcoming appt.

## 2023-09-01 DIAGNOSIS — F321 Major depressive disorder, single episode, moderate: Secondary | ICD-10-CM | POA: Diagnosis not present

## 2023-09-07 ENCOUNTER — Encounter (INDEPENDENT_AMBULATORY_CARE_PROVIDER_SITE_OTHER): Payer: Self-pay | Admitting: Primary Care

## 2023-09-07 ENCOUNTER — Ambulatory Visit (INDEPENDENT_AMBULATORY_CARE_PROVIDER_SITE_OTHER): Admitting: Primary Care

## 2023-09-07 VITALS — BP 130/76 | HR 98 | Wt 374.2 lb

## 2023-09-07 DIAGNOSIS — N979 Female infertility, unspecified: Secondary | ICD-10-CM | POA: Diagnosis not present

## 2023-09-07 DIAGNOSIS — I10 Essential (primary) hypertension: Secondary | ICD-10-CM

## 2023-09-07 DIAGNOSIS — E66813 Obesity, class 3: Secondary | ICD-10-CM

## 2023-09-07 DIAGNOSIS — G8929 Other chronic pain: Secondary | ICD-10-CM | POA: Diagnosis not present

## 2023-09-07 DIAGNOSIS — E119 Type 2 diabetes mellitus without complications: Secondary | ICD-10-CM | POA: Diagnosis not present

## 2023-09-07 DIAGNOSIS — Z794 Long term (current) use of insulin: Secondary | ICD-10-CM | POA: Diagnosis not present

## 2023-09-07 DIAGNOSIS — R519 Headache, unspecified: Secondary | ICD-10-CM

## 2023-09-07 DIAGNOSIS — J302 Other seasonal allergic rhinitis: Secondary | ICD-10-CM

## 2023-09-07 MED ORDER — LOSARTAN POTASSIUM 25 MG PO TABS: 25.0000 mg | ORAL_TABLET | Freq: Every day | ORAL | 0 refills | Status: AC

## 2023-09-07 MED ORDER — CETIRIZINE HCL 10 MG PO TABS: 10.0000 mg | ORAL_TABLET | Freq: Every day | ORAL | 11 refills | Status: AC

## 2023-09-07 MED ORDER — HYDROCHLOROTHIAZIDE 25 MG PO TABS
25.0000 mg | ORAL_TABLET | Freq: Every day | ORAL | 3 refills | Status: AC
Start: 2023-09-07 — End: ?

## 2023-09-07 MED ORDER — IBUPROFEN 800 MG PO TABS: 800.0000 mg | ORAL_TABLET | Freq: Three times a day (TID) | ORAL | 1 refills | Status: AC | PRN

## 2023-09-07 MED ORDER — OZEMPIC (0.25 OR 0.5 MG/DOSE) 2 MG/3ML ~~LOC~~ SOPN: 0.2500 mg | PEN_INJECTOR | SUBCUTANEOUS | 1 refills | Status: AC

## 2023-09-07 NOTE — Patient Instructions (Signed)
 Obesity, Adult Obesity is the condition of having too much total body fat. Being overweight or obese means that your weight is greater than what is considered healthy for your body size. Obesity is determined by a measurement called BMI (body mass index). BMI is an estimate of body fat and is calculated from height and weight. For adults, a BMI of 30 or higher is considered obese. Obesity can lead to other health concerns and major illnesses, including: Stroke. Coronary artery disease (CAD). Type 2 diabetes. Some types of cancer, including cancers of the colon, breast, uterus, and gallbladder. High blood pressure (hypertension). High cholesterol. Gallbladder stones. Obesity can also contribute to: Osteoarthritis. Sleep apnea. Infertility problems. What are the causes? Common causes of this condition include: Eating daily meals that are high in calories, sugar, and fat. Drinking high amounts of sugar-sweetened beverages, such as soft drinks. Being born with genes that may make you more likely to become obese. Having a medical condition that causes obesity, including: Hypothyroidism. Polycystic ovarian syndrome (PCOS). Binge-eating disorder. Cushing syndrome. Taking certain medicines, such as steroids, antidepressants, and seizure medicines. Not being physically active (sedentary lifestyle). Not getting enough sleep. What increases the risk? The following factors may make you more likely to develop this condition: Having a family history of obesity. Living in an area with limited access to: [place], recreation centers, or sidewalks. Healthy food choices, such as grocery stores and farmers' markets. What are the signs or symptoms? The main sign of this condition is having too much body fat. How is this diagnosed? This condition is diagnosed based on: Your BMI. If you are an adult with a BMI of 30 or higher, you are considered obese. Your waist circumference. This measures the  distance around your waistline. Your skinfold thickness. Your health care provider may gently pinch a fold of your skin and measure it. You may have other tests to check for underlying conditions. How is this treated? Treatment for this condition often includes changing your lifestyle. Treatment may include some or all of the following: Dietary changes. This may include developing a healthy meal plan. Regular physical activity. This may include activity that causes your heart to beat faster (aerobic exercise) and strength training. Work with your health care provider to design an exercise program that works for you. Medicine to help you lose weight if you are unable to lose one pound a week after six weeks of healthy eating and more physical activity. Treating conditions that cause the obesity (underlying conditions). Surgery. Surgical options may include gastric banding and gastric bypass. Surgery may be done if: Other treatments have not helped to improve your condition. You have a BMI of 40 or higher. You have life-threatening health problems related to obesity. Follow these instructions at home: Eating and drinking  Follow recommendations from your health care provider about what you eat and drink. Your health care provider may advise you to: Limit fast food, sweets, and processed snack foods. Choose low-fat options, such as low-fat milk instead of whole milk. Eat five or more servings of fruits or vegetables every day. Choose healthy foods when you eat out. Keep low-fat snacks available. Limit sugary drinks, such as soda, fruit juice, sweetened iced tea, and flavored milk. Drink enough water to keep your urine pale yellow. Do not follow a fad diet. Fad diets can be unhealthy and even dangerous. Other healthful choices include: Eat at home more often. This gives you more control over what you eat. Learn to read food labels.  This will help you understand how much food is considered one  serving. Learn what a healthy serving size is. Physical activity Exercise regularly, as told by your health care provider. Most adults should get up to 150 minutes of moderate-intensity exercise every week. Ask your health care provider what types of exercise are safe for you and how often you should exercise. Warm up and stretch before being active. Cool down and stretch after being active. Rest between periods of activity. Lifestyle Work with your health care provider and a dietitian to set a weight-loss goal that is healthy and reasonable for you. Limit your screen time. Find ways to reward yourself that do not involve food. Do not drink alcohol if: Your health care provider tells you not to drink. You are pregnant, may be pregnant, or are planning to become pregnant. If you drink alcohol: Limit how much you have to: 0-1 drink a day for women. 0-2 drinks a day for men. Know how much alcohol is in your drink. In the [place] one drink equals one 12 oz bottle of beer (355 mL), one 5 oz glass of wine (148 mL), or one 1 oz glass of hard liquor (44 mL). General instructions Keep a weight-loss journal to keep track of the food you eat and how much exercise you get. Take over-the-counter and prescription medicines only as told by your health care provider. Take vitamins and supplements only as told by your health care provider. Consider joining a support group. Your health care provider may be able to recommend a support group. Pay attention to your mental health as obesity can lead to depression or self esteem issues. Keep all follow-up visits. This is important. Contact a health care provider if: You are unable to meet your weight-loss goal after six weeks of dietary and lifestyle changes. You have trouble breathing. Summary Obesity is the condition of having too much total body fat. Being overweight or obese means that your weight is greater than what is considered healthy for your body  size. Work with your health care provider and a dietitian to set a weight-loss goal that is healthy and reasonable for you. Exercise regularly, as told by your health care provider. Ask your health care provider what types of exercise are safe for you and how often you should exercise. This information is not intended to replace advice given to you by your health care provider. Make sure you discuss any questions you have with your health care provider. Document Revised: 01/07/2021 Document Reviewed: 01/07/2021 Elsevier Patient Education  2024 [employer].

## 2023-09-07 NOTE — Progress Notes (Signed)
 [facility]  Yvette Gibbs is a 33 y.o. female presents to office today for annual physical exam examination.    Concerns today include: 1.Infertility  2. Weight  3. Muscle shoulders bilateral , hips, S1-S4  Occupation: wife, Marital status: M, Substance use: N Diet: None  Exercise: walking   Health Maintenance  Topic Date Due   OPHTHALMOLOGY EXAM  Never done   Pneumococcal Vaccine 48-22 Years old (2 of 2 - PCV) 09/06/2015   FOOT EXAM  10/24/2021   Cervical Cancer Screening (HPV/Pap Cotest)  Never done   INFLUENZA VACCINE  01/14/2023   COVID-19 Vaccine (3 - 2024-25 season) 02/14/2023   Diabetic kidney evaluation - Urine ACR  07/02/2023   HEMOGLOBIN A1C  01/09/2024   Diabetic kidney evaluation - eGFR measurement  07/15/2024   DTaP/Tdap/Td (2 - Td or Tdap) 10/25/2030   Hepatitis C Screening  Completed   HIV Screening  Completed   HPV VACCINES  Aged Out     Past Medical History:  Diagnosis Date   Acute exacerbation of CHF (congestive heart failure) (HCC) 05/04/2021   Asthma    Depression    Diabetes mellitus type 2, uncontrolled    Extreme obesity    Family history of adverse reaction to anesthesia    " my grandmother had an allergic reaction   Hypertension    Mental disorder    Obesity    OSA (obstructive sleep apnea)    Social History   Socioeconomic History   Marital status: Significant Other    Spouse name: Not on file   Number of children: 0   Years of education: Not on file   Highest education level: 11th grade  Occupational History   Occupation: Estate agent at [employer]  Tobacco Use   Smoking status: Former    Current packs/day: 0.00    Average packs/day: 0.5 packs/day for 10.0 years (5.0 ttl pk-yrs)    Types: Cigarettes    Start date: 06/15/2008    Quit date: 06/15/2018    Years since quitting: 5.2   Smokeless tobacco: Never  Vaping Use   Vaping status: Former   Quit date: 06/02/2020   Substances: Nicotine, Flavoring   Substance and Sexual Activity   Alcohol use: Not Currently   Drug use: Not Currently    Types: Marijuana    Comment: 10/2020 last use   Sexual activity: Yes    Partners: Male    Birth control/protection: Condom  Other Topics Concern   Not on file  Social History Narrative   Not on file   Social Drivers of Health   Financial Resource Strain: Low Risk  (05/05/2021)   Overall Financial Resource Strain (CARDIA)    Difficulty of Paying Living Expenses: Not very hard  Food Insecurity: Food Insecurity Present (07/12/2023)   Hunger Vital Sign    Worried About Running Out of Food in the Last Year: Sometimes true    Ran Out of Food in the Last Year: Sometimes true  Transportation Needs: Unmet Transportation Needs (07/12/2023)   PRAPARE - Administrator, Civil Service (Medical): Yes    Lack of Transportation (Non-Medical): Yes  Physical Activity: Not on file  Stress: Not on file  Social Connections: Not on file  Intimate Partner Violence: Not At Risk (07/12/2023)   Humiliation, Afraid, Rape, and Kick questionnaire    Fear of Current or Ex-Partner: No    Emotionally Abused: No    Physically Abused: No    Sexually Abused: No  Past Surgical History:  Procedure Laterality Date   APPENDECTOMY     CHOLECYSTECTOMY N/A 09/05/2014   Procedure: LAPAROSCOPIC CHOLECYSTECTOMY;  Surgeon: Axel Filler, MD;  Location: [facility] OR;  Service: General;  Laterality: N/A;   TONSILLECTOMY     Family History  Problem Relation Age of Onset   Diabetes Mellitus II Maternal Grandmother     Current Outpatient Medications:    acetaminophen (TYLENOL) 500 MG tablet, Take 1,000 mg by mouth every 6 (six) hours as needed for mild pain (pain score 1-3), fever or headache., Disp: , Rfl:    albuterol (PROVENTIL) (2.5 MG/3ML) 0.083% nebulizer solution, Use 1 vial (2.5 mg total) by nebulization every 4 (four) hours as needed for wheezing or shortness of breath. Do not use in addition to albuterol rescue  inhaler, Disp: 90 mL, Rfl: 0   albuterol (VENTOLIN HFA) 108 (90 Base) MCG/ACT inhaler, Inhale 2 puffs into the lungs every 6 (six) hours as needed for wheezing or shortness of breath (Do not use in addition to albuterol nebulizer)., Disp: 8 g, Rfl: 2   cyclobenzaprine (FLEXERIL) 5 MG tablet, Take 1 tablet (5 mg total) by mouth 3 (three) times daily as needed for muscle spasms., Disp: 10 tablet, Rfl: 0   guaiFENesin-dextromethorphan (ROBITUSSIN DM) 100-10 MG/5ML syrup, Take 5 mLs by mouth every 4 (four) hours as needed for cough., Disp: 118 mL, Rfl: 0   ibuprofen (ADVIL) 200 MG tablet, Take 400 mg by mouth every 6 (six) hours as needed for mild pain (pain score 1-3), headache or fever., Disp: , Rfl:    Semaglutide,0.25 or 0.5MG /DOS, (OZEMPIC, 0.25 OR 0.5 MG/DOSE,) 2 MG/3ML SOPN, Inject 0.25 mg into the skin once a week. For 4 weeks. Then, increase to 0.5 mg into the skin once a week thereafter., Disp: 3 mL, Rfl: 0   budesonide-formoterol (SYMBICORT) 160-4.5 MCG/ACT inhaler, Inhale 2 puffs into the lungs 2 (two) times daily., Disp: 10.2 g, Rfl: 0   insulin glargine (LANTUS SOLOSTAR) 100 UNIT/ML Solostar Pen, Inject 25 Units into the skin daily., Disp: 6 mL, Rfl: 0   losartan (COZAAR) 25 MG tablet, Take 1 tablet (25 mg total) by mouth daily., Disp: 30 tablet, Rfl: 0   rosuvastatin (CRESTOR) 20 MG tablet, Take 1 tablet (20 mg total) by mouth daily., Disp: 30 tablet, Rfl: 0   spironolactone (ALDACTONE) 25 MG tablet, Take 1 tablet (25 mg total) by mouth daily., Disp: 30 tablet, Rfl: 0 Outpatient Encounter Medications as of 09/07/2023  Medication Sig Note   acetaminophen (TYLENOL) 500 MG tablet Take 1,000 mg by mouth every 6 (six) hours as needed for mild pain (pain score 1-3), fever or headache.    albuterol (PROVENTIL) (2.5 MG/3ML) 0.083% nebulizer solution Use 1 vial (2.5 mg total) by nebulization every 4 (four) hours as needed for wheezing or shortness of breath. Do not use in addition to albuterol rescue  inhaler    albuterol (VENTOLIN HFA) 108 (90 Base) MCG/ACT inhaler Inhale 2 puffs into the lungs every 6 (six) hours as needed for wheezing or shortness of breath (Do not use in addition to albuterol nebulizer).    cyclobenzaprine (FLEXERIL) 5 MG tablet Take 1 tablet (5 mg total) by mouth 3 (three) times daily as needed for muscle spasms. 07/12/2023: Pt ran out of refills and prescription expired.   guaiFENesin-dextromethorphan (ROBITUSSIN DM) 100-10 MG/5ML syrup Take 5 mLs by mouth every 4 (four) hours as needed for cough.    ibuprofen (ADVIL) 200 MG tablet Take 400 mg by mouth  every 6 (six) hours as needed for mild pain (pain score 1-3), headache or fever.    Semaglutide,0.25 or 0.5MG /DOS, (OZEMPIC, 0.25 OR 0.5 MG/DOSE,) 2 MG/3ML SOPN Inject 0.25 mg into the skin once a week. For 4 weeks. Then, increase to 0.5 mg into the skin once a week thereafter.    budesonide-formoterol (SYMBICORT) 160-4.5 MCG/ACT inhaler Inhale 2 puffs into the lungs 2 (two) times daily.    insulin glargine (LANTUS SOLOSTAR) 100 UNIT/ML Solostar Pen Inject 25 Units into the skin daily.    losartan (COZAAR) 25 MG tablet Take 1 tablet (25 mg total) by mouth daily.    rosuvastatin (CRESTOR) 20 MG tablet Take 1 tablet (20 mg total) by mouth daily.    spironolactone (ALDACTONE) 25 MG tablet Take 1 tablet (25 mg total) by mouth daily.    No facility-administered encounter medications on file as of 09/07/2023.    Allergies  Allergen Reactions   Shellfish Allergy Itching and Rash   Bee Pollen Other (See Comments)    Sneezing and eye burning   Fish Allergy Itching and Rash   Metformin And Related Itching and Rash   Zestril [Lisinopril] Rash and Other (See Comments)    Diaphoresis      ROS: Review of Systems Pertinent items noted in HPI and remainder of comprehensive ROS otherwise negative.    Physical exam General: No apparent distress. Morbid obesity  Eyes: Extraocular eye movements intact, pupils equal and round. Neck:  Supple, thick ,trachea midline. Thyroid: No enlargement, mobile without fixation, no tenderness. Cardiovascular: Regular rhythm and rate, no murmur, normal radial pulses. Respiratory: Normal respiratory effort, clear to auscultation. Gastrointestinal: Normal pitch active bowel sounds, nontender abdomen without distention or appreciable hepatomegaly. Musculoskeletal: Normal muscle tone, no tenderness on palpation of tibia, no excessive thoracic kyphosis. Skin: Appropriate warmth, no visible rash. Mental status: Alert, conversant, speech clear, thought logical, appropriate mood and affect, no hallucinations or delusions evident. Hematologic/lymphatic: No cervical adenopathy, no visible ecchymoses.    Assessment/ Plan: Yvette Gibbs here for annual physical exam.  Yvette Gibbs was seen today for medical management of chronic issues, medication refill and headache.  Diagnoses and all orders for this visit:  Essential hypertension BP goal - < 130/80 Explained that having normal blood pressure is the goal and medications are helping to get to goal and maintain normal blood pressure. DIET: Limit salt intake, read nutrition labels to check salt content, limit fried and high fatty foods  Avoid using multisymptom OTC cold preparations that generally contain sudafed which can rise BP. Consult with pharmacist on best cold relief products to use for persons with HTN EXERCISE Discussed incorporating exercise such as walking - 30 minutes most days of the week and can do in 10 minute intervals     Class 3 obesity Severe Morbid  Obesity is > 40 indicating an excess in caloric intake or underlining conditions. Has lead to other co-morbidities hypoventilation, T2D, H2N.  Educated on lifestyle modifications of diet and exercise which may reduce obesity.  Ozempic dual purpose   Type 2 diabetes mellitus without complication, without long-term current use of insulin (HCC) A1C 7.3 - educated on lifestyle  modifications, including but not limited to diet choices and adding exercise to daily routine.    Chronic nonintractable headache, unspecified headache type buprofen 800mg  TID PRN requesting oxycodone worked in hospital if not effective refer to pain management \  Infertility  Referred to GYN   Counseled on healthy lifestyle choices, including diet (rich in fruits, vegetables  and lean meats and low in salt and simple carbohydrates) and exercise (at least 30 minutes of moderate physical activity daily).  Patient to follow up in 1 year for annual exam or sooner if needed.  The above assessment and management plan was discussed with the patient. The patient verbalized understanding of and has agreed to the management plan. Patient is aware to call the clinic if symptoms persist or worsen. Patient is aware when to return to the clinic for a follow-up visit. Patient educated on when it is appropriate to go to the emergency department.   This note has been created with Education officer, environmental. Any transcriptional errors are unintentional.   Grayce Sessions, NP 09/07/2023, 2:46 PM

## 2023-09-08 ENCOUNTER — Telehealth (INDEPENDENT_AMBULATORY_CARE_PROVIDER_SITE_OTHER): Payer: Self-pay | Admitting: Primary Care

## 2023-09-08 DIAGNOSIS — F321 Major depressive disorder, single episode, moderate: Secondary | ICD-10-CM | POA: Diagnosis not present

## 2023-09-08 LAB — CMP14+EGFR
ALT: 37 IU/L — ABNORMAL HIGH (ref 0–32)
AST: 29 IU/L (ref 0–40)
Albumin: 4.1 g/dL (ref 4.0–5.0)
Alkaline Phosphatase: 84 IU/L (ref 44–121)
BUN/Creatinine Ratio: 12 (ref 9–23)
BUN: 7 mg/dL (ref 6–20)
Bilirubin Total: 0.3 mg/dL (ref 0.0–1.2)
CO2: 25 mmol/L (ref 20–29)
Calcium: 9.1 mg/dL (ref 8.7–10.2)
Chloride: 100 mmol/L (ref 96–106)
Creatinine, Ser: 0.57 mg/dL (ref 0.57–1.00)
Globulin, Total: 2.4 g/dL (ref 1.5–4.5)
Glucose: 135 mg/dL — ABNORMAL HIGH (ref 70–99)
Potassium: 4.2 mmol/L (ref 3.5–5.2)
Sodium: 140 mmol/L (ref 134–144)
Total Protein: 6.5 g/dL (ref 6.0–8.5)
eGFR: 125 mL/min/{1.73_m2} (ref >59)

## 2023-09-08 LAB — LIPID PANEL
Chol/HDL Ratio: 3.1 ratio (ref 0.0–4.4)
Cholesterol, Total: 212 mg/dL — ABNORMAL HIGH (ref 100–199)
HDL: 68 mg/dL (ref >39)
LDL Chol Calc (NIH): 114 mg/dL — ABNORMAL HIGH (ref 0–99)
Triglycerides: 175 mg/dL — ABNORMAL HIGH (ref 0–149)
VLDL Cholesterol Cal: 30 mg/dL (ref 5–40)

## 2023-09-08 NOTE — Telephone Encounter (Signed)
 Copied from CRM [id number]. Topic: General - Other >> Sep 07, 2023  9:27 AM Elle L wrote: Reason for CRM: Respiratory Therapist Wylene Men with [employer], [phone], needs medical equipment re-authorized for the patient. She needs a note stating that the patient is benefiting and using her non-invasive ventilator for her insurance to continue approving it. She advised that is has to state non-invasive ventilator and not BiPAP or CPAP for her insurance to approve it. She needs this note and her appointment notes from today. I provided her with the fax number to fax it as well.

## 2023-09-09 ENCOUNTER — Telehealth (INDEPENDENT_AMBULATORY_CARE_PROVIDER_SITE_OTHER): Payer: Self-pay

## 2023-09-09 DIAGNOSIS — F321 Major depressive disorder, single episode, moderate: Secondary | ICD-10-CM | POA: Diagnosis not present

## 2023-09-09 NOTE — Telephone Encounter (Signed)
 Duplicate message.   Copied from CRM [id number]. Topic: General - Other >> Sep 08, 2023 10:59 AM Higinio Roger wrote: Reason for CRM: Wylene Men from [employer] is requesting the provider to create an addendum to include patient is using and benefiting from non-invasive ventilator therapy.   Callback #: [phone] Fax: [phone]

## 2023-09-10 ENCOUNTER — Other Ambulatory Visit (INDEPENDENT_AMBULATORY_CARE_PROVIDER_SITE_OTHER): Payer: Self-pay | Admitting: Primary Care

## 2023-09-10 DIAGNOSIS — E782 Mixed hyperlipidemia: Secondary | ICD-10-CM

## 2023-09-10 MED ORDER — ROSUVASTATIN CALCIUM 40 MG PO TABS: 40.0000 mg | ORAL_TABLET | Freq: Every day | ORAL | 1 refills | Status: AC

## 2023-09-10 NOTE — Telephone Encounter (Signed)
 Patient needing note for re-authorization please advise

## 2023-09-14 ENCOUNTER — Encounter (INDEPENDENT_AMBULATORY_CARE_PROVIDER_SITE_OTHER): Payer: Self-pay | Admitting: Primary Care

## 2023-09-15 DIAGNOSIS — F321 Major depressive disorder, single episode, moderate: Secondary | ICD-10-CM | POA: Diagnosis not present

## 2023-09-17 DIAGNOSIS — F321 Major depressive disorder, single episode, moderate: Secondary | ICD-10-CM | POA: Diagnosis not present

## 2023-09-23 DIAGNOSIS — F321 Major depressive disorder, single episode, moderate: Secondary | ICD-10-CM | POA: Diagnosis not present

## 2023-09-23 NOTE — Telephone Encounter (Signed)
 noted

## 2023-09-23 NOTE — Telephone Encounter (Signed)
 Tried contacting pt was unable to reach pt due to call can't be completed as dialed

## 2023-09-25 DIAGNOSIS — Z419 Encounter for procedure for purposes other than remedying health state, unspecified: Secondary | ICD-10-CM | POA: Diagnosis not present

## 2023-09-26 DIAGNOSIS — F321 Major depressive disorder, single episode, moderate: Secondary | ICD-10-CM | POA: Diagnosis not present

## 2023-09-27 DIAGNOSIS — E662 Morbid (severe) obesity with alveolar hypoventilation: Secondary | ICD-10-CM | POA: Diagnosis not present

## 2023-09-27 DIAGNOSIS — J9622 Acute and chronic respiratory failure with hypercapnia: Secondary | ICD-10-CM | POA: Diagnosis not present

## 2023-09-30 DIAGNOSIS — F321 Major depressive disorder, single episode, moderate: Secondary | ICD-10-CM | POA: Diagnosis not present

## 2023-10-05 ENCOUNTER — Telehealth (INDEPENDENT_AMBULATORY_CARE_PROVIDER_SITE_OTHER): Payer: Self-pay | Admitting: Primary Care

## 2023-10-05 ENCOUNTER — Ambulatory Visit (INDEPENDENT_AMBULATORY_CARE_PROVIDER_SITE_OTHER): Admitting: Primary Care

## 2023-10-05 NOTE — Telephone Encounter (Signed)
 Left VM with pt to make apt virtual. Pt did not answer and other phone rings to a company. Not sure if that number is correct. If pt does call back we need to reschedule appt or offer virtual appt.

## 2023-10-25 DIAGNOSIS — Z419 Encounter for procedure for purposes other than remedying health state, unspecified: Secondary | ICD-10-CM | POA: Diagnosis not present

## 2023-10-27 DIAGNOSIS — E662 Morbid (severe) obesity with alveolar hypoventilation: Secondary | ICD-10-CM | POA: Diagnosis not present

## 2023-10-27 DIAGNOSIS — J9622 Acute and chronic respiratory failure with hypercapnia: Secondary | ICD-10-CM | POA: Diagnosis not present

## 2023-11-17 DIAGNOSIS — F321 Major depressive disorder, single episode, moderate: Secondary | ICD-10-CM | POA: Diagnosis not present

## 2023-11-18 ENCOUNTER — Encounter (HOSPITAL_COMMUNITY): Payer: Self-pay | Admitting: *Deleted

## 2023-11-18 ENCOUNTER — Other Ambulatory Visit: Payer: Self-pay

## 2023-11-18 ENCOUNTER — Emergency Department (HOSPITAL_COMMUNITY)

## 2023-11-18 ENCOUNTER — Emergency Department (HOSPITAL_COMMUNITY): Admission: EM | Admit: 2023-11-18 | Discharge: 2023-11-18 | Disposition: A | Discharge status: Home / Self Care

## 2023-11-18 DIAGNOSIS — Z794 Long term (current) use of insulin: Secondary | ICD-10-CM | POA: Insufficient documentation

## 2023-11-18 DIAGNOSIS — E119 Type 2 diabetes mellitus without complications: Secondary | ICD-10-CM | POA: Diagnosis not present

## 2023-11-18 DIAGNOSIS — M7989 Other specified soft tissue disorders: Secondary | ICD-10-CM

## 2023-11-18 DIAGNOSIS — M25531 Pain in right wrist: Secondary | ICD-10-CM | POA: Diagnosis not present

## 2023-11-18 DIAGNOSIS — I509 Heart failure, unspecified: Secondary | ICD-10-CM | POA: Diagnosis not present

## 2023-11-18 DIAGNOSIS — I110 Hypertensive heart disease with heart failure: Secondary | ICD-10-CM | POA: Diagnosis not present

## 2023-11-18 DIAGNOSIS — Z79899 Other long term (current) drug therapy: Secondary | ICD-10-CM | POA: Diagnosis not present

## 2023-11-18 DIAGNOSIS — M79641 Pain in right hand: Secondary | ICD-10-CM | POA: Insufficient documentation

## 2023-11-18 DIAGNOSIS — J45909 Unspecified asthma, uncomplicated: Secondary | ICD-10-CM | POA: Diagnosis not present

## 2023-11-18 LAB — CBC WITH DIFFERENTIAL/PLATELET
Abs Immature Granulocytes: 0.02 10*3/uL (ref 0.00–0.07)
Basophils Absolute: 0 10*3/uL (ref 0.0–0.1)
Basophils Relative: 1 %
Eosinophils Absolute: 0.1 10*3/uL (ref 0.0–0.5)
Eosinophils Relative: 3 %
HCT: 42.9 % (ref 36.0–46.0)
Hemoglobin: 13.3 g/dL (ref 12.0–15.0)
Immature Granulocytes: 0 %
Lymphocytes Relative: 28 %
Lymphs Abs: 1.6 10*3/uL (ref 0.7–4.0)
MCH: 28 pg (ref 26.0–34.0)
MCHC: 31 g/dL (ref 30.0–36.0)
MCV: 90.3 fL (ref 80.0–100.0)
Monocytes Absolute: 0.6 10*3/uL (ref 0.1–1.0)
Monocytes Relative: 10 %
Neutro Abs: 3.3 10*3/uL (ref 1.7–7.7)
Neutrophils Relative %: 58 %
Platelets: 256 10*3/uL (ref 150–400)
RBC: 4.75 MIL/uL (ref 3.87–5.11)
RDW: 12.9 % (ref 11.5–15.5)
WBC: 5.7 10*3/uL (ref 4.0–10.5)
nRBC: 0 % (ref 0.0–0.2)

## 2023-11-18 LAB — BASIC METABOLIC PANEL WITH GFR
Anion gap: 8 (ref 5–15)
BUN: 7 mg/dL (ref 6–20)
CO2: 29 mmol/L (ref 22–32)
Calcium: 9 mg/dL (ref 8.9–10.3)
Chloride: 98 mmol/L (ref 98–111)
Creatinine, Ser: 0.55 mg/dL (ref 0.44–1.00)
GFR, Estimated: >60 mL/min (ref >60)
Glucose, Bld: 179 mg/dL — ABNORMAL HIGH (ref 70–99)
Potassium: 3.9 mmol/L (ref 3.5–5.1)
Sodium: 135 mmol/L (ref 135–145)

## 2023-11-18 LAB — HCG, SERUM, QUALITATIVE: Preg, Serum: NEGATIVE

## 2023-11-18 MED ORDER — HYDROCODONE-ACETAMINOPHEN 5-325 MG PO TABS
1.0000 | ORAL_TABLET | Freq: Once | ORAL | Status: AC
  Administered 2023-11-18: 1 via ORAL
  Filled 2023-11-18: qty 1

## 2023-11-18 MED ORDER — MELOXICAM 7.5 MG PO TABS: 7.5000 mg | ORAL_TABLET | Freq: Every day | ORAL | 0 refills | Status: AC

## 2023-11-18 NOTE — ED Provider Notes (Signed)
 [place] [facility] DEPARTMENT AT [facility] Provider Note   CSN: [id number] Arrival date & time: 11/18/23  4098     History  Chief Complaint  Patient presents with   Hand Pain    Yvette Gibbs is a 33 y.o. female.  Patient with history of CHF, diabetes, morbid obesity, hypertension presents today with complaints of right hand pain. She states that she first noticed same on Sunday afternoon. Pain has worsened since then. Denies any trauma or recent overuse. Has tried OTC pain medication with minimal improvement. Denies history of similar pain previously. No fevers or chills. No history of IVDU. 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 Also notes that she has a 31 year old that she carries frequently. She is right hand dominant.  The history is provided by the patient. No language interpreter was used.  Hand Pain       Home Medications Prior to Admission medications   Medication Sig Start Date End Date Taking? Authorizing Provider  acetaminophen  (TYLENOL ) 500 MG tablet Take 1,000 mg by mouth every 6 (six) hours as needed for mild pain (pain score 1-3), fever or headache.    [provider]  albuterol  (PROVENTIL ) (2.5 MG/3ML) 0.083% nebulizer solution Use 1 vial (2.5 mg total) by nebulization every 4 (four) hours as needed for wheezing or shortness of breath. Do not use in addition to albuterol  rescue inhaler 12/11/22 10/13/23  Joaquin Mulberry, MD  albuterol  (VENTOLIN  HFA) 108 (90 Base) MCG/ACT inhaler Inhale 2 puffs into the lungs every 6 (six) hours as needed for wheezing or shortness of breath (Do not use in addition to albuterol  nebulizer). 08/27/22   Verlyn Goad, MD  budesonide -formoterol  (SYMBICORT ) 160-4.5 MCG/ACT inhaler Inhale 2 puffs into the lungs 2 (two) times daily. 07/16/23 08/15/23  Magdalene School, MD  cetirizine  (ZYRTEC ) 10 MG tablet Take 1 tablet (10 mg total) by mouth daily. 09/07/23   Marius Siemens, NP   cyclobenzaprine  (FLEXERIL ) 5 MG tablet Take 1 tablet (5 mg total) by mouth 3 (three) times daily as needed for muscle spasms. 12/11/22   Newlin, Enobong, MD  guaiFENesin -dextromethorphan  (ROBITUSSIN DM) 100-10 MG/5ML syrup Take 5 mLs by mouth every 4 (four) hours as needed for cough. 07/16/23   Magdalene School, MD  hydrochlorothiazide  (HYDRODIURIL ) 25 MG tablet Take 1 tablet (25 mg total) by mouth daily. 09/07/23   Marius Siemens, NP  ibuprofen  (ADVIL ) 800 MG tablet Take 1 tablet (800 mg total) by mouth every 8 (eight) hours as needed. 09/07/23   Marius Siemens, NP  insulin  glargine (LANTUS  SOLOSTAR) 100 UNIT/ML Solostar Pen Inject 25 Units into the skin daily. 07/16/23 08/15/23  Magdalene School, MD  losartan  (COZAAR ) 25 MG tablet Take 1 tablet (25 mg total) by mouth daily. 09/07/23 10/07/23  Marius Siemens, NP  rosuvastatin  (CRESTOR ) 40 MG tablet Take 1 tablet (40 mg total) by mouth daily. 09/10/23   Marius Siemens, NP  Semaglutide ,0.25 or 0.5MG /DOS, (OZEMPIC , 0.25 OR 0.5 MG/DOSE,) 2 MG/3ML SOPN Inject 0.25 mg into the skin once a week. 09/07/23   Marius Siemens, NP      Allergies    Shellfish allergy, Bee pollen, Fish allergy, Metformin  and related, and Zestril  [lisinopril ]    Review of Systems   Review of Systems  Musculoskeletal:  Positive for arthralgias and myalgias.  All other systems reviewed and are negative.   Physical Exam Updated Vital Signs BP (!) 179/112   Pulse 83  Temp 98.5 F (36.9 C) (Oral)   Resp 20   SpO2 100%  Physical Exam Vitals and nursing note reviewed.  Constitutional:      General: She is not in acute distress.    Appearance: Normal appearance. She is normal weight. She is not ill-appearing, toxic-appearing or diaphoretic.  HENT:     Head: Normocephalic and atraumatic.  Cardiovascular:     Rate and Rhythm: Normal rate.  Pulmonary:     Effort: Pulmonary effort is normal. No respiratory distress.  Musculoskeletal:        General: Normal  range of motion.     Cervical back: Normal range of motion.     Comments: TTP noted to the dorsal aspect of the base of the right thumb and index finger and into the wrist as well. Some swelling noted, however does not appear profoundly different from the left hand, body habitus is limiting. No swelling to the palmar aspect of the hand. No erythema, warmth, fluctuance, or induration. Good radial pulse, compartments soft. Good capillary refill. Is able to range her wrist and hand with discomfort. + Finkelstein's, negative Tinel's, unable to perform Phalen's due to pain. Does have tenderness around the snuffbox  Skin:    General: Skin is warm and dry.  Neurological:     General: No focal deficit present.     Mental Status: She is alert.  Psychiatric:        Mood and Affect: Mood normal.        Behavior: Behavior normal.     ED Results / Procedures / Treatments   Labs (all labs ordered are listed, but only abnormal results are displayed) Labs Reviewed  BASIC METABOLIC PANEL WITH GFR - Abnormal; Notable for the following components:      Result Value   Glucose, Bld 179 (*)    All other components within normal limits  HCG, SERUM, QUALITATIVE  CBC WITH DIFFERENTIAL/PLATELET    EKG None  Radiology UE VENOUS DUPLEX (7am - 7pm) Result Date: 11/18/2023 UPPER VENOUS STUDY  Patient Name:  Yvette Gibbs  Date of Exam:   11/18/2023 Medical Rec #: [id number]         Accession #:    [id number] Date of Birth: 10/05/1990         Patient Gender: F Patient Age:   35 years Exam Location:  [facility] Procedure:      VAS US  UPPER EXTREMITY VENOUS DUPLEX Referring Phys: [facility] Koa Zoeller --------------------------------------------------------------------------------  Indications: Swelling, Pain, and Right hand Risk Factors: Obesity. Limitations: Body habitus and poor ultrasound/tissue interface. Performing Technologist: Franky Ivanoff Sturdivant-Jones RDMS, RVT  Examination Guidelines: A complete evaluation includes  B-mode imaging, spectral Doppler, color Doppler, and power Doppler as needed of all accessible portions of each vessel. Bilateral testing is considered an integral part of a complete examination. Limited examinations for reoccurring indications may be performed as noted.  Right Findings: +----------+------------+---------+-----------+----------+-------+ RIGHT     CompressiblePhasicitySpontaneousPropertiesSummary +----------+------------+---------+-----------+----------+-------+ IJV           Full       Yes       Yes                      +----------+------------+---------+-----------+----------+-------+ Subclavian    Full       Yes       Yes                      +----------+------------+---------+-----------+----------+-------+ Axillary  Full       Yes       Yes                      +----------+------------+---------+-----------+----------+-------+ Brachial      Full                                          +----------+------------+---------+-----------+----------+-------+ Radial        Full                                          +----------+------------+---------+-----------+----------+-------+ Ulnar         Full                                          +----------+------------+---------+-----------+----------+-------+ Cephalic      Full                                          +----------+------------+---------+-----------+----------+-------+ Basilic       Full                                          +----------+------------+---------+-----------+----------+-------+  Left Findings: +----------+------------+---------+-----------+----------+-------+ LEFT      CompressiblePhasicitySpontaneousPropertiesSummary +----------+------------+---------+-----------+----------+-------+ Subclavian               Yes       Yes                      +----------+------------+---------+-----------+----------+-------+  Summary:  Right: No evidence of  deep vein thrombosis in the upper extremity. No evidence of superficial vein thrombosis in the upper extremity.  Left: No evidence of thrombosis in the subclavian.  *See table(s) above for measurements and observations.    Preliminary    DG Wrist Complete Right Result Date: 11/18/2023 CLINICAL DATA:  Swelling and pain EXAM: RIGHT WRIST - COMPLETE 3+ VIEW COMPARISON:  None Available. FINDINGS: There is no evidence of fracture or dislocation. There is no evidence of arthropathy or other focal bone abnormality. Soft tissues are unremarkable. IMPRESSION: Negative. Electronically Signed   By: Fredrich Jefferson M.D.   On: 11/18/2023 10:07   DG Hand Complete Right Result Date: 11/18/2023 CLINICAL DATA:  Pain and swelling EXAM: RIGHT HAND - COMPLETE 3+ VIEW COMPARISON:  None Available. FINDINGS: No fractures.  Diffuse soft tissue swelling dorsum of the hand. IMPRESSION: No fractures.  Diffuse soft tissue swelling Electronically Signed   By: Fredrich Jefferson M.D.   On: 11/18/2023 10:06    Procedures .Ortho Injury Treatment  Date/Time: 11/18/2023 1:49 PM  Performed by: Sherra Dk, PA-C Authorized by: Sherra Dk, PA-C   Consent:    Consent obtained:  Verbal   Consent given by:  Patient   Risks discussed:  Nerve damage, restricted joint movement and stiffness   Alternatives discussed:  No treatment, alternative treatment and referralInjury location: hand Location details: right hand Injury type: soft tissue Pre-procedure neurovascular assessment: neurovascularly intact Pre-procedure distal  perfusion: normal Pre-procedure neurological function: normal Pre-procedure range of motion: reduced  Anesthesia: Local anesthesia used: no  Patient sedated: NoImmobilization: splint Splint type: thumb spica Splint Applied by: Ortho Tech Supplies used: Velcro removable splint. Post-procedure neurovascular assessment: post-procedure neurovascularly intact Post-procedure distal perfusion: normal Post-procedure  neurological function: normal Post-procedure range of motion: unchanged       Medications Ordered in ED Medications  HYDROcodone -acetaminophen  (NORCO/VICODIN) 5-325 MG per tablet 1 tablet (1 tablet Oral Given 11/18/23 0943)  HYDROcodone -acetaminophen  (NORCO/VICODIN) 5-325 MG per tablet 1 tablet (1 tablet Oral Given 11/18/23 1157)    ED Course/ Medical Decision Making/ A&P                                 Medical Decision Making Risk Prescription drug management.   This patient is a 33 y.o. female who presents to the ED for concern of right hand pain, this involves an extensive number of treatment options, and is a complaint that carries with it a high risk of complications and morbidity. The emergent differential diagnosis prior to evaluation includes, but is not limited to,  septic arthritis, gout, flexor tenosynovitis, abscess, stress fracture, DVT, tendinitis, carpal tunnel, inflammatory arthritis. This is not an exhaustive differential.   Past Medical History / Co-morbidities / Social History:  has a past medical history of Acute exacerbation of CHF (congestive heart failure) (HCC) (05/04/2021), Asthma, Depression, Diabetes mellitus type 2, uncontrolled, Extreme obesity, Family history of adverse reaction to anesthesia, Hypertension, Mental disorder, Obesity, and OSA (obstructive sleep apnea).  Additional history: Chart reviewed.   Physical Exam: Physical exam performed. The pertinent findings include:   TTP noted to the dorsal aspect of the base of the right thumb and index finger and into the wrist as well. Some swelling noted, however does not appear profoundly different from the left hand, body habitus is limiting. No swelling to the palmar aspect of the hand. No erythema, warmth, fluctuance, or induration. Good radial pulse, compartments soft. Good capillary refill. Is able to range her wrist and hand with discomfort. + Finkelstein's, negative Tinel's, unable to perform Phalen's  due to pain. Does have tenderness around the snuffbox  Lab Tests: I ordered, and personally interpreted labs.  The pertinent results include:  no leukocytosis, no other acute laboratory abnormalities   Imaging Studies: I ordered imaging studies including DG right wrist and hand, DVT US . I independently visualized and interpreted imaging which showed   DG: No fractures. Diffuse soft tissue swelling   US : No evidence of deep vein thrombosis in the upper extremity. No evidence of superficial vein thrombosis in the upper extremity.   I agree with the radiologist interpretation.   Medications: I ordered medication including norco  for pain. Reevaluation of the patient after these medicines showed that the patient improved. I have reviewed the patients home medicines and have made adjustments as needed.  Disposition: After consideration of the diagnostic results and the patients response to treatment, I feel that emergency department workup does not suggest an emergent condition requiring admission or immediate intervention beyond what has been performed at this time. The plan is: Discharge with NSAIDs, thumb spica splint, and hand surgery follow-up.  Patient's workup is benign, she has no leukocytosis, her vital signs are stable.  She has no signs or symptoms to suggest septic arthritis.  No concern for flexor tenosynovitis or abscess. Her pain is around the area suspicious for DeQuervians tenosynovitis, also pain  around the snuffbox area concerning for scaphoid injury, however less likely due to atraumatic pain. Could also be carpal tunnel but less likely. She feels better in the thumb spica splint. Recommend RICE as well. Evaluation and diagnostic testing in the emergency department does not suggest an emergent condition requiring admission or immediate intervention beyond what has been performed at this time.  Plan for discharge with close PCP follow-up.  Patient is understanding and amenable with  plan, educated on red flag symptoms that would prompt immediate return.  Patient discharged in stable condition.  Final Clinical Impression(s) / ED Diagnoses Final diagnoses:  Right hand pain    Rx / DC Orders ED Discharge Orders          Ordered    meloxicam  (MOBIC ) 7.5 MG tablet  Daily        11/18/23 1358          An After Visit Summary was printed and given to the patient.     Sherra Dk, PA-C 11/18/23 1401    Rolinda Climes, DO 11/18/23 1536

## 2023-11-18 NOTE — ED Notes (Signed)
 Patient transported to Ultrasound

## 2023-11-18 NOTE — Progress Notes (Signed)
 Orthopedic Tech Progress Note Patient Details:  Yvette Gibbs [birth year] [id number]  Removable thumb spica was placed to the RUE in the best obtainable fashion d/t pt body habitus.  Ortho Devices Type of Ortho Device: Thumb velcro splint Ortho Device/Splint Location: RUE Ortho Device/Splint Interventions: Ordered, Application, Adjustment   Post Interventions Patient Tolerated: Fair Instructions Provided: Care of device, Adjustment of device  [name] [name] 11/18/2023, 3:01 PM

## 2023-11-18 NOTE — Progress Notes (Signed)
 Right upper extremity venous study  has been completed. Refer to [facility] under chart review to view preliminary results.   11/18/2023  12:37 PM Ibtisam Benge, Hollace Lund

## 2023-11-18 NOTE — ED Notes (Addendum)
 Ortho tech is coming to bedside for thumb spica.

## 2023-11-18 NOTE — ED Notes (Signed)
 Awaiting patient from lobby.

## 2023-11-18 NOTE — Discharge Instructions (Signed)
 As we discussed, your work-up in the ER was reassuring for acute findings.  X-ray imaging did not reveal any fractures or dislocations.  Ultrasound did not show any blood clots.  Your labs were also unremarkable.  Given the location of your pain, this could be something known as de Quervain's tenosynovitis.  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  We have given you a brace to wear for support and recommend you rest, ice, compress, and elevate your hand and take Tylenol  as needed for pain.  I have given you a prescription for meloxicam  which is an anti-inflammatory medication for you to take as prescribed as well.  Continue to wear the splint for support, you may take it off to shower.  Ultimately, the most important thing is that you follow-up with a hand specialist.  I have given you a referral with a number to call to schedule an appointment for follow-up.  Please call at your earliest convenience.  Return if development of any new or worsening symptoms.

## 2023-11-18 NOTE — ED Triage Notes (Addendum)
 Right hand pain with pain radiating to arm arm up to elbow which feels like pressure and began while in in church on Sunday.  Not associated with any trauma. Pt has swelling to right hand.

## 2023-11-18 NOTE — ED Provider Triage Note (Signed)
 Emergency Medicine Provider Triage Evaluation Note  Yvette Gibbs , a 33 y.o. female  was evaluated in triage.  Pt complains of right hand pain and swelling.  On and off symptoms starting 4 days ago, more consistent over the last 24-48 hours.  Her right hand seems swollen and she is feeling significant pain in her hand and wrist.  No trauma.  She feels like she had a fever yesterday.  No chest pain.  Review of Systems  Positive: Hand pain and swelling Negative: Forearm or upper arm swelling  Physical Exam  BP (!) 179/112   Pulse 83   Temp 98.5 F (36.9 C) (Oral)   Resp 20   SpO2 100%  Gen:   Awake, no distress Cardiac: 2+ radial pulse MSK:   Right hand swelling, no erythema. Has limited ROM of hand/right wrist   Medical Decision Making  Medically screening exam initiated at 9:39 AM.  Appropriate orders placed.  Eilish Frisbee was informed that the remainder of the evaluation will be completed by another provider, this initial triage assessment does not replace that evaluation, and the importance of remaining in the ED until their evaluation is complete.  Will get basic labs and xray. Hydrocodone  for pain.   Jerilynn Montenegro, MD 11/18/23 [phone]

## 2023-11-18 NOTE — ED Notes (Signed)
 This RN spoke with patient about her wanting to leave. RN explained process and made charge aware.  Patient verbalized understanding and was grateful for the information.

## 2023-11-24 DIAGNOSIS — F321 Major depressive disorder, single episode, moderate: Secondary | ICD-10-CM | POA: Diagnosis not present

## 2023-11-25 DIAGNOSIS — F321 Major depressive disorder, single episode, moderate: Secondary | ICD-10-CM | POA: Diagnosis not present

## 2023-11-25 DIAGNOSIS — Z419 Encounter for procedure for purposes other than remedying health state, unspecified: Secondary | ICD-10-CM | POA: Diagnosis not present

## 2023-12-25 DIAGNOSIS — Z419 Encounter for procedure for purposes other than remedying health state, unspecified: Secondary | ICD-10-CM | POA: Diagnosis not present

## 2024-01-03 ENCOUNTER — Ambulatory Visit (INDEPENDENT_AMBULATORY_CARE_PROVIDER_SITE_OTHER): Admitting: Primary Care

## 2024-01-03 ENCOUNTER — Telehealth (INDEPENDENT_AMBULATORY_CARE_PROVIDER_SITE_OTHER): Payer: Self-pay | Admitting: Primary Care

## 2024-01-03 DIAGNOSIS — Z7689 Persons encountering health services in other specified circumstances: Secondary | ICD-10-CM | POA: Diagnosis not present

## 2024-01-03 NOTE — Telephone Encounter (Signed)
 Contacted pt to reschedule missed appt. Pt did not answer and could not LVM.

## 2024-01-04 DIAGNOSIS — F321 Major depressive disorder, single episode, moderate: Secondary | ICD-10-CM | POA: Diagnosis not present

## 2024-01-05 DIAGNOSIS — F321 Major depressive disorder, single episode, moderate: Secondary | ICD-10-CM | POA: Diagnosis not present

## 2024-01-07 DIAGNOSIS — E662 Morbid (severe) obesity with alveolar hypoventilation: Secondary | ICD-10-CM | POA: Diagnosis not present

## 2024-01-07 DIAGNOSIS — J9622 Acute and chronic respiratory failure with hypercapnia: Secondary | ICD-10-CM | POA: Diagnosis not present

## 2024-01-12 DIAGNOSIS — F321 Major depressive disorder, single episode, moderate: Secondary | ICD-10-CM | POA: Diagnosis not present

## 2024-01-14 DIAGNOSIS — F321 Major depressive disorder, single episode, moderate: Secondary | ICD-10-CM | POA: Diagnosis not present

## 2024-01-25 DIAGNOSIS — Z419 Encounter for procedure for purposes other than remedying health state, unspecified: Secondary | ICD-10-CM | POA: Diagnosis not present

## 2024-02-07 ENCOUNTER — Ambulatory Visit: Payer: Self-pay

## 2024-02-07 DIAGNOSIS — E662 Morbid (severe) obesity with alveolar hypoventilation: Secondary | ICD-10-CM | POA: Diagnosis not present

## 2024-02-07 DIAGNOSIS — J9622 Acute and chronic respiratory failure with hypercapnia: Secondary | ICD-10-CM | POA: Diagnosis not present

## 2024-02-07 NOTE — Telephone Encounter (Signed)
 FYI Only or Action Required?: Action required by provider: request for appointment.  Patient was last seen in primary care on 09/07/2023 by Yvette Rosaline SQUIBB, NP.  Called Nurse Triage reporting Hand Pain.  Symptoms began long time.  Interventions attempted: OTC medications: not sure of meds.  Symptoms are: unchanged.  Triage Disposition: See PCP When Office is Open (Within 3 Days)  Patient/caregiver understands and will follow disposition?: YesCopied from CRM #[id number]. Topic: Clinical - Red Word Triage >> Feb 07, 2024  9:24 AM Robinson H wrote: [facility] that prompted transfer to Nurse Triage: Pain in right hand, between thumb index finger and wrist. When she tries to close hand it's tight and can't fully close hand Reason for Disposition  [1] MODERATE pain (e.g., interferes with normal activities) AND [2] present > 3 days  Answer Assessment - Initial Assessment Questions Pt not forthcoming with symptoms of hand pain. Pt asked to be seen Thursday with PCP.      1. ONSET: When did the pain start?     Long time 2. LOCATION: Where is the pain located?     right 3. PAIN: How bad is the pain? (Scale 1-10; or mild, moderate, severe)     4 4. WORK OR EXERCISE: Has there been any recent work or exercise that involved this part (i.e., hand or wrist) of the body?     denies 5. CAUSE: What do you think is causing the pain?     no 6. AGGRAVATING FACTORS: What makes the pain worse? (e.g., using computer)     Pressing down or close hand 7. OTHER SYMPTOMS: Do you have any other symptoms? (e.g., fever, neck pain, numbness or tingling, rash, swelling)     Swollen, numbness comes and goes  Protocols used: Hand Pain-A-AH

## 2024-02-09 ENCOUNTER — Telehealth (INDEPENDENT_AMBULATORY_CARE_PROVIDER_SITE_OTHER): Payer: Self-pay | Admitting: Primary Care

## 2024-02-09 NOTE — Telephone Encounter (Signed)
 Called pt to confirm appt. Pt will be present.

## 2024-02-10 ENCOUNTER — Ambulatory Visit (INDEPENDENT_AMBULATORY_CARE_PROVIDER_SITE_OTHER): Payer: Self-pay | Admitting: Primary Care

## 2024-02-25 DIAGNOSIS — Z419 Encounter for procedure for purposes other than remedying health state, unspecified: Secondary | ICD-10-CM | POA: Diagnosis not present

## 2024-03-09 DIAGNOSIS — J9622 Acute and chronic respiratory failure with hypercapnia: Secondary | ICD-10-CM | POA: Diagnosis not present

## 2024-03-15 ENCOUNTER — Ambulatory Visit: Payer: Self-pay

## 2024-03-15 NOTE — Telephone Encounter (Signed)
 FYI Only or Action Required?: Action required by provider: request for appointment.  Patient was last seen in primary care on 09/07/2023 by Celestia Rosaline SQUIBB, NP.  Called Nurse Triage reporting No chief complaint on file..  Symptoms began today.  Interventions attempted: Nothing.  Symptoms are: gradually worsening.  Triage Disposition: Go to ED Now (or PCP Triage)  Patient/caregiver understands and will follow disposition?: Yes  Copied from CRM [id number]. Topic: Clinical - Red Word Triage >> Mar 15, 2024  4:14 PM Taleah C wrote: Red Word that prompted transfer to Nurse Triage: Yvette Gibbs nurse with [facility] called, 2 weeks post partum, severe migraine, bp 155/105, pulse was 85, temp 98.2, oxygen  98% Reason for Disposition  Systolic BP >= 160 OR Diastolic >= 110  Answer Assessment - Initial Assessment Questions Advised ED. Nurse offered to call 911, pt declines and reports will have husband to take to [facility] ED. Nurse advised pt to go to ED now, nurse offered again to call 911, pt declined.  Community nurse reports: Hx chf, astma, htn , dm Migraines last 2 weeks, ongoing Post partum 2 weeks No insulin , not refilled or any HTN medications; no meds. Community nurse reports patient walked to clinic, has no transportation or phone.   1. BLOOD PRESSURE: What is your blood pressure? Did you take at least two measurements 5 minutes apart?     Bp 155/105, 85 at 4PM auto bp, BG 146, 98.2 , 20 98%, 381 lbs 2. ONSET: When did you take your blood pressure?     166/110 currently 3. HOW: How did you take your blood pressure? (e.g., automatic home BP monitor, visiting nurse)     Bp automatic 4. HISTORY: Do you have a history of high blood pressure? Were you diagnosed with preeclampsia during this or previous pregnancies?     Pre E with this pregnancy 5. MEDICINES: Are you taking any medicines for blood pressure? Have you missed any doses recently?     no 6.  DELIVERY: When was your delivery date? Vaginal delivery or C-section? How many babies?  (e.g., single baby, twins)     Vaginal; 03/03/24 7. OTHER SYMPTOMS: Do you have any other symptoms? (e.g., abdomen pain, chest pain, ankle or leg swelling, difficulty breathing, hand or face swelling, headache, vision changes; feeling anxious, depressed, overwhelmed, or sad) Patient reports sob with exertion(uses inhaler/hx asthma)  Denies HA, blurred vision, no abdominal pain, dizziness, weakness, no swelling.  Protocols used: Postpartum - High Blood Pressure-A-AH

## 2024-03-15 NOTE — Telephone Encounter (Signed)
 Noted - agree with disposition

## 2024-03-16 NOTE — Congregational Nurse Program (Signed)
 Dept: [id number]   Congregational Nurse Program Note  Date of Encounter: 03/15/2024  Past Medical History: Past Medical History:  Diagnosis Date   Acute exacerbation of CHF (congestive heart failure) (HCC) 05/04/2021   Asthma    Depression    Diabetes mellitus type 2, uncontrolled    Extreme obesity    Family history of adverse reaction to anesthesia     my grandmother had an allergic reaction   Hypertension    Mental disorder    Obesity    OSA (obstructive sleep apnea)     Encounter Details:  Community Questionnaire - 03/15/24 1600       Questionnaire   Ask client: Do you give verbal consent for me to treat you today? Yes    Student Assistance N/A    Location Patient Served  [facility]    Encounter Setting CN site    Population Status Unknown    Insurance Medicaid   [place]   Insurance/Financial Assistance Referral [place] Patient Asst. Fund;[facility] Financial Assistance   informed patient to return so we can explore the possibility of applying for financial assistance programs on her behalf   Medication Have Medication Insecurities;Referred to Medication Assistance   patient not able to provide details about the medications she has been taking in the past. patient states she is out of medication and requires a refill.   Medical Provider Yes    Screening Referrals Made Annual Wellness Visit   contacted pt PCP office, they encouraged patient to go to the ER and follow up after discharge.   Medical Referrals Made ED;[facility] Virtual Visit;Urgent Care;[facility] Visit   several medical referrerals made. Congregational nurse was able to get into contact with patient PCP office. Triage Nurse from patient PCP informed patient to go to ER now. congregational nurse offered several times to call 911 for patient. Pt declined   Medical Appointment Completed N/A   pt decline nurse offer to call 911. patient  stated she would get husband to take her to [facility] ER   CNP Interventions Advocate/Support;Navigate Healthcare System;Case Management;Counsel;Educate;Spiritual Care;Reviewed New Diagnosis    Screenings CN Performed Blood Pressure;Blood Glucose;BMI;Weight    ED Visit Averted N/A   patient informed to go to ER. patient declined at this time   Life-Saving Intervention Made Yes          Vitals:   03/15/24 1625  Weight: (!) 381 lb (172.8 kg)  Height: 5' 2 (1.575 m)    Today's Vitals   03/15/24 1600 03/15/24 1625  BP: (!) 155/105 (!) 166/110  Pulse: 85 95  Resp: 20 20  Temp: 98.2 F (36.8 C) 98.2 F (36.8 C)  TempSrc: Temporal Temporal  SpO2: 98% 98%  Weight: (!) 381 lb (172.8 kg) (!) 381 lb (172.8 kg)  Height: 5' 2 (1.575 m) 5' 2 (1.575 m)  PainSc: 6  0-No pain  PainLoc: Head    Body mass index is 69.69 kg/m.   The patient presented to the health clinic with complaints of a migraine lasting two weeks. She describes the headache as sharp, shooting, constant and ongoing. She provided some background, stating she is engaged and has two children, a two-year-old and a two-week-old. The patient has a history of diabetes but did not require insulin  during her last pregnancy. She reported that her last visit to her primary care provider (PCP) was in June 2025, though clinic notes indicated she was seen on march  74,7974. The patient affirmed she has medicaid and has a follow up appointment with her OB/GYN scheduled for this upcoming Tuesday.   The patient states she is currently out of medication and could not provide details about any medication previously taken in the past. She indicated she has no cell phone or means of transportation. Additionally, the patient expressed difficulty accessing her MyChart account and will require assistance with this a later date. She denies experiencing blurry vision, dizziness, or weakness and shows no edema in her bilateral lower extremities. The  patient reports having a vaginal birth on 03-03-24. Her children were not present during this clinic visit.   During the visit, the clinicla team called the patient PCP office and spoke with the triage nurse.

## 2024-03-17 NOTE — Congregational Nurse Program (Signed)
 Dept: [phone]   Congregational Nurse Program Note  Date of Encounter: 03/15/2024  Past Medical History: Past Medical History:  Diagnosis Date   Acute exacerbation of CHF (congestive heart failure) (HCC) 05/04/2021   Asthma    Depression    Diabetes mellitus type 2, uncontrolled    Extreme obesity    Family history of adverse reaction to anesthesia     my grandmother had an allergic reaction   Hypertension    Mental disorder    Obesity    OSA (obstructive sleep apnea)     Encounter Details:  Community Questionnaire - 03/15/24 1601       Questionnaire   Ask client: Do you give verbal consent for me to treat you today? Yes    Student Assistance N/A    Location Patient Served  [facility]    Encounter Setting CN site    Population Status Unknown    Insurance Medicaid   pt [place]   Insurance/Financial Assistance Referral [place] Patient Asst. Fund;[facility] Financial Assistance   informed patient to return so we can explore the possibility of applying for financial assistance programs on her behalf   Medication Have Medication Insecurities;Referred to Medication Assistance   patient not able to provide details about the medications she has been taking in the past. patient states she is out of medication and requires a refill.   Medical Provider Yes    Screening Referrals Made Annual Wellness Visit   contacted pt PCP office, they encouraged patient to go to the ER and follow up after discharge.   Medical Referrals Made ED;[facility];Urgent Care;[facility] Visit   several medical referrerals made. Congregational nurse was able to get into contact with patient PCP office. Triage Nurse from patient PCP informed patient to go to ER now. congregational nurse offered several times to call 911 for patient. Pt declined   Medical Appointment Completed N/A   pt decline nurse offer to call 911. patient  stated she would get husband to take her to [facility] ER   CNP Interventions Advocate/Support;Navigate Healthcare System;Case Management;Counsel;Educate;Spiritual Care;Reviewed New Diagnosis    Screenings CN Performed Blood Pressure;Blood Glucose;BMI;Weight    ED Visit Averted N/A   patient informed to go to ER. patient declined at this time   Life-Saving Intervention Made Yes          Today's Vitals   03/15/24 0400 03/15/24 1625  BP: (!) 155/105 (!) 166/110  Pulse: 85 95  Resp: 20 20  Temp: 98.2 F (36.8 C) 98.2 F (36.8 C)  TempSrc: Temporal Temporal  SpO2: 98% 98%  Weight: (!) 381 lb (172.8 kg) (!) 381 lb (172.8 kg)  Height: 5' 2 (1.575 m) 5' 2 (1.575 m)  PainSc: 6  0-No pain  PainLoc: Head    Body mass index is 69.69 kg/m.      03/15/24 1625  Weight: (!) 381 lb (172.8 kg)  Height: 5' 2 (1.575 m)        Today's Vitals    03/15/24 1600 03/15/24 1625  BP: (!) 155/105 (!) 166/110  Pulse: 85 95  Resp: 20 20  Temp: 98.2 F (36.8 C) 98.2 F (36.8 C)  TempSrc: Temporal Temporal  SpO2: 98% 98%  Weight: (!) 381 lb (172.8 kg) (!) 381 lb (172.8 kg)  Height: 5' 2 (1.575 m) 5' 2 (1.575 m)  PainSc: 6  0-No pain  PainLoc: Head      Body mass  index is 69.69 kg/m.    Blood glucose obtained with a result of 246. (Obtained with a borrowed personal glucometer)   The patient presented to the health clinic with complaints of a migraine lasting two weeks. She describes the headache as sharp, shooting, constant and ongoing. She provided some background, stating she is engaged and has two children, a two-year-old and a two-week-old. The patient has a history of diabetes but did not require insulin  during her last pregnancy. She reported that her last visit to her primary care provider (PCP) was in June 2025, though clinic notes indicated she was seen on march 25,2025. The patient affirmed she has medicaid and has a follow up appointment with her OB/GYN scheduled for this  upcoming Tuesday.    The patient states she is currently out of medication and could not provide details about any medication previously taken in the past. She indicated she has no cell phone or means of transportation. Patient reluctance to provide her brothers phone number, who lives with her, despite acknowledging that he has a cell phone. Additionally, the patient expressed difficulty accessing her MyChart account and will require assistance with this a later date. She denies experiencing blurry vision, dizziness, or weakness and shows no edema in her bilateral lower extremities. The patient reports having a vaginal birth on 03-03-24. Her children were not present during this clinic visit.    During the visit, the clinicla team called the patient PCP office and spoke with the triage nurse Arlester RAMAN.)  The patient's vital signs were recorded, with a noted increase in blood pressure. At the time of the second reading, the patient reported that her headache was no longer present and denied any chest pain.   The patient was educated on the effects of hypertension and preeclampsia. Although she was offered assistance to call 911 several times, she declined and stated her husband would take her to the emergency room. She received education on the risk associated with uncontrolled hypertension. Additionally, the patient expressed feelings of hunger, and food was provided.   Plan:  -Encouraged the patient to follow up with here PCP after discharge from the hospital should she choose to go to the ER. -Explore financial assistance programs for food and transportation needs -Inform the patient of services available to assist in obtaining medication refills -Provide the patient with a QR code for [facility] virtual appointments and inform her of the local [facility] located in her neighborhood.   The patient is being monitored closely, with arrangements made for potential follow up care and community  resources to support her medical and social needs moving forward.

## 2024-03-26 DIAGNOSIS — Z419 Encounter for procedure for purposes other than remedying health state, unspecified: Secondary | ICD-10-CM | POA: Diagnosis not present

## 2024-03-30 ENCOUNTER — Ambulatory Visit

## 2024-03-30 DIAGNOSIS — Z23 Encounter for immunization: Secondary | ICD-10-CM

## 2024-04-08 DIAGNOSIS — J9622 Acute and chronic respiratory failure with hypercapnia: Secondary | ICD-10-CM | POA: Diagnosis not present

## 2024-04-19 ENCOUNTER — Encounter (HOSPITAL_COMMUNITY): Payer: Self-pay

## 2024-04-19 ENCOUNTER — Other Ambulatory Visit: Payer: Self-pay

## 2024-04-19 ENCOUNTER — Emergency Department (HOSPITAL_COMMUNITY)
Admission: EM | Admit: 2024-04-19 | Discharge: 2024-04-20 | Disposition: A | Discharge status: Home / Self Care | Attending: Emergency Medicine | Admitting: Emergency Medicine

## 2024-04-19 ENCOUNTER — Emergency Department (HOSPITAL_COMMUNITY)

## 2024-04-19 DIAGNOSIS — R059 Cough, unspecified: Secondary | ICD-10-CM | POA: Diagnosis not present

## 2024-04-19 DIAGNOSIS — Z794 Long term (current) use of insulin: Secondary | ICD-10-CM | POA: Insufficient documentation

## 2024-04-19 DIAGNOSIS — R809 Proteinuria, unspecified: Secondary | ICD-10-CM | POA: Insufficient documentation

## 2024-04-19 DIAGNOSIS — M62838 Other muscle spasm: Secondary | ICD-10-CM | POA: Insufficient documentation

## 2024-04-19 DIAGNOSIS — R519 Headache, unspecified: Secondary | ICD-10-CM | POA: Diagnosis not present

## 2024-04-19 DIAGNOSIS — R112 Nausea with vomiting, unspecified: Secondary | ICD-10-CM | POA: Insufficient documentation

## 2024-04-19 DIAGNOSIS — R6889 Other general symptoms and signs: Secondary | ICD-10-CM

## 2024-04-19 DIAGNOSIS — R0981 Nasal congestion: Secondary | ICD-10-CM | POA: Diagnosis not present

## 2024-04-19 DIAGNOSIS — R197 Diarrhea, unspecified: Secondary | ICD-10-CM | POA: Diagnosis not present

## 2024-04-19 LAB — CBC WITH DIFFERENTIAL/PLATELET
Abs Immature Granulocytes: 0.01 K/uL (ref 0.00–0.07)
Basophils Absolute: 0 K/uL (ref 0.0–0.1)
Basophils Relative: 1 %
Eosinophils Absolute: 0.1 K/uL (ref 0.0–0.5)
Eosinophils Relative: 3 %
HCT: 40.1 % (ref 36.0–46.0)
Hemoglobin: 12.6 g/dL (ref 12.0–15.0)
Immature Granulocytes: 0 %
Lymphocytes Relative: 38 %
Lymphs Abs: 1.4 K/uL (ref 0.7–4.0)
MCH: 28.4 pg (ref 26.0–34.0)
MCHC: 31.4 g/dL (ref 30.0–36.0)
MCV: 90.3 fL (ref 80.0–100.0)
Monocytes Absolute: 0.4 K/uL (ref 0.1–1.0)
Monocytes Relative: 12 %
Neutro Abs: 1.6 K/uL — ABNORMAL LOW (ref 1.7–7.7)
Neutrophils Relative %: 46 %
Platelets: 247 K/uL (ref 150–400)
RBC: 4.44 MIL/uL (ref 3.87–5.11)
RDW: 12.6 % (ref 11.5–15.5)
WBC: 3.5 K/uL — ABNORMAL LOW (ref 4.0–10.5)
nRBC: 0 % (ref 0.0–0.2)

## 2024-04-19 LAB — RESP PANEL BY RT-PCR (RSV, FLU A&B, COVID)  RVPGX2
Influenza A by PCR: NEGATIVE
Influenza B by PCR: NEGATIVE
Resp Syncytial Virus by PCR: NEGATIVE
SARS Coronavirus 2 by RT PCR: NEGATIVE

## 2024-04-19 LAB — COMPREHENSIVE METABOLIC PANEL WITH GFR
ALT: 41 U/L (ref 0–44)
AST: 32 U/L (ref 15–41)
Albumin: 3.4 g/dL — ABNORMAL LOW (ref 3.5–5.0)
Alkaline Phosphatase: 58 U/L (ref 38–126)
Anion gap: 9 (ref 5–15)
BUN: 7 mg/dL (ref 6–20)
CO2: 25 mmol/L (ref 22–32)
Calcium: 8.7 mg/dL — ABNORMAL LOW (ref 8.9–10.3)
Chloride: 103 mmol/L (ref 98–111)
Creatinine, Ser: 0.66 mg/dL (ref 0.44–1.00)
GFR, Estimated: >60 mL/min (ref >60)
Glucose, Bld: 147 mg/dL — ABNORMAL HIGH (ref 70–99)
Potassium: 3.8 mmol/L (ref 3.5–5.1)
Sodium: 137 mmol/L (ref 135–145)
Total Bilirubin: 0.6 mg/dL (ref 0.0–1.2)
Total Protein: 6.7 g/dL (ref 6.5–8.1)

## 2024-04-19 LAB — HCG, SERUM, QUALITATIVE: Preg, Serum: NEGATIVE

## 2024-04-19 LAB — LIPASE, BLOOD: Lipase: 25 U/L (ref 11–51)

## 2024-04-19 LAB — TROPONIN I (HIGH SENSITIVITY): Troponin I (High Sensitivity): 5 ng/L (ref <18)

## 2024-04-19 NOTE — ED Provider Triage Note (Signed)
 Emergency Medicine Provider Triage Evaluation Note  Yvette Gibbs , a 33 y.o. female  was evaluated in triage.  Pt complains of 2.5 weeks of dry cough, congestion, headache (pulsatile, light and sound sensitivity.) chest tightness, diarrhea, subjective fevers, n/v x 3 episodes with last episode this morning. Significant other was sick with URI. Said she had 1 episode of near syncope after standing up and called EMS.   Denies vision changes, hemoptysis, rashes, LE swelling  Review of Systems  Positive: N/a. Negative: N/a  Physical Exam  BP (!) 166/109 (BP Location: Right Arm)   Pulse 75   Temp 99 F (37.2 C) (Oral)   Resp 16   Ht 5' 2 (1.575 m)   Wt (!) 146.5 kg   LMP 03/28/2024 (Approximate)   SpO2 100%   BMI 59.08 kg/m  Gen:   Awake, no distress   Resp:  Normal effort  MSK:   Moves extremities without difficulty  Other:    Medical Decision Making  Medically screening exam initiated at 3:07 PM.  Appropriate orders placed.  Yvette Gibbs was informed that the remainder of the evaluation will be completed by another provider, this initial triage assessment does not replace that evaluation, and the importance of remaining in the ED until their evaluation is complete.     Yvette Gibbs, [place] 04/19/24 1510

## 2024-04-19 NOTE — ED Triage Notes (Addendum)
 C/O chest tightness, SHOB, nausea, diarrhea, headache, and fever for 2 weeks. Boyfriend that lives with pt has been sick. Axox4. VSS. EMS gave 650 of tylenol 

## 2024-04-20 DIAGNOSIS — R059 Cough, unspecified: Secondary | ICD-10-CM | POA: Diagnosis not present

## 2024-04-20 LAB — URINALYSIS, ROUTINE W REFLEX MICROSCOPIC
Bilirubin Urine: NEGATIVE
Glucose, UA: NEGATIVE mg/dL
Ketones, ur: 20 mg/dL — AB
Leukocytes,Ua: NEGATIVE
Nitrite: NEGATIVE
Protein, ur: >=300 mg/dL — AB
Specific Gravity, Urine: 1.03 (ref 1.005–1.030)
pH: 5 (ref 5.0–8.0)

## 2024-04-20 LAB — TROPONIN I (HIGH SENSITIVITY): Troponin I (High Sensitivity): 4 ng/L (ref <18)

## 2024-04-20 MED ORDER — ONDANSETRON 4 MG PO TBDP
ORAL_TABLET | ORAL | 0 refills | Status: AC
Start: 2024-04-20 — End: ?

## 2024-04-20 MED ORDER — ONDANSETRON HCL 4 MG/2ML IJ SOLN
4.0000 mg | Freq: Once | INTRAMUSCULAR | Status: AC
  Administered 2024-04-20: 4 mg via INTRAVENOUS
  Filled 2024-04-20: qty 2

## 2024-04-20 MED ORDER — ALBUTEROL SULFATE HFA 108 (90 BASE) MCG/ACT IN AERS
2.0000 | INHALATION_SPRAY | RESPIRATORY_TRACT | Status: DC | PRN
  Administered 2024-04-20: 2 via RESPIRATORY_TRACT
  Filled 2024-04-20: qty 6.7

## 2024-04-20 MED ORDER — ACETAMINOPHEN 325 MG PO TABS
650.0000 mg | ORAL_TABLET | Freq: Once | ORAL | Status: AC
  Administered 2024-04-20: 650 mg via ORAL

## 2024-04-20 MED ORDER — PREDNISONE 20 MG PO TABS
60.0000 mg | ORAL_TABLET | Freq: Once | ORAL | Status: AC
  Administered 2024-04-20: 60 mg via ORAL
  Filled 2024-04-20: qty 3

## 2024-04-20 MED ORDER — ACETAMINOPHEN 325 MG PO TABS
325.0000 mg | ORAL_TABLET | Freq: Once | ORAL | Status: DC
  Filled 2024-04-20: qty 1

## 2024-04-20 MED ORDER — SODIUM CHLORIDE 0.9 % IV BOLUS
1000.0000 mL | Freq: Once | INTRAVENOUS | Status: AC
  Administered 2024-04-20: 1000 mL via INTRAVENOUS

## 2024-04-20 MED ORDER — ACETAMINOPHEN 650 MG RE SUPP: 650.0000 mg | Freq: Once | RECTAL | Status: DC

## 2024-04-20 MED ORDER — KETOROLAC TROMETHAMINE 30 MG/ML IJ SOLN
15.0000 mg | Freq: Once | INTRAMUSCULAR | Status: AC
  Administered 2024-04-20: 15 mg via INTRAVENOUS
  Filled 2024-04-20: qty 1

## 2024-04-20 MED ORDER — DIPHENOXYLATE-ATROPINE 2.5-0.025 MG PO TABS
2.0000 | ORAL_TABLET | Freq: Once | ORAL | Status: AC
  Administered 2024-04-20: 2 via ORAL
  Filled 2024-04-20: qty 2

## 2024-04-20 MED ORDER — DIPHENOXYLATE-ATROPINE 2.5-0.025 MG PO TABS: 1.0000 | ORAL_TABLET | Freq: Four times a day (QID) | ORAL | 0 refills | Status: AC | PRN

## 2024-04-20 MED ORDER — PREDNISONE 20 MG PO TABS: 40.0000 mg | ORAL_TABLET | Freq: Every day | ORAL | 0 refills | Status: AC

## 2024-04-20 NOTE — ED Provider Notes (Signed)
 [place] EMERGENCY DEPARTMENT AT [facility] Provider Note   CSN: [id number] Arrival date & time: 04/19/24  1421     Patient presents with: Cold/Flu Symptoms   Yvette Gibbs is a 33 y.o. female.   Patient presents to the emergency department for evaluation of flulike illness.  Patient reports that she has been sick for 2 weeks.  She has been experiencing cough, congestion, muscle aches and spasms, headache, nausea, vomiting, diarrhea.       Prior to Admission medications   Medication Sig Start Date End Date Taking? Authorizing Provider  diphenoxylate-atropine (LOMOTIL) 2.5-0.025 MG tablet Take 1-2 tablets by mouth 4 (four) times daily as needed for diarrhea or loose stools. 04/20/24  Yes Hrishikesh Hoeg, Lonni PARAS, MD  ondansetron  (ZOFRAN -ODT) 4 MG disintegrating tablet 4mg  ODT q4 hours prn nausea/vomit 04/20/24  Yes Ruqayya Ventress, Lonni PARAS, MD  predniSONE  (DELTASONE ) 20 MG tablet Take 2 tablets (40 mg total) by mouth daily with breakfast. 04/20/24  Yes Felicie Kocher, Lonni PARAS, MD  acetaminophen  (TYLENOL ) 500 MG tablet Take 1,000 mg by mouth every 6 (six) hours as needed for mild pain (pain score 1-3), fever or headache.    [provider]  albuterol  (PROVENTIL ) (2.5 MG/3ML) 0.083% nebulizer solution Use 1 vial (2.5 mg total) by nebulization every 4 (four) hours as needed for wheezing or shortness of breath. Do not use in addition to albuterol  rescue inhaler 12/11/22 10/13/23  Newlin, Enobong, MD  albuterol  (VENTOLIN  HFA) 108 (90 Base) MCG/ACT inhaler Inhale 2 puffs into the lungs every 6 (six) hours as needed for wheezing or shortness of breath (Do not use in addition to albuterol  nebulizer). 08/27/22   Briana Elgin LABOR, MD  budesonide -formoterol  (SYMBICORT ) 160-4.5 MCG/ACT inhaler Inhale 2 puffs into the lungs 2 (two) times daily. 07/16/23 08/15/23  Leotis Bogus, MD  cetirizine  (ZYRTEC ) 10 MG tablet Take 1 tablet (10 mg total) by mouth daily. 09/07/23   Celestia Rosaline SQUIBB,  NP  cyclobenzaprine  (FLEXERIL ) 5 MG tablet Take 1 tablet (5 mg total) by mouth 3 (three) times daily as needed for muscle spasms. 12/11/22   Newlin, Enobong, MD  guaiFENesin -dextromethorphan  (ROBITUSSIN DM) 100-10 MG/5ML syrup Take 5 mLs by mouth every 4 (four) hours as needed for cough. 07/16/23   Leotis Bogus, MD  hydrochlorothiazide  (HYDRODIURIL ) 25 MG tablet Take 1 tablet (25 mg total) by mouth daily. 09/07/23   Celestia Rosaline SQUIBB, NP  ibuprofen  (ADVIL ) 800 MG tablet Take 1 tablet (800 mg total) by mouth every 8 (eight) hours as needed. 09/07/23   Celestia Rosaline SQUIBB, NP  insulin  glargine (LANTUS  SOLOSTAR) 100 UNIT/ML Solostar Pen Inject 25 Units into the skin daily. 07/16/23 08/15/23  Leotis Bogus, MD  losartan  (COZAAR ) 25 MG tablet Take 1 tablet (25 mg total) by mouth daily. 09/07/23 10/07/23  Celestia Rosaline SQUIBB, NP  meloxicam  (MOBIC ) 7.5 MG tablet Take 1 tablet (7.5 mg total) by mouth daily. 11/18/23   Smoot, Lauraine LABOR, PA-C  rosuvastatin  (CRESTOR ) 40 MG tablet Take 1 tablet (40 mg total) by mouth daily. 09/10/23   Celestia Rosaline SQUIBB, NP  Semaglutide ,0.25 or 0.5MG /DOS, (OZEMPIC , 0.25 OR 0.5 MG/DOSE,) 2 MG/3ML SOPN Inject 0.25 mg into the skin once a week. 09/07/23   Celestia Rosaline SQUIBB, NP    Allergies: Shellfish allergy, Bee pollen, Fish allergy, Metformin  and related, and Zestril  [lisinopril ]    Review of Systems  Updated Vital Signs BP (!) 152/100 (BP Location: Right Arm)   Pulse 78   Temp 98.1 F (36.7 C)  Resp 20   Ht 5' 2 (1.575 m)   Wt (!) 146.5 kg   LMP 03/28/2024 (Approximate)   SpO2 99%   BMI 59.08 kg/m   Physical Exam Vitals and nursing note reviewed.  Constitutional:      General: She is not in acute distress.    Appearance: She is well-developed.  HENT:     Head: Normocephalic and atraumatic.     Mouth/Throat:     Mouth: Mucous membranes are moist.  Eyes:     General: Vision grossly intact. Gaze aligned appropriately.     Extraocular Movements: Extraocular  movements intact.     Conjunctiva/sclera: Conjunctivae normal.  Cardiovascular:     Rate and Rhythm: Normal rate and regular rhythm.     Pulses: Normal pulses.     Heart sounds: Normal heart sounds, S1 normal and S2 normal. No murmur heard.    No friction rub. No gallop.  Pulmonary:     Effort: Pulmonary effort is normal. No respiratory distress.     Breath sounds: Normal breath sounds.  Abdominal:     General: Bowel sounds are normal.     Palpations: Abdomen is soft.     Tenderness: There is no abdominal tenderness. There is no guarding or rebound.     Hernia: No hernia is present.  Musculoskeletal:        General: No swelling.     Cervical back: Full passive range of motion without pain, normal range of motion and neck supple. No spinous process tenderness or muscular tenderness. Normal range of motion.     Right lower leg: No edema.     Left lower leg: No edema.  Skin:    General: Skin is warm and dry.     Capillary Refill: Capillary refill takes less than 2 seconds.     Findings: No ecchymosis, erythema, rash or wound.  Neurological:     General: No focal deficit present.     Mental Status: She is alert and oriented to person, place, and time.     GCS: GCS eye subscore is 4. GCS verbal subscore is 5. GCS motor subscore is 6.     Cranial Nerves: Cranial nerves 2-12 are intact.     Sensory: Sensation is intact.     Motor: Motor function is intact.     Coordination: Coordination is intact.  Psychiatric:        Attention and Perception: Attention normal.        Mood and Affect: Mood normal.        Speech: Speech normal.        Behavior: Behavior normal.     (all labs ordered are listed, but only abnormal results are displayed) Labs Reviewed  CBC WITH DIFFERENTIAL/PLATELET - Abnormal; Notable for the following components:      Result Value   WBC 3.5 (*)    Neutro Abs 1.6 (*)    All other components within normal limits  COMPREHENSIVE METABOLIC PANEL WITH GFR - Abnormal;  Notable for the following components:   Glucose, Bld 147 (*)    Calcium  8.7 (*)    Albumin 3.4 (*)    All other components within normal limits  URINALYSIS, ROUTINE W REFLEX MICROSCOPIC - Abnormal; Notable for the following components:   Color, Urine AMBER (*)    APPearance HAZY (*)    Hgb urine dipstick SMALL (*)    Ketones, ur 20 (*)    Protein, ur >=300 (*)    Bacteria, UA  RARE (*)    All other components within normal limits  RESP PANEL BY RT-PCR (RSV, FLU A&B, COVID)  RVPGX2  LIPASE, BLOOD  HCG, SERUM, QUALITATIVE  TROPONIN I (HIGH SENSITIVITY)  TROPONIN I (HIGH SENSITIVITY)    EKG: EKG Interpretation Date/Time:  Wednesday April 19 2024 14:30:19 EST Ventricular Rate:  80 PR Interval:  180 QRS Duration:  120 QT Interval:  420 QTC Calculation: 484 R Axis:   -5  Text Interpretation: Normal sinus rhythm Right bundle branch block Abnormal ECG When compared with ECG of 11-Jul-2023 14:44, No acute changes Confirmed by Haze Lonni PARAS [phone]) on 04/20/2024 3:40:28 AM  Radiology: [facility] Chest 2 View Result Date: 04/19/2024 CLINICAL DATA:  Chest pain and short of breath EXAM: CHEST - 2 VIEW COMPARISON:  07/11/2023 FINDINGS: Frontal and lateral views of the chest are obtained. Evaluation is limited by positioning and body habitus. Cardiac silhouette is enlarged, likely exaggerated due to AP positioning. No airspace disease, effusion, or pneumothorax. No acute bony abnormalities. IMPRESSION: 1. No acute intrathoracic process. Electronically Signed   By: Ozell Daring M.D.   On: 04/19/2024 15:39     Procedures   Medications Ordered in the ED  albuterol  (VENTOLIN  HFA) 108 (90 Base) MCG/ACT inhaler 2 puff (2 puffs Inhalation Given 04/20/24 0411)  acetaminophen  (TYLENOL ) tablet 650 mg (650 mg Oral Given 04/20/24 0047)  sodium chloride  0.9 % bolus 1,000 mL (1,000 mLs Intravenous New Bag/Given 04/20/24 0417)  ondansetron  (ZOFRAN ) injection 4 mg (4 mg Intravenous Given 04/20/24 0410)   diphenoxylate-atropine (LOMOTIL) 2.5-0.025 MG per tablet 2 tablet (2 tablets Oral Given 04/20/24 0459)  ketorolac  (TORADOL ) 30 MG/ML injection 15 mg (15 mg Intravenous Given 04/20/24 0410)  predniSONE  (DELTASONE ) tablet 60 mg (60 mg Oral Given 04/20/24 0410)                                    Medical Decision Making Risk OTC drugs. Prescription drug management.   Differential Diagnosis considered includes, but not limited to: COVID-19; influenza; RSV; simple viral URI; strep pharyngitis; pneumonia  Presents to the emergency department for evaluation of flulike illness.  She appears well.  She is afebrile.  Oxygenation is normal, no respiratory distress.  Lab work has been reassuring.  Urinalysis shows concentration, likely dehydration.  There is also proteinuria.  Chest x-ray without evidence of pneumonia.  No active wheezing but she does report a history of asthma.  Patient treated with IV fluids, analgesia, antiemetics, steroids and albuterol .     Final diagnoses:  Flu-like symptoms  Proteinuria, unspecified type    ED Discharge Orders          Ordered    predniSONE  (DELTASONE ) 20 MG tablet  Daily with breakfast        04/20/24 0608    ondansetron  (ZOFRAN -ODT) 4 MG disintegrating tablet        04/20/24 0608    diphenoxylate-atropine (LOMOTIL) 2.5-0.025 MG tablet  4 times daily PRN        04/20/24 9391               Haze Lonni PARAS, MD 04/20/24 [phone]

## 2024-04-20 NOTE — ED Notes (Signed)
 Pt given sandwich bag by NT

## 2024-04-22 ENCOUNTER — Emergency Department (HOSPITAL_COMMUNITY)
Admission: EM | Admit: 2024-04-22 | Discharge: 2024-04-22 | Disposition: A | Discharge status: Home / Self Care | Attending: Emergency Medicine | Admitting: Emergency Medicine

## 2024-04-22 ENCOUNTER — Encounter (HOSPITAL_COMMUNITY): Payer: Self-pay | Admitting: Emergency Medicine

## 2024-04-22 ENCOUNTER — Emergency Department (HOSPITAL_COMMUNITY)

## 2024-04-22 ENCOUNTER — Other Ambulatory Visit: Payer: Self-pay

## 2024-04-22 DIAGNOSIS — Z794 Long term (current) use of insulin: Secondary | ICD-10-CM | POA: Insufficient documentation

## 2024-04-22 DIAGNOSIS — J069 Acute upper respiratory infection, unspecified: Secondary | ICD-10-CM | POA: Diagnosis not present

## 2024-04-22 DIAGNOSIS — J45909 Unspecified asthma, uncomplicated: Secondary | ICD-10-CM | POA: Insufficient documentation

## 2024-04-22 DIAGNOSIS — E119 Type 2 diabetes mellitus without complications: Secondary | ICD-10-CM | POA: Diagnosis not present

## 2024-04-22 DIAGNOSIS — R0600 Dyspnea, unspecified: Secondary | ICD-10-CM

## 2024-04-22 DIAGNOSIS — Z79899 Other long term (current) drug therapy: Secondary | ICD-10-CM | POA: Diagnosis not present

## 2024-04-22 DIAGNOSIS — R519 Headache, unspecified: Secondary | ICD-10-CM

## 2024-04-22 DIAGNOSIS — I509 Heart failure, unspecified: Secondary | ICD-10-CM | POA: Insufficient documentation

## 2024-04-22 DIAGNOSIS — I110 Hypertensive heart disease with heart failure: Secondary | ICD-10-CM | POA: Diagnosis not present

## 2024-04-22 DIAGNOSIS — R509 Fever, unspecified: Secondary | ICD-10-CM | POA: Diagnosis present

## 2024-04-22 LAB — CBC WITH DIFFERENTIAL/PLATELET
Abs Immature Granulocytes: 0 K/uL (ref 0.00–0.07)
Basophils Absolute: 0 K/uL (ref 0.0–0.1)
Basophils Relative: 1 %
Eosinophils Absolute: 0.2 K/uL (ref 0.0–0.5)
Eosinophils Relative: 4 %
HCT: 39.9 % (ref 36.0–46.0)
Hemoglobin: 12.6 g/dL (ref 12.0–15.0)
Immature Granulocytes: 0 %
Lymphocytes Relative: 28 %
Lymphs Abs: 1.3 K/uL (ref 0.7–4.0)
MCH: 28.8 pg (ref 26.0–34.0)
MCHC: 31.6 g/dL (ref 30.0–36.0)
MCV: 91.3 fL (ref 80.0–100.0)
Monocytes Absolute: 0.7 K/uL (ref 0.1–1.0)
Monocytes Relative: 15 %
Neutro Abs: 2.4 K/uL (ref 1.7–7.7)
Neutrophils Relative %: 52 %
Platelets: 237 K/uL (ref 150–400)
RBC: 4.37 MIL/uL (ref 3.87–5.11)
RDW: 12.8 % (ref 11.5–15.5)
WBC: 4.6 K/uL (ref 4.0–10.5)
nRBC: 0 % (ref 0.0–0.2)

## 2024-04-22 LAB — BASIC METABOLIC PANEL WITH GFR
Anion gap: 11 (ref 5–15)
BUN: 8 mg/dL (ref 6–20)
CO2: 26 mmol/L (ref 22–32)
Calcium: 9.1 mg/dL (ref 8.9–10.3)
Chloride: 102 mmol/L (ref 98–111)
Creatinine, Ser: 0.68 mg/dL (ref 0.44–1.00)
GFR, Estimated: >60 mL/min (ref >60)
Glucose, Bld: 162 mg/dL — ABNORMAL HIGH (ref 70–99)
Potassium: 3.7 mmol/L (ref 3.5–5.1)
Sodium: 139 mmol/L (ref 135–145)

## 2024-04-22 LAB — MONONUCLEOSIS SCREEN: Mono Screen: NEGATIVE

## 2024-04-22 LAB — RESP PANEL BY RT-PCR (RSV, FLU A&B, COVID)  RVPGX2
Influenza A by PCR: NEGATIVE
Influenza B by PCR: NEGATIVE
Resp Syncytial Virus by PCR: NEGATIVE
SARS Coronavirus 2 by RT PCR: NEGATIVE

## 2024-04-22 LAB — HCG, QUANTITATIVE, PREGNANCY: hCG, Beta Chain, Quant, S: <1 m[IU]/mL (ref <5)

## 2024-04-22 MED ORDER — LACTATED RINGERS IV BOLUS
1000.0000 mL | Freq: Once | INTRAVENOUS | Status: AC
  Administered 2024-04-22: 1000 mL via INTRAVENOUS

## 2024-04-22 MED ORDER — PROCHLORPERAZINE EDISYLATE 10 MG/2ML IJ SOLN
10.0000 mg | Freq: Once | INTRAMUSCULAR | Status: AC
  Administered 2024-04-22: 10 mg via INTRAVENOUS
  Filled 2024-04-22: qty 2

## 2024-04-22 MED ORDER — DIPHENHYDRAMINE HCL 50 MG/ML IJ SOLN
25.0000 mg | Freq: Once | INTRAMUSCULAR | Status: AC
  Administered 2024-04-22: 25 mg via INTRAVENOUS
  Filled 2024-04-22: qty 1

## 2024-04-22 MED ORDER — IPRATROPIUM-ALBUTEROL 0.5-2.5 (3) MG/3ML IN SOLN
3.0000 mL | Freq: Once | RESPIRATORY_TRACT | Status: AC
  Administered 2024-04-22: 3 mL via RESPIRATORY_TRACT
  Filled 2024-04-22: qty 3

## 2024-04-22 NOTE — ED Triage Notes (Signed)
 Pt BIB [facility] coming from home with c/o of SHOB, weakness, and generalized pain 10/10. Seen here on Wednesday for the same and requests to be reevaluated.  EMS: 138sys 88HR 98%RA

## 2024-04-22 NOTE — ED Notes (Signed)
 Patient transported to X-ray

## 2024-04-22 NOTE — Discharge Instructions (Addendum)
 The workup in the emergency room is reassuring.  For your headaches, we have put in a neurology referral.    Please return to the ER if the headache gets severe and in not improving, you have associated new one sided numbness, tingling, weakness or confusion, seizures, poor balance or poor vision.

## 2024-04-22 NOTE — ED Notes (Addendum)
 O2 remained between 94-97% while ambulating.  Pt. Did complain of some dizziness.

## 2024-04-22 NOTE — ED Notes (Signed)
 Pt given gatorade and sack lunch. Pt encouraged to drink the entirety of the gatorade @ 355 mL. Pt also given approx 10 oz of fluids in cup. Pt tolerating well and eating turkey sandwich.

## 2024-04-22 NOTE — ED Provider Notes (Signed)
 [place] EMERGENCY DEPARTMENT AT [facility] Provider Note   CSN: [id number] Arrival date & time: 04/22/24  1531     Patient presents with: Shortness of Breath   Yvette Gibbs is a 33 y.o. female.  {Add pertinent medical, surgical, social history, OB history to HPI:32947} HPI     Yvette Gibbs is a 33 y.o. female who complains of congestion, dry cough, myalgias, headache, and fever for >10 days.  Patient also has associated shortness of breath and chest heaviness. She has a history of asthma, CHF, hypertension and hyperlipidemia, diabetes.  Patient seen in the emergency room recently.  She indicated that she was told that she did not have fluid.  In the past, she was told the same, but had to return to the ER and was found to have flu and pneumonia and required admission for few days.  She reports that she had temp of 102 at home.  Pt has no hx of PE, DVT and denies any exogenous hormone (testosterone / estrogen) use, long distance travels or surgery in the past 6 weeks, active cancer, recent immobilization.  Patient's headaches are generalized and constant.  They are not worse at night or with laying down.  Patient denies any associated numbness, tingling.  Prior to Admission medications   Medication Sig Start Date End Date Taking? Authorizing Provider  acetaminophen  (TYLENOL ) 500 MG tablet Take 1,000 mg by mouth every 6 (six) hours as needed for mild pain (pain score 1-3), fever or headache.    [provider]  albuterol  (PROVENTIL ) (2.5 MG/3ML) 0.083% nebulizer solution Use 1 vial (2.5 mg total) by nebulization every 4 (four) hours as needed for wheezing or shortness of breath. Do not use in addition to albuterol  rescue inhaler 12/11/22 10/13/23  Newlin, Enobong, MD  albuterol  (VENTOLIN  HFA) 108 (90 Base) MCG/ACT inhaler Inhale 2 puffs into the lungs every 6 (six) hours as needed for wheezing or shortness of breath (Do not use in addition to albuterol   nebulizer). 08/27/22   Briana Elgin LABOR, MD  budesonide -formoterol  (SYMBICORT ) 160-4.5 MCG/ACT inhaler Inhale 2 puffs into the lungs 2 (two) times daily. 07/16/23 08/15/23  Leotis Bogus, MD  cetirizine  (ZYRTEC ) 10 MG tablet Take 1 tablet (10 mg total) by mouth daily. 09/07/23   Celestia Rosaline SQUIBB, NP  cyclobenzaprine  (FLEXERIL ) 5 MG tablet Take 1 tablet (5 mg total) by mouth 3 (three) times daily as needed for muscle spasms. 12/11/22   Newlin, Enobong, MD  diphenoxylate-atropine (LOMOTIL) 2.5-0.025 MG tablet Take 1-2 tablets by mouth 4 (four) times daily as needed for diarrhea or loose stools. 04/20/24   Haze Lonni PARAS, MD  guaiFENesin -dextromethorphan  (ROBITUSSIN DM) 100-10 MG/5ML syrup Take 5 mLs by mouth every 4 (four) hours as needed for cough. 07/16/23   Leotis Bogus, MD  hydrochlorothiazide  (HYDRODIURIL ) 25 MG tablet Take 1 tablet (25 mg total) by mouth daily. 09/07/23   Celestia Rosaline SQUIBB, NP  ibuprofen  (ADVIL ) 800 MG tablet Take 1 tablet (800 mg total) by mouth every 8 (eight) hours as needed. 09/07/23   Celestia Rosaline SQUIBB, NP  insulin  glargine (LANTUS  SOLOSTAR) 100 UNIT/ML Solostar Pen Inject 25 Units into the skin daily. 07/16/23 08/15/23  Leotis Bogus, MD  losartan  (COZAAR ) 25 MG tablet Take 1 tablet (25 mg total) by mouth daily. 09/07/23 10/07/23  Celestia Rosaline SQUIBB, NP  meloxicam  (MOBIC ) 7.5 MG tablet Take 1 tablet (7.5 mg total) by mouth daily. 11/18/23   Smoot, Lauraine LABOR, PA-C  ondansetron  (ZOFRAN -ODT) 4 MG disintegrating tablet  4mg  ODT q4 hours prn nausea/vomit 04/20/24   Pollina, Lonni PARAS, MD  predniSONE  (DELTASONE ) 20 MG tablet Take 2 tablets (40 mg total) by mouth daily with breakfast. 04/20/24   Pollina, Lonni PARAS, MD  rosuvastatin  (CRESTOR ) 40 MG tablet Take 1 tablet (40 mg total) by mouth daily. 09/10/23   Celestia Rosaline SQUIBB, NP  Semaglutide ,0.25 or 0.5MG /DOS, (OZEMPIC , 0.25 OR 0.5 MG/DOSE,) 2 MG/3ML SOPN Inject 0.25 mg into the skin once a week. 09/07/23   Celestia Rosaline SQUIBB, NP    Allergies: Shellfish allergy, Bee pollen, Fish allergy, Metformin  and related, and Zestril  [lisinopril ]    Review of Systems  All other systems reviewed and are negative.   Updated Vital Signs BP (!) 156/98   Pulse 81   Temp 98.2 F (36.8 C) (Oral)   Resp 17   Ht 5' 2 (1.575 m)   Wt (!) 146.5 kg   LMP 03/28/2024 (Approximate)   SpO2 96%   BMI 59.07 kg/m   Physical Exam Vitals and nursing note reviewed.  Constitutional:      Appearance: She is well-developed.     Comments: Elevated BMI  HENT:     Head: Atraumatic.  Cardiovascular:     Rate and Rhythm: Normal rate.  Pulmonary:     Effort: Pulmonary effort is normal.     Breath sounds: No decreased breath sounds, wheezing, rhonchi or rales.  Musculoskeletal:     Cervical back: Normal range of motion and neck supple.     Right lower leg: No edema.     Left lower leg: No edema.  Skin:    General: Skin is warm and dry.  Neurological:     Mental Status: She is alert and oriented to person, place, and time.     (all labs ordered are listed, but only abnormal results are displayed) Labs Reviewed  BASIC METABOLIC PANEL WITH GFR - Abnormal; Notable for the following components:      Result Value   Glucose, Bld 162 (*)    All other components within normal limits  RESP PANEL BY RT-PCR (RSV, FLU A&B, COVID)  [id number]  CBC WITH DIFFERENTIAL/PLATELET  HCG, QUANTITATIVE, PREGNANCY  MONONUCLEOSIS SCREEN    EKG: EKG Interpretation Date/Time:  Saturday April 22 2024 18:34:29 EST Ventricular Rate:  77 PR Interval:  132 QRS Duration:  118 QT Interval:  399 QTC Calculation: 452 R Axis:   -24  Text Interpretation: Sinus rhythm Incomplete right bundle branch block No acute changes Confirmed by Charlyn Sora ([id number]) on 04/22/2024 7:40:35 PM  Radiology: [facility] Chest 2 View Result Date: 04/22/2024 CLINICAL DATA:  Cough, shortness of breath. EXAM: CHEST - 2 VIEW COMPARISON:  04/19/2024. FINDINGS:  Examination is limited due to patient's body habitus. The heart is enlarged and the mediastinal contour is within normal limits. Lung volumes are low. No consolidation, effusion, or pneumothorax is seen. No acute osseous abnormality. IMPRESSION: No active cardiopulmonary disease. Electronically Signed   By: Leita Birmingham M.D.   On: 04/22/2024 16:38    {Document cardiac monitor, telemetry assessment procedure when appropriate:32947} Procedures   Medications Ordered in the ED  diphenhydrAMINE  (BENADRYL ) injection 25 mg (25 mg Intravenous Given 04/22/24 1614)  prochlorperazine  (COMPAZINE ) injection 10 mg (10 mg Intravenous Given 04/22/24 1613)  lactated ringers  bolus 1,000 mL (1,000 mLs Intravenous New Bag/Given 04/22/24 1633)  ipratropium-albuterol  (DUONEB) 0.5-2.5 (3) MG/3ML nebulizer solution 3 mL (3 mLs Nebulization Given 04/22/24 1710)    Clinical Course as of 04/22/24 1940  Sat Apr 22, 2024  1939 Patient's labs are reassuring.  Vital signs are stable.  When I went to assess the patient, she was sleepy.  I was able to arouse her with sternal rub.  Patient however likely has effects from the Benadryl  she received for her headache.  Reassuring results discussed with the patient.  Given her sleepiness, we will initiate p.o. challenge and then ensure she is able to ambulate. [AN]    Clinical Course User Index [AN] Charlyn Sora, MD   {Click here for ABCD2, HEART and other calculators REFRESH Note before signing:1}                              Medical Decision Making Amount and/or Complexity of Data Reviewed Labs: ordered. Radiology: ordered.  Risk Prescription drug management.   33 year old patient comes in with chief complaint of fever, cough, congestion, body aches, headaches.  She also now has mild sore throat.  She has been feeling unwell for the last almost 2 weeks now.  She was seen in the ER recently and had cardiac troponins, flu/COVID test, x-ray and basic labs which were  reassuring.  She is returning to the ER because she is not better and reports that she had fever of 102 at home.  On exam, patient noted to have no tachycardia or tachypnea.  O2 sats on room air 100%.  Lung exam is clear.  Elevated BMI does limit the exam, but there is no clear evidence of any wheezing, rhonchi or rales.  Patient is also having chest discomfort and shortness of breath.  Differential diagnosis for this patient that was considered includes ACS, CHF, pulmonary edema, pleural effusion, PE, URI, superimposed bacterial pneumonia, mono, anemia.     Final diagnoses:  None    ED Discharge Orders     None

## 2024-04-24 ENCOUNTER — Encounter: Payer: Self-pay | Admitting: Neurology

## 2024-06-01 ENCOUNTER — Inpatient Hospital Stay (INDEPENDENT_AMBULATORY_CARE_PROVIDER_SITE_OTHER): Admitting: Primary Care

## 2024-06-28 ENCOUNTER — Telehealth (INDEPENDENT_AMBULATORY_CARE_PROVIDER_SITE_OTHER): Payer: Self-pay | Admitting: Primary Care

## 2024-06-28 NOTE — Telephone Encounter (Signed)
 Spoke to pt about upcoming appt.. Will be present

## 2024-07-07 ENCOUNTER — Telehealth: Payer: Self-pay | Admitting: Primary Care

## 2024-07-07 NOTE — Telephone Encounter (Signed)
 07/07/24 Patient aware

## 2024-07-10 ENCOUNTER — Ambulatory Visit: Payer: Self-pay | Admitting: Primary Care

## 2024-07-10 ENCOUNTER — Telehealth: Payer: Self-pay

## 2024-07-10 NOTE — Telephone Encounter (Signed)
 Contacted pt in regards to appointment today making pt aware that appointment will be virtual due to weather but if she's not able to do virtual I will reach out to her tomorrow to reschedule for a in-person visit lvm

## 2024-07-10 NOTE — Telephone Encounter (Signed)
 Patient phone is not in order and did not answer for virtual visit.

## 2024-08-07 ENCOUNTER — Ambulatory Visit: Admitting: Neurology

## 2024-09-07 ENCOUNTER — Encounter (INDEPENDENT_AMBULATORY_CARE_PROVIDER_SITE_OTHER): Payer: Self-pay | Admitting: Primary Care
